# Patient Record
Sex: Male | Born: 1975
Health system: Southern US, Community
[De-identification: ages and names within clinical notes are randomized; demographics above are authoritative.]

## PROBLEM LIST (undated history)

## (undated) DIAGNOSIS — Z87442 Personal history of urinary calculi: Secondary | ICD-10-CM

## (undated) DIAGNOSIS — K219 Gastro-esophageal reflux disease without esophagitis: Secondary | ICD-10-CM

## (undated) DIAGNOSIS — E119 Type 2 diabetes mellitus without complications: Secondary | ICD-10-CM

## (undated) DIAGNOSIS — I10 Essential (primary) hypertension: Secondary | ICD-10-CM

## (undated) DIAGNOSIS — I471 Supraventricular tachycardia: Secondary | ICD-10-CM

## (undated) DIAGNOSIS — I2510 Atherosclerotic heart disease of native coronary artery without angina pectoris: Secondary | ICD-10-CM

## (undated) DIAGNOSIS — E785 Hyperlipidemia, unspecified: Secondary | ICD-10-CM

## (undated) HISTORY — PX: CARDIAC SURGERY: SHX584

## (undated) HISTORY — PX: LAPAROSCOPIC CHOLECYSTECTOMY: SUR755

---

## 2001-10-26 ENCOUNTER — Emergency Department (HOSPITAL_COMMUNITY): Admission: EM | Admit: 2001-10-26 | Discharge: 2001-10-26 | Payer: Self-pay | Admitting: Emergency Medicine

## 2005-07-03 ENCOUNTER — Emergency Department (HOSPITAL_COMMUNITY): Admission: EM | Admit: 2005-07-03 | Discharge: 2005-07-03 | Payer: Self-pay | Admitting: Emergency Medicine

## 2009-10-04 ENCOUNTER — Emergency Department (HOSPITAL_COMMUNITY): Admission: EM | Admit: 2009-10-04 | Discharge: 2009-10-04 | Payer: Self-pay | Admitting: Emergency Medicine

## 2010-10-08 ENCOUNTER — Emergency Department (HOSPITAL_COMMUNITY)
Admission: EM | Admit: 2010-10-08 | Discharge: 2010-10-08 | Payer: Self-pay | Source: Home / Self Care | Admitting: Emergency Medicine

## 2010-12-16 LAB — URINALYSIS, ROUTINE W REFLEX MICROSCOPIC
Nitrite: NEGATIVE
Specific Gravity, Urine: >1.03 — ABNORMAL HIGH (ref 1.005–1.030)
Urobilinogen, UA: 0.2 mg/dL (ref 0.0–1.0)

## 2010-12-16 LAB — URINE MICROSCOPIC-ADD ON

## 2011-11-30 ENCOUNTER — Encounter (HOSPITAL_COMMUNITY): Payer: Self-pay

## 2011-11-30 ENCOUNTER — Other Ambulatory Visit: Payer: Self-pay

## 2011-11-30 ENCOUNTER — Emergency Department (HOSPITAL_COMMUNITY)
Admission: EM | Admit: 2011-11-30 | Discharge: 2011-11-30 | Disposition: A | Discharge status: Home / Self Care | Payer: Self-pay | Attending: Emergency Medicine | Admitting: Emergency Medicine

## 2011-11-30 DIAGNOSIS — K0889 Other specified disorders of teeth and supporting structures: Secondary | ICD-10-CM

## 2011-11-30 DIAGNOSIS — I10 Essential (primary) hypertension: Secondary | ICD-10-CM | POA: Insufficient documentation

## 2011-11-30 DIAGNOSIS — R079 Chest pain, unspecified: Secondary | ICD-10-CM | POA: Insufficient documentation

## 2011-11-30 DIAGNOSIS — I498 Other specified cardiac arrhythmias: Secondary | ICD-10-CM | POA: Insufficient documentation

## 2011-11-30 DIAGNOSIS — I471 Supraventricular tachycardia: Secondary | ICD-10-CM

## 2011-11-30 DIAGNOSIS — R002 Palpitations: Secondary | ICD-10-CM | POA: Insufficient documentation

## 2011-11-30 DIAGNOSIS — K089 Disorder of teeth and supporting structures, unspecified: Secondary | ICD-10-CM | POA: Insufficient documentation

## 2011-11-30 DIAGNOSIS — F17200 Nicotine dependence, unspecified, uncomplicated: Secondary | ICD-10-CM | POA: Insufficient documentation

## 2011-11-30 LAB — DIFFERENTIAL
Basophils Absolute: 0 10*3/uL (ref 0.0–0.1)
Basophils Relative: 0 % (ref 0–1)
Monocytes Absolute: 1.2 10*3/uL — ABNORMAL HIGH (ref 0.1–1.0)

## 2011-11-30 LAB — CBC
MCH: 30.6 pg (ref 26.0–34.0)
RBC: 5.55 MIL/uL (ref 4.22–5.81)
RDW: 13.1 % (ref 11.5–15.5)

## 2011-11-30 LAB — BASIC METABOLIC PANEL
BUN: 14 mg/dL (ref 6–23)
CO2: 27 mEq/L (ref 19–32)
Calcium: 10 mg/dL (ref 8.4–10.5)
Chloride: 101 mEq/L (ref 96–112)
Creatinine, Ser: 1.12 mg/dL (ref 0.50–1.35)
Glucose, Bld: 136 mg/dL — ABNORMAL HIGH (ref 70–99)
Potassium: 4 mEq/L (ref 3.5–5.1)

## 2011-11-30 LAB — TSH: TSH: 2.23 u[IU]/mL (ref 0.350–4.500)

## 2011-11-30 LAB — POCT I-STAT TROPONIN I: Troponin i, poc: 0.01 ng/mL (ref 0.00–0.08)

## 2011-11-30 MED ORDER — ADENOSINE 6 MG/2ML IV SOLN
6.0000 mg | Freq: Once | INTRAVENOUS | Status: AC
  Administered 2011-11-30: 6 mg via INTRAVENOUS

## 2011-11-30 MED ORDER — HYDROCODONE-ACETAMINOPHEN 5-500 MG PO TABS: 1.0000 | ORAL_TABLET | Freq: Four times a day (QID) | ORAL | Status: AC | PRN

## 2011-11-30 MED ORDER — ERYTHROMYCIN BASE 250 MG PO TABS: 500.0000 mg | ORAL_TABLET | Freq: Two times a day (BID) | ORAL | Status: AC

## 2011-11-30 MED ORDER — ADENOSINE 6 MG/2ML IV SOLN
INTRAVENOUS | Status: AC
  Filled 2011-11-30: qty 4

## 2011-11-30 MED ORDER — ADENOSINE 6 MG/2ML IV SOLN
INTRAVENOUS | Status: AC
  Filled 2011-11-30: qty 2

## 2011-11-30 MED ORDER — ADENOSINE 6 MG/2ML IV SOLN
12.0000 mg | Freq: Once | INTRAVENOUS | Status: AC
  Administered 2011-11-30: 12 mg via INTRAVENOUS

## 2011-11-30 NOTE — ED Provider Notes (Signed)
History   This chart was scribed for Geoffery Lyons, MD by Clarita Crane. The patient was seen in room APA04/APA04. Patient's care was started at [phone].    CSN: [id number]  Arrival date & time 11/30/11  1191   First MD Initiated Contact with Patient 11/30/11 [phone]      Chief Complaint  Patient presents with  . Chest Pain    (Consider location/radiation/quality/duration/timing/severity/associated sxs/prior treatment) HPI Lance [name] is a 36 y.o. male who presents to the Emergency Department complaining of constant moderate to severe palpitations onset this morning upon awaking and persistent since with no associated symptoms. Denies previous history of similar symptoms. Denies chest pain, SOB, diaphoresis, nausea, vomiting, fever. Denies history of arrythmia and previous cardiac problems.  Past Medical History  Diagnosis Date  . Hypertension     History reviewed. No pertinent past surgical history.  No family history on file.  History  Substance Use Topics  . Smoking status: Current Everyday Smoker    Types: Cigarettes  . Smokeless tobacco: Not on file  . Alcohol Use: No      Review of Systems 10 Systems reviewed and are negative for acute change except as noted in the HPI.  Allergies  Penicillins  Home Medications  No current outpatient prescriptions on file.  BP 204/114  Pulse 161  Temp(Src) 98.4 F (36.9 C) (Oral)  Resp 22  Ht 5\' 11"  (1.803 m)  Wt 265 lb (120.203 kg)  BMI 36.96 kg/m2  SpO2 99%  Physical Exam  Nursing note and vitals reviewed. Constitutional: He is oriented to person, place, and time. He appears well-developed and well-nourished. No distress.  HENT:  Head: Normocephalic and atraumatic.  Eyes: EOM are normal. Pupils are equal, round, and reactive to light.  Neck: Neck supple. No tracheal deviation present.  Cardiovascular: Regular rhythm.  Tachycardia present.  Exam reveals no gallop and no friction rub.   No murmur  heard. Pulmonary/Chest: Effort normal. No respiratory distress. He has no wheezes. He has no rales.  Abdominal: Soft. He exhibits no distension.  Musculoskeletal: Normal range of motion. He exhibits no edema.  Neurological: He is alert and oriented to person, place, and time. No sensory deficit.  Skin: Skin is warm and dry.  Psychiatric: He has a normal mood and affect. His behavior is normal.    ED Course  Procedures (including critical care time)  DIAGNOSTIC STUDIES: Oxygen Saturation is 98% on room air, normal by my interpretation.    COORDINATION OF CARE: 7:02AM- 1st dose off adenosine-6mg  administered. Patient still presents in SVT following administration.  7:06AM- Second dose of Adenosine (12mg ) administered. Patient's heart rate decreased following administration.   Results for orders placed during the hospital encounter of 11/30/11  CBC      Component Value Range   WBC 14.4 (*) 4.0 - 10.5 (K/uL)   RBC 5.55  4.22 - 5.81 (MIL/uL)   Hemoglobin 17.0  13.0 - 17.0 (g/dL)   HCT 95.6  21.3 - 08.6 (%)   MCV 88.6  78.0 - 100.0 (fL)   MCH 30.6  26.0 - 34.0 (pg)   MCHC 34.6  30.0 - 36.0 (g/dL)   RDW 57.8  46.9 - 62.9 (%)   Platelets 263  150 - 400 (K/uL)  DIFFERENTIAL      Component Value Range   Neutrophils Relative 54  43 - 77 (%)   Lymphocytes Relative 34  12 - 46 (%)   Monocytes Relative 8  3 - 12 (%)  Eosinophils Relative 4  0 - 5 (%)   Basophils Relative 0  0 - 1 (%)   Neutro Abs 7.7  1.7 - 7.7 (K/uL)   Lymphs Abs 4.9 (*) 0.7 - 4.0 (K/uL)   Monocytes Absolute 1.2 (*) 0.1 - 1.0 (K/uL)   Eosinophils Absolute 0.6  0.0 - 0.7 (K/uL)   Basophils Absolute 0.0  0.0 - 0.1 (K/uL)   WBC Morphology WHITE COUNT CONFIRMED ON SMEAR    BASIC METABOLIC PANEL      Component Value Range   Sodium 139  135 - 145 (mEq/L)   Potassium 4.0  3.5 - 5.1 (mEq/L)   Chloride 101  96 - 112 (mEq/L)   CO2 27  19 - 32 (mEq/L)   Glucose, Bld 136 (*) 70 - 99 (mg/dL)   BUN 14  6 - 23 (mg/dL)    Creatinine, Ser 1.47  0.50 - 1.35 (mg/dL)   Calcium 82.9  8.4 - 10.5 (mg/dL)   GFR calc non Af Amer 84 (*) >90 (mL/min)   GFR calc Af Amer >90  >90 (mL/min)  POCT I-STAT TROPONIN I      Component Value Range   Troponin i, poc 0.01  0.00 - 0.08 (ng/mL)   Comment 3             No results found.   1. Supraventricular tachycardia       MDM  The patient presents to the ED with palpitations and was found to be in SVT.  He converted to nsr with a second dose of adenosine.  His labs returned unremarkable and was feeling better.  He remained in a sinus rhythm and will be discharged to home with cardiology follow up.  He reports to me that he drinks sweet tea and soda all day long and has been eating multiple chocolate bars which he tells me is a remedy for a toothache he has been having.  I have advised him to watch his caffeine intake.  CRITICAL CARE Performed by: Geoffery Lyons   Total critical care time: 30 minutes  Critical care time was exclusive of separately billable procedures and treating other patients.  Critical care was necessary to treat or prevent imminent or life-threatening deterioration.  Critical care was time spent personally by me on the following activities: development of treatment plan with patient and/or surrogate as well as nursing, discussions with consultants, evaluation of patient's response to treatment, examination of patient, obtaining history from patient or surrogate, ordering and performing treatments and interventions, ordering and review of laboratory studies, ordering and review of radiographic studies, pulse oximetry and re-evaluation of patient's condition.   I personally performed the services described in this documentation, which was scribed in my presence. The recorded information has been reviewed and considered.     Geoffery Lyons, MD 12/01/11 [phone]

## 2011-11-30 NOTE — ED Notes (Signed)
Pt stated he has been eating a lot of candy lately for multiple toothaches, this am around 6 he woke and felt heart racing, pt placed on monitor and hr 160's noted.  md to bedside for eval.

## 2011-11-30 NOTE — ED Notes (Signed)
Pt now resting in bed, voices no complaints, hr 99 after meds

## 2011-11-30 NOTE — ED Notes (Signed)
Pt reports heart racing/?cp that started this am,  Denies any n/v, describes as "heart racing"

## 2011-11-30 NOTE — Discharge Instructions (Signed)
Supraventricular Tachycardia    Supraventricular tachycardia (SVT) is an abnormal heart rhythm (arrhythmia) that causes the heart to beat very fast (tachycardia). This kind of fast heart beat originates in the upper chambers of the heart (atria). SVT can cause the heart to beat greater than 100 beats per minute. SVT can have a rapid burst of heartbeats. This can start and stop suddenly without warning and is called non-sustained. SVT can also be sustained, in which the heart beats at a continuous fast rate.   CAUSES   There can be different causes of SVT. Some of these include:  · Heart valve problems such as mitral valve prolapse.   · An enlarged heart (hypertrophic cardiomyopathy).   · Congenital heart problems.   · Heart inflammation (pericarditis).   · Hyperthyroidism.   · Low potassium or magnesium levels.   · Caffeine.   · Drug use such as cocaine, methamphetamines, or stimulants.   · Some over-the-counter medications such as:   · Decongestants.   · Diet medications.   · Herbal medications.   SYMPTOMS   Symptoms of SVT can vary. Symptoms depend on if the SVT is sustained or non-sustained. These can include:  · No symptoms (asymptomatic).   · An awareness of your heart beating rapidly (palpitations).   · Shortness of breath.   · Chest pain or pressure.   · If your blood pressure drops because of the SVT, you may experience:   · Fainting or near fainting.   · Weakness.   · Dizziness.   DIAGNOSIS   Different tests can be performed to diagnose SVT, such as:  · An electrocardiogram (EKG) is a painless test that records the electrical activity of your heart.   · Holter monitor. This is a 24 hour recording of your heart rhythm. You will be given a diary. Write down all symptoms that you have and what you were doing at the time you experienced symptoms.   · Arrhythmia monitor. This is a small device that your wear for several weeks. It records the heart rhythm when you have symptoms.   · Echocardiogram. This is an  imaging test to help detect abnormal heart structure such as congenital abnormalities, heart valve problems, or if your heart is enlarged.   · Stress Test. This test can help determine if the SVT is related to exercise.   · Electrophysiology Study (EPS). This is a procedure that evaluates your heart's electrical system and can help your caregiver find the cause of SVT.   TREATMENT   Treatment of SVT depends on the symptoms, how often it recurs and whether there are any underlying heart problems.   · If symptoms are rare and no other cardiac disease is present, no treatment may be needed.   · Blood work may be done to check potassium, magnesium and thyroid hormone levels to see if they are abnormal. If these levels are abnormal, treatment to correct the problems will occur.   Medications:  Your caregiver may use oral medications to treat SVT. These medications are given for long-term control of SVT. Medications may be used alone or in combination with other treatments. These medications work to slow nerve impulses in the heart muscle. These medications can also be used to treat high blood pressure. Some of these medications may include:  · Calcium channel blockers.   · Beta blockers.   · Lanoxin.   Nonsurgical procedures:  Nonsurgical techniques may be used if oral medications do not work. Some   examples include:  · Cardioversion. This technique uses either drugs or an electrical shock to restore a normal heart rhythm.   · Cardioversion drugs may be given through an intravenous (IV) line to help "reset" the heart rhythm.   · In electrical cardio version, the doctor shocks your heart to stop its beat for a split second. This helps to reset the heart to a normal rhythm.   · Ablation. This procedure is done under mild sedation. High frequency radio-wave energy is used to destroy the area of heart tissue responsible for the SVT.   HOME CARE INSTRUCTIONS   · Do not smoke.   · Only take medications prescribed by your  caregiver. Check with your caregiver before using over-the-counter medications.   · Check with your caregiver as to how much alcohol and caffeine (coffee, tea, colas, or chocolate) you may have.   · It is very important to keep all follow-up referrals and appointments in order to properly manage this problem.   SEEK IMMEDIATE MEDICAL CARE IF:  · You have dizziness.   · You faint or nearly faint.   · You have shortness of breath.   · You have chest pain or pressure.   · You have sudden nausea or vomiting.   · You have profuse sweating.   · You are concerned about how long your symptoms last.   · You are concerned about the frequency of your SVT episodes.   If you have the above symptoms, call your local emergency service immediately! Do not drive yourself to the hospital.  MAKE SURE YOU:   · Understand these instructions.   · Will watch your condition.   · Will get help right away if you are not doing well or get worse.   Document Released: 09/22/2005 Document Revised: 04/07/2011 Document Reviewed: 01/04/2009  [employer]® Patient Information ©2012 [employer].

## 2012-01-26 ENCOUNTER — Encounter (HOSPITAL_COMMUNITY): Payer: Self-pay | Admitting: *Deleted

## 2012-01-26 ENCOUNTER — Emergency Department (HOSPITAL_COMMUNITY): Payer: Self-pay

## 2012-01-26 ENCOUNTER — Emergency Department (HOSPITAL_COMMUNITY)
Admission: EM | Admit: 2012-01-26 | Discharge: 2012-01-26 | Disposition: A | Discharge status: Home / Self Care | Payer: Self-pay | Attending: Emergency Medicine | Admitting: Emergency Medicine

## 2012-01-26 DIAGNOSIS — H53149 Visual discomfort, unspecified: Secondary | ICD-10-CM | POA: Insufficient documentation

## 2012-01-26 DIAGNOSIS — R51 Headache: Secondary | ICD-10-CM | POA: Insufficient documentation

## 2012-01-26 DIAGNOSIS — J329 Chronic sinusitis, unspecified: Secondary | ICD-10-CM | POA: Insufficient documentation

## 2012-01-26 DIAGNOSIS — I10 Essential (primary) hypertension: Secondary | ICD-10-CM | POA: Insufficient documentation

## 2012-01-26 DIAGNOSIS — R0602 Shortness of breath: Secondary | ICD-10-CM | POA: Insufficient documentation

## 2012-01-26 DIAGNOSIS — R112 Nausea with vomiting, unspecified: Secondary | ICD-10-CM | POA: Insufficient documentation

## 2012-01-26 DIAGNOSIS — H538 Other visual disturbances: Secondary | ICD-10-CM | POA: Insufficient documentation

## 2012-01-26 LAB — CBC
Hemoglobin: 15.7 g/dL (ref 13.0–17.0)
MCH: 30.3 pg (ref 26.0–34.0)
MCV: 89.2 fL (ref 78.0–100.0)
RBC: 5.18 MIL/uL (ref 4.22–5.81)
RDW: 13 % (ref 11.5–15.5)
WBC: 14.1 10*3/uL — ABNORMAL HIGH (ref 4.0–10.5)

## 2012-01-26 LAB — BASIC METABOLIC PANEL
BUN: 13 mg/dL (ref 6–23)
CO2: 31 mEq/L (ref 19–32)
GFR calc Af Amer: >90 mL/min (ref >90)
GFR calc non Af Amer: 88 mL/min — ABNORMAL LOW (ref >90)
Potassium: 4.3 mEq/L (ref 3.5–5.1)
Sodium: 137 mEq/L (ref 135–145)

## 2012-01-26 MED ORDER — SODIUM CHLORIDE 0.9 % IV BOLUS (SEPSIS)
250.0000 mL | Freq: Once | INTRAVENOUS | Status: AC
  Administered 2012-01-26: 250 mL via INTRAVENOUS

## 2012-01-26 MED ORDER — DIPHENHYDRAMINE HCL 50 MG/ML IJ SOLN
25.0000 mg | Freq: Once | INTRAMUSCULAR | Status: AC
  Administered 2012-01-26: 25 mg via INTRAVENOUS
  Filled 2012-01-26: qty 1

## 2012-01-26 MED ORDER — DEXAMETHASONE SODIUM PHOSPHATE 4 MG/ML IJ SOLN
10.0000 mg | Freq: Once | INTRAMUSCULAR | Status: AC
  Administered 2012-01-26: 10 mg via INTRAVENOUS
  Filled 2012-01-26: qty 3

## 2012-01-26 MED ORDER — SODIUM CHLORIDE 0.9 % IV SOLN: INTRAVENOUS | Status: DC

## 2012-01-26 MED ORDER — PROMETHAZINE HCL 25 MG/ML IJ SOLN
12.5000 mg | Freq: Once | INTRAMUSCULAR | Status: AC
  Administered 2012-01-26: 12.5 mg via INTRAVENOUS
  Filled 2012-01-26: qty 1

## 2012-01-26 NOTE — ED Notes (Signed)
Headache, no injury,  Had blurred vision from 830 am to app 12 noon,

## 2012-01-26 NOTE — Discharge Instructions (Signed)
Resource guide provided to help you find a primary care Dr. Here your blood pressure was slightly elevated when you're feeling better it important to have it rechecked. Particularly since she had a past history of hypertension and was supposed to be on medication many years ago. If headache recurs or gets worse or new symptoms develop they will be important for you to have an MRI we are able to do that here Monday through Friday during normal working hours if necessary.  Return for any new or worse symptoms.   RESOURCE GUIDE  Dental Problems  Patients with Medicaid: [facility] [phone] [address]                                           [phone] [address] Phone:  [phone]                                                  Phone:  [phone]  If unable to pay or uninsured, contact:  [facility] or [facility]. to become qualified for the adult dental clinic.  Chronic Pain Problems Contact [facility]  [phone] Patients need to be referred by their primary care doctor.  Insufficient Money for Medicine Contact [employer]:  call "211" or [facility] 3235507018.  No Primary Care Doctor Call [facility]  [phone] Other agencies that provide inexpensive medical care    [facility]  [phone]    [facility]  [phone]    [facility]  [phone]    [facility]  [phone]    [facility]  [phone]    [facility]  [phone]  Psychological Services [facility]  [phone] [facility]  [phone] [facility]   6064533769 (emergency services [phone])  Substance Abuse Resources [facility]  [phone] [facility] [phone] [facility] [place] [phone] Floydene Flock [phone] Residential & Outpatient Substance Abuse Program  [phone]  Abuse/Neglect [facility] Child Abuse Hotline 629-240-7466 [facility] Child Abuse Hotline 340-763-3775 (After Hours)  Emergency Shelter [facility] ([phone]  [facility] [place] [facility] [place] 5042654406 [facility] 8202120867  MRSA Hotline #:   612-137-8522    [facility] Resources  [facility] [place]     [employer]                          [facility] Dept. [address] [place]                       [address]      [address]  Patrecia Pace  [facility] Phone:  [phone]                                   Phone:  [phone]                 Phone:  [phone]  [facility] Phone:  [phone]  [facility] Child Abuse Hotline 425-184-4771 [phone] (After Hours)

## 2012-01-26 NOTE — ED Provider Notes (Signed)
History   This chart was scribed for Lance Jakes, MD by Clarita Crane. The patient was seen in room APFT20/APFT20. Patient's care was started at 1346.    CSN: [id number]  Arrival date & time 01/26/12  1346   First MD Initiated Contact with Patient 01/26/12 1507      Chief Complaint  Patient presents with  . Headache    (Consider location/radiation/quality/duration/timing/severity/associated sxs/prior treatment) HPI Lance [name] is a 36 y.o. male who presents to the Emergency Department complaining of constant moderate to severe throbbing HA localized to region behind right eye onset 5 hours ago and persistent since with associated blurred vision from left eye, photophobia, a brief episode of SOB, nausea and vomiting x1. Patient rates HA an 8 out of 10 currently. Reports blurred vision started approximately 7 hours ago and lasted 2 hours before resolving with onset of HA. Denies vomiting, fever, sore throat, chest pain, abdominal pain, back pain, cough, dysuria, swelling of lower extremities, diarrhea and head injury. Patient seasonal allergies, HTN and irregular heart beat and denies previous history of HAs and family h/o HAs.  No PCP  Past Medical History  Diagnosis Date  . Hypertension   . Irregular heart beat     History reviewed. No pertinent past surgical history.  History reviewed. No pertinent family history.  History  Substance Use Topics  . Smoking status: Current Everyday Smoker    Types: Cigarettes  . Smokeless tobacco: Not on file  . Alcohol Use: No      Review of Systems  Constitutional: Negative for fever and chills.  HENT: Negative for rhinorrhea and neck pain.   Eyes: Positive for photophobia and visual disturbance. Negative for pain.  Respiratory: Positive for shortness of breath. Negative for cough.   Cardiovascular: Negative for chest pain.  Gastrointestinal: Positive for nausea and vomiting. Negative for abdominal pain and diarrhea.    Genitourinary: Negative for dysuria.  Musculoskeletal: Negative for back pain.  Skin: Negative for rash.  Neurological: Positive for headaches. Negative for dizziness and weakness.    Allergies  Bee venom and Penicillins  Home Medications  No current outpatient prescriptions on file.  BP 159/92  Pulse 77  Temp(Src) 97.9 F (36.6 C) (Oral)  Resp 18  Ht 5\' 11"  (1.803 m)  Wt 240 lb (108.863 kg)  BMI 33.47 kg/m2  SpO2 100%  Physical Exam  Nursing note and vitals reviewed. Constitutional: He is oriented to person, place, and time. He appears well-developed and well-nourished. No distress.  HENT:  Head: Normocephalic and atraumatic.  Eyes: EOM are normal. Pupils are equal, round, and reactive to light.  Neck: Neck supple. No tracheal deviation present.  Cardiovascular: Normal rate and regular rhythm.  Exam reveals no gallop and no friction rub.   No murmur heard. Pulmonary/Chest: Effort normal. No respiratory distress. He has no wheezes. He has no rales.  Abdominal: Soft. Bowel sounds are normal. He exhibits no distension. There is no tenderness.  Musculoskeletal: Normal range of motion. He exhibits no edema.  Neurological: He is alert and oriented to person, place, and time. No cranial nerve deficit or sensory deficit.       No pronator drift. Upper extremity strength normal.  Skin: Skin is warm and dry.  Psychiatric: He has a normal mood and affect. His behavior is normal.    ED Course  Procedures (including critical care time)  DIAGNOSTIC STUDIES: Oxygen Saturation is 100% on room air, normal by my interpretation.    COORDINATION OF  CARE: 3:20PM- Patient informed of current plan for treatment and evaluation and agrees with plan at this time.  5:47PM- Patient reports he is feeling better at this time although HA still persists.      Labs Reviewed  CBC - Abnormal; Notable for the following:    WBC 14.1 (*)    All other components within normal limits  BASIC  METABOLIC PANEL - Abnormal; Notable for the following:    Glucose, Bld 115 (*)    GFR calc non Af Amer 88 (*)    All other components within normal limits   Ct Head Wo Contrast  01/26/2012  *RADIOLOGY REPORT*  Clinical Data: Headache.  Blurred vision.  CT HEAD WITHOUT CONTRAST  Technique:  Contiguous axial images were obtained from the base of the skull through the vertex without contrast.  Comparison: 07/03/2005  Findings: The brain has a normal appearance without evidence of malformation, atrophy, old or acute infarction, mass lesion, hemorrhage, hydrocephalus or extra-axial collection.  The calvarium is unremarkable.  There is chronic opacification of the left maxillary sinus.  There are inflammatory changes of the ethmoid and right maxillary sinus.  IMPRESSION: Normal appearance of the brain.  Inflammatory sinus disease.  Original Report Authenticated By: Thomasenia Sales, M.D.   Results for orders placed during the hospital encounter of 01/26/12  CBC      Component Value Range   WBC 14.1 (*) 4.0 - 10.5 (K/uL)   RBC 5.18  4.22 - 5.81 (MIL/uL)   Hemoglobin 15.7  13.0 - 17.0 (g/dL)   HCT 16.1  09.6 - 04.5 (%)   MCV 89.2  78.0 - 100.0 (fL)   MCH 30.3  26.0 - 34.0 (pg)   MCHC 34.0  30.0 - 36.0 (g/dL)   RDW 40.9  81.1 - 91.4 (%)   Platelets 185  150 - 400 (K/uL)  BASIC METABOLIC PANEL      Component Value Range   Sodium 137  135 - 145 (mEq/L)   Potassium 4.3  3.5 - 5.1 (mEq/L)   Chloride 99  96 - 112 (mEq/L)   CO2 31  19 - 32 (mEq/L)   Glucose, Bld 115 (*) 70 - 99 (mg/dL)   BUN 13  6 - 23 (mg/dL)   Creatinine, Ser 7.82  0.50 - 1.35 (mg/dL)   Calcium 9.9  8.4 - 95.6 (mg/dL)   GFR calc non Af Amer 88 (*) >90 (mL/min)   GFR calc Af Amer >90  >90 (mL/min)     1. Headache   2. Chronic sinusitis       MDM  Headaches responding to migraine cocktail however patient also does have 3 significant chronic sinusitis. Symptoms not consistent with acute subarachnoid hemorrhage. Will discharge  home with rest but the rest of migraine cocktail continue to work. Patient will need MRI if symptoms recur or persist. Also blood pressure here is mildly elevated at 159/92. Not significant at this point in time however patient states that he was told in the past he has hypertension need to be on medication this was about 6 years ago. Will have outpatient followup resource guide provided for him to find a primary care Dr.  At discharge patient snig. improved tolerating light fine moving around fine no acute distress nontoxic.      I personally performed the services described in this documentation, which was scribed in my presence. The recorded information has been reviewed and considered.     Lance Jakes, MD 01/26/12 [phone]

## 2012-01-26 NOTE — ED Notes (Signed)
Patient transported to CT. NAD noted. Meds given. IV fluids running.

## 2012-11-12 ENCOUNTER — Other Ambulatory Visit: Payer: Self-pay

## 2012-11-12 ENCOUNTER — Emergency Department (HOSPITAL_COMMUNITY)
Admission: EM | Admit: 2012-11-12 | Discharge: 2012-11-12 | Disposition: A | Discharge status: Home / Self Care | Payer: Self-pay | Attending: Emergency Medicine | Admitting: Emergency Medicine

## 2012-11-12 ENCOUNTER — Encounter (HOSPITAL_COMMUNITY): Payer: Self-pay | Admitting: *Deleted

## 2012-11-12 ENCOUNTER — Emergency Department (HOSPITAL_COMMUNITY): Payer: Self-pay

## 2012-11-12 DIAGNOSIS — F17200 Nicotine dependence, unspecified, uncomplicated: Secondary | ICD-10-CM | POA: Insufficient documentation

## 2012-11-12 DIAGNOSIS — R0789 Other chest pain: Secondary | ICD-10-CM | POA: Insufficient documentation

## 2012-11-12 DIAGNOSIS — H538 Other visual disturbances: Secondary | ICD-10-CM | POA: Insufficient documentation

## 2012-11-12 DIAGNOSIS — I10 Essential (primary) hypertension: Secondary | ICD-10-CM | POA: Insufficient documentation

## 2012-11-12 DIAGNOSIS — R0602 Shortness of breath: Secondary | ICD-10-CM | POA: Insufficient documentation

## 2012-11-12 DIAGNOSIS — Z8679 Personal history of other diseases of the circulatory system: Secondary | ICD-10-CM | POA: Insufficient documentation

## 2012-11-12 DIAGNOSIS — R079 Chest pain, unspecified: Secondary | ICD-10-CM

## 2012-11-12 LAB — CBC WITH DIFFERENTIAL/PLATELET
Eosinophils Absolute: 0.4 10*3/uL (ref 0.0–0.7)
Hemoglobin: 16.6 g/dL (ref 13.0–17.0)
Lymphocytes Relative: 37 % (ref 12–46)
Lymphs Abs: 4.8 10*3/uL — ABNORMAL HIGH (ref 0.7–4.0)
MCH: 31 pg (ref 26.0–34.0)
MCV: 88.2 fL (ref 78.0–100.0)
Monocytes Relative: 8 % (ref 3–12)
Neutrophils Relative %: 51 % (ref 43–77)
RBC: 5.36 MIL/uL (ref 4.22–5.81)
WBC: 12.8 10*3/uL — ABNORMAL HIGH (ref 4.0–10.5)

## 2012-11-12 LAB — COMPREHENSIVE METABOLIC PANEL
ALT: 35 U/L (ref 0–53)
Alkaline Phosphatase: 96 U/L (ref 39–117)
BUN: 14 mg/dL (ref 6–23)
CO2: 27 mEq/L (ref 19–32)
Chloride: 96 mEq/L (ref 96–112)
GFR calc Af Amer: >90 mL/min (ref >90)
GFR calc non Af Amer: 82 mL/min — ABNORMAL LOW (ref >90)
Glucose, Bld: 160 mg/dL — ABNORMAL HIGH (ref 70–99)
Potassium: 3.3 mEq/L — ABNORMAL LOW (ref 3.5–5.1)
Total Bilirubin: 0.3 mg/dL (ref 0.3–1.2)
Total Protein: 7.2 g/dL (ref 6.0–8.3)

## 2012-11-12 LAB — TROPONIN I: Troponin I: <0.3 ng/mL (ref <0.30)

## 2012-11-12 MED ORDER — METOPROLOL TARTRATE 50 MG PO TABS: 50.0000 mg | ORAL_TABLET | Freq: Every day | ORAL | Status: DC

## 2012-11-12 MED ORDER — ASPIRIN 325 MG PO TABS
325.0000 mg | ORAL_TABLET | Freq: Once | ORAL | Status: AC
  Administered 2012-11-12: 325 mg via ORAL
  Filled 2012-11-12: qty 1

## 2012-11-12 NOTE — ED Notes (Signed)
Chest "tightness" since 430.p.  Has not taken bp meds for 2 years.  Seen here recently for rapid heart rate.

## 2012-11-12 NOTE — ED Provider Notes (Signed)
History     CSN: [id number]  Arrival date & time 11/12/12  1918   First MD Initiated Contact with Patient 11/12/12 1943      Chief Complaint  Patient presents with  . Chest Pain    Patient is a 37 y.o. male presenting with chest pain. The history is provided by the patient.  Chest Pain The chest pain began 3 - 5 hours ago. Chest pain occurs constantly. The chest pain is resolved. Associated with: nothing. The severity of the pain is moderate. The quality of the pain is described as pressure-like. The pain does not radiate. Primary symptoms include shortness of breath. Pertinent negatives for primary symptoms include no fever, no syncope, no cough, no abdominal pain and no vomiting.  Pertinent negatives for associated symptoms include no diaphoresis and no weakness. He tried nothing for the symptoms.   pt reports that after work he started to have blurred vision (he reports this will occur with his HTN) but he denies visual loss or floaters.  He reports soon after he developed chest tightness and SOB.  No diaphoresis/nausea.  He is now feeling improved. No back pain.  No abd pain.  No focal weakness.  He has no h/o CAD, but does have h/o HTN.  He also had episode of SVT previously but none recently.  No syncope is reported.  Family history - + for CAD Social history -smoker  Past Medical History  Diagnosis Date  . Hypertension   . Irregular heart beat     History reviewed. No pertinent past surgical history.  History reviewed. No pertinent family history.  History  Substance Use Topics  . Smoking status: Current Every Day Smoker    Types: Cigarettes  . Smokeless tobacco: Not on file  . Alcohol Use: No      Review of Systems  Constitutional: Negative for fever and diaphoresis.  Respiratory: Positive for shortness of breath. Negative for cough.   Cardiovascular: Positive for chest pain. Negative for syncope.  Gastrointestinal: Negative for vomiting and abdominal pain.   Musculoskeletal: Negative for back pain.  Neurological: Negative for syncope, facial asymmetry, weakness and headaches.  Psychiatric/Behavioral: Negative for agitation.  All other systems reviewed and are negative.    Allergies  Bee venom and Penicillins  Home Medications  No current outpatient prescriptions on file.  BP 192/99  Pulse 95  Temp 97.9 F (36.6 C) (Oral)  Resp 18  Ht 5\' 11"  (1.803 m)  Wt 245 lb (111.131 kg)  BMI 34.17 kg/m2  SpO2 98%  Physical Exam CONSTITUTIONAL: Well developed/well nourished, comfortable appearing, watching TV HEAD AND FACE: Normocephalic/atraumatic EYES: EOMI/PERRL ENMT: Mucous membranes moist NECK: supple no meningeal signs SPINE:entire spine nontender CV: S1/S2 noted, no murmurs/rubs/gallops noted LUNGS: Lungs are clear to auscultation bilaterally, no apparent distress ABDOMEN: soft, nontender, no rebound or guarding GU:no cva tenderness NEURO: Pt is awake/alert, moves all extremitiesx4 EXTREMITIES: pulses normal, full ROM, no calf tenderness SKIN: warm, color normal PSYCH: no abnormalities of mood noted  ED Course  Procedures   10:12 PM Pt with one episode of CP earlier tonight that is now resolved.  He has not had this recently.  He did have blurred vision earlier but reports that will occur with HTN which is now improved.  Repeat EKG is not completely normal, however no significant ischemic changes.  Doubt PE given his history.  I doubt aortic dissection  At signout to dr Effie Shy, plan is to repeat troponin at 2300, if negative will  be safe for d/c Pt reports he used to take lopressor, will restart this med  MDM  Nursing notes including past medical history and social history reviewed and considered in documentation xrays reviewed and considered Labs/vital reviewed and considered        Date: 11/12/2012  Rate: 76  Rhythm: normal sinus rhythm  QRS Axis: normal  Intervals: normal  ST/T Wave abnormalities: nonspecific  ST changes  Conduction Disutrbances:nonspecific intraventricular conduction delay  Narrative Interpretation:   Old EKG Reviewed: unchanged     Date: 11/12/2012 2148  Rate: 60  Rhythm: normal sinus rhythm  QRS Axis: normal  Intervals: normal  ST/T Wave abnormalities: nonspecific ST changes  Conduction Disutrbances:none  Narrative Interpretation:   Old EKG Reviewed: no significant change from earlier tonight    Joya Gaskins, MD 11/12/12 2214

## 2013-04-20 ENCOUNTER — Emergency Department (HOSPITAL_COMMUNITY)
Admission: EM | Admit: 2013-04-20 | Discharge: 2013-04-20 | Disposition: A | Discharge status: Home / Self Care | Payer: Self-pay | Attending: Emergency Medicine | Admitting: Emergency Medicine

## 2013-04-20 ENCOUNTER — Encounter (HOSPITAL_COMMUNITY): Payer: Self-pay | Admitting: *Deleted

## 2013-04-20 ENCOUNTER — Emergency Department (HOSPITAL_COMMUNITY): Payer: Self-pay

## 2013-04-20 DIAGNOSIS — Z8679 Personal history of other diseases of the circulatory system: Secondary | ICD-10-CM | POA: Insufficient documentation

## 2013-04-20 DIAGNOSIS — I10 Essential (primary) hypertension: Secondary | ICD-10-CM | POA: Insufficient documentation

## 2013-04-20 DIAGNOSIS — R072 Precordial pain: Secondary | ICD-10-CM | POA: Insufficient documentation

## 2013-04-20 DIAGNOSIS — Z880 Allergy status to penicillin: Secondary | ICD-10-CM | POA: Insufficient documentation

## 2013-04-20 DIAGNOSIS — R1013 Epigastric pain: Secondary | ICD-10-CM | POA: Insufficient documentation

## 2013-04-20 DIAGNOSIS — R110 Nausea: Secondary | ICD-10-CM | POA: Insufficient documentation

## 2013-04-20 DIAGNOSIS — F17200 Nicotine dependence, unspecified, uncomplicated: Secondary | ICD-10-CM | POA: Insufficient documentation

## 2013-04-20 LAB — COMPREHENSIVE METABOLIC PANEL
ALT: 36 U/L (ref 0–53)
Albumin: 3.8 g/dL (ref 3.5–5.2)
Alkaline Phosphatase: 88 U/L (ref 39–117)
Calcium: 9.4 mg/dL (ref 8.4–10.5)
GFR calc Af Amer: >90 mL/min (ref >90)
Potassium: 3.8 mEq/L (ref 3.5–5.1)
Sodium: 138 mEq/L (ref 135–145)
Total Protein: 6.9 g/dL (ref 6.0–8.3)

## 2013-04-20 LAB — CBC
MCH: 31.5 pg (ref 26.0–34.0)
MCHC: 35.4 g/dL (ref 30.0–36.0)
Platelets: 191 10*3/uL (ref 150–400)
RDW: 13 % (ref 11.5–15.5)

## 2013-04-20 MED ORDER — ONDANSETRON HCL 4 MG PO TABS: 4.0000 mg | ORAL_TABLET | Freq: Four times a day (QID) | ORAL | Status: DC

## 2013-04-20 MED ORDER — MORPHINE SULFATE 4 MG/ML IJ SOLN: 4.0000 mg | Freq: Once | INTRAMUSCULAR | Status: DC

## 2013-04-20 MED ORDER — FAMOTIDINE 20 MG PO TABS
20.0000 mg | ORAL_TABLET | Freq: Once | ORAL | Status: AC
  Administered 2013-04-20: 20 mg via ORAL
  Filled 2013-04-20: qty 1

## 2013-04-20 MED ORDER — OMEPRAZOLE 20 MG PO CPDR: DELAYED_RELEASE_CAPSULE | ORAL | Status: DC

## 2013-04-20 MED ORDER — ONDANSETRON HCL 4 MG/2ML IJ SOLN: 4.0000 mg | Freq: Once | INTRAMUSCULAR | Status: DC

## 2013-04-20 MED ORDER — GI COCKTAIL ~~LOC~~
30.0000 mL | Freq: Once | ORAL | Status: AC
  Administered 2013-04-20: 30 mL via ORAL
  Filled 2013-04-20: qty 30

## 2013-04-20 NOTE — ED Notes (Signed)
Pt states he noticed he was a little short of breath earlier at work. No distress at this time. Pt states it has since resolved.

## 2013-04-20 NOTE — ED Provider Notes (Signed)
History  This chart was scribed for Ward Givens, MD by Bennett Scrape, ED Scribe. This patient was seen in room APA01/[facility]01 and the patient's care was started at 2:09 PM.  CSN: [id number] Arrival date & time 04/20/13  1252  First MD Initiated Contact with Patient 04/20/13 1331     Chief Complaint  Patient presents with  . Abdominal Pain  . chest discomfort     Patient is a 37 y.o. male presenting with abdominal pain. The history is provided by the patient. No language interpreter was used.  Abdominal Pain This is a recurrent problem. The current episode started more than 1 week ago. The problem occurs constantly. Progression since onset: waxing and waning. Associated symptoms include abdominal pain. Pertinent negatives include no shortness of breath. Nothing aggravates the symptoms. Nothing relieves the symptoms. He has tried nothing for the symptoms.    HPI Comments: Lance [name] is a 37 y.o. male who presents to the Emergency Department complaining of 1.5 - 2 weeks of waxing and waning epigastric abdominal pain described as pressure that radiates into the RUQ and substernal chest. He rates his pain a 5 out of 10 currently and an 8 or 9 out of 10 at its worst. Last episode of severe pain was this morning. He reports one episode of nausea while eating chicken and dumplings today but otherwise denies nausea. He denies any modifying factors. He reports having recurrent episodes of pain in his RUQ for the past 6 months with episodes occuring weeks in between. He has a h/o kidney stones and denies similarities. He reports a h/o cholecystectomies with his mother, brother and sister. He denies emesis, fever, chills as an associated symptom.   He has a h/o HTN and admits that he has been non-complaint with his HTN medication over the past few months. Pt is a current 0.5 ppd everyday smoker but denies alcohol use.  PCP none  Past Medical History  Diagnosis Date  . Hypertension   . Irregular  heart beat    History reviewed. No pertinent past surgical history. No family history on file. History  Substance Use Topics  . Smoking status: Current Every Day Smoker    Types: Cigarettes  . Smokeless tobacco: Not on file  . 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  Respiratory: Negative for shortness of breath.   Gastrointestinal: Positive for nausea and abdominal pain. Negative for vomiting.  All other systems reviewed and are negative.    Allergies  Bee venom and Penicillins  Home Medications   Current Outpatient Rx  Name  Route  Sig  Dispense  Refill  . ibuprofen (ADVIL,MOTRIN) 200 MG tablet   Oral   Take 400 mg by mouth every 6 (six) hours as needed for pain.          Triage Vitals: BP 180/81  Pulse 79  Temp(Src) 98.1 F (36.7 C) (Oral)  Resp 20  Ht 5\' 11"  (1.803 m)  Wt 245 lb (111.131 kg)  BMI 34.19 kg/m2  SpO2 99%  Vital signs normal except hypertension   Physical Exam  Nursing note and vitals reviewed. Constitutional: He is oriented to person, place, and time. He appears well-developed and well-nourished.  Non-toxic appearance. He does not appear ill. No distress.  HENT:  Head: Normocephalic and atraumatic.  Right Ear: External ear normal.  Left Ear: External ear normal.  Nose: Nose normal. No mucosal edema or rhinorrhea.  Mouth/Throat: Oropharynx is  clear and moist and mucous membranes are normal. No dental abscesses or edematous.  Eyes: Conjunctivae and EOM are normal. Pupils are equal, round, and reactive to light.  Neck: Normal range of motion and full passive range of motion without pain. Neck supple.  Cardiovascular: Normal rate, regular rhythm and normal heart sounds.  Exam reveals no gallop and no friction rub.   No murmur heard. Pulmonary/Chest: Effort normal and breath sounds normal. No respiratory distress. He has no wheezes. He has no rhonchi. He has no rales. He exhibits no tenderness and no  crepitus.  Abdominal: Soft. Normal appearance. He exhibits no distension. There is tenderness. There is no rebound and no guarding.    Diminished bowel sounds  Musculoskeletal: Normal range of motion. He exhibits no edema and no tenderness.  Moves all extremities well.   Neurological: He is alert and oriented to person, place, and time. He has normal strength. No cranial nerve deficit.  Skin: Skin is warm, dry and intact. No rash noted. No erythema. No pallor.  Psychiatric: He has a normal mood and affect. His speech is normal and behavior is normal. His mood appears not anxious.    ED Course  Procedures (including critical care time)  Medications  morphine 4 MG/ML injection 4 mg (4 mg Intravenous Not Given 04/20/13 1500)  ondansetron (ZOFRAN) injection 4 mg (4 mg Intravenous Not Given 04/20/13 1501)  gi cocktail (Maalox,Lidocaine,Donnatal) (30 mLs Oral Given 04/20/13 1540)  famotidine (PEPCID) tablet 20 mg (20 mg Oral Given 04/20/13 1540)     DIAGNOSTIC STUDIES: Oxygen Saturation is 99% on room air, normal by my interpretation.    COORDINATION OF CARE: 2:16 PM-Discussed treatment plan which includes labs and [place] with pt at bedside and pt agreed to plan.   15:00 nurse states patient refusing pain medications.  3:59 AM-Pt rechecked and still complains of mild pain after medications given above. Informed pt of normal labs and negative radiology results. Discussed minor changes on EKG that are unchanged from prior one. He denies having any symptoms similar to GERD such as esophogeal burning.Pt offered CT scan of his abdomen, but he would prefer to wait.  Discussed discharge plan of PPI medications. Advised pt to avoid spicy, fried foods. Pt is agreeable to this plan.   Results for orders placed during the hospital encounter of 04/20/13  CBC      Result Value Range   WBC 11.5 (*) 4.0 - 10.5 K/uL   RBC 4.95  4.22 - 5.81 MIL/uL   Hemoglobin 15.6  13.0 - 17.0 g/dL   HCT 16.1  09.6 - 04.5 %    MCV 89.1  78.0 - 100.0 fL   MCH 31.5  26.0 - 34.0 pg   MCHC 35.4  30.0 - 36.0 g/dL   RDW 40.9  81.1 - 91.4 %   Platelets 191  150 - 400 K/uL  COMPREHENSIVE METABOLIC PANEL      Result Value Range   Sodium 138  135 - 145 mEq/L   Potassium 3.8  3.5 - 5.1 mEq/L   Chloride 102  96 - 112 mEq/L   CO2 27  19 - 32 mEq/L   Glucose, Bld 120 (*) 70 - 99 mg/dL   BUN 14  6 - 23 mg/dL   Creatinine, Ser 7.82  0.50 - 1.35 mg/dL   Calcium 9.4  8.4 - 95.6 mg/dL   Total Protein 6.9  6.0 - 8.3 g/dL   Albumin 3.8  3.5 - 5.2 g/dL  AST 26  0 - 37 U/L   ALT 36  0 - 53 U/L   Alkaline Phosphatase 88  39 - 117 U/L   Total Bilirubin 0.4  0.3 - 1.2 mg/dL   GFR calc non Af Amer 85 (*) >90 mL/min   GFR calc Af Amer >90  >90 mL/min  TROPONIN I      Result Value Range   Troponin I <0.30  <0.30 ng/mL  LIPASE, BLOOD      Result Value Range   Lipase 23  11 - 59 U/L   Laboratory interpretation all normal except leukocytosis   Dg Chest 2 View  04/20/2013   *RADIOLOGY REPORT*  Clinical Data:  Chest pain and shortness of breath.  CHEST - 2 VIEW  Comparison: 11/12/2012  Findings: The heart size and mediastinal contours are within normal limits.  Both lungs are clear.  The visualized skeletal structures are unremarkable.  IMPRESSION: No active disease.   Original Report Authenticated By: Irish Lack, M.D.   US Abdomen Limited Ruq  04/20/2013   *RADIOLOGY REPORT*  Clinical Data: Right upper quadrant pain.  LIMITED ABDOMINAL ULTRASOUND  Comparison:  10/08/2010 CT.  Findings:  Gallbladder: No gallstones, gallbladder wall thickening or pericholecystic fluid.  Common bile duct:  3.6 mm.  Liver:  Top normal size.  Increased echogenicity suggestive of fatty infiltration.  No focal mass identified.  IMPRESSION: Fatty liver.  No gallstones detected.   Original Report Authenticated By: Lacy Duverney, M.D.     Date: 04/20/2013  Rate: 81  Rhythm: normal sinus rhythm  QRS Axis: normal  Intervals: QT prolonged  ST/T Wave  abnormalities: nonspecific T wave changes  Conduction Disutrbances:none  Narrative Interpretation:   Old EKG Reviewed: unchanged from 11/12/2012     1. Epigastric abdominal pain     New Prescriptions   OMEPRAZOLE (PRILOSEC) 20 MG CAPSULE    Take 1 or 2 po Q 6hrs for pain   ONDANSETRON (ZOFRAN) 4 MG TABLET    Take 1 tablet (4 mg total) by mouth every 6 (six) hours.    Plan discharge   Devoria Albe, MD, FACEP   MDM    I personally performed the services described in this documentation, which was scribed in my presence. The recorded information has been reviewed and considered.  Devoria Albe, MD, Armando Gang    Ward Givens, MD 04/20/13 [phone]

## 2013-04-20 NOTE — ED Notes (Signed)
Pt says he doesn't have a way home other than to drive himself.  Says he doesn't need any thing for pain or nausea at this time and doesn't want an IV unless absolutely necessary.  Notified Dr. Lynelle Doctor.

## 2013-04-20 NOTE — ED Notes (Signed)
Pt denies any n/v/d.

## 2013-04-20 NOTE — ED Notes (Signed)
Pt states pressure to epigastric are and right upper abdomen x 1 1/2 weeks. States pressure is now going into chest. States that when pressure to right abdomen gets better, so does the discomfort to epigastric and chest.

## 2013-04-21 ENCOUNTER — Encounter (INDEPENDENT_AMBULATORY_CARE_PROVIDER_SITE_OTHER): Payer: Self-pay | Admitting: Internal Medicine

## 2013-04-21 ENCOUNTER — Ambulatory Visit (INDEPENDENT_AMBULATORY_CARE_PROVIDER_SITE_OTHER): Payer: Self-pay | Admitting: Internal Medicine

## 2013-04-21 VITALS — BP 160/70 | HR 84 | Temp 97.0°F | Ht 71.0 in | Wt 264.9 lb

## 2013-04-21 DIAGNOSIS — R1013 Epigastric pain: Secondary | ICD-10-CM

## 2013-04-21 DIAGNOSIS — I10 Essential (primary) hypertension: Secondary | ICD-10-CM | POA: Insufficient documentation

## 2013-04-21 NOTE — Progress Notes (Addendum)
Subjective:     Patient ID: Lance [name], male   DOB: 05-25-1976, 37 y.o.   MRN: [id number]  HPI Presents today with c/o pressure from his epigastric region to his rt upper quadrant. When he lies down the pain will ease off. The pain is sporadic. One day he will have the pain and the next it will be gone. Pain/pressure started about 2 weeks. There was no injury associated with this.  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 He tells me he has had indigestion but this is not the same pain. He says this is not a kidney stone. He has had a kidney stone before. Pain does not occur after eating.  He has tenderness to his epigastric region. Appetite is good. No weight loss. He has a BM about every day. No melena or bright red rectal bleeding.  Current Outpatient Prescriptions  Medication Sig Dispense Refill  . ibuprofen (ADVIL,MOTRIN) 200 MG tablet Take 400 mg by mouth every 6 (six) hours as needed for pain.       No current facility-administered medications for this visit.   Past Medical History  Diagnosis Date  . Hypertension   . Irregular heart beat   CBC    Component Value Date/Time   WBC 11.5* 04/20/2013 1341   RBC 4.95 04/20/2013 1341   HGB 15.6 04/20/2013 1341   HCT 44.1 04/20/2013 1341   PLT 191 04/20/2013 1341   MCV 89.1 04/20/2013 1341   MCH 31.5 04/20/2013 1341   MCHC 35.4 04/20/2013 1341   RDW 13.0 04/20/2013 1341   LYMPHSABS 4.8* 11/12/2012 1955   MONOABS 1.0 11/12/2012 1955   EOSABS 0.4 11/12/2012 1955   BASOSABS 0.0 11/12/2012 1955   CMP     Component Value Date/Time   NA 138 04/20/2013 1341   K 3.8 04/20/2013 1341   CL 102 04/20/2013 1341   CO2 27 04/20/2013 1341   GLUCOSE 120* 04/20/2013 1341   BUN 14 04/20/2013 1341   CREATININE 1.09 04/20/2013 1341   CALCIUM 9.4 04/20/2013 1341   PROT 6.9 04/20/2013 1341   ALBUMIN 3.8 04/20/2013 1341   AST 26 04/20/2013 1341   ALT 36 04/20/2013 1341   ALKPHOS 88 04/20/2013 1341   BILITOT 0.4 04/20/2013 1341   GFRNONAA 85* 04/20/2013 1341   GFRAA >90 04/20/2013 1341  04/20/2013 Lip[place] 21.  04/20/2013 US abdomen:  Findings:  Gallbladder: No gallstones, gallbladder wall thickening or  pericholecystic fluid.  Common bile duct: 3.6 mm.  Liver: Top normal size. Increased echogenicity suggestive of  fatty infiltration. No focal mass identified.  IMPRESSION:  Fatty liver.  No gallstones detected.      Review of Systems see hpi      Objective:   Physical Exam  Filed Vitals:   04/21/13 1519  BP: 160/70  Pulse: 84  Temp: 97 F (36.1 C)  Height: 5\' 11"  (1.803 m)  Weight: 264 lb 14.4 oz (120.158 kg)  Alert and oriented. Skin warm and dry. Oral mucosa is moist.   . Sclera anicteric, conjunctivae is pink. Thyroid not enlarged. No cervical lymphadenopathy. Lungs clear. Heart regular rate and rhythm.  Abdomen is soft. Bowel sounds are positive. No hepatomegaly. No abdominal masses felt. Point tenderness to epigastric region and some tenderness rt upper abdomen lateral. .  No edema to lower extremities.        Assessment:    Epigastric pain ? Etiology. He occasionally has indigestion . Does not sound like PUD  or GB disease but probably needs to be ruledout.  Discussed with Dr. Karilyn Cota.     Plan:     H. Pylori. Double dose PPI. Come back in 2 weeks. HIDA scan.   Dexilant 60mg  30 minutes before breakfast and take the Omeprazole before going to bed.  If HIDA scan normal and H. Pylori normal: may need an EGD.

## 2013-04-21 NOTE — Patient Instructions (Signed)
H. Pylori  And HIDA Scan. OV in 2 weeks.

## 2013-04-22 LAB — H. PYLORI ANTIBODY, IGG: H Pylori IgG: <0.4 {ISR}

## 2013-04-26 ENCOUNTER — Encounter (HOSPITAL_COMMUNITY): Payer: Self-pay

## 2013-04-26 ENCOUNTER — Encounter (HOSPITAL_COMMUNITY)
Admission: RE | Admit: 2013-04-26 | Discharge: 2013-04-26 | Disposition: A | Discharge status: Home / Self Care | Payer: Self-pay | Source: Ambulatory Visit | Attending: Internal Medicine | Admitting: Internal Medicine

## 2013-04-26 DIAGNOSIS — R1013 Epigastric pain: Secondary | ICD-10-CM | POA: Insufficient documentation

## 2013-04-26 MED ORDER — TECHNETIUM TC 99M MEBROFENIN IV KIT
5.0000 | PACK | Freq: Once | INTRAVENOUS | Status: AC | PRN
  Administered 2013-04-26: 5 via INTRAVENOUS

## 2013-04-27 ENCOUNTER — Telehealth (INDEPENDENT_AMBULATORY_CARE_PROVIDER_SITE_OTHER): Payer: Self-pay | Admitting: Internal Medicine

## 2013-04-27 DIAGNOSIS — R948 Abnormal results of function studies of other organs and systems: Secondary | ICD-10-CM

## 2013-04-27 NOTE — Telephone Encounter (Signed)
[name], I did a referral to Dr. Malvin Johns. I'm not sure if u need this.

## 2013-04-27 NOTE — Telephone Encounter (Signed)
xzx

## 2013-04-28 NOTE — Telephone Encounter (Signed)
appt w/ Dr Malvin Johns 05/09/13 @ 300, patient aware

## 2013-05-05 ENCOUNTER — Ambulatory Visit (INDEPENDENT_AMBULATORY_CARE_PROVIDER_SITE_OTHER): Payer: Self-pay | Admitting: Internal Medicine

## 2013-10-06 DIAGNOSIS — I471 Supraventricular tachycardia, unspecified: Secondary | ICD-10-CM

## 2013-10-06 HISTORY — DX: Supraventricular tachycardia, unspecified: I47.10

## 2013-10-06 HISTORY — DX: Supraventricular tachycardia: I47.1

## 2013-10-12 ENCOUNTER — Encounter (HOSPITAL_COMMUNITY): Payer: Self-pay | Admitting: Emergency Medicine

## 2013-10-12 ENCOUNTER — Emergency Department (HOSPITAL_COMMUNITY)
Admission: EM | Admit: 2013-10-12 | Discharge: 2013-10-12 | Disposition: A | Discharge status: Home / Self Care | Payer: Self-pay | Attending: Emergency Medicine | Admitting: Emergency Medicine

## 2013-10-12 DIAGNOSIS — F17200 Nicotine dependence, unspecified, uncomplicated: Secondary | ICD-10-CM | POA: Insufficient documentation

## 2013-10-12 DIAGNOSIS — I471 Supraventricular tachycardia, unspecified: Secondary | ICD-10-CM

## 2013-10-12 DIAGNOSIS — I10 Essential (primary) hypertension: Secondary | ICD-10-CM | POA: Insufficient documentation

## 2013-10-12 DIAGNOSIS — I498 Other specified cardiac arrhythmias: Secondary | ICD-10-CM | POA: Insufficient documentation

## 2013-10-12 DIAGNOSIS — Z880 Allergy status to penicillin: Secondary | ICD-10-CM | POA: Insufficient documentation

## 2013-10-12 LAB — CBC
HCT: 49.4 % (ref 39.0–52.0)
HEMOGLOBIN: 16.9 g/dL (ref 13.0–17.0)
MCH: 30.7 pg (ref 26.0–34.0)
MCHC: 34.2 g/dL (ref 30.0–36.0)
MCV: 89.7 fL (ref 78.0–100.0)
PLATELETS: 191 10*3/uL (ref 150–400)
RBC: 5.51 MIL/uL (ref 4.22–5.81)
RDW: 13 % (ref 11.5–15.5)
WBC: 10.7 10*3/uL — AB (ref 4.0–10.5)

## 2013-10-12 LAB — COMPREHENSIVE METABOLIC PANEL
ALBUMIN: 4.1 g/dL (ref 3.5–5.2)
ALT: 37 U/L (ref 0–53)
AST: 26 U/L (ref 0–37)
Alkaline Phosphatase: 91 U/L (ref 39–117)
BILIRUBIN TOTAL: 0.8 mg/dL (ref 0.3–1.2)
BUN: 16 mg/dL (ref 6–23)
CHLORIDE: 102 meq/L (ref 96–112)
CO2: 26 mEq/L (ref 19–32)
CREATININE: 1.16 mg/dL (ref 0.50–1.35)
Calcium: 10 mg/dL (ref 8.4–10.5)
GFR calc Af Amer: >90 mL/min (ref >90)
GFR calc non Af Amer: 79 mL/min — ABNORMAL LOW (ref >90)
Glucose, Bld: 168 mg/dL — ABNORMAL HIGH (ref 70–99)
Potassium: 4.3 mEq/L (ref 3.7–5.3)
Sodium: 140 mEq/L (ref 137–147)
Total Protein: 7.5 g/dL (ref 6.0–8.3)

## 2013-10-12 LAB — TROPONIN I

## 2013-10-12 MED ORDER — ADENOSINE 6 MG/2ML IV SOLN
12.0000 mg | Freq: Once | INTRAVENOUS | Status: AC
  Administered 2013-10-12: 12 mg via INTRAVENOUS

## 2013-10-12 MED ORDER — ADENOSINE 6 MG/2ML IV SOLN
INTRAVENOUS | Status: AC
Start: 2013-10-12 — End: 2013-10-12
  Administered 2013-10-12: 12 mg via INTRAVENOUS
  Filled 2013-10-12: qty 6

## 2013-10-12 NOTE — Discharge Instructions (Signed)
Avoid caffeine and other stimulants. Followup with cardiology. Return immediately for palpitations, shortness of breath, chest pain or for any concerns  Supraventricular Tachycardia Supraventricular tachycardia (SVT) is an abnormal heart rhythm (arrhythmia) that causes the heart to beat very fast (tachycardia). This kind of fast heartbeat originates in the upper chambers of the heart (atria). SVT can cause the heart to beat greater than 100 beats per minute. SVT can have a rapid burst of heartbeats. This can start and stop suddenly without warning and is called nonsustained. SVT can also be sustained, in which the heart beats at a continuous fast rate.  CAUSES  There can be different causes of SVT. Some of these include:  Heart valve problems such as mitral valve prolapse.  An enlarged heart (hypertrophic cardiomyopathy).  Congenital heart problems.  Heart inflammation (pericarditis).  Hyperthyroidism.  Low potassium or magnesium levels.  Caffeine.  Drug use such as cocaine, methamphetamines, or stimulants.  Some over-the-counter medicines such as:  Decongestants.  Diet medicines.  Herbal medicines. SYMPTOMS  Symptoms of SVT can vary. Symptoms depend on whether the SVT is sustained or nonsustained. You may experience:  No symptoms (asymptomatic).  An awareness of your heart beating rapidly (palpitations).  Shortness of breath.  Chest pain or pressure. If your blood pressure drops because of the SVT, you may experience:  Fainting or near fainting.  Weakness.  Dizziness. DIAGNOSIS  Different tests can be performed to diagnose SVT, such as:  An electrocardiogram (EKG). This is a painless test that records the electrical activity of your heart.  Holter monitor. This is a 24 hour recording of your heart rhythm. You will be given a diary. Write down all symptoms that you have and what you were doing at the time you experienced symptoms.  Arrhythmia monitor. This is a  small device that your wear for several weeks. It records the heart rhythm when you have symptoms.  Echocardiogram. This is an imaging test to help detect abnormal heart structure such as congenital abnormalities, heart valve problems, or heart enlargement.  Stress test. This test can help determine if the SVT is related to exercise.  Electrophysiology study (EPS). This is a procedure that evaluates your heart's electrical system and can help your caregiver find the cause of your SVT. TREATMENT  Treatment of SVT depends on the symptoms, how often it recurs, and whether there are any underlying heart problems.   If symptoms are rare and no other cardiac disease is present, no treatment may be needed.  Blood work may be done to check potassium, magnesium, and thyroid hormone levels to see if they are abnormal. If these levels are abnormal, treatment to correct the problems will occur. Medicines Your caregiver may use oral medicines to treat SVT. These medicines are given for long-term control of SVT. Medicines may be used alone or in combination with other treatments. These medicines work to slow nerve impulses in the heart muscle. These medicines can also be used to treat high blood pressure. Some of these medicines may include:  Calcium channel blockers.  Beta blockers.  Digoxin. Nonsurgical procedures Nonsurgical techniques may be used if oral medicines do not work. Some examples include:  Cardioversion. This technique uses either drugs or an electrical shock to restore a normal heart rhythm.  Cardioversion drugs may be given through an intravenous (IV) line to help "reset" the heart rhythm.  In electrical cardioversion, the caregiver shocks your heart to stop its beat for a split second. This helps to reset the  heart to a normal rhythm.  Ablation. This procedure is done under mild sedation. High frequency radio wave energy is used to destroy the area of heart tissue responsible for  the SVT. HOME CARE INSTRUCTIONS   Do not smoke.  Only take medicines prescribed by your caregiver. Check with your caregiver before using over-the-counter medicines.  Check with your caregiver about how much alcohol and caffeine (coffee, tea, colas, or chocolate) you may have.  It is very important to keep all follow-up referrals and appointments in order to properly manage this problem. SEEK IMMEDIATE MEDICAL CARE IF:  You have dizziness.  You faint or nearly faint.  You have shortness of breath.  You have chest pain or pressure.  You have sudden nausea or vomiting.  You have profuse sweating.  You are concerned about how long your symptoms last.  You are concerned about the frequency of your SVT episodes. If you have the above symptoms, call your local emergency services (911 in [place] immediately. Do not drive yourself to the hospital. MAKE SURE YOU:   Understand these instructions.  Will watch your condition.  Will get help right away if you are not doing well or get worse. Document Released: 09/22/2005 Document Revised: 12/15/2011 Document Reviewed: 01/04/2009 [facility] Patient Information 2014 [place], [place].

## 2013-10-12 NOTE — ED Notes (Signed)
Patient with no complaints at this time. Respirations even and unlabored. Skin warm/dry. Discharge instructions reviewed with patient at this time. Patient given opportunity to voice concerns/ask questions. IV removed per policy and band-aid applied to site. Patient discharged at this time and left Emergency Department with steady gait.  

## 2013-10-12 NOTE — ED Notes (Signed)
Patient converted to NSR after adenosine IV. Denies any complaints. VSS stable after conversion.

## 2013-10-12 NOTE — ED Provider Notes (Signed)
CSN: [id number]     Arrival date & time 10/12/13  4098 History   First MD Initiated Contact with Patient 10/12/13 [phone]     Chief Complaint  Patient presents with  . Tachycardia   (Consider location/radiation/quality/duration/timing/severity/associated sxs/prior Treatment) HPI Patient presents with acute onset palpitations starting at 7 AM this morning after drinking coffee. States he has a history of SVT and this is similar to his past episodes. Patient denies any shortness of breath or chest pain. He has no lightheadedness. Patient denies any other stimulant intake. Was seen in the emergency department last year for similar episode and converted with adenosine. Past Medical History  Diagnosis Date  . Hypertension   . Irregular heart beat    Past Surgical History  Procedure Laterality Date  . None     No family history on file. History  Substance Use Topics  . Smoking status: Current Every Day Smoker    Types: Cigarettes  . Smokeless tobacco: Not on file     Comment: 1 - 1 1/2 pack a day.  . Alcohol Use: No    Review of Systems  Constitutional: Negative for fever and chills.  Respiratory: Negative for shortness of breath.   Cardiovascular: Negative for chest pain, palpitations and leg swelling.  Gastrointestinal: Negative for nausea, vomiting and abdominal pain.  Neurological: Negative for dizziness, syncope, weakness, light-headedness and numbness.  All other systems reviewed and are negative.    Allergies  Bee venom and Penicillins  Home Medications   Current Outpatient Rx  Name  Route  Sig  Dispense  Refill  . ibuprofen (ADVIL,MOTRIN) 200 MG tablet   Oral   Take 400 mg by mouth every 6 (six) hours as needed for pain.          BP 156/94  Pulse 92  Temp(Src) 98.7 F (37.1 C) (Oral)  Resp 14  Ht 5\' 11"  (1.803 m)  Wt 240 lb (108.863 kg)  BMI 33.49 kg/m2  SpO2 91% Physical Exam  Nursing note and vitals reviewed. Constitutional: He is oriented to person,  place, and time. He appears well-developed and well-nourished. No distress.  HENT:  Head: Normocephalic and atraumatic.  Mouth/Throat: Oropharynx is clear and moist.  Eyes: EOM are normal. Pupils are equal, round, and reactive to light.  Neck: Normal range of motion. Neck supple.  Cardiovascular: Regular rhythm.   Tachycardia  Pulmonary/Chest: Effort normal and breath sounds normal. No respiratory distress. He has no wheezes. He has no rales. He exhibits no tenderness.  Abdominal: Soft. Bowel sounds are normal.  Musculoskeletal: Normal range of motion. He exhibits no edema and no tenderness.  No calf swelling or tenderness.  Neurological: He is alert and oriented to person, place, and time.  Patient is alert and oriented x3 with clear, goal oriented speech. Patient has 5/5 motor in all extremities. Sensation is intact to light touch. Patient has a normal gait and walks without assistance.   Skin: Skin is warm and dry. No rash noted. No erythema.  Psychiatric: He has a normal mood and affect. His behavior is normal.    ED Course  CARDIOVERSION Date/Time: 10/12/2013 8:06 AM Performed by: Loren Racer Authorized by: Loren Racer Consent: Verbal consent obtained. The procedure was performed in an emergent situation. Patient sedated: no Cardioversion basis: emergent Pre-procedure rhythm: supraventricular tachycardia Patient position: patient was placed in a supine position Chest area: chest area exposed Number of attempts: 1 Post-procedure rhythm: normal sinus rhythm Complications: no complications Patient tolerance: Patient tolerated the  procedure well with no immediate complications. Comments: Patient given 12 mg of adenosine with conversion to a normal sinus rhythm. No immediate complications.   (including critical care time) Labs Review Labs Reviewed  COMPREHENSIVE METABOLIC PANEL  CBC  TROPONIN I   Imaging Review No results found.  EKG Interpretation   None        MDM   12 mg of adenosine was used as required for his last chemical cardioversion. Patient is a normal sinus rhythm. Signed out to oncoming emergency physician pending labs. Anticipate discharge. Advised to avoid all caffeine and followup with cardiology as outpatient.   Loren Raceravid Qianna Clagett, MD 10/12/13 [phone]

## 2013-10-12 NOTE — ED Notes (Addendum)
Pt states epigastric tenderness only with palpation, noticed when putting lead on pt. Denies pain except with palpation to epigastric area. Pt states his gallbladder is only functioning at 20% but Dr. Malvin JohnsBradford does not want to remove it. Pt also states he has not had his blood pressure medication in past 3 mo.

## 2013-10-12 NOTE — ED Notes (Signed)
Pt states he noticed his heart racing after getting out of the shower this morning ~ 1hour PTA.

## 2014-04-20 ENCOUNTER — Encounter (HOSPITAL_COMMUNITY): Payer: Self-pay | Admitting: Emergency Medicine

## 2014-04-20 ENCOUNTER — Emergency Department (HOSPITAL_COMMUNITY): Payer: Self-pay

## 2014-04-20 ENCOUNTER — Emergency Department (HOSPITAL_COMMUNITY)
Admission: EM | Admit: 2014-04-20 | Discharge: 2014-04-20 | Disposition: A | Discharge status: Home / Self Care | Payer: Self-pay | Attending: Emergency Medicine | Admitting: Emergency Medicine

## 2014-04-20 DIAGNOSIS — Y9289 Other specified places as the place of occurrence of the external cause: Secondary | ICD-10-CM | POA: Insufficient documentation

## 2014-04-20 DIAGNOSIS — Y9389 Activity, other specified: Secondary | ICD-10-CM | POA: Insufficient documentation

## 2014-04-20 DIAGNOSIS — I10 Essential (primary) hypertension: Secondary | ICD-10-CM | POA: Insufficient documentation

## 2014-04-20 DIAGNOSIS — IMO0002 Reserved for concepts with insufficient information to code with codable children: Secondary | ICD-10-CM | POA: Insufficient documentation

## 2014-04-20 DIAGNOSIS — F17200 Nicotine dependence, unspecified, uncomplicated: Secondary | ICD-10-CM | POA: Insufficient documentation

## 2014-04-20 DIAGNOSIS — S8392XA Sprain of unspecified site of left knee, initial encounter: Secondary | ICD-10-CM

## 2014-04-20 DIAGNOSIS — X500XXA Overexertion from strenuous movement or load, initial encounter: Secondary | ICD-10-CM | POA: Insufficient documentation

## 2014-04-20 DIAGNOSIS — Z8679 Personal history of other diseases of the circulatory system: Secondary | ICD-10-CM | POA: Insufficient documentation

## 2014-04-20 DIAGNOSIS — Z880 Allergy status to penicillin: Secondary | ICD-10-CM | POA: Insufficient documentation

## 2014-04-20 MED ORDER — IBUPROFEN 800 MG PO TABS
800.0000 mg | ORAL_TABLET | Freq: Once | ORAL | Status: AC
  Administered 2014-04-20: 800 mg via ORAL
  Filled 2014-04-20: qty 1

## 2014-04-20 MED ORDER — OXYCODONE-ACETAMINOPHEN 5-325 MG PO TABS: 1.0000 | ORAL_TABLET | Freq: Four times a day (QID) | ORAL | Status: DC | PRN

## 2014-04-20 MED ORDER — IBUPROFEN 800 MG PO TABS: 800.0000 mg | ORAL_TABLET | Freq: Three times a day (TID) | ORAL | Status: DC

## 2014-04-20 MED ORDER — OXYCODONE-ACETAMINOPHEN 5-325 MG PO TABS
1.0000 | ORAL_TABLET | Freq: Once | ORAL | Status: DC
  Filled 2014-04-20: qty 1

## 2014-04-20 NOTE — ED Provider Notes (Signed)
CSN: [id number]     Arrival date & time 04/20/14  1023 History  This chart was scribed for Pauline Ausammy Marvelous Woolford PA-C working with Hurman HornJohn M Bednar, MD by Ashley JacobsBrittany Andrews, ED scribe. This patient was seen in room APFT24/APFT24 and the patient's care was started at 10:35 AM.  First MD Initiated Contact with Patient 04/20/14 1034     Chief Complaint  Patient presents with  . Knee Pain     (Consider location/radiation/quality/duration/timing/severity/associated sxs/prior Treatment) Patient is a 38 y.o. male presenting with knee pain. The history is provided by the patient and medical records. No language interpreter was used.  Knee Pain Associated symptoms: no fever    HPI Comments: Lance [name] is a 38 y.o. male who presents to the Emergency Department complaining of medial left knee pain after walking down the side of a hill of his home yesterday. He explains when he squatted and stood up he heard a "pop". Pt mentioned that the pain intensified after lying down  last night. This morning he was unable to walk due to pain. The pain is worse when he bends his knee. Denies ankle pain. Denies pain with flexion or extension of his foot. Denies radiating leg pain. He tried ice application and ibuprofen but that does not seem to help. Denies any prior knee conditions or injury.  Past Medical History  Diagnosis Date  . Hypertension   . Irregular heart beat    Past Surgical History  Procedure Laterality Date  . None     No family history on file. History  Substance Use Topics  . Smoking status: Current Every Day Smoker    Types: Cigarettes  . Smokeless tobacco: Not on file     Comment: 1 - 1 1/2 pack a day.  . Alcohol Use: No    Review of Systems  Constitutional: Negative for fever and chills.  Genitourinary: Negative for dysuria and difficulty urinating.  Musculoskeletal: Positive for arthralgias, gait problem, joint swelling and myalgias.  Skin: Negative for color change and wound.  All other  systems reviewed and are negative.     Allergies  Bee venom and Penicillins  Home Medications   Prior to Admission medications   Medication Sig Start Date End Date Taking? Authorizing Provider  ibuprofen (ADVIL,MOTRIN) 200 MG tablet Take 400 mg by mouth every 6 (six) hours as needed for pain.    Historical Provider, MD   BP 171/89  Pulse 80  Temp(Src) 98 F (36.7 C) (Oral)  Resp 18  Ht 5\' 11"  (1.803 m)  Wt 240 lb (108.863 kg)  BMI 33.49 kg/m2  SpO2 99% Physical Exam  Nursing note and vitals reviewed. Constitutional: He is oriented to person, place, and time. He appears well-developed and well-nourished. No distress.  HENT:  Head: Normocephalic.  Eyes: Pupils are equal, round, and reactive to light.  Neck: Normal range of motion.  Cardiovascular: Normal rate, regular rhythm, normal heart sounds and intact distal pulses.   No murmur heard. Pulmonary/Chest: Effort normal and breath sounds normal. No respiratory distress.  Musculoskeletal: He exhibits tenderness. He exhibits no edema.  Localized tenderness to the medial aspect of the left knee Mild patellar crepitus No obvious diffusion,effusion, or bony deformity. No proximal tenderness.  DP pulse +, distal sensation intact  Neurological: He is alert and oriented to person, place, and time. He exhibits normal muscle tone. Coordination normal.  Skin: Skin is warm and dry. He is not diaphoretic. No erythema.    ED Course  Procedures (  including critical care time) DIAGNOSTIC STUDIES: Oxygen Saturation is 99% on RA, normal by my interpretation.    COORDINATION OF CARE:  10:38 AM Discussed course of care with pt which includes left knee x-ray. Pt understands and agrees.   Labs Review Labs Reviewed - No data to display  Imaging Review Dg Knee Complete 4 Views Left  04/20/2014   CLINICAL DATA:  Traumatic injury and pain  EXAM: LEFT KNEE - COMPLETE 4+ VIEW  COMPARISON:  None.  FINDINGS: There is no evidence of fracture,  dislocation, or joint effusion. There is no evidence of arthropathy or other focal bone abnormality. Soft tissues are unremarkable.  IMPRESSION: No acute abnormality noted.   Electronically Signed   By: Alcide Clever M.D.   On: 04/20/2014 10:58     EKG Interpretation None      MDM   Final diagnoses:  Knee sprain, left, initial encounter    Pt given a knee immob for comfort, he agrees to RICE therapy rx for ibuprofen and percocet.  He agrees to orthopedic f/u in one week if not improving.  He appears stable for d/c.  No concerning sx's for septic joint   I personally performed the services described in this documentation, which was scribed in my presence. The recorded information has been reviewed and is accurate.    Alayza Pieper L. Trisha Mangle, PA-C 04/21/14 1629

## 2014-04-20 NOTE — Discharge Instructions (Signed)
Knee Sprain A knee sprain is a tear in the strong bands of tissue that connect the bones (ligaments) of your knee. HOME CARE  Raise (elevate) your injured knee to lessen puffiness (swelling).  To ease pain and puffiness, put ice on the injured area.  Put ice in a plastic bag.  Place a towel between your skin and the bag.  Leave the ice on for 20 minutes, 2-3 times a day.  Only take medicine as told by your doctor.  Do not leave your knee unprotected until pain and stiffness go away (usually 4-6 weeks).  If you have a cast or splint, do not get it wet. If your doctor told you to not take it off, cover it with a plastic bag when you shower or bathe. Do not swim.  Your doctor may have you do exercises to prevent or limit permanent weakness and stiffness. GET HELP RIGHT AWAY IF:   Your cast or splint becomes damaged.  Your pain gets worse.  You have a lot of pain, puffiness, or numbness below the cast or splint. MAKE SURE YOU:   Understand these instructions.  Will watch your condition.  Will get help right away if you are not doing well or get worse. Document Released: 09/10/2009 Document Revised: 09/27/2013 Document Reviewed: 05/31/2013 [employer] Patient Information 2015 [employer]. This information is not intended to replace advice given to you by your health care provider. Make sure you discuss any questions you have with your health care provider.   

## 2014-04-20 NOTE — ED Notes (Signed)
Pt reports was walking on the side of a hill yesterday and felt left knee pop.  C/O pain since then.

## 2014-04-21 NOTE — ED Provider Notes (Signed)
Medical screening examination/treatment/procedure(s) were performed by non-physician practitioner and as supervising physician I was immediately available for consultation/collaboration.   EKG Interpretation None       Allyse Fregeau M Benzion Mesta, MD 04/21/14 1658 

## 2014-05-25 ENCOUNTER — Encounter (HOSPITAL_COMMUNITY): Payer: Self-pay | Admitting: Emergency Medicine

## 2014-05-25 DIAGNOSIS — Z791 Long term (current) use of non-steroidal anti-inflammatories (NSAID): Secondary | ICD-10-CM | POA: Insufficient documentation

## 2014-05-25 DIAGNOSIS — R109 Unspecified abdominal pain: Secondary | ICD-10-CM | POA: Insufficient documentation

## 2014-05-25 DIAGNOSIS — Z880 Allergy status to penicillin: Secondary | ICD-10-CM | POA: Insufficient documentation

## 2014-05-25 DIAGNOSIS — N1330 Unspecified hydronephrosis: Secondary | ICD-10-CM | POA: Insufficient documentation

## 2014-05-25 DIAGNOSIS — I10 Essential (primary) hypertension: Secondary | ICD-10-CM | POA: Insufficient documentation

## 2014-05-25 DIAGNOSIS — N201 Calculus of ureter: Secondary | ICD-10-CM | POA: Insufficient documentation

## 2014-05-25 DIAGNOSIS — F17200 Nicotine dependence, unspecified, uncomplicated: Secondary | ICD-10-CM | POA: Insufficient documentation

## 2014-05-25 NOTE — ED Notes (Signed)
Pt c/o left flank pain and dysuria.

## 2014-05-26 ENCOUNTER — Emergency Department (HOSPITAL_COMMUNITY)
Admission: EM | Admit: 2014-05-26 | Discharge: 2014-05-26 | Disposition: A | Discharge status: Home / Self Care | Payer: Self-pay | Attending: Emergency Medicine | Admitting: Emergency Medicine

## 2014-05-26 ENCOUNTER — Emergency Department (HOSPITAL_COMMUNITY): Payer: Self-pay

## 2014-05-26 DIAGNOSIS — N1330 Unspecified hydronephrosis: Secondary | ICD-10-CM

## 2014-05-26 DIAGNOSIS — N201 Calculus of ureter: Secondary | ICD-10-CM

## 2014-05-26 DIAGNOSIS — I10 Essential (primary) hypertension: Secondary | ICD-10-CM

## 2014-05-26 LAB — CBC WITH DIFFERENTIAL/PLATELET
Basophils Absolute: 0 10*3/uL (ref 0.0–0.1)
Basophils Relative: 0 % (ref 0–1)
EOS ABS: 0.1 10*3/uL (ref 0.0–0.7)
Eosinophils Relative: 1 % (ref 0–5)
HCT: 45.7 % (ref 39.0–52.0)
HEMOGLOBIN: 15.8 g/dL (ref 13.0–17.0)
LYMPHS ABS: 2.1 10*3/uL (ref 0.7–4.0)
Lymphocytes Relative: 14 % (ref 12–46)
MCH: 30.3 pg (ref 26.0–34.0)
MCHC: 34.6 g/dL (ref 30.0–36.0)
MCV: 87.7 fL (ref 78.0–100.0)
MONOS PCT: 8 % (ref 3–12)
Monocytes Absolute: 1.2 10*3/uL — ABNORMAL HIGH (ref 0.1–1.0)
Neutro Abs: 11 10*3/uL — ABNORMAL HIGH (ref 1.7–7.7)
Neutrophils Relative %: 77 % (ref 43–77)
PLATELETS: 169 10*3/uL (ref 150–400)
RBC: 5.21 MIL/uL (ref 4.22–5.81)
RDW: 12.9 % (ref 11.5–15.5)
WBC: 14.3 10*3/uL — ABNORMAL HIGH (ref 4.0–10.5)

## 2014-05-26 LAB — BASIC METABOLIC PANEL
Anion gap: 13 (ref 5–15)
BUN: 16 mg/dL (ref 6–23)
CO2: 25 mEq/L (ref 19–32)
CREATININE: 1.42 mg/dL — AB (ref 0.50–1.35)
Calcium: 9.2 mg/dL (ref 8.4–10.5)
Chloride: 100 mEq/L (ref 96–112)
GFR, EST AFRICAN AMERICAN: 71 mL/min — AB (ref >90)
GFR, EST NON AFRICAN AMERICAN: 61 mL/min — AB (ref >90)
Glucose, Bld: 236 mg/dL — ABNORMAL HIGH (ref 70–99)
POTASSIUM: 4.3 meq/L (ref 3.7–5.3)
Sodium: 138 mEq/L (ref 137–147)

## 2014-05-26 LAB — URINALYSIS, ROUTINE W REFLEX MICROSCOPIC
Bilirubin Urine: NEGATIVE
Glucose, UA: 250 mg/dL — AB
Ketones, ur: NEGATIVE mg/dL
LEUKOCYTES UA: NEGATIVE
Nitrite: NEGATIVE
PH: 5 (ref 5.0–8.0)
Protein, ur: 100 mg/dL — AB
Urobilinogen, UA: 0.2 mg/dL (ref 0.0–1.0)

## 2014-05-26 LAB — URINE MICROSCOPIC-ADD ON

## 2014-05-26 MED ORDER — HYDROMORPHONE HCL PF 1 MG/ML IJ SOLN
1.0000 mg | Freq: Once | INTRAMUSCULAR | Status: AC
  Administered 2014-05-26: 1 mg via INTRAVENOUS
  Filled 2014-05-26: qty 1

## 2014-05-26 MED ORDER — KETOROLAC TROMETHAMINE 30 MG/ML IJ SOLN
30.0000 mg | Freq: Once | INTRAMUSCULAR | Status: AC
  Administered 2014-05-26: 30 mg via INTRAVENOUS
  Filled 2014-05-26: qty 1

## 2014-05-26 MED ORDER — ONDANSETRON HCL 4 MG/2ML IJ SOLN
4.0000 mg | Freq: Once | INTRAMUSCULAR | Status: AC
  Administered 2014-05-26: 4 mg via INTRAVENOUS
  Filled 2014-05-26: qty 2

## 2014-05-26 MED ORDER — OXYCODONE-ACETAMINOPHEN 5-325 MG PO TABS: 1.0000 | ORAL_TABLET | ORAL | Status: DC | PRN

## 2014-05-26 MED ORDER — CIPROFLOXACIN IN D5W 400 MG/200ML IV SOLN
400.0000 mg | Freq: Once | INTRAVENOUS | Status: AC
  Administered 2014-05-26: 400 mg via INTRAVENOUS
  Filled 2014-05-26: qty 200

## 2014-05-26 MED ORDER — CIPROFLOXACIN HCL 500 MG PO TABS: 500.0000 mg | ORAL_TABLET | Freq: Two times a day (BID) | ORAL | Status: DC

## 2014-05-26 NOTE — ED Provider Notes (Signed)
CSN: [id number]     Arrival date & time 05/25/14  2306 History   First MD Initiated Contact with Patient 05/26/14 0002    This chart was scribed for Lance Gaskinsonald W Jemya Depierro, MD by Marica OtterNusrat Rahman, ED Scribe. This patient was seen in room APA06/APA06 and the patient's care was started at 12:15 AM.  Chief Complaint  Patient presents with  . Flank Pain  PCP: No PCP Per Patient  The history is provided by the patient. No language interpreter was used.   HPI Comments: Lance [name] is a 38 y.o. male, with medical Hx noted below, who presents to the Emergency Department complaining of left sided flank pain with sudden onset at 10PM tonight. Pt reports a Hx of kidney stones and states his current pain is similar to that experienced with kidney stones. Pt describes his current pain as a burning pain and rates it a 10 out of 10. Pt also complains of associated dysuria, abd pain and 2 episodes of emesis.  No cp/sob is reported His flank pain is worsening.  Nothing improves his pain at this time  Past Medical History  Diagnosis Date  . Hypertension   . Irregular heart beat    Past Surgical History  Procedure Laterality Date  . None     No family history on file. History  Substance Use Topics  . Smoking status: Current Every Day Smoker    Types: Cigarettes  . Smokeless tobacco: Not on file     Comment: 1 - 1 1/2 pack a day.  . Alcohol Use: No    Review of Systems  Constitutional: Negative for fever and chills.  Gastrointestinal: Positive for nausea, vomiting and abdominal pain.  Genitourinary: Positive for flank pain (left sided).  All other systems reviewed and are negative.     Allergies  Bee venom and Penicillins  Home Medications   Prior to Admission medications   Medication Sig Start Date End Date Taking? Authorizing Provider  ibuprofen (ADVIL,MOTRIN) 800 MG tablet Take 1 tablet (800 mg total) by mouth 3 (three) times daily. 04/20/14   Tammy L. Triplett, PA-C   oxyCODONE-acetaminophen (PERCOCET/ROXICET) 5-325 MG per tablet Take 1 tablet by mouth every 6 (six) hours as needed. 04/20/14   Tammy L. Triplett, PA-C   Triage Vitals: BP 230/109  Pulse 85  Temp(Src) 97.7 F (36.5 C)  Resp 20  Ht 5\' 11"  (1.803 m)  Wt 250 lb (113.399 kg)  BMI 34.88 kg/m2  SpO2 99% Physical Exam CONSTITUTIONAL: Well developed/well nourished, uncomfortable appearing HEAD: Normocephalic/atraumatic EYES: EOMI/PERRL ENMT: Mucous membranes moist NECK: supple no meningeal signs SPINE:entire spine nontender CV: S1/S2 noted, no murmurs/rubs/gallops noted LUNGS: Lungs are clear to auscultation bilaterally, no apparent distress ABDOMEN: soft, nontender, no rebound or guarding WU:[id number]:Left cva tenderness, no scrotal tenderness, no hernia, chaperone present  NEURO: Pt is awake/alert, moves all extremitiesx4 EXTREMITIES: pulses normal, full ROM SKIN: warm, color normal PSYCH: anxious  ED Course  Procedures  DIAGNOSTIC STUDIES: Oxygen Saturation is 99% on RA, nl by my interpretation.    COORDINATION OF CARE: 12:18 AM-Discussed treatment plan which includes meds, UA with pt at bedside and pt agreed to plan.  2:36 AM Pt with continued pain and noted to have 2mm stone on CT imaging He continues to be hypertensive, though he has h/o HTN And is in acute pain Will continue to treat his pain 3:45 AM D/w dr Isabel Capricegrapey with urology We discussed CT finding and labs findings Will give dose of cipro He  recommends toradol IV If pain uncontrolled, he can be transferred to [facility] or wait to be seen in the clinic in [place] later this morning 5:33 AM Pt reports his pain is completely resolved He is well appearing, no distress Will d/c home with percocet and cipro I spoke at length about need for f/u for his HTN and likely need medication treatment  Labs Review Labs Reviewed  URINALYSIS, ROUTINE W REFLEX MICROSCOPIC - Abnormal; Notable for the following:    Specific Gravity,  Urine >1.030 (*)    Glucose, UA 250 (*)    Hgb urine dipstick SMALL (*)    Protein, ur 100 (*)    All other components within normal limits  URINE MICROSCOPIC-ADD ON - Abnormal; Notable for the following:    Squamous Epithelial / LPF FEW (*)    Bacteria, UA MANY (*)    Crystals CA OXALATE CRYSTALS (*)    All other components within normal limits  CBC WITH DIFFERENTIAL - Abnormal; Notable for the following:    WBC 14.3 (*)    Neutro Abs 11.0 (*)    Monocytes Absolute 1.2 (*)    All other components within normal limits  BASIC METABOLIC PANEL    Imaging Review Ct Abdomen Pelvis Wo Contrast  05/26/2014   CLINICAL DATA:  Left flank pain.  EXAM: CT ABDOMEN AND PELVIS WITHOUT CONTRAST  TECHNIQUE: Multidetector CT imaging of the abdomen and pelvis was performed following the standard protocol without IV contrast.  COMPARISON:  10/08/2010  FINDINGS: The lungs are grossly clear. The heart is normal in size. No pericardial effusion.  The unenhanced appearance of the liver and spleen are unremarkable. The pancreas is normal. The gallbladder is normal. No common bile duct dilatation. The adrenal glands and right kidney are normal. The left kidney demonstrates hydronephrosis and perinephric interstitial changes. There is left hydroureter down to an obstructing 2 mm calculus in the distal ureter. No bladder calculi. No renal or right-sided ureteral calculi.  The stomach, duodenum, small bowel and colon are grossly normal without oral contrast. The appendix is normal. No mesenteric or retroperitoneal mass or adenopathy. The aorta is normal in caliber. Small scattered retroperitoneal nodes.  The bladder, prostate gland and seminal vesicles are unremarkable. No pelvic mass, adenopathy or free pelvic fluid collections no inguinal mass or adenopathy.  The bony structures are intact.  IMPRESSION: 1. 2 mm distal left ureteral calculus causing moderate grade obstruction with hydroureteronephrosis. 2. No definite renal  calculi. 3. No other significant abdominal/pelvic findings.   Electronically Signed   By: Loralie Champagne M.D.   On: 05/26/2014 01:44      MDM   Final diagnoses:  Left ureteral stone  Essential hypertension  Hydroureteronephrosis    Nursing notes including past medical history and social history reviewed and considered in documentation Labs/vital reviewed and considered   I personally performed the services described in this documentation, which was scribed in my presence. The recorded information has been reviewed and is accurate.      Lance Gaskins, MD 05/26/14 [phone]

## 2014-05-29 LAB — URINE CULTURE: Colony Count: 40000

## 2014-05-30 ENCOUNTER — Telehealth (HOSPITAL_BASED_OUTPATIENT_CLINIC_OR_DEPARTMENT_OTHER): Payer: Self-pay | Admitting: Emergency Medicine

## 2014-05-30 NOTE — Telephone Encounter (Signed)
Post ED Visit - Positive Culture Follow-up  Culture report reviewed by antimicrobial stewardship pharmacist:  Wes Dulaney, Pharm.D., BCPS  Celedonio Miyamoto, Pharm.D., BCPS  Georgina Pillion, Pharm.D., BCPS  [place], [address].D., BCPS, AAHIVP  Estella Husk, Pharm.D., BCPS, AAHIVP  Red Christians, Pharm.D.  Tennis Must, Pharm.D.  Positive urine culture 40,000 colonies/ml enterococcus species Treated with Ciprofloxacin hcl po tabs one tablet by mouth bid x 7 days, organism sensitive to the same and no further patient follow-up is required at this time.  Berle Mull 05/30/2014, 11:14 AM

## 2015-02-24 ENCOUNTER — Encounter (HOSPITAL_COMMUNITY): Payer: Self-pay | Admitting: *Deleted

## 2015-02-24 ENCOUNTER — Emergency Department (HOSPITAL_COMMUNITY)
Admission: EM | Admit: 2015-02-24 | Discharge: 2015-02-24 | Disposition: A | Discharge status: Home / Self Care | Payer: Self-pay | Attending: Emergency Medicine | Admitting: Emergency Medicine

## 2015-02-24 DIAGNOSIS — I10 Essential (primary) hypertension: Secondary | ICD-10-CM | POA: Insufficient documentation

## 2015-02-24 DIAGNOSIS — R011 Cardiac murmur, unspecified: Secondary | ICD-10-CM | POA: Insufficient documentation

## 2015-02-24 DIAGNOSIS — Z720 Tobacco use: Secondary | ICD-10-CM | POA: Insufficient documentation

## 2015-02-24 DIAGNOSIS — Z792 Long term (current) use of antibiotics: Secondary | ICD-10-CM | POA: Insufficient documentation

## 2015-02-24 DIAGNOSIS — K047 Periapical abscess without sinus: Secondary | ICD-10-CM | POA: Insufficient documentation

## 2015-02-24 DIAGNOSIS — Z880 Allergy status to penicillin: Secondary | ICD-10-CM | POA: Insufficient documentation

## 2015-02-24 DIAGNOSIS — K029 Dental caries, unspecified: Secondary | ICD-10-CM | POA: Insufficient documentation

## 2015-02-24 MED ORDER — CLINDAMYCIN HCL 150 MG PO CAPS: 300.0000 mg | ORAL_CAPSULE | Freq: Three times a day (TID) | ORAL | Status: DC

## 2015-02-24 NOTE — Discharge Instructions (Signed)
Continue to take the Advil and use the topical gel for pain. Follow up with a dentist as soon as possible.

## 2015-02-24 NOTE — ED Notes (Addendum)
Pt states dental pain x 3 days. Pain to upper and right left molars. States he has a couple of inflamed pockets to the gums. Pt states the pressure is going up his face and will sometimes cause blurriness to right eye, denies at this time. States nausea at times, denies nausea at this time. States he last vomited yesterday morning.

## 2015-02-24 NOTE — ED Provider Notes (Signed)
CSN: [id number]     Arrival date & time 02/24/15  1016 History   First MD Initiated Contact with Patient 02/24/15 1028     Chief Complaint  Patient presents with  . Dental Pain     (Consider location/radiation/quality/duration/timing/severity/associated sxs/prior Treatment) Patient is a 39 y.o. male presenting with tooth pain. The history is provided by the patient.  Dental Pain Location:  Upper and lower Upper teeth location:  2/RU 2nd molar and 3/RU 1st molar Lower teeth location:  32/RL 3rd molar and 31/RL 2nd molar Quality:  Throbbing Severity:  Severe Onset quality:  Gradual Duration:  3 days Timing:  Intermittent Progression:  Worsening Chronicity:  Recurrent Context: abscess and dental caries   Relieved by:  Topical anesthetic gel and NSAIDs Worsened by:  Cold food/drink Associated symptoms: facial pain and gum swelling   Risk factors: smoking    Lance [name] is a 39 y.o. male who presents to the ED with dental pain. The pain started about 3 days ago and is getting worse. Patient thinks he has infection that is draining because there is a bad taste. He has had dental problems in the past.   Past Medical History  Diagnosis Date  . Hypertension   . Irregular heart beat    Past Surgical History  Procedure Laterality Date  . None     No family history on file. History  Substance Use Topics  . Smoking status: Current Every Day Smoker    Types: Cigarettes  . Smokeless tobacco: Not on file     Comment: 1 - 1 1/2 pack a day.  . Alcohol Use: No    Review of Systems Negative except as stated in HPI   Allergies  Bee venom and Penicillins  Home Medications   Prior to Admission medications   Medication Sig Start Date End Date Taking? Authorizing Provider  ciprofloxacin (CIPRO) 500 MG tablet Take 1 tablet (500 mg total) by mouth 2 (two) times daily. One po bid x 7 days Patient not taking: Reported on 02/24/2015 05/26/14   Lance Rhineonald Wickline, MD  clindamycin  (CLEOCIN) 150 MG capsule Take 2 capsules (300 mg total) by mouth 3 (three) times daily. 02/24/15   Lance Charter Orlene OchM Marise Knapper, NP  oxyCODONE-acetaminophen (PERCOCET/ROXICET) 5-325 MG per tablet Take 1 tablet by mouth every 4 (four) hours as needed for severe pain. Patient not taking: Reported on 02/24/2015 05/26/14   Lance Rhineonald Wickline, MD   BP 135/65 mmHg  Pulse 95  Temp(Src) 97.9 F (36.6 C) (Oral)  Resp 16  Ht 5\' 11"  (1.803 m)  Wt 260 lb (117.935 kg)  BMI 36.28 kg/m2  SpO2 98% Physical Exam  Constitutional: He is oriented to person, place, and time. He appears well-developed and well-nourished. No distress.  HENT:  Head: Normocephalic.  Mouth/Throat: Uvula is midline, oropharynx is clear and moist and mucous membranes are normal.    Decay that extends into the gum line, swelling and erythema to the gum surrounding the teeth. Tender on exam.   Eyes: Conjunctivae and EOM are normal.  Neck: Normal range of motion. Neck supple.  Cardiovascular: Normal rate and regular rhythm.   Murmur heard. Pulmonary/Chest: Effort normal and breath sounds normal.  Abdominal: Soft. There is no tenderness.  Musculoskeletal: Normal range of motion.  Lymphadenopathy:    He has no cervical adenopathy.  Neurological: He is alert and oriented to person, place, and time. No cranial nerve deficit.  Skin: Skin is warm and dry.  Psychiatric: He has a normal  mood and affect. His behavior is normal.  Nursing note and vitals reviewed.   ED Course  Procedures (including critical care time) Labs Review Labs Reviewed - No data to display  Imaging Review  MDM  39 y.o. male with dental pain and infection. Stable for d/c without fever or difficulty swallowing. Discussed with the patient clinical findings and plan of care and all questioned fully answered. He will follow up with a dentist as soon as possible. He will return here if any problems arise. Will start antibiotics and he will continue Advil and topical pain management.     Final diagnoses:  Dental abscess  Dental caries      [facility], NP 02/24/15 1103  Bethann Berkshire, MD 02/24/15 [phone]

## 2015-02-27 ENCOUNTER — Encounter (HOSPITAL_COMMUNITY): Payer: Self-pay | Admitting: Cardiology

## 2015-02-27 ENCOUNTER — Emergency Department (HOSPITAL_COMMUNITY)
Admission: EM | Admit: 2015-02-27 | Discharge: 2015-02-27 | Disposition: A | Discharge status: Home / Self Care | Payer: Self-pay | Attending: Emergency Medicine | Admitting: Emergency Medicine

## 2015-02-27 DIAGNOSIS — Z880 Allergy status to penicillin: Secondary | ICD-10-CM | POA: Insufficient documentation

## 2015-02-27 DIAGNOSIS — E860 Dehydration: Secondary | ICD-10-CM | POA: Insufficient documentation

## 2015-02-27 DIAGNOSIS — E1165 Type 2 diabetes mellitus with hyperglycemia: Secondary | ICD-10-CM | POA: Insufficient documentation

## 2015-02-27 DIAGNOSIS — Z720 Tobacco use: Secondary | ICD-10-CM | POA: Insufficient documentation

## 2015-02-27 DIAGNOSIS — I10 Essential (primary) hypertension: Secondary | ICD-10-CM | POA: Insufficient documentation

## 2015-02-27 DIAGNOSIS — R739 Hyperglycemia, unspecified: Secondary | ICD-10-CM

## 2015-02-27 LAB — CBG MONITORING, ED
Glucose-Capillary: >600 mg/dL (ref 65–99)
Glucose-Capillary: 421 mg/dL — ABNORMAL HIGH (ref 65–99)
Glucose-Capillary: 353 mg/dL — ABNORMAL HIGH (ref 65–99)

## 2015-02-27 LAB — CBC WITH DIFFERENTIAL/PLATELET
BASOS PCT: 0 % (ref 0–1)
Basophils Absolute: 0 10*3/uL (ref 0.0–0.1)
Eosinophils Absolute: 0.2 10*3/uL (ref 0.0–0.7)
Eosinophils Relative: 2 % (ref 0–5)
HEMATOCRIT: 41.9 % (ref 39.0–52.0)
Hemoglobin: 14.6 g/dL (ref 13.0–17.0)
Lymphocytes Relative: 25 % (ref 12–46)
Lymphs Abs: 2.7 10*3/uL (ref 0.7–4.0)
MCH: 30.2 pg (ref 26.0–34.0)
MCHC: 34.8 g/dL (ref 30.0–36.0)
MCV: 86.7 fL (ref 78.0–100.0)
Monocytes Absolute: 1.8 10*3/uL — ABNORMAL HIGH (ref 0.1–1.0)
Monocytes Relative: 17 % — ABNORMAL HIGH (ref 3–12)
NEUTROS PCT: 56 % (ref 43–77)
Neutro Abs: 5.9 10*3/uL (ref 1.7–7.7)
Platelets: 240 10*3/uL (ref 150–400)
RBC: 4.83 MIL/uL (ref 4.22–5.81)
RDW: 13.4 % (ref 11.5–15.5)
WBC: 10.6 10*3/uL — ABNORMAL HIGH (ref 4.0–10.5)

## 2015-02-27 LAB — BASIC METABOLIC PANEL
Anion gap: 12 (ref 5–15)
BUN: 22 mg/dL — AB (ref 6–20)
CHLORIDE: 97 mmol/L — AB (ref 101–111)
CO2: 21 mmol/L — ABNORMAL LOW (ref 22–32)
Calcium: 8.9 mg/dL (ref 8.9–10.3)
Creatinine, Ser: 1.43 mg/dL — ABNORMAL HIGH (ref 0.61–1.24)
GFR calc Af Amer: >60 mL/min (ref >60)
GFR calc non Af Amer: >60 mL/min (ref >60)
Glucose, Bld: 521 mg/dL — ABNORMAL HIGH (ref 65–99)
POTASSIUM: 5 mmol/L (ref 3.5–5.1)
Sodium: 130 mmol/L — ABNORMAL LOW (ref 135–145)

## 2015-02-27 MED ORDER — SODIUM CHLORIDE 0.9 % IV BOLUS (SEPSIS)
1000.0000 mL | Freq: Once | INTRAVENOUS | Status: AC
Start: 2015-02-27 — End: 2015-02-27
  Administered 2015-02-27: 1000 mL via INTRAVENOUS

## 2015-02-27 MED ORDER — INSULIN ASPART 100 UNIT/ML ~~LOC~~ SOLN
10.0000 [IU] | Freq: Once | SUBCUTANEOUS | Status: AC
  Administered 2015-02-27: 10 [IU] via SUBCUTANEOUS
  Filled 2015-02-27: qty 1

## 2015-02-27 MED ORDER — SODIUM CHLORIDE 0.9 % IV BOLUS (SEPSIS)
1000.0000 mL | Freq: Once | INTRAVENOUS | Status: AC
  Administered 2015-02-27: 1000 mL via INTRAVENOUS

## 2015-02-27 NOTE — ED Provider Notes (Signed)
CSN: [id number]     Arrival date & time 02/27/15  1731 History   First MD Initiated Contact with Patient 02/27/15 1741     Chief Complaint  Patient presents with  . Hyperglycemia   Patient is a 39 y.o. male presenting with hyperglycemia. The history is provided by the patient.  Hyperglycemia Blood sugar level PTA:  890 Severity:  Moderate Onset quality:  Gradual Timing:  Constant Progression:  Worsening Chronicity:  Recurrent Diabetes status:  Controlled with oral medications Relieved by:  Nothing Ineffective treatments:  None tried Associated symptoms: dizziness, increased thirst, malaise and polyuria   Associated symptoms: no abdominal pain, no chest pain, no fever and no vomiting   pt reports he had labs done at PCP yesterday and was told today his glucose was 890 No cp/sob No Ha He reports fatigue and mild dizziness   Past Medical History  Diagnosis Date  . Hypertension   . Irregular heart beat   . Diabetes mellitus without complication    Past Surgical History  Procedure Laterality Date  . None     History reviewed. No pertinent family history. History  Substance Use Topics  . Smoking status: Current Every Day Smoker    Types: Cigarettes  . Smokeless tobacco: Not on file     Comment: 1 - 1 1/2 pack a day.  . Alcohol Use: No    Review of Systems  Constitutional: Negative for fever.  Cardiovascular: Negative for chest pain.  Gastrointestinal: Negative for vomiting and abdominal pain.  Endocrine: Positive for polydipsia and polyuria.  Neurological: Positive for dizziness. Negative for headaches.  All other systems reviewed and are negative.     Allergies  Bee venom and Penicillins  Home Medications   Prior to Admission medications   Not on File   BP 138/64 mmHg  Pulse 96  Temp(Src) 98.3 F (36.8 C) (Oral)  Resp 18  Ht 5\' 11"  (1.803 m)  Wt 249 lb (112.946 kg)  BMI 34.74 kg/m2  SpO2 97% Physical Exam CONSTITUTIONAL: Well developed/well  nourished, pt does not smell ketotic HEAD: Normocephalic/atraumatic EYES: EOMI ENMT: Mucous membranes moist NECK: supple no meningeal signs SPINE/BACK:entire spine nontender CV: S1/S2 noted, no murmurs/rubs/gallops noted LUNGS: Lungs are clear to auscultation bilaterally, no apparent distress ABDOMEN: soft, nontender, no rebound or guarding, bowel sounds noted throughout abdomen NEURO: Pt is awake/alert/appropriate, moves all extremitiesx4.  No facial droop.   EXTREMITIES: pulses normal/equal, full ROM SKIN: warm, color normal PSYCH: no abnormalities of mood noted, alert and oriented to situation  ED Course  Procedures   Medications  sodium chloride 0.9 % bolus 1,000 mL (0 mLs Intravenous Stopped 02/27/15 2002)  sodium chloride 0.9 % bolus 1,000 mL (0 mLs Intravenous Stopped 02/27/15 2127)  insulin aspart (novoLOG) injection 10 Units (10 Units Subcutaneous Given 02/27/15 2035)    Labs Review Labs Reviewed  CBC WITH DIFFERENTIAL/PLATELET - Abnormal; Notable for the following:    WBC 10.6 (*)    Monocytes Relative 17 (*)    Monocytes Absolute 1.8 (*)    All other components within normal limits  BASIC METABOLIC PANEL - Abnormal; Notable for the following:    Sodium 130 (*)    Chloride 97 (*)    CO2 21 (*)    Glucose, Bld 521 (*)    BUN 22 (*)    Creatinine, Ser 1.43 (*)    All other components within normal limits  CBG MONITORING, ED - Abnormal; Notable for the following:    Glucose-Capillary  421 (*)    All other components within normal limits  CBG MONITORING, ED - Abnormal; Notable for the following:    Glucose-Capillary 353 (*)    All other components within normal limits    No anion gap Glucose improved Pt is well appearing He was just given glipizide by PCP and is already on metformin He has glucometer at home I feel he is safe for d/c home BP 129/75 mmHg  Pulse 78  Temp(Src) 98.3 F (36.8 C) (Oral)  Resp 17  Ht  (1.803 m)  Wt 249 lb (112.946 kg)  BMI  34.74 kg/m2  SpO2 99%   MDM   Final diagnoses:  Hyperglycemia  Dehydration    Nursing notes including past medical history and social history reviewed and considered in documentation Labs/vital reviewed myself and considered during evaluation     Zadie Rhine, MD 02/27/15 2146

## 2015-02-27 NOTE — ED Notes (Addendum)
Had labwork done yesterday and was called to come to er for glucose of 890.  CBG registered high  in triage.

## 2015-02-27 NOTE — Discharge Instructions (Signed)

## 2015-02-27 NOTE — ED Notes (Signed)
Pt alert & oriented x4, stable gait. Patient given discharge instructions, paperwork & prescription(s). Patient  instructed to stop at the registration desk to finish any additional paperwork. Patient verbalized understanding. Pt left department w/ no further questions. 

## 2015-06-24 ENCOUNTER — Emergency Department (HOSPITAL_COMMUNITY): Payer: Self-pay

## 2015-06-24 ENCOUNTER — Emergency Department (HOSPITAL_COMMUNITY)
Admission: EM | Admit: 2015-06-24 | Discharge: 2015-06-24 | Disposition: A | Discharge status: Home / Self Care | Payer: Self-pay | Attending: Physician Assistant | Admitting: Physician Assistant

## 2015-06-24 ENCOUNTER — Encounter (HOSPITAL_COMMUNITY): Payer: Self-pay | Admitting: Emergency Medicine

## 2015-06-24 DIAGNOSIS — Z79899 Other long term (current) drug therapy: Secondary | ICD-10-CM | POA: Insufficient documentation

## 2015-06-24 DIAGNOSIS — M79674 Pain in right toe(s): Secondary | ICD-10-CM | POA: Insufficient documentation

## 2015-06-24 DIAGNOSIS — Z880 Allergy status to penicillin: Secondary | ICD-10-CM | POA: Insufficient documentation

## 2015-06-24 DIAGNOSIS — E119 Type 2 diabetes mellitus without complications: Secondary | ICD-10-CM | POA: Insufficient documentation

## 2015-06-24 DIAGNOSIS — Z720 Tobacco use: Secondary | ICD-10-CM | POA: Insufficient documentation

## 2015-06-24 DIAGNOSIS — I10 Essential (primary) hypertension: Secondary | ICD-10-CM | POA: Insufficient documentation

## 2015-06-24 MED ORDER — INDOMETHACIN 25 MG PO CAPS
25.0000 mg | ORAL_CAPSULE | Freq: Once | ORAL | Status: AC
  Administered 2015-06-24: 25 mg via ORAL
  Filled 2015-06-24: qty 1

## 2015-06-24 MED ORDER — INDOMETHACIN 25 MG PO CAPS: 25.0000 mg | ORAL_CAPSULE | Freq: Three times a day (TID) | ORAL | Status: DC | PRN

## 2015-06-24 MED ORDER — HYDROCODONE-ACETAMINOPHEN 5-325 MG PO TABS: 2.0000 | ORAL_TABLET | ORAL | Status: DC | PRN

## 2015-06-24 NOTE — Discharge Instructions (Signed)
Your exam shows that you have inflammation of the big toe. We are treating you with medication for the inflammation. This could be gout. We are giving you information on Gout. Follow up with your doctor for further testing. Return here as needed.

## 2015-06-24 NOTE — ED Notes (Signed)
Patient complaining of "sharp pain in my right big toe [place] Patient denies injury.

## 2015-06-24 NOTE — ED Provider Notes (Signed)
CSN: [id number]     Arrival date & time 06/24/15  2009 History   First MD Initiated Contact with Patient 06/24/15 2050     Chief Complaint  Patient presents with  . Toe Pain     (Consider location/radiation/quality/duration/timing/severity/associated sxs/prior Treatment) Patient is a 38 y.o. male presenting with toe pain. The history is provided by the patient.  Toe Pain This is a new problem. The current episode started today. The problem occurs constantly. The problem has been gradually worsening.   Lance [name] is a 39 y.o. male who presents to the ED with pain of the right great toe. [place] He was wearing his work boots. He pain has gotten gradually worse. He denies any other problems.  No known injury. No hx of gout.   Past Medical History  Diagnosis Date  . Hypertension   . Irregular heart beat   . Diabetes mellitus without complication    Past Surgical History  Procedure Laterality Date  . None     History reviewed. No pertinent family history. Social History  Substance Use Topics  . Smoking status: Current Every Day Smoker    Types: Cigarettes  . Smokeless tobacco: None     Comment: 1 - 1 1/2 pack a day.  . Alcohol Use: No    Review of Systems Negative except as stated in HPI   Allergies  Bee venom and Penicillins  Home Medications   Prior to Admission medications   Medication Sig Start Date End Date Taking? Authorizing Provider  ALPRAZolam Prudy Feeler) 1 MG tablet Take 1 mg by mouth daily as needed for anxiety.    Historical Provider, MD  cephALEXin (KEFLEX) 500 MG capsule Take 500 mg by mouth 3 (three) times daily. 7 day course filled on 5/213/206    Historical Provider, MD  cetirizine (ZYRTEC) 10 MG tablet Take 10 mg by mouth daily.    Historical Provider, MD  glipiZIDE (GLUCOTROL) 5 MG tablet Take 5 mg by mouth daily.    Historical Provider, MD  HYDROcodone-acetaminophen (NORCO/VICODIN) 5-325 MG per tablet Take 2  tablets by mouth every 4 (four) hours as needed. 06/24/15   Hope Orlene Och, NP  indomethacin (INDOCIN) 25 MG capsule Take 1 capsule (25 mg total) by mouth 3 (three) times daily as needed. 06/24/15   Hope Orlene Och, NP  lisinopril (PRINIVIL,ZESTRIL) 40 MG tablet Take 40 mg by mouth daily.    Historical Provider, MD  metFORMIN (GLUCOPHAGE-XR) 500 MG 24 hr tablet Take 1,000 mg by mouth 2 (two) times daily.    Historical Provider, MD  nystatin (MYCOSTATIN) 100000 UNIT/ML suspension Take 5 mLs by mouth 4 (four) times daily. *Swish and Spit*    Historical Provider, MD  omeprazole (PRILOSEC) 40 MG capsule Take 40 mg by mouth daily.    Historical Provider, MD   BP 138/69 mmHg  Pulse 84  Temp(Src) 98.2 F (36.8 C) (Oral)  Resp 20  Ht  (1.803 m)  Wt 260 lb (117.935 kg)  BMI 36.28 kg/m2  SpO2 100% Physical Exam  Constitutional: He is oriented to person, place, and time. He appears well-developed and well-nourished. No distress.  Eyes: EOM are normal.  Neck: Neck supple.  Cardiovascular: Normal rate.   Pulmonary/Chest: Effort normal.  Musculoskeletal:       Right foot: There is tenderness and swelling. There is normal capillary refill, no deformity and no laceration. Decreased range of motion: due to pain.  Feet:  Slightly increased warmth to the base of the right great toe. No red streaking or signs of infection. Pedal pulse 2+, adequate circulation. Pain with palpation of the base of the right great toe.   Neurological: He is alert and oriented to person, place, and time. No cranial nerve deficit.  Skin: Skin is warm and dry.  Psychiatric: He has a normal mood and affect. His behavior is normal.  Nursing note and vitals reviewed.   ED Course  Procedures (including critical care time) Labs Review Labs Reviewed - No data to display  Imaging Review Dg Toe Great Right  06/24/2015   CLINICAL DATA:  Sharp pain in RIGHT big toe [place] today, denies specific injury;  hypertension, diabetes mellitus  EXAM: RIGHT GREAT TOE  COMPARISON:  None.  FINDINGS: Osseous mineralization normal.  Minimal narrowing of first MTP joint.  Remaining visualized joint spaces preserved.  No acute fracture, dislocation or bone destruction.  IMPRESSION: No acute osseous abnormalities.  Minimal degenerative changes first MTP joint.   Electronically Signed   By: Ulyses Southward M.D.   On: 06/24/2015 21:06    MDM  39 y.o. male with pain to the right great toe that started earlier today. Stable for d/c without focal neuro deficits. Discussed with the patient inflammation and possible gout. He will follow up with his PCP. Will treat for pain and inflammation.   Final diagnoses:  Pain of right great toe       Janne Napoleon, NP 06/24/15 2138  Courteney Randall An, MD 06/24/15 2334

## 2015-07-03 MED FILL — Hydrocodone-Acetaminophen Tab 5-325 MG: ORAL | Qty: 6 | Status: AC

## 2016-08-31 ENCOUNTER — Observation Stay (HOSPITAL_COMMUNITY)
Admission: EM | Admit: 2016-08-31 | Discharge: 2016-09-01 | Disposition: A | Discharge status: Home / Self Care | Payer: 59 | Attending: Family Medicine | Admitting: Family Medicine

## 2016-08-31 ENCOUNTER — Emergency Department (HOSPITAL_COMMUNITY): Payer: 59

## 2016-08-31 ENCOUNTER — Encounter (HOSPITAL_COMMUNITY): Payer: Self-pay | Admitting: Emergency Medicine

## 2016-08-31 DIAGNOSIS — R0789 Other chest pain: Secondary | ICD-10-CM | POA: Diagnosis present

## 2016-08-31 DIAGNOSIS — F17200 Nicotine dependence, unspecified, uncomplicated: Secondary | ICD-10-CM | POA: Diagnosis not present

## 2016-08-31 DIAGNOSIS — E1165 Type 2 diabetes mellitus with hyperglycemia: Secondary | ICD-10-CM | POA: Insufficient documentation

## 2016-08-31 DIAGNOSIS — E782 Mixed hyperlipidemia: Secondary | ICD-10-CM

## 2016-08-31 DIAGNOSIS — I10 Essential (primary) hypertension: Secondary | ICD-10-CM | POA: Diagnosis not present

## 2016-08-31 DIAGNOSIS — Z79899 Other long term (current) drug therapy: Secondary | ICD-10-CM | POA: Insufficient documentation

## 2016-08-31 DIAGNOSIS — IMO0002 Reserved for concepts with insufficient information to code with codable children: Secondary | ICD-10-CM

## 2016-08-31 DIAGNOSIS — Z7984 Long term (current) use of oral hypoglycemic drugs: Secondary | ICD-10-CM | POA: Insufficient documentation

## 2016-08-31 DIAGNOSIS — E119 Type 2 diabetes mellitus without complications: Secondary | ICD-10-CM | POA: Diagnosis not present

## 2016-08-31 DIAGNOSIS — F17210 Nicotine dependence, cigarettes, uncomplicated: Secondary | ICD-10-CM | POA: Diagnosis not present

## 2016-08-31 DIAGNOSIS — Z7982 Long term (current) use of aspirin: Secondary | ICD-10-CM | POA: Diagnosis not present

## 2016-08-31 DIAGNOSIS — E785 Hyperlipidemia, unspecified: Secondary | ICD-10-CM

## 2016-08-31 DIAGNOSIS — R079 Chest pain, unspecified: Secondary | ICD-10-CM | POA: Diagnosis not present

## 2016-08-31 DIAGNOSIS — R0602 Shortness of breath: Secondary | ICD-10-CM | POA: Diagnosis not present

## 2016-08-31 DIAGNOSIS — Z720 Tobacco use: Secondary | ICD-10-CM

## 2016-08-31 HISTORY — DX: Type 2 diabetes mellitus without complications: E11.9

## 2016-08-31 HISTORY — DX: Hyperlipidemia, unspecified: E78.5

## 2016-08-31 HISTORY — DX: Essential (primary) hypertension: I10

## 2016-08-31 LAB — BASIC METABOLIC PANEL
ANION GAP: 13 (ref 5–15)
BUN: 30 mg/dL — AB (ref 6–20)
CO2: 23 mmol/L (ref 22–32)
Calcium: 10.8 mg/dL — ABNORMAL HIGH (ref 8.9–10.3)
Chloride: 94 mmol/L — ABNORMAL LOW (ref 101–111)
Creatinine, Ser: 1.36 mg/dL — ABNORMAL HIGH (ref 0.61–1.24)
GFR calc Af Amer: >60 mL/min (ref >60)
GLUCOSE: 293 mg/dL — AB (ref 65–99)
Potassium: 4.5 mmol/L (ref 3.5–5.1)
SODIUM: 130 mmol/L — AB (ref 135–145)

## 2016-08-31 LAB — CBC
HCT: 46.6 % (ref 39.0–52.0)
Hemoglobin: 16.5 g/dL (ref 13.0–17.0)
MCH: 31.6 pg (ref 26.0–34.0)
MCHC: 35.4 g/dL (ref 30.0–36.0)
MCV: 89.3 fL (ref 78.0–100.0)
Platelets: 277 10*3/uL (ref 150–400)
RBC: 5.22 MIL/uL (ref 4.22–5.81)
RDW: 12.7 % (ref 11.5–15.5)
WBC: 13.1 10*3/uL — ABNORMAL HIGH (ref 4.0–10.5)

## 2016-08-31 LAB — GLUCOSE, CAPILLARY
GLUCOSE-CAPILLARY: 270 mg/dL — AB (ref 65–99)
GLUCOSE-CAPILLARY: 247 mg/dL — AB (ref 65–99)

## 2016-08-31 LAB — TROPONIN I

## 2016-08-31 MED ORDER — ASPIRIN EC 81 MG PO TBEC
81.0000 mg | DELAYED_RELEASE_TABLET | Freq: Every day | ORAL | Status: DC
  Administered 2016-09-01: 81 mg via ORAL
  Filled 2016-08-31: qty 1

## 2016-08-31 MED ORDER — INSULIN ASPART 100 UNIT/ML ~~LOC~~ SOLN
0.0000 [IU] | Freq: Three times a day (TID) | SUBCUTANEOUS | Status: DC
  Administered 2016-08-31: 5 [IU] via SUBCUTANEOUS

## 2016-08-31 MED ORDER — ASPIRIN 81 MG PO CHEW
162.0000 mg | CHEWABLE_TABLET | Freq: Once | ORAL | Status: AC
Start: 2016-08-31 — End: 2016-08-31
  Administered 2016-08-31: 162 mg via ORAL
  Filled 2016-08-31: qty 2

## 2016-08-31 MED ORDER — ALPRAZOLAM 1 MG PO TABS: 1.0000 mg | ORAL_TABLET | Freq: Every day | ORAL | Status: DC | PRN

## 2016-08-31 MED ORDER — GI COCKTAIL ~~LOC~~: 30.0000 mL | Freq: Four times a day (QID) | ORAL | Status: DC | PRN

## 2016-08-31 MED ORDER — ACETAMINOPHEN 325 MG PO TABS: 650.0000 mg | ORAL_TABLET | ORAL | Status: DC | PRN

## 2016-08-31 MED ORDER — NITROGLYCERIN 2 % TD OINT
1.0000 [in_us] | TOPICAL_OINTMENT | Freq: Once | TRANSDERMAL | Status: AC
  Administered 2016-08-31: 1 [in_us] via TOPICAL
  Filled 2016-08-31: qty 1

## 2016-08-31 MED ORDER — LISINOPRIL 10 MG PO TABS
40.0000 mg | ORAL_TABLET | Freq: Every day | ORAL | Status: DC
  Administered 2016-09-01: 40 mg via ORAL
  Filled 2016-08-31: qty 4

## 2016-08-31 MED ORDER — ONDANSETRON HCL 4 MG/2ML IJ SOLN: 4.0000 mg | Freq: Four times a day (QID) | INTRAMUSCULAR | Status: DC | PRN

## 2016-08-31 MED ORDER — SODIUM CHLORIDE 0.9 % IV BOLUS (SEPSIS)
1000.0000 mL | Freq: Once | INTRAVENOUS | Status: AC
  Administered 2016-08-31: 1000 mL via INTRAVENOUS

## 2016-08-31 NOTE — ED Triage Notes (Signed)
PT c/o central chest pressure with some SOB on exertion starting yesterday evening.

## 2016-08-31 NOTE — ED Notes (Signed)
Pt stable and ready for transport to [facility].  Report given to Ledon SnareLeslie Davis, RN.

## 2016-08-31 NOTE — H&P (Signed)
History and Physical  Lance [name] OZH:[id number] DOB: 11/23/1975 DOA: 08/31/2016  [facility]  Patient coming from: home  Chief Complaint: chest pressure, short of breath  HPI:  40yom PMH DM, HTN, hyperlipidemia presented to ED with chest pressure and shortness of breath with exertion, relieved with rest. Initial evaluation in ED reassuring, pain free, plan obs with cardiology consult in AM.  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 11/25 was building a dog house when developed chest pressure and shortness of breath which was relieved with rest. Today was making breakfast and again developed SOB and chest pressure, again improved with rest. No previous history of heart disease, but both father and mother have heart disease. No diaphoresis, radiation to left arm, neck or jaw, nausea or vomiting.  ED Course: afebrile, VSS, no hypoxia. Treated with ASA, nitroglycerin, NS Pertinent labs: sodium 130, chloride 94, BUN 30, creatinine 1.35, calcium 10.8, glucose 293, AG 13. Troponin negative EKG: Independently reviewed. SR, no acute changes. Imaging: CXR independently reviewed. No acute disease. I concur with radiologist's interpretation.  Review of Systems:  Negative for fever, sore throat, rash, new muscle aches, dysuria, bleeding, n/v/abdominal pain.  Possible for dizziness and blurred vision.  Past Medical History:  Diagnosis Date  . Diabetes mellitus without complication (HCC)   . Hyperlipemia   . Hypertension   . Irregular heart beat     Past Surgical History:  Procedure Laterality Date  . CHOLECYSTECTOMY       reports that he has been smoking Cigarettes.  He has never used smokeless tobacco. He reports that he does not drink alcohol or use drugs. Ambulatory    Allergies  Allergen Reactions  . Bee Venom Swelling  . Penicillins Itching and Rash    Family History  Problem Relation Age of Onset  . Heart  disease Mother [id number]    CABG  . Heart attack Father [phone]     Prior to Admission medications   Medication Sig Start Date End Date Taking? Authorizing Provider  ALPRAZolam Prudy Feeler(XANAX) 1 MG tablet Take 1 mg by mouth daily as needed for anxiety.   Yes Historical Provider, MD  aspirin EC 81 MG tablet Take 81 mg by mouth daily.   Yes Historical Provider, MD  canagliflozin (INVOKANA) 100 MG TABS tablet Take 100 mg by mouth at bedtime.   Yes Historical Provider, MD  lisinopril (PRINIVIL,ZESTRIL) 40 MG tablet Take 40 mg by mouth daily.   Yes Historical Provider, MD  metFORMIN (GLUCOPHAGE-XR) 500 MG 24 hr tablet Take 1,000 mg by mouth 2 (two) times daily.   Yes Historical Provider, MD    Physical Exam: Vitals:   08/31/16 1215 08/31/16 1230 08/31/16 1245 08/31/16 1300  BP:  (!) 74/62  (!) 116/48  Pulse: 79 72 80 82  Resp: 14 15 21 16   Temp:      TempSrc:      SpO2: 96% 97% 95% 97%  Weight:      Height:        Constitutional:  . Appears calm and comfortable Eyes:  . PERRL and irises appear normal . Normal conjunctivae and lids ENMT:  . nose appears normal . grossly normal hearing Neck:  . neck appears normal, no masses . no thyromegaly Respiratory:  . CTA bilaterally, no w/r/r.  . Respiratory effort normal. Speaks in full sentences Cardiovascular:  . RRR, no m/r/g . No LE extremity edema    Abdomen:  . Abdomen  appears normal; no tenderness or masses Musculoskeletal:  . RUE, LUE, RLE, LLE   o strength and tone normal o No tenderness, masses Skin:  . No rashes, lesions, ulcers . palpation of skin: no induration or nodules Neurologic:  . CN 2-12 intact . Sensation all 4 extremities intact Psychiatric:  . judgement and insight appear normal . Mental status o Mood, affect appropriate  Wt Readings from Last 3 Encounters:  08/31/16 116.6 kg (257 lb)  06/24/15 117.9 kg (260 lb)  02/27/15 112.9 kg (249 lb)    I have personally reviewed following labs and imaging studies  Labs  on Admission:  CBC:  Recent Labs Lab 08/31/16 1204  WBC 13.1*  HGB 16.5  HCT 46.6  MCV 89.3  PLT 277   Basic Metabolic Panel:  Recent Labs Lab 08/31/16 1204  NA 130*  K 4.5  CL 94*  CO2 23  GLUCOSE 293*  BUN 30*  CREATININE 1.36*  CALCIUM 10.8*   Cardiac Enzymes:  Recent Labs Lab 08/31/16 1204  TROPONINI <0.03     Radiological Exams on Admission: Dg Chest 2 View  Result Date: 08/31/2016 CLINICAL DATA:  Chest pain EXAM: CHEST  2 VIEW COMPARISON:  04/20/2013 FINDINGS: The heart size and mediastinal contours are within normal limits. Both lungs are clear. The visualized skeletal structures are unremarkable. IMPRESSION: No active cardiopulmonary disease. Electronically Signed   By: Alcide CleverMark  Lukens M.D.   On: 08/31/2016 12:25    EKG: Independently reviewed. As above in HPI  Principal Problem:   Chest pain Active Problems:   Essential hypertension, benign   DM type 2 (diabetes mellitus, type 2) (HCC)   Hyperlipidemia   Tobacco use disorder   Assessment/Plan 1. Chest pain. Currently pain free. Suspicious for true angina. Initial troponin negative, EKG non-acute. RF as below, also positive family history. 2. DM type 2 on metformin, Invokana. 3. HTN, stable 4. Hyperlipidemia  5. Tobacco use disorder, cigarettes. Recommended cessation.   Obs to telemetry  Serial troponin  Echo  Consult cardiology in AM for further recommendations.  DVT prophylaxis:SCDs Code Status: full code Family Communication: none    It is my clinical opinion that referral for OBSERVATION is reasonable and necessary in this patient  . presenting with symptoms of chest pressure, shortness of breath, concerning for angina . in the context of PMH including DM type 2, HTN, hyperlipidemia, smoker.  The aforementioned taken together are felt to place the patient at high risk for further clinical deterioration. However it is anticipated that the patient may be medically stable for  discharge from the hospital within 24 to 48 hours.   Time spent: 45 minutes  Lance Sacksaniel Annalie Wenner, MD  [facility] Direct contact: [phone] --Via amion app OR  --[email or web address]; password [email or web address]  7PM-7AM contact night coverage as above  08/31/2016, 2:16 PM

## 2016-08-31 NOTE — ED Provider Notes (Signed)
[facility] Provider Note   CSN: [id number] Arrival date & time: 08/31/16  1136     History   Chief Complaint Chief Complaint  Patient presents with  . Chest Pain    HPI Lance [name] is a 40 y.o. male.  Pt presents to the ED today with chest pressure and sob.  It started last night.  He has never had anything like it in the past.  The pt said it worsens with exertion.  He has never had a cardiac eval.      Past Medical History:  Diagnosis Date  . Diabetes mellitus without complication (HCC)   . Hyperlipemia   . Hypertension   . Irregular heart beat     Patient Active Problem List   Diagnosis Date Noted  . DM type 2 (diabetes mellitus, type 2) (HCC) 08/31/2016  . Abdominal pain, epigastric 04/21/2013  . Essential hypertension, benign 04/21/2013    Past Surgical History:  Procedure Laterality Date  . CHOLECYSTECTOMY    . none         Home Medications    Prior to Admission medications   Medication Sig Start Date End Date Taking? Authorizing Provider  ALPRAZolam Prudy Feeler(XANAX) 1 MG tablet Take 1 mg by mouth daily as needed for anxiety.   Yes Historical Provider, MD  aspirin EC 81 MG tablet Take 81 mg by mouth daily.   Yes Historical Provider, MD  canagliflozin (INVOKANA) 100 MG TABS tablet Take 100 mg by mouth at bedtime.   Yes Historical Provider, MD  lisinopril (PRINIVIL,ZESTRIL) 40 MG tablet Take 40 mg by mouth daily.   Yes Historical Provider, MD  metFORMIN (GLUCOPHAGE-XR) 500 MG 24 hr tablet Take 1,000 mg by mouth 2 (two) times daily.   Yes Historical Provider, MD    Family History History reviewed. No pertinent family history.  Social History Social History  Substance Use Topics  . Smoking status: Current Every Day Smoker    Types: Cigarettes  . Smokeless tobacco: Never Used     Comment: 1 - 1 1/2 pack a day.  . Alcohol use No     Allergies   Bee venom and Penicillins   Review of Systems Review of Systems  Respiratory: Positive for  shortness of breath.   Cardiovascular: Positive for chest pain.     Physical Exam Updated Vital Signs BP (!) 116/48   Pulse 82   Temp 97.7 F (36.5 C) (Oral)   Resp 16   Ht 5\' 11"  (1.803 m)   Wt 257 lb (116.6 kg)   SpO2 97%   BMI 35.84 kg/m   Physical Exam  Constitutional: He is oriented to person, place, and time. He appears well-developed and well-nourished.  HENT:  Head: Normocephalic and atraumatic.  Right Ear: External ear normal.  Left Ear: External ear normal.  Nose: Nose normal.  Mouth/Throat: Oropharynx is clear and moist.  Eyes: Conjunctivae and EOM are normal. Pupils are equal, round, and reactive to light.  Neck: Normal range of motion. Neck supple.  Cardiovascular: Normal rate, regular rhythm, normal heart sounds and intact distal pulses.   Pulmonary/Chest: Effort normal and breath sounds normal.  Abdominal: Soft. Bowel sounds are normal.  Musculoskeletal: Normal range of motion.  Neurological: He is alert and oriented to person, place, and time.  Skin: Skin is warm.  Psychiatric: He has a normal mood and affect. His behavior is normal. Judgment and thought content normal.  Nursing note and vitals reviewed.    ED Treatments /  Results  Labs (all labs ordered are listed, but only abnormal results are displayed) Labs Reviewed  BASIC METABOLIC PANEL - Abnormal; Notable for the following:       Result Value   Sodium 130 (*)    Chloride 94 (*)    Glucose, Bld 293 (*)    BUN 30 (*)    Creatinine, Ser 1.36 (*)    Calcium 10.8 (*)    All other components within normal limits  CBC - Abnormal; Notable for the following:    WBC 13.1 (*)    All other components within normal limits  TROPONIN I    EKG  EKG Interpretation  Date/Time:  Sunday August 31 2016 11:44:19 EST Ventricular Rate:  82 PR Interval:    QRS Duration: 90 QT Interval:  363 QTC Calculation: 424 R Axis:   70 Text Interpretation:  Sinus rhythm Confirmed by Particia NearingHAVILAND MD, Gianlucas Evenson ([id number])  on 08/31/2016 12:10:03 PM       Radiology Dg Chest 2 View  Result Date: 08/31/2016 CLINICAL DATA:  Chest pain EXAM: CHEST  2 VIEW COMPARISON:  04/20/2013 FINDINGS: The heart size and mediastinal contours are within normal limits. Both lungs are clear. The visualized skeletal structures are unremarkable. IMPRESSION: No active cardiopulmonary disease. Electronically Signed   By: Alcide CleverMark  Lukens M.D.   On: 08/31/2016 12:25    Procedures Procedures (including critical care time)  Medications Ordered in ED Medications  aspirin chewable tablet 162 mg (162 mg Oral Given 08/31/16 1224)  nitroGLYCERIN (NITROGLYN) 2 % ointment 1 inch (1 inch Topical Given 08/31/16 1224)  sodium chloride 0.9 % bolus 1,000 mL (1,000 mLs Intravenous New Bag/Given 08/31/16 1303)     Initial Impression / Assessment and Plan / ED Course  I have reviewed the triage vital signs and the nursing notes.  Pertinent labs & imaging results that were available during my care of the patient were reviewed by me and considered in my medical decision making (see chart for details).  Clinical Course    Pt with a heart score of 4.  Story is good.  He has HTN, high cholesterol, diabetes, he smokes, his mom just had a CABG.   Pt said that his doctor has been working on getting his medication right for his diabetes.  I also spoke with the pt about stopping smoking.  He agrees to try.  Pt d/w Dr. Irene LimboGoodrich ([facility]) who will admit pt for obs.  Final Clinical Impressions(s) / ED Diagnoses   Final diagnoses:  Chest pain, unspecified type  Poorly controlled type 2 diabetes mellitus (HCC)  Tobacco abuse    New Prescriptions New Prescriptions   No medications on file     Jacalyn LefevreJulie Marny Smethers, MD 08/31/16 1314

## 2016-09-01 ENCOUNTER — Observation Stay (HOSPITAL_BASED_OUTPATIENT_CLINIC_OR_DEPARTMENT_OTHER): Payer: 59

## 2016-09-01 ENCOUNTER — Encounter (HOSPITAL_COMMUNITY): Payer: Self-pay | Admitting: Cardiology

## 2016-09-01 DIAGNOSIS — E782 Mixed hyperlipidemia: Secondary | ICD-10-CM

## 2016-09-01 DIAGNOSIS — E119 Type 2 diabetes mellitus without complications: Secondary | ICD-10-CM | POA: Diagnosis not present

## 2016-09-01 DIAGNOSIS — E781 Pure hyperglyceridemia: Secondary | ICD-10-CM

## 2016-09-01 DIAGNOSIS — R079 Chest pain, unspecified: Secondary | ICD-10-CM

## 2016-09-01 DIAGNOSIS — I208 Other forms of angina pectoris: Secondary | ICD-10-CM | POA: Diagnosis not present

## 2016-09-01 DIAGNOSIS — E1165 Type 2 diabetes mellitus with hyperglycemia: Secondary | ICD-10-CM

## 2016-09-01 DIAGNOSIS — E118 Type 2 diabetes mellitus with unspecified complications: Secondary | ICD-10-CM | POA: Diagnosis not present

## 2016-09-01 DIAGNOSIS — F17200 Nicotine dependence, unspecified, uncomplicated: Secondary | ICD-10-CM | POA: Diagnosis not present

## 2016-09-01 DIAGNOSIS — E785 Hyperlipidemia, unspecified: Secondary | ICD-10-CM | POA: Diagnosis not present

## 2016-09-01 LAB — ECHOCARDIOGRAM COMPLETE
Height: 71 in
Weight: 4032 oz

## 2016-09-01 LAB — COMPREHENSIVE METABOLIC PANEL
ALBUMIN: 4 g/dL (ref 3.5–5.0)
ALK PHOS: 85 U/L (ref 38–126)
ALT: 30 U/L (ref 17–63)
AST: 20 U/L (ref 15–41)
Anion gap: 13 (ref 5–15)
BILIRUBIN TOTAL: 0.6 mg/dL (ref 0.3–1.2)
BUN: 29 mg/dL — AB (ref 6–20)
CALCIUM: 9.1 mg/dL (ref 8.9–10.3)
CO2: 19 mmol/L — ABNORMAL LOW (ref 22–32)
Chloride: 97 mmol/L — ABNORMAL LOW (ref 101–111)
Creatinine, Ser: 1.14 mg/dL (ref 0.61–1.24)
GFR calc Af Amer: >60 mL/min (ref >60)
GLUCOSE: 289 mg/dL — AB (ref 65–99)
Potassium: 4.2 mmol/L (ref 3.5–5.1)
Sodium: 129 mmol/L — ABNORMAL LOW (ref 135–145)
TOTAL PROTEIN: 6.7 g/dL (ref 6.5–8.1)

## 2016-09-01 LAB — NM MYOCAR MULTI W/SPECT W/WALL MOTION / EF
CHL CUP NUCLEAR SSS: 1
CSEPED: 5 min
CSEPEW: 7 METS
Exercise duration (sec): 25 s
LHR: 0.3
LV dias vol: 64 mL (ref 62–150)
LVSYSVOL: 21 mL
MPHR: 180 {beats}/min
NUC STRESS TID: 1.09
Peak HR: 150 {beats}/min
Percent HR: 83 %
RPE: 15
Rest HR: 81 {beats}/min
SDS: 1
SRS: 0

## 2016-09-01 LAB — TROPONIN I: Troponin I: <0.03 ng/mL (ref <0.03)

## 2016-09-01 LAB — LIPID PANEL
CHOL/HDL RATIO: 11.1 ratio
CHOLESTEROL: 267 mg/dL — AB (ref 0–200)
HDL: 24 mg/dL — ABNORMAL LOW (ref >40)
LDL Cholesterol: UNDETERMINED mg/dL (ref 0–99)
TRIGLYCERIDES: 1142 mg/dL — AB (ref <150)
VLDL: UNDETERMINED mg/dL (ref 0–40)

## 2016-09-01 LAB — GLUCOSE, CAPILLARY
Glucose-Capillary: 220 mg/dL — ABNORMAL HIGH (ref 65–99)
Glucose-Capillary: 243 mg/dL — ABNORMAL HIGH (ref 65–99)

## 2016-09-01 MED ORDER — SODIUM CHLORIDE 0.9% FLUSH
INTRAVENOUS | Status: AC
  Administered 2016-09-01: 10 mL via INTRAVENOUS
  Filled 2016-09-01: qty 10

## 2016-09-01 MED ORDER — ATORVASTATIN CALCIUM 80 MG PO TABS: 80.0000 mg | ORAL_TABLET | Freq: Every day | ORAL | 0 refills | Status: DC

## 2016-09-01 MED ORDER — TECHNETIUM TC 99M TETROFOSMIN IV KIT
30.0000 | PACK | Freq: Once | INTRAVENOUS | Status: AC | PRN
  Administered 2016-09-01: 31.5 via INTRAVENOUS

## 2016-09-01 MED ORDER — REGADENOSON 0.4 MG/5ML IV SOLN
INTRAVENOUS | Status: AC
Start: 2016-09-01 — End: 2016-09-01
  Administered 2016-09-01: 0.4 mg via INTRAVENOUS
  Filled 2016-09-01: qty 5

## 2016-09-01 MED ORDER — INSULIN ASPART 100 UNIT/ML ~~LOC~~ SOLN: 0.0000 [IU] | Freq: Every day | SUBCUTANEOUS | Status: DC

## 2016-09-01 MED ORDER — TECHNETIUM TC 99M TETROFOSMIN IV KIT
10.0000 | PACK | Freq: Once | INTRAVENOUS | Status: AC | PRN
  Administered 2016-09-01: 10.5 via INTRAVENOUS

## 2016-09-01 MED ORDER — INSULIN ASPART 100 UNIT/ML ~~LOC~~ SOLN: 0.0000 [IU] | Freq: Three times a day (TID) | SUBCUTANEOUS | Status: DC

## 2016-09-01 MED ORDER — ATORVASTATIN CALCIUM 20 MG PO TABS: 20.0000 mg | ORAL_TABLET | Freq: Every day | ORAL | Status: DC

## 2016-09-01 NOTE — Progress Notes (Signed)
*  PRELIMINARY RESULTS* Echocardiogram 2D Echocardiogram has been performed.  Jeryl Columbialliott, Dan Scearce 09/01/2016, 2:54 PM

## 2016-09-01 NOTE — Progress Notes (Signed)
Inpatient Diabetes Program Recommendations  AACE/ADA: New Consensus Statement on Inpatient Glycemic Control (2015)  Target Ranges:  Prepandial:   less than 140 mg/dL      Peak postprandial:   less than 180 mg/dL (1-2 hours)      Critically ill patients:  140 - 180 mg/dL   Results for Lance [name], [name] (MRN [id number]) as of 09/01/2016 08:39  Ref. Range 08/31/2016 16:41 08/31/2016 21:10  Glucose-Capillary Latest Ref Range: 65 - 99 mg/dL 696270 (H) 295247 (H)   Review of Glycemic Control  Diabetes history: DM2 Outpatient Diabetes medications: Invokana 100 mg QHS, Metformin XR 1000 BID Current orders for Inpatient glycemic control: Novolog 0-9 units TID with meals   Inpatient Diabetes Program Recommendations: Insulin - Basal: Please consider ordering Lantus 10 units daily starting now. Correction (SSI): Please consider increasing Novolog correction to moderate scale and adding bedtime correction. HgbA1C: Please add an A1C to blood in lab to evaluate glycemic control over the past 2-3 months.  Thanks, Lance PennerMarie Erik Nessel, RN, MSN, CDE Diabetes Coordinator Inpatient Diabetes Program [phone] (Team Pager from 8am to 5pm)

## 2016-09-01 NOTE — Consult Note (Signed)
CARDIOLOGY CONSULT NOTE   Patient ID: Lance [name] MRN: [id number] DOB/AGE: 40/06/1976 40 y.o.  Admit Date: 08/31/2016 Referring Physician: TR[facility]-Goodrich Primary Physician: [facility] Consulting Cardiologist: Jonelle SidleSamuel G. Rhys Lichty, MD Reason for Consultation: Chest Pain  Clinical Summary Lance [name] is a 40 y.o.male with history significant for hypertension, diabetes, hyperlipidemia, ongoing tobacco abuse, obesity; and family history of coronary artery disease in both parents, (father with CAD and heart attack at age 40, mother with heart disease in CABG at age 40). He presented to the emergency room with chest discomfort, poorly controlled diabetes, and dyspnea.  He was in his usual state of health when he was trying to build a dog house. He began to have some worsening shortness of breath and chest pressure. This occurred with exertion relieved with rest. This continued on and off for 2 days with associated fatigue. By Sunday afternoon he became concerned that the symptoms were not subsiding and presented to the emergency room.  On arrival to the emergency room the patient\'s blood pressure was 114/60 heart rate 79 O2 sat 96% he was afebrile. Sodium 1:30, potassium 4.5, chloride 94, BUN 30, creatinine 1.36, glucose 293. EKG revealed normal sinus rhythm with a heart rate of 82 bpm with no acute ST-T wave abnormalities. Troponin was negative 3. Chest x-ray was negative for acute cardiopulmonary disease. He was treated with aspirin, nitroglycerin topically, and IV fluids. He was admitted for observation and cardiac evaluation.  The patient states she has been working with his primary care provider for medication adjustments to control his diabetes, and had his medications recently changed, and began taking new doses over the weekend.   Allergies  Allergen Reactions  . Bee Venom Swelling  . Penicillins Itching and Rash    Medications Scheduled Medications: . aspirin EC  81 mg  Oral Daily  . atorvastatin  20 mg Oral q1800  . insulin aspart  0-15 Units Subcutaneous TID WC  . insulin aspart  0-5 Units Subcutaneous QHS  . lisinopril  40 mg Oral Daily     PRN Medications: acetaminophen, ALPRAZolam, gi cocktail, ondansetron (ZOFRAN) IV   Past Medical History:  Diagnosis Date  . Essential hypertension   . Hyperlipemia   . Irregular heart beat   . Type 2 diabetes mellitus (HCC)     Past Surgical History:  Procedure Laterality Date  . CHOLECYSTECTOMY      Family History  Problem Relation Age of Onset  . Heart disease Mother 62    CABG  . Heart attack Father 57    Social History Lance [name] reports that he has been smoking Cigarettes.  He has never used smokeless tobacco. Lance [name] reports that he does not drink alcohol.  Review of Systems Complete review of systems are found to be negative unless outlined in H&P above.  Physical Examination Blood pressure 108/63, pulse 77, temperature 97.9 F (36.6 C), temperature source Oral, resp. rate 18, height 5\' 11" (1.803 m), weight 252 lb (114.3 kg), SpO2 97 %.  Intake/Output Summary (Last 24 hours) at 09/01/16 1335 Last data filed at 08/31/16 1723  Gross per 24 hour  Intake             1240 ml  Output                0 ml  Net             12 40 ml    Telemetry: Normal sinus rhythm.  GEN: Obese male,  no acute distress HEENT: Conjunctiva and lids normal, oropharynx clear. Neck: Supple, no elevated JVP or carotid bruits, no thyromegaly. Lungs: Clear to auscultation, nonlabored breathing at rest. Cardiac: Regular rate and rhythm, no S3 or significant systolic murmur, no pericardial rub. Abdomen: Soft, nontender, bowel sounds present, no guarding or rebound. Extremities: No pitting edema, distal pulses 2+. Skin: Warm and dry. Musculoskeletal: No kyphosis. Neuropsychiatric: Alert and oriented x3, affect grossly appropriate.  Lab Results  Basic Metabolic Panel:  Recent Labs Lab 08/31/16 1204  09/01/16 0633  NA 130* 129*  K 4.5 4.2  CL 94* 97*  CO2 23 19*  GLUCOSE 293* 289*  BUN 30* 29*  CREATININE 1.36* 1.14  CALCIUM 10.8* 9.1    CBC:  Recent Labs Lab 08/31/16 1204  WBC 13.1*  HGB 16.5  HCT 46.6  MCV 89.3  PLT 277    Cardiac Enzymes:  Recent Labs Lab 08/31/16 1204 08/31/16 1745 09/01/16 0007 09/01/16 0608  TROPONINI <0.03 <0.03 <0.03 <0.03   Radiology: Dg Chest 2 View  Result Date: 08/31/2016 CLINICAL DATA:  Chest pain EXAM: CHEST  2 VIEW COMPARISON:  04/20/2013 FINDINGS: The heart size and mediastinal contours are within normal limits. Both lungs are clear. The visualized skeletal structures are unremarkable. IMPRESSION: No active cardiopulmonary disease. Electronically Signed   By: Alcide Clever M.D.   On: 08/31/2016 12:25   Nm Myocar Multi W/spect W/wall Motion / Ef  Result Date: 09/01/2016  No diagnostic ST segment changes to indicate ischemia. No significant arrhythmias.  Blood pressure demonstrated a hypertensive response to exercise. Lexiscan also utilized given poor exercise tolerance.  No significant myocardial perfusion defect extending indicate scar or ischemia.  This is a low risk study.  Nuclear stress EF: 67%.     ECG: Tracing from 11/20 03/25/2016 showed normal sinus rhythm.   Impression and Recommendations  1. Chest pressure: Typical features for angina, associated with exertion, relieved with rest. Multiple cardiovascular risk factors to include hypertension, hyperlipidemia, ongoing tobacco abuse, obesity, and strong family history. EKG and cardiac troponin negative 3 arguing against ACS. Will discontinue nitroglycerin.   Plan nuclear medicine stress test for diagnostic prognostic purposes. He has been nothing by mouth. Echocardiogram is pending.   2. Hypertension: On lisinopril 40 mg daily. Blood pressure soft on nitroglycerin patch. Will discontinue.  3. Hyperlipidemia: Check fasting lipids and LFTs as he is NPO. Not on  statin therapy at home.   4. Uncontrolled Diabetes: PCP adjust the medications. He is currently on subcutaneous insulin sliding scale.  5. Ongoing tobacco abuse: He has been smoking a pack a day for greater than 25 years. Smoking cessation has been discussed with patient. He verbalizes understanding.  6. Obesity: Weight loss and increase exercise is recommended.   Signed: Bettey Mare. Lawrence NP AACC  09/01/2016, 1:35 PM Co-Sign MD  Attending note:  Patient seen and examined. Reviewed records and updated her chart. Case discussed with Ms. Lawrence NP. Mr. Villamor presents with exertional chest discomfort typical for angina as well as dyspnea on exertion. He states that he has had similar symptoms before when doing heavy work such as digging, experienced this by building doghouse over the weekend. Symptoms were more prolonged this time causing him to seek medical attention. Under observation he has had no further chest pain. Troponin I levels have been normal and his ECG is normal. Chest x-ray shows no acute findings.  On examination he appears comfortable. Systolic blood pressure 100-110 range, heart rate in the 70s in sinus  rhythm. He is obese, lungs are clear, cardiac exam with RRR no gallop or rub. No significant cardiac murmur. Lab work shows potassium 4.2, creatinine 1.1, normal LFTs, normal troponin I levels, total cholesterol 267 with triglycerides 1142, HDL 24, hemoglobin 16.5, glucose 289.  He underwent exercise/Lexiscan Myoview today which was overall low risk without obvious ischemic defects and normal LVEF. I discussed the results with the patient and his wife today.  Patient reports typical angina symptoms although with low risk Myoview results. He could have microvascular angina rather than obstructive CAD. No evidence of ACS by enzymes. At this point would recommend aggressive risk factor modification and medical therapy, we will arrange a follow-up visit in the office to reevaluate.  If he has progressive symptoms a cardiac catheterization would still be considered. He has significantly abnormal lipids, should be on statin therapy and also fibrate/omega-3 supplements. Continue to work with PCP on better glucose control and weight loss.  Jonelle SidleSamuel G. Deadrick Stidd, M.D., F.A.C.C.

## 2016-09-01 NOTE — Progress Notes (Signed)
Discharge instructions given on medications and follow up visits,patient and family verbalize understanding.Prescriptions sent to Pharmacy of choice documented on AVS. Accompanied by staff to an awaiting vehicle.

## 2016-09-01 NOTE — Progress Notes (Signed)
PROGRESS NOTE  Lance HaffJustin C [name] RUE:[id number] DOB: 02/22/1976 DOA: 08/31/2016 [facility]  Brief Narrative: 40yom PMH DM, HTN, hyperlipidemia presented to ED with chest pressure and shortness of breath with exertion, relieved with rest.   Assessment/Plan: 1. Chest pain, suspicious for angina. Remains pain free. Troponins negative. Echo and cardiology consultation pending. Risk factors as below, also positive family history. 2. DM type 2 on metformin, Invokana as outpatient. Stable.  3. HTN, remains stable. 4. Hyperlipidemia, not on treatment 5. Tobacco use disorder, cigarettes. Recommended cessation.    Asymptomatic, ACS ruled out.   Await cardiology input and echocardiogram.  Continue present management.    Add statin.   DVT prophylaxis: SCDs Code Status: FULL  Family Communication: Family bedside Disposition Plan: Discharge home once improved.   Brendia Sacksaniel Bevelyn Arriola, MD  [facility] Direct contact: [phone] --Via amion app OR  --[email or web address]; password [email or web address]  7PM-7AM contact night coverage as above 09/01/2016, 7:06 AM  LOS: 0 days   Consultants:  Cardiology   Procedures:  Echocardiogram pending   Antimicrobials:  None   CC:  Follow-up chest pain  Interval history/Subjective: No overnight problems. Denies any chest pain/pressure, neck/jaw, or arm pain. No shortness of breath.  ROS: No nausea or vomiting.   Objective: Vitals:   08/31/16 1501 08/31/16 1531 08/31/16 2059 09/01/16 0500  BP: 120/68 (!) 109/53 119/64 108/63  Pulse: 71 95 85 77  Resp: 17 18 18 18   Temp:  97.5 F (36.4 C) 97.7 F (36.5 C) 97.9 F (36.6 C)  TempSrc:  Oral Oral Oral  SpO2: 97% 97% 98% 97%  Weight:  114.3 kg (252 lb)    Height:  5\' 11"  (1.803 m)      Intake/Output Summary (Last 24 hours) at 09/01/16 0706 Last data filed at 08/31/16 1723  Gross per 24 hour  Intake             1240 ml  Output                0 ml  Net             1240 ml      Filed Weights   08/31/16 1147 08/31/16 1531  Weight: 116.6 kg (257 lb) 114.3 kg (252 lb)    Exam:    Constitutional:  . Appears calm and comfortable Respiratory:  . CTA bilaterally, no w/r/r.  . Respiratory effort normal.   Cardiovascular:  . RRR, no m/r/g . No LE extremity edema    I have personally reviewed following labs and imaging studies:  Glucose 220   troponins negative  Scheduled Meds: . aspirin EC  81 mg Oral Daily  . insulin aspart  0-9 Units Subcutaneous TID WC  . lisinopril  40 mg Oral Daily   Continuous Infusions:  Principal Problem:   Chest pain Active Problems:   Essential hypertension, benign   DM type 2 (diabetes mellitus, type 2) (HCC)   Hyperlipidemia   Tobacco use disorder   LOS: 0 days   Time spent 25 minutes       By signing my name below, I, Cynda AcresHailei Fulton, attest that this documentation has been prepared under the direction and in the presence of Brendia Sacksaniel Sylvestre Rathgeber, MD. Electronically signed: Cynda AcresHailei Fulton, Scribe. 09/01/16 9:10 AM   I personally performed the services described in this documentation. All medical record entries made by the scribe were at my direction. I have reviewed the chart and agree that the record reflects my personal performance and  is accurate and complete. Aarib Pulido, MD    

## 2016-09-01 NOTE — Discharge Summary (Signed)
Physician Discharge Summary  Joellyn HaffJustin C Goh JXB:[id number] DOB: 02/02/1976 DOA: 08/31/2016  [facility]  Admit date: 08/31/2016 Discharge date: 09/01/2016  Recommendations for Outpatient Follow-up:  1. Follow-up chest pain, see below. Patient counseled to return for chest pain or SOB. 2. Encourage smoking cessation 3. Aggressive risk factor reduction, started on statin, consider fish oil. Continue gemfibrozil.   Follow-up Information    Joni ReiningKathryn Lawrence, NP Follow up on 09/16/2016.   Specialties:  Nurse Practitioner, Radiology, Cardiology Why:  2:30 pm Contact information: [address] [place] [address] [phone]           Discharge Diagnoses:  1. Chest pain 2. DM type 2 3. HTN 4. Hyperlipidemia 5. Hypertriglyceridemia 6. Tobacco use disorder, cigarettes  Discharge Condition: improved Disposition: home  Diet recommendation: heart healthy, diabetic diet  Filed Weights   08/31/16 1147 08/31/16 1531  Weight: 116.6 kg (257 lb) 114.3 kg (252 lb)    History of present illness:  40yom PMH DM, HTN, hyperlipidemia presented to ED with chest pressure and shortness of breath with exertion, relieved with rest.   Hospital Course:  Observed overnight, no recurrent chest pain or shortness of breath. Seen by cardiology, had low risk nuclear study, cleared for discharge home with followup. I strongly recommended stopping smoking, daily exercise when cleared by cardiology and continued weight loss and treatment of medical conditions. Hospitalization uncomplicated.  1. Chest pain, suspicious for angina but nuclear study low risk. Remains pain free. Troponins negative. Echo pending. Risk factors as below, also positive family history. Discussed with Dr. Diona BrownerMcDowell, no further testing for now. Plan home, outpatient f/u, add statin. 2. DM type 2 on metformin, Invokana as outpatient. Stable.  3. HTN, remains stable. 4. Hyperlipidemia, not on treatment 5. Tobacco  use disorder, cigarettes. Recommended cessation. 6. Obesity. Actively working on losing weight, has already lost 30 pounds   Discharge Instructions  Discharge Instructions    Diet - low sodium heart healthy    Complete by:  As directed    Diet Carb Modified    Complete by:  As directed    Discharge instructions    Complete by:  As directed    Call your physician or seek immediate medical attention for chest pain, trouble breathing, vomiting, sweating, chest pressure, jaw or neck or left arm pain.       Medication List    TAKE these medications   ALPRAZolam 1 MG tablet Commonly known as:  XANAX Take 1 mg by mouth daily as needed for anxiety.   aspirin EC 81 MG tablet Take 81 mg by mouth daily.   atorvastatin 80 MG tablet Commonly known as:  LIPITOR Take 1 tablet (80 mg total) by mouth daily.   INVOKANA 100 MG Tabs tablet Generic drug:  canagliflozin Take 100 mg by mouth at bedtime.   lisinopril 40 MG tablet Commonly known as:  PRINIVIL,ZESTRIL Take 40 mg by mouth daily.   metFORMIN 500 MG 24 hr tablet Commonly known as:  GLUCOPHAGE-XR Take 1,000 mg by mouth 2 (two) times daily.      Allergies  Allergen Reactions  . Bee Venom Swelling  . Penicillins Itching and Rash    The results of significant diagnostics from this hospitalization (including imaging, microbiology, ancillary and laboratory) are listed below for reference.    Significant Diagnostic Studies: Dg Chest 2 View  Result Date: 08/31/2016 CLINICAL DATA:  Chest pain EXAM: CHEST  2 VIEW COMPARISON:  04/20/2013 FINDINGS: The heart size and mediastinal  contours are within normal limits. Both lungs are clear. The visualized skeletal structures are unremarkable. IMPRESSION: No active cardiopulmonary disease. Electronically Signed   By: Alcide CleverMark  Lukens M.D.   On: 08/31/2016 12:25   Nm Myocar Multi W/spect W/wall Motion / Ef  Result Date: 09/01/2016  No diagnostic ST segment changes to indicate ischemia. No  significant arrhythmias.  Blood pressure demonstrated a hypertensive response to exercise. Lexiscan also utilized given poor exercise tolerance.  No significant myocardial perfusion defect extending indicate scar or ischemia.  This is a low risk study.  Nuclear stress EF: 67%.      Labs: Basic Metabolic Panel:  Recent Labs Lab 08/31/16 1204 09/01/16 0633  NA 130* 129*  K 4.5 4.2  CL 94* 97*  CO2 23 19*  GLUCOSE 293* 289*  BUN 30* 29*  CREATININE 1.36* 1.14  CALCIUM 10.8* 9.1   Liver Function Tests:  Recent Labs Lab 09/01/16 0633  AST 20  ALT 30  ALKPHOS 85  BILITOT 0.6  PROT 6.7  ALBUMIN 4.0   CBC:  Recent Labs Lab 08/31/16 1204  WBC 13.1*  HGB 16.5  HCT 46.6  MCV 89.3  PLT 277   Cardiac Enzymes:  Recent Labs Lab 08/31/16 1204 08/31/16 1745 09/01/16 0007 09/01/16 0608  TROPONINI <0.03 <0.03 <0.03 <0.03   CBG:  Recent Labs Lab 08/31/16 1641 08/31/16 2110 09/01/16 0844 09/01/16 1247  GLUCAP 270* 247* 220* 243*    Principal Problem:   Chest pain Active Problems:   Essential hypertension, benign   DM type 2 (diabetes mellitus, type 2) (HCC)   Hyperlipidemia   Tobacco use disorder   Time coordinating discharge: 35 minutes  Signed:  Brendia Sacksaniel Goodrich, MD [facility] 09/01/2016, 3:36 PM

## 2016-09-02 LAB — HEMOGLOBIN A1C
Hgb A1c MFr Bld: 11.1 % — ABNORMAL HIGH (ref 4.8–5.6)
MEAN PLASMA GLUCOSE: 272 mg/dL

## 2016-09-16 ENCOUNTER — Ambulatory Visit (INDEPENDENT_AMBULATORY_CARE_PROVIDER_SITE_OTHER): Payer: 59 | Admitting: Adult Health

## 2016-09-16 ENCOUNTER — Encounter: Payer: Self-pay | Admitting: Adult Health

## 2016-09-16 VITALS — BP 150/86 | HR 95 | Ht 71.0 in | Wt 252.0 lb

## 2016-09-16 DIAGNOSIS — I10 Essential (primary) hypertension: Secondary | ICD-10-CM

## 2016-09-16 DIAGNOSIS — Z720 Tobacco use: Secondary | ICD-10-CM | POA: Diagnosis not present

## 2016-09-16 DIAGNOSIS — R0789 Other chest pain: Secondary | ICD-10-CM | POA: Diagnosis not present

## 2016-09-16 NOTE — Patient Instructions (Signed)

## 2016-09-16 NOTE — Progress Notes (Signed)
Name: Lance [name]    DOB: 07/31/1976  Age: 40 y.o.  MR#: [id number]       [facility]:  [facility]      Insurance: Payor: [employer] / Plan: [employer] OTHER / Product Type: *No Product type* /   CC:   No chief complaint on file.   VS There were no vitals filed for this visit.  Weights Current Weight  08/31/16 252 lb (114.3 kg)  06/24/15 260 lb (117.9 kg)  02/27/15 249 lb (112.9 kg)    Blood Pressure  BP Readings from Last 3 Encounters:  09/01/16 108/63  06/24/15 138/69  02/27/15 129/75     Admit date:  (Not on file) Last encounter with RMR:  Visit date not found   Allergy Bee venom and Penicillins  Current Outpatient Prescriptions  Medication Sig Dispense Refill  . ALPRAZolam (XANAX) 1 MG tablet Take 1 mg by mouth daily as needed for anxiety.    Marland Kitchen. aspirin EC 81 MG tablet Take 81 mg by mouth daily.    Marland Kitchen. atorvastatin (LIPITOR) 80 MG tablet Take 1 tablet (80 mg total) by mouth daily. 30 tablet 0  . canagliflozin (INVOKANA) 300 MG TABS tablet Take 300 mg by mouth daily before breakfast.    . lisinopril (PRINIVIL,ZESTRIL) 40 MG tablet Take 40 mg by mouth daily.    . metFORMIN (GLUCOPHAGE-XR) 500 MG 24 hr tablet Take 1,000 mg by mouth 2 (two) times daily.     No current facility-administered medications for this visit.     Discontinued Meds:    Medications Discontinued During This Encounter  Medication Reason  . canagliflozin (INVOKANA) 100 MG TABS tablet Error    Patient Active Problem List   Diagnosis Date Noted  . DM type 2 (diabetes mellitus, type 2) (HCC) 08/31/2016  . Hyperlipidemia 08/31/2016  . Tobacco use disorder 08/31/2016  . Chest pain 08/31/2016  . Abdominal pain, epigastric 04/21/2013  . Essential hypertension, benign 04/21/2013    LABS    Component Value Date/Time   NA 129 (L) 09/01/2016 0633   NA 130 (L) 08/31/2016 1204   NA 130 (L) 02/27/2015 1930   K 4.2 09/01/2016 0633   K 4.5 08/31/2016 1204   K 5.0 02/27/2015 1930   CL 97 (L) 09/01/2016 0633   CL 94 (L) 08/31/2016 1204   CL 97 (L) 02/27/2015 1930   CO2 19 (L) 09/01/2016 0633   CO2 23 08/31/2016 1204   CO2 21 (L) 02/27/2015 1930   GLUCOSE 289 (H) 09/01/2016 0633   GLUCOSE 293 (H) 08/31/2016 1204   GLUCOSE 521 (H) 02/27/2015 1930   BUN 29 (H) 09/01/2016 0633   BUN 30 (H) 08/31/2016 1204   BUN 22 (H) 02/27/2015 1930   CREATININE 1.14 09/01/2016 0633   CREATININE 1.36 (H) 08/31/2016 1204   CREATININE 1.43 (H) 02/27/2015 1930   CALCIUM 9.1 09/01/2016 0633   CALCIUM 10.8 (H) 08/31/2016 1204   CALCIUM 8.9 02/27/2015 1930   GFRNONAA >60 09/01/2016 0633   GFRNONAA >60 08/31/2016 1204   GFRNONAA >60 02/27/2015 1930   GFRAA >60 09/01/2016 0633   GFRAA >60 08/31/2016 1204   GFRAA >60 02/27/2015 1930   CMP     Component Value Date/Time   NA 129 (L) 09/01/2016 0633   K 4.2 09/01/2016 0633   CL 97 (L) 09/01/2016 0633   CO2 19 (L) 09/01/2016 0633   GLUCOSE 289 (H) 09/01/2016 0633   BUN 29 (H) 09/01/2016 0633   CREATININE 1.14  09/01/2016 0633   CALCIUM 9.1 09/01/2016 0633   PROT 6.7 09/01/2016 0633   ALBUMIN 4.0 09/01/2016 0633   AST 20 09/01/2016 0633   ALT 30 09/01/2016 0633   ALKPHOS 85 09/01/2016 0633   BILITOT 0.6 09/01/2016 0633   GFRNONAA >60 09/01/2016 0633   GFRAA >60 09/01/2016 0633       Component Value Date/Time   WBC 13.1 (H) 08/31/2016 1204   WBC 10.6 (H) 02/27/2015 1844   WBC 14.3 (H) 05/26/2014 0213   HGB 16.5 08/31/2016 1204   HGB 14.6 02/27/2015 1844   HGB 15.8 05/26/2014 0213   HCT 46.6 08/31/2016 1204   HCT 41.9 02/27/2015 1844   HCT 45.7 05/26/2014 0213   MCV 89.3 08/31/2016 1204   MCV 86.7 02/27/2015 1844   MCV 87.7 05/26/2014 0213    Lipid Panel     Component Value Date/Time   CHOL 267 (H) 09/01/2016 0608   TRIG 1,142 (H) 09/01/2016 0608   HDL 24 (L) 09/01/2016 0608   CHOLHDL 11.1 09/01/2016 0608   VLDL UNABLE TO CALCULATE IF TRIGLYCERIDE OVER 400 mg/dL 86/57/846911/27/2017 62950608   LDLCALC UNABLE TO CALCULATE IF  TRIGLYCERIDE OVER 400 mg/dL 28/41/324411/27/2017 [id number]    ABG No results found for: PHART, PCO2ART, PO2ART, HCO3, TCO2, ACIDBASEDEF, O2SAT   Lab Results  Component Value Date   TSH 2.230 11/30/2011   BNP (last 3 results) No results for input(s): BNP in the last 8760 hours.  ProBNP (last 3 results) No results for input(s): PROBNP in the last 8760 hours.  Cardiac Panel (last 3 results) No results for input(s): CKTOTAL, CKMB, TROPONINI, RELINDX in the last 72 hours.  Iron/TIBC/Ferritin/ %Sat No results found for: IRON, TIBC, FERRITIN, IRONPCTSAT   EKG Orders placed or performed during the hospital encounter of 08/31/16  . EKG 12-Lead  . EKG 12-Lead  . EKG     Prior Assessment and Plan Problem List as of 09/16/2016 Reviewed: 04/21/2013  3:40 PM by Llana AlimentSETZER,TERRI W, NP     Cardiovascular and Mediastinum   Essential hypertension, benign     Endocrine   DM type 2 (diabetes mellitus, type 2) (HCC)     Other   Abdominal pain, epigastric   Hyperlipidemia   Tobacco use disorder   Chest pain       Imaging: Dg Chest 2 View  Result Date: 08/31/2016 CLINICAL DATA:  Chest pain EXAM: CHEST  2 VIEW COMPARISON:  04/20/2013 FINDINGS: The heart size and mediastinal contours are within normal limits. Both lungs are clear. The visualized skeletal structures are unremarkable. IMPRESSION: No active cardiopulmonary disease. Electronically Signed   By: Alcide CleverMark  Lukens M.D.   On: 08/31/2016 12:25   Nm Myocar Multi W/spect W/wall Motion / Ef  Result Date: 09/01/2016  No diagnostic ST segment changes to indicate ischemia. No significant arrhythmias.  Blood pressure demonstrated a hypertensive response to exercise. Lexiscan also utilized given poor exercise tolerance.  No significant myocardial perfusion defect extending indicate scar or ischemia.  This is a low risk study.  Nuclear stress EF: 67%.

## 2016-09-16 NOTE — Progress Notes (Signed)
Cardiology Office Note   Date:  09/16/2016   ID:  Lance HaffJustin C Zweig, DOB 01/13/1976, MRN [id number]  PCP:  [facility]  Cardiologist: [place]/  Joni ReiningKathryn Brittne Kawasaki, NP   No chief complaint on file.     History of Present Illness: Lance [name] is a 40 y.o. male who presents for ongoing assessment and management of hypertension, diabetes, hyperlipidemia, with history of ongoing tobacco abuse, obesity, family history of coronary artery disease. We saw this patient on consultation on 09/01/2016 in the setting of chest pain. She is found to be hyperglycemic with hyponatremia. He was ruled out for ACS.  The patient had a stress Myoview, which was found to be negative for ischemia. He was advised to follow with primary care physician for ongoing management of diabetes as this was not well controlled, he was started on anti-cholesterol medications and recommended strongly to lose weight and quit smoking.  09/01/2016  No diagnostic ST segment changes to indicate ischemia. No significant arrhythmias.  Blood pressure demonstrated a hypertensive response to exercise. Lexiscan also utilized given poor exercise tolerance.  No significant myocardial perfusion defect extending indicate scar or ischemia.  This is a low risk study.  Nuclear stress EF: 67%.   He is here today without any complaints. He is cut back on his smoking from 2-1/2 packs a day to half a pack a day. He is lost 8 pounds. He is following his primary care physician regularly for diabetes control. He is tolerating his medications. He did forget to take his antihypertensive today. Otherwise he is doing well and is asymptomatic.  Past Medical History:  Diagnosis Date  . Essential hypertension   . Hyperlipemia   . Irregular heart beat   . Type 2 diabetes mellitus (HCC)     Past Surgical History:  Procedure Laterality Date  . CHOLECYSTECTOMY       Current Outpatient Prescriptions  Medication Sig Dispense Refill  .  ALPRAZolam (XANAX) 1 MG tablet Take 1 mg by mouth daily as needed for anxiety.    Marland Kitchen. aspirin EC 81 MG tablet Take 81 mg by mouth daily.    Marland Kitchen. atorvastatin (LIPITOR) 80 MG tablet Take 1 tablet (80 mg total) by mouth daily. 30 tablet 0  . canagliflozin (INVOKANA) 300 MG TABS tablet Take 300 mg by mouth daily before breakfast.    . lisinopril (PRINIVIL,ZESTRIL) 40 MG tablet Take 40 mg by mouth daily.    . metFORMIN (GLUCOPHAGE-XR) 500 MG 24 hr tablet Take 1,000 mg by mouth 2 (two) times daily.     No current facility-administered medications for this visit.     Allergies:   Bee venom and Penicillins    Social History:  The patient  reports that he has been smoking Cigarettes.  He has never used smokeless tobacco. He reports that he does not drink alcohol or use drugs.   Family History:  The patient's family history includes Heart attack (age of onset: 6957) in his father; Heart disease (age of onset: 8362) in his mother.    ROS: All other systems are reviewed and negative. Unless otherwise mentioned in H&P    PHYSICAL EXAM: VS:  BP (!) 150/86   Pulse 95   Ht 5\' 11"  (1.803 m)   Wt 252 lb (114.3 kg)   SpO2 96%   BMI 35.15 kg/m  , BMI Body mass index is 35.15 kg/m. GEN: Well nourished, well developed, in no acute distress  HEENT: normal  Neck: no JVD, carotid bruits,  or masses Cardiac: RRR; no murmurs, rubs, or gallops,no edema  Respiratory:  clear to auscultation bilaterally, normal work of breathing GI: soft, nontender, nondistended, + BS MS: no deformity or atrophy  Skin: warm and dry, no rash Neuro:  Strength and sensation are intact Psych: euthymic mood, full affect   Recent Labs: 08/31/2016: Hemoglobin 16.5; Platelets 277 09/01/2016: ALT 30; BUN 29; Creatinine, Ser 1.14; Potassium 4.2; Sodium 129    Lipid Panel    Component Value Date/Time   CHOL 267 (H) 09/01/2016 0608   TRIG 1,142 (H) 09/01/2016 0608   HDL 24 (L) 09/01/2016 0608   CHOLHDL 11.1 09/01/2016 0608   VLDL  UNABLE TO CALCULATE IF TRIGLYCERIDE OVER 400 mg/dL 16/10/960411/27/2017 [id number]   LDLCALC UNABLE TO CALCULATE IF TRIGLYCERIDE OVER 400 mg/dL 81/19/147811/27/2017 [id number]      Wt Readings from Last 3 Encounters:  09/16/16 252 lb (114.3 kg)  08/31/16 252 lb (114.3 kg)  06/24/15 260 lb (117.9 kg)      Other studies Reviewed: Additional studies/ records that were reviewed today include:  Echocardiogram 09/01/2016 Left ventricle: The cavity size was normal. Wall thickness was   increased in a pattern of mild LVH. Systolic function was normal.   The estimated ejection fraction was in the range of 60% to 65%.   Wall motion was normal; there were no regional wall motion   abnormalities. Doppler parameters are consistent with abnormal   left ventricular relaxation (grade 1 diastolic dysfunction). - Aortic valve: Trileaflet; mildly calcified leaflets. There was no   significant regurgitation. - Mitral valve: There was trivial regurgitation. - Right atrium: Central venous pressure (est): 3 mm Hg. - Tricuspid valve: There was physiologic regurgitation. - Pericardium, extracardiac: A prominent pericardial fat pad was   present.   ASSESSMENT AND PLAN:  1.  Chest pain: Multiple cardiovascular risk factors. Normal stress test during hospitalization. He has been asymptomatic since discharge. I explained to him if he has recurrence of chest discomfort or worsening symptoms of fatigue or dyspnea, he is to call us. I have explained that the next step would be a cardiac catheterization for definitive evaluation of coronary anatomy. He verbalizes understanding.  2. Hypertension: Today is not well-controlled. He has forgotten to take his lisinopril today. He was afraid to take it later as if you are ready taken that he would've overdosed. I've advised him to continue to take medications daily as directed to begin a routine of a daily regimen. He verbalizes understanding. I advised him that because of diabetes his blood pressure  should be 120/60.  3. Ongoing tobacco abuse: He has cut down from 2 and half packs a day to half a pack a day. I congratulated him on this endeavor. He is doing his best to quit. Plans on having completely quitting within the next month or 2. I have encouraged him.  4. Diabetes: Following closely with primary care physician for medication adjustments. I've advised him with weight loss and increased exercise we will have to adjust his medications for blood pressure control. He is to continue to follow-up primary care for ongoing diabetes control increase his exercise and manage his diet.   Current medicines are reviewed at length with the patient today.    Labs/ tests ordered today include:  No orders of the defined types were placed in this encounter.    Disposition:   FU with 6 months  Signed, Joni ReiningKathryn Jamerson Vonbargen, NP  09/16/2016 2:39 PM    [facility] [address], [place], [place] [address] Phone: [phone]; Fax: [phone]

## 2016-10-08 DIAGNOSIS — B370 Candidal stomatitis: Secondary | ICD-10-CM | POA: Diagnosis not present

## 2016-10-08 DIAGNOSIS — E119 Type 2 diabetes mellitus without complications: Secondary | ICD-10-CM | POA: Diagnosis not present

## 2016-11-07 LAB — HEMOGLOBIN A1C: Hemoglobin A1C: 11.8

## 2016-11-27 DIAGNOSIS — E782 Mixed hyperlipidemia: Secondary | ICD-10-CM | POA: Diagnosis not present

## 2016-11-27 DIAGNOSIS — E0865 Diabetes mellitus due to underlying condition with hyperglycemia: Secondary | ICD-10-CM | POA: Diagnosis not present

## 2016-11-27 DIAGNOSIS — I10 Essential (primary) hypertension: Secondary | ICD-10-CM | POA: Diagnosis not present

## 2016-11-27 LAB — LIPID PANEL
Cholesterol: 119 (ref 0–200)
HDL: 22 — AB (ref 35–70)
LDL Cholesterol: 21
Triglycerides: 380 — AB (ref 40–160)

## 2016-12-24 ENCOUNTER — Emergency Department (HOSPITAL_COMMUNITY): Payer: 59

## 2016-12-24 ENCOUNTER — Encounter (HOSPITAL_COMMUNITY): Payer: Self-pay | Admitting: Emergency Medicine

## 2016-12-24 ENCOUNTER — Emergency Department (HOSPITAL_COMMUNITY)
Admission: EM | Admit: 2016-12-24 | Discharge: 2016-12-24 | Disposition: A | Discharge status: Home / Self Care | Payer: 59 | Attending: Emergency Medicine | Admitting: Emergency Medicine

## 2016-12-24 DIAGNOSIS — F17210 Nicotine dependence, cigarettes, uncomplicated: Secondary | ICD-10-CM | POA: Diagnosis not present

## 2016-12-24 DIAGNOSIS — I10 Essential (primary) hypertension: Secondary | ICD-10-CM | POA: Diagnosis not present

## 2016-12-24 DIAGNOSIS — R072 Precordial pain: Secondary | ICD-10-CM | POA: Insufficient documentation

## 2016-12-24 DIAGNOSIS — E119 Type 2 diabetes mellitus without complications: Secondary | ICD-10-CM | POA: Insufficient documentation

## 2016-12-24 DIAGNOSIS — Z79899 Other long term (current) drug therapy: Secondary | ICD-10-CM | POA: Insufficient documentation

## 2016-12-24 DIAGNOSIS — Z7982 Long term (current) use of aspirin: Secondary | ICD-10-CM | POA: Diagnosis not present

## 2016-12-24 DIAGNOSIS — R0602 Shortness of breath: Secondary | ICD-10-CM | POA: Insufficient documentation

## 2016-12-24 DIAGNOSIS — Z794 Long term (current) use of insulin: Secondary | ICD-10-CM | POA: Diagnosis not present

## 2016-12-24 DIAGNOSIS — R079 Chest pain, unspecified: Secondary | ICD-10-CM | POA: Diagnosis not present

## 2016-12-24 LAB — CBC
HCT: 48 % (ref 39.0–52.0)
HEMOGLOBIN: 16.3 g/dL (ref 13.0–17.0)
MCH: 30.1 pg (ref 26.0–34.0)
MCHC: 34 g/dL (ref 30.0–36.0)
MCV: 88.7 fL (ref 78.0–100.0)
PLATELETS: 245 10*3/uL (ref 150–400)
RBC: 5.41 MIL/uL (ref 4.22–5.81)
RDW: 12.8 % (ref 11.5–15.5)
WBC: 15 10*3/uL — AB (ref 4.0–10.5)

## 2016-12-24 LAB — COMPREHENSIVE METABOLIC PANEL
ALT: 37 U/L (ref 17–63)
ANION GAP: 10 (ref 5–15)
AST: 29 U/L (ref 15–41)
Albumin: 4.1 g/dL (ref 3.5–5.0)
Alkaline Phosphatase: 114 U/L (ref 38–126)
BUN: 15 mg/dL (ref 6–20)
CHLORIDE: 97 mmol/L — AB (ref 101–111)
CO2: 26 mmol/L (ref 22–32)
Calcium: 9.6 mg/dL (ref 8.9–10.3)
Creatinine, Ser: 0.87 mg/dL (ref 0.61–1.24)
GFR calc non Af Amer: >60 mL/min (ref >60)
Glucose, Bld: 313 mg/dL — ABNORMAL HIGH (ref 65–99)
Potassium: 4 mmol/L (ref 3.5–5.1)
SODIUM: 133 mmol/L — AB (ref 135–145)
Total Bilirubin: 0.7 mg/dL (ref 0.3–1.2)
Total Protein: 7 g/dL (ref 6.5–8.1)

## 2016-12-24 LAB — D-DIMER, QUANTITATIVE: D-Dimer, Quant: 0.39 ug/mL-FEU (ref 0.00–0.50)

## 2016-12-24 LAB — TROPONIN I: Troponin I: <0.03 ng/mL (ref <0.03)

## 2016-12-24 LAB — CBG MONITORING, ED: GLUCOSE-CAPILLARY: 276 mg/dL — AB (ref 65–99)

## 2016-12-24 MED ORDER — GI COCKTAIL ~~LOC~~
30.0000 mL | Freq: Once | ORAL | Status: AC
  Administered 2016-12-24: 30 mL via ORAL
  Filled 2016-12-24: qty 30

## 2016-12-24 MED ORDER — NITROGLYCERIN 0.4 MG SL SUBL: 0.4000 mg | SUBLINGUAL_TABLET | SUBLINGUAL | Status: DC | PRN

## 2016-12-24 MED ORDER — SODIUM CHLORIDE 0.9 % IV BOLUS (SEPSIS)
1000.0000 mL | Freq: Once | INTRAVENOUS | Status: AC
  Administered 2016-12-24: 1000 mL via INTRAVENOUS

## 2016-12-24 MED ORDER — ASPIRIN 81 MG PO CHEW
324.0000 mg | CHEWABLE_TABLET | Freq: Once | ORAL | Status: AC
  Administered 2016-12-24: 324 mg via ORAL
  Filled 2016-12-24: qty 4

## 2016-12-24 NOTE — ED Provider Notes (Signed)
[facility]-EMERGENCY DEPT Provider Note   CSN: [id number] Arrival date & time: 12/24/16  1157     History   Chief Complaint Chief Complaint  Patient presents with  . Chest Pain    HPI Lance [name] is a 41 y.o. male.  HPI Pt was seen at 1225. Per pt, c/o graduau onset and persistence of waxing and waning mid-sternal chest "pain" since waking up this morning at 0700. Pt describes the CP as "pressure," located in his mid-sternal area, and associated with SOB. Pt states the pain will increase for approximately 15 minutes, especially when he sits up, and will decrease when he walks. Pt has not gone away completely. Endorses hx of similar symptoms 4 months ago, with low risk stress test. Pt has not taken any medication to treat his symptoms. Denies palpitations, no cough, no abd pain, no N/V/D, no fevers, no rash.    Past Medical History:  Diagnosis Date  . Essential hypertension   . History of nuclear stress test 08/2016   low risk study  . Hyperlipemia   . Irregular heart beat   . Type 2 diabetes mellitus [facility])     Patient Active Problem List   Diagnosis Date Noted  . DM type 2 (diabetes mellitus, type 2) (HCC) 08/31/2016  . Hyperlipidemia 08/31/2016  . Tobacco use disorder 08/31/2016  . Chest pain 08/31/2016  . Abdominal pain, epigastric 04/21/2013  . Essential hypertension, benign 04/21/2013    Past Surgical History:  Procedure Laterality Date  . CHOLECYSTECTOMY         Home Medications    Prior to Admission medications   Medication Sig Start Date End Date Taking? Authorizing Provider  ALPRAZolam Prudy Feeler) 1 MG tablet Take 1 mg by mouth daily as needed for anxiety.   Yes Historical Provider, MD  aspirin EC 81 MG tablet Take 81 mg by mouth daily.   Yes Historical Provider, MD  atorvastatin (LIPITOR) 80 MG tablet Take 1 tablet (80 mg total) by mouth daily. 09/01/16  Yes Standley Brooking, MD  insulin glargine (LANTUS) 100 UNIT/ML injection Inject 35 Units into the skin 2  (two) times daily.   Yes Historical Provider, MD  lisinopril (PRINIVIL,ZESTRIL) 40 MG tablet Take 40 mg by mouth daily.   Yes Historical Provider, MD  metFORMIN (GLUCOPHAGE-XR) 500 MG 24 hr tablet Take 1,000 mg by mouth 2 (two) times daily.   Yes Historical Provider, MD    Family History Family History  Problem Relation Age of Onset  . Heart disease Mother 89    CABG  . Heart attack Father 34    Social History Social History  Substance Use Topics  . Smoking status: Current Every Day Smoker    Types: Cigarettes  . Smokeless tobacco: Never Used     Comment: 1 - 1 1/2 pack a day.  . Alcohol use No     Allergies   Bee venom and Penicillins   Review of Systems Review of Systems ROS: Statement: All systems negative except as marked or noted in the HPI; Constitutional: Negative for fever and chills. ; ; Eyes: Negative for eye pain, redness and discharge. ; ; ENMT: Negative for ear pain, hoarseness, nasal congestion, sinus pressure and sore throat. ; ; Cardiovascular: +CP, SOB. Negative for palpitations, diaphoresis, and peripheral edema. ; ; Respiratory: Negative for cough, wheezing and stridor. ; ; Gastrointestinal: Negative for nausea, vomiting, diarrhea, abdominal pain, blood in stool, hematemesis, jaundice and rectal bleeding. . ; ; Genitourinary: Negative for dysuria,  flank pain and hematuria. ; ; Musculoskeletal: Negative for back pain and neck pain. Negative for swelling and trauma.; ; Skin: Negative for pruritus, rash, abrasions, blisters, bruising and skin lesion.; ; Neuro: Negative for headache, lightheadedness and neck stiffness. Negative for weakness, altered level of consciousness, altered mental status, extremity weakness, paresthesias, involuntary movement, seizure and syncope.       Physical Exam Updated Vital Signs BP (!) 116/52   Pulse 74   Resp 18   SpO2 96%   Physical Exam 1230: Physical examination:  Nursing notes reviewed; Vital signs and O2 SAT reviewed;   Constitutional: Well developed, Well nourished, Well hydrated, In no acute distress; Head:  Normocephalic, atraumatic; Eyes: EOMI, PERRL, No scleral icterus; ENMT: Mouth and pharynx normal, Mucous membranes moist; Neck: Supple, Full range of motion, No lymphadenopathy; Cardiovascular: Regular rate and rhythm, No gallop; Respiratory: Breath sounds clear & equal bilaterally, No wheezes.  Speaking full sentences with ease, Normal respiratory effort/excursion; Chest: Nontender, Movement normal; Abdomen: Soft, Nontender, Nondistended, Normal bowel sounds; Genitourinary: No CVA tenderness; Extremities: Pulses normal, No tenderness, No edema, No calf edema or asymmetry.; Neuro: AA&Ox3, Major CN grossly intact.  Speech clear. No gross focal motor or sensory deficits in extremities.; Skin: Color normal, Warm, Dry.   ED Treatments / Results  Labs (all labs ordered are listed, but only abnormal results are displayed)   EKG  EKG Interpretation  Date/Time:  Wednesday December 24 2016 12:03:03 EDT Ventricular Rate:  87 PR Interval:    QRS Duration: 94 QT Interval:  362 QTC Calculation: 436 R Axis:   77 Text Interpretation:  Sinus rhythm When compared with ECG of 08/31/2016 No significant change was found Confirmed by [facility]  MD, Nicholos Johns [id number]) on 12/24/2016 1:12:12 PM       Radiology   Procedures Procedures (including critical care time)  Medications Ordered in ED Medications  nitroGLYCERIN (NITROSTAT) SL tablet 0.4 mg (not administered)  sodium chloride 0.9 % bolus 1,000 mL (not administered)  aspirin chewable tablet 324 mg (324 mg Oral Given 12/24/16 1325)  gi cocktail (Maalox,Lidocaine,Donnatal) (30 mLs Oral Given 12/24/16 1325)     Initial Impression / Assessment and Plan / ED Course  I have reviewed the triage vital signs and the nursing notes.  Pertinent labs & imaging results that were available during my care of the patient were reviewed by me and considered in my medical decision  making (see chart for details).  MDM Reviewed: previous chart, nursing note and vitals Reviewed previous: labs and ECG Interpretation: labs, ECG and x-ray   Results for orders placed or performed during the hospital encounter of 12/24/16  CBC  Result Value Ref Range   WBC 15.0 (H) 4.0 - 10.5 K/uL   RBC 5.41 4.22 - 5.81 MIL/uL   Hemoglobin 16.3 13.0 - 17.0 g/dL   HCT 60.4 54.0 - 98.1 %   MCV 88.7 78.0 - 100.0 fL   MCH 30.1 26.0 - 34.0 pg   MCHC 34.0 30.0 - 36.0 g/dL   RDW 19.1 47.8 - 29.5 %   Platelets 245 150 - 400 K/uL  Comprehensive metabolic panel  Result Value Ref Range   Sodium 133 (L) 135 - 145 mmol/L   Potassium 4.0 3.5 - 5.1 mmol/L   Chloride 97 (L) 101 - 111 mmol/L   CO2 26 22 - 32 mmol/L   Glucose, Bld 313 (H) 65 - 99 mg/dL   BUN 15 6 - 20 mg/dL   Creatinine, Ser 6.21 0.61 - 1.24  mg/dL   Calcium 9.6 8.9 - 16.110.3 mg/dL   Total Protein 7.0 6.5 - 8.1 g/dL   Albumin 4.1 3.5 - 5.0 g/dL   AST 29 15 - 41 U/L   ALT 37 17 - 63 U/L   Alkaline Phosphatase 114 38 - 126 U/L   Total Bilirubin 0.7 0.3 - 1.2 mg/dL   GFR calc non Af Amer >60 >60 mL/min   GFR calc Af Amer >60 >60 mL/min   Anion gap 10 5 - 15  Troponin I  Result Value Ref Range   Troponin I <0.03 <0.03 ng/mL  Troponin I  Result Value Ref Range   Troponin I <0.03 <0.03 ng/mL  D-dimer, quantitative  Result Value Ref Range   D-Dimer, Quant 0.39 0.00 - 0.50 ug/mL-FEU  CBG monitoring, ED  Result Value Ref Range   Glucose-Capillary 276 (H) 65 - 99 mg/dL   Dg Chest 2 View Result Date: 12/24/2016 CLINICAL DATA:  Shortness of breath. EXAM: CHEST  2 VIEW COMPARISON:  Radiographs of August 31, 2016. FINDINGS: The heart size and mediastinal contours are within normal limits. Both lungs are clear. No pneumothorax or pleural effusion is noted. The visualized skeletal structures are unremarkable. IMPRESSION: No active cardiopulmonary disease. Electronically Signed   By: Lupita RaiderJames  Green Jr, M.D.   On: 12/24/2016 12:43      1330:  Pt with hx recent low risk myoview stress test. Today's troponin normal and EKG unchanged from previous in setting of constant CP for the past 5+ hours. Will obtain 2nd troponin.  T/C to Cards Dr. Diona BrownerMcDowell, case discussed, including:  HPI, pertinent PM/SHx, VS/PE, dx testing, ED course and treatment:  Agrees with ED treatment, with with atypical CP but with risk factors, if 2nd troponin is negative, pt can be d/c with outpatient Cards f/u.   1545:  CBG trending downward with IVF; AG normal. Pt's CP improved after ASA and GI cocktail. 2nd troponin negative, d-dimer negative. Doubt PE as cause for symptoms with normal d-dimer and low risk Wells.  Doubt ACS as cause for symptoms with normal troponin x2 and unchanged EKG from previous after 8+ hours of constant symptoms. Pt does have several risk factors for ACS and will need close Cards office f/u (already d/w Dr. Diona BrownerMcDowell). Dx and testing, as well as d/w Cards MD, d/w pt.  Questions answered.  Verb understanding, agreeable to d/c home with outpt f/u.     Final Clinical Impressions(s) / ED Diagnoses   Final diagnoses:  None    New Prescriptions New Prescriptions   No medications on file     Samuel JesterKathleen Breton Berns, DO 12/28/16 0050

## 2016-12-24 NOTE — Discharge Instructions (Signed)
Take your usual prescriptions as previously directed.  Call your regular Cardiologist tomorrow morning to schedule a follow up appointment this week.  Return to the Emergency Department immediately if worsening.

## 2016-12-24 NOTE — ED Triage Notes (Signed)
PT c/o central and left sided chest pain that started today with episodes of lightheadedness.

## 2016-12-29 ENCOUNTER — Encounter: Payer: Self-pay | Admitting: Physician Assistant

## 2016-12-29 ENCOUNTER — Ambulatory Visit (INDEPENDENT_AMBULATORY_CARE_PROVIDER_SITE_OTHER): Payer: 59 | Admitting: Physician Assistant

## 2016-12-29 VITALS — BP 128/80 | HR 79 | Ht 71.0 in | Wt 254.0 lb

## 2016-12-29 DIAGNOSIS — Z8249 Family history of ischemic heart disease and other diseases of the circulatory system: Secondary | ICD-10-CM

## 2016-12-29 DIAGNOSIS — F17200 Nicotine dependence, unspecified, uncomplicated: Secondary | ICD-10-CM

## 2016-12-29 DIAGNOSIS — Z794 Long term (current) use of insulin: Secondary | ICD-10-CM

## 2016-12-29 DIAGNOSIS — E784 Other hyperlipidemia: Secondary | ICD-10-CM | POA: Diagnosis not present

## 2016-12-29 DIAGNOSIS — I10 Essential (primary) hypertension: Secondary | ICD-10-CM

## 2016-12-29 DIAGNOSIS — E1122 Type 2 diabetes mellitus with diabetic chronic kidney disease: Secondary | ICD-10-CM

## 2016-12-29 DIAGNOSIS — R079 Chest pain, unspecified: Secondary | ICD-10-CM

## 2016-12-29 DIAGNOSIS — E7849 Other hyperlipidemia: Secondary | ICD-10-CM

## 2016-12-29 NOTE — Progress Notes (Signed)
Cardiology Office Note    Date:  12/29/2016   ID:  Lance [name], DOB 1976-09-25, MRN [id number]  PCP:  [facility]  Cardiologist: Dr. Diona Browner  Chief Complaint  Patient presents with  . Follow-up    History of Present Illness:  Lance [name] is a 41 y.o. male with history of hypertension, diabetes, HLD, tobacco abuse, obesity, family history of CAD. She had an admission in 08/2016 with chest pain and ruled out for an MI. Stress Myoview was negative for ischemia. Prior history of SVT converted with adenosine in the ER 2015.  Patient is here today for post emergency room follow-up. 12/24/16. He went in with chest pain associated with shortness of breath worse when he sits up but decreases when he walks. Troponins were negative and EKG was normal.Patient says it was just a chest tightness that was moving all around but by the time he got to the emergency room it had eased. He was placed on a GI medicine by his family doctor. Doesn't remember what it is. He also thinks it may be stress related. He is trying to quit smoking and 1 from 2 packs a day to 15 cigarettes daily.  Past Medical History:  Diagnosis Date  . Essential hypertension   . History of nuclear stress test 08/2016   low risk study  . Hyperlipemia   . Irregular heart beat   . Type 2 diabetes mellitus (HCC)     Past Surgical History:  Procedure Laterality Date  . CHOLECYSTECTOMY      Current Medications: Outpatient Medications Prior to Visit  Medication Sig Dispense Refill  . ALPRAZolam (XANAX) 1 MG tablet Take 1 mg by mouth daily as needed for anxiety.    Marland Kitchen aspirin EC 81 MG tablet Take 81 mg by mouth daily.    Marland Kitchen atorvastatin (LIPITOR) 80 MG tablet Take 1 tablet (80 mg total) by mouth daily. 30 tablet 0  . insulin glargine (LANTUS) 100 UNIT/ML injection Inject 35 Units into the skin 2 (two) times daily.    Marland Kitchen lisinopril (PRINIVIL,ZESTRIL) 40 MG tablet Take 40 mg by mouth daily.    . metFORMIN  (GLUCOPHAGE-XR) 500 MG 24 hr tablet Take 1,000 mg by mouth 2 (two) times daily.     No facility-administered medications prior to visit.      Allergies:   Bee venom and Penicillins   Social History   Social History  . Marital status: Single    Spouse name: N/A  . Number of children: N/A  . Years of education: N/A   Social History Main Topics  . Smoking status: Current Every Day Smoker    Packs/day: 0.75    Types: Cigarettes  . Smokeless tobacco: Never Used     Comment: 1 - 1 1/2 pack a day.  . Alcohol use No  . Drug use: No  . Sexual activity: Yes    Birth control/ protection: None   Other Topics Concern  . None   Social History Narrative  . None     Family History:  The patient's   family history includes Heart attack (age of onset: 32) in his father; Heart disease (age of onset: 82) in his mother.   ROS:   Please see the history of present illness.    Review of Systems  Constitution: Negative.  HENT: Negative.   Cardiovascular: Negative.   Respiratory: Negative.   Endocrine: Negative.   Hematologic/Lymphatic: Negative.   Musculoskeletal: Negative.   Gastrointestinal: Negative.  Genitourinary: Negative.   Neurological: Negative.    All other systems reviewed and are negative.   PHYSICAL EXAM:   VS:  BP 128/80   Pulse 79   Ht 5\' 11"  (1.803 m)   Wt 254 lb (115.2 kg)   SpO2 97%   BMI 35.43 kg/m   Physical Exam  GEN: Obese, in no acute distress  Neck: no JVD, carotid bruits, or masses Cardiac:RRR; no murmurs, rubs, or gallops  Respiratory:  clear to auscultation bilaterally, normal work of breathing GI: soft, nontender, nondistended, + BS Ext: without cyanosis, clubbing, or edema, Good distal pulses bilaterally Psych: euthymic mood, full affect  Wt Readings from Last 3 Encounters:  12/29/16 254 lb (115.2 kg)  09/16/16 252 lb (114.3 kg)  08/31/16 252 lb (114.3 kg)      Studies/Labs Reviewed:   EKG:  EKG is not ordered today.  EKG reviewed from  12/25/16 and was completely normal  Recent Labs: 12/24/2016: ALT 37; BUN 15; Creatinine, Ser 0.87; Hemoglobin 16.3; Platelets 245; Potassium 4.0; Sodium 133   Lipid Panel    Component Value Date/Time   CHOL 267 (H) 09/01/2016 0608   TRIG 1,142 (H) 09/01/2016 0608   HDL 24 (L) 09/01/2016 0608   CHOLHDL 11.1 09/01/2016 0608   VLDL UNABLE TO CALCULATE IF TRIGLYCERIDE OVER 400 mg/dL 16/10/960411/27/2017 [id number]   LDLCALC UNABLE TO CALCULATE IF TRIGLYCERIDE OVER 400 mg/dL 81/19/147811/27/2017 [id number]    Additional studies/ records that were reviewed today include:    Echocardiogram 09/01/2016 Left ventricle: The cavity size was normal. Wall thickness was   increased in a pattern of mild LVH. Systolic function was normal.   The estimated ejection fraction was in the range of 60% to 65%.   Wall motion was normal; there were no regional wall motion   abnormalities. Doppler parameters are consistent with abnormal   left ventricular relaxation (grade 1 diastolic dysfunction). - Aortic valve: Trileaflet; mildly calcified leaflets. There was no   significant regurgitation. - Mitral valve: There was trivial regurgitation. - Right atrium: Central venous pressure (est): 3 mm Hg. - Tricuspid valve: There was physiologic regurgitation. - Pericardium, extracardiac: A prominent pericardial fat pad was   present.     09/01/2016  No diagnostic ST segment changes to indicate ischemia. No significant arrhythmias.  Blood pressure demonstrated a hypertensive response to exercise. Lexiscan also utilized given poor exercise tolerance.  No significant myocardial perfusion defect extending indicate scar or ischemia.  This is a low risk study.  Nuclear stress EF: 67%.   ASSESSMENT:    1. Chest pain, unspecified type   2. Essential hypertension, benign   3. Tobacco use disorder   4. Other hyperlipidemia   5. Type 2 diabetes mellitus with chronic kidney disease, with long-term current use of insulin, unspecified CKD stage  (HCC)   6. Family history of early CAD      PLAN:  In order of problems listed above:  Chest pain with negative Lexi scan Myoview 08/2016 and recent ER visit with normal EKG and negative troponins. Patient has had no pain since then. He does have multiple cardiac risk factors for CAD including a strong family history. Was recently started on GI medicines. Recommend he continue this and if he has any further chest pain to call us. Follow-up with Dr. Diona BrownerMcDowell in 4 months.  Essential hypertension controlled on lisinopril  Tobacco abuse smoking cessation is essential  Hyperlipidemia on Lipitor 80 mg daily  Diabetes mellitus on insulin and metformin  Strong  family history of early CAD: Risk factor modification essential    Medication Adjustments/Labs and Tests Ordered: Current medicines are reviewed at length with the patient today.  Concerns regarding medicines are outlined above.  Medication changes, Labs and Tests ordered today are listed in the Patient Instructions below. Patient Instructions  Your physician recommends that you schedule a follow-up appointment in: 3-4 months with Dr. Diona Browner   Please try to stop smoking.  Your physician recommends that you continue on your current medications as directed. Please refer to the Current Medication list given to you today.    If you need a refill on your cardiac medications before your next appointment, please call your pharmacy.     Thank you for choosing [facility] !            Elson Clan, PA-C  12/29/2016 12:37 PM    [facility] [address] [place], [place], [place]  [address] Phone: [phone]; Fax: [phone]

## 2016-12-29 NOTE — Patient Instructions (Signed)
Your physician recommends that you schedule a follow-up appointment in: 3-4 months with Dr. Diona BrownerMcDowell   Please try to stop smoking.  Your physician recommends that you continue on your current medications as directed. Please refer to the Current Medication list given to you today.    If you need a refill on your cardiac medications before your next appointment, please call your pharmacy.     Thank you for choosing [facility] !

## 2017-02-26 DIAGNOSIS — E782 Mixed hyperlipidemia: Secondary | ICD-10-CM | POA: Diagnosis not present

## 2017-02-26 DIAGNOSIS — E0865 Diabetes mellitus due to underlying condition with hyperglycemia: Secondary | ICD-10-CM | POA: Diagnosis not present

## 2017-05-10 ENCOUNTER — Emergency Department (HOSPITAL_COMMUNITY): Payer: 59

## 2017-05-10 ENCOUNTER — Encounter (HOSPITAL_COMMUNITY): Payer: Self-pay

## 2017-05-10 ENCOUNTER — Observation Stay (HOSPITAL_COMMUNITY)
Admission: EM | Admit: 2017-05-10 | Discharge: 2017-05-12 | Disposition: A | Discharge status: Home / Self Care | Payer: 59 | Attending: Internal Medicine | Admitting: Internal Medicine

## 2017-05-10 DIAGNOSIS — E119 Type 2 diabetes mellitus without complications: Secondary | ICD-10-CM

## 2017-05-10 DIAGNOSIS — IMO0002 Reserved for concepts with insufficient information to code with codable children: Secondary | ICD-10-CM

## 2017-05-10 DIAGNOSIS — Z794 Long term (current) use of insulin: Secondary | ICD-10-CM

## 2017-05-10 DIAGNOSIS — E785 Hyperlipidemia, unspecified: Secondary | ICD-10-CM | POA: Diagnosis not present

## 2017-05-10 DIAGNOSIS — D72829 Elevated white blood cell count, unspecified: Secondary | ICD-10-CM | POA: Diagnosis present

## 2017-05-10 DIAGNOSIS — Z720 Tobacco use: Secondary | ICD-10-CM | POA: Diagnosis present

## 2017-05-10 DIAGNOSIS — D72828 Other elevated white blood cell count: Secondary | ICD-10-CM | POA: Diagnosis not present

## 2017-05-10 DIAGNOSIS — Z7982 Long term (current) use of aspirin: Secondary | ICD-10-CM | POA: Diagnosis not present

## 2017-05-10 DIAGNOSIS — R079 Chest pain, unspecified: Principal | ICD-10-CM | POA: Diagnosis present

## 2017-05-10 DIAGNOSIS — Z7984 Long term (current) use of oral hypoglycemic drugs: Secondary | ICD-10-CM | POA: Diagnosis not present

## 2017-05-10 DIAGNOSIS — F17210 Nicotine dependence, cigarettes, uncomplicated: Secondary | ICD-10-CM | POA: Insufficient documentation

## 2017-05-10 DIAGNOSIS — Z79899 Other long term (current) drug therapy: Secondary | ICD-10-CM | POA: Insufficient documentation

## 2017-05-10 DIAGNOSIS — R0789 Other chest pain: Secondary | ICD-10-CM | POA: Diagnosis not present

## 2017-05-10 DIAGNOSIS — F17200 Nicotine dependence, unspecified, uncomplicated: Secondary | ICD-10-CM | POA: Diagnosis not present

## 2017-05-10 DIAGNOSIS — E1165 Type 2 diabetes mellitus with hyperglycemia: Secondary | ICD-10-CM

## 2017-05-10 DIAGNOSIS — E1122 Type 2 diabetes mellitus with diabetic chronic kidney disease: Secondary | ICD-10-CM

## 2017-05-10 DIAGNOSIS — I10 Essential (primary) hypertension: Secondary | ICD-10-CM | POA: Diagnosis present

## 2017-05-10 DIAGNOSIS — R0602 Shortness of breath: Secondary | ICD-10-CM | POA: Diagnosis not present

## 2017-05-10 DIAGNOSIS — E782 Mixed hyperlipidemia: Secondary | ICD-10-CM | POA: Diagnosis present

## 2017-05-10 LAB — URINALYSIS, ROUTINE W REFLEX MICROSCOPIC
BACTERIA UA: NONE SEEN
BILIRUBIN URINE: NEGATIVE
Glucose, UA: >=500 mg/dL — AB
HGB URINE DIPSTICK: NEGATIVE
KETONES UR: NEGATIVE mg/dL
LEUKOCYTES UA: NEGATIVE
NITRITE: NEGATIVE
Protein, ur: NEGATIVE mg/dL
Specific Gravity, Urine: 1.029 (ref 1.005–1.030)
Squamous Epithelial / LPF: NONE SEEN
WBC UA: NONE SEEN WBC/hpf (ref 0–5)
pH: 6 (ref 5.0–8.0)

## 2017-05-10 LAB — BASIC METABOLIC PANEL
Anion gap: 10 (ref 5–15)
BUN: 17 mg/dL (ref 6–20)
CHLORIDE: 100 mmol/L — AB (ref 101–111)
CO2: 24 mmol/L (ref 22–32)
Calcium: 9.8 mg/dL (ref 8.9–10.3)
Creatinine, Ser: 0.88 mg/dL (ref 0.61–1.24)
GFR calc Af Amer: >60 mL/min (ref >60)
GFR calc non Af Amer: >60 mL/min (ref >60)
GLUCOSE: 307 mg/dL — AB (ref 65–99)
POTASSIUM: 4.2 mmol/L (ref 3.5–5.1)
Sodium: 134 mmol/L — ABNORMAL LOW (ref 135–145)

## 2017-05-10 LAB — RAPID URINE DRUG SCREEN, HOSP PERFORMED
Amphetamines: NOT DETECTED
Barbiturates: NOT DETECTED
Benzodiazepines: NOT DETECTED
COCAINE: NOT DETECTED
OPIATES: NOT DETECTED
Tetrahydrocannabinol: NOT DETECTED

## 2017-05-10 LAB — D-DIMER, QUANTITATIVE (NOT AT ARMC)

## 2017-05-10 LAB — CBC WITH DIFFERENTIAL/PLATELET
Basophils Absolute: 0 10*3/uL (ref 0.0–0.1)
Basophils Relative: 0 %
EOS PCT: 4 %
Eosinophils Absolute: 0.6 10*3/uL (ref 0.0–0.7)
HCT: 47 % (ref 39.0–52.0)
HEMOGLOBIN: 16.4 g/dL (ref 13.0–17.0)
LYMPHS ABS: 4.3 10*3/uL — AB (ref 0.7–4.0)
Lymphocytes Relative: 30 %
MCH: 30.3 pg (ref 26.0–34.0)
MCHC: 34.9 g/dL (ref 30.0–36.0)
MCV: 86.7 fL (ref 78.0–100.0)
MONOS PCT: 5 %
Monocytes Absolute: 0.8 10*3/uL (ref 0.1–1.0)
NEUTROS ABS: 9 10*3/uL — AB (ref 1.7–7.7)
NEUTROS PCT: 61 %
Platelets: 213 10*3/uL (ref 150–400)
RBC: 5.42 MIL/uL (ref 4.22–5.81)
RDW: 12.5 % (ref 11.5–15.5)
WBC: 14.7 10*3/uL — AB (ref 4.0–10.5)

## 2017-05-10 LAB — GLUCOSE, CAPILLARY
Glucose-Capillary: 230 mg/dL — ABNORMAL HIGH (ref 65–99)
Glucose-Capillary: 394 mg/dL — ABNORMAL HIGH (ref 65–99)

## 2017-05-10 LAB — TROPONIN I
Troponin I: <0.03 ng/mL (ref <0.03)
Troponin I: <0.03 ng/mL (ref <0.03)

## 2017-05-10 LAB — LIPASE, BLOOD: Lipase: 27 U/L (ref 11–51)

## 2017-05-10 LAB — TSH: TSH: 1.318 u[IU]/mL (ref 0.350–4.500)

## 2017-05-10 LAB — MAGNESIUM: Magnesium: 1.9 mg/dL (ref 1.7–2.4)

## 2017-05-10 MED ORDER — INSULIN DETEMIR 100 UNIT/ML ~~LOC~~ SOLN
55.0000 [IU] | Freq: Two times a day (BID) | SUBCUTANEOUS | Status: DC
  Administered 2017-05-10: 55 [IU] via SUBCUTANEOUS
  Filled 2017-05-10 (×2): qty 0.55

## 2017-05-10 MED ORDER — ASPIRIN EC 81 MG PO TBEC: 81.0000 mg | DELAYED_RELEASE_TABLET | Freq: Every day | ORAL | Status: DC

## 2017-05-10 MED ORDER — ASPIRIN 81 MG PO CHEW
324.0000 mg | CHEWABLE_TABLET | Freq: Once | ORAL | Status: AC
  Administered 2017-05-10: 324 mg via ORAL
  Filled 2017-05-10: qty 4

## 2017-05-10 MED ORDER — INSULIN ASPART 100 UNIT/ML ~~LOC~~ SOLN
0.0000 [IU] | Freq: Three times a day (TID) | SUBCUTANEOUS | Status: DC
  Administered 2017-05-10: 7 [IU] via SUBCUTANEOUS
  Administered 2017-05-11: 15 [IU] via SUBCUTANEOUS
  Administered 2017-05-11: 11 [IU] via SUBCUTANEOUS
  Administered 2017-05-11: 15 [IU] via SUBCUTANEOUS
  Administered 2017-05-12 (×2): 7 [IU] via SUBCUTANEOUS

## 2017-05-10 MED ORDER — CARVEDILOL 3.125 MG PO TABS
3.1250 mg | ORAL_TABLET | Freq: Two times a day (BID) | ORAL | Status: DC
  Administered 2017-05-10 – 2017-05-12 (×4): 3.125 mg via ORAL
  Filled 2017-05-10 (×4): qty 1

## 2017-05-10 MED ORDER — ASPIRIN EC 81 MG PO TBEC
81.0000 mg | DELAYED_RELEASE_TABLET | Freq: Every day | ORAL | Status: DC
  Administered 2017-05-11 – 2017-05-12 (×2): 81 mg via ORAL
  Filled 2017-05-10 (×2): qty 1

## 2017-05-10 MED ORDER — ENOXAPARIN SODIUM 40 MG/0.4ML ~~LOC~~ SOLN
40.0000 mg | SUBCUTANEOUS | Status: DC
  Administered 2017-05-10: 40 mg via SUBCUTANEOUS
  Filled 2017-05-10 (×2): qty 0.4

## 2017-05-10 MED ORDER — SODIUM CHLORIDE 0.9 % IV BOLUS (SEPSIS)
500.0000 mL | Freq: Once | INTRAVENOUS | Status: AC
  Administered 2017-05-10: 500 mL via INTRAVENOUS

## 2017-05-10 MED ORDER — NITROGLYCERIN 0.4 MG SL SUBL: 0.4000 mg | SUBLINGUAL_TABLET | SUBLINGUAL | Status: DC | PRN

## 2017-05-10 MED ORDER — ASPIRIN 300 MG RE SUPP: 300.0000 mg | RECTAL | Status: AC

## 2017-05-10 MED ORDER — ATORVASTATIN CALCIUM 40 MG PO TABS
80.0000 mg | ORAL_TABLET | Freq: Every day | ORAL | Status: DC
  Administered 2017-05-10 – 2017-05-11 (×2): 80 mg via ORAL
  Filled 2017-05-10 (×2): qty 2

## 2017-05-10 MED ORDER — EZETIMIBE 10 MG PO TABS
10.0000 mg | ORAL_TABLET | Freq: Every day | ORAL | Status: DC
  Administered 2017-05-11 – 2017-05-12 (×2): 10 mg via ORAL
  Filled 2017-05-10 (×2): qty 1

## 2017-05-10 MED ORDER — LISINOPRIL 10 MG PO TABS
40.0000 mg | ORAL_TABLET | Freq: Every day | ORAL | Status: DC
  Administered 2017-05-11 – 2017-05-12 (×2): 40 mg via ORAL
  Filled 2017-05-10 (×2): qty 4

## 2017-05-10 MED ORDER — ONDANSETRON HCL 4 MG/2ML IJ SOLN: 4.0000 mg | Freq: Four times a day (QID) | INTRAMUSCULAR | Status: DC | PRN

## 2017-05-10 MED ORDER — ACETAMINOPHEN 325 MG PO TABS: 650.0000 mg | ORAL_TABLET | ORAL | Status: DC | PRN

## 2017-05-10 MED ORDER — ASPIRIN 81 MG PO CHEW: 324.0000 mg | CHEWABLE_TABLET | ORAL | Status: AC

## 2017-05-10 MED ORDER — INSULIN DETEMIR 100 UNIT/ML ~~LOC~~ SOLN
5.0000 [IU] | Freq: Once | SUBCUTANEOUS | Status: AC
  Administered 2017-05-10: 5 [IU] via SUBCUTANEOUS
  Filled 2017-05-10: qty 0.05

## 2017-05-10 MED ORDER — SODIUM CHLORIDE 0.9 % IV SOLN
INTRAVENOUS | Status: AC
  Administered 2017-05-10 – 2017-05-11 (×2): via INTRAVENOUS

## 2017-05-10 MED ORDER — SODIUM CHLORIDE 0.9 % IV BOLUS (SEPSIS)
1000.0000 mL | Freq: Once | INTRAVENOUS | Status: AC
  Administered 2017-05-10: 1000 mL via INTRAVENOUS

## 2017-05-10 MED ORDER — ALPRAZOLAM 1 MG PO TABS: 1.0000 mg | ORAL_TABLET | Freq: Every day | ORAL | Status: DC | PRN

## 2017-05-10 NOTE — ED Provider Notes (Signed)
[facility]-EMERGENCY DEPT Provider Note   CSN: [id number] Arrival date & time: 05/10/17  1218     History   Chief Complaint Chief Complaint  Patient presents with  . Chest Pain    HPI Lance [name] is a 41 y.o. male.  HPI    This is a 41 year old man history of type 2 diabetes, A. fib, hypertension, hyperlipidemia, smoker with family history of cardiac disease who presents today with intermittent chest pain that began on Friday. He states it is exacerbated by exertion and relieved with rest. He gets associated dyspnea. He denies any lightheaded, weakness, fever, or chills. He states he has had a cough that is productive of of clear sputum. He is currently pain-free and took his 81 mg dose of aspirin this morning Past Medical History:  Diagnosis Date  . Essential hypertension   . History of nuclear stress test 08/2016   low risk study  . Hyperlipemia   . Irregular heart beat   . Type 2 diabetes mellitus [facility](HCC)     Patient Active Problem List   Diagnosis Date Noted  . Family history of early CAD 12/29/2016  . DM type 2 (diabetes mellitus, type 2) (HCC) 08/31/2016  . Hyperlipidemia 08/31/2016  . Tobacco use disorder 08/31/2016  . Chest pain 08/31/2016  . Abdominal pain, epigastric 04/21/2013  . Essential hypertension, benign 04/21/2013    Past Surgical History:  Procedure Laterality Date  . CHOLECYSTECTOMY         Home Medications    Prior to Admission medications   Medication Sig Start Date End Date Taking? Authorizing Provider  ALPRAZolam Prudy Feeler(XANAX) 1 MG tablet Take 1 mg by mouth daily as needed for anxiety.   Yes [provider]  aspirin EC 81 MG tablet Take 81 mg by mouth daily.   Yes [provider]  atorvastatin (LIPITOR) 80 MG tablet Take 1 tablet (80 mg total) by mouth daily. 09/01/16  Yes Standley BrookingGoodrich, Daniel P, MD  Insulin Detemir (LEVEMIR FLEXPEN [place]) Inject 50 Units into the skin 2 (two) times daily.   Yes [provider]  lisinopril  (PRINIVIL,ZESTRIL) 40 MG tablet Take 40 mg by mouth daily.   Yes [provider]  metFORMIN (GLUCOPHAGE-XR) 500 MG 24 hr tablet Take 1,000 mg by mouth 2 (two) times daily.   Yes [provider]    Family History Family History  Problem Relation Age of Onset  . Heart disease Mother 8562       CABG  . Heart attack Father 7157    Social History Social History  Substance Use Topics  . Smoking status: Current Some Day Smoker    Packs/day: 0.75    Types: Cigarettes  . Smokeless tobacco: Never Used     Comment: 1 - 1 1/2 pack a day.  . Alcohol use No     Allergies   Bee venom and Penicillins   Review of Systems Review of Systems  All other systems reviewed and are negative.    Physical Exam Updated Vital Signs BP 116/71   Pulse 82   Temp 98.5 F (36.9 C) (Oral)   Resp 15   Ht 1.803 m (5\' 11" )   Wt 108.9 kg (240 lb)   SpO2 97%   BMI 33.47 kg/m   Physical Exam  Constitutional: He is oriented to person, place, and time. He appears well-developed and well-nourished.  HENT:  Head: Normocephalic and atraumatic.  Right Ear: External ear normal.  Left Ear: External ear normal.  Nose: Nose normal.  Eyes: EOM are normal.  Neck: Normal range of motion. Neck supple. No tracheal deviation present.  Cardiovascular: Normal rate, regular rhythm, normal heart sounds and intact distal pulses.   Pulmonary/Chest: Effort normal and breath sounds normal.  Abdominal: Soft. Bowel sounds are normal.  Musculoskeletal: Normal range of motion. He exhibits edema.  Neurological: He is alert and oriented to person, place, and time.  Skin: Skin is warm and dry. Capillary refill takes less than 2 seconds.  Psychiatric: He has a normal mood and affect. His behavior is normal.  Nursing note and vitals reviewed.    ED Treatments / Results  Labs (all labs ordered are listed, but only abnormal results are displayed) Labs Reviewed  CBC WITH DIFFERENTIAL/PLATELET - Abnormal;  Notable for the following:       Result Value   WBC 14.7 (*)    Neutro Abs 9.0 (*)    Lymphs Abs 4.3 (*)    All other components within normal limits  BASIC METABOLIC PANEL - Abnormal; Notable for the following:    Sodium 134 (*)    Chloride 100 (*)    Glucose, Bld 307 (*)    All other components within normal limits  TROPONIN I    EKG  EKG Interpretation  Date/Time:  Sunday May 10 2017 12:28:12 EDT Ventricular Rate:  73 PR Interval:    QRS Duration: 95 QT Interval:  382 QTC Calculation: 421 R Axis:   78 Text Interpretation:  Normal sinus rhythm No significant change since last tracing Confirmed by Margarita Grizzleay, Riad Wagley [id number]) on 05/10/2017 12:44:17 PM       Radiology Dg Chest 2 View  Result Date: 05/10/2017 CLINICAL DATA:  Chest pain and shortness of breath for 1 week. EXAM: CHEST  2 VIEW COMPARISON:  12/24/2016 FINDINGS: The heart size and mediastinal contours are within normal limits. Both lungs are clear. The visualized skeletal structures are unremarkable. IMPRESSION: No active cardiopulmonary disease. Electronically Signed   By: Myles RosenthalJohn  Stahl M.D.   On: 05/10/2017 13:08    Procedures Procedures (including critical care time)  Medications Ordered in ED Medications  aspirin chewable tablet 324 mg (not administered)     Initial Impression / Assessment and Plan / ED Course  I have reviewed the triage vital signs and the nursing notes.  Pertinent labs & imaging results that were available during my care of the patient were reviewed by me and considered in my medical decision making (see chart for details).    41 year old man with multiple risk factors for coronary artery disease. He is having exertional chest pain. He has had an evaluation in November 17 with no wall motion abnormalities and normal myocardial perfusion study. However, given patient's multiple risk factors and suspicious type pain, I feel that overnight CYCLING of enzymes would be appropriate.  Discussed  with Dr. Janee Mornhompson, plan obs. D-dimer and lipase added per hs reccomendation.   Final Clinical Impressions(s) / ED Diagnoses   Final diagnoses:  Chest pain, unspecified type    New Prescriptions New Prescriptions   No medications on file     Margarita Grizzleay, Taleisha Kaczynski, MD 05/10/17 1404

## 2017-05-10 NOTE — ED Triage Notes (Signed)
Pt c/o chest pain and pressure since Friday.  Reports SOB.  Denies any n/v.  History of afib.

## 2017-05-10 NOTE — H&P (Signed)
History and Physical    Lance HaffJustin C Murton RUE:[id number] DOB: 12/18/1975 DOA: 05/10/2017  PCP: [facility]  Patient coming from: Home   Chief Complaint: Chest pressure  HPI: Lance [name] is a 41 y.o. male with medical history significant of poorly controlled type 2 diabetes, hypertension, hyperlipidemia, negative Myoview stress test November 2017, family history of coronary artery disease, history of atrial fibrillation, ongoing tobacco abuse presented to the ED with a 2-3 day history of substernal intermittent pressure on exertion with no radiation, no diaphoresis, no fever, no chills. She states pain improves after approximately 10 minutes of rest. Patient does endorse some shortness of breath as well. Patient denies any hematuria, no syncope, no dizziness, no weakness, no recent surgeries, no recent trips. Patient states chest pain usually lacks approximately 10 minutes and is improved with rest. Patient denies chest pain being reproducible. Patient denies any ongoing daily out, all use.  ED Course: Patient seen in the ED. Basic metabolic profile obtained had a sodium of 134 chloride of 100 glucose of 307 otherwise was within normal limits. EKG with normal sinus rhythm. CBC had a white count of 14.7 otherwise was within normal limits. Patient was given a full dose aspirin on presentation to the ED denies any further chest pain.  Review of Systems: As per HPI otherwise 10 point review of systems negative.  Past Medical History:  Diagnosis Date  . Essential hypertension   . History of nuclear stress test 08/2016   low risk study  . Hyperlipemia   . Irregular heart beat   . Type 2 diabetes mellitus (HCC)     Past Surgical History:  Procedure Laterality Date  . CHOLECYSTECTOMY       reports that he has been smoking Cigarettes.  He has been smoking about 0.75 packs per day. He has never used smokeless tobacco. He reports that he does not drink alcohol or use drugs.  Allergies   Allergen Reactions  . Bee Venom Swelling  . Penicillins Itching and Rash    Has patient had a PCN reaction causing immediate rash, facial/tongue/throat swelling, SOB or lightheadedness with hypotension: Yes Has patient had a PCN reaction causing severe rash involving mucus membranes or skin necrosis: No Has patient had a PCN reaction that required hospitalization: No Has patient had a PCN reaction occurring within the last 10 years: No If all of the above answers are "NO", then may proceed with Cephalosporin use.     Family History  Problem Relation Age of Onset  . Heart disease Mother 3062       CABG  . Diabetes Mother   . Gout Mother   . Heart attack Father 1657   Mother alive age 41 history of coronary artery disease status post CABG, gout, diabetes. Patient states father has been deceased from an acute MI, age unknown per patient.  Prior to Admission medications   Medication Sig Start Date End Date Taking? Authorizing Provider  ALPRAZolam Prudy Feeler(XANAX) 1 MG tablet Take 1 mg by mouth daily as needed for anxiety.   Yes [provider]  aspirin EC 81 MG tablet Take 81 mg by mouth daily.   Yes [provider]  atorvastatin (LIPITOR) 80 MG tablet Take 1 tablet (80 mg total) by mouth daily. 09/01/16  Yes Standley BrookingGoodrich, Damyon Mullane P, MD  Insulin Detemir (LEVEMIR FLEXPEN [place]) Inject 50 Units into the skin 2 (two) times daily.   Yes [provider]  lisinopril (PRINIVIL,ZESTRIL) 40 MG tablet Take 40 mg  by mouth daily.   Yes [provider]  metFORMIN (GLUCOPHAGE-XR) 500 MG 24 hr tablet Take 1,000 mg by mouth 2 (two) times daily.   Yes [provider]    Physical Exam: Vitals:   05/10/17 1300 05/10/17 1330 05/10/17 1400 05/10/17 1430  BP: 114/75 116/71 (!) 111/55 (!) 123/57  Pulse: 75 82 70 66  Resp: 18 15 17 16   Temp:      TempSrc:      SpO2: 97% 97% 97% 96%  Weight:      Height:        Constitutional: NAD, calm, comfortable Vitals:   05/10/17 1300  05/10/17 1330 05/10/17 1400 05/10/17 1430  BP: 114/75 116/71 (!) 111/55 (!) 123/57  Pulse: 75 82 70 66  Resp: 18 15 17 16   Temp:      TempSrc:      SpO2: 97% 97% 97% 96%  Weight:      Height:       Eyes: PERRLA, lids and conjunctivae normal ENMT: Mucous membranes are dry. Posterior pharynx clear of any exudate or lesions.Normal dentition.  Neck: normal, supple, no masses, no thyromegaly Respiratory: clear to auscultation bilaterally, no wheezing, no crackles. Normal respiratory effort. No accessory muscle use.  Cardiovascular: Regular rate and rhythm, no murmurs / rubs / gallops. No extremity edema. 2+ pedal pulses. No carotid bruits.  Abdomen: no tenderness, no masses palpated. No hepatosplenomegaly. Bowel sounds positive.  Musculoskeletal: no clubbing / cyanosis. No joint deformity upper and lower extremities. Good ROM, no contractures. Normal muscle tone.  Skin: no rashes, lesions, ulcers. No induration Neurologic: CN 2-12 grossly intact. Sensation intact, DTR normal. Strength 5/5 in all 4.  Psychiatric: Normal judgment and insight. Alert and oriented x 3. Normal mood.    Labs on Admission: I have personally reviewed following labs and imaging studies  CBC:  Recent Labs Lab 05/10/17 1236  WBC 14.7*  NEUTROABS 9.0*  HGB 16.4  HCT 47.0  MCV 86.7  PLT 213   Basic Metabolic Panel:  Recent Labs Lab 05/10/17 1236  NA 134*  K 4.2  CL 100*  CO2 24  GLUCOSE 307*  BUN 17  CREATININE 0.88  CALCIUM 9.8   GFR: Estimated Creatinine Clearance: 138.6 mL/min (by C-G formula based on SCr of 0.88 mg/dL). Liver Function Tests: No results for input(s): AST, ALT, ALKPHOS, BILITOT, PROT, ALBUMIN in the last 168 hours. No results for input(s): LIPASE, AMYLASE in the last 168 hours. No results for input(s): AMMONIA in the last 168 hours. Coagulation Profile: No results for input(s): INR, PROTIME in the last 168 hours. Cardiac Enzymes:  Recent Labs Lab 05/10/17 1236  TROPONINI  <0.03   BNP (last 3 results) No results for input(s): PROBNP in the last 8760 hours. HbA1C: No results for input(s): HGBA1C in the last 72 hours. CBG: No results for input(s): GLUCAP in the last 168 hours. Lipid Profile: No results for input(s): CHOL, HDL, LDLCALC, TRIG, CHOLHDL, LDLDIRECT in the last 72 hours. Thyroid Function Tests: No results for input(s): TSH, T4TOTAL, FREET4, T3FREE, THYROIDAB in the last 72 hours. Anemia Panel: No results for input(s): VITAMINB12, FOLATE, FERRITIN, TIBC, IRON, RETICCTPCT in the last 72 hours. Urine analysis:    Component Value Date/Time   COLORURINE YELLOW 05/26/2014 0005   APPEARANCEUR CLEAR 05/26/2014 0005   LABSPEC >1.030 (H) 05/26/2014 0005   PHURINE 5.0 05/26/2014 0005   GLUCOSEU 250 (A) 05/26/2014 0005   HGBUR SMALL (A) 05/26/2014 0005   BILIRUBINUR NEGATIVE 05/26/2014 0005  KETONESUR NEGATIVE 05/26/2014 0005   PROTEINUR 100 (A) 05/26/2014 0005   UROBILINOGEN 0.2 05/26/2014 0005   NITRITE NEGATIVE 05/26/2014 0005   LEUKOCYTESUR NEGATIVE 05/26/2014 0005    Radiological Exams on Admission: Dg Chest 2 View  Result Date: 05/10/2017 CLINICAL DATA:  Chest pain and shortness of breath for 1 week. EXAM: CHEST  2 VIEW COMPARISON:  12/24/2016 FINDINGS: The heart size and mediastinal contours are within normal limits. Both lungs are clear. The visualized skeletal structures are unremarkable. IMPRESSION: No active cardiopulmonary disease. Electronically Signed   By: Myles Rosenthal M.D.   On: 05/10/2017 13:08    EKG: Independently reviewed. NSR  Assessment/Plan Principal Problem:   Chest pain Active Problems:   Essential hypertension, benign   DM type 2 (diabetes mellitus, type 2) (HCC)   Hyperlipidemia   Tobacco use disorder   Leukocytosis   #1 chest pain Patient presented with chest pain described as a pressure substernally with no radiation with associated shortness of breath on going for the past 2-3 days. Chest pain occurs on  exertion and improves with rest. Patient with multiple risk factors of hypertension, poorly controlled type 2 diabetes, hyperlipidemia, tobacco use disorder, family history of coronary artery disease. Patient given an aspirin. Patient currently chest pain-free. Will admit to telemetry under observation. Cycle cardiac enzymes every 6 hours 3. Check a 2-D echo. Check a fasting lipid panel. Check a hemoglobin A1c. Check a TSH. Continue aspirin, Lipitor. Patient had a Myoview stress test done November 2017 that was low risk. Patient also had a 2-D echo done at that time EF was 60-65%, no wall motion abnormalities, grade 1 diastolic dysfunction. Due to patient's multiple risk factors and recent negative Myoview stress may need a cardiac catheterization for further evaluation and management however will defer to cardiology. Consult with cardiology.  #2 poorly controlled type 2 diabetes mellitus Hemoglobin A1c was 11.1 on 08/31/2016. Patient states ever since he was changed from Lantus to Levemir his blood sugars have and difficult to control. Patient's CBGs in the 300s. Increase home dose Levemir to 55 units twice daily. Discontinue metformin. Sliding scale insulin.  #3 hyperlipidemia Check a fasting lipid panel. Continue current statin dose. May need to add another medication Zetia.  #4 leukocytosis Seems chronic in nature. Patient with no respiratory symptoms. Chest x-ray is negative for any acute infiltrate. Check a UA with cultures and sensitivities. Monitor.  #5 tobacco use disorder Tobacco cessation.   DVT prophylaxis:Lovenox Code Status: Full Family Communication: No family at bedside. Updated patient. Disposition Plan: Home once workup is complete and okay with cardiology. Consults called: Cardiology Admission status: Placed in observation.   [facility] MD [facility] Pager [phone]  If 7PM-7AM, please contact night-coverage [email or web address] Password [email or web address]  05/10/2017,  2:50 PM

## 2017-05-11 ENCOUNTER — Observation Stay (HOSPITAL_BASED_OUTPATIENT_CLINIC_OR_DEPARTMENT_OTHER): Payer: 59

## 2017-05-11 DIAGNOSIS — I10 Essential (primary) hypertension: Secondary | ICD-10-CM | POA: Diagnosis not present

## 2017-05-11 DIAGNOSIS — R079 Chest pain, unspecified: Secondary | ICD-10-CM | POA: Diagnosis not present

## 2017-05-11 DIAGNOSIS — I361 Nonrheumatic tricuspid (valve) insufficiency: Secondary | ICD-10-CM

## 2017-05-11 DIAGNOSIS — D72829 Elevated white blood cell count, unspecified: Secondary | ICD-10-CM

## 2017-05-11 DIAGNOSIS — E1122 Type 2 diabetes mellitus with diabetic chronic kidney disease: Secondary | ICD-10-CM | POA: Diagnosis not present

## 2017-05-11 LAB — ECHOCARDIOGRAM COMPLETE
AOVTI: 41.1 cm
AV Peak grad: 14 mmHg
AV VEL mean LVOT/AV: 0.76
AV area mean vel ind: 1 cm2/m2
AV vel: 2.28
AVAREAMEANV: 2.37 cm2
AVAREAVTI: 2.26 cm2
AVAREAVTIIND: 0.96 cm2/m2
AVG: 7 mmHg
AVLVOTPG: 8 mmHg
AVPKVEL: 190 cm/s
Ao pk vel: 0.72 m/s
CHL CUP AV PEAK INDEX: 0.95
CHL CUP AV VALUE AREA INDEX: 0.96
CHL CUP MV DEC (S): 356
DOP CAL AO MEAN VELOCITY: 116 cm/s
EERAT: 18.38
EWDT: 356 ms
FS: 31 % (ref 28–44)
HEIGHTINCHES: 71 in
IV/PV OW: 0.98
LA diam end sys: 45 mm
LA diam index: 1.9 cm/m2
LA vol index: 21.8 mL/m2
LASIZE: 45 mm
LAVOL: 51.6 mL
LAVOLA4C: 52.6 mL
LV E/e' medial: 18.38
LV E/e'average: 18.38
LV PW d: 11 mm — AB (ref 0.6–1.1)
LV sys vol index: 14 mL/m2
LV sys vol: 33 mL (ref 21–61)
LVDIAVOL: 82 mL (ref 62–150)
LVDIAVOLIN: 35 mL/m2
LVELAT: 7.18 cm/s
LVOT VTI: 29.9 cm
LVOT area: 3.14 cm2
LVOT peak VTI: 0.73 cm
LVOTD: 20 mm
LVOTPV: 137 cm/s
LVOTSV: 94 mL
MV pk A vel: 114 m/s
MV pk E vel: 132 m/s
MVPG: 7 mmHg
RV LATERAL S' VELOCITY: 12.4 cm/s
RV TAPSE: 25.3 mm
Simpson's disk: 60
Stroke v: 49 ml
TDI e' lateral: 7.18
TDI e' medial: 7.29
Valve area: 2.28 cm2
WEIGHTICAEL: 3846.4 [oz_av]

## 2017-05-11 LAB — CBC WITH DIFFERENTIAL/PLATELET
BASOS PCT: 0 %
Basophils Absolute: 0 10*3/uL (ref 0.0–0.1)
EOS PCT: 4 %
Eosinophils Absolute: 0.5 10*3/uL (ref 0.0–0.7)
HCT: 46.2 % (ref 39.0–52.0)
Hemoglobin: 15.7 g/dL (ref 13.0–17.0)
LYMPHS ABS: 4.4 10*3/uL — AB (ref 0.7–4.0)
Lymphocytes Relative: 38 %
MCH: 30.1 pg (ref 26.0–34.0)
MCHC: 34 g/dL (ref 30.0–36.0)
MCV: 88.7 fL (ref 78.0–100.0)
MONO ABS: 0.7 10*3/uL (ref 0.1–1.0)
Monocytes Relative: 6 %
NEUTROS ABS: 5.9 10*3/uL (ref 1.7–7.7)
Neutrophils Relative %: 52 %
PLATELETS: 201 10*3/uL (ref 150–400)
RBC: 5.21 MIL/uL (ref 4.22–5.81)
RDW: 12.6 % (ref 11.5–15.5)
WBC: 11.5 10*3/uL — ABNORMAL HIGH (ref 4.0–10.5)

## 2017-05-11 LAB — LIPID PANEL
CHOL/HDL RATIO: 8.4 ratio
CHOLESTEROL: 160 mg/dL (ref 0–200)
HDL: 19 mg/dL — AB (ref >40)
LDL Cholesterol: UNDETERMINED mg/dL (ref 0–99)
TRIGLYCERIDES: 1145 mg/dL — AB (ref <150)
VLDL: UNDETERMINED mg/dL (ref 0–40)

## 2017-05-11 LAB — GLUCOSE, CAPILLARY
GLUCOSE-CAPILLARY: 288 mg/dL — AB (ref 65–99)
GLUCOSE-CAPILLARY: 350 mg/dL — AB (ref 65–99)
GLUCOSE-CAPILLARY: 337 mg/dL — AB (ref 65–99)
Glucose-Capillary: 327 mg/dL — ABNORMAL HIGH (ref 65–99)

## 2017-05-11 LAB — BASIC METABOLIC PANEL
ANION GAP: 8 (ref 5–15)
BUN: 18 mg/dL (ref 6–20)
CHLORIDE: 99 mmol/L — AB (ref 101–111)
CO2: 26 mmol/L (ref 22–32)
Calcium: 8.5 mg/dL — ABNORMAL LOW (ref 8.9–10.3)
Creatinine, Ser: 0.85 mg/dL (ref 0.61–1.24)
GFR calc Af Amer: >60 mL/min (ref >60)
Glucose, Bld: 343 mg/dL — ABNORMAL HIGH (ref 65–99)
Potassium: 4.2 mmol/L (ref 3.5–5.1)
SODIUM: 133 mmol/L — AB (ref 135–145)

## 2017-05-11 LAB — PROTIME-INR
INR: 0.92
Prothrombin Time: 12.3 seconds (ref 11.4–15.2)

## 2017-05-11 LAB — TROPONIN I: Troponin I: <0.03 ng/mL (ref <0.03)

## 2017-05-11 LAB — HIV ANTIBODY (ROUTINE TESTING W REFLEX): HIV SCREEN 4TH GENERATION: NONREACTIVE

## 2017-05-11 MED ORDER — INSULIN DETEMIR 100 UNIT/ML ~~LOC~~ SOLN
60.0000 [IU] | Freq: Two times a day (BID) | SUBCUTANEOUS | Status: DC
  Administered 2017-05-11: 60 [IU] via SUBCUTANEOUS
  Filled 2017-05-11 (×3): qty 0.6

## 2017-05-11 MED ORDER — INSULIN DETEMIR 100 UNIT/ML ~~LOC~~ SOLN
64.0000 [IU] | Freq: Two times a day (BID) | SUBCUTANEOUS | Status: DC
  Administered 2017-05-11: 64 [IU] via SUBCUTANEOUS
  Filled 2017-05-11 (×2): qty 0.64

## 2017-05-11 MED ORDER — INSULIN ASPART 100 UNIT/ML ~~LOC~~ SOLN
4.0000 [IU] | Freq: Three times a day (TID) | SUBCUTANEOUS | Status: DC
  Administered 2017-05-11 (×3): 4 [IU] via SUBCUTANEOUS

## 2017-05-11 MED ORDER — ISOSORBIDE MONONITRATE ER 30 MG PO TB24
15.0000 mg | ORAL_TABLET | Freq: Every day | ORAL | Status: DC
  Administered 2017-05-11 – 2017-05-12 (×2): 15 mg via ORAL
  Filled 2017-05-11 (×2): qty 1

## 2017-05-11 MED ORDER — INSULIN ASPART 100 UNIT/ML ~~LOC~~ SOLN
8.0000 [IU] | Freq: Three times a day (TID) | SUBCUTANEOUS | Status: DC
  Administered 2017-05-12: 8 [IU] via SUBCUTANEOUS

## 2017-05-11 MED ORDER — FENOFIBRATE 160 MG PO TABS: 160.0000 mg | ORAL_TABLET | Freq: Every day | ORAL | Status: DC

## 2017-05-11 MED ORDER — INSULIN ASPART 100 UNIT/ML ~~LOC~~ SOLN: 10.0000 [IU] | Freq: Three times a day (TID) | SUBCUTANEOUS | Status: DC

## 2017-05-11 NOTE — Progress Notes (Addendum)
Inpatient Diabetes Program Recommendations  AACE/ADA: New Consensus Statement on Inpatient Glycemic Control (2015)  Target Ranges:  Prepandial:   less than 140 mg/dL      Peak postprandial:   less than 180 mg/dL (1-2 hours)      Critically ill patients:  140 - 180 mg/dL   Results for Lance [name], Lance [name] (MRN [id number]) as of 05/11/2017 10:21  Ref. Range 05/10/2017 16:53 05/10/2017 21:26 05/11/2017 08:01  Glucose-Capillary Latest Ref Range: 65 - 99 mg/dL 409230 (H) 811394 (H) 914288 (H)    Admit with: CP  History: DM  Home DM Meds: Levemir 50 units BID       Metformin 1000 mg BID  Current Insulin Orders: Levemir 60 units BID      Novolog Resistant Correction Scale/ SSI (0-20 units) TID AC      Novolog 4 units TID with meals    MD- Please consider the following in-hospital insulin adjustments:  Increase Novolog Meal Coverage to: Novolog 6 units TID with meals    MD- Spoke with pt by phone this AM (DM Coordinator not present on campus today).  Patient told me he was switched to Levemir insulin from Lantus insulin by his PCP b/[name] would no longer cover Lantus (about 4 months ago).  Has had poor glucose control since the switch.  Patient states he does not check his CBGs regularly at home.  Patient told me he has an appt with Dr. [name]) on 05/20/17.  Encouraged pt to check his CBGs at least TID before meals (and occasionally 2-hours after meals) over the next several days prior to going to see Dr. Fransico HimNida.  I asked pt if he had any questions about his DM care at home, nutrition at home, etc.  Patient stated he did not have any questions at this time.    Reviewed Last A1c on file of 11.1% back from November 2017.  Patient told me he knows that his A1c should be around 7%.     --Will follow patient during hospitalization--  Ambrose FinlandJeannine Johnston Rasheen Schewe RN, MSN, CDE Diabetes Coordinator Inpatient Glycemic Control Team Team Pager: [phone] (8a-5p)

## 2017-05-11 NOTE — Progress Notes (Signed)
*  PRELIMINARY RESULTS* Echocardiogram 2D Echocardiogram has been performed.  Stacey DrainWhite, Tvisha Schwoerer J 05/11/2017, 3:01 PM

## 2017-05-11 NOTE — Progress Notes (Addendum)
**Note De-identified Nannette Zill Obfuscation** EKG complete and placed in patient chart.  RN notified 

## 2017-05-11 NOTE — Progress Notes (Signed)
PROGRESS NOTE    Lance [name]  ZOX:[id number] DOB: December 02, 1975 DOA: 05/10/2017 PCP: [facility]   Brief Narrative:  Patient is a 41 year old gentleman history of poorly controlled type 2 diabetes, hypertension, hyperlipidemia, recent negative Myoview stress test November 2017, family history of coronary artery disease resented to the ED with a 2-3 day history of substernal intermittent chest. Patient admitted cardiac enzymes cycled which were negative 3. Cardiology consulted were recommended Myoview stress test done in the outpatient setting.   Assessment & Plan:   Principal Problem:   Chest pain Active Problems:   Essential hypertension, benign   DM type 2 (diabetes mellitus, type 2) (HCC)   Hyperlipidemia   Tobacco use disorder   Leukocytosis  #1 chest pain Questionable etiology. Patient with multiple cardiac risk factors. Patient would recent Myoview which was normal in December 2017. Patient currently chest pain-free. Cardiac enzymes which was cycled were negative 3. Fasting lipid panel with a triglyceride level of 1145 HDL of 19 LDL unable to be calculated. Patient has been seen in consultation by cardiology who feel patient needs a study of his anatomy due to multiple risk factors. Cardiology recommended outpatient cardiac CT over in the next 2 weeks. Patient has been started on low-dose Imdur and statin. Due to patient's bradycardia/low heart rate beta blocker is not recommended at this time. Cardiology following.  #2 poorly controlled diabetes mellitus II Hemoglobin A1c was 11.1 in November 2017. Patient states ever since he was changed from Lantus to Levemir his blood sugars have been difficult to control. CBGs have been ranging in the 300s. Will increase Levemir to 64 units twice daily. Patient was started on 4 units meal coverage NovoLog however will increase to 8 units meal coverage NovoLog.  #3 leukocytosis Chronic nature. Patient with no signs or symptoms of  infection. Outpatient follow-up.  #4 tobacco use disorder Tobacco cessation.  #5 hyperlipidemia Fasting lipid panel with HDL of 19, unable to calculate LDL, triglycerides of 1145. Continue statin and Zetia. Outpatient follow-up with PCP.   DVT prophylaxis: Lovenox Code Status: Full Family Communication: Updated patient. No family at bedside. Disposition Plan: Home once blood sugars are better controlled hopefully in the next 1-2 days.   Consultants:    Cardiology: Dr. Eden Emms 05/11/2017 Procedures:   2-D echo 05/11/2017  Chest x-ray 05/10/2017  Antimicrobials:   None    Subjective: Patient denies any chest pain. No shortness of breath. Patient feeling better.  Objective: Vitals:   05/10/17 1448 05/10/17 2128 05/11/17 0637 05/11/17 1725  BP: 123/69 114/64 125/76 (!) 112/54  Pulse: 70 (!) 57 (!) 59 60  Resp: 18 20 16 16   Temp: 98.4 F (36.9 C) 98.3 F (36.8 C) 97.8 F (36.6 C) 97.8 F (36.6 C)  TempSrc: Oral Oral Oral   SpO2: 100% 98% 99% 98%  Weight: 109 kg (240 lb 6.4 oz)     Height: 5\' 11"  (1.803 m)       Intake/Output Summary (Last 24 hours) at 05/11/17 1747 Last data filed at 05/11/17 0300  Gross per 24 hour  Intake          1151.67 ml  Output                0 ml  Net          1151.67 ml   Filed Weights   05/10/17 1233 05/10/17 1448  Weight: 108.9 kg (240 lb) 109 kg (240 lb 6.4 oz)    Examination:  General exam:  Appears calm and comfortable  Respiratory system: Clear to auscultation. Respiratory effort normal. Cardiovascular system: S1 & S2 heard, RRR. No JVD, murmurs, rubs, gallops or clicks. No pedal edema. Gastrointestinal system: Abdomen is nondistended, soft and nontender. No organomegaly or masses felt. Normal bowel sounds heard. Central nervous system: Alert and oriented. No focal neurological deficits. Extremities: Symmetric 5 x 5 power. Skin: No rashes, lesions or ulcers Psychiatry: Judgement and insight appear normal. Mood & affect  appropriate.     Data Reviewed: I have personally reviewed following labs and imaging studies  CBC:  Recent Labs Lab 05/10/17 1236 05/11/17 0415  WBC 14.7* 11.5*  NEUTROABS 9.0* 5.9  HGB 16.4 15.7  HCT 47.0 46.2  MCV 86.7 88.7  PLT 213 201   Basic Metabolic Panel:  Recent Labs Lab 05/10/17 1236 05/10/17 1516 05/11/17 0415  NA 134*  --  133*  K 4.2  --  4.2  CL 100*  --  99*  CO2 24  --  26  GLUCOSE 307*  --  343*  BUN 17  --  18  CREATININE 0.88  --  0.85  CALCIUM 9.8  --  8.5*  MG  --  1.9  --    GFR: Estimated Creatinine Clearance: 143.6 mL/min (by C-G formula based on SCr of 0.85 mg/dL). Liver Function Tests: No results for input(s): AST, ALT, ALKPHOS, BILITOT, PROT, ALBUMIN in the last 168 hours.  Recent Labs Lab 05/10/17 1402  LIPASE 27   No results for input(s): AMMONIA in the last 168 hours. Coagulation Profile:  Recent Labs Lab 05/11/17 0415  INR 0.92   Cardiac Enzymes:  Recent Labs Lab 05/10/17 1236 05/10/17 2018 05/11/17 0414 05/11/17 0954  TROPONINI <0.03 <0.03 <0.03 <0.03   BNP (last 3 results) No results for input(s): PROBNP in the last 8760 hours. HbA1C: No results for input(s): HGBA1C in the last 72 hours. CBG:  Recent Labs Lab 05/10/17 1653 05/10/17 2126 05/11/17 0801 05/11/17 1118 05/11/17 1630  GLUCAP 230* 394* 288* 350* 327*   Lipid Profile:  Recent Labs  05/11/17 0415  CHOL 160  HDL 19*  LDLCALC UNABLE TO CALCULATE IF TRIGLYCERIDE OVER 400 mg/dL  TRIG 1,610*  CHOLHDL 8.4   Thyroid Function Tests:  Recent Labs  05/10/17 1516  TSH 1.318   Anemia Panel: No results for input(s): VITAMINB12, FOLATE, FERRITIN, TIBC, IRON, RETICCTPCT in the last 72 hours. Sepsis Labs: No results for input(s): PROCALCITON, LATICACIDVEN in the last 168 hours.  No results found for this or any previous visit (from the past 240 hour(s)).       Radiology Studies: Dg Chest 2 View  Result Date: 05/10/2017 CLINICAL  DATA:  Chest pain and shortness of breath for 1 week. EXAM: CHEST  2 VIEW COMPARISON:  12/24/2016 FINDINGS: The heart size and mediastinal contours are within normal limits. Both lungs are clear. The visualized skeletal structures are unremarkable. IMPRESSION: No active cardiopulmonary disease. Electronically Signed   By: Myles Rosenthal M.D.   On: 05/10/2017 13:08        Scheduled Meds: . aspirin EC  81 mg Oral Daily  . atorvastatin  80 mg Oral q1800  . carvedilol  3.125 mg Oral BID WC  . enoxaparin (LOVENOX) injection  40 mg Subcutaneous Q24H  . ezetimibe  10 mg Oral Daily  . insulin aspart  0-20 Units Subcutaneous TID WC  . [START ON 05/12/2017] insulin aspart  10 Units Subcutaneous TID WC  . insulin detemir  64 Units Subcutaneous  BID  . isosorbide mononitrate  15 mg Oral Daily  . lisinopril  40 mg Oral Daily   Continuous Infusions:   LOS: 0 days    Time spent: 35 minutes    Janace Decker, MD [facility] Pager [phone]  If 7PM-7AM, please contact night-coverage [email or web address] Password [facility] [email or web address] 05/11/2017, 5:47 PM

## 2017-05-11 NOTE — Consult Note (Signed)
Cardiology Consultation:   Patient ID: Lance [name]; [id number]; Aug 15, 1976   Admit date: 05/10/2017 Date of Consult: 05/11/2017  Primary Care Provider: [facility] Primary Cardiologist: [place] Primary Electrophysiologist:  None   Patient Profile:   Lance [name] is a 41 y.o. male with a hx of DM who is being seen today for the evaluation of chest pain at the request of Lance [name].  History of Present Illness:   Lance [name] 41 y.o. poorly controlled DM. Indicates insurance company won't pay for medicine that works for him. Had a normal myovue 08/2016. Admitted with 2-3 day history of ches tpain. Pain can be exertional lasts 10 minutes or so Some associated dyspnea. On admission WBC elevated and BS over 300/ Since admission no pain r/o CXR NAD. History of PAF and ongoing tobacco abuse. No complaints this am wants to eat Says he's compliant with meds. Frustrated by lack of [employer] company  To pay for his DM med. Family history premature CAD father MI age 61 and mom CABG age 74.   Past Medical History:  Diagnosis Date  . Essential hypertension   . History of nuclear stress test 08/2016   low risk study  . Hyperlipemia   . Irregular heart beat   . Type 2 diabetes mellitus (HCC)     Past Surgical History:  Procedure Laterality Date  . CHOLECYSTECTOMY       Inpatient Medications: Scheduled Meds: . aspirin  324 mg Oral NOW   Or  . aspirin  300 mg Rectal NOW  . aspirin EC  81 mg Oral Daily  . atorvastatin  80 mg Oral q1800  . carvedilol  3.125 mg Oral BID WC  . enoxaparin (LOVENOX) injection  40 mg Subcutaneous Q24H  . ezetimibe  10 mg Oral Daily  . insulin aspart  0-20 Units Subcutaneous TID WC  . insulin aspart  4 Units Subcutaneous TID WC  . insulin detemir  60 Units Subcutaneous BID  . lisinopril  40 mg Oral Daily   Continuous Infusions: . sodium chloride 100 mL/hr at 05/11/17 0115   PRN Meds: acetaminophen, acetaminophen, ALPRAZolam,  nitroGLYCERIN, ondansetron (ZOFRAN) IV  Allergies:    Allergies  Allergen Reactions  . Bee Venom Swelling  . Penicillins Itching and Rash    Has patient had a PCN reaction causing immediate rash, facial/tongue/throat swelling, SOB or lightheadedness with hypotension: Yes Has patient had a PCN reaction causing severe rash involving mucus membranes or skin necrosis: No Has patient had a PCN reaction that required hospitalization: No Has patient had a PCN reaction occurring within the last 10 years: No If all of the above answers are "NO", then may proceed with Cephalosporin use.     Social History:   Social History   Social History  . Marital status: Single    Spouse name: Lance [name]  . Number of children: Lance [name]  . Years of education: Lance [name]   Occupational History  . Not on file.   Social History Main Topics  . Smoking status: Current Some Day Smoker    Packs/day: 0.75    Types: Cigarettes  . Smokeless tobacco: Never Used     Comment: 1 - 1 1/2 pack a day.  . Alcohol use No  . Drug use: No  . Sexual activity: Yes    Birth control/ protection: None   Other Topics Concern  . Not on file   Social History Narrative  . No narrative on file    Family History:  Family History  Problem Relation Age of Onset  . Heart disease Mother [id number]       CABG  . Diabetes Mother   . Gout Mother   . Heart attack Father 257     ROS:  Please see the history of present illness.  ROS  All other ROS reviewed and negative.     Physical Exam/Data:   Vitals:   05/10/17 1430 05/10/17 1448 05/10/17 2128 05/11/17 0637  BP: (!) 123/57 123/69 114/64 125/76  Pulse: 66 70 (!) 57 (!) 59  Resp: 16 18 20 16   Temp:  98.4 F (36.9 C) 98.3 F (36.8 C) 97.8 F (36.6 C)  TempSrc:  Oral Oral Oral  SpO2: 96% 100% 98% 99%  Weight:  240 lb 6.4 oz (109 kg)    Height:  5\' 11"  (1.803 m)      Intake/Output Summary (Last 24 hours) at 05/11/17 0803 Last data filed at 05/11/17 0300  Gross per 24 hour  Intake           1391.67 ml  Output                0 ml  Net          1391.67 ml   Filed Weights   05/10/17 1233 05/10/17 1448  Weight: 240 lb (108.9 kg) 240 lb 6.4 oz (109 kg)   Body mass index is 33.53 kg/m.  General:  Well nourished, well developed, in no acute distress  HEENT: normal Lymph: no adenopathy Neck: no JVD Endocrine:  No thryomegaly Vascular: No carotid bruits; FA pulses 2+ bilaterally without bruits  Cardiac:  normal S1, S2; RRR; no murmur   Lungs:  clear to auscultation bilaterally, no wheezing, rhonchi or rales  Abd: soft, nontender, no hepatomegaly  Ext: no edema Musculoskeletal:  No deformities, BUE and BLE strength normal and equal Skin: warm and dry  Neuro:  CNs 2-12 intact, no focal abnormalities noted Psych:  Normal affect   EKG:  The EKG was personally reviewed and demonstrates:  NSR normal no acute ST chagnes  Telemetry:  Telemetry was personally reviewed and demonstrates:   NSR no arrhythmia  Relevant CV Studies: 08/2016 normal myovue  Echo 09/01/17 EF 60-65% mild LVH  Laboratory Data:  Chemistry Recent Labs Lab 05/10/17 1236 05/11/17 0415  NA 134* 133*  K 4.2 4.2  CL 100* 99*  CO2 24 26  GLUCOSE 307* 343*  BUN 17 18  CREATININE 0.88 0.85  CALCIUM 9.8 8.5*  GFRNONAA >60 >60  GFRAA >60 >60  ANIONGAP 10 8    No results for input(s): PROT, ALBUMIN, AST, ALT, ALKPHOS, BILITOT in the last 168 hours. Hematology Recent Labs Lab 05/10/17 1236 05/11/17 0415  WBC 14.7* 11.5*  RBC 5.42 5.21  HGB 16.4 15.7  HCT 47.0 46.2  MCV 86.7 88.7  MCH 30.3 30.1  MCHC 34.9 34.0  RDW 12.5 12.6  PLT 213 201   Cardiac Enzymes Recent Labs Lab 05/10/17 1236 05/10/17 2018 05/11/17 0414  TROPONINI <0.03 <0.03 <0.03   No results for input(s): TROPIPOC in the last 168 hours.  BNPNo results for input(s): BNP, PROBNP in the last 168 hours.  DDimer  Recent Labs Lab 05/10/17 1402  DDIMER <0.27    Radiology/Studies:  Dg Chest 2 View  Result Date:  05/10/2017 CLINICAL DATA:  Chest pain and shortness of breath for 1 week. EXAM: CHEST  2 VIEW COMPARISON:  12/24/2016 FINDINGS: The heart size and mediastinal contours are within normal  limits. Both lungs are clear. The visualized skeletal structures are unremarkable. IMPRESSION: No active cardiopulmonary disease. Electronically Signed   By: Myles Rosenthal M.D.   On: 05/10/2017 13:08    Assessment and Plan:   1. Chest Pain: r/o fairly recent normal myovue 08/2016  Multiple CRF;s. Favor d/c after sugar beter controlled and try to arrange outpatient cardiac CT and calcium score Has had normal perfusion study but needs anatomic study given multiple risk factors. Ok to d/c home will send message to office and see if can get cardiac [facility] approved sometime in next 2 weeks D/c home with imdur 30 mg ASA and statin HR low no beta blocker 2. DM:  Discussed low carb diet.  Target hemoglobin A1c is 6.5 or less.  Continue current medications. Seems to be major issue A1c still pending 3. Smoking counseled on cessation and relationship to vascular disease and cancer CXR on admission ok 4. Cholesterol :  Continue statin    Signed, Charlton Haws, MD  05/11/2017 8:03 AM

## 2017-05-12 DIAGNOSIS — I10 Essential (primary) hypertension: Secondary | ICD-10-CM | POA: Diagnosis not present

## 2017-05-12 DIAGNOSIS — R079 Chest pain, unspecified: Secondary | ICD-10-CM | POA: Diagnosis not present

## 2017-05-12 DIAGNOSIS — E119 Type 2 diabetes mellitus without complications: Secondary | ICD-10-CM

## 2017-05-12 DIAGNOSIS — E1122 Type 2 diabetes mellitus with diabetic chronic kidney disease: Secondary | ICD-10-CM | POA: Diagnosis not present

## 2017-05-12 LAB — BASIC METABOLIC PANEL
Anion gap: 8 (ref 5–15)
BUN: 17 mg/dL (ref 6–20)
CALCIUM: 9.1 mg/dL (ref 8.9–10.3)
CO2: 27 mmol/L (ref 22–32)
CREATININE: 0.8 mg/dL (ref 0.61–1.24)
Chloride: 102 mmol/L (ref 101–111)
GFR calc non Af Amer: >60 mL/min (ref >60)
Glucose, Bld: 230 mg/dL — ABNORMAL HIGH (ref 65–99)
Potassium: 4 mmol/L (ref 3.5–5.1)
SODIUM: 137 mmol/L (ref 135–145)

## 2017-05-12 LAB — HEMOGLOBIN A1C
Hgb A1c MFr Bld: 14.7 % — ABNORMAL HIGH (ref 4.8–5.6)
Mean Plasma Glucose: 375 mg/dL

## 2017-05-12 LAB — URINE CULTURE: Culture: NO GROWTH

## 2017-05-12 LAB — GLUCOSE, CAPILLARY
GLUCOSE-CAPILLARY: 234 mg/dL — AB (ref 65–99)
Glucose-Capillary: 212 mg/dL — ABNORMAL HIGH (ref 65–99)

## 2017-05-12 MED ORDER — INSULIN PEN NEEDLE 31G X 5 MM MISC: 10.0000 [IU] | Freq: Three times a day (TID) | 0 refills | Status: DC

## 2017-05-12 MED ORDER — EZETIMIBE 10 MG PO TABS: 10.0000 mg | ORAL_TABLET | Freq: Every day | ORAL | 0 refills | Status: DC

## 2017-05-12 MED ORDER — ISOSORBIDE MONONITRATE ER 30 MG PO TB24: 15.0000 mg | ORAL_TABLET | Freq: Every day | ORAL | 0 refills | Status: DC

## 2017-05-12 MED ORDER — INSULIN ASPART 100 UNIT/ML ~~LOC~~ SOLN
10.0000 [IU] | Freq: Three times a day (TID) | SUBCUTANEOUS | Status: DC
  Administered 2017-05-12: 10 [IU] via SUBCUTANEOUS

## 2017-05-12 MED ORDER — NITROGLYCERIN 0.4 MG SL SUBL: 0.4000 mg | SUBLINGUAL_TABLET | SUBLINGUAL | 0 refills | Status: DC | PRN

## 2017-05-12 MED ORDER — INSULIN DETEMIR 100 UNIT/ML ~~LOC~~ SOLN
68.0000 [IU] | Freq: Two times a day (BID) | SUBCUTANEOUS | Status: DC
  Administered 2017-05-12: 68 [IU] via SUBCUTANEOUS
  Filled 2017-05-12 (×3): qty 0.68

## 2017-05-12 MED ORDER — INSULIN ASPART 100 UNIT/ML FLEXPEN: 10.0000 [IU] | PEN_INJECTOR | Freq: Three times a day (TID) | SUBCUTANEOUS | 0 refills | Status: DC

## 2017-05-12 MED ORDER — BLOOD GLUCOSE METER KIT: PACK | 0 refills | Status: DC

## 2017-05-12 MED ORDER — CARVEDILOL 3.125 MG PO TABS: 3.1250 mg | ORAL_TABLET | Freq: Two times a day (BID) | ORAL | 0 refills | Status: DC

## 2017-05-12 MED ORDER — INSULIN DETEMIR 100 UNIT/ML FLEXPEN: 68.0000 [IU] | PEN_INJECTOR | Freq: Two times a day (BID) | SUBCUTANEOUS | 0 refills | Status: DC

## 2017-05-12 NOTE — Progress Notes (Signed)
Progress Note  Patient Name: Lance [name] Date of Encounter: 05/12/2017  Primary Cardiologist: Diona BrownerMcDowell  Subjective   No chest pain or dyspnea Pizza box on his food tray  Inpatient Medications    Scheduled Meds: . aspirin EC  81 mg Oral Daily  . atorvastatin  80 mg Oral q1800  . carvedilol  3.125 mg Oral BID WC  . enoxaparin (LOVENOX) injection  40 mg Subcutaneous Q24H  . ezetimibe  10 mg Oral Daily  . insulin aspart  0-20 Units Subcutaneous TID WC  . insulin aspart  8 Units Subcutaneous TID WC  . insulin detemir  64 Units Subcutaneous BID  . isosorbide mononitrate  15 mg Oral Daily  . lisinopril  40 mg Oral Daily   Continuous Infusions:  PRN Meds: acetaminophen, ALPRAZolam, nitroGLYCERIN, ondansetron (ZOFRAN) IV   Vital Signs    Vitals:   05/11/17 0637 05/11/17 1725 05/11/17 1850 05/11/17 2036  BP: 125/76 (!) 112/54  (!) 108/43  Pulse: (!) 59 60  67  Resp: 16 16  16   Temp: 97.8 F (36.6 C) 97.8 F (36.6 C)    TempSrc: Oral     SpO2: 99% 98% 95% 100%  Weight:      Height:        Intake/Output Summary (Last 24 hours) at 05/12/17 0804 Last data filed at 05/11/17 1900  Gross per 24 hour  Intake              240 ml  Output                0 ml  Net              240 ml   Filed Weights   05/10/17 1233 05/10/17 1448  Weight: 240 lb (108.9 kg) 240 lb 6.4 oz (109 kg)    Telemetry    NSR  - Personally Reviewed  ECG    NSR no acute ST changes  - Personally Reviewed  Physical Exam  Overweight white male  GEN: No acute distress.   Neck: No JVD Cardiac: RRR, no murmurs, rubs, or gallops.  Respiratory: Clear to auscultation bilaterally. GI: Soft, nontender, non-distended  MS: No edema; No deformity. Neuro:  Nonfocal  Psych: Normal affect   Labs    Chemistry Recent Labs Lab 05/10/17 1236 05/11/17 0415  NA 134* 133*  K 4.2 4.2  CL 100* 99*  CO2 24 26  GLUCOSE 307* 343*  BUN 17 18  CREATININE 0.88 0.85  CALCIUM 9.8 8.5*  GFRNONAA >60 >60    GFRAA >60 >60  ANIONGAP 10 8     Hematology Recent Labs Lab 05/10/17 1236 05/11/17 0415  WBC 14.7* 11.5*  RBC 5.42 5.21  HGB 16.4 15.7  HCT 47.0 46.2  MCV 86.7 88.7  MCH 30.3 30.1  MCHC 34.9 34.0  RDW 12.5 12.6  PLT 213 201    Cardiac Enzymes Recent Labs Lab 05/10/17 1236 05/10/17 2018 05/11/17 0414 05/11/17 0954  TROPONINI <0.03 <0.03 <0.03 <0.03   No results for input(s): TROPIPOC in the last 168 hours.   BNPNo results for input(s): BNP, PROBNP in the last 168 hours.   DDimer  Recent Labs Lab 05/10/17 1402  DDIMER <0.27     Radiology    Dg Chest 2 View  Result Date: 05/10/2017 CLINICAL DATA:  Chest pain and shortness of breath for 1 week. EXAM: CHEST  2 VIEW COMPARISON:  12/24/2016 FINDINGS: The heart size and mediastinal contours are within normal  limits. Both lungs are clear. The visualized skeletal structures are unremarkable. IMPRESSION: No active cardiopulmonary disease. Electronically Signed   By: Myles Rosenthal M.D.   On: 05/10/2017 13:08    Cardiac Studies   Echo EF 55060% AV sclerosis   Patient Profile     41 y.o. male poorly controlled diabetic atypical chest pain normal myovue 08/2016  Assessment & Plan    1) Chest pain resolved r/o no ECG changes normal myovue 08/2016 will try to arrange outpatient F/u with SM and discussed f/u cardiac CT with patient OK to d/c home when DM sorted  Signed, Charlton Haws, MD  05/12/2017, 8:04 AM

## 2017-05-12 NOTE — Progress Notes (Signed)
**Note De-identified Lance [name] Obfuscation** EKG complete and placed in patient chart 

## 2017-05-12 NOTE — Progress Notes (Addendum)
Inpatient Diabetes Program Recommendations  AACE/ADA: New Consensus Statement on Inpatient Glycemic Control (2015)  Target Ranges:  Prepandial:   less than 140 mg/dL      Peak postprandial:   less than 180 mg/dL (1-2 hours)      Critically ill patients:  140 - 180 mg/dL    Review of Glycemic Control  Diabetes history: DM2 Outpatient Diabetes medications: Levemir 50 units BID, Metformin 1000 mg BID Current orders for Inpatient glycemic control: Levemir 68 units BID, Novolog 8 units TID with meals, Novolog 0-20 units TID with meals  Inpatient Diabetes Program Recommendations: Insulin - Basal: Noted Levemir increased to 68 units BID today. Correction (SSI): Glucose 337 mg/dl at bedtime on 1/6/108/6/18 but no correction given since none ordered. Please consider ordering Novolog correction for bedtime.  A1C: A1C 14.7% on 05/10/17 indicating an average glucose of 375 mg/dl over the past 2-3 months.   Addendum 05/12/17@15 :30-Informed patient of current A1C results and encouraged him to be sure to take insulin as prescribed at time of discharge and to keep appointment with Dr. Fransico HimNida. Asked that he take log of glucose readings with him to initial appointment with Dr. Fransico HimNida this month. Patient verbalized understanding of information discussed and states that he has no further questions at this time.  Thanks, Orlando PennerMarie Karl Knarr, RN, MSN, CDE Diabetes Coordinator Inpatient Diabetes Program [phone] (Team Pager from 8am to 5pm)

## 2017-05-12 NOTE — Progress Notes (Signed)
Discharge instructions and prescriptions given, verbalized understanding, out in stable condition ambulatory. 

## 2017-05-12 NOTE — Care Management Note (Signed)
Case Management Note  Patient Details  Name: Lance [name] MRN: [id number] Date of Birth: 07/15/1976  Subjective/Objective:                   Pt admitted for r/o CP but also with uncontrolled DM. Pt from home, emoployed, has PCP, transportation and insurance with drug coverage. Pt reports his DM was well controlled on Lantus and was then changed to levimir by PCP, he has since them been uncontrolled. He wishes to transition back to Lantus. Discharging MD has continued levimir and added Novolog TID. Pt plans to discuss transition to lantus with endocrinologist next week. Benefits check completed and pt will be responsible for $85 at retail and $212 mail order.   Action/Plan: Pt given results of benefits check. CM assissted pt with completing enrollment in savings program for novolog that will give him $25 co-pay for 2 years. Pt has no other CM needs prior to DC.   Expected Discharge Date:  05/12/17               Expected Discharge Plan:  Home/Self Care  [facility]  [facility]charge planning Services  CM Consult  Post Acute Care Choice:  NA Choice offered to:  NA  Status of Service:  Completed, signed off  Malcolm MetroChildress, Criselda Starke Demske, RN 05/12/2017, 3:06 PM

## 2017-05-12 NOTE — Discharge Summary (Signed)
Physician Discharge Summary  Lance [name] BHA:[id number] DOB: [birth year] DOA: 05/10/2017  PCP: [facility]  Admit date: 05/10/2017 Discharge date: 05/12/2017  Time spent: 65 minutes  Recommendations for Outpatient Follow-up:  1. Follow-up with [facility] in 2 weeks. On follow-up patient will need a basic metabolic profile done to follow-up on electrolytes and renal function. Patient's hyperlipidemia will need to be addressed on follow-up by patient's family and PCP. 2. Follow-up with Dr. Dorris Fetch, endocrinology as scheduled 05/20/2017 for better evaluation and management of diabetes. 3. Follow-up with Dr. Domenic Polite, cardiology 2 weeks. Patient is to be set up for a cardiac CT to be done over 40 tomorrow.   Discharge Diagnoses:  Principal Problem:   Chest pain Active Problems:   Essential hypertension, benign   DM type 2 (diabetes mellitus, type 2) ([place])   Hyperlipidemia   Tobacco use disorder   Leukocytosis   Discharge Condition: Stable and improved.  Diet recommendation: Carb modified diet  Filed Weights   05/10/17 1233 05/10/17 1448  Weight: 108.9 kg (240 lb) 109 kg (240 lb 6.4 oz)    History of present illness:  Lance [name] is a 41 y.o. male with medical history significant of poorly controlled type 2 diabetes, hypertension, hyperlipidemia, negative Myoview stress test November 2017, family history of coronary artery disease, history of atrial fibrillation, ongoing tobacco abuse presented to the ED with a 2-3 day history of substernal intermittent pressure on exertion with no radiation, no diaphoresis, no fever, no chills. He stated pain improved after approximately 10 minutes of rest. Patient did endorse some shortness of breath as well. Patient denied any hematuria, no syncope, no dizziness, no weakness, no recent surgeries, no recent trips. Patient stated chest pain usually lasts approximately 10 minutes and was improved with rest. Patient denied chest  pain being reproducible.  ED Course: Patient seen in the ED. Basic metabolic profile obtained had a sodium of 134 chloride of 100 glucose of 307 otherwise was within normal limits. EKG with normal sinus rhythm. CBC had a white count of 14.7 otherwise was within normal limits. Patient was given a full dose aspirin on presentation to the ED denies any further chest pain.  Hospital Course:  #1 chest pain Questionable etiology. Patient with multiple cardiac risk factors. Patient with recent Myoview which was normal in December 2017. Patient remained chest pain-free throughout the hospitalization.. Cardiac enzymes which was cycled were negative 3. Fasting lipid panel with a triglyceride level of 1145 HDL of 19 LDL unable to be calculated. Patient was maintained on a statin and Zetia added to his regimen.  Patient has been seen in consultation by cardiology who feel patient needs a study of his anatomy due to multiple risk factors. Cardiology recommended outpatient cardiac CT over in the next 2 weeks. Patient has been started on low-dose Imdur and statin. Due to patient's bradycardia/low heart rate beta blocker was initially held. 2-D echo done had a EF of 55-60%, no wall motion abnormalities, moderately dilated left atrium. Patient was subsequently started on Coreg and Imdur. Patient's lisinopril was continued. Patient is to follow-up with cardiology and outpatient setting. Patient will be set up per cardiology for cardiac CT. Patient will be discharged in stable and improved condition.   #2 poorly controlled diabetes mellitus II Hemoglobin A1c was 11.1 in November 2017. Repeat hemoglobin A1c done during this hospitalization was 14.7. Patient stated ever since he was changed from Lantus to Levemir his blood sugars have been difficult to control. CBGs  have been ranging in the 300s. Patient on home dose Levemir was adjusted and increased to 68 units twice daily. Patient was also placed on meal coverage insulin  of NovoLog which was uptitrated to 10 units with meals. Patient was maintained on a sliding scale insulin. Patient's metformin was discontinued during the hospitalization. Patient was seen by dietitian for diabetic education. Patient will follow-up with PCP. Patient states has an appointment with endocrinologist on 05/20/2017 which patient was encouraged to attend. Patient be discharged home on Levemir 68 units twice daily as well as NovoLog 10 units with meals 3 times daily.  #3 leukocytosis Chronic nature. Patient with no signs or symptoms of infection. Outpatient follow-up.  #4 tobacco use disorder Tobacco cessation.  #5 hyperlipidemia Fasting lipid panel with HDL of 19, unable to calculate LDL, triglycerides of 1145. Patient was maintained on home regimen statin as was added to his regimen. Outpatient follow-up with PCP.    Procedures:  2-D echo 05/11/2017  Chest x-ray 05/10/2017  Consultations: Cardiology: Dr. Johnsie Cancel 05/11/2017 Dietitian  Discharge Exam: Vitals:   05/11/17 1725 05/11/17 2036  BP: (!) 112/54 (!) 108/43  Pulse: 60 67  Resp: 16 16  Temp: 97.8 F (36.6 C)     General: NAD Cardiovascular: RRR Respiratory: CTAB  Discharge Instructions   Discharge Instructions    Diet Carb Modified    Complete by:  As directed    Increase activity slowly    Complete by:  As directed      Current Discharge Medication List    START taking these medications   Details  blood glucose meter kit and supplies Dispense based on patient and insurance preference. Use up to four times daily as directed. (FOR ICD-9 250.00, 250.01). Qty: 1 each, Refills: 0    carvedilol (COREG) 3.125 MG tablet Take 1 tablet (3.125 mg total) by mouth 2 (two) times daily with a meal. Qty: 60 tablet, Refills: 0    ezetimibe (ZETIA) 10 MG tablet Take 1 tablet (10 mg total) by mouth daily. Qty: 30 tablet, Refills: 0    insulin aspart (NOVOLOG FLEXPEN) 100 UNIT/ML FlexPen Inject 10 Units into  the skin 3 (three) times daily with meals. Qty: 15 mL, Refills: 0    Insulin Pen Needle 31G X 5 MM MISC 10 Units by Does not apply route 3 (three) times daily. Qty: 100 each, Refills: 0    isosorbide mononitrate (IMDUR) 30 MG 24 hr tablet Take 0.5 tablets (15 mg total) by mouth daily. Qty: 30 tablet, Refills: 0    nitroGLYCERIN (NITROSTAT) 0.4 MG SL tablet Place 1 tablet (0.4 mg total) under the tongue every 5 (five) minutes x 3 doses as needed for chest pain. Qty: 15 tablet, Refills: 0      CONTINUE these medications which have CHANGED   Details  Insulin Detemir (LEVEMIR FLEXPEN) 100 UNIT/ML Pen Inject 68 Units into the skin 2 (two) times daily. Qty: 15 mL, Refills: 0      CONTINUE these medications which have NOT CHANGED   Details  ALPRAZolam (XANAX) 1 MG tablet Take 1 mg by mouth daily as needed for anxiety.    aspirin EC 81 MG tablet Take 81 mg by mouth daily.    atorvastatin (LIPITOR) 80 MG tablet Take 1 tablet (80 mg total) by mouth daily. Qty: 30 tablet, Refills: 0    lisinopril (PRINIVIL,ZESTRIL) 40 MG tablet Take 40 mg by mouth daily.      STOP taking these medications     metFORMIN (  GLUCOPHAGE-XR) 500 MG 24 hr tablet        Allergies  Allergen Reactions  . Bee Venom Swelling  . Penicillins Itching and Rash    Has patient had a PCN reaction causing immediate rash, facial/tongue/throat swelling, SOB or lightheadedness with hypotension: Yes Has patient had a PCN reaction causing severe rash involving mucus membranes or skin necrosis: No Has patient had a PCN reaction that required hospitalization: No Has patient had a PCN reaction occurring within the last 10 years: No If all of the above answers are "NO", then may proceed with Cephalosporin use.    Follow-up Information    [facility]. Schedule an appointment as soon as possible for a visit in 2 week(s).   Contact information: [place] [address] [phone]        Satira Sark, MD. Schedule an appointment as soon as possible for a visit in 2 week(s).   Specialty:  Cardiology Contact information: [place] [address] [phone]        Cassandria Anger, MD Follow up on 05/20/2017.   Specialty:  Endocrinology Why:  f/u as scheduled. Contact information: [place] [address] [phone]            The results of significant diagnostics from this hospitalization (including imaging, microbiology, ancillary and laboratory) are listed below for reference.    Significant Diagnostic Studies: Dg Chest 2 View  Result Date: 05/10/2017 CLINICAL DATA:  Chest pain and shortness of breath for 1 week. EXAM: CHEST  2 VIEW COMPARISON:  12/24/2016 FINDINGS: The heart size and mediastinal contours are within normal limits. Both lungs are clear. The visualized skeletal structures are unremarkable. IMPRESSION: No active cardiopulmonary disease. Electronically Signed   By: Earle [name] M.D.   On: 05/10/2017 13:08    Microbiology: Recent Results (from the past 240 hour(s))  Culture, Urine     Status: None   Collection Time: 05/10/17  8:35 PM  Result Value Ref Range Status   Specimen Description URINE, CLEAN CATCH  Final   Special Requests NONE  Final   Culture   Final    NO GROWTH Performed at [facility], [address], [place], [place] [address]    Report Status 05/12/2017 FINAL  Final     Labs: Basic Metabolic Panel:  Recent Labs Lab 05/10/17 1236 05/10/17 1516 05/11/17 0415 05/12/17 0809  NA 134*  --  133* 137  K 4.2  --  4.2 4.0  CL 100*  --  99* 102  CO2 24  --  26 27  GLUCOSE 307*  --  343* 230*  BUN 17  --  18 17  CREATININE 0.88  --  0.85 0.80  CALCIUM 9.8  --  8.5* 9.1  MG  --  1.9  --   --    Liver Function Tests: No results for input(s): AST, ALT, ALKPHOS, BILITOT, PROT, ALBUMIN in the last 168 hours.  Recent Labs Lab 05/10/17 1402  LIPASE 27   No results for input(s):  AMMONIA in the last 168 hours. CBC:  Recent Labs Lab 05/10/17 1236 05/11/17 0415  WBC 14.7* 11.5*  NEUTROABS 9.0* 5.9  HGB 16.4 15.7  HCT 47.0 46.2  MCV 86.7 88.7  PLT 213 201   Cardiac Enzymes:  Recent Labs Lab 05/10/17 1236 05/10/17 2018 05/11/17 0414 05/11/17 0954  TROPONINI <0.03 <0.03 <0.03 <0.03   BNP: BNP (last 3 results) No  results for input(s): BNP in the last 8760 hours.  ProBNP (last 3 results) No results for input(s): PROBNP in the last 8760 hours.  CBG:  Recent Labs Lab 05/11/17 1118 05/11/17 1630 05/11/17 2034 05/12/17 0816 05/12/17 1128  GLUCAP 350* 327* 337* 212* 234*       Signed:  Alessia Gonsalez MD.  [facility] 05/12/2017, 1:37 PM

## 2017-05-12 NOTE — Plan of Care (Signed)
Problem: Food- and Nutrition-Related Knowledge Deficit (NB-1.1) Goal: Nutrition education Formal process to instruct or train a patient/client in a skill or to impart knowledge to help patients/clients voluntarily manage or modify food choices and eating behavior to maintain or improve health. Outcome: Progressing  RD consulted for nutrition education regarding diabetes/high TG.   Lab Results  Component Value Date   HGBA1C 11.1 (H) 08/31/2016  **New a1c pending  Lipid Panel     Component Value Date/Time   CHOL 160 05/11/2017 0415   TRIG 1,145 (H) 05/11/2017 0415   HDL 19 (L) 05/11/2017 0415   CHOLHDL 8.4 05/11/2017 0415   VLDL UNABLE TO CALCULATE IF TRIGLYCERIDE OVER 400 mg/dL 21/30/8657 8469   LDLCALC UNABLE TO CALCULATE IF TRIGLYCERIDE OVER 400 mg/dL 62/95/2841 3244    Went through dietary recall. 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   Breakfast: Fast food-biscuits typically. No juice. Today his family brought him an omelet Lunch: Typically will go to a "sit down restaurant" and try to have a "home style meal". He says he tries to order a vegetable, but this is "not always possible".  Dinner: Typically will eat at home. Grills a lot. Eats a lot of chicken. Typically will eat "something green". Mentions corn and potatoes.  Beverages: 50/50 sweet/unsweet tea, water, diet sodas, low sugar sports drinks (G2) Other: States he reads labels and looks for sugar and total carbs.   It is very difficult to get an accurate assessment of his diet due to the variability of his diet and the inability to see his food group portion sizes. However, it does sound like it could use at least some improvement. RD stated he was concerned about his biscuits. These are extremely carb dense items and exactly the type of item that can lead to uncontrolled BG and high TG alike. Recommended choosing eggs, like his wife brought him  this morning. Though,  Eating breakfast at home would be the most ideal, where he can better monitor carb intake and portion size.   RD also expressed concern over the potential of over consuming corn/potatoes, as these both sound to be a frequent item at his meals. He is given a list of starchy vegetables. His beverage choices also could be improved to consist of only kcal free beverages.  Though he reads labels, he does not count carbs. He doesn't aim for any specific number of carbs. RD gave him a goal of 80 carbs/meal.   RD recommended more label reading. He should choose items that are high in protein/fiber.  His wife mentions "whole grains". RD agrees that this is a very good option. He will have a very limited ability to label read with frequenting fast food restaurants.   RD provided "Carbohydrate Counting for People with Diabetes"  And "Diabetes Label reading tips" handouts from the Academy of Nutrition and Dietetics. . Provided list of carbohydrates and recommended serving sizes of common foods.  Discussed importance of controlled and consistent carbohydrate intake throughout the day. He says he does not skip meals.   RD explained that TG levels are highly influenced by simple sugar/carb intake and if he improves his DM control, he also should improve his TG.   Expect Fair compliance. Unfortunately, education sessions cut short when his two young children ran into the room with his breakfast. Also, his wife did not echo much support for diet changes and  instead jeered at him for talking to a dietitian; pt answered with scowl. Lack of support from spouse is a likely barrier to improved diet.   Body mass index is 33.53 kg/m. Pt meets criteria for obese based on current BMI.  Labs and medications reviewed. Patient would benefit from further education or even outpatient referral if he is open to it, though, did not discuss this with him. If additional nutrition issues arise, please re-consult  RD.  Christophe LouisNathan Franks RD, LDN, CNSC Clinical Nutrition Pager: [phone] 05/12/2017 10:59 AM

## 2017-05-15 ENCOUNTER — Other Ambulatory Visit: Payer: Self-pay

## 2017-05-20 ENCOUNTER — Ambulatory Visit (INDEPENDENT_AMBULATORY_CARE_PROVIDER_SITE_OTHER): Payer: 59 | Admitting: "Endocrinology

## 2017-05-20 ENCOUNTER — Encounter: Payer: Self-pay | Admitting: "Endocrinology

## 2017-05-20 VITALS — BP 123/78 | HR 66 | Ht 71.0 in | Wt 247.0 lb

## 2017-05-20 DIAGNOSIS — E782 Mixed hyperlipidemia: Secondary | ICD-10-CM

## 2017-05-20 DIAGNOSIS — E1122 Type 2 diabetes mellitus with diabetic chronic kidney disease: Secondary | ICD-10-CM

## 2017-05-20 DIAGNOSIS — Z6834 Body mass index (BMI) 34.0-34.9, adult: Secondary | ICD-10-CM

## 2017-05-20 DIAGNOSIS — Z794 Long term (current) use of insulin: Secondary | ICD-10-CM | POA: Diagnosis not present

## 2017-05-20 DIAGNOSIS — F17200 Nicotine dependence, unspecified, uncomplicated: Secondary | ICD-10-CM

## 2017-05-20 DIAGNOSIS — E6609 Other obesity due to excess calories: Secondary | ICD-10-CM

## 2017-05-20 DIAGNOSIS — I10 Essential (primary) hypertension: Secondary | ICD-10-CM | POA: Diagnosis not present

## 2017-05-20 DIAGNOSIS — E1165 Type 2 diabetes mellitus with hyperglycemia: Secondary | ICD-10-CM

## 2017-05-20 MED ORDER — INSULIN DETEMIR 100 UNIT/ML FLEXPEN: 60.0000 [IU] | PEN_INJECTOR | Freq: Every day | SUBCUTANEOUS | 2 refills | Status: DC

## 2017-05-20 MED ORDER — EMPAGLIFLOZIN 10 MG PO TABS: 10.0000 mg | ORAL_TABLET | Freq: Every day | ORAL | 2 refills | Status: DC

## 2017-05-20 MED ORDER — INSULIN ASPART 100 UNIT/ML FLEXPEN: 10.0000 [IU] | PEN_INJECTOR | Freq: Three times a day (TID) | SUBCUTANEOUS | 2 refills | Status: DC

## 2017-05-20 NOTE — Progress Notes (Signed)
Subjective:    Patient ID: Lance [name], male    DOB: Jul 15, 1976. Patient is being seen in consultation for management of diabetes requested by  [facility]  Past Medical History:  Diagnosis Date  . Essential hypertension   . History of nuclear stress test 08/2016   low risk study  . Hyperlipemia   . Irregular heart beat   . Type 2 diabetes mellitus ([place])    Past Surgical History:  Procedure Laterality Date  . CHOLECYSTECTOMY     Social History   Social History  . Marital status: Single    Spouse name: N/A  . Number of children: N/A  . Years of education: N/A   Social History Main Topics  . Smoking status: Current Some Day Smoker    Packs/day: 0.75    Types: Cigarettes  . Smokeless tobacco: Never Used     Comment: 1 - 1 1/2 pack a day.  . Alcohol use No  . Drug use: No  . Sexual activity: Yes    Birth control/ protection: None   Other Topics Concern  . None   Social History Narrative  . None   Outpatient Encounter Prescriptions as of 05/20/2017  Medication Sig  . atorvastatin (LIPITOR) 80 MG tablet Take 1 tablet (80 mg total) by mouth daily.  . carvedilol (COREG) 3.125 MG tablet Take 1 tablet (3.125 mg total) by mouth 2 (two) times daily with a meal.  . ezetimibe (ZETIA) 10 MG tablet Take 1 tablet (10 mg total) by mouth daily.  . Insulin Detemir (LEVEMIR FLEXPEN) 100 UNIT/ML Pen Inject 60 Units into the skin daily at 10 pm.  . insulin lispro (HUMALOG KWIKPEN) 100 UNIT/ML KiwkPen Inject 10-16 Units into the skin 3 (three) times daily before meals.  . isosorbide mononitrate (IMDUR) 30 MG 24 hr tablet Take 0.5 tablets (15 mg total) by mouth daily.  Marland Kitchen lisinopril (PRINIVIL,ZESTRIL) 40 MG tablet Take 40 mg by mouth daily.  . metFORMIN (GLUCOPHAGE) 500 MG tablet Take 500 mg by mouth 2 (two) times daily with a meal.  . nitroGLYCERIN (NITROSTAT) 0.4 MG SL tablet Place 1 tablet (0.4 mg total) under the tongue every 5 (five) minutes x 3 doses as needed for  chest pain.  . potassium chloride (K-DUR) 10 MEQ tablet Take 20 mEq by mouth daily.  . [DISCONTINUED] Insulin Detemir (LEVEMIR FLEXPEN) 100 UNIT/ML Pen Inject 68 Units into the skin 2 (two) times daily. (Patient taking differently: Inject 65 Units into the skin 2 (two) times daily. )  . ALPRAZolam (XANAX) 1 MG tablet Take 1 mg by mouth daily as needed for anxiety.  Marland Kitchen aspirin EC 81 MG tablet Take 81 mg by mouth daily.  . blood glucose meter kit and supplies Dispense based on patient and insurance preference. Use up to four times daily as directed. (FOR ICD-9 250.00, 250.01).  Marland Kitchen empagliflozin (JARDIANCE) 10 MG TABS tablet Take 10 mg by mouth daily.  . [DISCONTINUED] insulin aspart (NOVOLOG FLEXPEN) 100 UNIT/ML FlexPen Inject 10 Units into the skin 3 (three) times daily with meals.  . [DISCONTINUED] insulin aspart (NOVOLOG FLEXPEN) 100 UNIT/ML FlexPen Inject 10-16 Units into the skin 3 (three) times daily with meals.  . [DISCONTINUED] Insulin Pen Needle 31G X 5 MM MISC 10 Units by Does not apply route 3 (three) times daily.   No facility-administered encounter medications on file as of 05/20/2017.    ALLERGIES: Allergies  Allergen Reactions  . Bee Venom Swelling  . Penicillins Itching  and Rash    Has patient had a PCN reaction causing immediate rash, facial/tongue/throat swelling, SOB or lightheadedness with hypotension: Yes Has patient had a PCN reaction causing severe rash involving mucus membranes or skin necrosis: No Has patient had a PCN reaction that required hospitalization: No Has patient had a PCN reaction occurring within the last 10 years: No If all of the above answers are "NO", then may proceed with Cephalosporin use.    VACCINATION STATUS:  There is no immunization history on file for this patient.  Diabetes  He presents for his initial diabetic visit. He has type 2 diabetes mellitus. Onset time: He was diagnosed at approximate age of 73 years. His disease course has been  worsening (Was recently hospitalized due to hyperglycemia.). There are no hypoglycemic associated symptoms. Pertinent negatives for hypoglycemia include no confusion, headaches, pallor or seizures. Associated symptoms include blurred vision, polydipsia and polyuria. Pertinent negatives for diabetes include no chest pain, no fatigue, no polyphagia and no weakness. There are no hypoglycemic complications. Symptoms are worsening. There are no diabetic complications. Risk factors for coronary artery disease include diabetes mellitus, dyslipidemia, family history, hypertension, male sex, obesity, tobacco exposure and sedentary lifestyle. Current diabetic treatment includes intensive insulin program (He is on Levemir 68 units BID and Humalog 10 units TIDAC.). His weight is stable. He is following a generally unhealthy diet. When asked about meal planning, he reported none. He has not had a previous visit with a dietitian. He rarely participates in exercise. His home blood glucose trend is decreasing steadily. His overall blood glucose range is >200 mg/dl. An ACE inhibitor/angiotensin II receptor blocker is being taken. He does not see a podiatrist.Eye exam is not current.  Hyperlipidemia  This is a chronic problem. The current episode started more than 1 year ago. The problem is controlled. Exacerbating diseases include diabetes and obesity. Pertinent negatives include no chest pain, myalgias or shortness of breath. Current antihyperlipidemic treatment includes statins. Risk factors for coronary artery disease include diabetes mellitus, dyslipidemia, hypertension, male sex, obesity and a sedentary lifestyle.  Hypertension  This is a chronic problem. The current episode started more than 1 year ago. Associated symptoms include blurred vision. Pertinent negatives include no chest pain, headaches, neck pain, palpitations or shortness of breath. Risk factors for coronary artery disease include dyslipidemia, diabetes  mellitus, male gender and smoking/tobacco exposure. Past treatments include ACE inhibitors.       Review of Systems  Constitutional: Negative for chills, fatigue, fever and unexpected weight change.  HENT: Negative for dental problem, mouth sores and trouble swallowing.   Eyes: Positive for blurred vision. Negative for visual disturbance.  Respiratory: Negative for cough, choking, chest tightness, shortness of breath and wheezing.   Cardiovascular: Negative for chest pain, palpitations and leg swelling.  Gastrointestinal: Negative for abdominal distention, abdominal pain, constipation, diarrhea, nausea and vomiting.  Endocrine: Positive for polydipsia and polyuria. Negative for polyphagia.  Genitourinary: Negative for dysuria, flank pain, hematuria and urgency.  Musculoskeletal: Negative for back pain, gait problem, myalgias and neck pain.  Skin: Negative for pallor, rash and wound.  Neurological: Negative for seizures, syncope, weakness, numbness and headaches.  Psychiatric/Behavioral: Negative.  Negative for confusion and dysphoric mood.    Objective:    BP 123/78   Pulse 66   Ht 5' 11"  (1.803 m)   Wt 247 lb (112 kg)   BMI 34.45 kg/m   Wt Readings from Last 3 Encounters:  05/20/17 247 lb (112 kg)  05/10/17 240 lb  6.4 oz (109 kg)  12/29/16 254 lb (115.2 kg)    Physical Exam  Constitutional: He is oriented to person, place, and time. He appears well-developed and well-nourished. He is cooperative. No distress.  HENT:  Head: Normocephalic and atraumatic.  Eyes: EOM are normal.  Neck: Normal range of motion. Neck supple. No tracheal deviation present. No thyromegaly present.  Cardiovascular: Normal rate, S1 normal, S2 normal and normal heart sounds.  Exam reveals no gallop.   No murmur heard. Pulses:      Dorsalis pedis pulses are 1+ on the right side, and 1+ on the left side.       Posterior tibial pulses are 1+ on the right side, and 1+ on the left side.  Pulmonary/Chest:  Breath sounds normal. No respiratory distress. He has no wheezes.  Abdominal: Soft. Bowel sounds are normal. He exhibits no distension. There is no tenderness. There is no guarding and no CVA tenderness.  Musculoskeletal: He exhibits no edema.       Right shoulder: He exhibits no swelling and no deformity.  Neurological: He is alert and oriented to person, place, and time. He has normal strength and normal reflexes. No cranial nerve deficit or sensory deficit. Gait normal.  Skin: Skin is warm and dry. No rash noted. No cyanosis. Nails show no clubbing.  Psychiatric: He has a normal mood and affect. His speech is normal and behavior is normal. Judgment and thought content normal. Cognition and memory are normal.   CMP ( most recent) CMP     Component Value Date/Time   NA 137 05/12/2017 0809   K 4.0 05/12/2017 0809   CL 102 05/12/2017 0809   CO2 27 05/12/2017 0809   GLUCOSE 230 (H) 05/12/2017 0809   BUN 17 05/12/2017 0809   CREATININE 0.80 05/12/2017 0809   CALCIUM 9.1 05/12/2017 0809   PROT 7.0 12/24/2016 1210   ALBUMIN 4.1 12/24/2016 1210   AST 29 12/24/2016 1210   ALT 37 12/24/2016 1210   ALKPHOS 114 12/24/2016 1210   BILITOT 0.7 12/24/2016 1210   GFRNONAA >60 05/12/2017 0809   GFRAA >60 05/12/2017 0809     Diabetic Labs (most recent): Lab Results  Component Value Date   HGBA1C 14.7 (H) 05/10/2017   HGBA1C 11.8 11/07/2016   HGBA1C 11.1 (H) 08/31/2016     Lipid Panel ( most recent) Lipid Panel     Component Value Date/Time   CHOL 160 05/11/2017 0415   TRIG 1,145 (H) 05/11/2017 0415   HDL 19 (L) 05/11/2017 0415   CHOLHDL 8.4 05/11/2017 0415   VLDL UNABLE TO CALCULATE IF TRIGLYCERIDE OVER 400 mg/dL 05/11/2017 0415   LDLCALC UNABLE TO CALCULATE IF TRIGLYCERIDE OVER 400 mg/dL 05/11/2017 0415     Assessment & Plan:   1. Uncontrolled type 2 diabetes mellitus with chronic kidney disease, with long-term current use of insulin, unspecified CKD stage ([place])  - Patient has  currently uncontrolled symptomatic type 2 DM since  41 years of age,  with most recent A1c of 14.7 %. Recent labs reviewed.  -his diabetes is complicated by obesity/sedentary life, chronic heavy smoking and AKIL HOOS remains at a high risk for more acute and chronic complications which include CAD, CVA, CKD, retinopathy, and neuropathy. These are all discussed in detail with the patient.  - I have counseled him on diet management and weight loss, by adopting a carbohydrate restricted/protein rich diet.  - Suggestion is made for him to avoid simple carbohydrates  from his diet  including Cakes, Sweet Desserts, Ice Cream, Soda (diet and regular), Sweet Tea, Candies, Chips, Cookies, Store Bought Juices, Alcohol in Excess of  1-2 drinks a day, Artificial Sweeteners, and "Sugar-free" Products. This will help patient to have stable blood glucose profile and potentially avoid unintended weight gain.  - I encouraged him to switch to  unprocessed or minimally processed complex starch and increased protein intake (animal or plant source), fruits, and vegetables.  - he is advised to stick to a routine mealtimes to eat 3 meals  a day and avoid unnecessary snacks ( to snack only to correct hypoglycemia).   - he will be scheduled with Jearld Fenton, RDN, CDE for individualized DM education.  - I have approached him with the following individualized plan to manage diabetes and patient agrees:   - I  will proceed to readjust his therapy as follows: Lower his basal insulin Levemir to 60 units daily at bedtime , and continue prandial insulin  [place] log 10 units 3 times a day with meals  for pre-meal BG readings of 90-122m/dl, plus patient specific correction dose for unexpected hyperglycemia above 1550mdl, associated with strict monitoring of glucose 4 times a day-before meals and at bedtime. - Patient is warned not to take insulin without proper monitoring per orders. -Adjustment parameters are given for hypo  and hyperglycemia in writing. -Patient is encouraged to call clinic for blood glucose levels less than 70 or above 300 mg /dl. - he has a chance to introduce oral diabetes medications, he has used metformin in the past with no side effects. - I discussed and reinitiated metformin 500 mg by mouth twice a day. - I have also discussed and initiated low dose Jardiance 10 mg by mouth every morning. Side effects and precautions discussed with him. These oral medications would give him a chance to come off of the prandial insulin.  - he will be considered for incretin therapy as appropriate next visit. - Patient specific target  A1c;  LDL, HDL, Triglycerides, and  Waist Circumference were discussed in detail.  2) BP/HTN:controlled. I advised him to continue  current medications including ACEI/ARB. 3) Lipidsuncontrolled with hypertriglyceridemia of 380.   Patient is advised to continue statins. 4)  Weight/Diet: CDE Consult will be initiated , exercise, and detailed carbohydrates information provided.  5) Chronic Care/Health Maintenance:  -he  is on ACEI/ARB and Statin medications and  is encouraged to continue to follow up with Ophthalmology, Dentist,  Podiatrist at least yearly or according to recommendations, and advised to  quit smoking. I have recommended yearly flu vaccine and pneumonia vaccination at least every 5 years; moderate intensity exercise for up to 150 minutes weekly; and  sleep for at least 7 hours a day.  - Time spent with the patient: 1 hour, of which >50% was spent in obtaining information about his symptoms, reviewing his previous labs, evaluations, and treatments, counseling him about his  diabetes, hypertension, hyperlipidemia, smoking  (please see the discussed topics above), and developing a plan for long term treatment; he had a number of questions which I addressed.  - Patient to bring meter and  blood glucose logs during his next visit.  - I advised patient to maintain close  follow up with [facility] for primary care needs.  Follow up plan: - Return in about 3 months (around 08/20/2017) for follow up with pre-visit labs, meter, and logs.  GeGlade LloydMD Phone: [phone]: [phone] 05/20/2017, 9:31 AM

## 2017-05-20 NOTE — Patient Instructions (Signed)

## 2017-05-27 ENCOUNTER — Encounter: Payer: Self-pay | Admitting: Physician Assistant

## 2017-05-27 ENCOUNTER — Ambulatory Visit (INDEPENDENT_AMBULATORY_CARE_PROVIDER_SITE_OTHER): Payer: 59 | Admitting: Physician Assistant

## 2017-05-27 VITALS — BP 136/82 | HR 74 | Ht 71.0 in | Wt 247.0 lb

## 2017-05-27 DIAGNOSIS — R079 Chest pain, unspecified: Secondary | ICD-10-CM

## 2017-05-27 DIAGNOSIS — E1122 Type 2 diabetes mellitus with diabetic chronic kidney disease: Secondary | ICD-10-CM | POA: Diagnosis not present

## 2017-05-27 DIAGNOSIS — E1165 Type 2 diabetes mellitus with hyperglycemia: Secondary | ICD-10-CM | POA: Diagnosis not present

## 2017-05-27 DIAGNOSIS — I10 Essential (primary) hypertension: Secondary | ICD-10-CM

## 2017-05-27 DIAGNOSIS — E782 Mixed hyperlipidemia: Secondary | ICD-10-CM | POA: Diagnosis not present

## 2017-05-27 DIAGNOSIS — Z794 Long term (current) use of insulin: Secondary | ICD-10-CM

## 2017-05-27 NOTE — Patient Instructions (Addendum)
Medication Instructions:  Your physician recommends that you continue on your current medications as directed. Please refer to the Current Medication list given to you today.    Labwork: none  Testing/Procedures: Your physician has requested that you have cardiac CT. Cardiac computed tomography (CT) is a painless test that uses an x-ray machine to take clear, detailed pictures of your heart. For further information please visit [email or web address] Please follow instruction sheet as given.     Follow-Up: Your physician recommends that you schedule a follow-up appointment in:  To be determined based on test results    Any Other Special Instructions Will Be Listed Below (If Applicable).     If you need a refill on your cardiac medications before your next appointment, please call your pharmacy.

## 2017-05-27 NOTE — Progress Notes (Signed)
Cardiology Office Note    Date:  05/27/2017   ID:  Lance [name], DOB 1976/06/02, MRN [id number]  PCP:  [facility]  Cardiologist: Dr. Domenic Polite  No chief complaint on file.   History of Present Illness:  Lance [name] is a 41 y.o. male with history of hypertension, diabetes, HLD, tobacco abuse, obesity, family history of CAD. He had an admission in 08/2016 with chest pain and ruled out for an MI. Stress Myoview was negative for ischemia. Prior history of SVT converted with adenosine in the ER 2015.  I saw the patient 12/2016 after emergency room visit for chest pain. Had normal EKG and negative troponins didn't have chest pain.  Patient was hospitalized chest pain 05/12/17 had no EKG hospital by Dr. Johnsie Cancel who recommended follow-up cardiac CT in the future. His diabetes was poorly controlled. Triglyceride levels were 1145 HDL 19 and LDL unable to calculate. Zetia was added to his statin. Started on low-dose Imdur.  Comes in today for f/u. 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 He thinks most his trouble was because of his diabetes being out of control. He totally changed his diet and now his blood pressure has been low yesterday at the doctor's office. BP was 90/60 .He didn't take his lisinopril today because he ran out.   Past Medical History:  Diagnosis Date  . Essential hypertension   . History of nuclear stress test 08/2016   low risk study  . Hyperlipemia   . Irregular heart beat   . Type 2 diabetes mellitus (Lance [name])     Past Surgical History:  Procedure Laterality Date  . CHOLECYSTECTOMY      Current Medications: Current Meds  Medication Sig  . ALPRAZolam (XANAX) 1 MG tablet Take 1 mg by mouth daily as needed for anxiety.  Marland Kitchen aspirin EC 81 MG tablet Take 81 mg by mouth daily.  Marland Kitchen atorvastatin (LIPITOR) 80 MG tablet Take 1 tablet (80 mg total) by mouth daily.  . blood glucose meter kit and supplies Dispense based on  patient and insurance preference. Use up to four times daily as directed. (FOR ICD-9 250.00, 250.01).  . carvedilol (COREG) 3.125 MG tablet Take 1 tablet (3.125 mg total) by mouth 2 (two) times daily with a meal.  . empagliflozin (JARDIANCE) 10 MG TABS tablet Take 10 mg by mouth daily.  Marland Kitchen ezetimibe (ZETIA) 10 MG tablet Take 1 tablet (10 mg total) by mouth daily.  . Insulin Detemir (LEVEMIR FLEXPEN) 100 UNIT/ML Pen Inject 60 Units into the skin daily at 10 pm.  . insulin lispro (HUMALOG KWIKPEN) 100 UNIT/ML KiwkPen Inject 10-16 Units into the skin 3 (three) times daily before meals.  . isosorbide mononitrate (IMDUR) 30 MG 24 hr tablet Take 0.5 tablets (15 mg total) by mouth daily.  Marland Kitchen lisinopril (PRINIVIL,ZESTRIL) 40 MG tablet Take 40 mg by mouth daily.  . metFORMIN (GLUCOPHAGE) 500 MG tablet Take 500 mg by mouth 2 (two) times daily with a meal.  . nitroGLYCERIN (NITROSTAT) 0.4 MG SL tablet Place 1 tablet (0.4 mg total) under the tongue every 5 (five) minutes x 3 doses as needed for chest pain.  . potassium chloride (K-DUR) 10 MEQ tablet Take 20 mEq by mouth daily.     Allergies:   Bee venom and Penicillins   Social History   Social History  . Marital status: Single    Spouse name: N/A  . Number of children: N/A  .  Years of education: N/A   Social History Main Topics  . Smoking status: Current Some Day Smoker    Packs/day: 0.75    Types: Cigarettes  . Smokeless tobacco: Never Used     Comment: 1 - 1 1/2 pack a day.  . Alcohol use No  . Drug use: No  . Sexual activity: Yes    Birth control/ protection: None   Other Topics Concern  . None   Social History Narrative  . None     Family History:  The patient's family history includes Diabetes in his mother; Gout in his mother; Heart attack (age of onset: 65) in his father; Heart disease (age of onset: 55) in his mother.   ROS:   Please see the history of present illness.    Review of Systems  Constitution: Negative.  HENT:  Negative.   Cardiovascular: Negative.   Respiratory: Negative.   Endocrine: Negative.   Hematologic/Lymphatic: Negative.   Musculoskeletal: Negative.   Gastrointestinal: Negative.   Genitourinary: Negative.   Neurological: Negative.    All other systems reviewed and are negative.   PHYSICAL EXAM:   VS:  BP 136/82   Pulse 74   Ht _0  (1.803 m)   Wt 247 lb (112 kg)   SpO2 96%   BMI 34.45 kg/m   Physical Exam  GEN: Obese, in no acute distress  Neck: no JVD, carotid bruits, or masses Cardiac:RRR; no murmurs, rubs, or gallops  Respiratory:  clear to auscultation bilaterally, normal work of breathing GI: soft, nontender, nondistended, + BS Ext: without cyanosis, clubbing, or edema, Good distal pulses bilaterally Neuro:  Alert and Oriented x 3 Psych: euthymic mood, full affect  Wt Readings from Last 3 Encounters:  05/27/17 247 lb (112 kg)  05/20/17 247 lb (112 kg)  05/10/17 240 lb 6.4 oz (109 kg)      Studies/Labs Reviewed:   EKG:  EKG is not ordered today.    Recent Labs: 12/24/2016: ALT 37 05/10/2017: Magnesium 1.9; TSH 1.318 05/11/2017: Hemoglobin 15.7; Platelets 201 05/12/2017: BUN 17; Creatinine, Ser 0.80; Potassium 4.0; Sodium 137   Lipid Panel    Component Value Date/Time   CHOL 160 05/11/2017 0415   TRIG 1,145 (H) 05/11/2017 0415   HDL 19 (L) 05/11/2017 0415   CHOLHDL 8.4 05/11/2017 0415   VLDL UNABLE TO CALCULATE IF TRIGLYCERIDE OVER 400 mg/dL 05/11/2017 0415   LDLCALC UNABLE TO CALCULATE IF TRIGLYCERIDE OVER 400 mg/dL 05/11/2017 0415    Additional studies/ records that were reviewed today include:    Echocardiogram 09/01/2016 Left ventricle: The cavity size was normal. Wall thickness was   increased in a pattern of mild LVH. Systolic function was normal.   The estimated ejection fraction was in the range of 60% to 65%.   Wall motion was normal; there were no regional wall motion   abnormalities. Doppler parameters are consistent with abnormal   left  ventricular relaxation (grade 1 diastolic dysfunction). - Aortic valve: Trileaflet; mildly calcified leaflets. There was no   significant regurgitation. - Mitral valve: There was trivial regurgitation. - Right atrium: Central venous pressure (est): 3 mm Hg. - Tricuspid valve: There was physiologic regurgitation. - Pericardium, extracardiac: A prominent pericardial fat pad was   present.     09/01/2016  No diagnostic ST segment changes to indicate ischemia. No significant arrhythmias.  Blood pressure demonstrated a hypertensive response to exercise. Lexiscan also utilized given poor exercise tolerance.  No significant myocardial perfusion defect extending indicate scar or  ischemia.  This is a low risk study.  Nuclear stress EF: 67%.     ASSESSMENT:    No diagnosis found.   PLAN:  In order of problems listed above:  Chest pain resolved. Low risk Myoview 08/2016. Per Dr. Johnsie Cancel will order coronary CT with calcium score. Follow-up pending results coronary CT.  Hypertension better controlled and actually on the low side. Patient was told to decrease his lisinopril to 20 mg daily if his blood pressure is low. Suspect it's low because of the addition of isosorbide. Hopefully can stop this education if he has a normal coronary CT.  Diabetes mellitus type 2 better controlled with change in his diet managed by primary care  Mixed hyperlipidemia with triglycerides greater than 1100. Zetia added to his statin    Medication Adjustments/Labs and Tests Ordered: Current medicines are reviewed at length with the patient today.  Concerns regarding medicines are outlined above.  Medication changes, Labs and Tests ordered today are listed in the Patient Instructions below. Patient Instructions  Medication Instructions:  Your physician recommends that you continue on your current medications as directed. Please refer to the Current Medication list given to you  today.    Labwork: none  Testing/Procedures: Your physician has requested that you have cardiac CT. Cardiac computed tomography (CT) is a painless test that uses an x-ray machine to take clear, detailed pictures of your heart. For further information please visit [email or web address]. Please follow instruction sheet as given.     Follow-Up: Your physician recommends that you schedule a follow-up appointment in:  To be determined based on test    Any Other Special Instructions Will Be Listed Below (If Applicable).     If you need a refill on your cardiac medications before your next appointment, please call your pharmacy.      Signed, Ermalinda Barrios, PA-C  05/27/2017 2:25 PM    [facility] [address], [place], [place]  [address] Phone: [phone]; Fax: [phone]

## 2017-06-09 DIAGNOSIS — S63501A Unspecified sprain of right wrist, initial encounter: Secondary | ICD-10-CM | POA: Diagnosis not present

## 2017-06-09 DIAGNOSIS — W19XXXA Unspecified fall, initial encounter: Secondary | ICD-10-CM | POA: Diagnosis not present

## 2017-06-09 DIAGNOSIS — M25531 Pain in right wrist: Secondary | ICD-10-CM | POA: Diagnosis not present

## 2017-06-09 DIAGNOSIS — S53401A Unspecified sprain of right elbow, initial encounter: Secondary | ICD-10-CM | POA: Diagnosis not present

## 2017-06-09 DIAGNOSIS — M25521 Pain in right elbow: Secondary | ICD-10-CM | POA: Diagnosis not present

## 2017-06-09 DIAGNOSIS — S6991XA Unspecified injury of right wrist, hand and finger(s), initial encounter: Secondary | ICD-10-CM | POA: Diagnosis not present

## 2017-06-11 DIAGNOSIS — E782 Mixed hyperlipidemia: Secondary | ICD-10-CM | POA: Diagnosis not present

## 2017-06-11 DIAGNOSIS — E0865 Diabetes mellitus due to underlying condition with hyperglycemia: Secondary | ICD-10-CM | POA: Diagnosis not present

## 2017-06-11 DIAGNOSIS — I10 Essential (primary) hypertension: Secondary | ICD-10-CM | POA: Diagnosis not present

## 2017-07-01 ENCOUNTER — Encounter: Payer: 59 | Attending: "Endocrinology | Admitting: Nutrition

## 2017-07-01 VITALS — Ht 71.0 in | Wt 253.0 lb

## 2017-07-01 DIAGNOSIS — E1122 Type 2 diabetes mellitus with diabetic chronic kidney disease: Secondary | ICD-10-CM | POA: Diagnosis present

## 2017-07-01 DIAGNOSIS — Z794 Long term (current) use of insulin: Secondary | ICD-10-CM | POA: Diagnosis not present

## 2017-07-01 DIAGNOSIS — E1165 Type 2 diabetes mellitus with hyperglycemia: Secondary | ICD-10-CM | POA: Insufficient documentation

## 2017-07-01 DIAGNOSIS — E669 Obesity, unspecified: Secondary | ICD-10-CM

## 2017-07-01 DIAGNOSIS — N189 Chronic kidney disease, unspecified: Secondary | ICD-10-CM | POA: Insufficient documentation

## 2017-07-01 DIAGNOSIS — Z713 Dietary counseling and surveillance: Secondary | ICD-10-CM | POA: Insufficient documentation

## 2017-07-01 DIAGNOSIS — E118 Type 2 diabetes mellitus with unspecified complications: Secondary | ICD-10-CM

## 2017-07-01 NOTE — Patient Instructions (Signed)
Goals 1. Follow My Plate 2. Cut out diet soda and drink water 3. Make better choices when eating out 4. Take  Insulin as prescribed 5 Get A1C down to 7%

## 2017-07-01 NOTE — Progress Notes (Signed)
Diabetes Self-Management Education  Visit Type: First/Initial  Appt. Start Time: 0800 Appt. End Time: 930  07/01/2017  [name], identified by name and date of birth, is a 41 y.o. male with a diagnosis of Diabetes: Type 2.  He has had DM for 3 years. A1C 14.5%. He changed from Lantus to Levemir and BS got worse he notes. He lives with his girlfriend, soon to be married. He eats three meals per day. Eats a lot of fast food due to his job-works in [employer]. Smokes and drinks alcohol socially. Drinks a lot of Diet sodas but cutting back. FBS now are 80-120's.  Levemir 60 units a day and Humalog 10 units TID plus sliding scale.Metformin 500 mg BID, Jardiance daily. He is motivated to make changes with his diet and food  ASSESSMENT  Height  (1.803 m), weight 253 lb (114.8 kg). Body mass index is 35.29 kg/m.      Diabetes Self-Management Education - 07/01/17 0809      Visit Information   Visit Type First/Initial     Initial Visit   Diabetes Type Type 2   Are you currently following a meal plan? No   Are you taking your medications as prescribed? Yes   Date Diagnosed 2015     Health Coping   How would you rate your overall health? Good     Psychosocial Assessment   Patient Belief/Attitude about Diabetes Motivated to manage diabetes   Self-management support Family   Other persons present Patient;Spouse/SO   Patient Concerns Nutrition/Meal planning;Medication;Monitoring;Healthy Lifestyle;Weight Control   Special Needs None   Preferred Learning Style No preference indicated   Learning Readiness Change in progress   How often do you need to have someone help you when you read instructions, pamphlets, or other written materials from your doctor or pharmacy? 1 - Never   What is the last grade level you completed in school? 10     Pre-Education Assessment   Patient understands the diabetes disease and treatment process. Needs Review   Patient understands  incorporating nutritional management into lifestyle. Needs Review   Patient undertands incorporating physical activity into lifestyle. Needs Review   Patient understands using medications safely. Needs Review   Patient understands monitoring blood glucose, interpreting and using results Needs Review   Patient understands prevention, detection, and treatment of acute complications. Needs Review   Patient understands prevention, detection, and treatment of chronic complications. Needs Review   Patient understands how to develop strategies to address psychosocial issues. Needs Review   Patient understands how to develop strategies to promote health/change behavior. Needs Review     Complications   Last HgB A1C per patient/outside source 14.5 %   How often do you check your blood sugar? 3-4 times/day   Fasting Blood glucose range (mg/dL) 09-811   Postprandial Blood glucose range (mg/dL) 914-782   Number of hypoglycemic episodes per month 0   Number of hyperglycemic episodes per week 0   Have you had a dilated eye exam in the past 12 months? No   Have you had a dental exam in the past 12 months? No   Are you checking your feet? No     Dietary Intake   Breakfast 2 eggs, steak, coffee   Lunch [employer]- 2 tacos, Diet soda   Snack (afternoon) 1 slice chees   [employer]- 2 Dt Soda   Beverage(s) Dt Sodas, water-     Exercise   Exercise Type ADL's   How  many days per week to you exercise? --  He works in [employer] and is physically active all day.     Patient Education   Previous Diabetes Education No   Disease state  Definition of diabetes, type 1 and 2, and the diagnosis of diabetes;Explored patient's options for treatment of their diabetes   Nutrition management  Role of diet in the treatment of diabetes and the relationship between the three main macronutrients and blood glucose level;Food label reading, portion sizes and measuring food.;Carbohydrate counting;Meal timing in  regards to the patients' current diabetes medication.;Meal options for control of blood glucose level and chronic complications.;Information on hints to eating out and maintain blood glucose control.   Physical activity and exercise  Role of exercise on diabetes management, blood pressure control and cardiac health.   Medications Taught/reviewed insulin injection, site rotation, insulin storage and needle disposal.   Monitoring Purpose and frequency of SMBG.;Taught/discussed recording of test results and interpretation of SMBG.;Interpreting lab values - A1C, lipid, urine microalbumina.;Identified appropriate SMBG and/or A1C goals.   Acute complications Taught treatment of hypoglycemia - the 15 rule.;Discussed and identified patients' treatment of hyperglycemia.   Chronic complications Relationship between chronic complications and blood glucose control;Assessed and discussed foot care and prevention of foot problems;Lipid levels, blood glucose control and heart disease;Identified and discussed with patient  current chronic complications;Reviewed with patient heart disease, higher risk of, and prevention;Nephropathy, what it is, prevention of, the use of ACE, ARB's and early detection of through urine microalbumia.   Psychosocial adjustment Worked with patient to identify barriers to care and solutions;Role of stress on diabetes;Helped patient identify a support system for diabetes management;Brainstormed with patient on coping mechanisms for social situations, getting support from significant others, dealing with feelings about diabetes   Personal strategies to promote health Lifestyle issues that need to be addressed for better diabetes care;Review risk of smoking and offered smoking cessation     Individualized Goals (developed by patient)   Nutrition Follow meal plan discussed;General guidelines for healthy choices and portions discussed   Physical Activity Exercise 3-5 times per week;30 minutes per  day   Medications take my medication as prescribed   Monitoring  test my blood glucose as discussed   Reducing Risk stop smoking;examine blood glucose patterns;do foot checks daily     Post-Education Assessment   Patient understands the diabetes disease and treatment process. Needs Review   Patient understands incorporating nutritional management into lifestyle. Needs Review   Patient undertands incorporating physical activity into lifestyle. Needs Review   Patient understands using medications safely. Needs Review   Patient understands monitoring blood glucose, interpreting and using results Needs Review   Patient understands prevention, detection, and treatment of acute complications. Needs Review   Patient understands prevention, detection, and treatment of chronic complications. Needs Review   Patient understands how to develop strategies to address psychosocial issues. Needs Review   Patient understands how to develop strategies to promote health/change behavior. Needs Review     Outcomes   Expected Outcomes Demonstrated interest in learning. Expect positive outcomes   Future DMSE 2 months   Program Status Completed      Individualized Plan for Diabetes Self-Management Training:   Learning Objective:  Patient will have a greater understanding of diabetes self-management. Patient education plan is to attend individual and/or group sessions per assessed needs and concerns.   Plan:   Patient Instructions  Goals 1. Follow My Plate 2. Cut out diet soda and drink water 3. Make better  choices when eating out 4. Take  Insulin as prescribed 5 Get A1C down to 7%    Expected Outcomes:  Demonstrated interest in learning. Expect positive outcomes  Education material provided: Living Well with Diabetes, Food label handouts, A1C conversion sheet, Meal plan card, My Plate and Carbohydrate counting sheet  If problems or questions, patient to contact team via:  Phone and  Email  Future DSME appointment: 2 months

## 2017-07-03 ENCOUNTER — Encounter: Payer: Self-pay | Admitting: Cardiovascular Disease

## 2017-07-03 ENCOUNTER — Telehealth: Payer: Self-pay

## 2017-07-03 DIAGNOSIS — E782 Mixed hyperlipidemia: Secondary | ICD-10-CM

## 2017-07-03 DIAGNOSIS — I10 Essential (primary) hypertension: Secondary | ICD-10-CM

## 2017-07-03 DIAGNOSIS — Z01812 Encounter for preprocedural laboratory examination: Secondary | ICD-10-CM

## 2017-07-03 NOTE — Telephone Encounter (Signed)
Called patient to order BMET and have patient come in on Tuesday to have done. Instructed patient to hold his metformin 24 hour before his CT and 48 hours after his CT. Patient will stop taking Metformin on Thursday and restart on Monday. Patient verbalized understanding.

## 2017-07-07 ENCOUNTER — Other Ambulatory Visit: Payer: 59

## 2017-07-07 DIAGNOSIS — E782 Mixed hyperlipidemia: Secondary | ICD-10-CM | POA: Diagnosis not present

## 2017-07-07 DIAGNOSIS — Z01812 Encounter for preprocedural laboratory examination: Secondary | ICD-10-CM | POA: Diagnosis not present

## 2017-07-07 DIAGNOSIS — I10 Essential (primary) hypertension: Secondary | ICD-10-CM

## 2017-07-07 LAB — BASIC METABOLIC PANEL
BUN / CREAT RATIO: 15 (ref 9–20)
BUN: 15 mg/dL (ref 6–24)
CHLORIDE: 102 mmol/L (ref 96–106)
CO2: 24 mmol/L (ref 20–29)
Calcium: 9.7 mg/dL (ref 8.7–10.2)
Creatinine, Ser: 1.02 mg/dL (ref 0.76–1.27)
GFR calc non Af Amer: 91 mL/min/{1.73_m2} (ref >59)
GFR, EST AFRICAN AMERICAN: 105 mL/min/{1.73_m2} (ref >59)
Glucose: 110 mg/dL — ABNORMAL HIGH (ref 65–99)
POTASSIUM: 5.5 mmol/L — AB (ref 3.5–5.2)
SODIUM: 141 mmol/L (ref 134–144)

## 2017-07-08 ENCOUNTER — Telehealth: Payer: Self-pay

## 2017-07-08 DIAGNOSIS — E875 Hyperkalemia: Secondary | ICD-10-CM

## 2017-07-08 NOTE — Telephone Encounter (Signed)
Patient called back. Informed patient of lab results. Patient will stop potassium and have BMET check on 07/14/17. Patient verbalized understanding.

## 2017-07-08 NOTE — Telephone Encounter (Signed)
-----   Message from Wendall Stade, MD sent at 07/07/2017  6:32 PM EDT ----- Potassium is high not sure why he is on potassium have him stop it and we will check BMET after his CTA

## 2017-07-08 NOTE — Telephone Encounter (Signed)
Left message for patient to call back  

## 2017-07-10 ENCOUNTER — Telehealth: Payer: Self-pay | Admitting: *Deleted

## 2017-07-10 ENCOUNTER — Ambulatory Visit (HOSPITAL_COMMUNITY)
Admission: RE | Admit: 2017-07-10 | Discharge: 2017-07-10 | Disposition: A | Discharge status: Home / Self Care | Payer: 59 | Source: Ambulatory Visit | Attending: Cardiovascular Disease | Admitting: Cardiovascular Disease

## 2017-07-10 ENCOUNTER — Encounter: Payer: Self-pay | Admitting: *Deleted

## 2017-07-10 ENCOUNTER — Ambulatory Visit (HOSPITAL_COMMUNITY): Admission: RE | Admit: 2017-07-10 | Payer: 59 | Source: Ambulatory Visit

## 2017-07-10 DIAGNOSIS — I2510 Atherosclerotic heart disease of native coronary artery without angina pectoris: Secondary | ICD-10-CM | POA: Insufficient documentation

## 2017-07-10 DIAGNOSIS — I10 Essential (primary) hypertension: Secondary | ICD-10-CM

## 2017-07-10 DIAGNOSIS — Z8249 Family history of ischemic heart disease and other diseases of the circulatory system: Secondary | ICD-10-CM | POA: Insufficient documentation

## 2017-07-10 DIAGNOSIS — E119 Type 2 diabetes mellitus without complications: Secondary | ICD-10-CM | POA: Diagnosis not present

## 2017-07-10 DIAGNOSIS — R079 Chest pain, unspecified: Secondary | ICD-10-CM

## 2017-07-10 DIAGNOSIS — F17200 Nicotine dependence, unspecified, uncomplicated: Secondary | ICD-10-CM | POA: Insufficient documentation

## 2017-07-10 DIAGNOSIS — D508 Other iron deficiency anemias: Secondary | ICD-10-CM

## 2017-07-10 MED ORDER — NITROGLYCERIN 0.4 MG SL SUBL
SUBLINGUAL_TABLET | SUBLINGUAL | Status: AC
  Filled 2017-07-10: qty 2

## 2017-07-10 MED ORDER — METOPROLOL TARTRATE 5 MG/5ML IV SOLN
5.0000 mg | Freq: Once | INTRAVENOUS | Status: AC
  Administered 2017-07-10: 5 mg via INTRAVENOUS

## 2017-07-10 MED ORDER — METOPROLOL TARTRATE 5 MG/5ML IV SOLN
INTRAVENOUS | Status: AC
  Filled 2017-07-10: qty 5

## 2017-07-10 MED ORDER — NITROGLYCERIN 0.4 MG SL SUBL
0.8000 mg | SUBLINGUAL_TABLET | Freq: Once | SUBLINGUAL | Status: AC
  Administered 2017-07-10: 0.8 mg via SUBLINGUAL

## 2017-07-10 MED ORDER — IOPAMIDOL (ISOVUE-370) INJECTION 76%
INTRAVENOUS | Status: AC
  Administered 2017-07-10: 80 mL via INTRAVENOUS
  Filled 2017-07-10: qty 100

## 2017-07-10 NOTE — Telephone Encounter (Signed)
CT suggests high grade stenosis in RCA has not been seen since August Add on to my schedule in [place] Monday to arrange cath on Tuesday or [facility] Can contact patient and set cath up will need pre cath labs done Monday at [facility] patient with test results. No answer. Left message to call back.

## 2017-07-10 NOTE — Progress Notes (Signed)
Cardiology Office Note    Date:  07/14/2017   ID:  Lance [name], DOB 15-Dec-1975, MRN [id number]  PCP:  [facility]  Cardiologist: Dr. Domenic Polite  No chief complaint on file.   History of Present Illness:  Lance [name] is a 41 y.o. male with history of hypertension, diabetes, HLD, tobacco abuse, obesity, family history of CAD. He had an admission in 08/2016 with chest pain and ruled out for an MI. Stress Myoview was negative for ischemia. Prior history of SVT converted with adenosine in the ER 2015.  Patient was hospitalized chest pain 05/12/17  His diabetes was poorly controlled. Triglyceride levels were 1145 HDL 19 and LDL unable to calculate. Zetia was added to his statin. Started on low-dose Imdur.  F/U cardiac CT finally done 07/10/17 Poor quality study but calcium score 207 98 th percentile and suggested hemodynamically significant lesion In mid RCA  Disucssed cath procedure with him in am with Dr Burt Knack. He will take half his long acting insulin and no glucophage Or oral hypoglycemics Risks including stoke need for emergency surgery and bleeding discussed willing to proceed   Past Medical History:  Diagnosis Date  . Essential hypertension   . History of nuclear stress test 08/2016   low risk study  . Hyperlipemia   . Irregular heart beat   . Type 2 diabetes mellitus ([place])     Past Surgical History:  Procedure Laterality Date  . CHOLECYSTECTOMY      Current Medications: Current Meds  Medication Sig  . ALPRAZolam (XANAX) 1 MG tablet Take 1 mg by mouth daily as needed for anxiety.  Marland Kitchen aspirin EC 81 MG tablet Take 81 mg by mouth daily.  Marland Kitchen atorvastatin (LIPITOR) 80 MG tablet Take 1 tablet (80 mg total) by mouth daily.  . blood glucose meter kit and supplies Dispense based on patient and insurance preference. Use up to four times daily as directed. (FOR ICD-9 250.00, 250.01).  . carvedilol (COREG) 3.125 MG tablet Take 1 tablet (3.125 mg total) by mouth 2  (two) times daily with a meal.  . empagliflozin (JARDIANCE) 10 MG TABS tablet Take 10 mg by mouth daily.  Marland Kitchen ezetimibe (ZETIA) 10 MG tablet Take 1 tablet (10 mg total) by mouth daily.  . Insulin Detemir (LEVEMIR FLEXPEN) 100 UNIT/ML Pen Inject 60 Units into the skin daily at 10 pm.  . insulin lispro (HUMALOG KWIKPEN) 100 UNIT/ML KiwkPen Inject 10-16 Units into the skin 3 (three) times daily before meals.  . isosorbide mononitrate (IMDUR) 30 MG 24 hr tablet Take 0.5 tablets (15 mg total) by mouth daily.  Marland Kitchen lisinopril (PRINIVIL,ZESTRIL) 40 MG tablet Take 40 mg by mouth daily.  . metFORMIN (GLUCOPHAGE) 500 MG tablet Take 500 mg by mouth 2 (two) times daily with a meal.  . nitroGLYCERIN (NITROSTAT) 0.4 MG SL tablet Place 1 tablet (0.4 mg total) under the tongue every 5 (five) minutes x 3 doses as needed for chest pain.     Allergies:   Bee venom and Penicillins   Social History   Social History  . Marital status: Single    Spouse name: N/A  . Number of children: N/A  . Years of education: N/A   Social History Main Topics  . Smoking status: Current Some Day Smoker    Packs/day: 0.75    Types: Cigarettes  . Smokeless tobacco: Never Used     Comment: 1 - 1 1/2 pack a day.  . Alcohol use No  .  Drug use: No  . Sexual activity: Yes    Birth control/ protection: None   Other Topics Concern  . None   Social History Narrative  . None     Family History:  The patient's family history includes Diabetes in his mother; Gout in his mother; Heart attack (age of onset: 41) in his father; Heart disease (age of onset: 45) in his mother.   ROS:   Please see the history of present illness.    Review of Systems  Constitution: Negative.  HENT: Negative.   Cardiovascular: Negative.   Respiratory: Negative.   Endocrine: Negative.   Hematologic/Lymphatic: Negative.   Musculoskeletal: Negative.   Gastrointestinal: Negative.   Genitourinary: Negative.   Neurological: Negative.    All other  systems reviewed and are negative.   PHYSICAL EXAM:   VS:  BP 110/64 (BP Location: Right Arm)   Pulse 84   Ht _0  (1.803 m)   Wt 254 lb (115.2 kg)   SpO2 97%   BMI 35.43 kg/m   Physical Exam   Affect appropriate Healthy:  appears stated age HEENT: normal Neck supple with no adenopathy JVP normal no bruits no thyromegaly Lungs clear with no wheezing and good diaphragmatic motion Heart:  S1/S2 no murmur, no rub, gallop or click PMI normal Abdomen: benighn, BS positve, no tenderness, no AAA no bruit.  No HSM or HJR Distal pulses intact with no bruits No edema Neuro non-focal Skin warm and dry No muscular weakness   Wt Readings from Last 3 Encounters:  07/14/17 254 lb (115.2 kg)  07/01/17 253 lb (114.8 kg)  05/27/17 247 lb (112 kg)      Studies/Labs Reviewed:   EKG:  05/12/17 SR normal ECG   Recent Labs: 12/24/2016: ALT 37 05/10/2017: Magnesium 1.9; TSH 1.318 07/11/2017: BUN 15; Creat 0.97; Hemoglobin 15.7; Platelets 215; Potassium 4.6; Sodium 138   Lipid Panel    Component Value Date/Time   CHOL 160 05/11/2017 0415   TRIG 1,145 (H) 05/11/2017 0415   HDL 19 (L) 05/11/2017 0415   CHOLHDL 8.4 05/11/2017 0415   VLDL UNABLE TO CALCULATE IF TRIGLYCERIDE OVER 400 mg/dL 05/11/2017 0415   LDLCALC UNABLE TO CALCULATE IF TRIGLYCERIDE OVER 400 mg/dL 05/11/2017 0415    Additional studies/ records that were reviewed today include:    Echocardiogram 09/01/2016 Left ventricle: The cavity size was normal. Wall thickness was   increased in a pattern of mild LVH. Systolic function was normal.   The estimated ejection fraction was in the range of 60% to 65%.   Wall motion was normal; there were no regional wall motion   abnormalities. Doppler parameters are consistent with abnormal   left ventricular relaxation (grade 1 diastolic dysfunction). - Aortic valve: Trileaflet; mildly calcified leaflets. There was no   significant regurgitation. - Mitral valve: There was trivial  regurgitation. - Right atrium: Central venous pressure (est): 3 mm Hg. - Tricuspid valve: There was physiologic regurgitation. - Pericardium, extracardiac: A prominent pericardial fat pad was   present.     09/01/2016  No diagnostic ST segment changes to indicate ischemia. No significant arrhythmias.  Blood pressure demonstrated a hypertensive response to exercise. Lexiscan also utilized given poor exercise tolerance.  No significant myocardial perfusion defect extending indicate scar or ischemia.  This is a low risk study.  Nuclear stress EF: 67%.     ASSESSMENT:    Chest pain CAD   PLAN:   Chest Pain: multiple risk factors including poorly controlled DM positive  family history HTN HLD. High calcium Score for age and cardiac CT although poor quality suggests hemodynamically significant mid RCA stenosis Discussed options and plan for diagnostic heart cath this week. Lab called orders written. Risks including stroke MI, need for emergency surgery discussed willing to proceed. No contrast issues with CTA  DM:  Discussed low carb diet.  Target hemoglobin A1c is 6.5 or less.  Continue current medications.1/2 Dose LA insulin tonight and no oral hypoglycemics  HTN:  Well controlled.  Continue current medications and low sodium Dash type diet.    Cholesterol:  Continue statin and zetia.  Lab Results  Component Value Date   LDLCALC UNABLE TO CALCULATE IF TRIGLYCERIDE OVER 400 mg/dL 05/11/2017

## 2017-07-10 NOTE — Telephone Encounter (Signed)
-----   Message from Wendall Stade, MD sent at 07/10/2017  9:27 AM EDT ----- CT suggests high grade stenosis in RCA has not been seen since August Add on to my schedule in [place] Monday to arrange cath on Tuesday or [facility] Can contact patient and set cath up will need pre cath labs done Monday at [facility]

## 2017-07-12 LAB — BASIC METABOLIC PANEL
BUN: 15 mg/dL (ref 7–25)
CALCIUM: 9.3 mg/dL (ref 8.6–10.3)
CO2: 30 mmol/L (ref 20–32)
Chloride: 104 mmol/L (ref 98–110)
Creat: 0.97 mg/dL (ref 0.60–1.35)
Glucose, Bld: 115 mg/dL — ABNORMAL HIGH (ref 65–99)
POTASSIUM: 4.6 mmol/L (ref 3.5–5.3)
SODIUM: 138 mmol/L (ref 135–146)

## 2017-07-12 LAB — CBC WITH DIFFERENTIAL/PLATELET
BASOS PCT: 0.7 %
Basophils Absolute: 83 cells/uL (ref 0–200)
Eosinophils Absolute: 590 cells/uL — ABNORMAL HIGH (ref 15–500)
Eosinophils Relative: 5 %
HEMATOCRIT: 46.8 % (ref 38.5–50.0)
HEMOGLOBIN: 15.7 g/dL (ref 13.2–17.1)
LYMPHS ABS: 4165 {cells}/uL — AB (ref 850–3900)
MCH: 29.4 pg (ref 27.0–33.0)
MCHC: 33.5 g/dL (ref 32.0–36.0)
MCV: 87.6 fL (ref 80.0–100.0)
MPV: 11.8 fL (ref 7.5–12.5)
Monocytes Relative: 7.6 %
NEUTROS ABS: 6065 {cells}/uL (ref 1500–7800)
Neutrophils Relative %: 51.4 %
Platelets: 215 10*3/uL (ref 140–400)
RBC: 5.34 10*6/uL (ref 4.20–5.80)
RDW: 12.4 % (ref 11.0–15.0)
Total Lymphocyte: 35.3 %
WBC: 11.8 10*3/uL — AB (ref 3.8–10.8)
WBCMIX: 897 {cells}/uL (ref 200–950)

## 2017-07-12 LAB — PROTIME-INR
INR: 1
PROTHROMBIN TIME: 10.3 s (ref 9.0–11.5)

## 2017-07-14 ENCOUNTER — Encounter: Payer: Self-pay | Admitting: Cardiovascular Disease

## 2017-07-14 ENCOUNTER — Telehealth: Payer: Self-pay

## 2017-07-14 ENCOUNTER — Ambulatory Visit (INDEPENDENT_AMBULATORY_CARE_PROVIDER_SITE_OTHER): Payer: 59 | Admitting: Cardiovascular Disease

## 2017-07-14 ENCOUNTER — Other Ambulatory Visit: Payer: 59

## 2017-07-14 VITALS — BP 110/64 | HR 84 | Ht 71.0 in | Wt 254.0 lb

## 2017-07-14 DIAGNOSIS — I2510 Atherosclerotic heart disease of native coronary artery without angina pectoris: Secondary | ICD-10-CM | POA: Diagnosis not present

## 2017-07-14 NOTE — Patient Instructions (Signed)
Medication Instructions:  Your physician recommends that you continue on your current medications as directed. Please refer to the Current Medication list given to you today.   Labwork: NONE  Testing/Procedures: NONE  Follow-Up: Your physician recommends that you schedule a follow-up appointment in: 2-3 WEEKS POST CATH   Any Other Special Instructions Will Be Listed Below (If Applicable).     If you need a refill on your cardiac medications before your next appointment, please call your pharmacy.

## 2017-07-14 NOTE — Telephone Encounter (Signed)
Patient contacted pre-catheterization at [facility] scheduled for:  07/15/2017 @ 1300 Verified arrival time and place:  NT @ 1100 Confirmed AM meds to be taken pre-cath with sip of water: Take ASA Hold metformin starting 07/14/2017 Night before half dose levemir-30 units Hold Jardiance am of Confirmed patient has responsible person to drive home post procedure and observe patient for 24 hours:  yes Addl concerns:  none

## 2017-07-15 ENCOUNTER — Ambulatory Visit (HOSPITAL_COMMUNITY)
Admission: RE | Disposition: A | Discharge status: Home / Self Care | Payer: Self-pay | Source: Ambulatory Visit | Attending: Cardiovascular Disease

## 2017-07-15 ENCOUNTER — Encounter (HOSPITAL_COMMUNITY): Payer: Self-pay | Admitting: General Practice

## 2017-07-15 ENCOUNTER — Ambulatory Visit (HOSPITAL_COMMUNITY)
Admission: RE | Admit: 2017-07-15 | Discharge: 2017-07-16 | Disposition: A | Discharge status: Home / Self Care | Payer: 59 | Source: Ambulatory Visit | Attending: Cardiovascular Disease | Admitting: Cardiovascular Disease

## 2017-07-15 ENCOUNTER — Other Ambulatory Visit: Payer: Self-pay

## 2017-07-15 DIAGNOSIS — E782 Mixed hyperlipidemia: Secondary | ICD-10-CM | POA: Diagnosis not present

## 2017-07-15 DIAGNOSIS — I25118 Atherosclerotic heart disease of native coronary artery with other forms of angina pectoris: Secondary | ICD-10-CM | POA: Diagnosis not present

## 2017-07-15 DIAGNOSIS — E1165 Type 2 diabetes mellitus with hyperglycemia: Secondary | ICD-10-CM | POA: Diagnosis not present

## 2017-07-15 DIAGNOSIS — F17210 Nicotine dependence, cigarettes, uncomplicated: Secondary | ICD-10-CM | POA: Insufficient documentation

## 2017-07-15 DIAGNOSIS — I25119 Atherosclerotic heart disease of native coronary artery with unspecified angina pectoris: Secondary | ICD-10-CM | POA: Insufficient documentation

## 2017-07-15 DIAGNOSIS — Z794 Long term (current) use of insulin: Secondary | ICD-10-CM | POA: Insufficient documentation

## 2017-07-15 DIAGNOSIS — Z8249 Family history of ischemic heart disease and other diseases of the circulatory system: Secondary | ICD-10-CM | POA: Diagnosis not present

## 2017-07-15 DIAGNOSIS — Z880 Allergy status to penicillin: Secondary | ICD-10-CM | POA: Diagnosis not present

## 2017-07-15 DIAGNOSIS — I2510 Atherosclerotic heart disease of native coronary artery without angina pectoris: Secondary | ICD-10-CM | POA: Diagnosis present

## 2017-07-15 DIAGNOSIS — Z7902 Long term (current) use of antithrombotics/antiplatelets: Secondary | ICD-10-CM | POA: Insufficient documentation

## 2017-07-15 DIAGNOSIS — Z6835 Body mass index (BMI) 35.0-35.9, adult: Secondary | ICD-10-CM | POA: Diagnosis not present

## 2017-07-15 DIAGNOSIS — I10 Essential (primary) hypertension: Secondary | ICD-10-CM | POA: Diagnosis not present

## 2017-07-15 DIAGNOSIS — Z955 Presence of coronary angioplasty implant and graft: Secondary | ICD-10-CM

## 2017-07-15 DIAGNOSIS — IMO0002 Reserved for concepts with insufficient information to code with codable children: Secondary | ICD-10-CM | POA: Diagnosis present

## 2017-07-15 DIAGNOSIS — E669 Obesity, unspecified: Secondary | ICD-10-CM | POA: Diagnosis not present

## 2017-07-15 DIAGNOSIS — Z79899 Other long term (current) drug therapy: Secondary | ICD-10-CM | POA: Diagnosis not present

## 2017-07-15 DIAGNOSIS — Z7982 Long term (current) use of aspirin: Secondary | ICD-10-CM | POA: Insufficient documentation

## 2017-07-15 HISTORY — DX: Supraventricular tachycardia: I47.1

## 2017-07-15 HISTORY — DX: Atherosclerotic heart disease of native coronary artery without angina pectoris: I25.10

## 2017-07-15 HISTORY — DX: Personal history of urinary calculi: Z87.442

## 2017-07-15 HISTORY — DX: Gastro-esophageal reflux disease without esophagitis: K21.9

## 2017-07-15 HISTORY — PX: CORONARY ANGIOPLASTY WITH STENT PLACEMENT: SHX49

## 2017-07-15 HISTORY — PX: LEFT HEART CATH AND CORONARY ANGIOGRAPHY: CATH118249

## 2017-07-15 LAB — GLUCOSE, CAPILLARY
GLUCOSE-CAPILLARY: 173 mg/dL — AB (ref 65–99)
Glucose-Capillary: 98 mg/dL (ref 65–99)
Glucose-Capillary: 167 mg/dL — ABNORMAL HIGH (ref 65–99)

## 2017-07-15 LAB — POCT ACTIVATED CLOTTING TIME: ACTIVATED CLOTTING TIME: 588 s

## 2017-07-15 LAB — TROPONIN I: Troponin I: <0.03 ng/mL (ref <0.03)

## 2017-07-15 SURGERY — LEFT HEART CATH AND CORONARY ANGIOGRAPHY
Anesthesia: LOCAL

## 2017-07-15 MED ORDER — FENTANYL CITRATE (PF) 100 MCG/2ML IJ SOLN
INTRAMUSCULAR | Status: AC
  Filled 2017-07-15: qty 2

## 2017-07-15 MED ORDER — LABETALOL HCL 5 MG/ML IV SOLN: 10.0000 mg | INTRAVENOUS | Status: AC | PRN

## 2017-07-15 MED ORDER — VERAPAMIL HCL 2.5 MG/ML IV SOLN
INTRAVENOUS | Status: DC | PRN
  Administered 2017-07-15: 13:00:00 via INTRA_ARTERIAL

## 2017-07-15 MED ORDER — SODIUM CHLORIDE 0.9% FLUSH: 3.0000 mL | Freq: Two times a day (BID) | INTRAVENOUS | Status: DC

## 2017-07-15 MED ORDER — HEPARIN (PORCINE) IN NACL 2-0.9 UNIT/ML-% IJ SOLN
INTRAMUSCULAR | Status: AC
  Filled 2017-07-15: qty 1000

## 2017-07-15 MED ORDER — CLOPIDOGREL BISULFATE 300 MG PO TABS
ORAL_TABLET | ORAL | Status: AC
  Filled 2017-07-15: qty 1

## 2017-07-15 MED ORDER — LIDOCAINE HCL (PF) 1 % IJ SOLN
INTRAMUSCULAR | Status: DC | PRN
  Administered 2017-07-15: 2 mL via INTRADERMAL

## 2017-07-15 MED ORDER — SODIUM CHLORIDE 0.9 % IV SOLN: 250.0000 mL | INTRAVENOUS | Status: DC | PRN

## 2017-07-15 MED ORDER — INSULIN DETEMIR 100 UNIT/ML ~~LOC~~ SOLN
60.0000 [IU] | Freq: Every day | SUBCUTANEOUS | Status: DC
  Administered 2017-07-15: 60 [IU] via SUBCUTANEOUS
  Filled 2017-07-15 (×2): qty 0.6

## 2017-07-15 MED ORDER — MIDAZOLAM HCL 2 MG/2ML IJ SOLN
INTRAMUSCULAR | Status: AC
  Filled 2017-07-15: qty 2

## 2017-07-15 MED ORDER — SODIUM CHLORIDE 0.9% FLUSH: 3.0000 mL | INTRAVENOUS | Status: DC | PRN

## 2017-07-15 MED ORDER — NITROGLYCERIN 0.4 MG SL SUBL: 0.4000 mg | SUBLINGUAL_TABLET | SUBLINGUAL | Status: DC | PRN

## 2017-07-15 MED ORDER — SODIUM CHLORIDE 0.9 % IV SOLN: INTRAVENOUS | Status: AC

## 2017-07-15 MED ORDER — HEPARIN (PORCINE) IN NACL 2-0.9 UNIT/ML-% IJ SOLN
INTRAMUSCULAR | Status: AC | PRN
  Administered 2017-07-15: 1000 mL via INTRA_ARTERIAL

## 2017-07-15 MED ORDER — BIVALIRUDIN TRIFLUOROACETATE 250 MG IV SOLR
INTRAVENOUS | Status: AC
  Filled 2017-07-15: qty 250

## 2017-07-15 MED ORDER — IOPAMIDOL (ISOVUE-370) INJECTION 76%
INTRAVENOUS | Status: AC
  Filled 2017-07-15: qty 100

## 2017-07-15 MED ORDER — CLOPIDOGREL BISULFATE 75 MG PO TABS
75.0000 mg | ORAL_TABLET | Freq: Every day | ORAL | Status: DC
  Administered 2017-07-16: 75 mg via ORAL
  Filled 2017-07-15: qty 1

## 2017-07-15 MED ORDER — ADENOSINE (DIAGNOSTIC) 140MCG/KG/MIN
INTRAVENOUS | Status: DC | PRN
  Administered 2017-07-15: 140 ug/kg/min via INTRAVENOUS

## 2017-07-15 MED ORDER — HEPARIN SODIUM (PORCINE) 1000 UNIT/ML IJ SOLN
INTRAMUSCULAR | Status: DC | PRN
  Administered 2017-07-15: 6000 [IU] via INTRAVENOUS

## 2017-07-15 MED ORDER — MIDAZOLAM HCL 2 MG/2ML IJ SOLN
INTRAMUSCULAR | Status: DC | PRN
  Administered 2017-07-15 (×3): 2 mg via INTRAVENOUS

## 2017-07-15 MED ORDER — BIVALIRUDIN BOLUS VIA INFUSION - CUPID
INTRAVENOUS | Status: DC | PRN
  Administered 2017-07-15: 86.4 mg via INTRAVENOUS

## 2017-07-15 MED ORDER — CANAGLIFLOZIN 100 MG PO TABS
100.0000 mg | ORAL_TABLET | Freq: Every day | ORAL | Status: DC
  Filled 2017-07-15: qty 1

## 2017-07-15 MED ORDER — ASPIRIN 81 MG PO CHEW: 81.0000 mg | CHEWABLE_TABLET | ORAL | Status: DC

## 2017-07-15 MED ORDER — ALPRAZOLAM 0.5 MG PO TABS: 1.0000 mg | ORAL_TABLET | Freq: Every day | ORAL | Status: DC | PRN

## 2017-07-15 MED ORDER — FENTANYL CITRATE (PF) 100 MCG/2ML IJ SOLN
INTRAMUSCULAR | Status: DC | PRN
  Administered 2017-07-15 (×2): 25 ug via INTRAVENOUS

## 2017-07-15 MED ORDER — SODIUM CHLORIDE 0.9 % IV SOLN
INTRAVENOUS | Status: DC | PRN
  Administered 2017-07-15 (×2): 1.75 mg/kg/h via INTRAVENOUS

## 2017-07-15 MED ORDER — IOPAMIDOL (ISOVUE-370) INJECTION 76%
INTRAVENOUS | Status: DC | PRN
  Administered 2017-07-15: 180 mL via INTRA_ARTERIAL

## 2017-07-15 MED ORDER — VERAPAMIL HCL 2.5 MG/ML IV SOLN
INTRAVENOUS | Status: AC
  Filled 2017-07-15: qty 2

## 2017-07-15 MED ORDER — LIDOCAINE HCL 2 % IJ SOLN
INTRAMUSCULAR | Status: AC
  Filled 2017-07-15: qty 10

## 2017-07-15 MED ORDER — NITROGLYCERIN 1 MG/10 ML FOR IR/CATH LAB
INTRA_ARTERIAL | Status: DC | PRN
  Administered 2017-07-15 (×3): 150 ug via INTRACORONARY

## 2017-07-15 MED ORDER — ADENOSINE 12 MG/4ML IV SOLN
INTRAVENOUS | Status: AC
  Filled 2017-07-15: qty 16

## 2017-07-15 MED ORDER — ONDANSETRON HCL 4 MG/2ML IJ SOLN: 4.0000 mg | Freq: Four times a day (QID) | INTRAMUSCULAR | Status: DC | PRN

## 2017-07-15 MED ORDER — HEPARIN SODIUM (PORCINE) 1000 UNIT/ML IJ SOLN
INTRAMUSCULAR | Status: AC
  Filled 2017-07-15: qty 1

## 2017-07-15 MED ORDER — ISOSORBIDE MONONITRATE ER 30 MG PO TB24
15.0000 mg | ORAL_TABLET | Freq: Every day | ORAL | Status: DC
  Administered 2017-07-15 – 2017-07-16 (×2): 15 mg via ORAL
  Filled 2017-07-15 (×2): qty 1

## 2017-07-15 MED ORDER — HYDRALAZINE HCL 20 MG/ML IJ SOLN: 5.0000 mg | INTRAMUSCULAR | Status: AC | PRN

## 2017-07-15 MED ORDER — SODIUM CHLORIDE 0.9 % WEIGHT BASED INFUSION: 1.0000 mL/kg/h | INTRAVENOUS | Status: DC

## 2017-07-15 MED ORDER — NITROGLYCERIN 1 MG/10 ML FOR IR/CATH LAB
INTRA_ARTERIAL | Status: AC
  Filled 2017-07-15: qty 10

## 2017-07-15 MED ORDER — LISINOPRIL 20 MG PO TABS
40.0000 mg | ORAL_TABLET | Freq: Every day | ORAL | Status: DC
  Administered 2017-07-15 – 2017-07-16 (×2): 40 mg via ORAL
  Filled 2017-07-15 (×2): qty 2

## 2017-07-15 MED ORDER — INSULIN ASPART 100 UNIT/ML ~~LOC~~ SOLN
0.0000 [IU] | Freq: Three times a day (TID) | SUBCUTANEOUS | Status: DC
  Administered 2017-07-15: 3 [IU] via SUBCUTANEOUS
  Administered 2017-07-16: 2 [IU] via SUBCUTANEOUS
  Filled 2017-07-15: qty 0.15

## 2017-07-15 MED ORDER — EZETIMIBE 10 MG PO TABS
10.0000 mg | ORAL_TABLET | Freq: Every day | ORAL | Status: DC
  Administered 2017-07-15 – 2017-07-16 (×2): 10 mg via ORAL
  Filled 2017-07-15 (×2): qty 1

## 2017-07-15 MED ORDER — ACETAMINOPHEN 325 MG PO TABS: 650.0000 mg | ORAL_TABLET | ORAL | Status: DC | PRN

## 2017-07-15 MED ORDER — CLOPIDOGREL BISULFATE 300 MG PO TABS
ORAL_TABLET | ORAL | Status: DC | PRN
  Administered 2017-07-15: 600 mg via ORAL

## 2017-07-15 MED ORDER — CARVEDILOL 3.125 MG PO TABS
3.1250 mg | ORAL_TABLET | Freq: Two times a day (BID) | ORAL | Status: DC
  Administered 2017-07-15 – 2017-07-16 (×2): 3.125 mg via ORAL
  Filled 2017-07-15 (×2): qty 1

## 2017-07-15 MED ORDER — ASPIRIN EC 81 MG PO TBEC
81.0000 mg | DELAYED_RELEASE_TABLET | Freq: Every day | ORAL | Status: DC
  Administered 2017-07-16: 81 mg via ORAL
  Filled 2017-07-15: qty 1

## 2017-07-15 MED ORDER — ATORVASTATIN CALCIUM 80 MG PO TABS
80.0000 mg | ORAL_TABLET | Freq: Every day | ORAL | Status: DC
  Administered 2017-07-15: 80 mg via ORAL
  Filled 2017-07-15 (×3): qty 1

## 2017-07-15 MED ORDER — SODIUM CHLORIDE 0.9 % WEIGHT BASED INFUSION
3.0000 mL/kg/h | INTRAVENOUS | Status: DC
  Administered 2017-07-15: 3 mL/kg/h via INTRAVENOUS

## 2017-07-15 SURGICAL SUPPLY — 28 items
BALLN EMERGE MR 2.0X15 (BALLOONS) ×2
BALLN EMERGE MR 2.5X15 (BALLOONS) ×2
BALLN ~~LOC~~ EMERGE MR 2.75X20 (BALLOONS) ×2
BALLN ~~LOC~~ EMERGE MR 3.5X20 (BALLOONS) ×2
BALLOON EMERGE MR 2.0X15 (BALLOONS) IMPLANT
BALLOON EMERGE MR 2.5X15 (BALLOONS) IMPLANT
BALLOON ~~LOC~~ EMERGE MR 2.75X20 (BALLOONS) IMPLANT
BALLOON ~~LOC~~ EMERGE MR 3.5X20 (BALLOONS) IMPLANT
CATH INFINITI 5 FR JL3.5 (CATHETERS) ×1 IMPLANT
CATH INFINITI 5FR ANG PIGTAIL (CATHETERS) ×1 IMPLANT
CATH INFINITI JR4 5F (CATHETERS) ×1 IMPLANT
CATH LAUNCHER 6FR EBU3.5 (CATHETERS) ×1 IMPLANT
CATH LAUNCHER 6FR JR4 (CATHETERS) ×1 IMPLANT
CATH MICROCATH NAVVUS (MICROCATHETER) IMPLANT
DEVICE RAD COMP TR BAND LRG (VASCULAR PRODUCTS) ×1 IMPLANT
GLIDESHEATH SLEND SS 6F .021 (SHEATH) ×1 IMPLANT
GUIDEWIRE INQWIRE 1.5J.035X260 (WIRE) IMPLANT
INQWIRE 1.5J .035X260CM (WIRE) ×2
KIT ENCORE 26 ADVANTAGE (KITS) ×1 IMPLANT
KIT HEART LEFT (KITS) ×2 IMPLANT
MICROCATHETER NAVVUS (MICROCATHETER) ×2
PACK CARDIAC CATHETERIZATION (CUSTOM PROCEDURE TRAY) ×2 IMPLANT
STENT SVELTE  RX 2.50 X 28MM (Permanent Stent) ×1 IMPLANT
STENT SVELTE  RX 3.00 X 28MM (Permanent Stent) ×1 IMPLANT
SYR MEDRAD MARK V 150ML (SYRINGE) ×2 IMPLANT
TRANSDUCER W/STOPCOCK (MISCELLANEOUS) ×2 IMPLANT
TUBING CIL FLEX 10 FLL-RA (TUBING) ×2 IMPLANT
WIRE COUGAR XT STRL 190CM (WIRE) ×1 IMPLANT

## 2017-07-15 NOTE — Interval H&P Note (Signed)
Cath Lab Visit (complete for each Cath Lab visit)  Clinical Evaluation Leading to the Procedure:   ACS: No.  Non-ACS:    Anginal Classification: CCS II  Anti-ischemic medical therapy: Maximal Therapy (2 or more classes of medications)  Non-Invasive Test Results: Intermediate-risk stress test findings: cardiac mortality 1-3%/year  Prior CABG: No previous CABG  History and Physical Interval Note:  07/15/2017 12:55 PM  Lance [name]  has presented today for surgery, with the diagnosis of Chest Pain, Abnormal Stress Test  The various methods of treatment have been discussed with the patient and family. After consideration of risks, benefits and other options for treatment, the patient has consented to  Procedure(s): LEFT HEART CATH AND CORONARY ANGIOGRAPHY (N/A) as a surgical intervention .  The patient's history has been reviewed, patient examined, no change in status, stable for surgery.  I have reviewed the patient's chart and labs.  Questions were answered to the patient's satisfaction.     Tonny Bollman

## 2017-07-15 NOTE — Discharge Summary (Signed)
Discharge Summary    Patient ID: Lance [name],  MRN: [id number], DOB/AGE: 1975-10-19 41 y.o.  Admit date: 07/15/2017 Discharge date: 07/16/2017  Primary Care Provider: [facility] Primary Cardiologist: Domenic Polite   Discharge Diagnoses    Active Problems:   Coronary artery disease with exertional angina [facility])   Essential hypertension, benign   Type 2 diabetes mellitus, uncontrolled ([place])   Mixed hyperlipidemia   Allergies Allergies  Allergen Reactions  . Bee Venom Swelling  . Penicillins Itching and Rash    Has patient had a PCN reaction causing immediate rash, facial/tongue/throat swelling, SOB or lightheadedness with hypotension: Yes Has patient had a PCN reaction causing severe rash involving mucus membranes or skin necrosis: No Has patient had a PCN reaction that required hospitalization: No Has patient had a PCN reaction occurring within the last 10 years: No If all of the above answers are "NO", then may proceed with Cephalosporin use.     Diagnostic Studies/Procedures    Cath: 07/15/17  Conclusion   1. Three vessel CAD  Moderate eccentric proximal LAD stenosis with normal FFR analysis  Severe LCx/OM stenosis treated successfully with a Svelte DES through the Optimize clinical trial  Severe RCA stenosis (hemodynamically significant by FFR) treated successfully with a Svelte DES through the Optimize clinical trial 2. Normal/vigorous LV function with LVEF >65%  DAPT with ASA and plavix x 12 months minimum.   **Pt blinded to stent type**  _____________   History of Present Illness      41 y.o. male with history of hypertension, diabetes, HLD, tobacco abuse, obesity, family history of CAD. He had an admission in 08/2016 with chest pain and ruled out for an MI. Stress Myoview was negative for ischemia. Prior history of SVT converted with adenosine in the ER 2015.  Patient was hospitalized chest pain 05/12/17.  His diabetes was poorly  controlled. Triglyceride levels were 1145 HDL 19 and LDL unable to calculate. Zetia was added to his statin. Started on low-dose Imdur.  F/U cardiac CT finally done 07/10/17. Poor quality study but calcium score 207 98 th percentile and suggested hemodynamically significant lesion in mid RCA.  At his follow up appt with Dr. Johnsie Cancel on 07/14/17 the option of cath was discussed and patient was agreeable. He was set up for outpatient cath the following day.  Hospital Course     Underwent outpatient cath with Dr. Burt Knack noted above with 3 vessel disease noted. Moderate pLAD stenosis with normal FFR analysis, but severe Lcx/OM stenosis that was successfully treated with PCI/DESx1. Also severe RCA stenosis that was hemodynamically significant via FFR, that was treated with PC/DESx1 through the Optimize trial. Normal EF on LV gram. Plan for DAPT with ASA/plavix for at least one year. Post cath labs were stable with Cr 0.91 and Hgb 15.1. No complications noted post cath. Worked well with cardiac rehab without any recurrent chest pain.   Lance [name] was seen by Dr. Gwenlyn Found and determined stable for discharge home. Follow up in the office has been arranged. Medications are listed below.   _____________  Discharge Vitals Blood pressure 116/69, pulse 61, temperature 97.9 F (36.6 C), temperature source Oral, resp. rate 17, height 5' 11"  (1.803 m), weight 251 lb 12.3 oz (114.2 kg), SpO2 99 %.  Filed Weights   07/15/17 1113 07/16/17 0305  Weight: 254 lb (115.2 kg) 251 lb 12.3 oz (114.2 kg)    Labs & Radiologic Studies    CBC  Recent Labs  07/16/17 0340  WBC 11.8*  HGB 15.1  HCT 46.6  MCV 90.8  PLT 322   Basic Metabolic Panel  Recent Labs  07/16/17 0340  NA 137  K 4.0  CL 107  CO2 24  GLUCOSE 157*  BUN 14  CREATININE 0.91  CALCIUM 8.5*   Liver Function Tests No results for input(s): AST, ALT, ALKPHOS, BILITOT, PROT, ALBUMIN in the last 72 hours. No results for input(s): LIPASE,  AMYLASE in the last 72 hours. Cardiac Enzymes  Recent Labs  07/15/17 1352 07/15/17 2129 07/16/17 0340  TROPONINI <0.03 <0.03 0.04*   BNP Invalid input(s): POCBNP D-Dimer No results for input(s): DDIMER in the last 72 hours. Hemoglobin A1C No results for input(s): HGBA1C in the last 72 hours. Fasting Lipid Panel No results for input(s): CHOL, HDL, LDLCALC, TRIG, CHOLHDL, LDLDIRECT in the last 72 hours. Thyroid Function Tests No results for input(s): TSH, T4TOTAL, T3FREE, THYROIDAB in the last 72 hours.  Invalid input(s): FREET3 _____________  Ct Coronary Morph W/cta Cor W/score W/ca W/cm &/or Wo/cm  Addendum Date: 07/10/2017   ADDENDUM REPORT: 07/10/2017 10:19 EXAM: OVER-READ INTERPRETATION  CT CHEST The following report is an over-read performed by radiologist Dr. Norlene Duel [facility] on 07/10/2017. This over-read does not include interpretation of cardiac or coronary anatomy or pathology. The coronary CTA interpretation by the cardiologist is attached. COMPARISON:  None. FINDINGS: The visualized portions of the airway appear patent. Unremarkable appearance of the esophagus. No mediastinal adenopathy identified. No pleural effusion. Small nodule in the left lower lobe measures 4 mm, image 34 of series 12. No atelectasis or airspace consolidation. No acute abnormality noted within the upper abdomen. There is degenerative disc disease identified within the thoracic spine. IMPRESSION: 1. 4 mm subpleural nodule in the left lower lobe is identified. No follow-up needed if patient is low-risk. Non-contrast chest CT can be considered in 12 months if patient is high-risk. This recommendation follows the consensus statement: Guidelines for Management of Incidental Pulmonary Nodules Detected on CT Images: From the Fleischner Society 2017; Radiology 2017; 284:228-243. Electronically Signed   By: Kerby Moors M.D.   On: 07/10/2017 10:19   Result Date: 07/10/2017 CLINICAL DATA:   Chest pain EXAM: Cardiac CTA MEDICATIONS: Sub lingual nitro. 20m and lopressor 536mTECHNIQUE: The patient was scanned on a [id number] scanner. Gantry rotation speed was 250 msecs. Collimation was .6 mm. A 100 kV prospective scan was triggered in the ascending thoracic aorta at 140 HU's Full mA was used between 35% and 75% of the R-R interval. Average HR during the scan was 62 bpm. The 3D data set was interpreted on a dedicated work station using MPR, MIP and VRT modes. A total of 80cc of contrast was used. FINDINGS: Non-cardiac: [facility] No significant findings on limited lung and soft tissue windows. Calcium Score:  207 with calcium noted in LAD and RCA Coronary Arteries: Right dominant with no anomalies LM:  Less than 30% calcified stenosis LAD: Poorly visualized 50% mixed plaque in proximal vessel mid/distal vessel diagonals not well visualized Circumflex:  Poorly visualized RCA: Dominant with less than 50% mixed plaque proximally Mid vessel heavily calcified with >75% calcified stenosis Motion artifact and mis registration in distal vessel IMPRESSION: 1) Poor quality study with poor visualization of LAD/Circumflex However appears to be a hemodynamically significant >75% calcified stenosis in mid RCA Study too poor to send for FFR CT. Given admission for SSCP, DM, Smoking and family history  of premature CAD he will be referred for diagnostic heart catheterization 2) Calcium score 207 98 th percentile for age and sex 3) Aortic root normal 3.1 cm Jenkins Rouge Electronically Signed: By: Jenkins Rouge M.D. On: 07/10/2017 09:25   Disposition   Pt is being discharged home today in good condition.  Follow-up Plans & Appointments    Follow-up Information    Charlie Pitter, PA-C Follow up on 07/27/2017.   Specialties:  Cardiology, Radiology Why:  at 1:30pm for your follow up appt.  Contact information: [place] [address] [phone]           Discharge Instructions    Amb Referral to Cardiac Rehabilitation    Complete by:  As directed    Works in [place]   Diagnosis:  Coronary Stents   Call MD for:  redness, tenderness, or signs of infection (pain, swelling, redness, odor or green/yellow discharge around incision site)    Complete by:  As directed    Diet - low sodium heart healthy    Complete by:  As directed    Discharge instructions    Complete by:  As directed    Radial Site Care Refer to this sheet in the next few weeks. These instructions provide you with information on caring for yourself after your procedure. Your caregiver may also give you more specific instructions. Your treatment has been planned according to current medical practices, but problems sometimes occur. Call your caregiver if you have any problems or questions after your procedure. HOME CARE INSTRUCTIONS You may shower the day after the procedure.Remove the bandage (dressing) and gently wash the site with plain soap and water.Gently pat the site dry.  Do not apply powder or lotion to the site.  Do not submerge the affected site in water for 3 to 5 days.  Inspect the site at least twice daily.  Do not flex or bend the affected arm for 24 hours.  No lifting over 5 pounds (2.3 kg) for 5 days after your procedure.  Do not drive home if you are discharged the same day of the procedure. Have someone else drive you.  You may drive 24 hours after the procedure unless otherwise instructed by your caregiver.  What to expect: Any bruising will usually fade within 1 to 2 weeks.  Blood that collects in the tissue (hematoma) may be painful to the touch. It should usually decrease in size and tenderness within 1 to 2 weeks.  SEEK IMMEDIATE MEDICAL CARE IF: You have unusual pain at the radial site.  You have redness, warmth, swelling, or pain at the radial site.  You have drainage (other than a small amount of blood on the dressing).  You have chills.  You have  a fever or persistent symptoms for more than 72 hours.  You have a fever and your symptoms suddenly get worse.  Your arm becomes pale, cool, tingly, or numb.  You have heavy bleeding from the site. Hold pressure on the site.   PLEASE DO NOT MISS ANY DOSES OF YOUR PLAVIX!!!!! Also keep a log of you blood pressures and bring back to your follow up appt. Please call the office with any questions.   Patients taking blood thinners should generally stay away from medicines like ibuprofen, Advil, Motrin, naproxen, and Aleve due to risk of stomach bleeding. You may take Tylenol as directed or talk to your primary doctor about alternatives.   Increase activity slowly    Complete  by:  As directed       Discharge Medications     Medication List    TAKE these medications   ALPRAZolam 1 MG tablet Commonly known as:  XANAX Take 1 mg by mouth daily as needed for anxiety.   aspirin EC 81 MG tablet Take 81 mg by mouth daily.   atorvastatin 80 MG tablet Commonly known as:  LIPITOR Take 1 tablet (80 mg total) by mouth daily.   blood glucose meter kit and supplies Dispense based on patient and insurance preference. Use up to four times daily as directed. (FOR ICD-9 250.00, 250.01).   carvedilol 3.125 MG tablet Commonly known as:  COREG Take 1 tablet (3.125 mg total) by mouth 2 (two) times daily with a meal.   clopidogrel 75 MG tablet Commonly known as:  PLAVIX Take 1 tablet (75 mg total) by mouth daily with breakfast.   empagliflozin 10 MG Tabs tablet Commonly known as:  JARDIANCE Take 10 mg by mouth daily.   ezetimibe 10 MG tablet Commonly known as:  ZETIA Take 1 tablet (10 mg total) by mouth daily.   HUMALOG KWIKPEN 100 UNIT/ML KiwkPen Generic drug:  insulin lispro Inject 10-16 Units into the skin 3 (three) times daily before meals.   Insulin Detemir 100 UNIT/ML Pen Commonly known as:  LEVEMIR FLEXPEN Inject 60 Units into the skin daily at 10 pm.   isosorbide mononitrate 30 MG  24 hr tablet Commonly known as:  IMDUR Take 0.5 tablets (15 mg total) by mouth daily.   lisinopril 40 MG tablet Commonly known as:  PRINIVIL,ZESTRIL Take 40 mg by mouth daily.   metFORMIN 500 MG tablet Commonly known as:  GLUCOPHAGE Take 500 mg by mouth 2 (two) times daily with a meal.   nitroGLYCERIN 0.4 MG SL tablet Commonly known as:  NITROSTAT Place 1 tablet (0.4 mg total) under the tongue every 5 (five) minutes x 3 doses as needed for chest pain.        Aspirin prescribed at discharge?  Yes High Intensity Statin Prescribed? (Lipitor 40-86m or Crestor 20-478m: Yes Beta Blocker Prescribed? Yes For EF <40%, was ACEI/ARB Prescribed? Yes ADP Receptor Inhibitor Prescribed? (i.e. Plavix etc.-Includes Medically Managed Patients): Yes For EF <40%, Aldosterone Inhibitor Prescribed? No: EF ok Was EF assessed during THIS hospitalization? Yes Was Cardiac Rehab II ordered? (Included Medically managed Patients): Yes   Outstanding Labs/Studies   N/a  Duration of Discharge Encounter   Greater than 30 minutes including physician time.  Signed, LiReino BellisP-C 07/16/2017, 9:28 AM  Agree with note by LiReino BellisP-C  Mr. EaLafons status post PCI and stenting of his mid RCA and circumflex obtuse marginal branch yesterday by Dr. CoBurt Knackith a negative FFR of his proximal LAD. He had normal LV function. He is stable for discharge home this morning on dual antiplatelet therapy. His exam is benign. He will follow-up with Dr. NiJohnsie Cancel   JoLorretta HarpM.D., FAPhilipsburgFATrinity Hospital Of AugustaFALaverta BaltimoreSOradell[address] Country Club CourtSuNipomoNC  [phone]/08/2017 9:58 AM

## 2017-07-15 NOTE — Research (Signed)
[place] Study Informed Consent   Subject Name: Lance [name]  Subject met inclusion and exclusion criteria.  The informed consent form, study requirements and expectations were reviewed with the subject and questions and concerns were addressed prior to the signing of the consent form.  The subject verbalized understanding of the trial requirements.  The subject agreed to participate in the OPTIMIZE trial and signed the informed consent at 1240 on 07/15/2017.  The informed consent was obtained prior to performance of any protocol-specific procedures for the subject.  A copy of the signed informed consent was given to the subject and a copy was placed in the subject's medical record. The subject will only be enrolled if the angiographic criteria is met.  [name] 07/15/2017, 12:46 PM

## 2017-07-15 NOTE — H&P (View-Only) (Signed)
Cardiology Office Note    Date:  07/14/2017   ID:  DRAEDEN KELLMAN, DOB 15-Dec-1975, MRN [id number]  PCP:  [facility]  Cardiologist: Dr. Domenic Polite  No chief complaint on file.   History of Present Illness:  Lance [name] is a 41 y.o. male with history of hypertension, diabetes, HLD, tobacco abuse, obesity, family history of CAD. He had an admission in 08/2016 with chest pain and ruled out for an MI. Stress Myoview was negative for ischemia. Prior history of SVT converted with adenosine in the ER 2015.  Patient was hospitalized chest pain 05/12/17  His diabetes was poorly controlled. Triglyceride levels were 1145 HDL 19 and LDL unable to calculate. Zetia was added to his statin. Started on low-dose Imdur.  F/U cardiac CT finally done 07/10/17 Poor quality study but calcium score 207 98 th percentile and suggested hemodynamically significant lesion In mid RCA  Disucssed cath procedure with him in am with Dr Burt Knack. He will take half his long acting insulin and no glucophage Or oral hypoglycemics Risks including stoke need for emergency surgery and bleeding discussed willing to proceed   Past Medical History:  Diagnosis Date  . Essential hypertension   . History of nuclear stress test 08/2016   low risk study  . Hyperlipemia   . Irregular heart beat   . Type 2 diabetes mellitus ([place])     Past Surgical History:  Procedure Laterality Date  . CHOLECYSTECTOMY      Current Medications: Current Meds  Medication Sig  . ALPRAZolam (XANAX) 1 MG tablet Take 1 mg by mouth daily as needed for anxiety.  Marland Kitchen aspirin EC 81 MG tablet Take 81 mg by mouth daily.  Marland Kitchen atorvastatin (LIPITOR) 80 MG tablet Take 1 tablet (80 mg total) by mouth daily.  . blood glucose meter kit and supplies Dispense based on patient and insurance preference. Use up to four times daily as directed. (FOR ICD-9 250.00, 250.01).  . carvedilol (COREG) 3.125 MG tablet Take 1 tablet (3.125 mg total) by mouth 2  (two) times daily with a meal.  . empagliflozin (JARDIANCE) 10 MG TABS tablet Take 10 mg by mouth daily.  Marland Kitchen ezetimibe (ZETIA) 10 MG tablet Take 1 tablet (10 mg total) by mouth daily.  . Insulin Detemir (LEVEMIR FLEXPEN) 100 UNIT/ML Pen Inject 60 Units into the skin daily at 10 pm.  . insulin lispro (HUMALOG KWIKPEN) 100 UNIT/ML KiwkPen Inject 10-16 Units into the skin 3 (three) times daily before meals.  . isosorbide mononitrate (IMDUR) 30 MG 24 hr tablet Take 0.5 tablets (15 mg total) by mouth daily.  Marland Kitchen lisinopril (PRINIVIL,ZESTRIL) 40 MG tablet Take 40 mg by mouth daily.  . metFORMIN (GLUCOPHAGE) 500 MG tablet Take 500 mg by mouth 2 (two) times daily with a meal.  . nitroGLYCERIN (NITROSTAT) 0.4 MG SL tablet Place 1 tablet (0.4 mg total) under the tongue every 5 (five) minutes x 3 doses as needed for chest pain.     Allergies:   Bee venom and Penicillins   Social History   Social History  . Marital status: Single    Spouse name: N/A  . Number of children: N/A  . Years of education: N/A   Social History Main Topics  . Smoking status: Current Some Day Smoker    Packs/day: 0.75    Types: Cigarettes  . Smokeless tobacco: Never Used     Comment: 1 - 1 1/2 pack a day.  . Alcohol use No  .  Drug use: No  . Sexual activity: Yes    Birth control/ protection: None   Other Topics Concern  . None   Social History Narrative  . None     Family History:  The patient's family history includes Diabetes in his mother; Gout in his mother; Heart attack (age of onset: 57) in his father; Heart disease (age of onset: 62) in his mother.   ROS:   Please see the history of present illness.    Review of Systems  Constitution: Negative.  HENT: Negative.   Cardiovascular: Negative.   Respiratory: Negative.   Endocrine: Negative.   Hematologic/Lymphatic: Negative.   Musculoskeletal: Negative.   Gastrointestinal: Negative.   Genitourinary: Negative.   Neurological: Negative.    All other  systems reviewed and are negative.   PHYSICAL EXAM:   VS:  BP 110/64 (BP Location: Right Arm)   Pulse 84   Ht 5' 11" (1.803 m)   Wt 254 lb (115.2 kg)   SpO2 97%   BMI 35.43 kg/m   Physical Exam   Affect appropriate Healthy:  appears stated age HEENT: normal Neck supple with no adenopathy JVP normal no bruits no thyromegaly Lungs clear with no wheezing and good diaphragmatic motion Heart:  S1/S2 no murmur, no rub, gallop or click PMI normal Abdomen: benighn, BS positve, no tenderness, no AAA no bruit.  No HSM or HJR Distal pulses intact with no bruits No edema Neuro non-focal Skin warm and dry No muscular weakness   Wt Readings from Last 3 Encounters:  07/14/17 254 lb (115.2 kg)  07/01/17 253 lb (114.8 kg)  05/27/17 247 lb (112 kg)      Studies/Labs Reviewed:   EKG:  05/12/17 SR normal ECG   Recent Labs: 12/24/2016: ALT 37 05/10/2017: Magnesium 1.9; TSH 1.318 07/11/2017: BUN 15; Creat 0.97; Hemoglobin 15.7; Platelets 215; Potassium 4.6; Sodium 138   Lipid Panel    Component Value Date/Time   CHOL 160 05/11/2017 0415   TRIG 1,145 (H) 05/11/2017 0415   HDL 19 (L) 05/11/2017 0415   CHOLHDL 8.4 05/11/2017 0415   VLDL UNABLE TO CALCULATE IF TRIGLYCERIDE OVER 400 mg/dL 05/11/2017 0415   LDLCALC UNABLE TO CALCULATE IF TRIGLYCERIDE OVER 400 mg/dL 05/11/2017 0415    Additional studies/ records that were reviewed today include:    Echocardiogram 09/01/2016 Left ventricle: The cavity size was normal. Wall thickness was   increased in a pattern of mild LVH. Systolic function was normal.   The estimated ejection fraction was in the range of 60% to 65%.   Wall motion was normal; there were no regional wall motion   abnormalities. Doppler parameters are consistent with abnormal   left ventricular relaxation (grade 1 diastolic dysfunction). - Aortic valve: Trileaflet; mildly calcified leaflets. There was no   significant regurgitation. - Mitral valve: There was trivial  regurgitation. - Right atrium: Central venous pressure (est): 3 mm Hg. - Tricuspid valve: There was physiologic regurgitation. - Pericardium, extracardiac: A prominent pericardial fat pad was   present.     09/01/2016  No diagnostic ST segment changes to indicate ischemia. No significant arrhythmias.  Blood pressure demonstrated a hypertensive response to exercise. Lexiscan also utilized given poor exercise tolerance.  No significant myocardial perfusion defect extending indicate scar or ischemia.  This is a low risk study.  Nuclear stress EF: 67%.     ASSESSMENT:    Chest pain CAD   PLAN:   Chest Pain: multiple risk factors including poorly controlled DM positive   family history HTN HLD. High calcium Score for age and cardiac CT although poor quality suggests hemodynamically significant mid RCA stenosis Discussed options and plan for diagnostic heart cath this week. Lab called orders written. Risks including stroke MI, need for emergency surgery discussed willing to proceed. No contrast issues with CTA  DM:  Discussed low carb diet.  Target hemoglobin A1c is 6.5 or less.  Continue current medications.1/2 Dose LA insulin tonight and no oral hypoglycemics  HTN:  Well controlled.  Continue current medications and low sodium Dash type diet.    Cholesterol:  Continue statin and zetia.  Lab Results  Component Value Date   LDLCALC UNABLE TO CALCULATE IF TRIGLYCERIDE OVER 400 mg/dL 05/11/2017

## 2017-07-16 ENCOUNTER — Encounter (HOSPITAL_COMMUNITY): Payer: Self-pay | Admitting: Cardiovascular Disease

## 2017-07-16 DIAGNOSIS — I25118 Atherosclerotic heart disease of native coronary artery with other forms of angina pectoris: Secondary | ICD-10-CM | POA: Diagnosis not present

## 2017-07-16 DIAGNOSIS — I10 Essential (primary) hypertension: Secondary | ICD-10-CM | POA: Diagnosis not present

## 2017-07-16 DIAGNOSIS — Z8249 Family history of ischemic heart disease and other diseases of the circulatory system: Secondary | ICD-10-CM | POA: Diagnosis not present

## 2017-07-16 DIAGNOSIS — E669 Obesity, unspecified: Secondary | ICD-10-CM | POA: Diagnosis not present

## 2017-07-16 DIAGNOSIS — I25119 Atherosclerotic heart disease of native coronary artery with unspecified angina pectoris: Secondary | ICD-10-CM | POA: Diagnosis not present

## 2017-07-16 DIAGNOSIS — E782 Mixed hyperlipidemia: Secondary | ICD-10-CM | POA: Diagnosis not present

## 2017-07-16 LAB — BASIC METABOLIC PANEL
ANION GAP: 6 (ref 5–15)
BUN: 14 mg/dL (ref 6–20)
CALCIUM: 8.5 mg/dL — AB (ref 8.9–10.3)
CO2: 24 mmol/L (ref 22–32)
Chloride: 107 mmol/L (ref 101–111)
Creatinine, Ser: 0.91 mg/dL (ref 0.61–1.24)
GLUCOSE: 157 mg/dL — AB (ref 65–99)
Potassium: 4 mmol/L (ref 3.5–5.1)
SODIUM: 137 mmol/L (ref 135–145)

## 2017-07-16 LAB — CBC
HCT: 46.6 % (ref 39.0–52.0)
HEMOGLOBIN: 15.1 g/dL (ref 13.0–17.0)
MCH: 29.4 pg (ref 26.0–34.0)
MCHC: 32.4 g/dL (ref 30.0–36.0)
MCV: 90.8 fL (ref 78.0–100.0)
Platelets: 187 10*3/uL (ref 150–400)
RBC: 5.13 MIL/uL (ref 4.22–5.81)
RDW: 13.5 % (ref 11.5–15.5)
WBC: 11.8 10*3/uL — ABNORMAL HIGH (ref 4.0–10.5)

## 2017-07-16 LAB — GLUCOSE, CAPILLARY
GLUCOSE-CAPILLARY: 124 mg/dL — AB (ref 65–99)
GLUCOSE-CAPILLARY: 114 mg/dL — AB (ref 65–99)

## 2017-07-16 LAB — TROPONIN I: TROPONIN I: 0.04 ng/mL — AB (ref <0.03)

## 2017-07-16 MED ORDER — ANGIOPLASTY BOOK
Freq: Once | Status: DC
  Filled 2017-07-16: qty 1

## 2017-07-16 MED ORDER — CLOPIDOGREL BISULFATE 75 MG PO TABS: 75.0000 mg | ORAL_TABLET | Freq: Every day | ORAL | 2 refills | Status: DC

## 2017-07-16 MED FILL — Nitroglycerin IV Soln 100 MCG/ML in D5W: INTRA_ARTERIAL | Qty: 10 | Status: AC

## 2017-07-16 MED FILL — Lidocaine HCl Local Inj 2%: INTRAMUSCULAR | Qty: 10 | Status: AC

## 2017-07-16 NOTE — Care Management Note (Signed)
Case Management Note  Patient Details  Name: Lance [name] MRN: [id number] Date of Birth: 05/28/1976  Subjective/Objective:   From home, s/p left heart cath and coronary angiography successful DES, will be on plavix.                 Action/Plan: NCM will follow for dc needs.  Expected Discharge Date:                  Expected Discharge Plan:  Home/Self Care  In-House Referral:     Discharge planning Services  CM Consult  Post Acute Care Choice:    Choice offered to:     DME Arranged:    DME Agency:     HH Arranged:    HH Agency:     Status of Service:  Completed, signed off  If discussed at [employer] of Stay Meetings, dates discussed:    Additional Comments:  Leone Haven, RN 07/16/2017, 9:13 AM

## 2017-07-16 NOTE — Progress Notes (Signed)
CARDIAC REHAB PHASE I   PRE:  Rate/Rhythm: 75 SR  BP:  Supine: 117/65  Sitting:   Standing:    SaO2: 96%RA  MODE:  Ambulation: 700 ft   POST:  Rate/Rhythm: 78 SR  BP:  Supine:   Sitting: 131/60  Standing:    SaO2: 98%RA 0830-0920 Pt walked 700 ft with steady gait. Tolerated well. No CP. Discussed the importance of plavix with stent. Reviewed NTG use, ex ed, carb counting and heart healthy food choices. Gave smoking cessation handout and fake cigarette. Encouraged to call [phone] as needed. Discussed CRP 2 and will refer to [place] since he works in [employer]. Pt getting married next Saturday.   Luetta Nutting, RN BSN  07/16/2017 9:18 AM

## 2017-07-16 NOTE — Progress Notes (Signed)
TR BAND REMOVAL  LOCATION:    R radial  DEFLATED PER PROTOCOL:    Yes  TIME BAND OFF / DRESSING APPLIED:    19:55  SITE UPON ARRIVAL:    Level 0  SITE AFTER BAND REMOVAL:    Level 0  CIRCULATION SENSATION AND MOVEMENT:    Within Normal Limits   Yes  COMMENTS: Pt tolerated procedure well. VSS. Post removal instructions given.

## 2017-07-17 ENCOUNTER — Telehealth (HOSPITAL_COMMUNITY): Payer: Self-pay

## 2017-07-17 NOTE — Telephone Encounter (Signed)
Verified insurance. $35 co-payment, no deductible, out of pocket amount is $6,000/$850.83 has been met, no co-insurance, and pre-authorization is required. Passport/reference # [id number]

## 2017-07-20 NOTE — Telephone Encounter (Signed)
Patient is not interested in program. Will close referral.

## 2017-07-22 ENCOUNTER — Encounter: Payer: Self-pay | Admitting: Physician Assistant

## 2017-07-22 ENCOUNTER — Ambulatory Visit (INDEPENDENT_AMBULATORY_CARE_PROVIDER_SITE_OTHER): Payer: 59 | Admitting: Physician Assistant

## 2017-07-22 VITALS — BP 118/58 | HR 73 | Ht 71.0 in | Wt 256.0 lb

## 2017-07-22 DIAGNOSIS — E6609 Other obesity due to excess calories: Secondary | ICD-10-CM | POA: Diagnosis not present

## 2017-07-22 DIAGNOSIS — I10 Essential (primary) hypertension: Secondary | ICD-10-CM

## 2017-07-22 DIAGNOSIS — E782 Mixed hyperlipidemia: Secondary | ICD-10-CM | POA: Diagnosis not present

## 2017-07-22 DIAGNOSIS — I2510 Atherosclerotic heart disease of native coronary artery without angina pectoris: Secondary | ICD-10-CM

## 2017-07-22 DIAGNOSIS — Z6834 Body mass index (BMI) 34.0-34.9, adult: Secondary | ICD-10-CM | POA: Diagnosis not present

## 2017-07-22 DIAGNOSIS — F17200 Nicotine dependence, unspecified, uncomplicated: Secondary | ICD-10-CM | POA: Diagnosis not present

## 2017-07-22 MED ORDER — CARVEDILOL 6.25 MG PO TABS: 6.2500 mg | ORAL_TABLET | Freq: Two times a day (BID) | ORAL | 3 refills | Status: DC

## 2017-07-22 NOTE — Patient Instructions (Signed)
Medication Instructions:  Your physician has recommended you make the following change in your medication:  Stop taking Imdur  Increase Coreg to 6.25 mg Two Times Daily    Labwork: NONE   Testing/Procedures: NONE   Follow-Up: Your physician recommends that you schedule a follow-up appointment in: 2 Months with Dr. Diona BrownerMcDowell   Any Other Special Instructions Will Be Listed Below (If Applicable).     If you need a refill on your cardiac medications before your next appointment, please call your pharmacy. Thank you for choosing [facility]!

## 2017-07-22 NOTE — Progress Notes (Signed)
Cardiology Office Note    Date:  07/22/2017   ID:  Lance [name], DOB Mar 05, 1976, MRN [id number]  PCP:  [facility]  Cardiologist: Dr. Domenic Polite  Chief Complaint  Patient presents with  . Follow-up    History of Present Illness:  Lance [name] is a 41 y.o. male with history of chest pain 08/2016 ruled out for an MI and stress Myoview was negative. Also prior SVT converted with adenosine 2015, hypertension, DM, HLD, tobacco abuse, obesity and family history of CAD. CT scan 07/10/17 poor quality study but calcium score 207 in the 98th percentile suggested hemodynamically significant lesion in the mid RCA. He underwent DES to the circumflex/OM and RCA through the optimize trial. He had a moderate LAD stenosis with normal FFR analysis. Normal LVEF.  Patient comes in today feeling much better. No further chest pain. Breathing better.    Past Medical History:  Diagnosis Date  . CAD (coronary artery disease), native coronary artery    10/18 PCI/DES to mRCA, and OM, with total occlusion of dLCx with collaterals.   . Essential hypertension   . GERD (gastroesophageal reflux disease)   . Heart murmur   . History of kidney stones   . History of nuclear stress test 08/2016   low risk study  . Hyperlipemia   . Irregular heart beat   . SVT (supraventricular tachycardia) ([place]) 2015   hx converted w/adenosine in the ER /notes 07/14/2017  . Type 2 diabetes mellitus ([place])     Past Surgical History:  Procedure Laterality Date  . CORONARY ANGIOPLASTY WITH STENT PLACEMENT  07/15/2017   "2 stents"  . LAPAROSCOPIC CHOLECYSTECTOMY    . LEFT HEART CATH AND CORONARY ANGIOGRAPHY N/A 07/15/2017   Procedure: LEFT HEART CATH AND CORONARY ANGIOGRAPHY;  Surgeon: Sherren Mocha, MD;  Location: [facility];  Service: Cardiovascular;  Laterality: N/A;    Current Medications: Current Meds  Medication Sig  . ALPRAZolam (XANAX) 1 MG tablet Take 1 mg by mouth daily as needed for  anxiety.  Marland Kitchen aspirin EC 81 MG tablet Take 81 mg by mouth daily.  Marland Kitchen atorvastatin (LIPITOR) 80 MG tablet Take 1 tablet (80 mg total) by mouth daily.  . blood glucose meter kit and supplies Dispense based on patient and insurance preference. Use up to four times daily as directed. (FOR ICD-9 250.00, 250.01).  . carvedilol (COREG) 3.125 MG tablet Take 1 tablet (3.125 mg total) by mouth 2 (two) times daily with a meal.  . clopidogrel (PLAVIX) 75 MG tablet Take 1 tablet (75 mg total) by mouth daily with breakfast.  . empagliflozin (JARDIANCE) 10 MG TABS tablet Take 10 mg by mouth daily.  Marland Kitchen ezetimibe (ZETIA) 10 MG tablet Take 1 tablet (10 mg total) by mouth daily.  . Insulin Detemir (LEVEMIR FLEXPEN) 100 UNIT/ML Pen Inject 60 Units into the skin daily at 10 pm.  . insulin lispro (HUMALOG KWIKPEN) 100 UNIT/ML KiwkPen Inject 10-16 Units into the skin 3 (three) times daily before meals.  . isosorbide mononitrate (IMDUR) 30 MG 24 hr tablet Take 0.5 tablets (15 mg total) by mouth daily.  Marland Kitchen lisinopril (PRINIVIL,ZESTRIL) 40 MG tablet Take 40 mg by mouth daily.  . metFORMIN (GLUCOPHAGE) 500 MG tablet Take 500 mg by mouth 2 (two) times daily with a meal.  . nitroGLYCERIN (NITROSTAT) 0.4 MG SL tablet Place 1 tablet (0.4 mg total) under the tongue every 5 (five) minutes x 3 doses as needed for chest pain.  Allergies:   Bee venom and Penicillins   Social History   Social History  . Marital status: Single    Spouse name: N/A  . Number of children: N/A  . Years of education: N/A   Social History Main Topics  . Smoking status: Current Some Day Smoker    Packs/day: 0.25    Years: 26.00    Types: Cigarettes  . Smokeless tobacco: Never Used  . Alcohol use No  . Drug use: No  . Sexual activity: Yes    Birth control/ protection: None   Other Topics Concern  . None   Social History Narrative  . None     Family History:  The patient's family history includes Diabetes in his mother; Gout in his  mother; Heart attack (age of onset: 37) in his father; Heart disease (age of onset: 97) in his mother.   ROS:   Please see the history of present illness.    Review of Systems  Constitution: Negative.  HENT: Negative.   Cardiovascular: Negative.   Respiratory: Negative.   Endocrine: Negative.   Hematologic/Lymphatic: Negative.   Musculoskeletal: Negative.   Gastrointestinal: Negative.   Genitourinary: Negative.   Neurological: Negative.    All other systems reviewed and are negative.   PHYSICAL EXAM:   VS:  BP (!) 118/58   Pulse 73   Ht 5' 11"  (1.803 m)   Wt 256 lb (116.1 kg)   SpO2 96%   BMI 35.70 kg/m   Physical Exam  GEN: Well nourished, well developed, in no acute distress  Neck: no JVD, carotid bruits, or masses Cardiac:RRR; no murmurs, rubs, or gallops  Respiratory:  clear to auscultation bilaterally, normal work of breathing GI: soft, nontender, nondistended, + BS Ext: without cyanosis, clubbing, or edema, Good distal pulses bilaterally Neuro:  Alert and Oriented x 3 Psych: euthymic mood, full affect  Wt Readings from Last 3 Encounters:  07/22/17 256 lb (116.1 kg)  07/16/17 251 lb 12.3 oz (114.2 kg)  07/14/17 254 lb (115.2 kg)      Studies/Labs Reviewed:   EKG:  EKG is not ordered today.    Recent Labs: 12/24/2016: ALT 37 05/10/2017: Magnesium 1.9; TSH 1.318 07/16/2017: BUN 14; Creatinine, Ser 0.91; Hemoglobin 15.1; Platelets 187; Potassium 4.0; Sodium 137   Lipid Panel    Component Value Date/Time   CHOL 160 05/11/2017 0415   TRIG 1,145 (H) 05/11/2017 0415   HDL 19 (L) 05/11/2017 0415   CHOLHDL 8.4 05/11/2017 0415   VLDL UNABLE TO CALCULATE IF TRIGLYCERIDE OVER 400 mg/dL 05/11/2017 0415   LDLCALC UNABLE TO CALCULATE IF TRIGLYCERIDE OVER 400 mg/dL 05/11/2017 0415    Additional studies/ records that were reviewed today include:   Cath: 07/15/17   Conclusion    1. Three vessel CAD  Moderate eccentric proximal LAD stenosis with normal FFR  analysis  Severe LCx/OM stenosis treated successfully with a Svelte DES through the Optimize clinical trial  Severe RCA stenosis (hemodynamically significant by FFR) treated successfully with a Svelte DES through the Optimize clinical trial 2. Normal/vigorous LV function with LVEF >65%   DAPT with ASA and plavix x 12 months minimum.    **Pt blinded to stent type**      ASSESSMENT:    1. Coronary artery disease involving native coronary artery of native heart without angina pectoris   2. Essential hypertension, benign   3. Mixed hyperlipidemia   4. Current smoker   5. Class 1 obesity due to excess calories with serious  comorbidity and body mass index (BMI) of 34.0 to 34.9 in adult      PLAN:  In order of problems listed above:  CAD status post stenting of the circumflex/OM and RCA with normal LVEF 65%. On aspirin and Plavix for 12 months minimum. Has residual moderate eccentric proximal LAD with normal FFR analysis. Patient was blinded to stent type. Patient feels much better without angina. Follow-up with Dr. Domenic Polite in 2 months.  Essential hypertension blood pressure stable. We'll stop Imdur and increase Coreg to 6.25 mg twice a day  Mixed hyperlipidemia on Lipitor  Current smoker down to 5 cigarettes a day. Trying to quit. Counseling given  Obesity weight loss recommended    Medication Adjustments/Labs and Tests Ordered: Current medicines are reviewed at length with the patient today.  Concerns regarding medicines are outlined above.  Medication changes, Labs and Tests ordered today are listed in the Patient Instructions below. There are no Patient Instructions on file for this visit.   Signed, Ermalinda Barrios, PA-C  07/22/2017 2:06 PM    [facility] [address], [place], [place]  [address] Phone: [phone]; Fax: [phone]

## 2017-07-23 ENCOUNTER — Encounter (HOSPITAL_COMMUNITY): Payer: Self-pay | Admitting: Cardiovascular Disease

## 2017-07-27 ENCOUNTER — Ambulatory Visit: Payer: 59 | Admitting: Physician Assistant

## 2017-08-04 ENCOUNTER — Ambulatory Visit: Payer: 59 | Admitting: Adult Health

## 2017-08-11 ENCOUNTER — Encounter: Payer: Self-pay | Admitting: *Deleted

## 2017-08-11 DIAGNOSIS — Z006 Encounter for examination for normal comparison and control in clinical research program: Secondary | ICD-10-CM

## 2017-08-11 NOTE — Progress Notes (Signed)
OPTIMIZE Research 30 day follow up completed. Patient denies any chest pain or any changes in his health. Next study required visit is due no later than 08/may/19.

## 2017-08-16 ENCOUNTER — Other Ambulatory Visit: Payer: Self-pay | Admitting: "Endocrinology

## 2017-08-25 ENCOUNTER — Ambulatory Visit: Payer: Self-pay | Admitting: Nutrition

## 2017-08-25 ENCOUNTER — Encounter: Payer: Self-pay | Admitting: "Endocrinology

## 2017-08-25 ENCOUNTER — Ambulatory Visit (INDEPENDENT_AMBULATORY_CARE_PROVIDER_SITE_OTHER): Payer: 59 | Admitting: "Endocrinology

## 2017-08-25 VITALS — BP 136/82 | HR 70 | Ht 71.0 in | Wt 262.0 lb

## 2017-08-25 DIAGNOSIS — I10 Essential (primary) hypertension: Secondary | ICD-10-CM

## 2017-08-25 DIAGNOSIS — E782 Mixed hyperlipidemia: Secondary | ICD-10-CM

## 2017-08-25 DIAGNOSIS — E1165 Type 2 diabetes mellitus with hyperglycemia: Secondary | ICD-10-CM | POA: Insufficient documentation

## 2017-08-25 NOTE — Patient Instructions (Signed)

## 2017-08-25 NOTE — Progress Notes (Signed)
Subjective:    Patient ID: Lance [name], male    DOB: [birth year]. Patient is being seen in consultation for management of diabetes requested by  [facility]  Past Medical History:  Diagnosis Date  . CAD (coronary artery disease), native coronary artery    10/18 PCI/DES to mRCA, and OM, with total occlusion of dLCx with collaterals.   . Essential hypertension   . GERD (gastroesophageal reflux disease)   . Heart murmur   . History of kidney stones   . History of nuclear stress test 08/2016   low risk study  . Hyperlipemia   . Irregular heart beat   . SVT (supraventricular tachycardia) ([place]) 2015   hx converted w/adenosine in the ER /notes 07/14/2017  . Type 2 diabetes mellitus ([place])    Past Surgical History:  Procedure Laterality Date  . CORONARY ANGIOPLASTY WITH STENT PLACEMENT  07/15/2017   "2 stents"  . LAPAROSCOPIC CHOLECYSTECTOMY    . LEFT HEART CATH AND CORONARY ANGIOGRAPHY N/A 07/15/2017   Procedure: LEFT HEART CATH AND CORONARY ANGIOGRAPHY;  Surgeon: Sherren Mocha, MD;  Location: [facility];  Service: Cardiovascular;  Laterality: N/A;   Social History   Socioeconomic History  . Marital status: Single    Spouse name: None  . Number of children: None  . Years of education: None  . Highest education level: None  Social Needs  . Financial resource strain: None  . Food insecurity - worry: None  . Food insecurity - inability: None  . Transportation needs - medical: None  . Transportation needs - non-medical: None  Occupational History  . None  Tobacco Use  . Smoking status: Current Some Day Smoker    Packs/day: 0.25    Years: 26.00    Pack years: 6.50    Types: Cigarettes  . Smokeless tobacco: Never Used  Substance and Sexual Activity  . Alcohol use: No  . Drug use: No  . Sexual activity: Yes    Birth control/protection: None  Other Topics Concern  . None  Social History Narrative  . None   Outpatient Encounter Medications as  of 08/25/2017  Medication Sig  . ALPRAZolam (XANAX) 1 MG tablet Take 1 mg by mouth daily as needed for anxiety.  Marland Kitchen aspirin EC 81 MG tablet Take 81 mg by mouth daily.  Marland Kitchen atorvastatin (LIPITOR) 80 MG tablet Take 1 tablet (80 mg total) by mouth daily.  . blood glucose meter kit and supplies Dispense based on patient and insurance preference. Use up to four times daily as directed. (FOR ICD-9 250.00, 250.01).  . carvedilol (COREG) 6.25 MG tablet Take 1 tablet (6.25 mg total) by mouth 2 (two) times daily.  . clopidogrel (PLAVIX) 75 MG tablet Take 1 tablet (75 mg total) by mouth daily with breakfast.  . ezetimibe (ZETIA) 10 MG tablet Take 1 tablet (10 mg total) by mouth daily.  . Insulin Detemir (LEVEMIR FLEXPEN) 100 UNIT/ML Pen Inject 60 Units into the skin daily at 10 pm.  . insulin lispro (HUMALOG KWIKPEN) 100 UNIT/ML KiwkPen Inject 10-16 Units into the skin 3 (three) times daily before meals.  Marland Kitchen lisinopril (PRINIVIL,ZESTRIL) 40 MG tablet Take 40 mg by mouth daily.  . metFORMIN (GLUCOPHAGE) 500 MG tablet Take 500 mg by mouth 2 (two) times daily with a meal.  . nitroGLYCERIN (NITROSTAT) 0.4 MG SL tablet Place 1 tablet (0.4 mg total) under the tongue every 5 (five) minutes x 3 doses as needed for chest pain.  . [  DISCONTINUED] JARDIANCE 10 MG TABS tablet TAKE 1 TABLET BY MOUTH ONCE DAILY   No facility-administered encounter medications on file as of 08/25/2017.    ALLERGIES: Allergies  Allergen Reactions  . Bee Venom Swelling  . Penicillins Itching and Rash    Has patient had a PCN reaction causing immediate rash, facial/tongue/throat swelling, SOB or lightheadedness with hypotension: Yes Has patient had a PCN reaction causing severe rash involving mucus membranes or skin necrosis: No Has patient had a PCN reaction that required hospitalization: No Has patient had a PCN reaction occurring within the last 10 years: No If all of the above answers are "NO", then may proceed with Cephalosporin  use.    VACCINATION STATUS:  There is no immunization history on file for this patient.  Diabetes  He presents for his follow-up diabetic visit. He has type 2 diabetes mellitus. Onset time: He was diagnosed at approximate age of 27 years. His disease course has been improving (Was recently hospitalized due to hyperglycemia.). There are no hypoglycemic associated symptoms. Pertinent negatives for hypoglycemia include no confusion, headaches, pallor or seizures. Associated symptoms include blurred vision, polydipsia and polyuria. Pertinent negatives for diabetes include no chest pain, no fatigue, no polyphagia and no weakness. There are no hypoglycemic complications. Symptoms are improving. There are no diabetic complications. Risk factors for coronary artery disease include diabetes mellitus, dyslipidemia, family history, hypertension, male sex, obesity, tobacco exposure and sedentary lifestyle. Current diabetic treatment includes intensive insulin program (He is on Levemir 68 units BID and Humalog 10 units TIDAC.). His weight is increasing steadily. He is following a generally unhealthy diet. When asked about meal planning, he reported none. He has not had a previous visit with a dietitian. He rarely participates in exercise. His home blood glucose trend is decreasing steadily. His breakfast blood glucose range is generally 130-140 mg/dl. His lunch blood glucose range is generally 130-140 mg/dl. His dinner blood glucose range is generally 130-140 mg/dl. His bedtime blood glucose range is generally 130-140 mg/dl. His overall blood glucose range is 130-140 mg/dl. An ACE inhibitor/angiotensin II receptor blocker is being taken. He does not see a podiatrist.Eye exam is not current.  Hyperlipidemia  This is a chronic problem. The current episode started more than 1 year ago. The problem is controlled. Exacerbating diseases include diabetes and obesity. Pertinent negatives include no chest pain, myalgias or  shortness of breath. Current antihyperlipidemic treatment includes statins. Risk factors for coronary artery disease include diabetes mellitus, dyslipidemia, hypertension, male sex, obesity and a sedentary lifestyle.  Hypertension  This is a chronic problem. The current episode started more than 1 year ago. Associated symptoms include blurred vision. Pertinent negatives include no chest pain, headaches, neck pain, palpitations or shortness of breath. Risk factors for coronary artery disease include dyslipidemia, diabetes mellitus, male gender and smoking/tobacco exposure. Past treatments include ACE inhibitors.       Review of Systems  Constitutional: Negative for chills, fatigue, fever and unexpected weight change.  HENT: Negative for dental problem, mouth sores and trouble swallowing.   Eyes: Positive for blurred vision. Negative for visual disturbance.  Respiratory: Negative for cough, choking, chest tightness, shortness of breath and wheezing.   Cardiovascular: Negative for chest pain, palpitations and leg swelling.  Gastrointestinal: Negative for abdominal distention, abdominal pain, constipation, diarrhea, nausea and vomiting.  Endocrine: Positive for polydipsia and polyuria. Negative for polyphagia.  Genitourinary: Negative for dysuria, flank pain, hematuria and urgency.  Musculoskeletal: Negative for back pain, gait problem, myalgias and neck pain.  Skin: Negative for pallor, rash and wound.  Neurological: Negative for seizures, syncope, weakness, numbness and headaches.  Psychiatric/Behavioral: Negative.  Negative for confusion and dysphoric mood.    Objective:    BP 136/82   Pulse 70   Ht 5' 11"  (1.803 m)   Wt 262 lb (118.8 kg)   BMI 36.54 kg/m   Wt Readings from Last 3 Encounters:  08/25/17 262 lb (118.8 kg)  07/22/17 256 lb (116.1 kg)  07/16/17 251 lb 12.3 oz (114.2 kg)    Physical Exam  Constitutional: He is oriented to person, place, and time. He appears  well-developed and well-nourished. He is cooperative. No distress.  HENT:  Head: Normocephalic and atraumatic.  Eyes: EOM are normal.  Neck: Normal range of motion. Neck supple. No tracheal deviation present. No thyromegaly present.  Cardiovascular: Normal rate, S1 normal, S2 normal and normal heart sounds. Exam reveals no gallop.  No murmur heard. Pulses:      Dorsalis pedis pulses are 1+ on the right side, and 1+ on the left side.       Posterior tibial pulses are 1+ on the right side, and 1+ on the left side.  Pulmonary/Chest: Breath sounds normal. No respiratory distress. He has no wheezes.  Abdominal: Soft. Bowel sounds are normal. He exhibits no distension. There is no tenderness. There is no guarding and no CVA tenderness.  Musculoskeletal: He exhibits no edema.       Right shoulder: He exhibits no swelling and no deformity.  Neurological: He is alert and oriented to person, place, and time. He has normal strength and normal reflexes. No cranial nerve deficit or sensory deficit. Gait normal.  Skin: Skin is warm and dry. No rash noted. No cyanosis. Nails show no clubbing.  Psychiatric: He has a normal mood and affect. His speech is normal and behavior is normal. Judgment and thought content normal. Cognition and memory are normal.   CMP ( most recent) CMP     Component Value Date/Time   NA 137 07/16/2017 0340   NA 141 07/07/2017 0837   K 4.0 07/16/2017 0340   CL 107 07/16/2017 0340   CO2 24 07/16/2017 0340   GLUCOSE 157 (H) 07/16/2017 0340   BUN 14 07/16/2017 0340   BUN 15 07/07/2017 0837   CREATININE 0.91 07/16/2017 0340   CREATININE 0.97 07/11/2017 0836   CALCIUM 8.5 (L) 07/16/2017 0340   PROT 7.0 12/24/2016 1210   ALBUMIN 4.1 12/24/2016 1210   AST 29 12/24/2016 1210   ALT 37 12/24/2016 1210   ALKPHOS 114 12/24/2016 1210   BILITOT 0.7 12/24/2016 1210   GFRNONAA >60 07/16/2017 0340   GFRAA >60 07/16/2017 0340     Diabetic Labs (most recent): Lab Results  Component  Value Date   HGBA1C 14.7 (H) 05/10/2017   HGBA1C 11.8 11/07/2016   HGBA1C 11.1 (H) 08/31/2016     Lipid Panel ( most recent) Lipid Panel     Component Value Date/Time   CHOL 160 05/11/2017 0415   TRIG 1,145 (H) 05/11/2017 0415   HDL 19 (L) 05/11/2017 0415   CHOLHDL 8.4 05/11/2017 0415   VLDL UNABLE TO CALCULATE IF TRIGLYCERIDE OVER 400 mg/dL 05/11/2017 0415   LDLCALC UNABLE TO CALCULATE IF TRIGLYCERIDE OVER 400 mg/dL 05/11/2017 0415     Assessment & Plan:   1. Uncontrolled type 2 diabetes mellitus with chronic kidney disease, with long-term current use of insulin, unspecified CKD stage ([place])  - Patient has currently uncontrolled symptomatic type 2 DM  since  41 years of age. - He came with improved blood glucose profile average 132 over the last 30 days. He did not go to lab for A1c before this visit. His initial A1c was extremely high at 40.7%.  Recent labs reviewed, showing normal renal function.  -his diabetes is complicated by obesity/sedentary life, chronic heavy smoking and MICAEL BARB remains at a high risk for more acute and chronic complications which include CAD, CVA, CKD, retinopathy, and neuropathy. These are all discussed in detail with the patient.  - I have counseled him on diet management and weight loss, by adopting a carbohydrate restricted/protein rich diet.  -  Suggestion is made for him to avoid simple carbohydrates  from his diet including Cakes, Sweet Desserts / Pastries, Ice Cream, Soda (diet and regular), Sweet Tea, Candies, Chips, Cookies, Store Bought Juices, Alcohol in Excess of  1-2 drinks a day, Artificial Sweeteners, and "Sugar-free" Products. This will help patient to have stable blood glucose profile and potentially avoid unintended weight gain.  - I encouraged him to switch to  unprocessed or minimally processed complex starch and increased protein intake (animal or plant source), fruits, and vegetables.  - he is advised to stick to a routine  mealtimes to eat 3 meals  a day and avoid unnecessary snacks ( to snack only to correct hypoglycemia).  - His consult with  Jearld Fenton, CDE is pending for individualized diabetes education.  - I have approached him with the following individualized plan to manage diabetes and patient agrees:   - I  will continue with his basal insulin Levemir 60 units daily at bedtime , and  decrease prandial insulin Humalog to 5 units 3 times a day with meals  for pre-meal blood glucose readings of 90-163m/dl, plus patient specific correction dose for unexpected hyperglycemia above 1588mdl, associated with strict monitoring of glucose 4 times a day-before meals and at bedtime. - Patient is warned not to take insulin without proper monitoring per orders. -Adjustment parameters are given for hypo and hyperglycemia in writing. -Patient is encouraged to call clinic for blood glucose levels less than 70 or above 200 mg /dl. - He has tolerated metformin, I advised him to increase to 1000 g by mouth twice a day.  -  I will proceed to discontinue Jardiance .  - he will be considered for incretin therapy if hypertriglyceridemia improves by  next visit, to give him a chance to come off of prandial insulin. - Patient specific target  A1c;  LDL, HDL, Triglycerides, and  Waist Circumference were discussed in detail.  2) BP/HTN: controlled. I advised him to continue  current medications including ACEI/ARB. 3) Lipidsuncontrolled with hypertriglyceridemia of 380.   Patient is advised to continue statins. 4)  Weight/Diet: CDE Consult has been initiated -  consult pending , exercise, and detailed carbohydrates information provided.  5) Chronic Care/Health Maintenance:  -he  is on ACEI/ARB and Statin medications and  is encouraged to continue to follow up with Ophthalmology, Dentist,  Podiatrist at least yearly or according to recommendations, and advised to  quit smoking. I have recommended yearly flu vaccine and pneumonia  vaccination at least every 5 years; moderate intensity exercise for up to 150 minutes weekly; and  sleep for at least 7 hours a day.  - I advised patient to maintain close follow up with [facility] for primary care needs. - Time spent with the patient: 25 min, of which >50% was spent in reviewing his sugar  logs , discussing his hypo- and hyper-glycemic episodes, reviewing his current and  previous labs and insulin doses and developing a plan to avoid hypo- and hyper-glycemia.   Follow up plan: - Return in about 8 weeks (around 10/20/2017) for follow up with pre-visit labs, meter, and logs.  Glade Lloyd, MD Phone: [phone]  Fax: [phone]   08/25/2017, 12:41 PM

## 2017-09-01 DIAGNOSIS — E0865 Diabetes mellitus due to underlying condition with hyperglycemia: Secondary | ICD-10-CM | POA: Diagnosis not present

## 2017-09-01 DIAGNOSIS — E782 Mixed hyperlipidemia: Secondary | ICD-10-CM | POA: Diagnosis not present

## 2017-09-01 DIAGNOSIS — E7800 Pure hypercholesterolemia, unspecified: Secondary | ICD-10-CM | POA: Diagnosis not present

## 2017-09-04 ENCOUNTER — Emergency Department (HOSPITAL_COMMUNITY): Payer: 59

## 2017-09-04 ENCOUNTER — Other Ambulatory Visit: Payer: Self-pay

## 2017-09-04 ENCOUNTER — Encounter (HOSPITAL_COMMUNITY): Payer: Self-pay | Admitting: *Deleted

## 2017-09-04 ENCOUNTER — Emergency Department (HOSPITAL_COMMUNITY)
Admission: EM | Admit: 2017-09-04 | Discharge: 2017-09-04 | Disposition: A | Discharge status: Home / Self Care | Payer: 59 | Attending: Emergency Medicine | Admitting: Emergency Medicine

## 2017-09-04 ENCOUNTER — Observation Stay (HOSPITAL_COMMUNITY)
Admission: EM | Admit: 2017-09-04 | Discharge: 2017-09-07 | DRG: 373 | Disposition: A | Discharge status: Home / Self Care | Payer: 59 | Attending: General Surgery | Admitting: General Surgery

## 2017-09-04 ENCOUNTER — Encounter (HOSPITAL_COMMUNITY): Payer: Self-pay

## 2017-09-04 DIAGNOSIS — Z5321 Procedure and treatment not carried out due to patient leaving prior to being seen by health care provider: Secondary | ICD-10-CM | POA: Insufficient documentation

## 2017-09-04 DIAGNOSIS — E119 Type 2 diabetes mellitus without complications: Secondary | ICD-10-CM | POA: Diagnosis not present

## 2017-09-04 DIAGNOSIS — I2510 Atherosclerotic heart disease of native coronary artery without angina pectoris: Secondary | ICD-10-CM | POA: Diagnosis present

## 2017-09-04 DIAGNOSIS — Z833 Family history of diabetes mellitus: Secondary | ICD-10-CM | POA: Diagnosis not present

## 2017-09-04 DIAGNOSIS — R1010 Upper abdominal pain, unspecified: Secondary | ICD-10-CM | POA: Insufficient documentation

## 2017-09-04 DIAGNOSIS — E669 Obesity, unspecified: Secondary | ICD-10-CM | POA: Diagnosis not present

## 2017-09-04 DIAGNOSIS — E785 Hyperlipidemia, unspecified: Secondary | ICD-10-CM | POA: Diagnosis present

## 2017-09-04 DIAGNOSIS — R011 Cardiac murmur, unspecified: Secondary | ICD-10-CM | POA: Diagnosis not present

## 2017-09-04 DIAGNOSIS — K219 Gastro-esophageal reflux disease without esophagitis: Secondary | ICD-10-CM | POA: Diagnosis not present

## 2017-09-04 DIAGNOSIS — Z955 Presence of coronary angioplasty implant and graft: Secondary | ICD-10-CM | POA: Diagnosis not present

## 2017-09-04 DIAGNOSIS — I519 Heart disease, unspecified: Secondary | ICD-10-CM

## 2017-09-04 DIAGNOSIS — Z87442 Personal history of urinary calculi: Secondary | ICD-10-CM | POA: Diagnosis not present

## 2017-09-04 DIAGNOSIS — Z8249 Family history of ischemic heart disease and other diseases of the circulatory system: Secondary | ICD-10-CM

## 2017-09-04 DIAGNOSIS — Z7902 Long term (current) use of antithrombotics/antiplatelets: Secondary | ICD-10-CM | POA: Diagnosis not present

## 2017-09-04 DIAGNOSIS — F17210 Nicotine dependence, cigarettes, uncomplicated: Secondary | ICD-10-CM | POA: Diagnosis present

## 2017-09-04 DIAGNOSIS — Z880 Allergy status to penicillin: Secondary | ICD-10-CM

## 2017-09-04 DIAGNOSIS — K3589 Other acute appendicitis without perforation or gangrene: Secondary | ICD-10-CM | POA: Diagnosis not present

## 2017-09-04 DIAGNOSIS — K36 Other appendicitis: Secondary | ICD-10-CM | POA: Diagnosis present

## 2017-09-04 DIAGNOSIS — Z7982 Long term (current) use of aspirin: Secondary | ICD-10-CM | POA: Diagnosis not present

## 2017-09-04 DIAGNOSIS — Z6836 Body mass index (BMI) 36.0-36.9, adult: Secondary | ICD-10-CM

## 2017-09-04 DIAGNOSIS — Z91030 Bee allergy status: Secondary | ICD-10-CM | POA: Diagnosis not present

## 2017-09-04 DIAGNOSIS — R109 Unspecified abdominal pain: Secondary | ICD-10-CM | POA: Diagnosis not present

## 2017-09-04 DIAGNOSIS — I10 Essential (primary) hypertension: Secondary | ICD-10-CM | POA: Diagnosis present

## 2017-09-04 DIAGNOSIS — Z79899 Other long term (current) drug therapy: Secondary | ICD-10-CM

## 2017-09-04 DIAGNOSIS — R079 Chest pain, unspecified: Secondary | ICD-10-CM | POA: Diagnosis not present

## 2017-09-04 DIAGNOSIS — Z794 Long term (current) use of insulin: Secondary | ICD-10-CM

## 2017-09-04 LAB — URINALYSIS, ROUTINE W REFLEX MICROSCOPIC
Bilirubin Urine: NEGATIVE
GLUCOSE, UA: NEGATIVE mg/dL
HGB URINE DIPSTICK: NEGATIVE
Ketones, ur: 5 mg/dL — AB
Leukocytes, UA: NEGATIVE
Nitrite: NEGATIVE
Protein, ur: NEGATIVE mg/dL
SPECIFIC GRAVITY, URINE: 1.021 (ref 1.005–1.030)
pH: 5 (ref 5.0–8.0)

## 2017-09-04 LAB — COMPREHENSIVE METABOLIC PANEL WITH GFR
ALT: 38 U/L (ref 17–63)
AST: 25 U/L (ref 15–41)
Albumin: 4.1 g/dL (ref 3.5–5.0)
Alkaline Phosphatase: 81 U/L (ref 38–126)
Anion gap: 7 (ref 5–15)
BUN: 14 mg/dL (ref 6–20)
CO2: 28 mmol/L (ref 22–32)
Calcium: 9.5 mg/dL (ref 8.9–10.3)
Chloride: 103 mmol/L (ref 101–111)
Creatinine, Ser: 0.99 mg/dL (ref 0.61–1.24)
GFR calc Af Amer: >60 mL/min
GFR calc non Af Amer: >60 mL/min
Glucose, Bld: 120 mg/dL — ABNORMAL HIGH (ref 65–99)
Potassium: 4.1 mmol/L (ref 3.5–5.1)
Sodium: 138 mmol/L (ref 135–145)
Total Bilirubin: 0.9 mg/dL (ref 0.3–1.2)
Total Protein: 7 g/dL (ref 6.5–8.1)

## 2017-09-04 LAB — CBC
HCT: 47.8 % (ref 39.0–52.0)
Hemoglobin: 16 g/dL (ref 13.0–17.0)
MCH: 30.3 pg (ref 26.0–34.0)
MCHC: 33.5 g/dL (ref 30.0–36.0)
MCV: 90.5 fL (ref 78.0–100.0)
Platelets: 217 K/uL (ref 150–400)
RBC: 5.28 MIL/uL (ref 4.22–5.81)
RDW: 13.9 % (ref 11.5–15.5)
WBC: 19.4 K/uL — ABNORMAL HIGH (ref 4.0–10.5)

## 2017-09-04 LAB — LIPASE, BLOOD: Lipase: 22 U/L (ref 11–51)

## 2017-09-04 LAB — I-STAT TROPONIN, ED: Troponin i, poc: 0 ng/mL (ref 0.00–0.08)

## 2017-09-04 MED ORDER — IOPAMIDOL (ISOVUE-300) INJECTION 61%
100.0000 mL | Freq: Once | INTRAVENOUS | Status: AC | PRN
  Administered 2017-09-04: 100 mL via INTRAVENOUS

## 2017-09-04 MED ORDER — MORPHINE SULFATE (PF) 4 MG/ML IV SOLN
4.0000 mg | Freq: Once | INTRAVENOUS | Status: AC
  Administered 2017-09-04: 4 mg via INTRAVENOUS
  Filled 2017-09-04: qty 1

## 2017-09-04 MED ORDER — ONDANSETRON HCL 4 MG/2ML IJ SOLN
4.0000 mg | Freq: Once | INTRAMUSCULAR | Status: AC
  Administered 2017-09-04: 4 mg via INTRAVENOUS
  Filled 2017-09-04: qty 2

## 2017-09-04 NOTE — ED Triage Notes (Signed)
Pt reports onset this am of mid upper abd pain and right side pain. Had nausea, no vomiting and no sob. ekg done and no acute distress is noted at triage.

## 2017-09-04 NOTE — ED Provider Notes (Signed)
[facility] EMERGENCY DEPARTMENT Provider Note   CSN: [id number] Arrival date & time: 09/04/17  2024     History   Chief Complaint Chief Complaint  Patient presents with  . Chest Pain    HPI Lance [name] is a 41 y.o. male with a history of CAD, hypertension, GERD, history of kidney stones, SVT and type 2 diabetes presenting with abdominal pain which started this morning while at work.  He describes right upper abdominal pain initially, which has migrated to his right lower quadrant during the course of today.  He describes nausea without emesis, denies fevers, chills or anorexia, reporting last meal at noon today.  He describes pain that radiates into his chest which is worsened with certain positions, movement and deep palpation.  He also describes 2 diarrheal stools earlier today which did not improve or worsen his symptoms.  He has had no shortness of breath or palpitations.  He has had no medications prior to arrival.  Patient went to [facility], but left after blood work was drawn as the wait time was extended.  His labs and review from: Were significant for a white blood cell count of 19,000.   The history is provided by the patient and the spouse.    Past Medical History:  Diagnosis Date  . CAD (coronary artery disease), native coronary artery    10/18 PCI/DES to mRCA, and OM, with total occlusion of dLCx with collaterals.   . Essential hypertension   . GERD (gastroesophageal reflux disease)   . Heart murmur   . History of kidney stones   . History of nuclear stress test 08/2016   low risk study  . Hyperlipemia   . Irregular heart beat   . SVT (supraventricular tachycardia) (HCC) 2015   hx converted w/adenosine in the ER /notes 07/14/2017  . Type 2 diabetes mellitus [facility])     Patient Active Problem List   Diagnosis Date Noted  . Uncontrolled type 2 diabetes mellitus with hyperglycemia (HCC) 08/25/2017  . CAD (coronary artery disease) 07/15/2017  . Class 1  obesity due to excess calories with serious comorbidity and body mass index (BMI) of 34.0 to 34.9 in adult 05/20/2017  . Leukocytosis 05/10/2017  . Family history of early CAD 12/29/2016  . Type 2 diabetes mellitus, uncontrolled (HCC) 08/31/2016  . Mixed hyperlipidemia 08/31/2016  . Current smoker 08/31/2016  . Chest pain 08/31/2016  . Abdominal pain, epigastric 04/21/2013  . Essential hypertension, benign 04/21/2013    Past Surgical History:  Procedure Laterality Date  . CORONARY ANGIOPLASTY WITH STENT PLACEMENT  07/15/2017   "2 stents"  . LAPAROSCOPIC CHOLECYSTECTOMY    . LEFT HEART CATH AND CORONARY ANGIOGRAPHY N/A 07/15/2017   Procedure: LEFT HEART CATH AND CORONARY ANGIOGRAPHY;  Surgeon: Tonny Bollman, MD;  Location: [facility] INVASIVE CV LAB;  Service: Cardiovascular;  Laterality: N/A;       Home Medications    Prior to Admission medications   Medication Sig Start Date End Date Taking? Authorizing Provider  ALPRAZolam Prudy Feeler) 1 MG tablet Take 1 mg by mouth daily as needed for anxiety.   Yes [provider]  aspirin EC 81 MG tablet Take 81 mg by mouth daily.   Yes [provider]  atorvastatin (LIPITOR) 80 MG tablet Take 1 tablet (80 mg total) by mouth daily. 09/01/16  Yes Standley Brooking, MD  carvedilol (COREG) 6.25 MG tablet Take 1 tablet (6.25 mg total) by mouth 2 (two) times daily. Patient taking differently: Take  3.125 mg by mouth 2 (two) times daily.  07/22/17  Yes Dyann Kief, PA-C  clopidogrel (PLAVIX) 75 MG tablet Take 1 tablet (75 mg total) by mouth daily with breakfast. 07/16/17  Yes Laverda Page B, NP  ezetimibe (ZETIA) 10 MG tablet Take 1 tablet (10 mg total) by mouth daily. 05/13/17  Yes Rodolph Bong, MD  Insulin Detemir (LEVEMIR FLEXPEN) 100 UNIT/ML Pen Inject 60 Units into the skin daily at 10 pm. 05/20/17  Yes Nida, Denman George, MD  insulin lispro (HUMALOG KWIKPEN) 100 UNIT/ML KiwkPen Inject 10-16 Units into the skin 3 (three)  times daily before meals.   Yes [provider]  lisinopril (PRINIVIL,ZESTRIL) 40 MG tablet Take 40 mg by mouth daily.   Yes [provider]  metFORMIN (GLUCOPHAGE) 500 MG tablet Take 1,000 mg by mouth 2 (two) times daily with a meal.    Yes [provider]  nitroGLYCERIN (NITROSTAT) 0.4 MG SL tablet Place 1 tablet (0.4 mg total) under the tongue every 5 (five) minutes x 3 doses as needed for chest pain. 05/12/17  Yes Rodolph Bong, MD    Family History Family History  Problem Relation Age of Onset  . Heart disease Mother 85       CABG  . Diabetes Mother   . Gout Mother   . Heart attack Father 92    Social History Social History   Tobacco Use  . Smoking status: Current Some Day Smoker    Packs/day: 0.25    Years: 26.00    Pack years: 6.50    Types: Cigarettes  . Smokeless tobacco: Never Used  Substance Use Topics  . Alcohol use: No  . Drug use: No     Allergies   Bee venom and Penicillins   Review of Systems Review of Systems  Constitutional: Negative for fever.  HENT: Negative for congestion and sore throat.   Eyes: Negative.   Respiratory: Negative for chest tightness and shortness of breath.   Cardiovascular: Negative for chest pain.  Gastrointestinal: Positive for abdominal pain, diarrhea and nausea. Negative for vomiting.  Genitourinary: Negative.   Musculoskeletal: Negative for arthralgias, joint swelling and neck pain.  Skin: Negative.  Negative for rash and wound.  Neurological: Negative for dizziness, weakness, light-headedness, numbness and headaches.  Psychiatric/Behavioral: Negative.      Physical Exam Updated Vital Signs BP (!) 121/54   Pulse 78   Temp 98.9 F (37.2 C) (Oral)   Resp 17   Ht 5\' 11"  (1.803 m)   Wt 119.7 kg (264 lb)   SpO2 95%   BMI 36.82 kg/m   Physical Exam  Constitutional: He appears well-developed and well-nourished.  HENT:  Head: Normocephalic and atraumatic.  Eyes: Conjunctivae are  normal.  Neck: Normal range of motion.  Cardiovascular: Normal rate, regular rhythm, normal heart sounds and intact distal pulses.  Pulmonary/Chest: Effort normal and breath sounds normal. He has no wheezes.  Abdominal: Soft. Bowel sounds are normal. He exhibits no distension. There is tenderness in the right lower quadrant. There is guarding.  Musculoskeletal: Normal range of motion.  Neurological: He is alert.  Skin: Skin is warm and dry.  Psychiatric: He has a normal mood and affect.  Nursing note and vitals reviewed.    ED Treatments / Results  Labs (all labs ordered are listed, but only abnormal results are displayed) Results for orders placed or performed during the hospital encounter of 09/04/17  Urinalysis, Routine w reflex microscopic  Result Value Ref  Range   Color, Urine YELLOW YELLOW   APPearance CLEAR CLEAR   Specific Gravity, Urine 1.021 1.005 - 1.030   pH 5.0 5.0 - 8.0   Glucose, UA NEGATIVE NEGATIVE mg/dL   Hgb urine dipstick NEGATIVE NEGATIVE   Bilirubin Urine NEGATIVE NEGATIVE   Ketones, ur 5 (A) NEGATIVE mg/dL   Protein, ur NEGATIVE NEGATIVE mg/dL   Nitrite NEGATIVE NEGATIVE   Leukocytes, UA NEGATIVE NEGATIVE  Troponin I  Result Value Ref Range   Troponin I <0.03 <0.03 ng/mL   Dg Chest 2 View  Result Date: 09/04/2017 CLINICAL DATA:  Chest pain. EXAM: CHEST  2 VIEW COMPARISON:  None. FINDINGS: The heart size and mediastinal contours are within normal limits. Both lungs are clear. The visualized skeletal structures are unremarkable. IMPRESSION: No active cardiopulmonary disease. Electronically Signed   By: Gerome Samavid  Williams III M.D   On: 09/04/2017 20:56   Ct Abdomen Pelvis W Contrast  Result Date: 09/05/2017 CLINICAL DATA:  Right-sided abdominal pain. EXAM: CT ABDOMEN AND PELVIS WITH CONTRAST TECHNIQUE: Multidetector CT imaging of the abdomen and pelvis was performed using the standard protocol following bolus administration of intravenous contrast. CONTRAST:   100mL ISOVUE-300 IOPAMIDOL (ISOVUE-300) INJECTION 61% COMPARISON:  CT 05/26/2014 FINDINGS: Lower chest: Hypoventilatory atelectasis. 5 mm subpleural nodules in the left lower lobe are unchanged since 2015 and considered benign. Mild cardiomegaly. Coronary artery stents versus calcifications. Hepatobiliary: No focal hepatic lesion. Clips in the gallbladder fossa postcholecystectomy. No biliary dilatation. Pancreas: No ductal dilatation or inflammation. Spleen: Normal in size without focal abnormality. Adrenals/Urinary Tract: Normal adrenal glands. No hydronephrosis or perinephric edema. Homogeneous renal enhancement with symmetric excretion on delayed phase imaging. Urinary bladder is nondistended. Stomach/Bowel: The appendix is dilated measuring 12 mm with periappendiceal soft tissue stranding, details of below. Stomach is nondistended. No small or large bowel wall thickening, obstruction or inflammatory change. Appendix: Location: Retrocecal Diameter: 12 mm Appendicolith: No Mucosal hyper-enhancement: Faint Extraluminal gas: No Periappendiceal collection: No Vascular/Lymphatic: Prominent periportal nodes are likely reactive. Mild aorta bi-iliac atherosclerosis. Prominent bilateral external iliac node measuring 12 mm on the left and 10 mm on the right are unchanged and likely reactive. Reproductive: Prostate is unremarkable. Other: No free air, free fluid, or intra-abdominal fluid collection. Minimal fat in the left inguinal canal. Tiny fat containing umbilical hernia. Musculoskeletal: There are no acute or suspicious osseous abnormalities. IMPRESSION: 1. Uncomplicated acute appendicitis. 2. Mild aortic atherosclerosis (ICD10-I70.0). Electronically Signed   By: Rubye OaksMelanie  Ehinger M.D.   On: 09/05/2017 00:19     EKG  EKG Interpretation  Date/Time:  Friday September 04 2017 20:41:42 EST Ventricular Rate:  91 PR Interval:  158 QRS Duration: 94 QT Interval:  356 QTC Calculation: 437 R Axis:   47 Text  Interpretation:  Normal sinus rhythm Normal ECG No significant change was found Confirmed by Glynn Octaveancour, Stephen [phone]([id number]) on 09/04/2017 10:41:55 PM       Radiology Dg Chest 2 View  Result Date: 09/04/2017 CLINICAL DATA:  Chest pain. EXAM: CHEST  2 VIEW COMPARISON:  None. FINDINGS: The heart size and mediastinal contours are within normal limits. Both lungs are clear. The visualized skeletal structures are unremarkable. IMPRESSION: No active cardiopulmonary disease. Electronically Signed   By: Gerome Samavid  Williams III M.D   On: 09/04/2017 20:56   Ct Abdomen Pelvis W Contrast  Result Date: 09/05/2017 CLINICAL DATA:  Right-sided abdominal pain. EXAM: CT ABDOMEN AND PELVIS WITH CONTRAST TECHNIQUE: Multidetector CT imaging of the abdomen and pelvis was performed using the standard protocol  following bolus administration of intravenous contrast. CONTRAST:  100mL ISOVUE-300 IOPAMIDOL (ISOVUE-300) INJECTION 61% COMPARISON:  CT 05/26/2014 FINDINGS: Lower chest: Hypoventilatory atelectasis. 5 mm subpleural nodules in the left lower lobe are unchanged since 2015 and considered benign. Mild cardiomegaly. Coronary artery stents versus calcifications. Hepatobiliary: No focal hepatic lesion. Clips in the gallbladder fossa postcholecystectomy. No biliary dilatation. Pancreas: No ductal dilatation or inflammation. Spleen: Normal in size without focal abnormality. Adrenals/Urinary Tract: Normal adrenal glands. No hydronephrosis or perinephric edema. Homogeneous renal enhancement with symmetric excretion on delayed phase imaging. Urinary bladder is nondistended. Stomach/Bowel: The appendix is dilated measuring 12 mm with periappendiceal soft tissue stranding, details of below. Stomach is nondistended. No small or large bowel wall thickening, obstruction or inflammatory change. Appendix: Location: Retrocecal Diameter: 12 mm Appendicolith: No Mucosal hyper-enhancement: Faint Extraluminal gas: No Periappendiceal collection: No  Vascular/Lymphatic: Prominent periportal nodes are likely reactive. Mild aorta bi-iliac atherosclerosis. Prominent bilateral external iliac node measuring 12 mm on the left and 10 mm on the right are unchanged and likely reactive. Reproductive: Prostate is unremarkable. Other: No free air, free fluid, or intra-abdominal fluid collection. Minimal fat in the left inguinal canal. Tiny fat containing umbilical hernia. Musculoskeletal: There are no acute or suspicious osseous abnormalities. IMPRESSION: 1. Uncomplicated acute appendicitis. 2. Mild aortic atherosclerosis (ICD10-I70.0). Electronically Signed   By: Rubye OaksMelanie  Ehinger M.D.   On: 09/05/2017 00:19    Procedures Procedures (including critical care time)  Medications Ordered in ED Medications  ertapenem (INVANZ) 1 g in sodium chloride 0.9 % 50 mL IVPB (not administered)  morphine 4 MG/ML injection 4 mg (4 mg Intravenous Given 09/04/17 2304)  ondansetron (ZOFRAN) injection 4 mg (4 mg Intravenous Given 09/04/17 2304)  iopamidol (ISOVUE-300) 61 % injection 100 mL (100 mLs Intravenous Contrast Given 09/04/17 2331)     Initial Impression / Assessment and Plan / ED Course  I have reviewed the triage vital signs and the nursing notes.  Pertinent labs & imaging results that were available during my care of the patient were reviewed by me and considered in my medical decision making (see chart for details).     Patient with acute appendicitis per CT scan.  He is comfortable after receiving IV morphine.  Will discuss with general surgery for admission.  Discussed with Dr. Henreitta LeberBridges.  She has asked that we admit patient for overnight observation for IV antibiotics and to further assess risks/benefits of surgery versus waiting given current Plavix use and recently placed stents.  Ordered [facility] per Dr. Henreitta LeberBridges request.  She will plan to see patient first thing in the morning.  Final Clinical Impressions(s) / ED Diagnoses   Final diagnoses:  Other  acute appendicitis    ED Discharge Orders    None       Victoriano Laindol, Lemar Bakos, PA-C 09/05/17 0140    Samuel JesterMcManus, Kathleen, DO 09/07/17 [id number]

## 2017-09-04 NOTE — ED Notes (Signed)
Patient transported to CT 

## 2017-09-04 NOTE — ED Notes (Signed)
Pt informed EMT that [name]

## 2017-09-04 NOTE — ED Triage Notes (Signed)
Pt states he checked in to [facility] And had ekg and blood work done there. Pt states he left there to come here due to being told it would be 7 more hours wait.   Pt has been complaining of chest pain and right side pain that started approx 9am today.  Pt states pain is worse with movement.

## 2017-09-04 NOTE — ED Notes (Signed)
Pt states right sided flank pain, rated at a 7 (0-10 scale), radiates to cause his chest pain. Upon assessment, tender mass felt under skin on RLQ of right flank.

## 2017-09-05 ENCOUNTER — Encounter (HOSPITAL_COMMUNITY): Payer: Self-pay

## 2017-09-05 ENCOUNTER — Other Ambulatory Visit: Payer: Self-pay

## 2017-09-05 DIAGNOSIS — K3580 Unspecified acute appendicitis: Secondary | ICD-10-CM

## 2017-09-05 DIAGNOSIS — Z955 Presence of coronary angioplasty implant and graft: Secondary | ICD-10-CM | POA: Diagnosis not present

## 2017-09-05 DIAGNOSIS — I519 Heart disease, unspecified: Secondary | ICD-10-CM | POA: Diagnosis not present

## 2017-09-05 DIAGNOSIS — K36 Other appendicitis: Secondary | ICD-10-CM | POA: Diagnosis present

## 2017-09-05 LAB — CBC WITH DIFFERENTIAL/PLATELET
Basophils Absolute: 0 10*3/uL (ref 0.0–0.1)
Basophils Relative: 0 %
EOS PCT: 3 %
Eosinophils Absolute: 0.4 10*3/uL (ref 0.0–0.7)
HEMATOCRIT: 46.1 % (ref 39.0–52.0)
Hemoglobin: 14.9 g/dL (ref 13.0–17.0)
LYMPHS ABS: 3.1 10*3/uL (ref 0.7–4.0)
LYMPHS PCT: 21 %
MCH: 29.4 pg (ref 26.0–34.0)
MCHC: 32.3 g/dL (ref 30.0–36.0)
MCV: 91.1 fL (ref 78.0–100.0)
MONO ABS: 1.3 10*3/uL — AB (ref 0.1–1.0)
MONOS PCT: 9 %
NEUTROS ABS: 9.7 10*3/uL — AB (ref 1.7–7.7)
Neutrophils Relative %: 67 %
PLATELETS: 195 10*3/uL (ref 150–400)
RBC: 5.06 MIL/uL (ref 4.22–5.81)
RDW: 13.7 % (ref 11.5–15.5)
WBC: 14.5 10*3/uL — ABNORMAL HIGH (ref 4.0–10.5)

## 2017-09-05 LAB — BASIC METABOLIC PANEL
ANION GAP: 8 (ref 5–15)
BUN: 16 mg/dL (ref 6–20)
CO2: 28 mmol/L (ref 22–32)
Calcium: 8.9 mg/dL (ref 8.9–10.3)
Chloride: 100 mmol/L — ABNORMAL LOW (ref 101–111)
Creatinine, Ser: 0.93 mg/dL (ref 0.61–1.24)
GFR calc Af Amer: >60 mL/min (ref >60)
GFR calc non Af Amer: >60 mL/min (ref >60)
GLUCOSE: 136 mg/dL — AB (ref 65–99)
POTASSIUM: 3.7 mmol/L (ref 3.5–5.1)
Sodium: 136 mmol/L (ref 135–145)

## 2017-09-05 LAB — TROPONIN I

## 2017-09-05 LAB — GLUCOSE, CAPILLARY
GLUCOSE-CAPILLARY: 156 mg/dL — AB (ref 65–99)
GLUCOSE-CAPILLARY: 120 mg/dL — AB (ref 65–99)
Glucose-Capillary: 125 mg/dL — ABNORMAL HIGH (ref 65–99)
Glucose-Capillary: 131 mg/dL — ABNORMAL HIGH (ref 65–99)
Glucose-Capillary: 111 mg/dL — ABNORMAL HIGH (ref 65–99)

## 2017-09-05 MED ORDER — EZETIMIBE 10 MG PO TABS
10.0000 mg | ORAL_TABLET | Freq: Every day | ORAL | Status: DC
  Administered 2017-09-05 – 2017-09-06 (×2): 10 mg via ORAL
  Filled 2017-09-05 (×2): qty 1

## 2017-09-05 MED ORDER — DIPHENHYDRAMINE HCL 12.5 MG/5ML PO ELIX: 12.5000 mg | ORAL_SOLUTION | Freq: Four times a day (QID) | ORAL | Status: DC | PRN

## 2017-09-05 MED ORDER — CLOPIDOGREL BISULFATE 75 MG PO TABS
75.0000 mg | ORAL_TABLET | Freq: Every day | ORAL | Status: DC
  Administered 2017-09-05 – 2017-09-06 (×2): 75 mg via ORAL
  Filled 2017-09-05 (×2): qty 1

## 2017-09-05 MED ORDER — DIPHENHYDRAMINE HCL 50 MG/ML IJ SOLN: 12.5000 mg | Freq: Four times a day (QID) | INTRAMUSCULAR | Status: DC | PRN

## 2017-09-05 MED ORDER — ENOXAPARIN SODIUM 40 MG/0.4ML ~~LOC~~ SOLN
40.0000 mg | SUBCUTANEOUS | Status: DC
  Filled 2017-09-05 (×2): qty 0.4

## 2017-09-05 MED ORDER — OXYCODONE HCL 5 MG PO TABS: 5.0000 mg | ORAL_TABLET | ORAL | Status: DC | PRN

## 2017-09-05 MED ORDER — INSULIN ASPART 100 UNIT/ML ~~LOC~~ SOLN
0.0000 [IU] | SUBCUTANEOUS | Status: DC
  Administered 2017-09-05: 2 [IU] via SUBCUTANEOUS
  Administered 2017-09-05: 3 [IU] via SUBCUTANEOUS
  Administered 2017-09-05 – 2017-09-06 (×2): 2 [IU] via SUBCUTANEOUS

## 2017-09-05 MED ORDER — CARVEDILOL 3.125 MG PO TABS
3.1250 mg | ORAL_TABLET | Freq: Two times a day (BID) | ORAL | Status: DC
  Administered 2017-09-05 – 2017-09-06 (×4): 3.125 mg via ORAL
  Filled 2017-09-05 (×5): qty 1

## 2017-09-05 MED ORDER — ONDANSETRON HCL 4 MG/2ML IJ SOLN: 4.0000 mg | Freq: Four times a day (QID) | INTRAMUSCULAR | Status: DC | PRN

## 2017-09-05 MED ORDER — NITROGLYCERIN 0.4 MG SL SUBL: 0.4000 mg | SUBLINGUAL_TABLET | SUBLINGUAL | Status: DC | PRN

## 2017-09-05 MED ORDER — ONDANSETRON 4 MG PO TBDP: 4.0000 mg | ORAL_TABLET | Freq: Four times a day (QID) | ORAL | Status: DC | PRN

## 2017-09-05 MED ORDER — LACTATED RINGERS IV SOLN
INTRAVENOUS | Status: DC
  Administered 2017-09-05 – 2017-09-06 (×4): via INTRAVENOUS

## 2017-09-05 MED ORDER — MORPHINE SULFATE (PF) 2 MG/ML IV SOLN: 2.0000 mg | INTRAVENOUS | Status: DC | PRN

## 2017-09-05 MED ORDER — ERTAPENEM SODIUM 1 G IJ SOLR
1.0000 g | INTRAMUSCULAR | Status: DC
  Administered 2017-09-05 – 2017-09-06 (×2): 1 g via INTRAVENOUS
  Filled 2017-09-05 (×2): qty 1

## 2017-09-05 MED ORDER — ASPIRIN EC 81 MG PO TBEC
81.0000 mg | DELAYED_RELEASE_TABLET | Freq: Every day | ORAL | Status: DC
  Administered 2017-09-05 – 2017-09-06 (×2): 81 mg via ORAL
  Filled 2017-09-05 (×2): qty 1

## 2017-09-05 MED ORDER — ATORVASTATIN CALCIUM 40 MG PO TABS
80.0000 mg | ORAL_TABLET | Freq: Every day | ORAL | Status: DC
  Administered 2017-09-05 – 2017-09-06 (×2): 80 mg via ORAL
  Filled 2017-09-05: qty 2
  Filled 2017-09-05: qty 1
  Filled 2017-09-05: qty 2
  Filled 2017-09-05 (×2): qty 1

## 2017-09-05 MED ORDER — DOCUSATE SODIUM 100 MG PO CAPS
100.0000 mg | ORAL_CAPSULE | Freq: Two times a day (BID) | ORAL | Status: DC
  Administered 2017-09-05 – 2017-09-06 (×5): 100 mg via ORAL
  Filled 2017-09-05 (×5): qty 1

## 2017-09-05 MED ORDER — ERTAPENEM SODIUM 1 G IJ SOLR
1.0000 g | Freq: Once | INTRAMUSCULAR | Status: AC
  Administered 2017-09-05: 1 g via INTRAVENOUS
  Filled 2017-09-05: qty 1

## 2017-09-05 MED ORDER — SODIUM CHLORIDE 0.9 % IV SOLN: 1.0000 g | INTRAVENOUS | Status: DC

## 2017-09-05 NOTE — H&P (Addendum)
[facility] History and Physical  Reason for Referral: Acute Appendicitis  Referring Physician: Evalee Jefferson, PA (ED)  Chief Complaint: Right lower quadrant pain  Lance [name] is a 41 y.o. male.  HPI: Mr. Tanguma is a 41 yo male with acute appendicitis that underwent placement of two DES 07/15/17.  He presented to the ED with RLQ pain and nausea, and was found to have appendicitis . The pain started in the RLQ and radiated up into this chest.  He described it a worse with certain positions.  He had two episodes of diarrhea, and no vomiting.  He was brought into the hospital for IV antibiotics related to his appendicitis with plans for a discussion regarding operative management given the DES.  He is to remain on Plavix and aspirin uninterrupted per the cardiology documentation for 12 months.   He reported no fevers, chills or anorexia.  Since his stents were placed he denies any Chest pain or SOB but was having it regularly prior to the stents.   Past Medical History:  Diagnosis Date  . CAD (coronary artery disease), native coronary artery    10/18 PCI/DES to mRCA, and OM, with total occlusion of dLCx with collaterals.   . Essential hypertension   . GERD (gastroesophageal reflux disease)   . Heart murmur   . History of kidney stones   . History of nuclear stress test 08/2016   low risk study  . Hyperlipemia   . Irregular heart beat   . SVT (supraventricular tachycardia) ([place]) 2015   hx converted w/adenosine in the ER /notes 07/14/2017  . Type 2 diabetes mellitus ([place])     Past Surgical History:  Procedure Laterality Date  . CORONARY ANGIOPLASTY WITH STENT PLACEMENT  07/15/2017   "2 stents"  . LAPAROSCOPIC CHOLECYSTECTOMY    . LEFT HEART CATH AND CORONARY ANGIOGRAPHY N/A 07/15/2017   Procedure: LEFT HEART CATH AND CORONARY ANGIOGRAPHY;  Surgeon: Sherren Mocha, MD;  Location: [facility];  Service: Cardiovascular;  Laterality: N/A;    Family History   Problem Relation Age of Onset  . Heart disease Mother 73       CABG  . Diabetes Mother   . Gout Mother   . Heart attack Father 39    Social History   Tobacco Use  . Smoking status: Current Some Day Smoker    Packs/day: 0.25    Years: 26.00    Pack years: 6.50    Types: Cigarettes  . Smokeless tobacco: Never Used  Substance Use Topics  . Alcohol use: No  . Drug use: No    Medications:  I have reviewed the patient's current medications. Prior to Admission:  Medications Prior to Admission  Medication Sig Dispense Refill Last Dose  . ALPRAZolam (XANAX) 1 MG tablet Take 1 mg by mouth daily as needed for anxiety.   unknown  . aspirin EC 81 MG tablet Take 81 mg by mouth daily.   09/04/2017 at Unknown time  . atorvastatin (LIPITOR) 80 MG tablet Take 1 tablet (80 mg total) by mouth daily. 30 tablet 0 Past Week at Unknown time  . carvedilol (COREG) 6.25 MG tablet Take 1 tablet (6.25 mg total) by mouth 2 (two) times daily. (Patient taking differently: Take 3.125 mg by mouth 2 (two) times daily. ) 180 tablet 3 09/04/2017 at 0730  . clopidogrel (PLAVIX) 75 MG tablet Take 1 tablet (75 mg total) by mouth daily with breakfast. 90 tablet 2 09/04/2017 at 0730  .  ezetimibe (ZETIA) 10 MG tablet Take 1 tablet (10 mg total) by mouth daily. 30 tablet 0 09/04/2017 at Unknown time  . Insulin Detemir (LEVEMIR FLEXPEN) 100 UNIT/ML Pen Inject 60 Units into the skin daily at 10 pm. 5 pen 2 Past Week at Unknown time  . insulin lispro (HUMALOG KWIKPEN) 100 UNIT/ML KiwkPen Inject 10-16 Units into the skin 3 (three) times daily before meals.   09/04/2017 at Unknown time  . lisinopril (PRINIVIL,ZESTRIL) 40 MG tablet Take 40 mg by mouth daily.   09/04/2017 at Unknown time  . metFORMIN (GLUCOPHAGE) 500 MG tablet Take 1,000 mg by mouth 2 (two) times daily with a meal.    09/04/2017 at Unknown time  . nitroGLYCERIN (NITROSTAT) 0.4 MG SL tablet Place 1 tablet (0.4 mg total) under the tongue every 5 (five) minutes x  3 doses as needed for chest pain. 15 tablet 0 unknown   Scheduled: . aspirin EC  81 mg Oral Daily  . atorvastatin  80 mg Oral Daily  . carvedilol  3.125 mg Oral BID  . clopidogrel  75 mg Oral Q breakfast  . docusate sodium  100 mg Oral BID  . enoxaparin (LOVENOX) injection  40 mg Subcutaneous Q24H  . ezetimibe  10 mg Oral Daily  . insulin aspart  0-15 Units Subcutaneous Q4H   Continuous: . ertapenem    . lactated ringers 100 mL/hr at 09/05/17 0331   Allergies: Allergies  Allergen Reactions  . Bee Venom Swelling  . Penicillins Itching and Rash    Has patient had a PCN reaction causing immediate rash, facial/tongue/throat swelling, SOB or lightheadedness with hypotension: Yes Has patient had a PCN reaction causing severe rash involving mucus membranes or skin necrosis: No Has patient had a PCN reaction that required hospitalization: No Has patient had a PCN reaction occurring within the last 10 years: No If all of the above answers are "NO", then may proceed with Cephalosporin use.     ROS:  A comprehensive review of systems was negative except for: Gastrointestinal: positive for abdominal pain, diarrhea and nausea  Blood pressure 134/62, pulse 68, temperature 98.7 F (37.1 C), temperature source Oral, resp. rate 16, height _0  (1.803 m), weight 264 lb (119.7 kg), SpO2 96 %. Physical Exam  Constitutional: He is oriented to person, place, and time and well-developed, well-nourished, and in no distress.  HENT:  Head: Normocephalic.  Eyes: Pupils are equal, round, and reactive to light.  Neck: Normal range of motion.  Cardiovascular: Normal rate and regular rhythm.  Pulmonary/Chest: Effort normal and breath sounds normal.  Abdominal: Soft. He exhibits no distension. There is tenderness. There is no guarding.  Tender in the RLQ   Musculoskeletal: Normal range of motion.  Neurological: He is alert and oriented to person, place, and time.  Skin: Skin is warm and dry.   Psychiatric: Mood, memory, affect and judgment normal.  Vitals reviewed.   Results: Results for orders placed or performed during the hospital encounter of 09/04/17 (from the past 48 hour(s))  Urinalysis, Routine w reflex microscopic     Status: Abnormal   Collection Time: 09/04/17 11:10 PM  Result Value Ref Range   Color, Urine YELLOW YELLOW   APPearance CLEAR CLEAR   Specific Gravity, Urine 1.021 1.005 - 1.030   pH 5.0 5.0 - 8.0   Glucose, UA NEGATIVE NEGATIVE mg/dL   Hgb urine dipstick NEGATIVE NEGATIVE   Bilirubin Urine NEGATIVE NEGATIVE   Ketones, ur 5 (A) NEGATIVE mg/dL   Protein,  ur NEGATIVE NEGATIVE mg/dL   Nitrite NEGATIVE NEGATIVE   Leukocytes, UA NEGATIVE NEGATIVE  Troponin I     Status: None   Collection Time: 09/05/17 12:17 AM  Result Value Ref Range   Troponin I <0.03 <0.03 ng/mL  Glucose, capillary     Status: Abnormal   Collection Time: 09/05/17  2:57 AM  Result Value Ref Range   Glucose-Capillary 125 (H) 65 - 99 mg/dL   Comment 1 Notify RN    Comment 2 Document in Chart   Basic metabolic panel     Status: Abnormal   Collection Time: 09/05/17  6:07 AM  Result Value Ref Range   Sodium 136 135 - 145 mmol/L   Potassium 3.7 3.5 - 5.1 mmol/L   Chloride 100 (L) 101 - 111 mmol/L   CO2 28 22 - 32 mmol/L   Glucose, Bld 136 (H) 65 - 99 mg/dL   BUN 16 6 - 20 mg/dL   Creatinine, Ser 0.93 0.61 - 1.24 mg/dL   Calcium 8.9 8.9 - 10.3 mg/dL   GFR calc non Af Amer >60 >60 mL/min   GFR calc Af Amer >60 >60 mL/min    Comment: (NOTE) The eGFR has been calculated using the CKD EPI equation. This calculation has not been validated in all clinical situations. eGFR's persistently <60 mL/min signify possible Chronic Kidney Disease.    Anion gap 8 5 - 15  CBC WITH DIFFERENTIAL     Status: Abnormal   Collection Time: 09/05/17  6:07 AM  Result Value Ref Range   WBC 14.5 (H) 4.0 - 10.5 K/uL   RBC 5.06 4.22 - 5.81 MIL/uL   Hemoglobin 14.9 13.0 - 17.0 g/dL   HCT 46.1 39.0 -  52.0 %   MCV 91.1 78.0 - 100.0 fL   MCH 29.4 26.0 - 34.0 pg   MCHC 32.3 30.0 - 36.0 g/dL   RDW 13.7 11.5 - 15.5 %   Platelets 195 150 - 400 K/uL   Neutrophils Relative % 67 %   Neutro Abs 9.7 (H) 1.7 - 7.7 K/uL   Lymphocytes Relative 21 %   Lymphs Abs 3.1 0.7 - 4.0 K/uL   Monocytes Relative 9 %   Monocytes Absolute 1.3 (H) 0.1 - 1.0 K/uL   Eosinophils Relative 3 %   Eosinophils Absolute 0.4 0.0 - 0.7 K/uL   Basophils Relative 0 %   Basophils Absolute 0.0 0.0 - 0.1 K/uL  Glucose, capillary     Status: Abnormal   Collection Time: 09/05/17  7:40 AM  Result Value Ref Range   Glucose-Capillary 131 (H) 65 - 99 mg/dL   Comment 1 Notify RN    Comment 2 Document in Chart    Personally reviewed imaging- uncomplicated appendicitis with retrocecal appendix, no appendicolith appreciated  Dg Chest 2 View  Result Date: 09/04/2017 CLINICAL DATA:  Chest pain. EXAM: CHEST  2 VIEW COMPARISON:  None. FINDINGS: The heart size and mediastinal contours are within normal limits. Both lungs are clear. The visualized skeletal structures are unremarkable. IMPRESSION: No active cardiopulmonary disease. Electronically Signed   By: Dorise Bullion III M.D   On: 09/04/2017 20:56   Ct Abdomen Pelvis W Contrast  Result Date: 09/05/2017 CLINICAL DATA:  Right-sided abdominal pain. EXAM: CT ABDOMEN AND PELVIS WITH CONTRAST TECHNIQUE: Multidetector CT imaging of the abdomen and pelvis was performed using the standard protocol following bolus administration of intravenous contrast. CONTRAST:  128m ISOVUE-300 IOPAMIDOL (ISOVUE-300) INJECTION 61% COMPARISON:  CT 05/26/2014 FINDINGS: Lower chest: Hypoventilatory  atelectasis. 5 mm subpleural nodules in the left lower lobe are unchanged since 2015 and considered benign. Mild cardiomegaly. Coronary artery stents versus calcifications. Hepatobiliary: No focal hepatic lesion. Clips in the gallbladder fossa postcholecystectomy. No biliary dilatation. Pancreas: No ductal dilatation  or inflammation. Spleen: Normal in size without focal abnormality. Adrenals/Urinary Tract: Normal adrenal glands. No hydronephrosis or perinephric edema. Homogeneous renal enhancement with symmetric excretion on delayed phase imaging. Urinary bladder is nondistended. Stomach/Bowel: The appendix is dilated measuring 12 mm with periappendiceal soft tissue stranding, details of below. Stomach is nondistended. No small or large bowel wall thickening, obstruction or inflammatory change. Appendix: Location: Retrocecal Diameter: 12 mm Appendicolith: No Mucosal hyper-enhancement: Faint Extraluminal gas: No Periappendiceal collection: No Vascular/Lymphatic: Prominent periportal nodes are likely reactive. Mild aorta bi-iliac atherosclerosis. Prominent bilateral external iliac node measuring 12 mm on the left and 10 mm on the right are unchanged and likely reactive. Reproductive: Prostate is unremarkable. Other: No free air, free fluid, or intra-abdominal fluid collection. Minimal fat in the left inguinal canal. Tiny fat containing umbilical hernia. Musculoskeletal: There are no acute or suspicious osseous abnormalities. IMPRESSION: 1. Uncomplicated acute appendicitis. 2. Mild aortic atherosclerosis (ICD10-I70.0). Electronically Signed   By: Jeb Levering M.D.   On: 09/05/2017 00:19    Assessment & Plan:  CINCH ORMOND is a 41 y.o. male with uncomplicated appendicitis in the setting of recent DES placement X2 requiring uninterrupted aspirin and Plavix. Discussed with the patient and his wife the concern with an operation within 6 weeks of stent placement even if we operate on Plavix. Also discussed additional risk of bleeding and strain on heart that would occur if bleeding occurred. Discussed the nonoperative management is used more frequently in [place], and that the appendix is often never removed. Discussed that in the [place] the use of nonoperative management is typically as a bridge to removing the appendix in the  future given risk of recurrence and cancer.  He expressed understanding. At this time, I think that a trial of IV antibiotics to see if we can get him through this without surgery is the safest option.  I told him that he still may come to surgery if he does not improve with antibiotics.  -Clear diet -PRN For pain -Invanz ordered, plan for at least 3 days of IV and change to Po thereafter  -Home Plavix, aspirin ordered -Holding Insulin and giving SSI for now -Labs in AM, IVF, SCD, Lovenox   All questions were answered to the satisfaction of the patient and family.   Virl Cagey 09/05/2017, 11:25 AM

## 2017-09-05 NOTE — Progress Notes (Signed)
[facility]   In hospital and came to see patient but he is sleeping. Will wait until later in Am and complete H&P.   Algis GreenhouseLindsay Bridges, MD [facility] [address] [place], [place] [id number] [phone] (office)

## 2017-09-05 NOTE — Progress Notes (Signed)
[facility]  Patient with appendicitis and recent stent placement 10/10 on plavix.  Have admitted with antibiotics, and will see in the AM.  Will discuss the risk and benefits of operating so close to stent placement and plavix use.   Algis GreenhouseLindsay Beckam Abdulaziz, MD [facility] [address] [place], [place] [phone] (office)

## 2017-09-06 DIAGNOSIS — K3580 Unspecified acute appendicitis: Secondary | ICD-10-CM | POA: Diagnosis not present

## 2017-09-06 LAB — GLUCOSE, CAPILLARY
GLUCOSE-CAPILLARY: 107 mg/dL — AB (ref 65–99)
GLUCOSE-CAPILLARY: 139 mg/dL — AB (ref 65–99)
GLUCOSE-CAPILLARY: 100 mg/dL — AB (ref 65–99)
GLUCOSE-CAPILLARY: 139 mg/dL — AB (ref 65–99)
Glucose-Capillary: 107 mg/dL — ABNORMAL HIGH (ref 65–99)
Glucose-Capillary: 129 mg/dL — ABNORMAL HIGH (ref 65–99)

## 2017-09-06 LAB — CBC
HEMATOCRIT: 44 % (ref 39.0–52.0)
Hemoglobin: 14.4 g/dL (ref 13.0–17.0)
MCH: 29.1 pg (ref 26.0–34.0)
MCHC: 32.7 g/dL (ref 30.0–36.0)
MCV: 88.9 fL (ref 78.0–100.0)
PLATELETS: 175 10*3/uL (ref 150–400)
RBC: 4.95 MIL/uL (ref 4.22–5.81)
RDW: 13.4 % (ref 11.5–15.5)
WBC: 10 10*3/uL (ref 4.0–10.5)

## 2017-09-06 MED ORDER — INSULIN DETEMIR 100 UNIT/ML ~~LOC~~ SOLN
20.0000 [IU] | Freq: Every day | SUBCUTANEOUS | Status: DC
  Administered 2017-09-06: 20 [IU] via SUBCUTANEOUS
  Filled 2017-09-06: qty 0.2
  Filled 2017-09-06: qty 1

## 2017-09-06 MED ORDER — INSULIN ASPART 100 UNIT/ML ~~LOC~~ SOLN
0.0000 [IU] | Freq: Three times a day (TID) | SUBCUTANEOUS | Status: DC
  Administered 2017-09-06 (×2): 2 [IU] via SUBCUTANEOUS

## 2017-09-06 NOTE — Progress Notes (Addendum)
[facility] Progress Note     Subjective: No major issues. Having flatus and BM. Less pain. Tolerated full liquids. Wants to go outside to be in fresh air.   Objective: Vital signs in last 24 hours: Temp:  [97.7 F (36.5 C)-98.4 F (36.9 C)] 97.7 F (36.5 C) (12/02 0543) Pulse Rate:  [66-67] 66 (12/02 0543) Resp:  [16] 16 (12/02 0543) BP: (117-135)/(56-81) 135/70 (12/02 0543) SpO2:  [96 %-98 %] 96 % (12/02 0543) Last BM Date: 09/06/17  Intake/Output from previous day: 12/01 0701 - 12/02 0700 In: 2070 [I.V.:2020; IV Piggyback:50] Out: 500 [Urine:500] Intake/Output this shift: No intake/output data recorded.  General appearance: alert, cooperative and no distress Resp: normal work breathing GI: soft, nondistended, RLQ mildly tender, no rebound or guarding Extremities: extremities normal, atraumatic, no cyanosis or edema  Lab Results:  Recent Labs    09/05/17 0607 09/06/17 0606  WBC 14.5* 10.0  HGB 14.9 14.4  HCT 46.1 44.0  PLT 195 175   BMET Recent Labs    09/04/17 1641 09/05/17 0607  NA 138 136  K 4.1 3.7  CL 103 100*  CO2 28 28  GLUCOSE 120* 136*  BUN 14 16  CREATININE 0.99 0.93  CALCIUM 9.5 8.9    Anti-infectives: Anti-infectives (From admission, onward)   Start     Dose/Rate Route Frequency Ordered Stop   09/05/17 2200  ertapenem (INVANZ) 1 g in sodium chloride 0.9 % 50 mL IVPB     1 g 100 mL/hr over 30 Minutes Intravenous Every 24 hours 09/05/17 0813 09/08/17 2159   09/05/17 0815  ertapenem (INVANZ) 1 g in sodium chloride 0.9 % 50 mL IVPB  Status:  Discontinued     1 g 100 mL/hr over 30 Minutes Intravenous Every 24 hours 09/05/17 0813 09/05/17 0813   09/05/17 0100  ertapenem (INVANZ) 1 g in sodium chloride 0.9 % 50 mL IVPB     1 g 100 mL/hr over 30 Minutes Intravenous  Once 09/05/17 0053 09/05/17 [id number]     Assessment/Plan: [name] is a 41 yo with uncomplicated appendicitis in the setting of recent DES placement X2.  He is  improving on antibiotics. Doing well and pain improved. -Regular diet -Invanz again tonight -Plan for home tomorrow with oral augmentin -SSI and starting only 20U of his levemir and not the total 60  -SCDs, lovenox -Cardiac medications including plavix, aspirin  -Can ambulate outside per my opinion and got to courtyard, Lance MilletMegan RN reports that Lance [name] has rules against this so will have to defer to those rules    LOS: 1 day    Lance [name] 09/06/2017

## 2017-09-07 LAB — GLUCOSE, CAPILLARY: GLUCOSE-CAPILLARY: 121 mg/dL — AB (ref 65–99)

## 2017-09-07 MED ORDER — METRONIDAZOLE 250 MG PO TABS: 250.0000 mg | ORAL_TABLET | Freq: Three times a day (TID) | ORAL | 0 refills | Status: AC

## 2017-09-07 MED ORDER — CIPROFLOXACIN HCL 500 MG PO TABS: 500.0000 mg | ORAL_TABLET | Freq: Two times a day (BID) | ORAL | 0 refills | Status: AC

## 2017-09-07 MED ORDER — OXYCODONE HCL 5 MG PO TABS: 5.0000 mg | ORAL_TABLET | ORAL | 0 refills | Status: DC | PRN

## 2017-09-07 MED ORDER — DOCUSATE SODIUM 100 MG PO CAPS: 100.0000 mg | ORAL_CAPSULE | Freq: Two times a day (BID) | ORAL | 0 refills | Status: DC

## 2017-09-07 NOTE — Discharge Summary (Signed)
Physician Discharge Summary  Patient ID: Lance [name] MRN: [id number] DOB/AGE: 41/06/1976 41 y.o.  Admit date: 09/04/2017 Discharge date: 09/07/2017  Admission Diagnoses: Acute appendicitis   Discharge Diagnoses:  Principal Problem:   Appendicitis Active Problems:   Heart disease   S/P drug eluting coronary stent placement  Discharged Condition: good  Hospital Course: [name] is a [age] yo with recent DES placement about 6 weeks ago on Plavix and aspirin who came in to the hospital with acute appendicitis.  The patient was placed on IV antibiotics and had improvement in his pain and leukocytosis.  He was advanced to a regular diet which he tolerated and had BMs and flatus.   Consults: None  Significant Diagnostic Studies: Ct with acute appendicitis  Treatments: IV hydration and antibiotics: Invanz   Discharge Exam: Blood pressure 140/76, pulse 68, temperature (!) 97.5 F (36.4 C), temperature source Oral, resp. rate 18, height 5\' 11"  (1.803 m), weight 264 lb (119.7 kg), SpO2 97 %. General appearance: alert, cooperative and no distress Resp: normal work breathing GI: soft, non-tender; bowel sounds normal; no masses,  no organomegaly Extremities: extremities normal, atraumatic, no cyanosis or edema  Disposition: 07-Left Against Medical Advice/Left Without Being Seen/Elopement  Discharge Instructions    Call MD for:  difficulty breathing, headache or visual disturbances   Complete by:  As directed    Call MD for:  extreme fatigue   Complete by:  As directed    Call MD for:  persistant dizziness or light-headedness   Complete by:  As directed    Call MD for:  persistant nausea and vomiting   Complete by:  As directed    Call MD for:  severe uncontrolled pain   Complete by:  As directed    Call MD for:  temperature >100.4   Complete by:  As directed    Diet - low sodium heart healthy   Complete by:  As directed    Discharge instructions   Complete by:  As directed    You  are allergic to PCNs so Cipro/Flagyl was prescribed for your antibiotic.  Take tylenol or ibuprofen for pain control and Roxicodone as needed. Take Colace for constipation related to narcotics.  Continue your heart medications as prescribed.   Increase activity slowly   Complete by:  As directed      Allergies as of 09/07/2017      Reactions   Bee Venom Swelling   Penicillins Itching, Rash   Has patient had a PCN reaction causing immediate rash, facial/tongue/throat swelling, SOB or lightheadedness with hypotension: Yes Has patient had a PCN reaction causing severe rash involving mucus membranes or skin necrosis: No Has patient had a PCN reaction that required hospitalization: No Has patient had a PCN reaction occurring within the last 10 years: No If all of the above answers are "NO", then may proceed with Cephalosporin use.      Medication List    TAKE these medications   ALPRAZolam 1 MG tablet Commonly known as:  XANAX Take 1 mg by mouth daily as needed for anxiety.   aspirin EC 81 MG tablet Take 81 mg by mouth daily.   atorvastatin 80 MG tablet Commonly known as:  LIPITOR Take 1 tablet (80 mg total) by mouth daily.   carvedilol 6.25 MG tablet Commonly known as:  COREG Take 1 tablet (6.25 mg total) by mouth 2 (two) times daily. What changed:  how much to take   ciprofloxacin 500 MG tablet Commonly  known as:  CIPRO Take 1 tablet (500 mg total) by mouth 2 (two) times daily for 7 days.   clopidogrel 75 MG tablet Commonly known as:  PLAVIX Take 1 tablet (75 mg total) by mouth daily with breakfast.   docusate sodium 100 MG capsule Commonly known as:  COLACE Take 1 capsule (100 mg total) by mouth 2 (two) times daily.   ezetimibe 10 MG tablet Commonly known as:  ZETIA Take 1 tablet (10 mg total) by mouth daily.   HUMALOG KWIKPEN 100 UNIT/ML KiwkPen Generic drug:  insulin lispro Inject 10-16 Units into the skin 3 (three) times daily before meals.   Insulin Detemir  100 UNIT/ML Pen Commonly known as:  LEVEMIR FLEXPEN Inject 60 Units into the skin daily at 10 pm.   lisinopril 40 MG tablet Commonly known as:  PRINIVIL,ZESTRIL Take 40 mg by mouth daily.   metFORMIN 500 MG tablet Commonly known as:  GLUCOPHAGE Take 1,000 mg by mouth 2 (two) times daily with a meal.   metroNIDAZOLE 250 MG tablet Commonly known as:  FLAGYL Take 1 tablet (250 mg total) by mouth 3 (three) times daily for 7 days.   nitroGLYCERIN 0.4 MG SL tablet Commonly known as:  NITROSTAT Place 1 tablet (0.4 mg total) under the tongue every 5 (five) minutes x 3 doses as needed for chest pain.   oxyCODONE 5 MG immediate release tablet Commonly known as:  Oxy IR/ROXICODONE Take 1 tablet (5 mg total) by mouth every 4 (four) hours as needed for severe pain or breakthrough pain.      Follow-up Information    [name], [name], MD Follow up on 09/22/2017.   Specialty:  General Surgery Contact information: [address] [place] [address] [phone]           Signed: Lucretia RoersLindsay C Maribeth Jiles 09/07/2017, 7:46 AM

## 2017-09-07 NOTE — Progress Notes (Signed)
Pt's IV catheter removed and intact. Pt's IV site clean dry and intact. Discharge instructions including medications and follow up appointments were reviewed and discussed with patient. All questions were answered and no further questions at this time. Pt in stable condition and in no acute distress at time of discharge. Pt escorted by RN. 

## 2017-09-07 NOTE — Discharge Instructions (Signed)
Appendicitis °The appendix is a tube that is shaped like a finger. It is connected to the large intestine. Appendicitis means that this tube is swollen (inflamed). Without treatment, the tube can tear (rupture). This can lead to a life-threatening infection. It can also cause you to have sores (abscesses). These sores hurt. °What are the causes? °This condition may be caused by something that blocks the appendix, such as: °· A ball of poop (stool). °· Lymph glands that are bigger than normal. ° °Sometimes, the cause is not known. °What are the signs or symptoms? °Symptoms of this condition include: °· Pain around the belly button (navel). °? The pain moves toward the lower right belly (abdomen). °? The pain can get worse with time. °? The pain can get worse if you cough. °? The pain can get worse if you move suddenly. °· Tenderness in the lower right belly. °· Feeling sick to your stomach (nauseous). °· Throwing up (vomiting). °· Not feeling hungry (loss of appetite). °· A fever. °· Having a hard time pooping (constipation). °· Watery poop (diarrhea). °· Not feeling well. ° °How is this treated? °Usually, this condition is treated by taking out the appendix (appendectomy). There are two ways that the appendix can be taken out: °· Open surgery. In this surgery, the appendix is taken out through a large cut (incision). The cut is made in the lower right belly. This surgery may be picked if: °? You have scars from another surgery. °? You have a bleeding condition. °? You are pregnant and will be having your baby soon. °? You have a condition that does not allow the other type of surgery. °· Laparoscopic surgery. In this surgery, the appendix is taken out through small cuts. Often, this surgery: °? Causes less pain. °? Causes fewer problems. °? Is easier to heal from. ° °If your appendix tears and a sore forms: °· A drain may be put into the sore. The drain will be used to get rid of fluid. °· You may get an antibiotic  medicine through an IV tube. °· Your appendix may or may not need to be taken out. ° °This information is not intended to replace advice given to you by your health care provider. Make sure you discuss any questions you have with your health care provider. °Document Released: 12/15/2011 Document Revised: 02/28/2016 Document Reviewed: 02/07/2015 °Elsevier Interactive Patient Education © 2018 [employer] ° °

## 2017-09-21 ENCOUNTER — Encounter: Payer: Self-pay | Admitting: Cardiology

## 2017-09-21 NOTE — Progress Notes (Signed)
Cardiology Office Note  Date: 09/22/2017   ID: Lance [name], DOB 21-Jul-1976, MRN [id number]  PCP: [facility]  Evaluating Cardiologist: Nona Dell, MD   Chief Complaint  Patient presents with  . Coronary Artery Disease    History of Present Illness: Lance [name] is a 41 y.o. male seen most recently in the office by Ms. Lilla Shook in October. He saw Dr. Eden Emms prior to that in October. My last encounter was him was in November 2017 during an inpatient consultation. I reviewed interval records including recent cardiac catheterization and intervention. He is status post placement of DES to the RCA and OM with documented occlusion of the circumflex associated with collaterals. This intervention followed an abnormal cardiac CTA, was not done in the setting of ACS. He is currently enrolled in the OPTIMIZE research trial.  He presents today reporting no angina symptoms. He states that he has been compliant with his medications, was not able to increase Coreg higher than 3.125 mg twice daily, states it made him feel "sick."  He has not had follow-up lipids since August, had severe hypertriglyceridemia at that point. He states that he is due for follow-up lab work with endocrinology and we will add a fasting lipid panel. I also recommended that he start omega-3 supplements in addition to statin therapy.  In reviewing the chart I find that he was just recently admitted to the hospital and managed by Dr. Henreitta Leber on the surgical team with acute appendicitis. He was treated conservatively with antibiotics, did not undergo surgery due to recent DES intervention and need for ongoing dual antiplatelet therapy. Cardiology was not consulted during that hospital stay. It is not clear to me whether surgery is needed in the relatively near future, but the patient is under the impression that he still needs to have an operation.  Past Medical History:  Diagnosis Date  . CAD (coronary  artery disease), native coronary artery    10/18 PCI/DES to mRCA, and OM, with total occlusion of dLCx with collaterals.   . Essential hypertension   . GERD (gastroesophageal reflux disease)   . History of kidney stones   . Hyperlipemia   . SVT (supraventricular tachycardia) (HCC) 2015   Converted with adenosine  . Type 2 diabetes mellitus (HCC)     Past Surgical History:  Procedure Laterality Date  . CORONARY ANGIOPLASTY WITH STENT PLACEMENT  07/15/2017   "2 stents"  . LAPAROSCOPIC CHOLECYSTECTOMY    . LEFT HEART CATH AND CORONARY ANGIOGRAPHY N/A 07/15/2017   Procedure: LEFT HEART CATH AND CORONARY ANGIOGRAPHY;  Surgeon: Tonny Bollman, MD;  Location: [facility] INVASIVE CV LAB;  Service: Cardiovascular;  Laterality: N/A;    Current Outpatient Medications  Medication Sig Dispense Refill  . ALPRAZolam (XANAX) 1 MG tablet Take 1 mg by mouth daily as needed for anxiety.    Marland Kitchen aspirin EC 81 MG tablet Take 81 mg by mouth daily.    Marland Kitchen atorvastatin (LIPITOR) 80 MG tablet Take 1 tablet (80 mg total) by mouth daily. 30 tablet 0  . carvedilol (COREG) 3.125 MG tablet Take 1 tablet (3.125 mg total) by mouth 2 (two) times daily. 180 tablet 3  . clopidogrel (PLAVIX) 75 MG tablet Take 1 tablet (75 mg total) by mouth daily with breakfast. 90 tablet 2  . docusate sodium (COLACE) 100 MG capsule Take 1 capsule (100 mg total) by mouth 2 (two) times daily. 10 capsule 0  . ezetimibe (ZETIA) 10 MG tablet Take 1 tablet (  10 mg total) by mouth daily. 30 tablet 0  . Insulin Detemir (LEVEMIR FLEXPEN) 100 UNIT/ML Pen Inject 60 Units into the skin daily at 10 pm. 5 pen 2  . insulin lispro (HUMALOG KWIKPEN) 100 UNIT/ML KiwkPen Inject 10-16 Units into the skin 3 (three) times daily before meals.    Marland Kitchen. lisinopril (PRINIVIL,ZESTRIL) 40 MG tablet Take 40 mg by mouth daily.    . metFORMIN (GLUCOPHAGE) 500 MG tablet Take 1,000 mg by mouth 2 (two) times daily with a meal.     . nitroGLYCERIN (NITROSTAT) 0.4 MG SL tablet Place 1  tablet (0.4 mg total) under the tongue every 5 (five) minutes x 3 doses as needed for chest pain. 15 tablet 0  . Omega-3 1000 MG CAPS Take 1,000 mg by mouth 2 (two) times daily.    Marland Kitchen. oxyCODONE (OXY IR/ROXICODONE) 5 MG immediate release tablet Take 1 tablet (5 mg total) by mouth every 4 (four) hours as needed for severe pain or breakthrough pain. 15 tablet 0   No current facility-administered medications for this visit.    Allergies:  Bee venom and Penicillins   Social History: The patient  reports that he has been smoking cigarettes.  He has a 6.50 pack-year smoking history. he has never used smokeless tobacco. He reports that he does not drink alcohol or use drugs.   Family History: The patient's family history includes Diabetes in his mother; Gout in his mother; Heart attack (age of onset: 2757) in his father; Heart disease (age of onset: 1962) in his mother.   ROS:  Please see the history of present illness. Otherwise, complete review of systems is positive for occasional indigestion.  All other systems are reviewed and negative.   Physical Exam: VS:  BP 126/70   Pulse 82   Ht 5\' 11"  (1.803 m)   Wt 263 lb (119.3 kg)   SpO2 97%   BMI 36.68 kg/m , BMI Body mass index is 36.68 kg/m.  Wt Readings from Last 3 Encounters:  09/22/17 263 lb (119.3 kg)  09/04/17 264 lb (119.7 kg)  08/25/17 262 lb (118.8 kg)    General: Obese male, appears comfortable at rest. HEENT: Conjunctiva and lids normal, oropharynx clear. Neck: Supple, no elevated JVP or carotid bruits, no thyromegaly. Lungs: Clear to auscultation, nonlabored breathing at rest. Cardiac: Regular rate and rhythm, no S3, soft systolic murmur, no pericardial rub. Abdomen: Obese, nontender, bowel sounds present, no guarding or rebound. Extremities: No pitting edema, distal pulses 2+. Skin: Warm and dry. Musculoskeletal: No kyphosis. Neuropsychiatric: Alert and oriented x3, affect grossly appropriate.  ECG: I personally reviewed the  tracing from 09/04/2017 which showed normal sinus rhythm.  Recent Labwork: 05/10/2017: Magnesium 1.9; TSH 1.318 09/04/2017: ALT 38; AST 25 09/05/2017: BUN 16; Creatinine, Ser 0.93; Potassium 3.7; Sodium 136 09/06/2017: Hemoglobin 14.4; Platelets 175     Component Value Date/Time   CHOL 160 05/11/2017 0415   TRIG 1,145 (H) 05/11/2017 0415   HDL 19 (L) 05/11/2017 0415   CHOLHDL 8.4 05/11/2017 0415   VLDL UNABLE TO CALCULATE IF TRIGLYCERIDE OVER 400 mg/dL 95/28/413208/03/2017 44010415   LDLCALC UNABLE TO CALCULATE IF TRIGLYCERIDE OVER 400 mg/dL 02/72/536608/03/2017 44030415    Other Studies Reviewed Today:  Cardiac catheterization and PCI 07/15/2017: Conclusion   1. Three vessel CAD  Moderate eccentric proximal LAD stenosis with normal FFR analysis  Severe LCx/OM stenosis treated successfully with a Svelte DES through the Optimize clinical trial  Severe RCA stenosis (hemodynamically significant by FFR) treated successfully  with a Svelte DES through the Optimize clinical trial 2. Normal/vigorous LV function with LVEF >65%  DAPT with ASA and plavix x 12 months minimum.   **Pt blinded to stent type**   Echocardiogram 05/11/2017: Study Conclusions  - Left ventricle: The cavity size was normal. Wall thickness was   normal. Systolic function was normal. The estimated ejection   fraction was in the range of 55% to 60%. Left ventricular   diastolic function parameters were normal. - Aortic valve: Calcified non coronary cusp. Valve area (VTI): 2.28   cm^2. Valve area (Vmax): 2.26 cm^2. Valve area (Vmean): 2.37   cm^2. - Left atrium: The atrium was moderately dilated. - Atrial septum: No defect or patent foramen ovale was identified.  Assessment and Plan:  1. CAD documented by cardiac CT with cardiac catheterization demonstrating multivessel disease, moderate but nonobstructive in the LAD distribution, and status post DES intervention to the circumflex/OM as well as DES intervention to the RCA on October 10.  He is currently in the Optimize research trial. Aspirin and Plavix are anticipated for 12 months. Complicating matters however, patient was recently admitted to the hospital with acute appendicitis which was managed conservatively with IV antibiotics. I do not have complete information, but expect that he will need to have surgery, potentially in the short-term. He is only 2 months out from his intervention, I plan to communicate with the research team and also Dr. Excell Seltzerooper to get recommendations regarding perioperative management of antiplatelet regimen.  2. Mixed hyperlipidemia with severe hypertriglyceridemia documented by lab work in August. He has been on Lipitor and Zetia. Recommended starting omega-3 supplements. Obtain follow-up FLP when he gets lab work with endocrinology.  3. Type 2 diabetes mellitus, followed by Dr. Fransico HimNida. He is currently on insulin and Glucophage.  4. Essential hypertension, blood pressure is adequately controlled today.  Current medicines were reviewed with the patient today.   Orders Placed This Encounter  Procedures  . Lipid Profile    Disposition: Follow-up in the next 6 weeks.  Signed, Jonelle SidleSamuel G. Sandria Mcenroe, MD, [facility] 09/22/2017 1:45 PM    [facility] [address], [place], [place] [address] Phone: [phone]; Fax: [phone]

## 2017-09-22 ENCOUNTER — Ambulatory Visit (INDEPENDENT_AMBULATORY_CARE_PROVIDER_SITE_OTHER): Payer: 59 | Admitting: Cardiology

## 2017-09-22 ENCOUNTER — Encounter: Payer: Self-pay | Admitting: Cardiology

## 2017-09-22 VITALS — BP 126/70 | HR 82 | Ht 71.0 in | Wt 263.0 lb

## 2017-09-22 DIAGNOSIS — E1165 Type 2 diabetes mellitus with hyperglycemia: Secondary | ICD-10-CM | POA: Diagnosis not present

## 2017-09-22 DIAGNOSIS — E782 Mixed hyperlipidemia: Secondary | ICD-10-CM | POA: Diagnosis not present

## 2017-09-22 DIAGNOSIS — I10 Essential (primary) hypertension: Secondary | ICD-10-CM

## 2017-09-22 DIAGNOSIS — I25119 Atherosclerotic heart disease of native coronary artery with unspecified angina pectoris: Secondary | ICD-10-CM

## 2017-09-22 MED ORDER — CARVEDILOL 3.125 MG PO TABS: 3.1250 mg | ORAL_TABLET | Freq: Two times a day (BID) | ORAL | 3 refills | Status: DC

## 2017-09-22 NOTE — Patient Instructions (Addendum)
Your physician wants you to follow-up in:6 weeks With Dr.McDowell    Reduce your Coreg to 3.125 mg twice a day   START Omega 3 fish Oil, 1,000 mg twice a day    Please get FASTING Lipids drwan     Thank you for choosing [facility] !

## 2017-09-23 NOTE — Progress Notes (Signed)
Tough situation. He had 2 vessel PCI 10-10 electively (no ACS). If he can get to 3 months from PCI (10-15-17), it would be reasonable to hold clopidogrel for surgery and resume post-operatively as soon as it is safe to do so. He should continue on ASA 81 mg without interruption.

## 2017-09-24 ENCOUNTER — Telehealth: Payer: Self-pay

## 2017-09-24 NOTE — Telephone Encounter (Addendum)
-----   Message from Jonelle SidleSamuel G McDowell, MD sent at 09/23/2017  9:29 AM EST ----- Please see Dr. Earmon Phoenixooper's comments regarding my recent office visit with Lance [name]. His thought is that if he could get to 3 months out from DES intervention, which is not that much further out from now, he can likely come off Plavix temporarily for surgery if he needs to undergo appendectomy. Let patient know about this and I am also forwarding to his surgeon, Dr. Henreitta LeberBridges.  ----- Message ----- From: Tonny Bollmanooper, Michael, MD Sent: 09/23/2017   8:49 AM To: Jonelle SidleSamuel G McDowell, MD    Tough situation. He had 2 vessel PCI 10-10 electively (no ACS). If he can get to 3 months from PCI (10-15-17), it would be reasonable to hold clopidogrel for surgery and resume post-operatively as soon as it is safe to do so. He should continue on ASA 81 mg without interruption     I spoke with patient and relayed Dr McDowell's message to him

## 2017-10-05 ENCOUNTER — Other Ambulatory Visit: Payer: Self-pay

## 2017-10-05 ENCOUNTER — Emergency Department (HOSPITAL_COMMUNITY)
Admission: EM | Admit: 2017-10-05 | Discharge: 2017-10-05 | Disposition: A | Discharge status: Home / Self Care | Payer: 59 | Attending: Emergency Medicine | Admitting: Emergency Medicine

## 2017-10-05 ENCOUNTER — Encounter (HOSPITAL_COMMUNITY): Payer: Self-pay | Admitting: Emergency Medicine

## 2017-10-05 ENCOUNTER — Emergency Department (HOSPITAL_COMMUNITY): Payer: 59

## 2017-10-05 DIAGNOSIS — Z79899 Other long term (current) drug therapy: Secondary | ICD-10-CM | POA: Insufficient documentation

## 2017-10-05 DIAGNOSIS — R1031 Right lower quadrant pain: Secondary | ICD-10-CM

## 2017-10-05 DIAGNOSIS — Z7982 Long term (current) use of aspirin: Secondary | ICD-10-CM | POA: Insufficient documentation

## 2017-10-05 DIAGNOSIS — K4090 Unilateral inguinal hernia, without obstruction or gangrene, not specified as recurrent: Secondary | ICD-10-CM | POA: Diagnosis not present

## 2017-10-05 DIAGNOSIS — I2510 Atherosclerotic heart disease of native coronary artery without angina pectoris: Secondary | ICD-10-CM | POA: Insufficient documentation

## 2017-10-05 DIAGNOSIS — Z955 Presence of coronary angioplasty implant and graft: Secondary | ICD-10-CM | POA: Diagnosis not present

## 2017-10-05 DIAGNOSIS — Z9049 Acquired absence of other specified parts of digestive tract: Secondary | ICD-10-CM | POA: Insufficient documentation

## 2017-10-05 DIAGNOSIS — Z794 Long term (current) use of insulin: Secondary | ICD-10-CM | POA: Insufficient documentation

## 2017-10-05 DIAGNOSIS — Z7902 Long term (current) use of antithrombotics/antiplatelets: Secondary | ICD-10-CM | POA: Insufficient documentation

## 2017-10-05 DIAGNOSIS — I10 Essential (primary) hypertension: Secondary | ICD-10-CM | POA: Diagnosis not present

## 2017-10-05 DIAGNOSIS — E119 Type 2 diabetes mellitus without complications: Secondary | ICD-10-CM | POA: Insufficient documentation

## 2017-10-05 DIAGNOSIS — F17210 Nicotine dependence, cigarettes, uncomplicated: Secondary | ICD-10-CM | POA: Diagnosis not present

## 2017-10-05 LAB — COMPREHENSIVE METABOLIC PANEL
ALK PHOS: 66 U/L (ref 38–126)
ALT: 48 U/L (ref 17–63)
AST: 32 U/L (ref 15–41)
Albumin: 4 g/dL (ref 3.5–5.0)
Anion gap: 12 (ref 5–15)
BILIRUBIN TOTAL: 0.7 mg/dL (ref 0.3–1.2)
BUN: 17 mg/dL (ref 6–20)
CALCIUM: 9.2 mg/dL (ref 8.9–10.3)
CO2: 25 mmol/L (ref 22–32)
CREATININE: 0.98 mg/dL (ref 0.61–1.24)
Chloride: 103 mmol/L (ref 101–111)
GFR calc non Af Amer: >60 mL/min (ref >60)
GLUCOSE: 117 mg/dL — AB (ref 65–99)
Potassium: 3.8 mmol/L (ref 3.5–5.1)
SODIUM: 140 mmol/L (ref 135–145)
Total Protein: 6.9 g/dL (ref 6.5–8.1)

## 2017-10-05 LAB — CBC
HCT: 45.6 % (ref 39.0–52.0)
Hemoglobin: 14.7 g/dL (ref 13.0–17.0)
MCH: 28.9 pg (ref 26.0–34.0)
MCHC: 32.2 g/dL (ref 30.0–36.0)
MCV: 89.6 fL (ref 78.0–100.0)
PLATELETS: 193 10*3/uL (ref 150–400)
RBC: 5.09 MIL/uL (ref 4.22–5.81)
RDW: 13.4 % (ref 11.5–15.5)
WBC: 11.4 10*3/uL — ABNORMAL HIGH (ref 4.0–10.5)

## 2017-10-05 LAB — URINALYSIS, ROUTINE W REFLEX MICROSCOPIC
BILIRUBIN URINE: NEGATIVE
Glucose, UA: NEGATIVE mg/dL
HGB URINE DIPSTICK: NEGATIVE
Ketones, ur: NEGATIVE mg/dL
Leukocytes, UA: NEGATIVE
Nitrite: NEGATIVE
PROTEIN: NEGATIVE mg/dL
Specific Gravity, Urine: 1.024 (ref 1.005–1.030)
pH: 5 (ref 5.0–8.0)

## 2017-10-05 MED ORDER — METRONIDAZOLE 500 MG PO TABS: 500.0000 mg | ORAL_TABLET | Freq: Four times a day (QID) | ORAL | 0 refills | Status: DC

## 2017-10-05 MED ORDER — OXYCODONE-ACETAMINOPHEN 5-325 MG PO TABS: 1.0000 | ORAL_TABLET | ORAL | 0 refills | Status: DC | PRN

## 2017-10-05 MED ORDER — IOPAMIDOL (ISOVUE-300) INJECTION 61%
100.0000 mL | Freq: Once | INTRAVENOUS | Status: AC | PRN
  Administered 2017-10-05: 100 mL via INTRAVENOUS

## 2017-10-05 MED ORDER — METRONIDAZOLE IN NACL 5-0.79 MG/ML-% IV SOLN
500.0000 mg | Freq: Once | INTRAVENOUS | Status: AC
  Administered 2017-10-05: 500 mg via INTRAVENOUS
  Filled 2017-10-05: qty 100

## 2017-10-05 MED ORDER — CIPROFLOXACIN HCL 500 MG PO TABS: 500.0000 mg | ORAL_TABLET | Freq: Two times a day (BID) | ORAL | 0 refills | Status: DC

## 2017-10-05 MED ORDER — ONDANSETRON HCL 8 MG PO TABS: 8.0000 mg | ORAL_TABLET | ORAL | 0 refills | Status: DC | PRN

## 2017-10-05 MED ORDER — CIPROFLOXACIN IN D5W 400 MG/200ML IV SOLN
400.0000 mg | Freq: Once | INTRAVENOUS | Status: AC
  Administered 2017-10-05: 400 mg via INTRAVENOUS
  Filled 2017-10-05: qty 200

## 2017-10-05 NOTE — ED Notes (Signed)
Ct notified pt ready for CT.  Pt declined needing pain meds at this time, although offered if needed.

## 2017-10-05 NOTE — ED Notes (Signed)
Pt back from CT

## 2017-10-05 NOTE — ED Notes (Signed)
Pt to CT

## 2017-10-05 NOTE — ED Provider Notes (Signed)
[facility] Provider Note   CSN: [id number] Arrival date & time: 10/05/17  1441     History   Chief Complaint Chief Complaint  Patient presents with  . Abdominal Pain    HPI Joellyn HaffJustin C Fleener is a 41 y.o. male.  Intermittent right lateral abdominal pain radiating to the right lower quadrant for the past month.  Patient was admitted to the hospital in late November for appendicitis.  Surgery was not recommended at that time because he was on Plavix secondary to requiring 2 cardiac stents in October 2018.  He could not come off from the Plavix secondary to his CAD.  He was given intravenous antibiotics and discharged home with oral antibiotics.  No fever, sweats, chills, dysuria, nausea, vomiting, diarrhea.  Severity is mild to moderate.  Nothing makes symptoms better or worse.      Past Medical History:  Diagnosis Date  . CAD (coronary artery disease), native coronary artery    10/18 PCI/DES to mRCA, and OM, with total occlusion of dLCx with collaterals.   . Essential hypertension   . GERD (gastroesophageal reflux disease)   . History of kidney stones   . Hyperlipemia   . SVT (supraventricular tachycardia) (HCC) 2015   Converted with adenosine  . Type 2 diabetes mellitus [facility](HCC)     Patient Active Problem List   Diagnosis Date Noted  . Appendicitis 09/05/2017  . Heart disease   . S/P drug eluting coronary stent placement   . Uncontrolled type 2 diabetes mellitus with hyperglycemia (HCC) 08/25/2017  . CAD (coronary artery disease) 07/15/2017  . Class 1 obesity due to excess calories with serious comorbidity and body mass index (BMI) of 34.0 to 34.9 in adult 05/20/2017  . Leukocytosis 05/10/2017  . Family history of early CAD 12/29/2016  . Type 2 diabetes mellitus, uncontrolled (HCC) 08/31/2016  . Mixed hyperlipidemia 08/31/2016  . Current smoker 08/31/2016  . Abdominal pain, epigastric 04/21/2013  . Essential hypertension, benign 04/21/2013    Past  Surgical History:  Procedure Laterality Date  . CORONARY ANGIOPLASTY WITH STENT PLACEMENT  07/15/2017   "2 stents"  . LAPAROSCOPIC CHOLECYSTECTOMY    . LEFT HEART CATH AND CORONARY ANGIOGRAPHY N/A 07/15/2017   Procedure: LEFT HEART CATH AND CORONARY ANGIOGRAPHY;  Surgeon: Tonny Bollmanooper, Michael, MD;  [facility];  Service: Cardiovascular;  Laterality: N/A;       Home Medications    Prior to Admission medications   Medication Sig Start Date End Date Taking? Authorizing Provider  ALPRAZolam Prudy Feeler(XANAX) 1 MG tablet Take 1 mg by mouth daily as needed for anxiety.    [provider]  aspirin EC 81 MG tablet Take 81 mg by mouth daily.    [provider]  atorvastatin (LIPITOR) 80 MG tablet Take 1 tablet (80 mg total) by mouth daily. 09/01/16   Standley BrookingGoodrich, Daniel P, MD  carvedilol (COREG) 3.125 MG tablet Take 1 tablet (3.125 mg total) by mouth 2 (two) times daily. 09/22/17 12/21/17  Jonelle SidleMcDowell, Samuel G, MD  ciprofloxacin (CIPRO) 500 MG tablet Take 1 tablet (500 mg total) by mouth 2 (two) times daily. 10/05/17   Donnetta Hutchingook, Shevelle Smither, MD  clopidogrel (PLAVIX) 75 MG tablet Take 1 tablet (75 mg total) by mouth daily with breakfast. 07/16/17   Laverda Pageoberts, Lindsay B, NP  docusate sodium (COLACE) 100 MG capsule Take 1 capsule (100 mg total) by mouth 2 (two) times daily. 09/07/17   [name], Lindsay C, MD  ezetimibe (ZETIA) 10 MG tablet Take  1 tablet (10 mg total) by mouth daily. 05/13/17   Rodolph Bonghompson, Daniel V, MD  Insulin Detemir (LEVEMIR FLEXPEN) 100 UNIT/ML Pen Inject 60 Units into the skin daily at 10 pm. 05/20/17   Roma KayserNida, Gebreselassie W, MD  insulin lispro (HUMALOG KWIKPEN) 100 UNIT/ML KiwkPen Inject 10-16 Units into the skin 3 (three) times daily before meals.    [provider]  lisinopril (PRINIVIL,ZESTRIL) 40 MG tablet Take 40 mg by mouth daily.    [provider]  metFORMIN (GLUCOPHAGE) 500 MG tablet Take 1,000 mg by mouth 2 (two) times daily with a meal.     [provider]  metroNIDAZOLE (FLAGYL) 500 MG tablet Take 1 tablet (500 mg total) by mouth 4 (four) times daily. 10/05/17   Donnetta Hutchingook, Phelan Schadt, MD  nitroGLYCERIN (NITROSTAT) 0.4 MG SL tablet Place 1 tablet (0.4 mg total) under the tongue every 5 (five) minutes x 3 doses as needed for chest pain. 05/12/17   Rodolph Bonghompson, Daniel V, MD  Omega-3 1000 MG CAPS Take 1,000 mg by mouth 2 (two) times daily.    [provider]  ondansetron (ZOFRAN) 8 MG tablet Take 1 tablet (8 mg total) by mouth every 4 (four) hours as needed. 10/05/17   Donnetta Hutchingook, Benedetto Ryder, MD  oxyCODONE (OXY IR/ROXICODONE) 5 MG immediate release tablet Take 1 tablet (5 mg total) by mouth every 4 (four) hours as needed for severe pain or breakthrough pain. 09/07/17   [name], Lindsay C, MD  oxyCODONE-acetaminophen (PERCOCET) 5-325 MG tablet Take 1-2 tablets by mouth every 4 (four) hours as needed. 10/05/17   Donnetta Hutchingook, Minnie Shi, MD    Family History Family History  Problem Relation Age of Onset  . Heart disease Mother 6562       CABG  . Diabetes Mother   . Gout Mother   . Heart attack Father 6557    Social History Social History   Tobacco Use  . Smoking status: Current Some Day Smoker    Packs/day: 0.25    Years: 26.00    Pack years: 6.50    Types: Cigarettes  . Smokeless tobacco: Never Used  Substance Use Topics  . Alcohol use: No  . Drug use: No     Allergies   Bee venom and Penicillins   Review of Systems Review of Systems  All other systems reviewed and are negative.    Physical Exam Updated Vital Signs BP 127/70 (BP Location: Left Arm)   Pulse 71   Temp 98.5 F (36.9 C) (Oral)   Resp 16   Ht 5\' 11"  (1.803 m)   Wt 119.3 kg (263 lb)   SpO2 96%   BMI 36.68 kg/m   Physical Exam  Constitutional: He is oriented to person, place, and time.  Obese, no acute distress.  HENT:  Head: Normocephalic and atraumatic.  Eyes: Conjunctivae are normal.  Neck: Neck supple.  Cardiovascular: Normal rate and regular rhythm.    Pulmonary/Chest: Effort normal and breath sounds normal.  Abdominal: Soft. Bowel sounds are normal.  Patient is most tender in the right lateral abdomen  Musculoskeletal: Normal range of motion.  Neurological: He is alert and oriented to person, place, and time.  Skin: Skin is warm and dry.  Psychiatric: He has a normal mood and affect. His behavior is normal.  Nursing note and vitals reviewed.    ED Treatments / Results  Labs (all labs ordered are listed, but only abnormal results are displayed) Labs Reviewed  COMPREHENSIVE METABOLIC PANEL - Abnormal; Notable for the  following components:      Result Value   Glucose, Bld 117 (*)    All other components within normal limits  CBC - Abnormal; Notable for the following components:   WBC 11.4 (*)    All other components within normal limits  URINALYSIS, ROUTINE W REFLEX MICROSCOPIC    EKG  EKG Interpretation None       Radiology Ct Abdomen Pelvis W Contrast  Result Date: 10/05/2017 CLINICAL DATA:  Patient reports right sided abdominal pressure since last night. Patient reports was told in November needed to have appendix removed but reports unable to have surgery due to being placed on blood thinners from cardiac stent placement on October 10th EXAM: CT ABDOMEN AND PELVIS WITH CONTRAST TECHNIQUE: Multidetector CT imaging of the abdomen and pelvis was performed using the standard protocol following bolus administration of intravenous contrast. CONTRAST:  ISOVUE-300 IOPAMIDOL (ISOVUE-300) INJECTION 61% COMPARISON:  09/04/2017 FINDINGS: Lower chest: Mild dependent atelectasis in the lung bases. Hepatobiliary: No focal liver abnormality is seen. Status post cholecystectomy. No biliary dilatation. Pancreas: Unremarkable. No pancreatic ductal dilatation or surrounding inflammatory changes. Spleen: Normal in size without focal abnormality. Adrenals/Urinary Tract: Adrenal glands are unremarkable. Kidneys are normal, without renal  calculi, focal lesion, or hydronephrosis. Bladder is unremarkable. Stomach/Bowel: Stomach, small bowel, and colon are not abnormally distended. Stool fills the colon diffusely. No wall thickening or inflammatory changes are identified. The appendix is posterior to the cecum. Inflammatory changes have mostly resolved since the previous study. Appendiceal diameter now measures 8 mm and the periappendiceal stranding has almost completely resolved. No developing abscess or periappendiceal collection. Vascular/Lymphatic: Scattered calcification in the aorta. No aneurysm. No significant lymphadenopathy. Reproductive: Prostate is unremarkable. Other: Small left inguinal hernia containing fat. No free air. No abdominopelvic ascites. Musculoskeletal: No acute or significant osseous findings. IMPRESSION: 1. Near complete interval resolution of inflammatory changes involving the appendix. Appendiceal diameter now measures 8 mm and periappendiceal stranding has almost completely resolved. No developing abscess. 2. No evidence of bowel obstruction or inflammation. 3. Small left inguinal hernia containing fat. Electronically Signed   By: Burman Nieves M.D.   On: 10/05/2017 19:01    Procedures Procedures (including critical care time)  Medications Ordered in ED Medications  iopamidol (ISOVUE-300) 61 % injection 100 mL (100 mLs Intravenous Contrast Given 10/05/17 1838)  metroNIDAZOLE (FLAGYL) IVPB 500 mg (0 mg Intravenous Stopped 10/05/17 2107)  ciprofloxacin (CIPRO) IVPB 400 mg (0 mg Intravenous Stopped 10/05/17 2107)     Initial Impression / Assessment and Plan / ED Course  I have reviewed the triage vital signs and the nursing notes.  Pertinent labs & imaging results that were available during my care of the patient were reviewed by me and considered in my medical decision making (see chart for details).     Patient was diagnosed with appendicitis approximately 1 month ago and treated with intravenous and  oral antibiotics.  Surgery was not recommended secondary to his anticoagulation.  CT scan today revealed near complete resolution of the inflammatory changes involving the appendix.  I discussed his care with a general surgeon on call.  Patient was given IV Cipro and IV Flagyl in the ED.  Discharge medications oral Flagyl, oral Cipro, Percocet, Zofran 8 mg.  He will follow up with general surgery.  Final Clinical Impressions(s) / ED Diagnoses   Final diagnoses:  Right lower quadrant abdominal pain    ED Discharge Orders        Ordered  ciprofloxacin (CIPRO) 500 MG tablet  2 times daily     10/05/17 2118    metroNIDAZOLE (FLAGYL) 500 MG tablet  4 times daily     10/05/17 2118    oxyCODONE-acetaminophen (PERCOCET) 5-325 MG tablet  Every 4 hours PRN     10/05/17 2118    ondansetron (ZOFRAN) 8 MG tablet  Every 4 hours PRN     10/05/17 2118       Donnetta Hutching, MD 10/05/17 2135

## 2017-10-05 NOTE — Discharge Instructions (Signed)
CT scan revealed almost complete resolution of your appendix inflammation.  I discussed your case with the general surgeon on call.  He recommended continuation of the antibiotics.  You will be prescribed 2 antibiotics, pain and nausea medicine.  Follow-up with general surgery or return if worse.

## 2017-10-05 NOTE — ED Triage Notes (Signed)
Pt reports right sided abdominal pressure since last night. Pt reports was told in November needed to have appendix removed but reports unable to have surgery due to being placed on blood thinners from cardiac stent placement on October 10th. Pt denies currently being on antibiotics. Pt reports last BM this am.

## 2017-10-07 ENCOUNTER — Telehealth: Payer: Self-pay | Admitting: Cardiovascular Disease

## 2017-10-07 NOTE — Telephone Encounter (Signed)
New message   Pt verbalized that he needs a referral for a surgeon for his procedure to have his appendix removed.    He states that Dr.Cooper told him [place], pt just need a referral.

## 2017-10-07 NOTE — Telephone Encounter (Signed)
Patient reports Dr. Excell Seltzerooper [place] [facility] [place] [facility] in case there are cardiac complications so [facility]. Encouraged patient to get referral from PCP - insurance may require that anyway and they can get him the most appropriate surgeon. He also requests Billing information. He states his insurance is not wanting to pay for 2 days in the hospital because they were "unnecessary."  Informed him a message will be sent to the Billing department to call him to assist. He was grateful for call and agrees with treatment plan.

## 2017-10-08 ENCOUNTER — Encounter: Payer: Self-pay | Admitting: Cardiology

## 2017-10-10 DIAGNOSIS — E782 Mixed hyperlipidemia: Secondary | ICD-10-CM | POA: Diagnosis not present

## 2017-10-10 DIAGNOSIS — E1165 Type 2 diabetes mellitus with hyperglycemia: Secondary | ICD-10-CM | POA: Diagnosis not present

## 2017-10-12 LAB — HEMOGLOBIN A1C
HEMOGLOBIN A1C: 6.8 %{Hb} — AB (ref <5.7)
MEAN PLASMA GLUCOSE: 148 (calc)
eAG (mmol/L): 8.2 (calc)

## 2017-10-12 LAB — LIPID PANEL
CHOLESTEROL: 88 mg/dL (ref <200)
HDL: 23 mg/dL — AB (ref >40)
LDL CHOLESTEROL (CALC): 44 mg/dL
NON-HDL CHOLESTEROL (CALC): 65 mg/dL (ref <130)
Total CHOL/HDL Ratio: 3.8 (calc) (ref <5.0)
Triglycerides: 127 mg/dL (ref <150)

## 2017-10-12 LAB — VITAMIN D 25 HYDROXY (VIT D DEFICIENCY, FRACTURES): VIT D 25 HYDROXY: 33 ng/mL (ref 30–100)

## 2017-10-12 LAB — MICROALBUMIN / CREATININE URINE RATIO
CREATININE, URINE: 141 mg/dL (ref 20–320)
Microalb Creat Ratio: 6 mcg/mg creat (ref <30)
Microalb, Ur: 0.9 mg/dL

## 2017-10-12 LAB — T4, FREE: Free T4: 1.2 ng/dL (ref 0.8–1.8)

## 2017-10-12 LAB — TSH: TSH: 2.85 mIU/L (ref 0.40–4.50)

## 2017-10-20 ENCOUNTER — Encounter: Payer: Self-pay | Admitting: Nutrition

## 2017-10-20 ENCOUNTER — Encounter: Payer: Self-pay | Admitting: "Endocrinology

## 2017-10-20 ENCOUNTER — Encounter: Payer: 59 | Attending: "Endocrinology | Admitting: Nutrition

## 2017-10-20 ENCOUNTER — Ambulatory Visit: Payer: 59 | Admitting: General Surgery

## 2017-10-20 ENCOUNTER — Ambulatory Visit (INDEPENDENT_AMBULATORY_CARE_PROVIDER_SITE_OTHER): Payer: 59 | Admitting: "Endocrinology

## 2017-10-20 ENCOUNTER — Telehealth: Payer: Self-pay | Admitting: General Surgery

## 2017-10-20 VITALS — Ht 71.0 in | Wt 266.0 lb

## 2017-10-20 VITALS — BP 147/85 | HR 71 | Ht 71.0 in | Wt 266.0 lb

## 2017-10-20 DIAGNOSIS — E782 Mixed hyperlipidemia: Secondary | ICD-10-CM

## 2017-10-20 DIAGNOSIS — E669 Obesity, unspecified: Secondary | ICD-10-CM

## 2017-10-20 DIAGNOSIS — E1165 Type 2 diabetes mellitus with hyperglycemia: Secondary | ICD-10-CM | POA: Insufficient documentation

## 2017-10-20 DIAGNOSIS — Z713 Dietary counseling and surveillance: Secondary | ICD-10-CM | POA: Diagnosis not present

## 2017-10-20 DIAGNOSIS — E1122 Type 2 diabetes mellitus with diabetic chronic kidney disease: Secondary | ICD-10-CM | POA: Insufficient documentation

## 2017-10-20 DIAGNOSIS — I10 Essential (primary) hypertension: Secondary | ICD-10-CM

## 2017-10-20 DIAGNOSIS — Z794 Long term (current) use of insulin: Secondary | ICD-10-CM | POA: Diagnosis not present

## 2017-10-20 DIAGNOSIS — N189 Chronic kidney disease, unspecified: Secondary | ICD-10-CM | POA: Insufficient documentation

## 2017-10-20 DIAGNOSIS — IMO0002 Reserved for concepts with insufficient information to code with codable children: Secondary | ICD-10-CM

## 2017-10-20 DIAGNOSIS — E118 Type 2 diabetes mellitus with unspecified complications: Secondary | ICD-10-CM

## 2017-10-20 NOTE — Patient Instructions (Signed)
Goals 1. Lose 2-3 lbs per month 2. Increase more lower carb vegetables. 3. Increase Fiber in diet. Keep A1C  6.8% or less.

## 2017-10-20 NOTE — Progress Notes (Signed)
Diabetes Self-Management Education  Visit Type:    Appt. Start Time: 0800 Appt. End Time: 930  10/20/2017  [name], identified by name and date of birth, is a 42 y.o. male with a diagnosis of Diabetes:  . Has cut out sweets 99%. Cut out a lot of breads and potatoes. Increased more lower carb vegetables. Has cut down on eating fast foods. Feels better.  Has a lot more energy, no blurry vision and less urination and less hunger/thirst. Has been using air fryer now and doesn't use grease.Marland Kitchen.  B) Eggs and [place] bacon, Coffee, L) Grilled chicken or vegetables tray-green beans, corn, okra, water Dinner  NiSourcerilled meat and veggies, water Walking 30 minutes on lunch break. Using his fit bit-gets in 12000-15000 steps a day.     FBS now are 80-120's.  Levemir 60 units a day and Humalog 5 units TID plus sliding scale.  Metformin 500 mg BID, Jardiance has been stopped. He continues to be motivated to make changes with his diet and food. Wants to work on weight loss. Having his appendix taken out. Meets with surgeon 10-28-17.   Lipid Panel     Component Value Date/Time   CHOL 88 10/10/2017 0929   TRIG 127 10/10/2017 0929   HDL 23 (L) 10/10/2017 0929   CHOLHDL 3.8 10/10/2017 0929   VLDL UNABLE TO CALCULATE IF TRIGLYCERIDE OVER 400 mg/dL 14/78/295608/03/2017 21300415   LDLCALC UNABLE TO CALCULATE IF TRIGLYCERIDE OVER 400 mg/dL 86/57/846908/03/2017 [id number]   Lab Results  Component Value Date   HGBA1C 6.8 (H) 10/10/2017    ASSESSMENT  Height 5\' 11"  (1.803 m), weight 266 lb (120.7 kg). Body mass index is 37.1 kg/m.    Individualized Plan for Diabetes Self-Management Training:   Learning Objective:  Patient will have a greater understanding of diabetes self-management. Patient education plan is to attend individual and/or group sessions per assessed needs and concerns.   Plan:  Goals 1. Lose 2-3 lbs per month 2. Increase more lower carb vegetables. 3. Increase Fiber in diet. Keep A1C  6.8% or  less.  Expected Outcomes:    Improved self management and reduced complications.  Education material provided: Living Well with Diabetes, Food label handouts, A1C conversion sheet, Meal plan card, My Plate and Carbohydrate counting sheet  If problems or questions, patient to contact team via:  Phone and Email  Future DSME appointment:   3 months.

## 2017-10-20 NOTE — Patient Instructions (Signed)

## 2017-10-20 NOTE — Progress Notes (Signed)
Subjective:    Patient ID: Lance [name], male    DOB: 26-Feb-1976. Patient is being seen in consultation for management of diabetes requested by  [facility]  Past Medical History:  Diagnosis Date  . CAD (coronary artery disease), native coronary artery    10/18 PCI/DES to mRCA, and OM, with total occlusion of dLCx with collaterals.   . Essential hypertension   . GERD (gastroesophageal reflux disease)   . History of kidney stones   . Hyperlipemia   . SVT (supraventricular tachycardia) (HCC) 2015   Converted with adenosine  . Type 2 diabetes mellitus (HCC)    Past Surgical History:  Procedure Laterality Date  . CORONARY ANGIOPLASTY WITH STENT PLACEMENT  07/15/2017   "2 stents"  . LAPAROSCOPIC CHOLECYSTECTOMY    . LEFT HEART CATH AND CORONARY ANGIOGRAPHY N/A 07/15/2017   Procedure: LEFT HEART CATH AND CORONARY ANGIOGRAPHY;  Surgeon: Tonny Bollman, MD;  Location[facility]VASIVE CV LAB;  Service: Cardiovascular;  Laterality: N/A;   Social History   Socioeconomic History  . Marital status: Significant Other    Spouse name: None  . Number of children: None  . Years of education: None  . Highest education level: None  Social Needs  . Financial resource strain: None  . Food insecurity - worry: None  . Food insecurity - inability: None  . Transportation needs - medical: None  . Transportation needs - non-medical: None  Occupational History  . None  Tobacco Use  . Smoking status: Current Some Day Smoker    Packs/day: 0.25    Years: 26.00    Pack years: 6.50    Types: Cigarettes  . Smokeless tobacco: Never Used  Substance and Sexual Activity  . Alcohol use: No  . Drug use: No  . Sexual activity: Yes    Birth control/protection: None  Other Topics Concern  . None  Social History Narrative  . None   Outpatient Encounter Medications as of 10/20/2017  Medication Sig  . ALPRAZolam (XANAX) 1 MG tablet Take 1 mg by mouth daily as needed for anxiety.  Marland Kitchen  aspirin EC 81 MG tablet Take 81 mg by mouth daily.  Marland Kitchen atorvastatin (LIPITOR) 80 MG tablet Take 1 tablet (80 mg total) by mouth daily.  . carvedilol (COREG) 3.125 MG tablet Take 1 tablet (3.125 mg total) by mouth 2 (two) times daily.  . ciprofloxacin (CIPRO) 500 MG tablet Take 1 tablet (500 mg total) by mouth 2 (two) times daily.  . clopidogrel (PLAVIX) 75 MG tablet Take 1 tablet (75 mg total) by mouth daily with breakfast.  . docusate sodium (COLACE) 100 MG capsule Take 1 capsule (100 mg total) by mouth 2 (two) times daily.  Marland Kitchen ezetimibe (ZETIA) 10 MG tablet Take 1 tablet (10 mg total) by mouth daily.  . Insulin Detemir (LEVEMIR FLEXPEN) 100 UNIT/ML Pen Inject 60 Units into the skin daily at 10 pm.  . lisinopril (PRINIVIL,ZESTRIL) 40 MG tablet Take 40 mg by mouth daily.  . metFORMIN (GLUCOPHAGE) 500 MG tablet Take 1,000 mg by mouth 2 (two) times daily with a meal.   . metroNIDAZOLE (FLAGYL) 500 MG tablet Take 1 tablet (500 mg total) by mouth 4 (four) times daily.  . nitroGLYCERIN (NITROSTAT) 0.4 MG SL tablet Place 1 tablet (0.4 mg total) under the tongue every 5 (five) minutes x 3 doses as needed for chest pain.  . Omega-3 1000 MG CAPS Take 1,000 mg by mouth 2 (two) times daily.  . ondansetron (  ZOFRAN) 8 MG tablet Take 1 tablet (8 mg total) by mouth every 4 (four) hours as needed.  Marland Kitchen oxyCODONE (OXY IR/ROXICODONE) 5 MG immediate release tablet Take 1 tablet (5 mg total) by mouth every 4 (four) hours as needed for severe pain or breakthrough pain.  Marland Kitchen oxyCODONE-acetaminophen (PERCOCET) 5-325 MG tablet Take 1-2 tablets by mouth every 4 (four) hours as needed.  . [DISCONTINUED] insulin lispro (HUMALOG KWIKPEN) 100 UNIT/ML KiwkPen Inject 10-16 Units into the skin 3 (three) times daily before meals.   No facility-administered encounter medications on file as of 10/20/2017.    ALLERGIES: Allergies  Allergen Reactions  . Bee Venom Swelling  . Penicillins Itching and Rash    Has patient had a PCN  reaction causing immediate rash, facial/tongue/throat swelling, SOB or lightheadedness with hypotension: Yes Has patient had a PCN reaction causing severe rash involving mucus membranes or skin necrosis: No Has patient had a PCN reaction that required hospitalization: No Has patient had a PCN reaction occurring within the last 10 years: No If all of the above answers are "NO", then may proceed with Cephalosporin use.    VACCINATION STATUS:  There is no immunization history on file for this patient.  Diabetes  He presents for his follow-up diabetic visit. He has type 2 diabetes mellitus. Onset time: He was diagnosed at approximate age of 35 years. His disease course has been improving (Was recently hospitalized due to hyperglycemia.). There are no hypoglycemic associated symptoms. Pertinent negatives for hypoglycemia include no confusion, headaches, pallor or seizures. Associated symptoms include blurred vision. Pertinent negatives for diabetes include no chest pain, no fatigue, no polydipsia, no polyphagia, no polyuria and no weakness. There are no hypoglycemic complications. Symptoms are improving. There are no diabetic complications. Risk factors for coronary artery disease include diabetes mellitus, dyslipidemia, family history, hypertension, male sex, obesity, tobacco exposure and sedentary lifestyle. Current diabetic treatment includes intensive insulin program (He is on Levemir 68 units BID and Humalog 10 units TIDAC.). His weight is increasing steadily. He is following a generally unhealthy diet. When asked about meal planning, he reported none. He has not had a previous visit with a dietitian. He rarely participates in exercise. His home blood glucose trend is decreasing steadily. His overall blood glucose range is 130-140 mg/dl. (He did not bring his logs , his meter EAG 133) An ACE inhibitor/angiotensin II receptor blocker is being taken. He does not see a podiatrist.Eye exam is not current.   Hyperlipidemia  This is a chronic problem. The current episode started more than 1 year ago. The problem is controlled. Exacerbating diseases include diabetes and obesity. Pertinent negatives include no chest pain, myalgias or shortness of breath. Current antihyperlipidemic treatment includes statins and bile acid squestrants. The current treatment provides significant improvement of lipids. Risk factors for coronary artery disease include diabetes mellitus, dyslipidemia, hypertension, male sex, obesity and a sedentary lifestyle.  Hypertension  This is a chronic problem. The current episode started more than 1 year ago. Associated symptoms include blurred vision. Pertinent negatives include no chest pain, headaches, neck pain, palpitations or shortness of breath. Risk factors for coronary artery disease include dyslipidemia, diabetes mellitus, male gender and smoking/tobacco exposure. Past treatments include ACE inhibitors.     Review of Systems  Constitutional: Negative for chills, fatigue, fever and unexpected weight change.  HENT: Negative for dental problem, mouth sores and trouble swallowing.   Eyes: Positive for blurred vision. Negative for visual disturbance.  Respiratory: Negative for cough, choking, chest tightness,  shortness of breath and wheezing.   Cardiovascular: Negative for chest pain, palpitations and leg swelling.  Gastrointestinal: Negative for abdominal distention, abdominal pain, constipation, diarrhea, nausea and vomiting.  Endocrine: Negative for polydipsia, polyphagia and polyuria.  Genitourinary: Negative for dysuria, flank pain, hematuria and urgency.  Musculoskeletal: Negative for back pain, gait problem, myalgias and neck pain.  Skin: Negative for pallor, rash and wound.  Neurological: Negative for seizures, syncope, weakness, numbness and headaches.  Psychiatric/Behavioral: Negative.  Negative for confusion and dysphoric mood.    Objective:    BP (!) 147/85    Pulse 71   Ht 5\' 11"  (1.803 m)   Wt 266 lb (120.7 kg)   BMI 37.10 kg/m   Wt Readings from Last 3 Encounters:  10/20/17 266 lb (120.7 kg)  10/20/17 266 lb (120.7 kg)  10/05/17 263 lb (119.3 kg)    Physical Exam  Constitutional: He is oriented to person, place, and time. He appears well-developed and well-nourished. He is cooperative. No distress.  HENT:  Head: Normocephalic and atraumatic.  Eyes: EOM are normal.  Neck: Normal range of motion. Neck supple. No tracheal deviation present. No thyromegaly present.  Cardiovascular: Normal rate, S1 normal, S2 normal and normal heart sounds. Exam reveals no gallop.  No murmur heard. Pulses:      Dorsalis pedis pulses are 1+ on the right side, and 1+ on the left side.       Posterior tibial pulses are 1+ on the right side, and 1+ on the left side.  Pulmonary/Chest: Breath sounds normal. No respiratory distress. He has no wheezes.  Abdominal: Soft. Bowel sounds are normal. He exhibits no distension. There is no tenderness. There is no guarding and no CVA tenderness.  Musculoskeletal: He exhibits no edema.       Right shoulder: He exhibits no swelling and no deformity.  Neurological: He is alert and oriented to person, place, and time. He has normal strength and normal reflexes. No cranial nerve deficit or sensory deficit. Gait normal.  Skin: Skin is warm and dry. No rash noted. No cyanosis. Nails show no clubbing.  Psychiatric: He has a normal mood and affect. His speech is normal and behavior is normal. Judgment and thought content normal. Cognition and memory are normal.   CMP ( most recent) CMP     Component Value Date/Time   NA 140 10/05/2017 1515   NA 141 07/07/2017 0837   K 3.8 10/05/2017 1515   CL 103 10/05/2017 1515   CO2 25 10/05/2017 1515   GLUCOSE 117 (H) 10/05/2017 1515   BUN 17 10/05/2017 1515   BUN 15 07/07/2017 0837   CREATININE 0.98 10/05/2017 1515   CREATININE 0.97 07/11/2017 0836   CALCIUM 9.2 10/05/2017 1515   PROT  6.9 10/05/2017 1515   ALBUMIN 4.0 10/05/2017 1515   AST 32 10/05/2017 1515   ALT 48 10/05/2017 1515   ALKPHOS 66 10/05/2017 1515   BILITOT 0.7 10/05/2017 1515   GFRNONAA >60 10/05/2017 1515   GFRAA >60 10/05/2017 1515     Diabetic Labs (most recent): Lab Results  Component Value Date   HGBA1C 6.8 (H) 10/10/2017   HGBA1C 14.7 (H) 05/10/2017   HGBA1C 11.8 11/07/2016     Lipid Panel ( most recent) Lipid Panel     Component Value Date/Time   CHOL 88 10/10/2017 0929   TRIG 127 10/10/2017 0929   HDL 23 (L) 10/10/2017 0929   CHOLHDL 3.8 10/10/2017 0929   VLDL UNABLE TO CALCULATE IF TRIGLYCERIDE  OVER 400 mg/dL 16/07/9603 5409   LDLCALC UNABLE TO CALCULATE IF TRIGLYCERIDE OVER 400 mg/dL 81/19/1478 2956     Assessment & Plan:   1. Uncontrolled type 2 diabetes mellitus with chronic kidney disease, with long-term current use of insulin, unspecified CKD stage (HCC)  - Patient has currently uncontrolled symptomatic type 2 DM since  42 years of age. - He came with improved blood glucose profile average 132 over the last 30 days.  - He presents with significant improvement in his glycemic profile average 133, A1c improving to 6.8% from 14.7%.  Recent labs reviewed, showing normal renal function.  -his diabetes is complicated by obesity/sedentary life, chronic heavy smoking and Lance [name] remains at a high risk for more acute and chronic complications which include CAD, CVA, CKD, retinopathy, and neuropathy. These are all discussed in detail with the patient.  - I have counseled him on diet management and weight loss, by adopting a carbohydrate restricted/protein rich diet.  -  Suggestion is made for him to avoid simple carbohydrates  from his diet including Cakes, Sweet Desserts / Pastries, Ice Cream, Soda (diet and regular), Sweet Tea, Candies, Chips, Cookies, Store Bought Juices, Alcohol in Excess of  1-2 drinks a day, Artificial Sweeteners, and "Sugar-free" Products. This will help  patient to have stable blood glucose profile and potentially avoid unintended weight gain.  - I encouraged him to switch to  unprocessed or minimally processed complex starch and increased protein intake (animal or plant source), fruits, and vegetables.  - he is advised to stick to a routine mealtimes to eat 3 meals  a day and avoid unnecessary snacks ( to snack only to correct hypoglycemia).  - His consult with  Norm Salt, CDE is pending for individualized diabetes education.  - I have approached him with the following individualized plan to manage diabetes and patient agrees:   - Based on his significant improvement and target glycemic profile with minimal prandial insulin, I suggested the following adjustments. - I  will continue with his basal insulin Levemir 60 units daily at bedtime ,  hold  Humalog for now, continue  monitoring blood glucose 2 times daily-daily before breakfast and at bedtime.   - Patient is warned not to take insulin without proper monitoring per orders. -Adjustment parameters are given for hypo and hyperglycemia in writing. -Patient is encouraged to call clinic for blood glucose levels less than 70 or above 200 mg /dl. - He has tolerated metformin, I advised him to continue 1000 mg by mouth twice a day.   - he will be considered for incretin therapy if hypertriglyceridemia improves by  next visit, to give him a chance to come off of prandial insulin. - Patient specific target  A1c;  LDL, HDL, Triglycerides, and  Waist Circumference were discussed in detail.  2) BP/HTN: uncontrolled. I advised him to continue  current medications including ACEI/ARB. 3) Lipids: Showing significant improvement, triglycerides improving from 1145 to 127. Patient is advised to continue statins, Lovaza, and ezetimibe. 4)  Weight/Diet: CDE Consult has been initiated -  consult pending , exercise, and detailed carbohydrates information provided.  5) Chronic Care/Health  Maintenance:  -he  is on ACEI/ARB and Statin medications and  is encouraged to continue to follow up with Ophthalmology, Dentist,  Podiatrist at least yearly or according to recommendations, and advised to  quit smoking. I have recommended yearly flu vaccine and pneumonia vaccination at least every 5 years; moderate intensity exercise for up to 150 minutes  weekly; and  sleep for at least 7 hours a day.  - I advised patient to maintain close follow up with [facility] for primary care needs.  - Time spent with the patient: 25 min, of which >50% was spent in reviewing his blood glucose logs , discussing his hypo- and hyper-glycemic episodes, reviewing his current and  previous labs and insulin doses and developing a plan to avoid hypo- and hyper-glycemia. Please refer to Patient Instructions for Blood Glucose Monitoring and Insulin/Medications Dosing Guide"  in media tab for additional information.  Follow up plan: - Return in about 3 months (around 01/18/2018) for follow up with pre-visit labs, meter, and logs.  Marquis LunchGebre Dariyon Urquilla, MD Phone: [phone]  Fax: [phone]   10/20/2017, 9:22 AM

## 2017-10-20 NOTE — Telephone Encounter (Signed)
[facility]  Patient with acute appendicitis 09/04/17 in the setting of recent stent placement and plavix use. We managed him nonoperatively given the risk with his stents. He was seen by Cardiology that said a 3 month interval would be adequate for holding plavix, and the patient has actually spoke with the cardiologist who placed the stents and he wants him to be evaluated and [place]. He says he has been referred for this but I do not see an appt in the system to date.   I explained to him the rational behind doing an interval appendectomy when safe from a cardiac standpoint given the risk of recurrence, risk of appendix harboring a cancer, etc, and we discussed the non operative approach in [place] but that this is not the common practice in the [place] as of yet given the population differences.   He understands. He reports some intermittent RLQ pain.  He says he wants to get his appendix out. We discussed that [place] the plan for interval appendectomy, and if he has any issues he can contact us back.  Algis GreenhouseLindsay Bridges, MD [facility] [address] [place], [place] [id number] [phone] (office)

## 2017-10-28 ENCOUNTER — Ambulatory Visit: Payer: Self-pay | Admitting: Surgery

## 2017-10-28 DIAGNOSIS — K3580 Unspecified acute appendicitis: Secondary | ICD-10-CM | POA: Diagnosis not present

## 2017-10-28 DIAGNOSIS — Z01818 Encounter for other preprocedural examination: Secondary | ICD-10-CM | POA: Diagnosis not present

## 2017-10-28 DIAGNOSIS — Z720 Tobacco use: Secondary | ICD-10-CM | POA: Diagnosis not present

## 2017-10-28 NOTE — H&P (View-Only) (Signed)
Lance [name] Documented: 10/28/2017 3:39 PM Location: [facility] Patient #: [id number] DOB: December 22, 1975 Married / Language: English / Race: White Male  History of Present Illness [name] MD; 10/28/2017 6:13 PM) The patient is a 43 year old male who presents with appendicitis. Note for "Appendicitis": ` ` ` Patient sent for surgical consultation at the request of Dr. Tonny Bollman  Chief Complaint: Appendicitis. Need for interval appendectomy.  The patient is an obese smoking diabetic male. Had recurrent chest pain and found to have myocardial infarction. Underwent stenting in October 2018. Place on Plavix. The next month he developed acute appendicitis. Was treated with IV antibiotics nonoperatively at [facility]. He improved over a few days. Transitioned home. Concerned about need for interval appendectomy. He's been followed by Dr. Tonny Bollman. They were strongly hoping that surgery could be avoided for the first 3 months. His just past that point. Cardiology recommended surgical care [facility] with a could help manage him perioperatively.  Patient had laparoscopic cholecystectomy in [facility] in [place], [place] last year. No major issues with that that he can recall. Usually moves his bowels every day. He's cut back on his cigarettes from 2 packs a day to about 5 cigarettes a day but has not completely quit. He had similar pain and came to emergency room 12/31. Repeat CT scan in late December showed improvement and near resolution of his appendicitis. He denies much pain right now. He's never had a colonoscopy.  No personal nor family history of GI/colon cancer, inflammatory bowel disease, irritable bowel syndrome, allergy such as Celiac Sprue, dietary/dairy problems, colitis, ulcers nor gastritis. No recent sick contacts/gastroenteritis. No travel outside the country. No changes in diet. No dysphagia to solids or liquids. No  significant heartburn or reflux. No hematochezia, hematemesis, coffee ground emesis. No evidence of prior gastric/peptic ulceration.    (Review of systems as stated in this history (HPI) or in the review of systems. Otherwise all other 12 point ROS are negative)   Past Surgical History Ethlyn Gallery, CMA; 10/28/2017 3:39 PM) Gallbladder Surgery - Open  Diagnostic Studies History Elease Hashimoto Spillers, CMA; 10/28/2017 3:39 PM) Colonoscopy never  Allergies Elease Hashimoto Spillers, CMA; 10/28/2017 3:41 PM) Penicillins  Medication History (Alisha Spillers, CMA; 10/28/2017 3:43 PM) ALPRAZolam (1MG  Tablet, Oral) Active. Atorvastatin Calcium (80MG  Tablet, Oral) Active. Carvedilol (3.125MG  Tablet, Oral) Active. Clopidogrel Bisulfate (75MG  Tablet, Oral) Active. HumaLOG KwikPen (100UNIT/ML Soln Pen-inj, Subcutaneous) Active. Jardiance (10MG  Tablet, Oral) Active. Lisinopril (40MG  Tablet, Oral) Active. MetFORMIN HCl ER (500MG  Tablet ER 24HR, Oral) Active. Omeprazole (40MG  Capsule DR, Oral) Active. Fish Oil (600MG  Capsule, Oral) Active. Baby Aspirin (81MG  Tablet Chewable, Oral) Active. Ezetimibe (10MG  Tablet, Oral) Active. Medications Reconciled  Social History Ethlyn Gallery, CMA; 10/28/2017 3:39 PM) Caffeine use Carbonated beverages, Coffee. Illicit drug use Prefer to discuss with provider. No alcohol use Tobacco use Current every day smoker.  Family History Ethlyn Gallery, CMA; 10/28/2017 3:39 PM) Family history unknown First Degree Relatives  Other Problems Ethlyn Gallery, CMA; 10/28/2017 3:39 PM) Diabetes Mellitus Gastroesophageal Reflux Disease Heart murmur High blood pressure Hypercholesterolemia Kidney Stone     Review of Systems Elease Hashimoto Spillers CMA; 10/28/2017 3:39 PM) General Not Present- Appetite Loss, Chills, Fatigue, Fever, Night Sweats, Weight Gain and Weight Loss. Skin Not Present- Change in Wart/Mole, Dryness, Hives, Jaundice, New Lesions,  Non-Healing Wounds, Rash and Ulcer. HEENT Present- Seasonal Allergies. Not Present- Earache, Hearing Loss, Hoarseness, Nose Bleed, Oral Ulcers, Ringing in the Ears, Sinus Pain, Sore Throat, Visual  Disturbances, Wears glasses/contact lenses and Yellow Eyes. Respiratory Present- Snoring. Not Present- Bloody sputum, Chronic Cough, Difficulty Breathing and Wheezing. Breast Not Present- Breast Mass, Breast Pain, Nipple Discharge and Skin Changes. Cardiovascular Not Present- Chest Pain, Difficulty Breathing Lying Down, Leg Cramps, Palpitations, Rapid Heart Rate, Shortness of Breath and Swelling of Extremities. Gastrointestinal Not Present- Abdominal Pain, Bloating, Bloody Stool, Change in Bowel Habits, Chronic diarrhea, Constipation, Difficulty Swallowing, Excessive gas, Gets full quickly at meals, Hemorrhoids, Indigestion, Nausea, Rectal Pain and Vomiting. Male Genitourinary Not Present- Blood in Urine, Change in Urinary Stream, Frequency, Impotence, Nocturia, Painful Urination, Urgency and Urine Leakage.  Vitals (Alisha Spillers CMA; 10/28/2017 3:41 PM) 10/28/2017 3:40 PM Weight: 266 lb Height: 71in Body Surface Area: 2.38 m Body Mass Index: 37.1 kg/m  Pulse: 76 (Regular)  BP: 142/82 (Sitting, Left Arm, Standard)      Physical Exam [name] MD; 10/28/2017 4:32 PM)  General Mental Status-Alert. General Appearance-Not in acute distress, Not Sickly. Orientation-Oriented X3. Hydration-Well hydrated. Voice-Normal.  Integumentary Global Assessment Upon inspection and palpation of skin surfaces of the - Axillae: non-tender, no inflammation or ulceration, no drainage. and Distribution of scalp and body hair is normal. General Characteristics Temperature - normal warmth is noted.  Head and Neck Head-normocephalic, atraumatic with no lesions or palpable masses. Face Global Assessment - atraumatic, no absence of expression. Neck Global Assessment - no abnormal  movements, no bruit auscultated on the right, no bruit auscultated on the left, no decreased range of motion, non-tender. Trachea-midline. Thyroid Gland Characteristics - non-tender.  Eye Eyeball - Left-Extraocular movements intact, No Nystagmus. Eyeball - Right-Extraocular movements intact, No Nystagmus. Cornea - Left-No Hazy. Cornea - Right-No Hazy. Sclera/Conjunctiva - Left-No scleral icterus, No Discharge. Sclera/Conjunctiva - Right-No scleral icterus, No Discharge. Pupil - Left-Direct reaction to light normal. Pupil - Right-Direct reaction to light normal.  ENMT Ears Pinna - Left - no drainage observed, no generalized tenderness observed. Right - no drainage observed, no generalized tenderness observed. Nose and Sinuses External Inspection of the Nose - no destructive lesion observed. Inspection of the nares - Left - quiet respiration. Right - quiet respiration. Mouth and Throat Lips - Upper Lip - no fissures observed, no pallor noted. Lower Lip - no fissures observed, no pallor noted. Nasopharynx - no discharge present. Oral Cavity/Oropharynx - Tongue - no dryness observed. Oral Mucosa - no cyanosis observed. Hypopharynx - no evidence of airway distress observed.  Chest and Lung Exam Inspection Movements - Normal and Symmetrical. Accessory muscles - No use of accessory muscles in breathing. Palpation Palpation of the chest reveals - Non-tender. Auscultation Breath sounds - Normal and Clear.  Cardiovascular Auscultation Rhythm - Regular. Murmurs & Other Heart Sounds - Auscultation of the heart reveals - No Murmurs and No Systolic Clicks.  Abdomen Inspection Inspection of the abdomen reveals - No Visible peristalsis and No Abnormal pulsations. Umbilicus - No Bleeding, No Urine drainage. Palpation/Percussion Palpation and Percussion of the abdomen reveal - Soft, Non Tender, No Rebound tenderness, No Rigidity (guarding) and No Cutaneous  hyperesthesia. Note: Abdomen overweight but soft. Nontender. Not distended. No umbilical or incisional hernias. No guarding.  Male Genitourinary Sexual Maturity Tanner 5 - Adult hair pattern and Adult penile size and shape. Note: No inguinal hernias. Normal external genitalia. Epididymi, testes, and spermatic cords normal without any masses.  Peripheral Vascular Upper Extremity Inspection - Left - No Cyanotic nailbeds, Not Ischemic. Right - No Cyanotic nailbeds, Not Ischemic.  Neurologic Neurologic evaluation reveals -normal attention span and ability  to concentrate, able to name objects and repeat phrases. Appropriate fund of knowledge , normal sensation and normal coordination. Mental Status Affect - not angry, not paranoid. Cranial Nerves-Normal Bilaterally. Gait-Normal.  Neuropsychiatric Mental status exam performed with findings of-able to articulate well with normal speech/language, rate, volume and coherence, thought content normal with ability to perform basic computations and apply abstract reasoning and no evidence of hallucinations, delusions, obsessions or homicidal/suicidal ideation.  Musculoskeletal Global Assessment Spine, Ribs and Pelvis - no instability, subluxation or laxity. Right Upper Extremity - no instability, subluxation or laxity.  Lymphatic Head & Neck  General Head & Neck Lymphatics: Bilateral - Description - No Localized lymphadenopathy. Axillary  General Axillary Region: Bilateral - Description - No Localized lymphadenopathy. Femoral & Inguinal  Generalized Femoral & Inguinal Lymphatics: Left - Description - No Localized lymphadenopathy. Right - Description - No Localized lymphadenopathy.    Assessment & Plan [name] MD; 10/28/2017 4:34 PM)  ACUTE APPENDICITIS, UNCOMPLICATED (K35.80) Impression: Patient with obvious appendicitis treated nonoperatively with IV antibiotics given recent myocardial infarction and full  anticoagulation. So far resolve nonoperatively.  Given his young age and surgical risks in an emergent situation, I think it would be best to try and do an interval appendectomy to avoid future risk of recurrent appendicitis since this not insignificant. Nonoperative management seems to feel that our experience over the past few years. If we can do this electively off anticoagulation, his perioperative risk think will be less.  Would like clearance from his cardiologist, Dr. Excell Seltzer. I'm assuming it is ago since last note mentions okay to hold Plavix after 3 months. I'm fine with the patient continuing aspirin perioperatively. Even 325mg . Usually this is an outpatient surgery. Low threshold to have him be observed overnight if there are concerns by anesthesia and cardiology.  I strongly recommend he quit smoking at some point  Current Plans I recommended obtaining preoperative cardiac clearance for recommendations on management of anticoagualtion perioperatively:  1. Timing of holding anticoagulation 2. Need for any bridge therapy (SQ enoxaparin, IV heparin, IV Aggrastat, etc) preop/postop. 3. Desired timing of resumption of anticoagulation.  In general from Dr. Michaell Cowing' standpoint:  Aspirin is okay to continue perioperatively (81mg  or 325mg ) & does not need to be held  Hold warfarin 5 dayspreoperatively. Consider PT/INR level on arrival to short stay the day of surgery. No need to check PT/INR level on preop visit  Hold P2Y12 inibitors such as clopidrogel (Plavix) 4 dayspreoperatively  Hold direct thrombin / factor Xa inhibitors (Xerelto, Pradaxa, Eliquis, etc) 2 dayspreoperatively   Request clearance by cardiology to better assess operative risk & see if a reevaluation, further workup, etc is needed. Also recommendations on how medications such as for anticoagulation and blood pressure should be managed/held/restarted after surgery.  You are being scheduled for  surgery- Our schedulers will call you.  You should hear from our office's scheduling department within 5 working days about the location, date, and time of surgery. We try to make accommodations for patient's preferences in scheduling surgery, but sometimes the OR schedule or the surgeon's schedule prevents [place] from making those accommodations.  If you have not heard from our office [phone]) in 5 working days, call the office and ask for your surgeon's nurse.  If you have other questions about your diagnosis, plan, or surgery, call the office and ask for your surgeon's nurse.  The anatomy & physiology of the digestive tract was discussed. The pathophysiology of appendicitis and other appendiceal disorders were discussed.  Natural history risks without surgery was discussed. I feel the risks of no intervention will lead to serious problems that outweigh the operative risks; therefore, I recommended diagnostic laparoscopy with removal of appendix to remove the pathology. Laparoscopic & open techniques were discussed. I noted a good likelihood this will help address the problem. Risks such as bleeding, infection, abscess, leak, reoperation, possible ostomy, hernia, heart attack, death, and other risks were discussed. Goals of post-operative recovery were discussed as well. We will work to minimize complications. Questions were answered. The patient expresses understanding & wishes to proceed with surgery.  Pt Education - Appendicitis: discussed with patient and provided information. Pt Education - [facility] Laparosopic Post Op HCI (Batool Majid)  PREOP EXAMINATION (Z01.818)   TOBACCO ABUSE (Z72.0) Impression: STOP SMOKING!  We talked to the patient about the dangers of smoking. We stressed that tobacco use dramatically increases the risk of peri-operative complications such as infection, tissue necrosis leaving to problems with incision/wound and organ healing, hernia, chronic pain, heart  attack, stroke, DVT, pulmonary embolism, and death. We noted there are programs in our community to help stop smoking. Information was available.  Current Plans Pt Education - [facility] STOP SMOKING!  Ardeth SportsmanSteven C. Kaelan Emami, M.D., F.A.C.S. Gastrointestinal and Minimally Invasive Surgery [facility] [address] [place], [place] [id number] [phone] Main / Paging

## 2017-10-28 NOTE — H&P (Signed)
Lance [name] Documented: 10/28/2017 3:39 PM [facility] Patient #: [id number] DOB: December 22, 1975 Married / Language: English / Race: White Male  History of Present Illness [name] MD; 10/28/2017 6:13 PM) The patient is a 43 year old male who presents with appendicitis. Note for "Appendicitis": ` ` ` Patient sent for surgical consultation at the request of Dr. Tonny Bollman  Chief Complaint: Appendicitis. Need for interval appendectomy.  The patient is an obese smoking diabetic male. Had recurrent chest pain and found to have myocardial infarction. Underwent stenting in October 2018. Place on Plavix. The next month he developed acute appendicitis. Was treated with IV antibiotics nonoperatively at [facility]. He improved over a few days. Transitioned home. Concerned about need for interval appendectomy. He's been followed by Dr. Tonny Bollman. They were strongly hoping that surgery could be avoided for the first 3 months. His just past that point. Cardiology recommended surgical care [facility] with a could help manage him perioperatively.  Patient had laparoscopic cholecystectomy in [facility] in [place], [place] last year. No major issues with that that he can recall. Usually moves his bowels every day. He's cut back on his cigarettes from 2 packs a day to about 5 cigarettes a day but has not completely quit. He had similar pain and came to emergency room 12/31. Repeat CT scan in late December showed improvement and near resolution of his appendicitis. He denies much pain right now. He's never had a colonoscopy.  No personal nor family history of GI/colon cancer, inflammatory bowel disease, irritable bowel syndrome, allergy such as Celiac Sprue, dietary/dairy problems, colitis, ulcers nor gastritis. No recent sick contacts/gastroenteritis. No travel outside the country. No changes in diet. No dysphagia to solids or liquids. No  significant heartburn or reflux. No hematochezia, hematemesis, coffee ground emesis. No evidence of prior gastric/peptic ulceration.    (Review of systems as stated in this history (HPI) or in the review of systems. Otherwise all other 12 point ROS are negative)   Past Surgical History Ethlyn Gallery, CMA; 10/28/2017 3:39 PM) Gallbladder Surgery - Open  Diagnostic Studies History Elease Hashimoto Spillers, CMA; 10/28/2017 3:39 PM) Colonoscopy never  Allergies Elease Hashimoto Spillers, CMA; 10/28/2017 3:41 PM) Penicillins  Medication History (Alisha Spillers, CMA; 10/28/2017 3:43 PM) ALPRAZolam (1MG  Tablet, Oral) Active. Atorvastatin Calcium (80MG  Tablet, Oral) Active. Carvedilol (3.125MG  Tablet, Oral) Active. Clopidogrel Bisulfate (75MG  Tablet, Oral) Active. HumaLOG KwikPen (100UNIT/ML Soln Pen-inj, Subcutaneous) Active. Jardiance (10MG  Tablet, Oral) Active. Lisinopril (40MG  Tablet, Oral) Active. MetFORMIN HCl ER (500MG  Tablet ER 24HR, Oral) Active. Omeprazole (40MG  Capsule DR, Oral) Active. Fish Oil (600MG  Capsule, Oral) Active. Baby Aspirin (81MG  Tablet Chewable, Oral) Active. Ezetimibe (10MG  Tablet, Oral) Active. Medications Reconciled  Social History Ethlyn Gallery, CMA; 10/28/2017 3:39 PM) Caffeine use Carbonated beverages, Coffee. Illicit drug use Prefer to discuss with provider. No alcohol use Tobacco use Current every day smoker.  Family History Ethlyn Gallery, CMA; 10/28/2017 3:39 PM) Family history unknown First Degree Relatives  Other Problems Ethlyn Gallery, CMA; 10/28/2017 3:39 PM) Diabetes Mellitus Gastroesophageal Reflux Disease Heart murmur High blood pressure Hypercholesterolemia Kidney Stone     Review of Systems Elease Hashimoto Spillers CMA; 10/28/2017 3:39 PM) General Not Present- Appetite Loss, Chills, Fatigue, Fever, Night Sweats, Weight Gain and Weight Loss. Skin Not Present- Change in Wart/Mole, Dryness, Hives, Jaundice, New Lesions,  Non-Healing Wounds, Rash and Ulcer. HEENT Present- Seasonal Allergies. Not Present- Earache, Hearing Loss, Hoarseness, Nose Bleed, Oral Ulcers, Ringing in the Ears, Sinus Pain, Sore Throat, Visual  Disturbances, Wears glasses/contact lenses and Yellow Eyes. Respiratory Present- Snoring. Not Present- Bloody sputum, Chronic Cough, Difficulty Breathing and Wheezing. Breast Not Present- Breast Mass, Breast Pain, Nipple Discharge and Skin Changes. Cardiovascular Not Present- Chest Pain, Difficulty Breathing Lying Down, Leg Cramps, Palpitations, Rapid Heart Rate, Shortness of Breath and Swelling of Extremities. Gastrointestinal Not Present- Abdominal Pain, Bloating, Bloody Stool, Change in Bowel Habits, Chronic diarrhea, Constipation, Difficulty Swallowing, Excessive gas, Gets full quickly at meals, Hemorrhoids, Indigestion, Nausea, Rectal Pain and Vomiting. Male Genitourinary Not Present- Blood in Urine, Change in Urinary Stream, Frequency, Impotence, Nocturia, Painful Urination, Urgency and Urine Leakage.  Vitals (Alisha Spillers CMA; 10/28/2017 3:41 PM) 10/28/2017 3:40 PM Weight: 266 lb Height: 71in Body Surface Area: 2.38 m Body Mass Index: 37.1 kg/m  Pulse: 76 (Regular)  BP: 142/82 (Sitting, Left Arm, Standard)      Physical Exam [name] MD; 10/28/2017 4:32 PM)  General Mental Status-Alert. General Appearance-Not in acute distress, Not Sickly. Orientation-Oriented X3. Hydration-Well hydrated. Voice-Normal.  Integumentary Global Assessment Upon inspection and palpation of skin surfaces of the - Axillae: non-tender, no inflammation or ulceration, no drainage. and Distribution of scalp and body hair is normal. General Characteristics Temperature - normal warmth is noted.  Head and Neck Head-normocephalic, atraumatic with no lesions or palpable masses. Face Global Assessment - atraumatic, no absence of expression. Neck Global Assessment - no abnormal  movements, no bruit auscultated on the right, no bruit auscultated on the left, no decreased range of motion, non-tender. Trachea-midline. Thyroid Gland Characteristics - non-tender.  Eye Eyeball - Left-Extraocular movements intact, No Nystagmus. Eyeball - Right-Extraocular movements intact, No Nystagmus. Cornea - Left-No Hazy. Cornea - Right-No Hazy. Sclera/Conjunctiva - Left-No scleral icterus, No Discharge. Sclera/Conjunctiva - Right-No scleral icterus, No Discharge. Pupil - Left-Direct reaction to light normal. Pupil - Right-Direct reaction to light normal.  ENMT Ears Pinna - Left - no drainage observed, no generalized tenderness observed. Right - no drainage observed, no generalized tenderness observed. Nose and Sinuses External Inspection of the Nose - no destructive lesion observed. Inspection of the nares - Left - quiet respiration. Right - quiet respiration. Mouth and Throat Lips - Upper Lip - no fissures observed, no pallor noted. Lower Lip - no fissures observed, no pallor noted. Nasopharynx - no discharge present. Oral Cavity/Oropharynx - Tongue - no dryness observed. Oral Mucosa - no cyanosis observed. Hypopharynx - no evidence of airway distress observed.  Chest and Lung Exam Inspection Movements - Normal and Symmetrical. Accessory muscles - No use of accessory muscles in breathing. Palpation Palpation of the chest reveals - Non-tender. Auscultation Breath sounds - Normal and Clear.  Cardiovascular Auscultation Rhythm - Regular. Murmurs & Other Heart Sounds - Auscultation of the heart reveals - No Murmurs and No Systolic Clicks.  Abdomen Inspection Inspection of the abdomen reveals - No Visible peristalsis and No Abnormal pulsations. Umbilicus - No Bleeding, No Urine drainage. Palpation/Percussion Palpation and Percussion of the abdomen reveal - Soft, Non Tender, No Rebound tenderness, No Rigidity (guarding) and No Cutaneous  hyperesthesia. Note: Abdomen overweight but soft. Nontender. Not distended. No umbilical or incisional hernias. No guarding.  Male Genitourinary Sexual Maturity Tanner 5 - Adult hair pattern and Adult penile size and shape. Note: No inguinal hernias. Normal external genitalia. Epididymi, testes, and spermatic cords normal without any masses.  Peripheral Vascular Upper Extremity Inspection - Left - No Cyanotic nailbeds, Not Ischemic. Right - No Cyanotic nailbeds, Not Ischemic.  Neurologic Neurologic evaluation reveals -normal attention span and ability  to concentrate, able to name objects and repeat phrases. Appropriate fund of knowledge , normal sensation and normal coordination. Mental Status Affect - not angry, not paranoid. Cranial Nerves-Normal Bilaterally. Gait-Normal.  Neuropsychiatric Mental status exam performed with findings of-able to articulate well with normal speech/language, rate, volume and coherence, thought content normal with ability to perform basic computations and apply abstract reasoning and no evidence of hallucinations, delusions, obsessions or homicidal/suicidal ideation.  Musculoskeletal Global Assessment Spine, Ribs and Pelvis - no instability, subluxation or laxity. Right Upper Extremity - no instability, subluxation or laxity.  Lymphatic Head & Neck  General Head & Neck Lymphatics: Bilateral - Description - No Localized lymphadenopathy. Axillary  General Axillary Region: Bilateral - Description - No Localized lymphadenopathy. Femoral & Inguinal  Generalized Femoral & Inguinal Lymphatics: Left - Description - No Localized lymphadenopathy. Right - Description - No Localized lymphadenopathy.    Assessment & Plan [name] MD; 10/28/2017 4:34 PM)  ACUTE APPENDICITIS, UNCOMPLICATED (K35.80) Impression: Patient with obvious appendicitis treated nonoperatively with IV antibiotics given recent myocardial infarction and full  anticoagulation. So far resolve nonoperatively.  Given his young age and surgical risks in an emergent situation, I think it would be best to try and do an interval appendectomy to avoid future risk of recurrent appendicitis since this not insignificant. Nonoperative management seems to feel that our experience over the past few years. If we can do this electively off anticoagulation, his perioperative risk think will be less.  Would like clearance from his cardiologist, Dr. Excell Seltzer. I'm assuming it is ago since last note mentions okay to hold Plavix after 3 months. I'm fine with the patient continuing aspirin perioperatively. Even 325mg . Usually this is an outpatient surgery. Low threshold to have him be observed overnight if there are concerns by anesthesia and cardiology.  I strongly recommend he quit smoking at some point  Current Plans I recommended obtaining preoperative cardiac clearance for recommendations on management of anticoagualtion perioperatively:  1. Timing of holding anticoagulation 2. Need for any bridge therapy (SQ enoxaparin, IV heparin, IV Aggrastat, etc) preop/postop. 3. Desired timing of resumption of anticoagulation.  In general from Dr. Michaell Cowing' standpoint:  Aspirin is okay to continue perioperatively (81mg  or 325mg ) & does not need to be held  Hold warfarin 5 dayspreoperatively. Consider PT/INR level on arrival to short stay the day of surgery. No need to check PT/INR level on preop visit  Hold P2Y12 inibitors such as clopidrogel (Plavix) 4 dayspreoperatively  Hold direct thrombin / factor Xa inhibitors (Xerelto, Pradaxa, Eliquis, etc) 2 dayspreoperatively   Request clearance by cardiology to better assess operative risk & see if a reevaluation, further workup, etc is needed. Also recommendations on how medications such as for anticoagulation and blood pressure should be managed/held/restarted after surgery.  You are being scheduled for  surgery- Our schedulers will call you.  You should hear from our office's scheduling department within 5 working days about the location, date, and time of surgery. We try to make accommodations for patient's preferences in scheduling surgery, but sometimes the OR schedule or the surgeon's schedule prevents [place] from making those accommodations.  If you have not heard from our office [phone]) in 5 working days, call the office and ask for your surgeon's nurse.  If you have other questions about your diagnosis, plan, or surgery, call the office and ask for your surgeon's nurse.  The anatomy & physiology of the digestive tract was discussed. The pathophysiology of appendicitis and other appendiceal disorders were discussed.  Natural history risks without surgery was discussed. I feel the risks of no intervention will lead to serious problems that outweigh the operative risks; therefore, I recommended diagnostic laparoscopy with removal of appendix to remove the pathology. Laparoscopic & open techniques were discussed. I noted a good likelihood this will help address the problem. Risks such as bleeding, infection, abscess, leak, reoperation, possible ostomy, hernia, heart attack, death, and other risks were discussed. Goals of post-operative recovery were discussed as well. We will work to minimize complications. Questions were answered. The patient expresses understanding & wishes to proceed with surgery.  Pt Education - Appendicitis: discussed with patient and provided information. Pt Education - [facility] Laparosopic Post Op HCI (Melika Reder)  PREOP EXAMINATION (Z01.818)   TOBACCO ABUSE (Z72.0) Impression: STOP SMOKING!  We talked to the patient about the dangers of smoking. We stressed that tobacco use dramatically increases the risk of peri-operative complications such as infection, tissue necrosis leaving to problems with incision/wound and organ healing, hernia, chronic pain, heart  attack, stroke, DVT, pulmonary embolism, and death. We noted there are programs in our community to help stop smoking. Information was available.  Current Plans Pt Education - [facility] STOP SMOKING!  Ardeth SportsmanSteven C. Naoko Diperna, M.D., F.A.C.S. Gastrointestinal and Minimally Invasive Surgery [facility] [address] [place], [place] [id number] [phone] Main / Paging

## 2017-11-03 ENCOUNTER — Telehealth: Payer: Self-pay | Admitting: *Deleted

## 2017-11-03 NOTE — Telephone Encounter (Signed)
  I will route this recommendation to the requesting party via Epic fax function and remove from pre-op pool.  Please call with questions.  Fair LakesBhavinkumar Taige Housman, [place] 11/03/2017, 3:55 PM

## 2017-11-03 NOTE — Telephone Encounter (Signed)
    [facility] Pre-operative Risk Assessment    Request for surgical clearance:  1. What type of surgery is being performed? Lap appendectomy   2. When is this surgery scheduled? Not scheduled. Needs written clearance in order to get a surgery date. The patient is interested in having surgery ASAP.   3. What type of clearance is required (medical clearance vs. Pharmacy clearance to hold med vs. Both)? Medical  4. Are there any medications that need to be held prior to surgery and how long?Plavix--need instructions as to how the patient should hold medication preoperatively.   5. Practice name and name of physician performing surgery? Dr. Johney Maine. [facility]   6. What is your office phone and fax number? Phone-[phone]. Fax-[phone]--Attention: Illene Regulus, CMA   7. Anesthesia type (None, local, MAC, general) ? General     _________________________________________________________________   (provider comments below)  Pt is scheduled to see Dr. Domenic Polite on 11/09/17

## 2017-11-03 NOTE — Telephone Encounter (Signed)
Yes this is ok. Hold plavix x 5 days then resume post-operatively as soon as it is safe to do so. Continue ASA without interruption. thanks

## 2017-11-03 NOTE — Telephone Encounter (Signed)
   Primary Cardiologist: Nona DellSamuel McDowell, MD  Chart reviewed as part of pre-operative protocol coverage. Patient was contacted 11/03/2017 in reference to pre-operative risk assessment for pending surgery as outlined below.  Joellyn HaffJustin C Winner was last seen on 09/22/17  by Dr. Diona BrownerMcDowell.  Since that day, Joellyn HaffJustin C Goold has done well. DASI is 7.25 Mets.   Dr. Excell Seltzerooper previously has recommended to "He had 2 vessel PCI 10-10 electively (no ACS). If he can get to 3 months from PCI (10-15-17), it would be reasonable to hold clopidogrel for surgery and resume post-operatively as soon as it is safe to do so. He should continue on ASA 81 mg without interruption".  Therefore, based on ACC/AHA guidelines, the patient would be at acceptable risk for the planned procedure without further cardiovascular testing.   Dr. Excell Seltzerooper is it ok to hold Plavix for 5-7 days prior to surgery?   StorrsBhavinkumar Stephany Poorman, [place] 11/03/2017, 3:23 PM

## 2017-11-04 NOTE — Progress Notes (Signed)
Cardiology Office Note  Date: 11/05/2017   ID: Lance [name], DOB Jun 07, 1976, MRN [id number]  PCP: [facility]  Primary Cardiologist: Lance Lesches, MD   Chief Complaint  Patient presents with  . Cardiac follow-up    History of Present Illness: Lance [name] is a 42 y.o. male last seen in December 2018.  Please refer to previous note for discussion of history.  Since I last saw him he has been evaluated by Dr. Johney [name] with plan for a laparoscopic appendectomy.  Case was reviewed with Dr. Burt [name] regarding preoperative cardiac assessment and earliest reasonable time to stop Plavix.  It was recommended that he wait at least 3 months after DES intervention if possible, and he has now met this goal.  He presents today for a follow-up visit, denies any angina symptoms and states that he has been taking his medications regularly.  He does have recurring abdominal pain.  Past Medical History:  Diagnosis Date  . CAD (coronary artery disease), native coronary artery    10/18 PCI/DES to mRCA, and OM, with total occlusion of dLCx with collaterals.   . Essential hypertension   . GERD (gastroesophageal reflux disease)   . History of kidney stones   . Hyperlipemia   . SVT (supraventricular tachycardia) ([place]) 2015   Converted with adenosine  . Type 2 diabetes mellitus ([place])     Past Surgical History:  Procedure Laterality Date  . CORONARY ANGIOPLASTY WITH STENT PLACEMENT  07/15/2017   "2 stents"  . LAPAROSCOPIC CHOLECYSTECTOMY    . LEFT HEART CATH AND CORONARY ANGIOGRAPHY N/A 07/15/2017   Procedure: LEFT HEART CATH AND CORONARY ANGIOGRAPHY;  Surgeon: Sherren Mocha, MD;  Location: [facility];  Service: Cardiovascular;  Laterality: N/A;    Current Outpatient Medications  Medication Sig Dispense Refill  . ALPRAZolam (XANAX) 1 MG tablet Take 1 mg by mouth daily as needed for anxiety.    Marland Kitchen aspirin EC 81 MG tablet Take 81 mg by mouth daily.    Marland Kitchen atorvastatin  (LIPITOR) 80 MG tablet Take 1 tablet (80 mg total) by mouth daily. 30 tablet 0  . carvedilol (COREG) 3.125 MG tablet Take 1 tablet (3.125 mg total) by mouth 2 (two) times daily. 180 tablet 3  . clopidogrel (PLAVIX) 75 MG tablet Take 1 tablet (75 mg total) by mouth daily with breakfast. 90 tablet 2  . docusate sodium (COLACE) 100 MG capsule Take 1 capsule (100 mg total) by mouth 2 (two) times daily. 10 capsule 0  . ezetimibe (ZETIA) 10 MG tablet Take 1 tablet (10 mg total) by mouth daily. 30 tablet 0  . Insulin Detemir (LEVEMIR FLEXPEN) 100 UNIT/ML Pen Inject 60 Units into the skin daily at 10 pm. 5 pen 2  . lisinopril (PRINIVIL,ZESTRIL) 40 MG tablet Take 40 mg by mouth daily.    . metFORMIN (GLUCOPHAGE) 500 MG tablet Take 1,000 mg by mouth 2 (two) times daily with a meal.     . nitroGLYCERIN (NITROSTAT) 0.4 MG SL tablet Place 1 tablet (0.4 mg total) under the tongue every 5 (five) minutes x 3 doses as needed for chest pain. 15 tablet 0  . Omega-3 1000 MG CAPS Take 1,000 mg by mouth 2 (two) times daily.     No current facility-administered medications for this visit.    Allergies:  Bee venom and Penicillins   Social History: The patient  reports that he has been smoking cigarettes.  He has a 6.50 pack-year smoking history. he  has never used smokeless tobacco. He reports that he does not drink alcohol or use drugs.   ROS:  Please see the history of present illness. Otherwise, complete review of systems is positive for intermittent abdominal pain.  All other systems are reviewed and negative.   Physical Exam: VS:  BP 116/78   Pulse 86   Ht _0  (1.803 m)   Wt 268 lb (121.6 kg)   SpO2 95%   BMI 37.38 kg/m , BMI Body mass index is 37.38 kg/m.  Wt Readings from Last 3 Encounters:  11/05/17 268 lb (121.6 kg)  10/20/17 266 lb (120.7 kg)  10/20/17 266 lb (120.7 kg)    General: Obese male, appears comfortable at rest. HEENT: Conjunctiva and lids normal, oropharynx clear. Neck: Supple, no  elevated JVP or carotid bruits, no thyromegaly. Lungs: Clear to auscultation, nonlabored breathing at rest. Cardiac: Regular rate and rhythm, no S3, soft systolic murmur. Abdomen: Nontender, bowel sounds present, no guarding or rebound. Extremities: No pitting edema, distal pulses 2+. Skin: Warm and dry. Musculoskeletal: No kyphosis. Neuropsychiatric: Alert and oriented x3, affect grossly appropriate.  ECG: I personally reviewed the tracing from 09/04/2017 which showed normal sinus rhythm.  Recent Labwork: 05/10/2017: Magnesium 1.9 10/05/2017: ALT 48; AST 32; BUN 17; Creatinine, Ser 0.98; Hemoglobin 14.7; Platelets 193; Potassium 3.8; Sodium 140 10/10/2017: TSH 2.85     Component Value Date/Time   CHOL 88 10/10/2017 0929   TRIG 127 10/10/2017 0929   HDL 23 (L) 10/10/2017 0929   CHOLHDL 3.8 10/10/2017 0929   VLDL UNABLE TO CALCULATE IF TRIGLYCERIDE OVER 400 mg/dL 05/11/2017 0415   LDLCALC UNABLE TO CALCULATE IF TRIGLYCERIDE OVER 400 mg/dL 05/11/2017 0415    Other Studies Reviewed Today:  Cardiac catheterization and PCI 07/15/2017: Conclusion   1. Three vessel CAD  Moderate eccentric proximal LAD stenosis with normal FFR analysis  Severe LCx/OM stenosis treated successfully with a Svelte DES through the Optimize clinical trial  Severe RCA stenosis (hemodynamically significant by FFR) treated successfully with a Svelte DES through the Optimize clinical trial 2. Normal/vigorous LV function with LVEF >65%  DAPT with ASA and plavix x 12 months minimum.   **Pt blinded to stent type**   Echocardiogram 05/11/2017: Study Conclusions  - Left ventricle: The cavity size was normal. Wall thickness was normal. Systolic function was normal. The estimated ejection fraction was in the range of 55% to 60%. Left ventricular diastolic function parameters were normal. - Aortic valve: Calcified non coronary cusp. Valve area (VTI): 2.28 cm^2. Valve area (Vmax): 2.26 cm^2. Valve area  (Vmean): 2.37 cm^2. - Left atrium: The atrium was moderately dilated. - Atrial septum: No defect or patent foramen ovale was identified.  Assessment and Plan:  1.  Preoperative assessment in the setting of CAD status post DES to the circumflex/OM as well as RCA on October 10, not in the setting of ACS.  He is clinically stable without angina on medical therapy.  Case reviewed with Dr. Burt [name] and at this point patient is greater than 3 months out from intervention, he may hold Plavix 5 days prior to laparoscopic cholecystectomy and then resume postoperatively as soon as it is safe to do so.  He should continue aspirin throughout.  2.  Mixed hyperlipidemia with severe hypertriglyceridemia.  He is currently on Lipitor, Zetia, and omega-3 supplements.  3.  Type 2 diabetes mellitus, followed by Dr. Dorris Fetch.  4.  Essential hypertension, blood pressure control is good today.  Current medicines were reviewed with the  patient today.  Disposition: Follow-up in 3 months.  Signed, Satira Sark, MD, [facility] 11/05/2017 2:07 PM    [place] [facility] [address], [place],  [address] Phone: [phone]; Fax: [phone]

## 2017-11-05 ENCOUNTER — Ambulatory Visit (INDEPENDENT_AMBULATORY_CARE_PROVIDER_SITE_OTHER): Payer: 59 | Admitting: Cardiology

## 2017-11-05 ENCOUNTER — Encounter: Payer: Self-pay | Admitting: Cardiology

## 2017-11-05 VITALS — BP 116/78 | HR 86 | Ht 71.0 in | Wt 268.0 lb

## 2017-11-05 DIAGNOSIS — E1165 Type 2 diabetes mellitus with hyperglycemia: Secondary | ICD-10-CM

## 2017-11-05 DIAGNOSIS — Z01810 Encounter for preprocedural cardiovascular examination: Secondary | ICD-10-CM

## 2017-11-05 DIAGNOSIS — E782 Mixed hyperlipidemia: Secondary | ICD-10-CM

## 2017-11-05 DIAGNOSIS — I25119 Atherosclerotic heart disease of native coronary artery with unspecified angina pectoris: Secondary | ICD-10-CM | POA: Diagnosis not present

## 2017-11-05 DIAGNOSIS — I10 Essential (primary) hypertension: Secondary | ICD-10-CM | POA: Diagnosis not present

## 2017-11-05 NOTE — Patient Instructions (Signed)
Your physician recommends that you schedule a follow-up appointment in: 3 months with Dr.McDowell   Your physician recommends that you continue on your current medications as directed. Please refer to the Current Medication list given to you today.   If you need a refill on your cardiac medications before your next appointment, please call your pharmacy.   I will fax your clearance to your doctor     No tests or lab work ordered.      Thank you for choosing [facility] !

## 2017-11-09 ENCOUNTER — Ambulatory Visit: Payer: 59 | Admitting: Cardiology

## 2017-11-10 NOTE — Progress Notes (Signed)
Lov/cardiac clearance Dr Nona DellSamuel [name] 11-05-17 epic   hgba1c 10-10-17 epic   EKG 09-04-17 epic   CXR 09-04-17 epic  ECHO 05-11-17 epic   Stress Test 09-01-16 epic

## 2017-11-10 NOTE — Patient Instructions (Addendum)
Lance [name]  11/10/2017   Your procedure is scheduled on: 11-12-17   Report to [facility] Main  Entrance    Follow signs to Short Stay on first floor at 530 AM   Call this number if you have problems the morning of surgery [phone]     Remember: NO SOLID FOOD AFTER MIDNIGHT THE NIGHT PRIOR TO SURGERY. NOTHING BY MOUTH EXCEPT CLEAR LIQUIDS UNTIL 3 HOURS PRIOR TO SCHEDULED SURGERY. PLEASE FINISH ENSURE DRINK PER SURGEON ORDER 3 HOURS PRIOR TO SCHEDULED SURGERY TIME WHICH NEEDS TO BE COMPLETED AT ___4:30AM____.     Take these medicines the morning of surgery with A SIP OF WATER: Carvedilol, Ezetimibe(zetia), Xanax if needed, Aspirin (per MD's instructions)                                You may not have any metal on your body including hair pins and              piercings  Do not wear jewelry, make-up, lotions, powders or perfumes, deodorant                        Men may shave face and neck.   Do not bring valuables to the hospital. [facility] IS NOT             RESPONSIBLE   FOR VALUABLES.  Contacts, dentures or bridgework may not be worn into surgery.  Leave suitcase in the car. After surgery it may be brought to your room.                 Please read over the following fact sheets you were given: _____________________________________________________________________     CLEAR LIQUID DIET   Foods Allowed                                                                     Foods Excluded  Coffee and tea, regular and decaf                             liquids that you cannot  Plain Jell-O in any flavor                                             see through such as: Fruit ices (not with fruit pulp)                                     milk, soups, orange juice  Iced Popsicles                                    All solid food Carbonated beverages, regular and diet  Cranberry, grape and apple juices Sports drinks  like Gatorade Lightly seasoned clear broth or consume(fat free) Sugar, honey syrup  Sample Menu Breakfast                                Lunch                                     Supper Cranberry juice                    Beef broth                            Chicken broth Jell-O                                     Grape juice                           Apple juice Coffee or tea                        Jell-O                                      Popsicle                                                Coffee or tea                        Coffee or tea  _____________________________________________________________________   How to Manage Your Diabetes Before and After Surgery  Why is it important to control my blood sugar before and after surgery? . Improving blood sugar levels before and after surgery helps healing and can limit problems. . A way of improving blood sugar control is eating a healthy diet by: o  Eating less sugar and carbohydrates o  Increasing activity/exercise o  Talking with your doctor about reaching your blood sugar goals . High blood sugars (greater than 180 mg/dL) can raise your risk of infections and slow your recovery, so you will need to focus on controlling your diabetes during the weeks before surgery. . Make sure that the doctor who takes care of your diabetes knows about your planned surgery including the date and location.  How do I manage my blood sugar before surgery? . Check your blood sugar at least 4 times a day, starting 2 days before surgery, to make sure that the level is not too high or low. o Check your blood sugar the morning of your surgery when you wake up and every 2 hours until you get to the Short Stay unit. . If your blood sugar is less than 70 mg/dL, you will need to treat for low blood sugar: o Do not take insulin. o Treat a low blood sugar (less than 70 mg/dL) with  cup of clear juice (cranberry or apple), 4 glucose tablets, OR glucose  gel. o Recheck blood sugar in 15 minutes after treatment (to make sure it is greater than 70 mg/dL). If your blood sugar is not greater than 70 mg/dL on recheck, call [phone] for further instructions. . Report your blood sugar to the short stay nurse when you get to Short Stay.  . If you are admitted to the hospital after surgery: o Your blood sugar will be checked by the staff and you will probably be given insulin after surgery (instead of oral diabetes medicines) to make sure you have good blood sugar levels. o The goal for blood sugar control after surgery is 80-180 mg/dL.   WHAT DO I DO ABOUT MY DIABETES MEDICATION?   . THE DAY BEFORE SURGERY o Take Metformin as normal  o Only take 30 units of LEVEMIR insulin at bedtime       . THE MORNING OF SURGERY, DO NOT TAKE METFORMIN     Patient Signature:  Date:   Nurse Signature:  Date:   Reviewed and Endorsed by [facility] Patient Education Committee, August 2015   [facility] - Preparing for Surgery Before surgery, you can play an important role.  Because skin is not sterile, your skin needs to be as free of germs as possible.  You can reduce the number of germs on your skin by washing with CHG (chlorahexidine gluconate) soap before surgery.  CHG is an antiseptic cleaner which kills germs and bonds with the skin to continue killing germs even after washing. Please DO NOT use if you have an allergy to CHG or antibacterial soaps.  If your skin becomes reddened/irritated stop using the CHG and inform your nurse when you arrive at Short Stay. Do not shave (including legs and underarms) for at least 48 hours prior to the first CHG shower.  You may shave your face/neck. Please follow these instructions carefully:  1.  Shower with CHG Soap the night before surgery and the  morning of Surgery.  2.  If you choose to wash your hair, wash your hair first as usual with your  normal  shampoo.  3.  After you shampoo, rinse your hair and body  thoroughly to remove the  shampoo.                           4.  Use CHG as you would any other liquid soap.  You can apply chg directly  to the skin and wash                       Gently with a scrungie or clean washcloth.  5.  Apply the CHG Soap to your body ONLY FROM THE NECK DOWN.   Do not use on face/ open                           Wound or open sores. Avoid contact with eyes, ears mouth and genitals (private parts).                       Wash face,  Genitals (private parts) with your normal soap.             6.  Wash thoroughly, paying special attention to the area where your surgery  will be performed.  7.  Thoroughly rinse your body with warm water from the neck down.  8.  DO NOT shower/wash with  your normal soap after using and rinsing off  the CHG Soap.                9.  Pat yourself dry with a clean towel.            10.  Wear clean pajamas.            11.  Place clean sheets on your bed the night of your first shower and do not  sleep with pets. Day of Surgery : Do not apply any lotions/deodorants the morning of surgery.  Please wear clean clothes to the hospital/surgery center.  FAILURE TO FOLLOW THESE INSTRUCTIONS MAY RESULT IN THE CANCELLATION OF YOUR SURGERY PATIENT SIGNATURE_________________________________  NURSE SIGNATURE__________________________________  ________________________________________________________________________

## 2017-11-11 ENCOUNTER — Encounter (HOSPITAL_COMMUNITY): Payer: Self-pay

## 2017-11-11 ENCOUNTER — Other Ambulatory Visit: Payer: Self-pay

## 2017-11-11 ENCOUNTER — Encounter (HOSPITAL_COMMUNITY)
Admission: RE | Admit: 2017-11-11 | Discharge: 2017-11-11 | Disposition: A | Discharge status: Home / Self Care | Payer: 59 | Source: Ambulatory Visit | Attending: Surgery | Admitting: Surgery

## 2017-11-11 DIAGNOSIS — Z9049 Acquired absence of other specified parts of digestive tract: Secondary | ICD-10-CM | POA: Diagnosis not present

## 2017-11-11 DIAGNOSIS — Z955 Presence of coronary angioplasty implant and graft: Secondary | ICD-10-CM | POA: Diagnosis not present

## 2017-11-11 DIAGNOSIS — Z9889 Other specified postprocedural states: Secondary | ICD-10-CM | POA: Diagnosis not present

## 2017-11-11 DIAGNOSIS — F17210 Nicotine dependence, cigarettes, uncomplicated: Secondary | ICD-10-CM | POA: Diagnosis not present

## 2017-11-11 DIAGNOSIS — Z87442 Personal history of urinary calculi: Secondary | ICD-10-CM | POA: Diagnosis not present

## 2017-11-11 DIAGNOSIS — Z7982 Long term (current) use of aspirin: Secondary | ICD-10-CM | POA: Diagnosis not present

## 2017-11-11 DIAGNOSIS — E118 Type 2 diabetes mellitus with unspecified complications: Secondary | ICD-10-CM | POA: Diagnosis not present

## 2017-11-11 DIAGNOSIS — I2510 Atherosclerotic heart disease of native coronary artery without angina pectoris: Secondary | ICD-10-CM | POA: Diagnosis not present

## 2017-11-11 DIAGNOSIS — Z833 Family history of diabetes mellitus: Secondary | ICD-10-CM | POA: Diagnosis not present

## 2017-11-11 DIAGNOSIS — Z7902 Long term (current) use of antithrombotics/antiplatelets: Secondary | ICD-10-CM | POA: Diagnosis not present

## 2017-11-11 DIAGNOSIS — K219 Gastro-esophageal reflux disease without esophagitis: Secondary | ICD-10-CM | POA: Diagnosis not present

## 2017-11-11 DIAGNOSIS — Z8249 Family history of ischemic heart disease and other diseases of the circulatory system: Secondary | ICD-10-CM | POA: Diagnosis not present

## 2017-11-11 DIAGNOSIS — Z6835 Body mass index (BMI) 35.0-35.9, adult: Secondary | ICD-10-CM | POA: Diagnosis not present

## 2017-11-11 DIAGNOSIS — I471 Supraventricular tachycardia: Secondary | ICD-10-CM | POA: Diagnosis not present

## 2017-11-11 DIAGNOSIS — K36 Other appendicitis: Secondary | ICD-10-CM | POA: Diagnosis not present

## 2017-11-11 DIAGNOSIS — E782 Mixed hyperlipidemia: Secondary | ICD-10-CM | POA: Diagnosis not present

## 2017-11-11 DIAGNOSIS — I252 Old myocardial infarction: Secondary | ICD-10-CM | POA: Diagnosis not present

## 2017-11-11 DIAGNOSIS — Z79899 Other long term (current) drug therapy: Secondary | ICD-10-CM | POA: Diagnosis not present

## 2017-11-11 DIAGNOSIS — Z880 Allergy status to penicillin: Secondary | ICD-10-CM | POA: Diagnosis not present

## 2017-11-11 DIAGNOSIS — I10 Essential (primary) hypertension: Secondary | ICD-10-CM | POA: Diagnosis not present

## 2017-11-11 DIAGNOSIS — K430 Incisional hernia with obstruction, without gangrene: Secondary | ICD-10-CM | POA: Diagnosis not present

## 2017-11-11 DIAGNOSIS — Z8349 Family history of other endocrine, nutritional and metabolic diseases: Secondary | ICD-10-CM | POA: Diagnosis not present

## 2017-11-11 DIAGNOSIS — Z794 Long term (current) use of insulin: Secondary | ICD-10-CM | POA: Diagnosis not present

## 2017-11-11 LAB — BASIC METABOLIC PANEL
Anion gap: 7 (ref 5–15)
BUN: 15 mg/dL (ref 6–20)
CALCIUM: 9.4 mg/dL (ref 8.9–10.3)
CO2: 28 mmol/L (ref 22–32)
CREATININE: 0.94 mg/dL (ref 0.61–1.24)
Chloride: 105 mmol/L (ref 101–111)
GFR calc Af Amer: >60 mL/min (ref >60)
GFR calc non Af Amer: >60 mL/min (ref >60)
GLUCOSE: 125 mg/dL — AB (ref 65–99)
Potassium: 4.5 mmol/L (ref 3.5–5.1)
Sodium: 140 mmol/L (ref 135–145)

## 2017-11-11 LAB — CBC
HCT: 44.7 % (ref 39.0–52.0)
Hemoglobin: 15 g/dL (ref 13.0–17.0)
MCH: 29.7 pg (ref 26.0–34.0)
MCHC: 33.6 g/dL (ref 30.0–36.0)
MCV: 88.5 fL (ref 78.0–100.0)
PLATELETS: 223 10*3/uL (ref 150–400)
RBC: 5.05 MIL/uL (ref 4.22–5.81)
RDW: 13.7 % (ref 11.5–15.5)
WBC: 13.3 10*3/uL — ABNORMAL HIGH (ref 4.0–10.5)

## 2017-11-11 LAB — GLUCOSE, CAPILLARY: GLUCOSE-CAPILLARY: 120 mg/dL — AB (ref 65–99)

## 2017-11-11 MED ORDER — CLINDAMYCIN PHOSPHATE 900 MG/50ML IV SOLN
900.0000 mg | INTRAVENOUS | Status: AC
  Administered 2017-11-12: 900 mg via INTRAVENOUS
  Filled 2017-11-11: qty 50

## 2017-11-11 MED ORDER — GENTAMICIN SULFATE 40 MG/ML IJ SOLN
5.0000 mg/kg | INTRAVENOUS | Status: AC
  Administered 2017-11-12: 469.2 mg via INTRAVENOUS
  Filled 2017-11-11: qty 11.75

## 2017-11-11 NOTE — Anesthesia Preprocedure Evaluation (Addendum)
Anesthesia Evaluation  Patient identified by MRN, date of birth, ID band Patient awake    Reviewed: Allergy & Precautions, NPO status , Patient's Chart, lab work & pertinent test results, reviewed documented beta blocker date and time   Airway Mallampati: II  TM Distance: >3 FB Neck ROM: Limited    Dental  (+) Chipped,    Pulmonary neg pulmonary ROS, Current Smoker,    Pulmonary exam normal breath sounds clear to auscultation       Cardiovascular hypertension, Pt. on home beta blockers and Pt. on medications + CAD  Normal cardiovascular exam Rhythm:Regular Rate:Normal     Neuro/Psych negative neurological ROS  negative psych ROS   GI/Hepatic negative GI ROS, Neg liver ROS, GERD  ,  Endo/Other  negative endocrine ROSdiabetes  Renal/GU negative Renal ROS     Musculoskeletal negative musculoskeletal ROS (+)   Abdominal (+) + obese,   Peds  Hematology negative hematology ROS (+)   Anesthesia Other Findings   Reproductive/Obstetrics                            Anesthesia Physical Anesthesia Plan  ASA: III  Anesthesia Plan: General   Post-op Pain Management:    Induction: Intravenous  PONV Risk Score and Plan: 2 and Ondansetron and Dexamethasone  Airway Management Planned: Oral ETT  Additional Equipment:   Intra-op Plan:   Post-operative Plan: Extubation in OR  Informed Consent: I have reviewed the patients History and Physical, chart, labs and discussed the procedure including the risks, benefits and alternatives for the proposed anesthesia with the patient or authorized representative who has indicated his/her understanding and acceptance.   Dental advisory given  Plan Discussed with: CRNA  Anesthesia Plan Comments:         Anesthesia Quick Evaluation

## 2017-11-11 NOTE — Progress Notes (Signed)
   11/11/17 1631  OBSTRUCTIVE SLEEP APNEA  Score 5 or greater  Results sent to PCP

## 2017-11-12 ENCOUNTER — Ambulatory Visit (HOSPITAL_COMMUNITY): Payer: 59 | Admitting: Certified Registered Nurse Anesthetist

## 2017-11-12 ENCOUNTER — Encounter (HOSPITAL_COMMUNITY)
Admission: RE | Disposition: A | Discharge status: Home / Self Care | Payer: Self-pay | Source: Ambulatory Visit | Attending: Surgery

## 2017-11-12 ENCOUNTER — Encounter (HOSPITAL_COMMUNITY): Payer: Self-pay | Admitting: Emergency Medicine

## 2017-11-12 ENCOUNTER — Observation Stay (HOSPITAL_COMMUNITY)
Admission: RE | Admit: 2017-11-12 | Discharge: 2017-11-13 | Disposition: A | Discharge status: Home / Self Care | Payer: 59 | Source: Ambulatory Visit | Attending: Surgery | Admitting: Surgery

## 2017-11-12 DIAGNOSIS — Z7902 Long term (current) use of antithrombotics/antiplatelets: Secondary | ICD-10-CM | POA: Insufficient documentation

## 2017-11-12 DIAGNOSIS — Z8349 Family history of other endocrine, nutritional and metabolic diseases: Secondary | ICD-10-CM | POA: Insufficient documentation

## 2017-11-12 DIAGNOSIS — E782 Mixed hyperlipidemia: Secondary | ICD-10-CM | POA: Insufficient documentation

## 2017-11-12 DIAGNOSIS — K219 Gastro-esophageal reflux disease without esophagitis: Secondary | ICD-10-CM | POA: Insufficient documentation

## 2017-11-12 DIAGNOSIS — F17200 Nicotine dependence, unspecified, uncomplicated: Secondary | ICD-10-CM | POA: Diagnosis present

## 2017-11-12 DIAGNOSIS — Z9049 Acquired absence of other specified parts of digestive tract: Secondary | ICD-10-CM | POA: Insufficient documentation

## 2017-11-12 DIAGNOSIS — Z6835 Body mass index (BMI) 35.0-35.9, adult: Secondary | ICD-10-CM | POA: Insufficient documentation

## 2017-11-12 DIAGNOSIS — I2510 Atherosclerotic heart disease of native coronary artery without angina pectoris: Secondary | ICD-10-CM | POA: Diagnosis present

## 2017-11-12 DIAGNOSIS — I10 Essential (primary) hypertension: Secondary | ICD-10-CM | POA: Diagnosis present

## 2017-11-12 DIAGNOSIS — K3580 Unspecified acute appendicitis: Secondary | ICD-10-CM | POA: Diagnosis not present

## 2017-11-12 DIAGNOSIS — I471 Supraventricular tachycardia: Secondary | ICD-10-CM | POA: Insufficient documentation

## 2017-11-12 DIAGNOSIS — Z8249 Family history of ischemic heart disease and other diseases of the circulatory system: Secondary | ICD-10-CM | POA: Insufficient documentation

## 2017-11-12 DIAGNOSIS — K430 Incisional hernia with obstruction, without gangrene: Secondary | ICD-10-CM | POA: Insufficient documentation

## 2017-11-12 DIAGNOSIS — F17210 Nicotine dependence, cigarettes, uncomplicated: Secondary | ICD-10-CM | POA: Insufficient documentation

## 2017-11-12 DIAGNOSIS — IMO0002 Reserved for concepts with insufficient information to code with codable children: Secondary | ICD-10-CM | POA: Diagnosis present

## 2017-11-12 DIAGNOSIS — Z79899 Other long term (current) drug therapy: Secondary | ICD-10-CM | POA: Insufficient documentation

## 2017-11-12 DIAGNOSIS — Z794 Long term (current) use of insulin: Secondary | ICD-10-CM | POA: Insufficient documentation

## 2017-11-12 DIAGNOSIS — K36 Other appendicitis: Principal | ICD-10-CM | POA: Insufficient documentation

## 2017-11-12 DIAGNOSIS — Z955 Presence of coronary angioplasty implant and graft: Secondary | ICD-10-CM | POA: Insufficient documentation

## 2017-11-12 DIAGNOSIS — Z880 Allergy status to penicillin: Secondary | ICD-10-CM | POA: Insufficient documentation

## 2017-11-12 DIAGNOSIS — Z720 Tobacco use: Secondary | ICD-10-CM | POA: Diagnosis present

## 2017-11-12 DIAGNOSIS — Z833 Family history of diabetes mellitus: Secondary | ICD-10-CM | POA: Insufficient documentation

## 2017-11-12 DIAGNOSIS — E118 Type 2 diabetes mellitus with unspecified complications: Secondary | ICD-10-CM | POA: Insufficient documentation

## 2017-11-12 DIAGNOSIS — Z7901 Long term (current) use of anticoagulants: Secondary | ICD-10-CM

## 2017-11-12 DIAGNOSIS — I252 Old myocardial infarction: Secondary | ICD-10-CM | POA: Insufficient documentation

## 2017-11-12 DIAGNOSIS — E1165 Type 2 diabetes mellitus with hyperglycemia: Secondary | ICD-10-CM | POA: Diagnosis present

## 2017-11-12 DIAGNOSIS — Z7982 Long term (current) use of aspirin: Secondary | ICD-10-CM | POA: Insufficient documentation

## 2017-11-12 DIAGNOSIS — Z87442 Personal history of urinary calculi: Secondary | ICD-10-CM | POA: Insufficient documentation

## 2017-11-12 DIAGNOSIS — Z9889 Other specified postprocedural states: Secondary | ICD-10-CM | POA: Insufficient documentation

## 2017-11-12 HISTORY — PX: LAPAROSCOPIC APPENDECTOMY: SHX408

## 2017-11-12 LAB — GLUCOSE, CAPILLARY
GLUCOSE-CAPILLARY: 168 mg/dL — AB (ref 65–99)
GLUCOSE-CAPILLARY: 165 mg/dL — AB (ref 65–99)
GLUCOSE-CAPILLARY: 275 mg/dL — AB (ref 65–99)
Glucose-Capillary: 224 mg/dL — ABNORMAL HIGH (ref 65–99)
Glucose-Capillary: 233 mg/dL — ABNORMAL HIGH (ref 65–99)

## 2017-11-12 SURGERY — APPENDECTOMY, LAPAROSCOPIC
Anesthesia: General | Site: Abdomen

## 2017-11-12 MED ORDER — HYDROCORTISONE 1 % EX CREA
1.0000 "application " | TOPICAL_CREAM | Freq: Three times a day (TID) | CUTANEOUS | Status: DC | PRN
  Filled 2017-11-12: qty 28

## 2017-11-12 MED ORDER — DIPHENHYDRAMINE HCL 50 MG/ML IJ SOLN: 12.5000 mg | Freq: Four times a day (QID) | INTRAMUSCULAR | Status: DC | PRN

## 2017-11-12 MED ORDER — DEXAMETHASONE SODIUM PHOSPHATE 4 MG/ML IJ SOLN
INTRAMUSCULAR | Status: DC | PRN
  Administered 2017-11-12: 4 mg via INTRAVENOUS

## 2017-11-12 MED ORDER — BUPIVACAINE-EPINEPHRINE 0.25% -1:200000 IJ SOLN
INTRAMUSCULAR | Status: DC | PRN
  Administered 2017-11-12: 50 mL

## 2017-11-12 MED ORDER — ALUM & MAG HYDROXIDE-SIMETH 200-200-20 MG/5ML PO SUSP: 30.0000 mL | Freq: Four times a day (QID) | ORAL | Status: DC | PRN

## 2017-11-12 MED ORDER — ROCURONIUM BROMIDE 10 MG/ML (PF) SYRINGE
PREFILLED_SYRINGE | INTRAVENOUS | Status: AC
  Filled 2017-11-12: qty 5

## 2017-11-12 MED ORDER — GABAPENTIN 300 MG PO CAPS
300.0000 mg | ORAL_CAPSULE | ORAL | Status: AC
  Administered 2017-11-12: 300 mg via ORAL
  Filled 2017-11-12: qty 1

## 2017-11-12 MED ORDER — LIDOCAINE 2% (20 MG/ML) 5 ML SYRINGE
INTRAMUSCULAR | Status: DC | PRN
  Administered 2017-11-12: 1.5 mg/kg/h via INTRAVENOUS

## 2017-11-12 MED ORDER — MENTHOL 3 MG MT LOZG: 1.0000 | LOZENGE | OROMUCOSAL | Status: DC | PRN

## 2017-11-12 MED ORDER — LACTATED RINGERS IR SOLN
Status: DC | PRN
  Administered 2017-11-12: 1000 mL

## 2017-11-12 MED ORDER — ACETAMINOPHEN 500 MG PO TABS
1000.0000 mg | ORAL_TABLET | ORAL | Status: AC
  Administered 2017-11-12: 1000 mg via ORAL
  Filled 2017-11-12: qty 2

## 2017-11-12 MED ORDER — SUGAMMADEX SODIUM 500 MG/5ML IV SOLN
INTRAVENOUS | Status: AC
  Filled 2017-11-12: qty 5

## 2017-11-12 MED ORDER — OXYCODONE HCL 5 MG PO TABS: 5.0000 mg | ORAL_TABLET | Freq: Once | ORAL | Status: DC | PRN

## 2017-11-12 MED ORDER — SODIUM CHLORIDE 0.9% FLUSH: 3.0000 mL | INTRAVENOUS | Status: DC | PRN

## 2017-11-12 MED ORDER — ENOXAPARIN SODIUM 40 MG/0.4ML ~~LOC~~ SOLN
40.0000 mg | SUBCUTANEOUS | Status: DC
  Filled 2017-11-12: qty 0.4

## 2017-11-12 MED ORDER — CHLORHEXIDINE GLUCONATE CLOTH 2 % EX PADS: 6.0000 | MEDICATED_PAD | Freq: Once | CUTANEOUS | Status: DC

## 2017-11-12 MED ORDER — PHENYLEPHRINE 40 MCG/ML (10ML) SYRINGE FOR IV PUSH (FOR BLOOD PRESSURE SUPPORT)
PREFILLED_SYRINGE | INTRAVENOUS | Status: DC | PRN
  Administered 2017-11-12 (×4): 80 ug via INTRAVENOUS

## 2017-11-12 MED ORDER — PROCHLORPERAZINE MALEATE 10 MG PO TABS: 10.0000 mg | ORAL_TABLET | Freq: Four times a day (QID) | ORAL | Status: DC | PRN

## 2017-11-12 MED ORDER — TRAMADOL HCL 50 MG PO TABS
50.0000 mg | ORAL_TABLET | Freq: Four times a day (QID) | ORAL | Status: DC | PRN
  Administered 2017-11-12: 100 mg via ORAL
  Filled 2017-11-12: qty 2

## 2017-11-12 MED ORDER — ONDANSETRON HCL 4 MG/2ML IJ SOLN
INTRAMUSCULAR | Status: AC
  Filled 2017-11-12: qty 2

## 2017-11-12 MED ORDER — HYDROCORTISONE 2.5 % RE CREA
1.0000 "application " | TOPICAL_CREAM | Freq: Four times a day (QID) | RECTAL | Status: DC | PRN
  Filled 2017-11-12: qty 28.35

## 2017-11-12 MED ORDER — MAGIC MOUTHWASH
15.0000 mL | Freq: Four times a day (QID) | ORAL | Status: DC | PRN
  Filled 2017-11-12: qty 15

## 2017-11-12 MED ORDER — SIMETHICONE 80 MG PO CHEW: 40.0000 mg | CHEWABLE_TABLET | Freq: Four times a day (QID) | ORAL | Status: DC | PRN

## 2017-11-12 MED ORDER — PHENOL 1.4 % MT LIQD
1.0000 | OROMUCOSAL | Status: DC | PRN
  Filled 2017-11-12: qty 177

## 2017-11-12 MED ORDER — METFORMIN HCL ER 500 MG PO TB24
1000.0000 mg | ORAL_TABLET | Freq: Two times a day (BID) | ORAL | Status: DC
  Administered 2017-11-12 – 2017-11-13 (×2): 1000 mg via ORAL
  Filled 2017-11-12 (×2): qty 2

## 2017-11-12 MED ORDER — HYDRALAZINE HCL 20 MG/ML IJ SOLN: 5.0000 mg | INTRAMUSCULAR | Status: DC | PRN

## 2017-11-12 MED ORDER — POLYETHYLENE GLYCOL 3350 17 G PO PACK: 17.0000 g | PACK | Freq: Every day | ORAL | Status: DC | PRN

## 2017-11-12 MED ORDER — ONDANSETRON 4 MG PO TBDP: 4.0000 mg | ORAL_TABLET | Freq: Four times a day (QID) | ORAL | Status: DC | PRN

## 2017-11-12 MED ORDER — DEXAMETHASONE SODIUM PHOSPHATE 10 MG/ML IJ SOLN
INTRAMUSCULAR | Status: AC
  Filled 2017-11-12: qty 1

## 2017-11-12 MED ORDER — OMEGA-3 1000 MG PO CAPS: 1000.0000 mg | ORAL_CAPSULE | Freq: Two times a day (BID) | ORAL | Status: DC

## 2017-11-12 MED ORDER — OXYCODONE HCL 5 MG/5ML PO SOLN
5.0000 mg | Freq: Once | ORAL | Status: DC | PRN
  Filled 2017-11-12: qty 5

## 2017-11-12 MED ORDER — PROPOFOL 10 MG/ML IV BOLUS
INTRAVENOUS | Status: AC
  Filled 2017-11-12: qty 40

## 2017-11-12 MED ORDER — BUPIVACAINE-EPINEPHRINE 0.25% -1:200000 IJ SOLN
INTRAMUSCULAR | Status: AC
  Filled 2017-11-12: qty 1

## 2017-11-12 MED ORDER — INSULIN ASPART 100 UNIT/ML ~~LOC~~ SOLN
0.0000 [IU] | Freq: Every day | SUBCUTANEOUS | Status: DC
  Administered 2017-11-12: 3 [IU] via SUBCUTANEOUS

## 2017-11-12 MED ORDER — SUCCINYLCHOLINE CHLORIDE 200 MG/10ML IV SOSY
PREFILLED_SYRINGE | INTRAVENOUS | Status: AC
  Filled 2017-11-12: qty 10

## 2017-11-12 MED ORDER — TRAMADOL HCL 50 MG PO TABS: 50.0000 mg | ORAL_TABLET | Freq: Four times a day (QID) | ORAL | 0 refills | Status: DC | PRN

## 2017-11-12 MED ORDER — ACETAMINOPHEN 500 MG PO TABS
1000.0000 mg | ORAL_TABLET | Freq: Three times a day (TID) | ORAL | Status: DC
  Administered 2017-11-12 – 2017-11-13 (×3): 1000 mg via ORAL
  Filled 2017-11-12 (×3): qty 2

## 2017-11-12 MED ORDER — SUCCINYLCHOLINE CHLORIDE 200 MG/10ML IV SOSY
PREFILLED_SYRINGE | INTRAVENOUS | Status: DC | PRN
  Administered 2017-11-12: 160 mg via INTRAVENOUS

## 2017-11-12 MED ORDER — SODIUM CHLORIDE 0.9% FLUSH
3.0000 mL | Freq: Two times a day (BID) | INTRAVENOUS | Status: DC
  Administered 2017-11-12: 3 mL via INTRAVENOUS

## 2017-11-12 MED ORDER — FENTANYL CITRATE (PF) 100 MCG/2ML IJ SOLN
INTRAMUSCULAR | Status: AC
  Filled 2017-11-12: qty 2

## 2017-11-12 MED ORDER — METOPROLOL TARTRATE 5 MG/5ML IV SOLN
INTRAVENOUS | Status: DC | PRN
  Administered 2017-11-12: 2 mg via INTRAVENOUS
  Administered 2017-11-12 (×3): 1 mg via INTRAVENOUS

## 2017-11-12 MED ORDER — GUAIFENESIN-DM 100-10 MG/5ML PO SYRP
10.0000 mL | ORAL_SOLUTION | ORAL | Status: DC | PRN
Start: 2017-11-12 — End: 2017-11-13

## 2017-11-12 MED ORDER — ATORVASTATIN CALCIUM 40 MG PO TABS
80.0000 mg | ORAL_TABLET | Freq: Every day | ORAL | Status: DC
  Administered 2017-11-12: 80 mg via ORAL
  Filled 2017-11-12: qty 2

## 2017-11-12 MED ORDER — GLUCERNA SHAKE PO LIQD
237.0000 mL | Freq: Two times a day (BID) | ORAL | Status: DC
  Administered 2017-11-12: 237 mL via ORAL
  Filled 2017-11-12 (×2): qty 237

## 2017-11-12 MED ORDER — OMEGA-3-ACID ETHYL ESTERS 1 G PO CAPS
1.0000 g | ORAL_CAPSULE | Freq: Two times a day (BID) | ORAL | Status: DC
  Administered 2017-11-12: 1 g via ORAL
  Filled 2017-11-12: qty 1

## 2017-11-12 MED ORDER — ROCURONIUM BROMIDE 10 MG/ML (PF) SYRINGE
PREFILLED_SYRINGE | INTRAVENOUS | Status: DC | PRN
  Administered 2017-11-12: 50 mg via INTRAVENOUS

## 2017-11-12 MED ORDER — FENTANYL CITRATE (PF) 100 MCG/2ML IJ SOLN: 25.0000 ug | INTRAMUSCULAR | Status: DC | PRN

## 2017-11-12 MED ORDER — ALPRAZOLAM 1 MG PO TABS: 1.0000 mg | ORAL_TABLET | Freq: Every day | ORAL | Status: DC | PRN

## 2017-11-12 MED ORDER — METHOCARBAMOL 500 MG PO TABS: 750.0000 mg | ORAL_TABLET | Freq: Four times a day (QID) | ORAL | Status: DC | PRN

## 2017-11-12 MED ORDER — HYDROMORPHONE HCL 1 MG/ML IJ SOLN: 0.5000 mg | INTRAMUSCULAR | Status: DC | PRN

## 2017-11-12 MED ORDER — DIPHENHYDRAMINE HCL 12.5 MG/5ML PO ELIX: 12.5000 mg | ORAL_SOLUTION | Freq: Four times a day (QID) | ORAL | Status: DC | PRN

## 2017-11-12 MED ORDER — BISACODYL 10 MG RE SUPP: 10.0000 mg | Freq: Two times a day (BID) | RECTAL | Status: DC | PRN

## 2017-11-12 MED ORDER — MEPERIDINE HCL 50 MG/ML IJ SOLN: 6.2500 mg | INTRAMUSCULAR | Status: DC | PRN

## 2017-11-12 MED ORDER — ONDANSETRON HCL 4 MG/2ML IJ SOLN
INTRAMUSCULAR | Status: DC | PRN
  Administered 2017-11-12: 4 mg via INTRAVENOUS

## 2017-11-12 MED ORDER — METOCLOPRAMIDE HCL 5 MG/ML IJ SOLN: 10.0000 mg | Freq: Four times a day (QID) | INTRAMUSCULAR | Status: DC | PRN

## 2017-11-12 MED ORDER — PROMETHAZINE HCL 25 MG/ML IJ SOLN: 6.2500 mg | INTRAMUSCULAR | Status: DC | PRN

## 2017-11-12 MED ORDER — SUGAMMADEX SODIUM 200 MG/2ML IV SOLN
INTRAVENOUS | Status: DC | PRN
  Administered 2017-11-12: 300 mg via INTRAVENOUS

## 2017-11-12 MED ORDER — LISINOPRIL 20 MG PO TABS
40.0000 mg | ORAL_TABLET | Freq: Every day | ORAL | Status: DC
  Administered 2017-11-12: 40 mg via ORAL
  Filled 2017-11-12: qty 2

## 2017-11-12 MED ORDER — METHOCARBAMOL 1000 MG/10ML IJ SOLN
1000.0000 mg | Freq: Four times a day (QID) | INTRAMUSCULAR | Status: DC | PRN
  Filled 2017-11-12: qty 10

## 2017-11-12 MED ORDER — MIDAZOLAM HCL 2 MG/2ML IJ SOLN
INTRAMUSCULAR | Status: AC
  Filled 2017-11-12: qty 2

## 2017-11-12 MED ORDER — MIDAZOLAM HCL 5 MG/5ML IJ SOLN
INTRAMUSCULAR | Status: DC | PRN
  Administered 2017-11-12: 2 mg via INTRAVENOUS

## 2017-11-12 MED ORDER — ONDANSETRON HCL 4 MG/2ML IJ SOLN: 4.0000 mg | Freq: Four times a day (QID) | INTRAMUSCULAR | Status: DC | PRN

## 2017-11-12 MED ORDER — NITROGLYCERIN 0.4 MG SL SUBL: 0.4000 mg | SUBLINGUAL_TABLET | SUBLINGUAL | Status: DC | PRN

## 2017-11-12 MED ORDER — LIDOCAINE 2% (20 MG/ML) 5 ML SYRINGE
INTRAMUSCULAR | Status: AC
  Filled 2017-11-12: qty 5

## 2017-11-12 MED ORDER — SACCHAROMYCES BOULARDII 250 MG PO CAPS
250.0000 mg | ORAL_CAPSULE | Freq: Two times a day (BID) | ORAL | Status: DC
  Administered 2017-11-12: 250 mg via ORAL
  Filled 2017-11-12: qty 1

## 2017-11-12 MED ORDER — INSULIN ASPART 100 UNIT/ML ~~LOC~~ SOLN
0.0000 [IU] | Freq: Three times a day (TID) | SUBCUTANEOUS | Status: DC
  Administered 2017-11-12: 5 [IU] via SUBCUTANEOUS
  Administered 2017-11-13: 2 [IU] via SUBCUTANEOUS

## 2017-11-12 MED ORDER — 0.9 % SODIUM CHLORIDE (POUR BTL) OPTIME
TOPICAL | Status: DC | PRN
  Administered 2017-11-12: 1000 mL

## 2017-11-12 MED ORDER — LIP MEDEX EX OINT
1.0000 "application " | TOPICAL_OINTMENT | Freq: Two times a day (BID) | CUTANEOUS | Status: DC
  Administered 2017-11-12: 1 via TOPICAL
  Filled 2017-11-12: qty 7

## 2017-11-12 MED ORDER — LIDOCAINE 2% (20 MG/ML) 5 ML SYRINGE
INTRAMUSCULAR | Status: DC | PRN
  Administered 2017-11-12: 80 mg via INTRAVENOUS

## 2017-11-12 MED ORDER — PROPOFOL 10 MG/ML IV BOLUS
INTRAVENOUS | Status: DC | PRN
  Administered 2017-11-12: 150 mg via INTRAVENOUS
  Administered 2017-11-12: 50 mg via INTRAVENOUS

## 2017-11-12 MED ORDER — ASPIRIN EC 81 MG PO TBEC
81.0000 mg | DELAYED_RELEASE_TABLET | Freq: Every day | ORAL | Status: DC
  Administered 2017-11-12: 81 mg via ORAL
  Filled 2017-11-12: qty 1

## 2017-11-12 MED ORDER — INSULIN DETEMIR 100 UNIT/ML ~~LOC~~ SOLN
60.0000 [IU] | Freq: Every day | SUBCUTANEOUS | Status: DC
  Administered 2017-11-12: 60 [IU] via SUBCUTANEOUS
  Filled 2017-11-12: qty 0.6

## 2017-11-12 MED ORDER — SODIUM CHLORIDE 0.9 % IV SOLN
INTRAVENOUS | Status: DC
  Administered 2017-11-12: 12:00:00 via INTRAVENOUS
  Administered 2017-11-13: 1000 mL via INTRAVENOUS

## 2017-11-12 MED ORDER — PSYLLIUM 95 % PO PACK
1.0000 | PACK | Freq: Two times a day (BID) | ORAL | Status: DC
  Administered 2017-11-12: 1 via ORAL
  Filled 2017-11-12: qty 1

## 2017-11-12 MED ORDER — METHOCARBAMOL 750 MG PO TABS: 750.0000 mg | ORAL_TABLET | Freq: Four times a day (QID) | ORAL | 2 refills | Status: DC | PRN

## 2017-11-12 MED ORDER — LACTATED RINGERS IV SOLN: 1000.0000 mL | Freq: Three times a day (TID) | INTRAVENOUS | Status: DC | PRN

## 2017-11-12 MED ORDER — GABAPENTIN 300 MG PO CAPS
300.0000 mg | ORAL_CAPSULE | Freq: Every day | ORAL | Status: DC
  Administered 2017-11-12: 300 mg via ORAL
  Filled 2017-11-12: qty 1

## 2017-11-12 MED ORDER — PROCHLORPERAZINE EDISYLATE 5 MG/ML IJ SOLN: 5.0000 mg | Freq: Four times a day (QID) | INTRAMUSCULAR | Status: DC | PRN

## 2017-11-12 MED ORDER — METOPROLOL TARTRATE 5 MG/5ML IV SOLN: 5.0000 mg | Freq: Four times a day (QID) | INTRAVENOUS | Status: DC | PRN

## 2017-11-12 MED ORDER — LACTATED RINGERS IV SOLN
INTRAVENOUS | Status: DC | PRN
  Administered 2017-11-12 (×2): via INTRAVENOUS

## 2017-11-12 MED ORDER — FENTANYL CITRATE (PF) 100 MCG/2ML IJ SOLN
INTRAMUSCULAR | Status: DC | PRN
  Administered 2017-11-12 (×4): 50 ug via INTRAVENOUS

## 2017-11-12 MED ORDER — CIPROFLOXACIN IN D5W 400 MG/200ML IV SOLN
400.0000 mg | Freq: Two times a day (BID) | INTRAVENOUS | Status: AC
  Administered 2017-11-12: 400 mg via INTRAVENOUS
  Filled 2017-11-12: qty 200

## 2017-11-12 MED ORDER — CARVEDILOL 3.125 MG PO TABS
3.1250 mg | ORAL_TABLET | Freq: Two times a day (BID) | ORAL | Status: DC
  Administered 2017-11-12: 3.125 mg via ORAL
  Filled 2017-11-12: qty 1

## 2017-11-12 MED ORDER — SODIUM CHLORIDE 0.9 % IV SOLN: 250.0000 mL | INTRAVENOUS | Status: DC | PRN

## 2017-11-12 MED ORDER — EZETIMIBE 10 MG PO TABS
10.0000 mg | ORAL_TABLET | Freq: Every day | ORAL | Status: DC
  Administered 2017-11-12: 10 mg via ORAL
  Filled 2017-11-12: qty 1

## 2017-11-12 MED ORDER — LACTATED RINGERS IV BOLUS (SEPSIS): 1000.0000 mL | Freq: Three times a day (TID) | INTRAVENOUS | Status: DC | PRN

## 2017-11-12 SURGICAL SUPPLY — 47 items
APPLIER CLIP 5 13 M/L LIGAMAX5 (MISCELLANEOUS)
APPLIER CLIP ROT 10 11.4 M/L (STAPLE)
APR CLP MED LRG 11.4X10 (STAPLE)
APR CLP MED LRG 5 ANG JAW (MISCELLANEOUS)
BAG SPEC RTRVL LRG 6X4 10 (ENDOMECHANICALS) ×1
CABLE HIGH FREQUENCY MONO STRZ (ELECTRODE) ×2 IMPLANT
CHLORAPREP W/TINT 26ML (MISCELLANEOUS) ×2 IMPLANT
CLIP APPLIE 5 13 M/L LIGAMAX5 (MISCELLANEOUS) IMPLANT
CLIP APPLIE ROT 10 11.4 M/L (STAPLE) IMPLANT
COVER SURGICAL LIGHT HANDLE (MISCELLANEOUS) ×2 IMPLANT
CUTTER FLEX LINEAR 45M (STAPLE) ×1 IMPLANT
DECANTER SPIKE VIAL GLASS SM (MISCELLANEOUS) ×2 IMPLANT
DEVICE TROCAR PUNCTURE CLOSURE (ENDOMECHANICALS) ×1 IMPLANT
DRAPE LAPAROSCOPIC ABDOMINAL (DRAPES) ×2 IMPLANT
DRAPE WARM FLUID 44X44 (DRAPE) ×2 IMPLANT
DRSG TEGADERM 2-3/8X2-3/4 SM (GAUZE/BANDAGES/DRESSINGS) ×3 IMPLANT
DRSG TEGADERM 4X4.75 (GAUZE/BANDAGES/DRESSINGS) ×2 IMPLANT
ELECT REM PT RETURN 15FT ADLT (MISCELLANEOUS) ×2 IMPLANT
ENDOLOOP SUT PDS II  0 18 (SUTURE) ×1
ENDOLOOP SUT PDS II 0 18 (SUTURE) IMPLANT
GAUZE SPONGE 2X2 8PLY STRL LF (GAUZE/BANDAGES/DRESSINGS) ×1 IMPLANT
GLOVE ECLIPSE 8.0 STRL XLNG CF (GLOVE) ×2 IMPLANT
GLOVE INDICATOR 8.0 STRL GRN (GLOVE) ×2 IMPLANT
GOWN STRL REUS W/TWL XL LVL3 (GOWN DISPOSABLE) ×4 IMPLANT
IRRIG SUCT STRYKERFLOW 2 WTIP (MISCELLANEOUS) ×2
IRRIGATION SUCT STRKRFLW 2 WTP (MISCELLANEOUS) ×1 IMPLANT
KIT BASIN OR (CUSTOM PROCEDURE TRAY) ×2 IMPLANT
PAD POSITIONING PINK XL (MISCELLANEOUS) ×2 IMPLANT
POUCH SPECIMEN RETRIEVAL 10MM (ENDOMECHANICALS) ×2 IMPLANT
RELOAD 45 VASCULAR/THIN (ENDOMECHANICALS) IMPLANT
RELOAD STAPLE 45 2.5 WHT GRN (ENDOMECHANICALS) IMPLANT
RELOAD STAPLE 45 3.5 BLU ETS (ENDOMECHANICALS) IMPLANT
RELOAD STAPLE TA45 3.5 REG BLU (ENDOMECHANICALS) ×2 IMPLANT
SCISSORS METZENBAUM CVD 33 (INSTRUMENTS) ×2 IMPLANT
SHEARS HARMONIC ACE PLUS 36CM (ENDOMECHANICALS) IMPLANT
SLEEVE XCEL OPT CAN 5 100 (ENDOMECHANICALS) ×2 IMPLANT
SPONGE GAUZE 2X2 STER 10/PKG (GAUZE/BANDAGES/DRESSINGS) ×1
SUT MNCRL AB 4-0 PS2 18 (SUTURE) ×2 IMPLANT
SUT PDS AB 0 CT1 36 (SUTURE) IMPLANT
SUT PDS AB 1 CT1 27 (SUTURE) ×2 IMPLANT
SUT SILK 2 0 SH (SUTURE) IMPLANT
TOWEL OR 17X26 10 PK STRL BLUE (TOWEL DISPOSABLE) ×2 IMPLANT
TRAY FOLEY W/METER SILVER 16FR (SET/KITS/TRAYS/PACK) ×1 IMPLANT
TRAY LAPAROSCOPIC (CUSTOM PROCEDURE TRAY) ×2 IMPLANT
TROCAR BLADELESS OPT 5 100 (ENDOMECHANICALS) ×2 IMPLANT
TROCAR XCEL 12X100 BLDLESS (ENDOMECHANICALS) ×2 IMPLANT
TUBING INSUF HEATED (TUBING) ×2 IMPLANT

## 2017-11-12 NOTE — Transfer of Care (Signed)
Immediate Anesthesia Transfer of Care Note  Patient: Lance [name]  Procedure(s) Performed: APPENDECTOMY LAPAROSCOPIC, REPAIR OF INCARCERATED INCISIONAL HERNIA (N/A Abdomen)  Patient Location: PACU  Anesthesia Type:General  Level of Consciousness: drowsy and patient cooperative  Airway & Oxygen Therapy: Patient Spontanous Breathing and aerosol face mask  Post-op Assessment: Report given to RN and Post -op Vital signs reviewed and stable  Post vital signs: Reviewed and stable  Last Vitals:  Vitals:   11/12/17 0555  BP: 134/75  Pulse: 68  Resp: 18  Temp: 36.6 C  SpO2: 97%    Last Pain:  Vitals:   11/12/17 0555  TempSrc: Oral      Patients Stated Pain Goal: 4 (11/12/17 0610)  Complications: No apparent anesthesia complications

## 2017-11-12 NOTE — Progress Notes (Signed)
Pt did not take carvedilol this morning, Dr. Renold DonGermeroth updated, no need per Dr. Renold DonGermeroth to give to pt before surgery. Will treat with metoprolol during surgery if needed. Ron, CRNA updated.

## 2017-11-12 NOTE — Anesthesia Procedure Notes (Signed)
Procedure Name: Intubation Date/Time: 11/12/2017 7:49 AM Performed by: Vanessa Durhamochran, Arthuro Canelo Glenn, CRNA Pre-anesthesia Checklist: Patient identified, Emergency Drugs available, Suction available and Patient being monitored Patient Re-evaluated:Patient Re-evaluated prior to induction Oxygen Delivery Method: Circle system utilized Preoxygenation: Pre-oxygenation with 100% oxygen Induction Type: IV induction Ventilation: Two handed mask ventilation required and Oral airway inserted - appropriate to patient size Laryngoscope Size: 2 and Miller Grade View: Grade II Tube type: Oral Tube size: 8.0 mm Number of attempts: 1 Airway Equipment and Method: Stylet Placement Confirmation: ETT inserted through vocal cords under direct vision,  positive ETCO2 and breath sounds checked- equal and bilateral Secured at: 23 cm Tube secured with: Tape Dental Injury: Teeth and Oropharynx as per pre-operative assessment

## 2017-11-12 NOTE — Interval H&P Note (Signed)
History and Physical Interval Note:  11/12/2017 7:27 AM  Lance HaffJustin C Doris  has presented today for surgery, with the diagnosis of chronic appendicitis  The various methods of treatment have been discussed with the patient and family. After consideration of risks, benefits and other options for treatment, the patient has consented to  Procedure(s): APPENDECTOMY LAPAROSCOPIC (N/A) as a surgical intervention .  The patient's history has been reviewed, patient examined, no change in status, stable for surgery.  I have reviewed the patient's chart and labs.  Questions were answered to the patient's satisfaction.     Ardeth SportsmanSteven C Esthela Brandner

## 2017-11-12 NOTE — Op Note (Signed)
PATIENT:  Lance [name]  [age] y.o. male  Patient Care Team: Practice, [facility] as PCP - General Jonelle SidleMcDowell, Samuel G, MD as PCP - Cardiology (Cardiology) Karie SodaGross, Chiniqua Kilcrease, MD as Consulting Physician (General Surgery)  PRE-OPERATIVE DIAGNOSIS:  Chronic appendicitis  POST-OPERATIVE DIAGNOSIS:    Chronic appendicitis Iincarcerated incisional hernia  PROCEDURE:    LAPAROSCOPIC APPENDECTOMY REPAIR OF INCARCERATED INCISIONAL HERNIA  SURGEON:  Ardeth SportsmanSteven C. Rashaad Hallstrom, MD  ANESTHESIA:   local and general  EBL:  Total I/O In: 1000 [I.V.:1000] Out: 110 [Urine:100; Blood:10]  Delay start of Pharmacological VTE agent (>24hrs) due to surgical blood loss or risk of bleeding:  no  DRAINS: none   SPECIMEN:   APPENDIX  DISPOSITION OF SPECIMEN:  PATHOLOGY  COUNTS:  YES  PLAN OF CARE: Admit for overnight observation  PATIENT DISPOSITION:  PACU - hemodynamically stable.   INDICATIONS: Patient with concerning symptoms & work up suspicious for appendicitis.  Required admission and IV antibiotics.  Significant cardiac issues.  Eventually stabilized.  Discussion made for interval appendectomy.  Still with some right sided abdominal pain.  Given his relatively young age and significant comorbidities, I recommended surgery:  The anatomy & physiology of the digestive tract was discussed.  The pathophysiology of appendicitis was discussed.  Natural history risks without surgery was discussed.   I feel the risks of no intervention will lead to serious problems that outweigh the operative risks; therefore, I recommended diagnostic laparoscopy with removal of appendix to remove the pathology.  Laparoscopic & open techniques were discussed.   I noted a good likelihood this will help address the problem.    Risks such as bleeding, infection, abscess, leak, reoperation, possible ostomy, hernia, heart attack, death, and other risks were discussed.  Goals of post-operative recovery were discussed as well.  We  will work to minimize complications.  Questions were answered.  The patient expresses understanding & wishes to proceed with surgery.  OR FINDINGS: Chronic adhesions in the ileocecal region with retrocecal high riding appendix.  No gangrene or perforation.  Supraumbilical incision incarcerated with omentum.  Stapler and specimen extraction through this.  Primarily repaired.  4 x 2 cm region.  No Meckel's diverticulum.  No ileitis.  No other abnormalities obvious.  DESCRIPTION:   The patient was identified & brought into the operating room. The patient was positioned supine with arms tucked. SCDs were active during the entire case. The patient underwent general anesthesia without any difficulty.  The abdomen was prepped and draped in a sterile fashion. A Surgical Timeout confirmed our plan.  I placed a 5 mm port through the left upper quadrant using optical entry technique through a prior port site. We induced carbon dioxide insufflation.  Camera inspection revealed no injury.  Patient had a moderate wad of omentum in the periumbilical region.  I carefully freed this off for better visualization.  Reduced greater omentum out of a Swiss cheese type incisional hernia in the region.  I placed the 12 mm stapler port through this.I placed additional ports under direct laparoscopic visualization.  I mobilized the terminal ileum to proximal ascending colon in a lateral to medial fashion.  I took care to avoid injuring any retroperitoneal structures.  I could identify a retrocecal appendix.  Still thickened and hard but no definite perforation or gangrene.  I freed the appendix off its attachments to the ascending colon and cecal mesentery.  I elevated the appendix. I skeletonized the mesoappendix. I was able to free off the base of the  appendix which was still viable.  I stapled the appendix off the cecum using a laparoscopic stapler. I took a healthy cuff of viable cecum. I ligated the mesoappendix and  assured hemostasis in the mesentery using a 0 PDS Endoloop.  I placed the appendix inside an EndoCatch bag and removed out the 12 mm port.  I did copious irrigation. Hemostasis was good in the mesoappendix, colon mesentery, and retroperitoneum. Staple line was intact on the cecum with no bleeding. I washed out the pelvis, retrohepatic space and right paracolic gutter. I washed out the left side as well.  Hemostasis is good.  I ran the small intestine proximally to disprove any ileitis or Meckel's diverticulum.  No other abnormality.  Thickened fatty shortened mesentery but no other obvious abnormalities.  There was no perforation or injury.  Because the area cleaned up well after irrigation, I did not place a drain.  I closed the supraumbilical hernia with interrupted #1 PDS suture using laparoscopic suture passer vertically under direct laparoscopic visualization.   I aspirated the carbon dioxide. I removed the ports.  I closed skin using 4-0 monocryl stitch.  Sterile dressings applied.  Patient was extubated and sent to the recovery room.  I suspect the patient is going need to stay in the hospital at least overnight given his significant cardiac and diabetic issues.  We will give 1 dose overnight but I do not think he needs prolonged antibiotics.  I discussed operative findings, updated the patient's status, discussed probable steps to recovery, and gave postoperative recommendations to the patient's spouse.  Recommendations were made.  Questions were answered.  She expressed understanding & appreciation.   [name], M.D., F.A.C.S. Gastrointestinal and Minimally Invasive Surgery [facility] [address], [address] [place], [place] [address] [phone] Main / Paging  11/12/2017 9:08 AM

## 2017-11-12 NOTE — Anesthesia Postprocedure Evaluation (Signed)
Anesthesia Post Note  Patient: Lance [name]  Procedure(s) Performed: APPENDECTOMY LAPAROSCOPIC, REPAIR OF INCARCERATED INCISIONAL HERNIA (N/A Abdomen)     Patient location during evaluation: PACU Anesthesia Type: General Level of consciousness: sedated and patient cooperative Pain management: pain level controlled Vital Signs Assessment: post-procedure vital signs reviewed and stable Respiratory status: spontaneous breathing Cardiovascular status: stable Anesthetic complications: no    Last Vitals:  Vitals:   11/12/17 1030 11/12/17 1045  BP: (!) 142/85 130/78  Pulse: (!) 59 (!) 58  Resp: 17 12  Temp:    SpO2: 100% 99%    Last Pain:  Vitals:   11/12/17 1045  TempSrc:   PainSc: 0-No pain                 Lewie LoronJohn Ingra Rother

## 2017-11-13 DIAGNOSIS — K36 Other appendicitis: Secondary | ICD-10-CM | POA: Diagnosis not present

## 2017-11-13 LAB — GLUCOSE, CAPILLARY: GLUCOSE-CAPILLARY: 122 mg/dL — AB (ref 65–99)

## 2017-11-13 NOTE — Discharge Instructions (Signed)
STOP SMOKING!  We strongly recommend that you stop smoking.  Smoking increases the risk of surgery including infection in the form of an open wound, pus formation, abscess, hernia at an incision on the abdomen, etc.  You have an increased risk of other MAJOR complications such as stroke, heart attack, forming clots in the leg and/or lungs, and death.    Smoking Cessation Quitting smoking is important to your health and has many advantages. However, it is not always easy to quit since nicotine is a very addictive drug. Often times, people try 3 times or more before being able to quit. This document explains the best ways for you to prepare to quit smoking. Quitting takes hard work and a lot of effort, but you can do it. ADVANTAGES OF QUITTING SMOKING  You will live longer, feel better, and live better.  Your body will feel the impact of quitting smoking almost immediately.  Within 20 minutes, blood pressure decreases. Your pulse returns to its normal level.  After 8 hours, carbon monoxide levels in the blood return to normal. Your oxygen level increases.  After 24 hours, the chance of having a heart attack starts to decrease. Your breath, hair, and body stop smelling like smoke.  After 48 hours, damaged nerve endings begin to recover. Your sense of taste and smell improve.  After 72 hours, the body is virtually free of nicotine. Your bronchial tubes relax and breathing becomes easier.  After 2 to 12 weeks, lungs can hold more air. Exercise becomes easier and circulation improves.  The risk of having a heart attack, stroke, cancer, or lung disease is greatly reduced.  After 1 year, the risk of coronary heart disease is cut in half.  After 5 years, the risk of stroke falls to the same as a nonsmoker.  After 10 years, the risk of lung cancer is cut in half and the risk of other cancers decreases significantly.  After 15 years, the risk of coronary heart disease drops, usually to the level  of a nonsmoker.  If you are pregnant, quitting smoking will improve your chances of having a healthy baby.  The people you live with, especially any children, will be healthier.  You will have extra money to spend on things other than cigarettes. QUESTIONS TO THINK ABOUT BEFORE ATTEMPTING TO QUIT You may want to talk about your answers with your caregiver.  Why do you want to quit?  If you tried to quit in the past, what helped and what did not?  What will be the most difficult situations for you after you quit? How will you plan to handle them?  Who can help you through the tough times? Your family? Friends? A caregiver?  What pleasures do you get from smoking? What ways can you still get pleasure if you quit? Here are some questions to ask your caregiver:  How can you help me to be successful at quitting?  What medicine do you think would be best for me and how should I take it?  What should I do if I need more help?  What is smoking withdrawal like? How can I get information on withdrawal? GET READY  Set a quit date.  Change your environment by getting rid of all cigarettes, ashtrays, matches, and lighters in your home, car, or work. Do not let people smoke in your home.  Review your past attempts to quit. Think about what worked and what did not. GET SUPPORT AND ENCOURAGEMENT You have a  better chance of being successful if you have help. You can get support in many ways.  Tell your family, friends, and co-workers that you are going to quit and need their support. Ask them not to smoke around you.  Get individual, group, or telephone counseling and support. Programs are available at [employer] and health centers. Call your local health department for information about programs in your area.  Spiritual beliefs and practices may help some smokers quit.  Download a "quit meter" on your computer to keep track of quit statistics, such as how long you have gone without  smoking, cigarettes not smoked, and money saved.  Get a self-help book about quitting smoking and staying off of tobacco. [employer] yourself from urges to smoke. Talk to someone, go for a walk, or occupy your time with a task.  Change your normal routine. Take a different route to work. Drink tea instead of coffee. Eat breakfast in a different place.  Reduce your stress. Take a hot bath, exercise, or read a book.  Plan something enjoyable to do every day. Reward yourself for not smoking.  Explore interactive web-based programs that specialize in helping you quit. GET MEDICINE AND USE IT CORRECTLY Medicines can help you stop smoking and decrease the urge to smoke. Combining medicine with the above behavioral methods and support can greatly increase your chances of successfully quitting smoking.  Nicotine replacement therapy helps deliver nicotine to your body without the negative effects and risks of smoking. Nicotine replacement therapy includes nicotine gum, lozenges, inhalers, nasal sprays, and skin patches. Some may be available over-the-counter and others require a prescription.  Antidepressant medicine helps people abstain from smoking, but how this works is unknown. This medicine is available by prescription.  Nicotinic receptor partial agonist medicine simulates the effect of nicotine in your brain. This medicine is available by prescription. Ask your caregiver for advice about which medicines to use and how to use them based on your health history. Your caregiver will tell you what side effects to look out for if you choose to be on a medicine or therapy. Carefully read the information on the package. Do not use any other product containing nicotine while using a nicotine replacement product.  RELAPSE OR DIFFICULT SITUATIONS Most relapses occur within the first 3 months after quitting. Do not be discouraged if you start smoking again. Remember, most  people try several times before finally quitting. You may have symptoms of withdrawal because your body is used to nicotine. You may crave cigarettes, be irritable, feel very hungry, cough often, get headaches, or have difficulty concentrating. The withdrawal symptoms are only temporary. They are strongest when you first quit, but they will go away within 10 14 days. To reduce the chances of relapse, try to:  Avoid drinking alcohol. Drinking lowers your chances of successfully quitting.  Reduce the amount of caffeine you consume. Once you quit smoking, the amount of caffeine in your body increases and can give you symptoms, such as a rapid heartbeat, sweating, and anxiety.  Avoid smokers because they can make you want to smoke.  Do not let weight gain distract you. Many smokers will gain weight when they quit, usually less than 10 pounds. Eat a healthy diet and stay active. You can always lose the weight gained after you quit.  Find ways to improve your mood other than smoking. FOR MORE INFORMATION  www.smokefree.gov    While it can be one of the  most difficult things to do, the [place] community has programs to help you stop.  Consider talking with your primary care physician about options.  Also, Smoking Cessation classes are available through the [facility]:  The smoking cessation program is a proven-effective program from the American Lung Association. The program is available for anyone 67 and older who currently smokes. The program lasts for 7 weeks and is 8 sessions. Each class will be approximately 1 1/2 hours. The program is every Tuesday.  All classes are 12-1:30pm and same location.  Event Location Information:  Location: [facility] 2nd Floor Conference Room 2-037; located next to [facility] cross streets: [facility] Entrance into the [facility] is adjacent to the [facility] main entrance.  The conference room is located on the 2nd floor.  Parking Instructions: Visitor parking is adjacent to [employer] main entrance and the Cancer Center    A smoking cessation program is also offered through the [facility]. Register online at [email or web address] or call [phone] for more information.   Tobacco cessation counseling is available at [facility]. Call [phone] for a free appointment.   Tobacco cessation classes also are available through the [facility] in [place]. For information, call [phone].   The Patient Education Network features videos on tobacco cessation. Please consult your listings in the center of this book to find instructions on how to access this resource.   If you want more information, ask your nurse.        LAPAROSCOPIC SURGERY: POST OP INSTRUCTIONS  ######################################################################  EAT Gradually transition to a high fiber diet with a fiber supplement over the next few weeks after discharge.  Start with a pureed / full liquid diet (see below)  WALK Walk an hour a day.  Control your pain to do that.    CONTROL PAIN Control pain so that you can walk, sleep, tolerate sneezing/coughing, go up/down stairs.  HAVE A BOWEL MOVEMENT DAILY Keep your bowels regular to avoid problems.  OK to try a laxative to override constipation.  OK to use an antidairrheal to slow down diarrhea.  Call if not better after 2 tries  CALL IF YOU HAVE PROBLEMS/CONCERNS Call if you are still struggling despite following these instructions. Call if you have concerns not answered by these instructions  ######################################################################    1. DIET: Follow a light bland diet the first 24 hours after arrival home, such as soup, liquids, crackers, etc.  Be sure to include lots of fluids daily.  Avoid fast food or heavy meals as your are more  likely to get nauseated.  Eat a low fat the next few days after surgery.   2. Take your usually prescribed home medications unless otherwise directed. 3. PAIN CONTROL: a. Pain is best controlled by a usual combination of three different methods TOGETHER: i. Ice/Heat ii. Over the counter pain medication iii. Prescription pain medication b. Most patients will experience some swelling and bruising around the incisions.  Ice packs or heating pads (30-60 minutes up to 6 times a day) will help. Use ice for the first few days to help decrease swelling and bruising, then switch to heat to help relax tight/sore spots and speed recovery.  Some people prefer to use ice alone, heat alone, alternating between ice & heat.  Experiment to what works for you.  Swelling and bruising can take several weeks to  resolve.   c. It is helpful to take an over-the-counter pain medication regularly for the first few weeks.  Choose one of the following that works best for you: i. Naproxen (Aleve, etc)  Two 220mg  tabs twice a day ii. Ibuprofen (Advil, etc) Three 200mg  tabs four times a day (every meal & bedtime) iii. Acetaminophen (Tylenol, etc) 500-650mg  four times a day (every meal & bedtime) d. A  prescription for pain medication (such as oxycodone, hydrocodone, etc) should be given to you upon discharge.  Take your pain medication as prescribed.  i. If you are having problems/concerns with the prescription medicine (does not control pain, nausea, vomiting, rash, itching, etc), please call us [phone] to see if we need to switch you to a different pain medicine that will work better for you and/or control your side effect better. ii. If you need a refill on your pain medication, please contact your pharmacy.  They will contact our office to request authorization. Prescriptions will not be filled after 5 pm or on week-ends. 4. Avoid getting constipated.  Between the surgery and the pain medications, it is common to  experience some constipation.  Increasing fluid intake and taking a fiber supplement (such as Metamucil, Citrucel, FiberCon, MiraLax, etc) 1-2 times a day regularly will usually help prevent this problem from occurring.  A mild laxative (prune juice, Milk of Magnesia, MiraLax, etc) should be taken according to package directions if there are no bowel movements after 48 hours.   5. Watch out for diarrhea.  If you have many loose bowel movements, simplify your diet to bland foods & liquids for a few days.  Stop any stool softeners and decrease your fiber supplement.  Switching to mild anti-diarrheal medications (Kayopectate, Pepto Bismol) can help.  If this worsens or does not improve, please call us. 6. Wash / shower every day.  You may shower over the dressings as they are waterproof.  Continue to shower over incision(s) after the dressing is off. 7. Remove your waterproof bandages 5 days after surgery.  You may leave the incision open to air.  You may replace a dressing/Band-Aid to cover the incision for comfort if you wish.  8. ACTIVITIES as tolerated:   a. You may resume regular (light) daily activities beginning the next day--such as daily self-care, walking, climbing stairs--gradually increasing activities as tolerated.  If you can walk 30 minutes without difficulty, it is safe to try more intense activity such as jogging, treadmill, bicycling, low-impact aerobics, swimming, etc. b. Save the most intensive and strenuous activity for last such as sit-ups, heavy lifting, contact sports, etc  Refrain from any heavy lifting or straining until you are off narcotics for pain control.   c. DO NOT PUSH THROUGH PAIN.  Let pain be your guide: If it hurts to do something, don't do it.  Pain is your body warning you to avoid that activity for another week until the pain goes down. d. You may drive when you are no longer taking prescription pain medication, you can comfortably wear a seatbelt, and you can safely  maneuver your car and apply brakes. e. Bonita Quin may have sexual intercourse when it is comfortable.  9. FOLLOW UP in our office a. Please call [facility] at [phone] to set up an appointment to see your surgeon in the office for a follow-up appointment approximately 2-3 weeks after your surgery. b. Make sure that you call for this appointment the day you arrive home to insure a convenient  appointment time. 10. IF YOU HAVE DISABILITY OR FAMILY LEAVE FORMS, BRING THEM TO THE OFFICE FOR PROCESSING.  DO NOT GIVE THEM TO YOUR DOCTOR.   WHEN TO CALL US [phone]: 1. Poor pain control 2. Reactions / problems with new medications (rash/itching, nausea, etc)  3. Fever over 101.5 F (38.5 C) 4. Inability to urinate 5. Nausea and/or vomiting 6. Worsening swelling or bruising 7. Continued bleeding from incision. 8. Increased pain, redness, or drainage from the incision   The clinic staff is available to answer your questions during regular business hours (8:30am-5pm).  Please dont hesitate to call and ask to speak to one of our nurses for clinical concerns.   If you have a medical emergency, go to the nearest emergency room or call 911.  [facility] is always [facility] call at the [facility]   [facility] [place] [address], [address], [place], [place]  [id number] ? MAIN: [phone] ? TOLL FREE: [phone] ?  FAX [phone] [email or web address]   Cholecystitis Cholecystitis is inflammation of the gallbladder. It is often called a gallbladder attack. The gallbladder is a pear-shaped organ that lies beneath the liver on the right side of the body. The gallbladder stores bile, which is a fluid that helps the body to digest fats. If bile builds up in your gallbladder, your gallbladder becomes inflamed. This condition may occur suddenly (be acute). Repeat episodes of acute cholecystitis or prolonged episodes may lead to a long-term (chronic)  condition. Cholecystitis is serious and it requires treatment. What are the causes? The most common cause of this condition is gallstones. Gallstones can block the tube (duct) that carries bile out of your gallbladder. This causes bile to build up. Other causes of this condition include:  Damage to the gallbladder due to a decrease in blood flow.  Infections in the bile ducts.  Scars or kinks in the bile ducts.  Tumors in the liver, pancreas, or gallbladder.  What increases the risk? This condition is more likely to develop in:  People who have sickle cell disease.  People who take birth control pills or use estrogen.  People who have alcoholic liver disease.  People who have liver cirrhosis.  People who have their nutrition delivered through a vein (parenteral nutrition).  People who do not eat or drink (do fasting) for a long period of time.  People who are obese.  People who have rapid weight loss.  People who are pregnant.  People who have increased triglyceride levels.  People who have pancreatitis.  What are the signs or symptoms? Symptoms of this condition include:  Abdominal pain, especially in the upper right area of the abdomen.  Abdominal tenderness or bloating.  Nausea.  Vomiting.  Fever.  Chills.  Yellowing of the skin and the whites of the eyes (jaundice).  How is this diagnosed? This condition is diagnosed with a medical history and physical exam. You may also have other tests, including:  Imaging tests, such as: ? An ultrasound of the gallbladder. ? A CT scan of the abdomen. ? A gallbladder nuclear scan (HIDA scan). This scan allows your health care provider to see the bile moving from your liver to your gallbladder and to your small intestine. ? MRI.  Blood tests, such as: ? A complete blood count, because the white blood cell count may be higher than normal. ? Liver function tests, because some levels may be higher than normal with  certain types of  gallstones.  How is this treated? Treatment may include:  Fasting for a certain amount of time.  IV fluids.  Medicine to treat pain or vomiting.  Antibiotic medicine.  Surgery to remove your gallbladder (cholecystectomy). This may happen immediately or at a later time.  Follow these instructions at home: Home care will depend on your treatment. In general:  Take over-the-counter and prescription medicines only as told by your health care provider.  If you were prescribed an antibiotic medicine, take it as told by your health care provider. Do not stop taking the antibiotic even if you start to feel better.  Follow instructions from your health care provider about what to eat or drink. When you are allowed to eat, avoid eating or drinking anything that triggers your symptoms.  Keep all follow-up visits as told by your health care provider. This is important.  Contact a health care provider if:  Your pain is not controlled with medicine.  You have a fever. Get help right away if:  Your pain moves to another part of your abdomen or to your back.  You continue to have symptoms or you develop new symptoms even with treatment. This information is not intended to replace advice given to you by your health care provider. Make sure you discuss any questions you have with your health care provider. Document Released: 09/22/2005 Document Revised: 01/31/2016 Document Reviewed: 01/03/2015 Elsevier Interactive Patient Education  2018 [employer].

## 2017-11-13 NOTE — Discharge Summary (Signed)
Physician Discharge Summary  Patient ID: Lance [name] MRN: [id number] DOB/AGE: March 19, 1976  42 y.o.  Admit date: 11/12/2017 Discharge date: 11/13/2017   Patient Care Team: Practice, [facility] as PCP - General Jonelle Sidle, MD as PCP - Cardiology (Cardiology) Karie Soda, MD as Consulting Physician (General Surgery)  Discharge Diagnoses:  Principal Problem:   Chronic appendicitis s/p lap appendectomy 11/12/2017 Active Problems:   CAD (coronary artery disease)   Type 2 diabetes mellitus, uncontrolled (HCC)   Chronic anticoagulation (Plavix)   Essential hypertension, benign   Mixed hyperlipidemia   Current smoker   Severe obesity with body mass index (BMI) of 35.0 to 39.9 with comorbidity (HCC)   S/P drug eluting coronary stent placement   Incarcerated incisional hernia s/p redeuction/primary repair 11/12/2017   1 Day Post-Op  11/12/2017  POST-OPERATIVE DIAGNOSIS:   Chronic appendicitis, incarcerated incisional hernia  SURGERY:  11/12/2017  APPENDECTOMY LAPAROSCOPIC REPAIR OF INCARCERATED INCISIONAL HERNIA  SURGEON:    Surgeon(s): Karie Soda, MD  Consults: None  Hospital Course:   The patient underwent the surgery above.  Postoperatively, the patient gradually mobilized and advanced to a solid diet.  Pain and other symptoms were treated aggressively.    By the time of discharge, the patient was walking well the hallways, eating food, having flatus.  Pain was well-controlled on an oral medications.  Based on meeting discharge criteria and continuing to recover, I felt it was safe for the patient to be discharged from the hospital to further recover with close followup. Postoperative recommendations were discussed in detail.  They are written as well.  Discharged Condition: good  Disposition:  Follow-up Information    Karie Soda, MD. Schedule an appointment as soon as possible for a visit in 3 week(s).   Specialty:  General Surgery Why:  To follow up  after your operation, To follow up after your hospital stay Contact information: [address] [place] [address] [phone]           01-Home or Self Care  Discharge Instructions    Call MD for:   Complete by:  As directed    FEVER > 101.5 F  (temperatures < 101.5 F are not significant)   Call MD for:   Complete by:  As directed    FEVER > 101.5 F  (temperatures < 101.5 F are not significant)   Call MD for:  extreme fatigue   Complete by:  As directed    Call MD for:  extreme fatigue   Complete by:  As directed    Call MD for:  persistant dizziness or light-headedness   Complete by:  As directed    Call MD for:  persistant dizziness or light-headedness   Complete by:  As directed    Call MD for:  persistant nausea and vomiting   Complete by:  As directed    Call MD for:  persistant nausea and vomiting   Complete by:  As directed    Call MD for:  redness, tenderness, or signs of infection (pain, swelling, redness, odor or green/yellow discharge around incision site)   Complete by:  As directed    Call MD for:  redness, tenderness, or signs of infection (pain, swelling, redness, odor or green/yellow discharge around incision site)   Complete by:  As directed    Call MD for:  severe uncontrolled pain   Complete by:  As directed    Call MD for:  severe uncontrolled pain  Complete by:  As directed    Diet - low sodium heart healthy   Complete by:  As directed    Follow a light diet the first few days at home.   Start with a bland diet such as soups, liquids, starchy foods, low fat foods, etc.   If you feel full, bloated, or constipated, stay on a full liquid or pureed/blenderized diet for a few days until you feel better and no longer constipated. Be sure to drink plenty of fluids every day to avoid getting dehydrated (feeling dizzy, not urinating, etc.). Gradually add a fiber supplement to your diet   Diet - low sodium heart healthy   Complete by:  As  directed    Follow a light diet the first few days at home.   Start with a bland diet such as soups, liquids, starchy foods, low fat foods, etc.   If you feel full, bloated, or constipated, stay on a full liquid or pureed/blenderized diet for a few days until you feel better and no longer constipated. Be sure to drink plenty of fluids every day to avoid getting dehydrated (feeling dizzy, not urinating, etc.). Gradually add a fiber supplement to your diet   Discharge instructions   Complete by:  As directed    See Discharge Instructions If you are not getting better after two weeks or are noticing you are getting worse, contact our office [phone] for further advice.  We may need to adjust your medications, re-evaluate you in the office, send you to the emergency room, or see what other things we can do to help. The clinic staff is available to answer your questions during regular business hours (8:30am-5pm).  Please don't hesitate to call and ask to speak to one of our nurses for clinical concerns.    A surgeon from [facility] is always on call at the hospitals 24 hours/day If you have a medical emergency, go to the nearest emergency room or call 911.   Discharge instructions   Complete by:  As directed    See Discharge Instructions If you are not getting better after two weeks or are noticing you are getting worse, contact our office [phone] for further advice.  We may need to adjust your medications, re-evaluate you in the office, send you to the emergency room, or see what other things we can do to help. The clinic staff is available to answer your questions during regular business hours (8:30am-5pm).  Please don't hesitate to call and ask to speak to one of our nurses for clinical concerns.    A surgeon from [facility] is always on call at the hospitals 24 hours/day If you have a medical emergency, go to the nearest emergency room or call 911.    Driving Restrictions   Complete by:  As directed    You may drive when you are no longer taking narcotic prescription pain medication, you can comfortably wear a seatbelt, and you can safely make sudden turns/stops to protect yourself without hesitating due to pain.   Driving Restrictions   Complete by:  As directed    You may drive when you are no longer taking narcotic prescription pain medication, you can comfortably wear a seatbelt, and you can safely make sudden turns/stops to protect yourself without hesitating due to pain.   Increase activity slowly   Complete by:  As directed    Start light daily activities --- self-care, walking, climbing stairs- beginning the  day after surgery.  Gradually increase activities as tolerated.  Control your pain to be active.  Stop when you are tired.  Ideally, walk several times a day, eventually an hour a day.   Most people are back to most day-to-day activities in a few weeks.  It takes 4-8 weeks to get back to unrestricted, intense activity. If you can walk 30 minutes without difficulty, it is safe to try more intense activity such as jogging, treadmill, bicycling, low-impact aerobics, swimming, etc. Save the most intensive and strenuous activity for last (Usually 4-8 weeks after surgery) such as sit-ups, heavy lifting, contact sports, etc.  Refrain from any intense heavy lifting or straining until you are off narcotics for pain control.  You will have off days, but things should improve week-by-week. DO NOT PUSH THROUGH PAIN.  Let pain be your guide: If it hurts to do something, don't do it.  Pain is your body warning you to avoid that activity for another week until the pain goes down.   Increase activity slowly   Complete by:  As directed    Start light daily activities --- self-care, walking, climbing stairs- beginning the day after surgery.  Gradually increase activities as tolerated.  Control your pain to be active.  Stop when you are tired.  Ideally,  walk several times a day, eventually an hour a day.   Most people are back to most day-to-day activities in a few weeks.  It takes 4-8 weeks to get back to unrestricted, intense activity. If you can walk 30 minutes without difficulty, it is safe to try more intense activity such as jogging, treadmill, bicycling, low-impact aerobics, swimming, etc. Save the most intensive and strenuous activity for last (Usually 4-8 weeks after surgery) such as sit-ups, heavy lifting, contact sports, etc.  Refrain from any intense heavy lifting or straining until you are off narcotics for pain control.  You will have off days, but things should improve week-by-week. DO NOT PUSH THROUGH PAIN.  Let pain be your guide: If it hurts to do something, don't do it.  Pain is your body warning you to avoid that activity for another week until the pain goes down.   Lifting restrictions   Complete by:  As directed    If you can walk 30 minutes without difficulty, it is safe to try more intense activity such as jogging, treadmill, bicycling, low-impact aerobics, swimming, etc. Save the most intensive and strenuous activity for last (Usually 4-8 weeks after surgery) such as sit-ups, heavy lifting, contact sports, etc.  Refrain from any intense heavy lifting or straining until you are off narcotics for pain control.  You will have off days, but things should improve week-by-week. DO NOT PUSH THROUGH PAIN.  Let pain be your guide: If it hurts to do something, don't do it.  Pain is your body warning you to avoid that activity for another week until the pain goes down.   Lifting restrictions   Complete by:  As directed    If you can walk 30 minutes without difficulty, it is safe to try more intense activity such as jogging, treadmill, bicycling, low-impact aerobics, swimming, etc. Save the most intensive and strenuous activity for last (Usually 4-8 weeks after surgery) such as sit-ups, heavy lifting, contact sports, etc.  Refrain from any  intense heavy lifting or straining until you are off narcotics for pain control.  You will have off days, but things should improve week-by-week. DO NOT PUSH THROUGH PAIN.  Let  pain be your guide: If it hurts to do something, don't do it.  Pain is your body warning you to avoid that activity for another week until the pain goes down.   May walk up steps   Complete by:  As directed    May walk up steps   Complete by:  As directed    No wound care   Complete by:  As directed    It is good for closed incision and even open wounds to be washed every day.  Shower every day.  Short baths are fine.  Wash the incisions and wounds clean with soap & water.    If you have a closed incision(s), wash the incision with soap & water every day.  You may leave closed incisions open to air if it is dry.   You may cover the incision with clean gauze & replace it after your daily shower for comfort. If you have skin tapes (Steristrips) or skin glue (Dermabond) on your incision, leave them in place.  They will fall off on their own like a scab.  You may trim any edges that curl up with clean scissors.  If you have staples, set up an appointment for them to be removed in the office in 10 days after surgery.  If you have a drain, wash around the skin exit site with soap & water and place a new dressing of gauze or band aid around the skin every day.  Keep the drain site clean & dry.   No wound care   Complete by:  As directed    It is good for closed incision and even open wounds to be washed every day.  Shower every day.  Short baths are fine.  Wash the incisions and wounds clean with soap & water.    If you have a closed incision(s), wash the incision with soap & water every day.  You may leave closed incisions open to air if it is dry.   You may cover the incision with clean gauze & replace it after your daily shower for comfort. If you have skin tapes (Steristrips) or skin glue (Dermabond) on your incision, leave them  in place.  They will fall off on their own like a scab.  You may trim any edges that curl up with clean scissors.  If you have staples, set up an appointment for them to be removed in the office in 10 days after surgery.  If you have a drain, wash around the skin exit site with soap & water and place a new dressing of gauze or band aid around the skin every day.  Keep the drain site clean & dry.   Sexual Activity Restrictions   Complete by:  As directed    You may have sexual intercourse when it is comfortable. If it hurts to do something, stop.   Sexual Activity Restrictions   Complete by:  As directed    You may have sexual intercourse when it is comfortable. If it hurts to do something, stop.      Allergies as of 11/13/2017      Reactions   Bee Venom Anaphylaxis, Swelling   Penicillins Hives, Itching, Rash   Has patient had a PCN reaction causing immediate rash, facial/tongue/throat swelling, SOB or lightheadedness with hypotension: No Has patient had a PCN reaction causing severe rash involving mucus membranes or skin necrosis: No Has patient had a PCN reaction that required hospitalization: No Has patient had  a PCN reaction occurring within the last 10 years: No If all of the above answers are "NO", then may proceed with Cephalosporin use.      Medication List    TAKE these medications   ALPRAZolam 1 MG tablet Commonly known as:  XANAX Take 1 mg by mouth daily as needed for anxiety.   aspirin EC 81 MG tablet Take 81 mg by mouth daily.   atorvastatin 80 MG tablet Commonly known as:  LIPITOR Take 1 tablet (80 mg total) by mouth daily. What changed:  when to take this   carvedilol 3.125 MG tablet Commonly known as:  COREG Take 1 tablet (3.125 mg total) by mouth 2 (two) times daily.   clopidogrel 75 MG tablet Commonly known as:  PLAVIX Take 1 tablet (75 mg total) by mouth daily with breakfast.   ezetimibe 10 MG tablet Commonly known as:  ZETIA Take 1 tablet (10 mg total)  by mouth daily.   Insulin Detemir 100 UNIT/ML Pen Commonly known as:  LEVEMIR FLEXPEN Inject 60 Units into the skin daily at 10 pm.   lisinopril 40 MG tablet Commonly known as:  PRINIVIL,ZESTRIL Take 40 mg by mouth daily.   metFORMIN 500 MG 24 hr tablet Commonly known as:  GLUCOPHAGE-XR Take 1,000 mg by mouth 2 (two) times daily.   methocarbamol 750 MG tablet Commonly known as:  ROBAXIN Take 1 tablet (750 mg total) by mouth 4 (four) times daily as needed (use for muscle cramps/pain).   nitroGLYCERIN 0.4 MG SL tablet Commonly known as:  NITROSTAT Place 1 tablet (0.4 mg total) under the tongue every 5 (five) minutes x 3 doses as needed for chest pain.   Omega-3 1000 MG Caps Take 1,000 mg by mouth 2 (two) times daily.   traMADol 50 MG tablet Commonly known as:  ULTRAM Take 1-2 tablets (50-100 mg total) by mouth every 6 (six) hours as needed for moderate pain or severe pain.       Significant Diagnostic Studies:  Results for orders placed or performed during the hospital encounter of 11/12/17 (from the past 72 hour(s))  Glucose, capillary     Status: Abnormal   Collection Time: 11/12/17  6:02 AM  Result Value Ref Range   Glucose-Capillary 224 (H) 65 - 99 mg/dL  Glucose, capillary     Status: Abnormal   Collection Time: 11/12/17  9:23 AM  Result Value Ref Range   Glucose-Capillary 168 (H) 65 - 99 mg/dL  Glucose, capillary     Status: Abnormal   Collection Time: 11/12/17 12:03 PM  Result Value Ref Range   Glucose-Capillary 165 (H) 65 - 99 mg/dL  Glucose, capillary     Status: Abnormal   Collection Time: 11/12/17  3:43 PM  Result Value Ref Range   Glucose-Capillary 233 (H) 65 - 99 mg/dL  Glucose, capillary     Status: Abnormal   Collection Time: 11/12/17  9:18 PM  Result Value Ref Range   Glucose-Capillary 275 (H) 65 - 99 mg/dL  Glucose, capillary     Status: Abnormal   Collection Time: 11/13/17  7:16 AM  Result Value Ref Range   Glucose-Capillary 122 (H) 65 - 99  mg/dL    No results found.  Discharge Exam: Blood pressure 134/75, pulse 74, temperature 97.6 F (36.4 C), temperature source Oral, resp. rate 18, height 5\' 11"  (1.803 m), weight 118.8 kg (262 lb), SpO2 98 %.  General: Pt awake/alert/oriented x4 in No acute distress Eyes: PERRL, normal EOM.  Sclera  clear.  No icterus Neuro: CN II-XII intact w/o focal sensory/motor deficits. Lymph: No head/neck/groin lymphadenopathy Psych:  No delerium/psychosis/paranoia HENT: Normocephalic, Mucus membranes moist.  No thrush Neck: Supple, No tracheal deviation Chest: No chest wall pain w good excursion CV:  Pulses intact.  Regular rhythm MS: Normal AROM mjr joints.  No obvious deformity Abdomen: Soft.  Nondistended.  Mildly tender at incisions only.  No evidence of peritonitis.  No incarcerated hernias. Ext:  SCDs BLE.  No mjr edema.  No cyanosis Skin: No petechiae / purpura  Past Medical History:  Diagnosis Date  . CAD (coronary artery disease), native coronary artery    10/18 PCI/DES to mRCA, and OM, with total occlusion of dLCx with collaterals.   . Essential hypertension   . GERD (gastroesophageal reflux disease)   . History of kidney stones   . Hyperlipemia   . SVT (supraventricular tachycardia) (HCC) 2015   Converted with adenosine  . Type 2 diabetes mellitus (HCC)     Past Surgical History:  Procedure Laterality Date  . CORONARY ANGIOPLASTY WITH STENT PLACEMENT  07/15/2017   "2 stents"  . LAPAROSCOPIC APPENDECTOMY N/A 11/12/2017   Procedure: APPENDECTOMY LAPAROSCOPIC, REPAIR OF INCARCERATED INCISIONAL HERNIA;  Surgeon: Karie Soda, MD;  Location: [facility]S;  Service: General;  Laterality: N/A;  . LAPAROSCOPIC CHOLECYSTECTOMY    . LEFT HEART CATH AND CORONARY ANGIOGRAPHY N/A 07/15/2017   Procedure: LEFT HEART CATH AND CORONARY ANGIOGRAPHY;  Surgeon: Tonny Bollman, MD;  Location: [facility] INVASIVE CV LAB;  Service: Cardiovascular;  Laterality: N/A;    Social History   Socioeconomic  History  . Marital status: Married    Spouse name: Not on file  . Number of children: Not on file  . Years of education: Not on file  . Highest education level: Not on file  Social Needs  . Financial resource strain: Not on file  . Food insecurity - worry: Not on file  . Food insecurity - inability: Not on file  . Transportation needs - medical: Not on file  . Transportation needs - non-medical: Not on file  Occupational History  . Not on file  Tobacco Use  . Smoking status: Current Some Day Smoker    Packs/day: 0.25    Years: 26.00    Pack years: 6.50    Types: Cigarettes  . Smokeless tobacco: Never Used  Substance and Sexual Activity  . Alcohol use: No  . Drug use: No  . Sexual activity: Yes    Birth control/protection: None  Other Topics Concern  . Not on file  Social History Narrative  . Not on file    Family History  Problem Relation Age of Onset  . Heart disease Mother 31       CABG  . Diabetes Mother   . Gout Mother   . Heart attack Father 48    Current Facility-Administered Medications  Medication Dose Route Frequency Provider Last Rate Last Dose  . 0.9 %  sodium chloride infusion   Intravenous Continuous Karie Soda, MD 50 mL/hr at 11/13/17 0502 1,000 mL at 11/13/17 0502  . 0.9 %  sodium chloride infusion  250 mL Intravenous PRN Karie Soda, MD      . acetaminophen (TYLENOL) tablet 1,000 mg  1,000 mg Oral Trixie Deis, MD   1,000 mg at 11/13/17 0501  . ALPRAZolam Prudy Feeler) tablet 1 mg  1 mg Oral Daily PRN Karie Soda, MD      . alum & mag hydroxide-simeth (MAALOX/MYLANTA) 200-200-20 MG/5ML  suspension 30 mL  30 mL Oral Q6H PRN Karie Soda, MD      . aspirin EC tablet 81 mg  81 mg Oral Daily Karie Soda, MD   81 mg at 11/12/17 1412  . atorvastatin (LIPITOR) tablet 80 mg  80 mg Oral Daily Karie Soda, MD   80 mg at 11/12/17 1412  . bisacodyl (DULCOLAX) suppository 10 mg  10 mg Rectal Q12H PRN Karie Soda, MD      . carvedilol (COREG) tablet  3.125 mg  3.125 mg Oral BID Karie Soda, MD   3.125 mg at 11/12/17 2223  . diphenhydrAMINE (BENADRYL) 12.5 MG/5ML elixir 12.5 mg  12.5 mg Oral Q6H PRN Karie Soda, MD       Or  . diphenhydrAMINE (BENADRYL) injection 12.5 mg  12.5 mg Intravenous Q6H PRN Karie Soda, MD      . enoxaparin (LOVENOX) injection 40 mg  40 mg Subcutaneous Q24H Kunaal Walkins, Viviann Spare, MD      . ezetimibe (ZETIA) tablet 10 mg  10 mg Oral Daily Karie Soda, MD   10 mg at 11/12/17 1412  . feeding supplement (GLUCERNA SHAKE) (GLUCERNA SHAKE) liquid 237 mL  237 mL Oral BID BM Karie Soda, MD   237 mL at 11/12/17 1410  . gabapentin (NEURONTIN) capsule 300 mg  300 mg Oral Laurena Slimmer, MD   300 mg at 11/12/17 2223  . guaiFENesin-dextromethorphan (ROBITUSSIN DM) 100-10 MG/5ML syrup 10 mL  10 mL Oral Q4H PRN Karie Soda, MD      . hydrALAZINE (APRESOLINE) injection 5-20 mg  5-20 mg Intravenous Q4H PRN Karie Soda, MD      . hydrocortisone (ANUSOL-HC) 2.5 % rectal cream 1 application  1 application Topical QID PRN Karie Soda, MD      . hydrocortisone cream 1 % 1 application  1 application Topical TID PRN Karie Soda, MD      . HYDROmorphone (DILAUDID) injection 0.5-2 mg  0.5-2 mg Intravenous Q2H PRN Karie Soda, MD      . insulin aspart (novoLOG) injection 0-15 Units  0-15 Units Subcutaneous TID Derek Jack Karie Soda, MD   5 Units at 11/12/17 1718  . insulin aspart (novoLOG) injection 0-5 Units  0-5 Units Subcutaneous Laurena Slimmer, MD   3 Units at 11/12/17 2224  . insulin detemir (LEVEMIR) injection 60 Units  60 Units Subcutaneous Q2200 Karie Soda, MD   60 Units at 11/12/17 2224  . lactated ringers bolus 1,000 mL  1,000 mL Intravenous Q8H PRN Karie Soda, MD      . lactated ringers infusion 1,000 mL  1,000 mL Intravenous Q8H PRN Daichi Moris, Viviann Spare, MD      . lip balm (CARMEX) ointment 1 application  1 application Topical BID Karie Soda, MD   1 application at 11/12/17 2225  . lisinopril (PRINIVIL,ZESTRIL) tablet  40 mg  40 mg Oral Daily Karie Soda, MD   40 mg at 11/12/17 1410  . magic mouthwash  15 mL Oral QID PRN Karie Soda, MD      . menthol-cetylpyridinium (CEPACOL) lozenge 3 mg  1 lozenge Oral PRN Karie Soda, MD      . metFORMIN (GLUCOPHAGE-XR) 24 hr tablet 1,000 mg  1,000 mg Oral BID WC Karie Soda, MD   1,000 mg at 11/12/17 1718  . methocarbamol (ROBAXIN) 1,000 mg in dextrose 5 % 50 mL IVPB  1,000 mg Intravenous Q6H PRN Karie Soda, MD      . methocarbamol (ROBAXIN) tablet 750 mg  750 mg Oral Q6H  PRN Karie Soda, MD      . metoCLOPramide (REGLAN) injection 10 mg  10 mg Intravenous Q6H PRN Karie Soda, MD      . metoprolol tartrate (LOPRESSOR) injection 5 mg  5 mg Intravenous Q6H PRN Karie Soda, MD      . nitroGLYCERIN (NITROSTAT) SL tablet 0.4 mg  0.4 mg Sublingual Q5 Min x 3 PRN Karie Soda, MD      . omega-3 acid ethyl esters (LOVAZA) capsule 1 g  1 g Oral BID Karie Soda, MD   1 g at 11/12/17 2223  . ondansetron (ZOFRAN-ODT) disintegrating tablet 4 mg  4 mg Oral Q6H PRN Karie Soda, MD       Or  . ondansetron (ZOFRAN) injection 4 mg  4 mg Intravenous Q6H PRN Karie Soda, MD      . phenol (CHLORASEPTIC) mouth spray 1-2 spray  1-2 spray Mouth/Throat PRN Karie Soda, MD      . polyethylene glycol (MIRALAX / GLYCOLAX) packet 17 g  17 g Oral Daily PRN Karie Soda, MD      . prochlorperazine (COMPAZINE) tablet 10 mg  10 mg Oral Q6H PRN Karie Soda, MD       Or  . prochlorperazine (COMPAZINE) injection 5-10 mg  5-10 mg Intravenous Q6H PRN Karie Soda, MD      . psyllium (HYDROCIL/METAMUCIL) packet 1 packet  1 packet Oral BID Karie Soda, MD   1 packet at 11/12/17 2223  . saccharomyces boulardii (FLORASTOR) capsule 250 mg  250 mg Oral BID Karie Soda, MD   250 mg at 11/12/17 2223  . simethicone (MYLICON) chewable tablet 40 mg  40 mg Oral Q6H PRN Karie Soda, MD      . sodium chloride flush (NS) 0.9 % injection 3 mL  3 mL Intravenous Catha Gosselin, MD   3 mL  at 11/12/17 2225  . sodium chloride flush (NS) 0.9 % injection 3 mL  3 mL Intravenous PRN Karie Soda, MD      . traMADol Janean Sark) tablet 50-100 mg  50-100 mg Oral Q6H PRN Karie Soda, MD   100 mg at 11/12/17 2233     Allergies  Allergen Reactions  . Bee Venom Anaphylaxis and Swelling  . Penicillins Hives, Itching and Rash    Has patient had a PCN reaction causing immediate rash, facial/tongue/throat swelling, SOB or lightheadedness with hypotension: No Has patient had a PCN reaction causing severe rash involving mucus membranes or skin necrosis: No Has patient had a PCN reaction that required hospitalization: No Has patient had a PCN reaction occurring within the last 10 years: No If all of the above answers are "NO", then may proceed with Cephalosporin use.     Signed: Lorenso Courier, M.D., F.A.C.S. [facility] [address], [address] [place], [place] [address] [phone] Main / Paging   11/13/2017, 7:50 AM

## 2017-11-13 NOTE — Progress Notes (Signed)
Discharge and medication instructions reviewed with patient and spouse. Questions answered and both deny further questions. One prescription giben to patient. Spouse is driving patient home. Lina SarBeth Aissata Wilmore, RN

## 2017-11-26 ENCOUNTER — Telehealth: Payer: Self-pay | Admitting: "Endocrinology

## 2017-11-26 MED ORDER — INSULIN DETEMIR 100 UNIT/ML FLEXPEN: 70.0000 [IU] | PEN_INJECTOR | Freq: Every day | SUBCUTANEOUS | 0 refills | Status: DC

## 2017-11-26 NOTE — Telephone Encounter (Signed)
Rx sent 

## 2017-11-26 NOTE — Telephone Encounter (Signed)
Lance [name] is stating that he needs a 90 day supply of Insulin Detemir (LEVEMIR FLEXPEN) 100 UNIT/ML Pen [facility] [place] for [employer] purposes, please advise?

## 2017-12-16 NOTE — Research (Signed)
OPTIMIZE Protocol labs  Component     Latest Ref Rng & Units 07/15/2017 07/15/2017 07/16/2017         1:52 PM Pre PCI  9:29 PM Post PCI 4-12 03:40 Post PCI 12-20  Troponin I     <0.03 ng/mL <0.03 <0.03 0.04 (HH)

## 2017-12-30 ENCOUNTER — Telehealth: Payer: Self-pay | Admitting: *Deleted

## 2017-12-30 NOTE — Telephone Encounter (Signed)
OPTIMIZE  6 MONTH FOLLOW UP  PT NUMBER: [id number] PT INTIALS: JCE  DATE: 12/16/2017    Type of Visit: []  Office visit [x]  Telephone visit   Angina Status: [x]  asymptomatic / free of symptoms    []  silent ischemia    []  stable angina    []  unstable angina   If silent ischemia: []  EKG     []  Echo     []  ST-Holter     []  Treadmill stress test     []  Nuclear     []  Other  CCS classification: Choose an item.  [place] Classification: Choose an item.  UPDATE THE MEDICATION LOG WITH ANY CHANGES FROM THE NEXT 3 QUESITONS  Did the subject continue on the appropriate course of aspirin:  [x]  YES      []  NO  Did the subject continue on the appropriate course of antiplatelet therapy:  [x]  YES   [] NO  Did any of the subject's daily cardiovascular or antiplatelet medications change: [] YES    [x]  NO (If yes update the medication log)  Did any new adverse events occur since the last follow up:    []  YES   [x]  NO  (If yes complete an Adverse Event form)

## 2018-01-11 ENCOUNTER — Other Ambulatory Visit: Payer: Self-pay

## 2018-01-11 MED ORDER — INSULIN DETEMIR 100 UNIT/ML FLEXPEN: 80.0000 [IU] | PEN_INJECTOR | Freq: Every day | SUBCUTANEOUS | 0 refills | Status: DC

## 2018-01-22 DIAGNOSIS — E1165 Type 2 diabetes mellitus with hyperglycemia: Secondary | ICD-10-CM | POA: Diagnosis not present

## 2018-01-23 LAB — COMPLETE METABOLIC PANEL WITH GFR
AG Ratio: 2.2 (calc) (ref 1.0–2.5)
ALBUMIN MSPROF: 4.3 g/dL (ref 3.6–5.1)
ALT: 41 U/L (ref 9–46)
AST: 23 U/L (ref 10–40)
Alkaline phosphatase (APISO): 82 U/L (ref 40–115)
BILIRUBIN TOTAL: 0.6 mg/dL (ref 0.2–1.2)
BUN: 14 mg/dL (ref 7–25)
CHLORIDE: 101 mmol/L (ref 98–110)
CO2: 28 mmol/L (ref 20–32)
Calcium: 9.6 mg/dL (ref 8.6–10.3)
Creat: 0.94 mg/dL (ref 0.60–1.35)
GFR, EST AFRICAN AMERICAN: 116 mL/min/{1.73_m2} (ref >=60)
GFR, Est Non African American: 100 mL/min/{1.73_m2} (ref >=60)
GLUCOSE: 156 mg/dL — AB (ref 65–99)
Globulin: 2 g/dL (calc) (ref 1.9–3.7)
Potassium: 4.4 mmol/L (ref 3.5–5.3)
SODIUM: 137 mmol/L (ref 135–146)
TOTAL PROTEIN: 6.3 g/dL (ref 6.1–8.1)

## 2018-01-23 LAB — HEMOGLOBIN A1C
EAG (MMOL/L): 10.1 (calc)
Hgb A1c MFr Bld: 8 % of total Hgb — ABNORMAL HIGH (ref <5.7)
Mean Plasma Glucose: 183 (calc)

## 2018-01-25 ENCOUNTER — Ambulatory Visit (INDEPENDENT_AMBULATORY_CARE_PROVIDER_SITE_OTHER): Payer: 59 | Admitting: "Endocrinology

## 2018-01-25 ENCOUNTER — Encounter: Payer: Self-pay | Admitting: "Endocrinology

## 2018-01-25 VITALS — BP 137/80 | HR 67 | Ht 71.0 in | Wt 268.0 lb

## 2018-01-25 DIAGNOSIS — E782 Mixed hyperlipidemia: Secondary | ICD-10-CM | POA: Diagnosis not present

## 2018-01-25 DIAGNOSIS — E1165 Type 2 diabetes mellitus with hyperglycemia: Secondary | ICD-10-CM

## 2018-01-25 DIAGNOSIS — I10 Essential (primary) hypertension: Secondary | ICD-10-CM

## 2018-01-25 NOTE — Patient Instructions (Signed)

## 2018-01-25 NOTE — Progress Notes (Signed)
Subjective:    Patient ID: Lance [name], male    DOB: Oct 03, 1976. Patient is being seen in consultation for management of diabetes requested by  [facility]  Past Medical History:  Diagnosis Date  . CAD (coronary artery disease), native coronary artery    10/18 PCI/DES to mRCA, and OM, with total occlusion of dLCx with collaterals.   . Essential hypertension   . GERD (gastroesophageal reflux disease)   . History of kidney stones   . Hyperlipemia   . SVT (supraventricular tachycardia) (HCC) 2015   Converted with adenosine  . Type 2 diabetes mellitus (HCC)    Past Surgical History:  Procedure Laterality Date  . CORONARY ANGIOPLASTY WITH STENT PLACEMENT  07/15/2017   "2 stents"  . LAPAROSCOPIC APPENDECTOMY N/A 11/12/2017   Procedure: APPENDECTOMY LAPAROSCOPIC, REPAIR OF INCARCERATED INCISIONAL HERNIA;  Surgeon: Karie Soda, MD;  Location: [facility];  Service: General;  Laterality: N/A;  . LAPAROSCOPIC CHOLECYSTECTOMY    . LEFT HEART CATH AND CORONARY ANGIOGRAPHY N/A 07/15/2017   Procedure: LEFT HEART CATH AND CORONARY ANGIOGRAPHY;  Surgeon: Tonny Bollman, MD;  Location: [facility] INVASIVE CV LAB;  Service: Cardiovascular;  Laterality: N/A;   Social History   Socioeconomic History  . Marital status: Married    Spouse name: Not on file  . Number of children: Not on file  . Years of education: Not on file  . Highest education level: Not on file  Occupational History  . Not on file  Social Needs  . Financial resource strain: Not on file  . Food insecurity:    Worry: Not on file    Inability: Not on file  . Transportation needs:    Medical: Not on file    Non-medical: Not on file  Tobacco Use  . Smoking status: Current Some Day Smoker    Packs/day: 0.25    Years: 26.00    Pack years: 6.50    Types: Cigarettes  . Smokeless tobacco: Never Used  Substance and Sexual Activity  . Alcohol use: No  . Drug use: No  . Sexual activity: Yes    Birth control/protection:  None  Lifestyle  . Physical activity:    Days per week: Not on file    Minutes per session: Not on file  . Stress: Not on file  Relationships  . Social connections:    Talks on phone: Not on file    Gets together: Not on file    Attends religious service: Not on file    Active member of club or organization: Not on file    Attends meetings of clubs or organizations: Not on file    Relationship status: Not on file  Other Topics Concern  . Not on file  Social History Narrative  . Not on file   Outpatient Encounter Medications as of 01/25/2018  Medication Sig  . Insulin Lispro (HUMALOG KWIKPEN [place]) Inject 10-16 Units into the skin 3 (three) times daily before meals.  . ALPRAZolam (XANAX) 1 MG tablet Take 1 mg by mouth daily as needed for anxiety.  Marland Kitchen aspirin EC 81 MG tablet Take 81 mg by mouth daily.  Marland Kitchen atorvastatin (LIPITOR) 80 MG tablet Take 1 tablet (80 mg total) by mouth daily. (Patient taking differently: Take 80 mg by mouth at bedtime. )  . carvedilol (COREG) 3.125 MG tablet Take 1 tablet (3.125 mg total) by mouth 2 (two) times daily.  . clopidogrel (PLAVIX) 75 MG tablet Take 1 tablet (75 mg total)  by mouth daily with breakfast.  . ezetimibe (ZETIA) 10 MG tablet Take 1 tablet (10 mg total) by mouth daily.  . Insulin Detemir (LEVEMIR FLEXPEN) 100 UNIT/ML Pen Inject 80 Units into the skin daily at 10 pm.  . lisinopril (PRINIVIL,ZESTRIL) 40 MG tablet Take 40 mg by mouth daily.  . metFORMIN (GLUCOPHAGE-XR) 500 MG 24 hr tablet Take 500 mg by mouth 2 (two) times daily.  . methocarbamol (ROBAXIN) 750 MG tablet Take 1 tablet (750 mg total) by mouth 4 (four) times daily as needed (use for muscle cramps/pain).  . nitroGLYCERIN (NITROSTAT) 0.4 MG SL tablet Place 1 tablet (0.4 mg total) under the tongue every 5 (five) minutes x 3 doses as needed for chest pain. (Patient not taking: Reported on 12/30/2017)  . Omega-3 1000 MG CAPS Take 1,000 mg by mouth 2 (two) times daily.  . traMADol (ULTRAM) 50  MG tablet Take 1-2 tablets (50-100 mg total) by mouth every 6 (six) hours as needed for moderate pain or severe pain.   No facility-administered encounter medications on file as of 01/25/2018.    ALLERGIES: Allergies  Allergen Reactions  . Bee Venom Anaphylaxis and Swelling  . Penicillins Hives, Itching and Rash    Has patient had a PCN reaction causing immediate rash, facial/tongue/throat swelling, SOB or lightheadedness with hypotension: No Has patient had a PCN reaction causing severe rash involving mucus membranes or skin necrosis: No Has patient had a PCN reaction that required hospitalization: No Has patient had a PCN reaction occurring within the last 10 years: No If all of the above answers are "NO", then may proceed with Cephalosporin use.    VACCINATION STATUS:  There is no immunization history on file for this patient.  Diabetes  He presents for his follow-up diabetic visit. He has type 2 diabetes mellitus. Onset time: He was diagnosed at approximate age of 35 years. His disease course has been worsening (Was recently hospitalized due to appendicitis after 2 months of antibiotic treatment.  He is status post appendectomy.  ). There are no hypoglycemic associated symptoms. Pertinent negatives for hypoglycemia include no confusion, headaches, pallor or seizures. Associated symptoms include blurred vision, polydipsia and polyuria. Pertinent negatives for diabetes include no chest pain, no fatigue, no polyphagia and no weakness. There are no hypoglycemic complications. Symptoms are worsening. There are no diabetic complications. Risk factors for coronary artery disease include diabetes mellitus, dyslipidemia, family history, hypertension, male sex, obesity, tobacco exposure and sedentary lifestyle. Current diabetic treatment includes intensive insulin program (He is on Levemir 68 units BID and Humalog 10 units TIDAC.). His weight is increasing steadily. He is following a generally  unhealthy diet. When asked about meal planning, he reported none. He has not had a previous visit with a dietitian. He rarely participates in exercise. His home blood glucose trend is decreasing steadily. His breakfast blood glucose range is generally >200 mg/dl. His lunch blood glucose range is generally 180-200 mg/dl. His dinner blood glucose range is generally 180-200 mg/dl. His bedtime blood glucose range is generally 180-200 mg/dl. His overall blood glucose range is 180-200 mg/dl. An ACE inhibitor/angiotensin II receptor blocker is being taken. He does not see a podiatrist.Eye exam is not current.  Hyperlipidemia  This is a chronic problem. The current episode started more than 1 year ago. The problem is controlled. Exacerbating diseases include diabetes and obesity. Pertinent negatives include no chest pain, myalgias or shortness of breath. Current antihyperlipidemic treatment includes statins and bile acid squestrants. The current treatment provides  significant improvement of lipids. Risk factors for coronary artery disease include diabetes mellitus, dyslipidemia, hypertension, male sex, obesity and a sedentary lifestyle.  Hypertension  This is a chronic problem. The current episode started more than 1 year ago. Associated symptoms include blurred vision. Pertinent negatives include no chest pain, headaches, neck pain, palpitations or shortness of breath. Risk factors for coronary artery disease include dyslipidemia, diabetes mellitus, male gender, smoking/tobacco exposure and sedentary lifestyle. Past treatments include ACE inhibitors.     Review of Systems  Constitutional: Negative for chills, fatigue, fever and unexpected weight change.  HENT: Negative for dental problem, mouth sores and trouble swallowing.   Eyes: Positive for blurred vision. Negative for visual disturbance.  Respiratory: Negative for cough, choking, chest tightness, shortness of breath and wheezing.   Cardiovascular:  Negative for chest pain, palpitations and leg swelling.  Gastrointestinal: Negative for abdominal distention, abdominal pain, constipation, diarrhea, nausea and vomiting.  Endocrine: Positive for polydipsia and polyuria. Negative for polyphagia.  Genitourinary: Negative for dysuria, flank pain, hematuria and urgency.  Musculoskeletal: Negative for back pain, gait problem, myalgias and neck pain.  Skin: Negative for pallor, rash and wound.  Neurological: Negative for seizures, syncope, weakness, numbness and headaches.  Psychiatric/Behavioral: Negative.  Negative for confusion and dysphoric mood.    Objective:    BP 137/80   Pulse 67   Ht 5\' 11"  (1.803 m)   Wt 268 lb (121.6 kg)   BMI 37.38 kg/m   Wt Readings from Last 3 Encounters:  01/25/18 268 lb (121.6 kg)  11/12/17 262 lb (118.8 kg)  11/11/17 262 lb 6 oz (119 kg)    Physical Exam  Constitutional: He is oriented to person, place, and time. He appears well-developed and well-nourished. He is cooperative. No distress.  HENT:  Head: Normocephalic and atraumatic.  Eyes: EOM are normal.  Neck: Normal range of motion. Neck supple. No tracheal deviation present. No thyromegaly present.  Cardiovascular: Normal rate, S1 normal, S2 normal and normal heart sounds. Exam reveals no gallop.  No murmur heard. Pulses:      Dorsalis pedis pulses are 1+ on the right side, and 1+ on the left side.       Posterior tibial pulses are 1+ on the right side, and 1+ on the left side.  Pulmonary/Chest: Breath sounds normal. No respiratory distress. He has no wheezes.  Abdominal: Soft. Bowel sounds are normal. He exhibits no distension. There is no tenderness. There is no guarding and no CVA tenderness.  Musculoskeletal: He exhibits no edema.       Right shoulder: He exhibits no swelling and no deformity.  Neurological: He is alert and oriented to person, place, and time. He has normal strength and normal reflexes. No cranial nerve deficit or sensory  deficit. Gait normal.  Skin: Skin is warm and dry. No rash noted. No cyanosis. Nails show no clubbing.  Psychiatric: He has a normal mood and affect. His speech is normal and behavior is normal. Judgment and thought content normal. Cognition and memory are normal.   CMP ( most recent) CMP     Component Value Date/Time   NA 137 01/22/2018 0909   NA 141 07/07/2017 0837   K 4.4 01/22/2018 0909   CL 101 01/22/2018 0909   CO2 28 01/22/2018 0909   GLUCOSE 156 (H) 01/22/2018 0909   BUN 14 01/22/2018 0909   BUN 15 07/07/2017 0837   CREATININE 0.94 01/22/2018 0909   CALCIUM 9.6 01/22/2018 0909   PROT 6.3 01/22/2018  0909   ALBUMIN 4.0 10/05/2017 1515   AST 23 01/22/2018 0909   ALT 41 01/22/2018 0909   ALKPHOS 66 10/05/2017 1515   BILITOT 0.6 01/22/2018 0909   GFRNONAA 100 01/22/2018 0909   GFRAA 116 01/22/2018 0909     Diabetic Labs (most recent): Lab Results  Component Value Date   HGBA1C 8.0 (H) 01/22/2018   HGBA1C 6.8 (H) 10/10/2017   HGBA1C 14.7 (H) 05/10/2017     Lipid Panel ( most recent) Lipid Panel     Component Value Date/Time   CHOL 88 10/10/2017 0929   TRIG 127 10/10/2017 0929   HDL 23 (L) 10/10/2017 0929   CHOLHDL 3.8 10/10/2017 0929   VLDL UNABLE TO CALCULATE IF TRIGLYCERIDE OVER 400 mg/dL 16/10/960408/03/2017 [id number]   LDLCALC 44 10/10/2017 0929     Assessment & Plan:   1. Uncontrolled type 2 diabetes mellitus with chronic kidney disease, with long-term current use of insulin, unspecified CKD stage (HCC)  - Patient has currently uncontrolled symptomatic type 2 DM since  42 years of age. - He came with elevated A1c of 8% along with significantly above target blood glucose readings.  This may be due to his recent intra-abdominal infection with appendicitis which required antibiotic treatment for 7060-month prior to his appendectomy.  -He attempted to controlled by using Humalog on end of along with his Levemir.  Recent labs reviewed. -his diabetes is complicated by  obesity/sedentary life, chronic heavy smoking and Joellyn HaffJustin C [place] remains at a high risk for more acute and chronic complications which include CAD, CVA, CKD, retinopathy, and neuropathy. These are all discussed in detail with the patient.  - I have counseled him on diet management and weight loss, by adopting a carbohydrate restricted/protein rich diet.  -  Suggestion is made for him to avoid simple carbohydrates  from his diet including Cakes, Sweet Desserts / Pastries, Ice Cream, Soda (diet and regular), Sweet Tea, Candies, Chips, Cookies, Store Bought Juices, Alcohol in Excess of  1-2 drinks a day, Artificial Sweeteners, and "Sugar-free" Products. This will help patient to have stable blood glucose profile and potentially avoid unintended weight gain.   - I encouraged him to switch to  unprocessed or minimally processed complex starch and increased protein intake (animal or plant source), fruits, and vegetables.  - he is advised to stick to a routine mealtimes to eat 3 meals  a day and avoid unnecessary snacks ( to snack only to correct hypoglycemia).  - His consult with  Norm SaltPenny Crumpton, CDE is pending for individualized diabetes education.  - I have approached him with the following individualized plan to manage diabetes and patient agrees:   - Based on his significantly above target blood glucose profile and high A1c of 8%, he is approached for brief intensification of treatment with basal/bolus insulin.  He agrees with this plan.  -I advised him to continue Levemir 80 units nightly, resume Humalog 10-16 units 3 times daily before meals, associated with strict monitoring of blood glucose 4 times a day-before meals and at bedtime and return in 2 weeks for reevaluation.  -Patient is encouraged to call clinic for blood glucose levels less than 70 or above 200 mg /dl. - He has tolerated metformin ER, I advised him to lower Metformin ER to 500 mg p.o. twice daily.   - he will be considered for  incretin therapy now that his hypertriglyceridemia has resolved.  - Patient specific target  A1c;  LDL, HDL, Triglycerides, and  Waist Circumference  were discussed in detail.  2) BP/HTN: uncontrolled. I advised him to continue  current medications including ACEI/ARB. 3) Lipids: Showing significant improvement, triglycerides improving from 1145 to 127. Patient is advised to continue statins, Lovaza, and ezetimibe. 4)  Weight/Diet: CDE Consult has been initiated -  consult pending , exercise, and detailed carbohydrates information provided.  5) Chronic Care/Health Maintenance:  -he  is on ACEI/ARB and Statin medications and  is encouraged to continue to follow up with Ophthalmology, Dentist,  Podiatrist at least yearly or according to recommendations, and advised to  quit smoking. I have recommended yearly flu vaccine and pneumonia vaccination at least every 5 years; moderate intensity exercise for up to 150 minutes weekly; and  sleep for at least 7 hours a day.  - I advised patient to maintain close follow up with [facility] for primary care needs.  - Time spent with the patient: 25 min, of which >50% was spent in reviewing his blood glucose logs , discussing his hypo- and hyper-glycemic episodes, reviewing his current and  previous labs and insulin doses and developing a plan to avoid hypo- and hyper-glycemia. Please refer to Patient Instructions for Blood Glucose Monitoring and Insulin/Medications Dosing Guide"  in media tab for additional information. [name] participated in the discussions, expressed understanding, and voiced agreement with the above plans.  All questions were answered to his satisfaction. he is encouraged to contact clinic should he have any questions or concerns prior to his return visit.  Follow up plan: - Return in about 2 weeks (around 02/08/2018) for follow up with meter and logs- no labs.  Marquis Lunch, MD Phone: [phone]  Fax: [phone]    01/25/2018, 5:42 PM

## 2018-02-02 NOTE — Progress Notes (Deleted)
Cardiology Office Note  Date: 02/02/2018   ID: Lance [name], DOB December 01, 1975, MRN [id number]  PCP: [facility]  Primary Cardiologist: Nona Dell, MD   No chief complaint on file.   History of Present Illness: Lance [name] is a 42 y.o. male last seen in January.  He did undergo laparoscopic appendectomy in February without perioperative cardiac complication.  Past Medical History:  Diagnosis Date  . CAD (coronary artery disease), native coronary artery    10/18 PCI/DES to mRCA, and OM, with total occlusion of dLCx with collaterals.   . Essential hypertension   . GERD (gastroesophageal reflux disease)   . History of kidney stones   . Hyperlipemia   . SVT (supraventricular tachycardia) (HCC) 2015   Converted with adenosine  . Type 2 diabetes mellitus (HCC)     Past Surgical History:  Procedure Laterality Date  . CORONARY ANGIOPLASTY WITH STENT PLACEMENT  07/15/2017   "2 stents"  . LAPAROSCOPIC APPENDECTOMY N/A 11/12/2017   Procedure: APPENDECTOMY LAPAROSCOPIC, REPAIR OF INCARCERATED INCISIONAL HERNIA;  Surgeon: Karie Soda, MD;  Location: [facility];  Service: General;  Laterality: N/A;  . LAPAROSCOPIC CHOLECYSTECTOMY    . LEFT HEART CATH AND CORONARY ANGIOGRAPHY N/A 07/15/2017   Procedure: LEFT HEART CATH AND CORONARY ANGIOGRAPHY;  Surgeon: Tonny Bollman, MD;  Location: [facility] INVASIVE CV LAB;  Service: Cardiovascular;  Laterality: N/A;    Current Outpatient Medications  Medication Sig Dispense Refill  . ALPRAZolam (XANAX) 1 MG tablet Take 1 mg by mouth daily as needed for anxiety.    Marland Kitchen aspirin EC 81 MG tablet Take 81 mg by mouth daily.    Marland Kitchen atorvastatin (LIPITOR) 80 MG tablet Take 1 tablet (80 mg total) by mouth daily. (Patient taking differently: Take 80 mg by mouth at bedtime. ) 30 tablet 0  . carvedilol (COREG) 3.125 MG tablet Take 1 tablet (3.125 mg total) by mouth 2 (two) times daily. 180 tablet 3  . clopidogrel (PLAVIX) 75 MG tablet Take 1 tablet  (75 mg total) by mouth daily with breakfast. 90 tablet 2  . ezetimibe (ZETIA) 10 MG tablet Take 1 tablet (10 mg total) by mouth daily. 30 tablet 0  . Insulin Detemir (LEVEMIR FLEXPEN) 100 UNIT/ML Pen Inject 80 Units into the skin daily at 10 pm. 75 mL 0  . Insulin Lispro (HUMALOG KWIKPEN [place]) Inject 10-16 Units into the skin 3 (three) times daily before meals.    Marland Kitchen lisinopril (PRINIVIL,ZESTRIL) 40 MG tablet Take 40 mg by mouth daily.    . metFORMIN (GLUCOPHAGE-XR) 500 MG 24 hr tablet Take 500 mg by mouth 2 (two) times daily.    . methocarbamol (ROBAXIN) 750 MG tablet Take 1 tablet (750 mg total) by mouth 4 (four) times daily as needed (use for muscle cramps/pain). 10 tablet 2  . nitroGLYCERIN (NITROSTAT) 0.4 MG SL tablet Place 1 tablet (0.4 mg total) under the tongue every 5 (five) minutes x 3 doses as needed for chest pain. (Patient not taking: Reported on 12/30/2017) 15 tablet 0  . Omega-3 1000 MG CAPS Take 1,000 mg by mouth 2 (two) times daily.    . traMADol (ULTRAM) 50 MG tablet Take 1-2 tablets (50-100 mg total) by mouth every 6 (six) hours as needed for moderate pain or severe pain. 30 tablet 0   No current facility-administered medications for this visit.    Allergies:  Bee venom and Penicillins   Social History: The patient  reports that he has been smoking cigarettes.  He has a 6.50 pack-year smoking history. He has never used smokeless tobacco. He reports that he does not drink alcohol or use drugs.   Family History: The patient's family history includes Diabetes in his mother; Gout in his mother; Heart attack (age of onset: 26) in his father; Heart disease (age of onset: 76) in his mother.   ROS:  Please see the history of present illness. Otherwise, complete review of systems is positive for {NONE DEFAULTED:18576::"none"}.  All other systems are reviewed and negative.   Physical Exam: VS:  There were no vitals taken for this visit., BMI There is no height or weight on file to  calculate BMI.  Wt Readings from Last 3 Encounters:  01/25/18 268 lb (121.6 kg)  11/12/17 262 lb (118.8 kg)  11/11/17 262 lb 6 oz (119 kg)    General: Patient appears comfortable at rest. HEENT: Conjunctiva and lids normal, oropharynx clear with moist mucosa. Neck: Supple, no elevated JVP or carotid bruits, no thyromegaly. Lungs: Clear to auscultation, nonlabored breathing at rest. Cardiac: Regular rate and rhythm, no S3 or significant systolic murmur, no pericardial rub. Abdomen: Soft, nontender, no hepatomegaly, bowel sounds present, no guarding or rebound. Extremities: No pitting edema, distal pulses 2+. Skin: Warm and dry. Musculoskeletal: No kyphosis. Neuropsychiatric: Alert and oriented x3, affect grossly appropriate.  ECG: I personally reviewed the tracing from 09/04/2017 which showed normal sinus rhythm.  Recent Labwork: 05/10/2017: Magnesium 1.9 10/10/2017: TSH 2.85 11/11/2017: Hemoglobin 15.0; Platelets 223 01/22/2018: ALT 41; AST 23; BUN 14; Creat 0.94; Potassium 4.4; Sodium 137     Component Value Date/Time   CHOL 88 10/10/2017 0929   TRIG 127 10/10/2017 0929   HDL 23 (L) 10/10/2017 0929   CHOLHDL 3.8 10/10/2017 0929   VLDL UNABLE TO CALCULATE IF TRIGLYCERIDE OVER 400 mg/dL 13/05/6577 4696   LDLCALC 44 10/10/2017 0929    Other Studies Reviewed Today:  Cardiac catheterization and PCI 07/15/2017: Conclusion   1. Three vessel CAD  Moderate eccentric proximal LAD stenosis with normal FFR analysis  Severe LCx/OM stenosis treated successfully with a Svelte DES through the Optimize clinical trial  Severe RCA stenosis (hemodynamically significant by FFR) treated successfully with a Svelte DES through the Optimize clinical trial 2. Normal/vigorous LV function with LVEF >65%  DAPT with ASA and plavix x 12 months minimum.   **Pt blinded to stent type**   Echocardiogram 05/11/2017: Study Conclusions  - Left ventricle: The cavity size was normal. Wall thickness  was normal. Systolic function was normal. The estimated ejection fraction was in the range of 55% to 60%. Left ventricular diastolic function parameters were normal. - Aortic valve: Calcified non coronary cusp. Valve area (VTI): 2.28 cm^2. Valve area (Vmax): 2.26 cm^2. Valve area (Vmean): 2.37 cm^2. - Left atrium: The atrium was moderately dilated. - Atrial septum: No defect or patent foramen ovale was identified.  Assessment and Plan:   Current medicines were reviewed with the patient today.  No orders of the defined types were placed in this encounter.   Disposition:  Signed, Jonelle Sidle, MD, [facility] 02/02/2018 3:32 PM    [facility] at [facility] [address], [place], [place] [address] Phone: [phone]; Fax: [phone]

## 2018-02-04 ENCOUNTER — Ambulatory Visit: Payer: 59 | Admitting: Cardiology

## 2018-02-08 ENCOUNTER — Encounter: Payer: Self-pay | Admitting: "Endocrinology

## 2018-02-08 ENCOUNTER — Ambulatory Visit (INDEPENDENT_AMBULATORY_CARE_PROVIDER_SITE_OTHER): Payer: 59 | Admitting: "Endocrinology

## 2018-02-08 VITALS — BP 120/76 | HR 72 | Ht 71.0 in | Wt 269.0 lb

## 2018-02-08 DIAGNOSIS — I10 Essential (primary) hypertension: Secondary | ICD-10-CM

## 2018-02-08 DIAGNOSIS — E1165 Type 2 diabetes mellitus with hyperglycemia: Secondary | ICD-10-CM

## 2018-02-08 DIAGNOSIS — E782 Mixed hyperlipidemia: Secondary | ICD-10-CM

## 2018-02-08 MED ORDER — INSULIN LISPRO 100 UNIT/ML (KWIKPEN): 10.0000 [IU] | PEN_INJECTOR | Freq: Three times a day (TID) | SUBCUTANEOUS | 5 refills | Status: DC

## 2018-02-08 MED ORDER — FREESTYLE LIBRE 14 DAY SENSOR MISC: 1.0000 | 2 refills | Status: DC

## 2018-02-08 MED ORDER — FREESTYLE LIBRE 14 DAY READER DEVI: 1.0000 | Freq: Once | 0 refills | Status: AC

## 2018-02-08 NOTE — Progress Notes (Signed)
Subjective:    Patient ID: Lance [name], male    DOB: [birth year]. Patient is being seen in consultation for management of diabetes requested by  [facility]  Past Medical History:  Diagnosis Date  . CAD (coronary artery disease), native coronary artery    10/18 PCI/DES to mRCA, and OM, with total occlusion of dLCx with collaterals.   . Essential hypertension   . GERD (gastroesophageal reflux disease)   . History of kidney stones   . Hyperlipemia   . SVT (supraventricular tachycardia) (HCC) 2015   Converted with adenosine  . Type 2 diabetes mellitus (HCC)    Past Surgical History:  Procedure Laterality Date  . CORONARY ANGIOPLASTY WITH STENT PLACEMENT  07/15/2017   "2 stents"  . LAPAROSCOPIC APPENDECTOMY N/A 11/12/2017   Procedure: APPENDECTOMY LAPAROSCOPIC, REPAIR OF INCARCERATED INCISIONAL HERNIA;  Surgeon: Karie Soda, MD;  Location: [facility];  Service: General;  Laterality: N/A;  . LAPAROSCOPIC CHOLECYSTECTOMY    . LEFT HEART CATH AND CORONARY ANGIOGRAPHY N/A 07/15/2017   Procedure: LEFT HEART CATH AND CORONARY ANGIOGRAPHY;  Surgeon: Tonny Bollman, MD;  Location: [facility] INVASIVE CV LAB;  Service: Cardiovascular;  Laterality: N/A;   Social History   Socioeconomic History  . Marital status: Married    Spouse name: Not on file  . Number of children: Not on file  . Years of education: Not on file  . Highest education level: Not on file  Occupational History  . Not on file  Social Needs  . Financial resource strain: Not on file  . Food insecurity:    Worry: Not on file    Inability: Not on file  . Transportation needs:    Medical: Not on file    Non-medical: Not on file  Tobacco Use  . Smoking status: Current Some Day Smoker    Packs/day: 0.25    Years: 26.00    Pack years: 6.50    Types: Cigarettes  . Smokeless tobacco: Never Used  Substance and Sexual Activity  . Alcohol use: No  . Drug use: No  . Sexual activity: Yes    Birth control/protection:  None  Lifestyle  . Physical activity:    Days per week: Not on file    Minutes per session: Not on file  . Stress: Not on file  Relationships  . Social connections:    Talks on phone: Not on file    Gets together: Not on file    Attends religious service: Not on file    Active member of club or organization: Not on file    Attends meetings of clubs or organizations: Not on file    Relationship status: Not on file  Other Topics Concern  . Not on file  Social History Narrative  . Not on file   Outpatient Encounter Medications as of 02/08/2018  Medication Sig  . ALPRAZolam (XANAX) 1 MG tablet Take 1 mg by mouth daily as needed for anxiety.  Marland Kitchen aspirin EC 81 MG tablet Take 81 mg by mouth daily.  Marland Kitchen atorvastatin (LIPITOR) 80 MG tablet Take 1 tablet (80 mg total) by mouth daily. (Patient taking differently: Take 80 mg by mouth at bedtime. )  . carvedilol (COREG) 3.125 MG tablet Take 1 tablet (3.125 mg total) by mouth 2 (two) times daily.  . clopidogrel (PLAVIX) 75 MG tablet Take 1 tablet (75 mg total) by mouth daily with breakfast.  . Continuous Blood Gluc Receiver (FREESTYLE LIBRE 14 DAY READER) DEVI 1 each  by Does not apply route once for 1 dose.  . Continuous Blood Gluc Sensor (FREESTYLE LIBRE 14 DAY SENSOR) MISC Inject 1 each into the skin every 14 (fourteen) days. Use as directed.  . ezetimibe (ZETIA) 10 MG tablet Take 1 tablet (10 mg total) by mouth daily.  . Insulin Detemir (LEVEMIR FLEXPEN) 100 UNIT/ML Pen Inject 80 Units into the skin daily at 10 pm.  . insulin lispro (HUMALOG KWIKPEN) 100 UNIT/ML KiwkPen Inject 0.1-0.16 mLs (10-16 Units total) into the skin 3 (three) times daily before meals.  Marland Kitchen lisinopril (PRINIVIL,ZESTRIL) 40 MG tablet Take 40 mg by mouth daily.  . metFORMIN (GLUCOPHAGE-XR) 500 MG 24 hr tablet Take 500 mg by mouth 2 (two) times daily.  . methocarbamol (ROBAXIN) 750 MG tablet Take 1 tablet (750 mg total) by mouth 4 (four) times daily as needed (use for muscle  cramps/pain).  . nitroGLYCERIN (NITROSTAT) 0.4 MG SL tablet Place 1 tablet (0.4 mg total) under the tongue every 5 (five) minutes x 3 doses as needed for chest pain. (Patient not taking: Reported on 12/30/2017)  . Omega-3 1000 MG CAPS Take 1,000 mg by mouth 2 (two) times daily.  . traMADol (ULTRAM) 50 MG tablet Take 1-2 tablets (50-100 mg total) by mouth every 6 (six) hours as needed for moderate pain or severe pain.  . [DISCONTINUED] Insulin Lispro (HUMALOG KWIKPEN [place]) Inject 10-16 Units into the skin 3 (three) times daily before meals.   No facility-administered encounter medications on file as of 02/08/2018.    ALLERGIES: Allergies  Allergen Reactions  . Bee Venom Anaphylaxis and Swelling  . Penicillins Hives, Itching and Rash    Has patient had a PCN reaction causing immediate rash, facial/tongue/throat swelling, SOB or lightheadedness with hypotension: No Has patient had a PCN reaction causing severe rash involving mucus membranes or skin necrosis: No Has patient had a PCN reaction that required hospitalization: No Has patient had a PCN reaction occurring within the last 10 years: No If all of the above answers are "NO", then may proceed with Cephalosporin use.    VACCINATION STATUS:  There is no immunization history on file for this patient.  Diabetes  He presents for his follow-up diabetic visit. He has type 2 diabetes mellitus. Onset time: He was diagnosed at approximate age of 35 years. His disease course has been improving (Was recently hospitalized due to appendicitis after 2 months of antibiotic treatment.  He is status post appendectomy.  ). There are no hypoglycemic associated symptoms. Pertinent negatives for hypoglycemia include no confusion, headaches, pallor or seizures. Associated symptoms include blurred vision, polydipsia and polyuria. Pertinent negatives for diabetes include no chest pain, no fatigue, no polyphagia and no weakness. There are no hypoglycemic complications.  Symptoms are improving. There are no diabetic complications. Risk factors for coronary artery disease include diabetes mellitus, dyslipidemia, family history, hypertension, male sex, obesity, tobacco exposure and sedentary lifestyle. Current diabetic treatment includes intensive insulin program (He is on Levemir 68 units BID and Humalog 10 units TIDAC.). His weight is stable. He is following a generally unhealthy diet. When asked about meal planning, he reported none. He has not had a previous visit with a dietitian. He rarely participates in exercise. His home blood glucose trend is decreasing steadily. His breakfast blood glucose range is generally 140-180 mg/dl. His lunch blood glucose range is generally 140-180 mg/dl. His dinner blood glucose range is generally 140-180 mg/dl. His bedtime blood glucose range is generally 140-180 mg/dl. His overall blood glucose range is  140-180 mg/dl. (He came with significant improvement in his blood glucose profile, averaging 169 over the last 14 days.) An ACE inhibitor/angiotensin II receptor blocker is being taken. He does not see a podiatrist.Eye exam is not current.  Hyperlipidemia  This is a chronic problem. The current episode started more than 1 year ago. The problem is controlled. Exacerbating diseases include diabetes and obesity. Pertinent negatives include no chest pain, myalgias or shortness of breath. Current antihyperlipidemic treatment includes statins and bile acid squestrants. The current treatment provides significant improvement of lipids. Risk factors for coronary artery disease include diabetes mellitus, dyslipidemia, hypertension, male sex, obesity and a sedentary lifestyle.  Hypertension  This is a chronic problem. The current episode started more than 1 year ago. Associated symptoms include blurred vision. Pertinent negatives include no chest pain, headaches, neck pain, palpitations or shortness of breath. Risk factors for coronary artery disease  include dyslipidemia, diabetes mellitus, male gender, smoking/tobacco exposure and sedentary lifestyle. Past treatments include ACE inhibitors.     Review of Systems  Constitutional: Negative for chills, fatigue, fever and unexpected weight change.  HENT: Negative for dental problem, mouth sores and trouble swallowing.   Eyes: Positive for blurred vision. Negative for visual disturbance.  Respiratory: Negative for cough, choking, chest tightness, shortness of breath and wheezing.   Cardiovascular: Negative for chest pain, palpitations and leg swelling.  Gastrointestinal: Negative for abdominal distention, abdominal pain, constipation, diarrhea, nausea and vomiting.  Endocrine: Positive for polydipsia and polyuria. Negative for polyphagia.  Genitourinary: Negative for dysuria, flank pain, hematuria and urgency.  Musculoskeletal: Negative for back pain, gait problem, myalgias and neck pain.  Skin: Negative for pallor, rash and wound.  Neurological: Negative for seizures, syncope, weakness, numbness and headaches.  Psychiatric/Behavioral: Negative.  Negative for confusion and dysphoric mood.    Objective:    BP 120/76   Pulse 72   Ht  (1.803 m)   Wt 269 lb (122 kg)   BMI 37.52 kg/m   Wt Readings from Last 3 Encounters:  02/08/18 269 lb (122 kg)  01/25/18 268 lb (121.6 kg)  11/12/17 262 lb (118.8 kg)    Physical Exam  Constitutional: He is oriented to person, place, and time. He appears well-developed. He is cooperative. No distress.  HENT:  Head: Normocephalic and atraumatic.  Eyes: EOM are normal.  Neck: Normal range of motion. Neck supple. No tracheal deviation present. No thyromegaly present.  Cardiovascular: Normal rate, S1 normal and S2 normal. Exam reveals no gallop.  No murmur heard. Pulses:      Dorsalis pedis pulses are 1+ on the right side, and 1+ on the left side.       Posterior tibial pulses are 1+ on the right side, and 1+ on the left side.   Pulmonary/Chest: Effort normal. No respiratory distress. He has no wheezes.  Abdominal: He exhibits no distension. There is no tenderness. There is no guarding and no CVA tenderness.  Musculoskeletal: He exhibits no edema.       Right shoulder: He exhibits no swelling and no deformity.  Neurological: He is alert and oriented to person, place, and time. He has normal strength and normal reflexes. No cranial nerve deficit or sensory deficit. Gait normal.  Skin: Skin is warm and dry. No rash noted. No cyanosis. Nails show no clubbing.  Psychiatric: He has a normal mood and affect. His speech is normal. Judgment normal. Cognition and memory are normal.   CMP ( most recent) CMP  Component Value Date/Time   NA 137 01/22/2018 0909   NA 141 07/07/2017 0837   K 4.4 01/22/2018 0909   CL 101 01/22/2018 0909   CO2 28 01/22/2018 0909   GLUCOSE 156 (H) 01/22/2018 0909   BUN 14 01/22/2018 0909   BUN 15 07/07/2017 0837   CREATININE 0.94 01/22/2018 0909   CALCIUM 9.6 01/22/2018 0909   PROT 6.3 01/22/2018 0909   ALBUMIN 4.0 10/05/2017 1515   AST 23 01/22/2018 0909   ALT 41 01/22/2018 0909   ALKPHOS 66 10/05/2017 1515   BILITOT 0.6 01/22/2018 0909   GFRNONAA 100 01/22/2018 0909   GFRAA 116 01/22/2018 0909     Diabetic Labs (most recent): Lab Results  Component Value Date   HGBA1C 8.0 (H) 01/22/2018   HGBA1C 6.8 (H) 10/10/2017   HGBA1C 14.7 (H) 05/10/2017     Lipid Panel ( most recent) Lipid Panel     Component Value Date/Time   CHOL 88 10/10/2017 0929   TRIG 127 10/10/2017 0929   HDL 23 (L) 10/10/2017 0929   CHOLHDL 3.8 10/10/2017 0929   VLDL UNABLE TO CALCULATE IF TRIGLYCERIDE OVER 400 mg/dL 16/07/9603 5409   LDLCALC 44 10/10/2017 0929     Assessment & Plan:   1. Uncontrolled type 2 diabetes mellitus with chronic kidney disease, with long-term current use of insulin, unspecified CKD stage (HCC)  - Patient has currently uncontrolled symptomatic type 2 DM since  42 years of  age. - He recently came with elevated A1c of 8% which required reinitiation of insulin treatment to which he has responded with control of glycemia post fasting and postprandially.   Recent labs reviewed. -his diabetes is complicated by obesity/sedentary life, chronic heavy smoking and RUVIM RISKO remains at a high risk for more acute and chronic complications which include CAD, CVA, CKD, retinopathy, and neuropathy. These are all discussed in detail with the patient.  - I have counseled him on diet management and weight loss, by adopting a carbohydrate restricted/protein rich diet.  -  Suggestion is made for him to avoid simple carbohydrates  from his diet including Cakes, Sweet Desserts / Pastries, Ice Cream, Soda (diet and regular), Sweet Tea, Candies, Chips, Cookies, Store Bought Juices, Alcohol in Excess of  1-2 drinks a day, Artificial Sweeteners, and "Sugar-free" Products. This will help patient to have stable blood glucose profile and potentially avoid unintended weight gain.   - I encouraged him to switch to  unprocessed or minimally processed complex starch and increased protein intake (animal or plant source), fruits, and vegetables.  - he is advised to stick to a routine mealtimes to eat 3 meals  a day and avoid unnecessary snacks ( to snack only to correct hypoglycemia).  - His consult with  Norm Salt, CDE is pending for individualized diabetes education.  - I have approached him with the following individualized plan to manage diabetes and patient agrees:   -His presentation is such that he will continue to require intensive treatment with basal/bolus insulin in order for him to maintain control of diabetes.  -I advised him to continue Levemir 80 units nightly, continue Humalog  Humalog 10-16 units 3 times daily before meals, associated with strict monitoring of blood glucose 4 times a day-before meals and at bedtime and return in 2 weeks for reevaluation.  -Patient is  encouraged to call clinic for blood glucose levels less than 70 or above 200 mg /dl. -He will benefit from continuous glucose monitoring.  I discussed and  initiated a prescription for the [facility] device for him. - He has tolerated metformin ER, I advised him to lower Metformin ER to 500 mg p.o. twice daily.   - he will be considered for incretin therapy now that his hypertriglyceridemia has resolved.  - Patient specific target  A1c;  LDL, HDL, Triglycerides, and  Waist Circumference were discussed in detail.  2) BP/HTN: His blood pressure is controlled to target.   I advised him to continue  current medications including ACEI/ARB. 3) Lipids: Showing significant improvement, triglycerides improving from 1145 to 127. Patient is advised to continue statins, Lovaza, and ezetimibe. 4)  Weight/Diet: CDE Consult has been initiated -  consult pending , exercise, and detailed carbohydrates information provided.  5) Chronic Care/Health Maintenance:  -he  is on ACEI/ARB and Statin medications and  is encouraged to continue to follow up with Ophthalmology, Dentist,  Podiatrist at least yearly or according to recommendations, and advised to  quit smoking. I have recommended yearly flu vaccine and pneumonia vaccination at least every 5 years; moderate intensity exercise for up to 150 minutes weekly; and  sleep for at least 7 hours a day.  - I advised patient to maintain close follow up with [facility] for primary care needs. - Time spent with the patient: 25 min, of which >50% was spent in reviewing his blood glucose logs , discussing his hypo- and hyper-glycemic episodes, reviewing his current and  previous labs and insulin doses and developing a plan to avoid hypo- and hyper-glycemia. Please refer to Patient Instructions for Blood Glucose Monitoring and Insulin/Medications Dosing Guide"  in media tab for additional information. [name] participated in the discussions, expressed  understanding, and voiced agreement with the above plans.  All questions were answered to his satisfaction. he is encouraged to contact clinic should he have any questions or concerns prior to his return visit.   Follow up plan: - Return in about 2 months (around 04/19/2018) for follow up with pre-visit labs, meter, and logs.  Marquis Lunch, MD Phone: [phone]  Fax: [phone]  -  This note was partially dictated with voice recognition software. Similar sounding words can be transcribed inadequately or may not  be corrected upon review.  02/08/2018, 5:06 PM

## 2018-02-08 NOTE — Patient Instructions (Signed)

## 2018-02-11 ENCOUNTER — Telehealth: Payer: Self-pay | Admitting: "Endocrinology

## 2018-02-11 ENCOUNTER — Other Ambulatory Visit: Payer: Self-pay

## 2018-02-11 MED ORDER — PEN NEEDLES 31G X 8 MM MISC: 1.0000 | Freq: Four times a day (QID) | 5 refills | Status: DC

## 2018-02-11 MED ORDER — FREESTYLE LIBRE 14 DAY SENSOR MISC: 1.0000 | 5 refills | Status: DC

## 2018-02-11 MED ORDER — INSULIN LISPRO 100 UNIT/ML (KWIKPEN): 10.0000 [IU] | PEN_INJECTOR | Freq: Three times a day (TID) | SUBCUTANEOUS | 3 refills | Status: DC

## 2018-02-11 NOTE — Telephone Encounter (Signed)
rx sent

## 2018-02-11 NOTE — Telephone Encounter (Signed)
Lance [name] called requesting refill on Humalog Pen Needles to be called to [facility], please advise?

## 2018-02-19 ENCOUNTER — Other Ambulatory Visit: Payer: Self-pay | Admitting: "Endocrinology

## 2018-02-19 ENCOUNTER — Telehealth: Payer: Self-pay | Admitting: "Endocrinology

## 2018-02-19 MED ORDER — PEN NEEDLES 31G X 8 MM MISC: 1.0000 | Freq: Four times a day (QID) | 5 refills | Status: DC

## 2018-02-19 NOTE — Telephone Encounter (Signed)
Lance [name] is calling stating that the pharmacy never got his Rx for a refill on his Needles that goes on the end of the Humalog Pens, Please advise?

## 2018-02-19 NOTE — Telephone Encounter (Signed)
Sent to [facility]

## 2018-02-27 ENCOUNTER — Other Ambulatory Visit: Payer: Self-pay

## 2018-02-27 ENCOUNTER — Encounter (HOSPITAL_COMMUNITY): Payer: Self-pay | Admitting: Emergency Medicine

## 2018-02-27 ENCOUNTER — Emergency Department (HOSPITAL_COMMUNITY)
Admission: EM | Admit: 2018-02-27 | Discharge: 2018-02-27 | Disposition: A | Discharge status: Home / Self Care | Payer: 59 | Attending: Emergency Medicine | Admitting: Emergency Medicine

## 2018-02-27 ENCOUNTER — Emergency Department (HOSPITAL_COMMUNITY): Payer: 59

## 2018-02-27 DIAGNOSIS — R079 Chest pain, unspecified: Secondary | ICD-10-CM | POA: Diagnosis not present

## 2018-02-27 DIAGNOSIS — E119 Type 2 diabetes mellitus without complications: Secondary | ICD-10-CM | POA: Diagnosis not present

## 2018-02-27 DIAGNOSIS — I471 Supraventricular tachycardia: Secondary | ICD-10-CM | POA: Diagnosis not present

## 2018-02-27 DIAGNOSIS — Z79899 Other long term (current) drug therapy: Secondary | ICD-10-CM | POA: Diagnosis not present

## 2018-02-27 DIAGNOSIS — Z7902 Long term (current) use of antithrombotics/antiplatelets: Secondary | ICD-10-CM | POA: Diagnosis not present

## 2018-02-27 DIAGNOSIS — E785 Hyperlipidemia, unspecified: Secondary | ICD-10-CM | POA: Insufficient documentation

## 2018-02-27 DIAGNOSIS — I2510 Atherosclerotic heart disease of native coronary artery without angina pectoris: Secondary | ICD-10-CM | POA: Diagnosis not present

## 2018-02-27 DIAGNOSIS — Z7982 Long term (current) use of aspirin: Secondary | ICD-10-CM | POA: Diagnosis not present

## 2018-02-27 DIAGNOSIS — Z794 Long term (current) use of insulin: Secondary | ICD-10-CM | POA: Insufficient documentation

## 2018-02-27 DIAGNOSIS — F17210 Nicotine dependence, cigarettes, uncomplicated: Secondary | ICD-10-CM | POA: Diagnosis not present

## 2018-02-27 DIAGNOSIS — R0602 Shortness of breath: Secondary | ICD-10-CM | POA: Diagnosis not present

## 2018-02-27 DIAGNOSIS — R0789 Other chest pain: Secondary | ICD-10-CM

## 2018-02-27 LAB — BASIC METABOLIC PANEL
ANION GAP: 6 (ref 5–15)
BUN: 14 mg/dL (ref 6–20)
CALCIUM: 9.2 mg/dL (ref 8.9–10.3)
CO2: 28 mmol/L (ref 22–32)
Chloride: 103 mmol/L (ref 101–111)
Creatinine, Ser: 0.93 mg/dL (ref 0.61–1.24)
GFR calc Af Amer: >60 mL/min (ref >60)
GLUCOSE: 142 mg/dL — AB (ref 65–99)
Potassium: 3.8 mmol/L (ref 3.5–5.1)
SODIUM: 137 mmol/L (ref 135–145)

## 2018-02-27 LAB — CBC
HCT: 43.4 % (ref 39.0–52.0)
HEMOGLOBIN: 14.4 g/dL (ref 13.0–17.0)
MCH: 29.9 pg (ref 26.0–34.0)
MCHC: 33.2 g/dL (ref 30.0–36.0)
MCV: 90.2 fL (ref 78.0–100.0)
Platelets: 195 10*3/uL (ref 150–400)
RBC: 4.81 MIL/uL (ref 4.22–5.81)
RDW: 12.8 % (ref 11.5–15.5)
WBC: 10.1 10*3/uL (ref 4.0–10.5)

## 2018-02-27 LAB — TROPONIN I: Troponin I: <0.03 ng/mL (ref <0.03)

## 2018-02-27 LAB — PROTIME-INR
INR: 0.96
PROTHROMBIN TIME: 12.6 s (ref 11.4–15.2)

## 2018-02-27 MED ORDER — PANTOPRAZOLE SODIUM 20 MG PO TBEC: 20.0000 mg | DELAYED_RELEASE_TABLET | Freq: Every day | ORAL | 0 refills | Status: DC

## 2018-02-27 MED ORDER — RANITIDINE HCL 150 MG PO TABS: 150.0000 mg | ORAL_TABLET | Freq: Two times a day (BID) | ORAL | 0 refills | Status: DC

## 2018-02-27 NOTE — ED Triage Notes (Signed)
Pt c/o intermittent, central, non-radiating CP with SOB x 3 days. Pt with 2 cardiac stents in the past.

## 2018-02-27 NOTE — ED Notes (Signed)
Patient transported to X-ray 

## 2018-02-27 NOTE — ED Provider Notes (Signed)
[facility] EMERGENCY DEPARTMENT Provider Note   CSN: [id number] Arrival date & time: 02/27/18  1023     History   Chief Complaint Chief Complaint  Patient presents with  . Chest Pain    HPI Lance [name] is a 42 y.o. male.  HPI  42 year old male, known history of coronary disease status post stenting in October 2018, known history of high blood pressure, history of SVT and type 2 diabetes.  The patient still smoke cigarettes.  He reports that for the last 4 days he has had some intermittent chest pain which is described as a heaviness in the middle of the chest which is nonradiating, lasting 1 or 2 minutes and then going away, sometimes it comes on at rest and sometimes with exertion, he is currently chest pain-free.  He says he gets short of breath when it occurs.  He denies any nausea vomiting swelling of the legs or any other symptoms.  Past Medical History:  Diagnosis Date  . CAD (coronary artery disease), native coronary artery    10/18 PCI/DES to mRCA, and OM, with total occlusion of dLCx with collaterals.   . Essential hypertension   . GERD (gastroesophageal reflux disease)   . History of kidney stones   . Hyperlipemia   . SVT (supraventricular tachycardia) (HCC) 2015   Converted with adenosine  . Type 2 diabetes mellitus [facility])     Patient Active Problem List   Diagnosis Date Noted  . Incarcerated incisional hernia s/p redeuction/primary repair 11/12/2017 11/12/2017  . Chronic anticoagulation (Plavix) 11/12/2017  . Chronic appendicitis s/p lap appendectomy 11/12/2017 09/05/2017  . S/P drug eluting coronary stent placement   . CAD (coronary artery disease) 07/15/2017  . Severe obesity with body mass index (BMI) of 35.0 to 39.9 with comorbidity (HCC) 05/20/2017  . Family history of early CAD 12/29/2016  . Type 2 diabetes mellitus, uncontrolled (HCC) 08/31/2016  . Mixed hyperlipidemia 08/31/2016  . Current smoker 08/31/2016  . Essential hypertension, benign 04/21/2013     Past Surgical History:  Procedure Laterality Date  . CORONARY ANGIOPLASTY WITH STENT PLACEMENT  07/15/2017   "2 stents"  . LAPAROSCOPIC APPENDECTOMY N/A 11/12/2017   Procedure: APPENDECTOMY LAPAROSCOPIC, REPAIR OF INCARCERATED INCISIONAL HERNIA;  Surgeon: Karie Soda, MD;  Location: [facility] ORS;  Service: General;  Laterality: N/A;  . LAPAROSCOPIC CHOLECYSTECTOMY    . LEFT HEART CATH AND CORONARY ANGIOGRAPHY N/A 07/15/2017   Procedure: LEFT HEART CATH AND CORONARY ANGIOGRAPHY;  Surgeon: Tonny Bollman, MD;  Location: [facility] INVASIVE CV LAB;  Service: Cardiovascular;  Laterality: N/A;        Home Medications    Prior to Admission medications   Medication Sig Start Date End Date Taking? Authorizing Provider  ALPRAZolam Prudy Feeler) 1 MG tablet Take 1 mg by mouth daily as needed for anxiety.   Yes [provider]  aspirin EC 81 MG tablet Take 81 mg by mouth daily.   Yes [provider]  atorvastatin (LIPITOR) 80 MG tablet Take 1 tablet (80 mg total) by mouth daily. Patient taking differently: Take 80 mg by mouth at bedtime.  09/01/16  Yes Standley Brooking, MD  carvedilol (COREG) 3.125 MG tablet Take 1 tablet (3.125 mg total) by mouth 2 (two) times daily. 09/22/17 02/27/18 Yes Jonelle Sidle, MD  clopidogrel (PLAVIX) 75 MG tablet Take 1 tablet (75 mg total) by mouth daily with breakfast. 07/16/17  Yes Laverda Page B, NP  ezetimibe (ZETIA) 10 MG tablet Take 1 tablet (10 mg total)  by mouth daily. 05/13/17  Yes Rodolph Bong, MD  Insulin Detemir (LEVEMIR FLEXPEN) 100 UNIT/ML Pen Inject 80 Units into the skin daily at 10 pm. 01/11/18  Yes Nida, Denman George, MD  insulin lispro (HUMALOG KWIKPEN) 100 UNIT/ML KiwkPen Inject 0.1-0.16 mLs (10-16 Units total) into the skin 3 (three) times daily before meals. 02/11/18  Yes Nida, Denman George, MD  lisinopril (PRINIVIL,ZESTRIL) 40 MG tablet Take 40 mg by mouth daily.   Yes [provider]  metFORMIN (GLUCOPHAGE-XR) 500 MG  24 hr tablet Take 500 mg by mouth 2 (two) times daily. 10/01/17  Yes [provider]  nitroGLYCERIN (NITROSTAT) 0.4 MG SL tablet Place 1 tablet (0.4 mg total) under the tongue every 5 (five) minutes x 3 doses as needed for chest pain. 05/12/17  Yes Rodolph Bong, MD  Omega-3 1000 MG CAPS Take 1,000 mg by mouth 2 (two) times daily.   Yes [provider]  Continuous Blood Gluc Sensor (FREESTYLE LIBRE 14 DAY SENSOR) MISC Inject 1 each into the skin every 14 (fourteen) days. Use as directed. 02/11/18   Roma Kayser, MD  Insulin Pen Needle (PEN NEEDLES) 31G X 8 MM MISC 1 each by Does not apply route 4 (four) times daily. 02/19/18   Roma Kayser, MD  methocarbamol (ROBAXIN) 750 MG tablet Take 1 tablet (750 mg total) by mouth 4 (four) times daily as needed (use for muscle cramps/pain). Patient not taking: Reported on 02/27/2018 11/12/17   Karie Soda, MD  traMADol (ULTRAM) 50 MG tablet Take 1-2 tablets (50-100 mg total) by mouth every 6 (six) hours as needed for moderate pain or severe pain. Patient not taking: Reported on 02/27/2018 11/12/17   Karie Soda, MD    Family History Family History  Problem Relation Age of Onset  . Heart disease Mother 33       CABG  . Diabetes Mother   . Gout Mother   . Heart attack Father 5    Social History Social History   Tobacco Use  . Smoking status: Current Some Day Smoker    Packs/day: 0.50    Years: 26.00    Pack years: 13.00    Types: Cigarettes  . Smokeless tobacco: Never Used  Substance Use Topics  . Alcohol use: No  . Drug use: No     Allergies   Bee venom and Penicillins   Review of Systems Review of Systems  All other systems reviewed and are negative.    Physical Exam Updated Vital Signs BP (!) 119/57   Pulse 63   Temp (!) 97.5 F (36.4 C) (Oral)   Resp 17   Ht  (1.803 m)   Wt 120.2 kg (265 lb)   SpO2 97%   BMI 36.96 kg/m   Physical Exam  Constitutional: He appears  well-developed and well-nourished. No distress.  HENT:  Head: Normocephalic and atraumatic.  Mouth/Throat: Oropharynx is clear and moist. No oropharyngeal exudate.  Eyes: Pupils are equal, round, and reactive to light. Conjunctivae and EOM are normal. Right eye exhibits no discharge. Left eye exhibits no discharge. No scleral icterus.  Neck: Normal range of motion. Neck supple. No JVD present. No thyromegaly present.  Cardiovascular: Normal rate, regular rhythm, normal heart sounds and intact distal pulses. Exam reveals no gallop and no friction rub.  No murmur heard. Pulmonary/Chest: Effort normal and breath sounds normal. No respiratory distress. He has no wheezes. He has no rales.  Abdominal: Soft. Bowel sounds are normal. He  exhibits no distension and no mass. There is no tenderness.  Musculoskeletal: Normal range of motion. He exhibits no edema or tenderness.  Lymphadenopathy:    He has no cervical adenopathy.  Neurological: He is alert. Coordination normal.  Skin: Skin is warm and dry. No rash noted. No erythema.  Psychiatric: He has a normal mood and affect. His behavior is normal.  Nursing note and vitals reviewed.    ED Treatments / Results  Labs (all labs ordered are listed, but only abnormal results are displayed) Labs Reviewed  BASIC METABOLIC PANEL - Abnormal; Notable for the following components:      Result Value   Glucose, Bld 142 (*)    All other components within normal limits  CBC  TROPONIN I  PROTIME-INR  TROPONIN I    EKG EKG Interpretation  Date/Time:  Saturday Feb 27 2018 10:35:20 EDT Ventricular Rate:  70 PR Interval:    QRS Duration: 95 QT Interval:  392 QTC Calculation: 423 R Axis:   61 Text Interpretation:  Sinus rhythm Normal ECG since last tracing no significant change Confirmed by Eber Hong ([id number]) on 02/27/2018 11:13:42 AM   EKG Interpretation  Date/Time:  Saturday Feb 27 2018 13:03:16 EDT Ventricular Rate:  62 PR Interval:    QRS  Duration: 98 QT Interval:  406 QTC Calculation: 413 R Axis:   60 Text Interpretation:  Sinus rhythm Normal ECG since last tracing no significant change Confirmed by Eber Hong ([id number]) on 02/27/2018 1:14:41 PM        Radiology Dg Chest 2 View  Result Date: 02/27/2018 CLINICAL DATA:  Shortness of breath, chest pain. EXAM: CHEST - 2 VIEW COMPARISON:  Radiographs of September 04, 2017. FINDINGS: The heart size and mediastinal contours are within normal limits. Both lungs are clear. No pneumothorax or pleural effusion is noted. The visualized skeletal structures are unremarkable. IMPRESSION: No active cardiopulmonary disease. Electronically Signed   By: Lupita Raider, M.D.   On: 02/27/2018 11:33    Procedures Procedures (including critical care time)  Medications Ordered in ED Medications - No data to display   Initial Impression / Assessment and Plan / ED Course  I have reviewed the triage vital signs and the nursing notes.  Pertinent labs & imaging results that were available during my care of the patient were reviewed by me and considered in my medical decision making (see chart for details).  Clinical Course as of Feb 27 1446  Sat Feb 27, 2018  1445 Troponin I [BM]  1445 Troponin I: <0.03 [BM]    Clinical Course User Index [BM] Eber Hong, MD    The patient's exam is unremarkable, he is asymptomatic, his x-ray is pending, his EKG is unchanged and nonischemic, labs are pending.  Will discuss with cardiology but at this time the patient appears asymptomatic.  He is not having exertional symptoms and he states this is not what his symptoms felt like before but he is on anticoagulants and has stents and has increased risk for coronary disease.  Repeat EKG unremarkable CBC and BMP and trop neg  Cardiology paged to discuss  Dr. Rennis Golden has returned the page, he recommends that the patient should get a second troponin, the patient wants to go home and states that he agrees  with the plan if his second troponin is normal.  He has had no recurrent chest pain since arrival.  He has had 2 negative EKGs as well.  Final Clinical Impressions(s) / ED Diagnoses  Final diagnoses:  Chest heaviness    ED Discharge Orders    None       Eber Hong, MD 02/27/18 1447

## 2018-02-27 NOTE — Discharge Instructions (Signed)
Call your doctor for a recheck, you should be seen in the next several days, however if you are developing increasing chest pain difficulty breathing weakness dizziness or any other worsening symptoms, take aspirin nitroglycerin and return to the emergency department immediately. I have discussed your care with the on-call cardiologist and they agree with this plan as well.

## 2018-02-27 NOTE — ED Notes (Signed)
Pt pain free at this time.  Denies any SOB.Marland Kitchen Obtaining IV access.

## 2018-03-02 ENCOUNTER — Telehealth: Payer: Self-pay

## 2018-03-02 NOTE — Telephone Encounter (Signed)
Patient reports using power washer throughout the holiday weekend. C/o pain in right upper chest and bicep down to elbow. Uses repetitive sweeping motion using machine.Rates pain 3/10, denies N/V,SOB. Took 1 NTG with minimal relief.He does have tylenol he can take.He will call back if sx's worsen

## 2018-03-10 DIAGNOSIS — E782 Mixed hyperlipidemia: Secondary | ICD-10-CM | POA: Diagnosis not present

## 2018-03-10 DIAGNOSIS — I10 Essential (primary) hypertension: Secondary | ICD-10-CM | POA: Diagnosis not present

## 2018-04-03 ENCOUNTER — Other Ambulatory Visit: Payer: Self-pay | Admitting: "Endocrinology

## 2018-04-05 NOTE — Progress Notes (Signed)
Cardiology Office Note  Date: 04/06/2018   ID: Lance HaffJustin C Falotico, DOB 01/18/1976, MRN [id number]  PCP: [facility]  Primary Cardiologist: Nona DellSamuel Yukio Bisping, MD   Chief Complaint  Patient presents with  . Coronary Artery Disease    History of Present Illness: Lance [name] is a 42 y.o. male last seen in January.  He is here for a follow-up visit.  He does not report any recurring angina symptoms at this time or nitroglycerin use.  Has been working outdoors in the heat.  He did undergo a laparoscopic appendectomy in February without obvious perioperative cardiac event.  Records indicate ER visit in May due to chest discomfort.  Troponin I levels were negative for ACS at that time.  I reviewed his ECG.  He is following with endocrinology, due for lab work in a few weeks.  We will add a fasting lipid panel.  I am hopeful that we can simplify his lipid regimen somewhat.  Current cardiac regimen includes aspirin, Plavix, Lipitor, Coreg, Zetia, lisinopril, omega-3 supplements, and as needed nitroglycerin.  Past Medical History:  Diagnosis Date  . CAD (coronary artery disease), native coronary artery    10/18 PCI/DES to mRCA, and OM, with total occlusion of dLCx with collaterals.   . Essential hypertension   . GERD (gastroesophageal reflux disease)   . History of kidney stones   . Hyperlipemia   . SVT (supraventricular tachycardia) (HCC) 2015   Converted with adenosine  . Type 2 diabetes mellitus (HCC)     Past Surgical History:  Procedure Laterality Date  . CORONARY ANGIOPLASTY WITH STENT PLACEMENT  07/15/2017   "2 stents"  . LAPAROSCOPIC APPENDECTOMY N/A 11/12/2017   Procedure: APPENDECTOMY LAPAROSCOPIC, REPAIR OF INCARCERATED INCISIONAL HERNIA;  Surgeon: Karie SodaGross, Steven, MD;  Location: [facility] ORS;  Service: General;  Laterality: N/A;  . LAPAROSCOPIC CHOLECYSTECTOMY    . LEFT HEART CATH AND CORONARY ANGIOGRAPHY N/A 07/15/2017   Procedure: LEFT HEART CATH AND CORONARY  ANGIOGRAPHY;  Surgeon: Tonny Bollmanooper, Michael, MD;  [facility] LAB;  Service: Cardiovascular;  Laterality: N/A;    Current Outpatient Medications  Medication Sig Dispense Refill  . ALPRAZolam (XANAX) 1 MG tablet Take 1 mg by mouth daily as needed for anxiety.    Marland Kitchen. aspirin EC 81 MG tablet Take 81 mg by mouth daily.    Marland Kitchen. atorvastatin (LIPITOR) 80 MG tablet Take 1 tablet (80 mg total) by mouth daily. (Patient taking differently: Take 80 mg by mouth at bedtime. ) 30 tablet 0  . carvedilol (COREG) 3.125 MG tablet Take 1 tablet (3.125 mg total) by mouth 2 (two) times daily. 180 tablet 3  . clopidogrel (PLAVIX) 75 MG tablet Take 1 tablet (75 mg total) by mouth daily with breakfast. 90 tablet 2  . Continuous Blood Gluc Sensor (FREESTYLE LIBRE 14 DAY SENSOR) MISC Inject 1 each into the skin every 14 (fourteen) days. Use as directed. 2 each 5  . ezetimibe (ZETIA) 10 MG tablet Take 1 tablet (10 mg total) by mouth daily. 30 tablet 0  . insulin lispro (HUMALOG KWIKPEN) 100 UNIT/ML KiwkPen Inject 0.1-0.16 mLs (10-16 Units total) into the skin 3 (three) times daily before meals. 15 mL 3  . Insulin Pen Needle (PEN NEEDLES) 31G X 8 MM MISC 1 each by Does not apply route 4 (four) times daily. 150 each 5  . LEVEMIR FLEXTOUCH 100 UNIT/ML Pen INJECT 80 UNITS INTO THE SKIN DAILY AT 10 PM. 75 mL 0  . lisinopril (PRINIVIL,ZESTRIL) 40  MG tablet Take 40 mg by mouth daily.    . metFORMIN (GLUCOPHAGE-XR) 500 MG 24 hr tablet Take 500 mg by mouth 2 (two) times daily.    . methocarbamol (ROBAXIN) 750 MG tablet Take 1 tablet (750 mg total) by mouth 4 (four) times daily as needed (use for muscle cramps/pain). 10 tablet 2  . nitroGLYCERIN (NITROSTAT) 0.4 MG SL tablet Place 1 tablet (0.4 mg total) under the tongue every 5 (five) minutes x 3 doses as needed for chest pain. 15 tablet 0  . Omega-3 1000 MG CAPS Take 1,000 mg by mouth 2 (two) times daily.    . pantoprazole (PROTONIX) 20 MG tablet Take 1 tablet (20 mg total) by  mouth daily. 30 tablet 0  . ranitidine (ZANTAC) 150 MG tablet Take 1 tablet (150 mg total) by mouth 2 (two) times daily. 60 tablet 0  . traMADol (ULTRAM) 50 MG tablet Take 1-2 tablets (50-100 mg total) by mouth every 6 (six) hours as needed for moderate pain or severe pain. 30 tablet 0   No current facility-administered medications for this visit.    Allergies:  Bee venom and Penicillins   Social History: The patient  reports that he has been smoking cigarettes.  He has a 13.00 pack-year smoking history. He has never used smokeless tobacco. He reports that he does not drink alcohol or use drugs.   ROS:  Please see the history of present illness. Otherwise, complete review of systems is positive for none.  All other systems are reviewed and negative.   Physical Exam: VS:  BP 122/74   Pulse 84   Ht 5\' 11"  (1.803 m)   Wt 272 lb (123.4 kg)   SpO2 96%   BMI 37.94 kg/m , BMI Body mass index is 37.94 kg/m.  Wt Readings from Last 3 Encounters:  04/06/18 272 lb (123.4 kg)  02/27/18 265 lb (120.2 kg)  02/08/18 269 lb (122 kg)    General: Obese male, appears comfortable at rest. HEENT: Conjunctiva and lids normal, oropharynx clear. Neck: Supple, no elevated JVP or carotid bruits, no thyromegaly. Lungs: Clear to auscultation, nonlabored breathing at rest. Cardiac: Regular rate and rhythm, no S3, soft systolic murmur. Abdomen: Soft, nontender, bowel sounds present. Extremities: No pitting edema, distal pulses 2+. Skin: Warm and dry. Musculoskeletal: No kyphosis. Neuropsychiatric: Alert and oriented x3, affect grossly appropriate.  ECG: I personally reviewed the tracing from 02/27/2018 which showed normal sinus rhythm.  Recent Labwork: 05/10/2017: Magnesium 1.9 10/10/2017: TSH 2.85 01/22/2018: ALT 41; AST 23 02/27/2018: BUN 14; Creatinine, Ser 0.93; Hemoglobin 14.4; Platelets 195; Potassium 3.8; Sodium 137     Component Value Date/Time   CHOL 88 10/10/2017 0929   TRIG 127 10/10/2017 0929     HDL 23 (L) 10/10/2017 0929   CHOLHDL 3.8 10/10/2017 0929   VLDL UNABLE TO CALCULATE IF TRIGLYCERIDE OVER 400 mg/dL 69/62/9528 4132   LDLCALC 44 10/10/2017 0929    Other Studies Reviewed Today:  Cardiac catheterization and PCI 07/15/2017: Conclusion   1. Three vessel CAD  Moderate eccentric proximal LAD stenosis with normal FFR analysis  Severe LCx/OM stenosis treated successfully with a Svelte DES through the Optimize clinical trial  Severe RCA stenosis (hemodynamically significant by FFR) treated successfully with a Svelte DES through the Optimize clinical trial 2. Normal/vigorous LV function with LVEF >65%  DAPT with ASA and plavix x 12 months minimum.   **Pt blinded to stent type**   Echocardiogram 05/11/2017: Study Conclusions  - Left ventricle: The cavity  size was normal. Wall thickness was normal. Systolic function was normal. The estimated ejection fraction was in the range of 55% to 60%. Left ventricular diastolic function parameters were normal. - Aortic valve: Calcified non coronary cusp. Valve area (VTI): 2.28 cm^2. Valve area (Vmax): 2.26 cm^2. Valve area (Vmean): 2.37 cm^2. - Left atrium: The atrium was moderately dilated. - Atrial septum: No defect or patent foramen ovale was identified.  Assessment and Plan:  1.  CAD with history of DES to the circumflex/OM as well as RCA in October of last year.  He is clinically stable at this time.  I reviewed his recent ECG.  He will continue aspirin and Plavix and we will reevaluate this October regarding simplification to aspirin alone.  2.  Mixed hyperlipidemia and severe hypertriglyceridemia.  Follow-up fasting lipid panel.  We may be able to stop Zetia.  3.  Uncontrolled type 2 diabetes mellitus, continues to follow with endocrinology.  4.  Essential hypertension, blood pressure is adequately controlled today.  Current medicines were reviewed with the patient today.   Orders Placed This  Encounter  Procedures  . Lipid Profile    Disposition: Follow-up in October.  Signed, Jonelle Sidle, MD, [facility] 04/06/2018 4:19 PM    [facility] at [facility] [address], [place], [place] [address] Phone: [phone]; Fax: [phone]

## 2018-04-06 ENCOUNTER — Ambulatory Visit (INDEPENDENT_AMBULATORY_CARE_PROVIDER_SITE_OTHER): Payer: 59 | Admitting: Cardiology

## 2018-04-06 ENCOUNTER — Encounter: Payer: Self-pay | Admitting: Cardiology

## 2018-04-06 VITALS — BP 122/74 | HR 84 | Ht 71.0 in | Wt 272.0 lb

## 2018-04-06 DIAGNOSIS — E782 Mixed hyperlipidemia: Secondary | ICD-10-CM | POA: Diagnosis not present

## 2018-04-06 DIAGNOSIS — I25119 Atherosclerotic heart disease of native coronary artery with unspecified angina pectoris: Secondary | ICD-10-CM

## 2018-04-06 DIAGNOSIS — I10 Essential (primary) hypertension: Secondary | ICD-10-CM | POA: Diagnosis not present

## 2018-04-06 DIAGNOSIS — E1165 Type 2 diabetes mellitus with hyperglycemia: Secondary | ICD-10-CM

## 2018-04-06 NOTE — Patient Instructions (Addendum)
Your physician wants you to follow-up in: 3 months with Dr.McDowell You will receive a reminder letter in the mail two months in advance. If you don't receive a letter, please call our office to schedule the follow-up appointment.   Your physician recommends that you return for lab work in: FASTING Lipid     Your physician recommends that you continue on your current medications as directed. Please refer to the Current Medication list given to you today.    If you need a refill on your cardiac medications before your next appointment, please call your pharmacy.      No tests ordered today.      Thank you for choosing [facility] !

## 2018-04-19 DIAGNOSIS — E1165 Type 2 diabetes mellitus with hyperglycemia: Secondary | ICD-10-CM | POA: Diagnosis not present

## 2018-04-19 DIAGNOSIS — I25119 Atherosclerotic heart disease of native coronary artery with unspecified angina pectoris: Secondary | ICD-10-CM | POA: Diagnosis not present

## 2018-04-19 LAB — LIPID PANEL
Cholesterol: 94 mg/dL (ref <200)
HDL: 18 mg/dL — AB (ref >40)
LDL Cholesterol (Calc): 48 mg/dL (calc)
NON-HDL CHOLESTEROL (CALC): 76 mg/dL (ref <130)
TRIGLYCERIDES: 228 mg/dL — AB (ref <150)
Total CHOL/HDL Ratio: 5.2 (calc) — ABNORMAL HIGH (ref <5.0)

## 2018-04-20 ENCOUNTER — Ambulatory Visit (INDEPENDENT_AMBULATORY_CARE_PROVIDER_SITE_OTHER): Payer: 59 | Admitting: "Endocrinology

## 2018-04-20 ENCOUNTER — Encounter: Payer: Self-pay | Admitting: "Endocrinology

## 2018-04-20 VITALS — BP 127/80 | HR 72 | Ht 74.0 in | Wt 274.0 lb

## 2018-04-20 DIAGNOSIS — E782 Mixed hyperlipidemia: Secondary | ICD-10-CM | POA: Diagnosis not present

## 2018-04-20 DIAGNOSIS — I10 Essential (primary) hypertension: Secondary | ICD-10-CM | POA: Diagnosis not present

## 2018-04-20 DIAGNOSIS — E1165 Type 2 diabetes mellitus with hyperglycemia: Secondary | ICD-10-CM | POA: Diagnosis not present

## 2018-04-20 LAB — COMPLETE METABOLIC PANEL WITH GFR
AG Ratio: 2 (calc) (ref 1.0–2.5)
ALBUMIN MSPROF: 4.3 g/dL (ref 3.6–5.1)
ALT: 33 U/L (ref 9–46)
AST: 21 U/L (ref 10–40)
Alkaline phosphatase (APISO): 77 U/L (ref 40–115)
BUN: 16 mg/dL (ref 7–25)
CALCIUM: 9.5 mg/dL (ref 8.6–10.3)
CO2: 27 mmol/L (ref 20–32)
CREATININE: 1 mg/dL (ref 0.60–1.35)
Chloride: 103 mmol/L (ref 98–110)
GFR, EST AFRICAN AMERICAN: 107 mL/min/{1.73_m2} (ref >=60)
GFR, Est Non African American: 92 mL/min/{1.73_m2} (ref >=60)
GLUCOSE: 89 mg/dL (ref 65–99)
Globulin: 2.2 g/dL (calc) (ref 1.9–3.7)
Potassium: 4.2 mmol/L (ref 3.5–5.3)
Sodium: 139 mmol/L (ref 135–146)
TOTAL PROTEIN: 6.5 g/dL (ref 6.1–8.1)
Total Bilirubin: 0.6 mg/dL (ref 0.2–1.2)

## 2018-04-20 LAB — HEMOGLOBIN A1C
EAG (MMOL/L): 10.3 (calc)
HEMOGLOBIN A1C: 8.1 %{Hb} — AB (ref <5.7)
MEAN PLASMA GLUCOSE: 186 (calc)

## 2018-04-20 LAB — MICROALBUMIN / CREATININE URINE RATIO
Creatinine, Urine: 154 mg/dL (ref 20–320)
Microalb Creat Ratio: 5 mcg/mg creat (ref <30)
Microalb, Ur: 0.8 mg/dL

## 2018-04-20 MED ORDER — INSULIN LISPRO 100 UNIT/ML (KWIKPEN): 14.0000 [IU] | PEN_INJECTOR | Freq: Three times a day (TID) | SUBCUTANEOUS | 3 refills | Status: DC

## 2018-04-20 NOTE — Patient Instructions (Signed)

## 2018-04-20 NOTE — Progress Notes (Signed)
Subjective:    Patient ID: Lance HaffJustin C [name], male    DOB: 01/01/1976. Patient is being seen in consultation for management of diabetes requested by  [facility]  Past Medical History:  Diagnosis Date  . CAD (coronary artery disease), native coronary artery    10/18 PCI/DES to mRCA, and OM, with total occlusion of dLCx with collaterals.   . Essential hypertension   . GERD (gastroesophageal reflux disease)   . History of kidney stones   . Hyperlipemia   . SVT (supraventricular tachycardia) (HCC) 2015   Converted with adenosine  . Type 2 diabetes mellitus (HCC)    Past Surgical History:  Procedure Laterality Date  . CORONARY ANGIOPLASTY WITH STENT PLACEMENT  07/15/2017   "2 stents"  . LAPAROSCOPIC APPENDECTOMY N/A 11/12/2017   Procedure: APPENDECTOMY LAPAROSCOPIC, REPAIR OF INCARCERATED INCISIONAL HERNIA;  Surgeon: Karie SodaGross, Steven, MD;  Location: [facility];  Service: General;  Laterality: N/A;  . LAPAROSCOPIC CHOLECYSTECTOMY    . LEFT HEART CATH AND CORONARY ANGIOGRAPHY N/A 07/15/2017   Procedure: LEFT HEART CATH AND CORONARY ANGIOGRAPHY;  Surgeon: Tonny Bollmanooper, Michael, MD;  [facility];  Service: Cardiovascular;  Laterality: N/A;   Social History   Socioeconomic History  . Marital status: Married    Spouse name: Not on file  . Number of children: Not on file  . Years of education: Not on file  . Highest education level: Not on file  Occupational History  . Not on file  Social Needs  . Financial resource strain: Not on file  . Food insecurity:    Worry: Not on file    Inability: Not on file  . Transportation needs:    Medical: Not on file    Non-medical: Not on file  Tobacco Use  . Smoking status: Current Some Day Smoker    Packs/day: 0.50    Years: 26.00    Pack years: 13.00    Types: Cigarettes  . Smokeless tobacco: Never Used  Substance and Sexual Activity  . Alcohol use: No  . Drug use: No  . Sexual activity: Yes    Birth  control/protection: None  Lifestyle  . Physical activity:    Days per week: Not on file    Minutes per session: Not on file  . Stress: Not on file  Relationships  . Social connections:    Talks on phone: Not on file    Gets together: Not on file    Attends religious service: Not on file    Active member of club or organization: Not on file    Attends meetings of clubs or organizations: Not on file    Relationship status: Not on file  Other Topics Concern  . Not on file  Social History Narrative  . Not on file   Outpatient Encounter Medications as of 04/20/2018  Medication Sig  . ALPRAZolam (XANAX) 1 MG tablet Take 1 mg by mouth daily as needed for anxiety.  Marland Kitchen. aspirin EC 81 MG tablet Take 81 mg by mouth daily.  Marland Kitchen. atorvastatin (LIPITOR) 80 MG tablet Take 1 tablet (80 mg total) by mouth daily. (Patient taking differently: Take 80 mg by mouth at bedtime. )  . carvedilol (COREG) 3.125 MG tablet Take 1 tablet (3.125 mg total) by mouth 2 (two) times daily.  . clopidogrel (PLAVIX) 75 MG tablet Take 1 tablet (75 mg total) by mouth daily with breakfast.  . Continuous Blood Gluc Sensor (FREESTYLE LIBRE 14 DAY SENSOR) MISC Inject 1  each into the skin every 14 (fourteen) days. Use as directed.  . ezetimibe (ZETIA) 10 MG tablet Take 1 tablet (10 mg total) by mouth daily.  . insulin lispro (HUMALOG KWIKPEN) 100 UNIT/ML KiwkPen Inject 0.14-0.2 mLs (14-20 Units total) into the skin 3 (three) times daily before meals.  . Insulin Pen Needle (PEN NEEDLES) 31G X 8 MM MISC 1 each by Does not apply route 4 (four) times daily.  Marland Kitchen LEVEMIR FLEXTOUCH 100 UNIT/ML Pen INJECT 80 UNITS INTO THE SKIN DAILY AT 10 PM.  . lisinopril (PRINIVIL,ZESTRIL) 40 MG tablet Take 40 mg by mouth daily.  . metFORMIN (GLUCOPHAGE-XR) 500 MG 24 hr tablet Take 500 mg by mouth 2 (two) times daily.  . methocarbamol (ROBAXIN) 750 MG tablet Take 1 tablet (750 mg total) by mouth 4 (four) times daily as needed (use for muscle cramps/pain).   . nitroGLYCERIN (NITROSTAT) 0.4 MG SL tablet Place 1 tablet (0.4 mg total) under the tongue every 5 (five) minutes x 3 doses as needed for chest pain.  . Omega-3 1000 MG CAPS Take 1,000 mg by mouth 2 (two) times daily.  . pantoprazole (PROTONIX) 20 MG tablet Take 1 tablet (20 mg total) by mouth daily.  . ranitidine (ZANTAC) 150 MG tablet Take 1 tablet (150 mg total) by mouth 2 (two) times daily.  . traMADol (ULTRAM) 50 MG tablet Take 1-2 tablets (50-100 mg total) by mouth every 6 (six) hours as needed for moderate pain or severe pain.  . [DISCONTINUED] insulin lispro (HUMALOG KWIKPEN) 100 UNIT/ML KiwkPen Inject 0.1-0.16 mLs (10-16 Units total) into the skin 3 (three) times daily before meals.   No facility-administered encounter medications on file as of 04/20/2018.    ALLERGIES: Allergies  Allergen Reactions  . Bee Venom Anaphylaxis and Swelling  . Penicillins Hives, Itching and Rash    Has patient had a PCN reaction causing immediate rash, facial/tongue/throat swelling, SOB or lightheadedness with hypotension: No Has patient had a PCN reaction causing severe rash involving mucus membranes or skin necrosis: No Has patient had a PCN reaction that required hospitalization: No Has patient had a PCN reaction occurring within the last 10 years: No If all of the above answers are "NO", then may proceed with Cephalosporin use.    VACCINATION STATUS:  There is no immunization history on file for this patient.  Diabetes  He presents for his follow-up diabetic visit. He has type 2 diabetes mellitus. Onset time: He was diagnosed at approximate age of 42 years. His disease course has been worsening (Was recently hospitalized due to appendicitis after 2 months of antibiotic treatment.  He is status post appendectomy.  ). There are no hypoglycemic associated symptoms. Pertinent negatives for hypoglycemia include no confusion, headaches, pallor or seizures. Associated symptoms include blurred vision,  polydipsia and polyuria. Pertinent negatives for diabetes include no chest pain, no fatigue, no polyphagia and no weakness. There are no hypoglycemic complications. Symptoms are worsening. There are no diabetic complications. Risk factors for coronary artery disease include diabetes mellitus, dyslipidemia, family history, hypertension, male sex, obesity, tobacco exposure and sedentary lifestyle. Current diabetic treatment includes intensive insulin program (He is on Levemir 68 units BID and Humalog 10 units TIDAC.). His weight is increasing steadily. He is following a generally unhealthy diet. When asked about meal planning, he reported none. He has not had a previous visit with a dietitian. He rarely participates in exercise. His home blood glucose trend is decreasing steadily. His breakfast blood glucose range is generally 180-200 mg/dl.  His lunch blood glucose range is generally 140-180 mg/dl. His dinner blood glucose range is generally 140-180 mg/dl. His bedtime blood glucose range is generally 180-200 mg/dl. His overall blood glucose range is 180-200 mg/dl. An ACE inhibitor/angiotensin II receptor blocker is being taken. He does not see a podiatrist.Eye exam is not current.  Hyperlipidemia  This is a chronic problem. The current episode started more than 1 year ago. The problem is controlled. Exacerbating diseases include diabetes and obesity. Pertinent negatives include no chest pain, myalgias or shortness of breath. Current antihyperlipidemic treatment includes statins and bile acid squestrants. The current treatment provides significant improvement of lipids. Risk factors for coronary artery disease include diabetes mellitus, dyslipidemia, hypertension, male sex, obesity and a sedentary lifestyle.  Hypertension  This is a chronic problem. The current episode started more than 1 year ago. Associated symptoms include blurred vision. Pertinent negatives include no chest pain, headaches, neck pain,  palpitations or shortness of breath. Risk factors for coronary artery disease include dyslipidemia, diabetes mellitus, male gender, smoking/tobacco exposure and sedentary lifestyle. Past treatments include ACE inhibitors.     Review of Systems  Constitutional: Negative for chills, fatigue, fever and unexpected weight change.  HENT: Negative for dental problem, mouth sores and trouble swallowing.   Eyes: Positive for blurred vision. Negative for visual disturbance.  Respiratory: Negative for cough, choking, chest tightness, shortness of breath and wheezing.   Cardiovascular: Negative for chest pain, palpitations and leg swelling.  Gastrointestinal: Negative for abdominal distention, abdominal pain, constipation, diarrhea, nausea and vomiting.  Endocrine: Positive for polydipsia and polyuria. Negative for polyphagia.  Genitourinary: Negative for dysuria, flank pain, hematuria and urgency.  Musculoskeletal: Negative for back pain, gait problem, myalgias and neck pain.  Skin: Negative for pallor, rash and wound.  Neurological: Negative for seizures, syncope, weakness, numbness and headaches.  Psychiatric/Behavioral: Negative.  Negative for confusion and dysphoric mood.    Objective:    BP 127/80   Pulse 72   Ht 6\' 2"  (1.88 m)   Wt 274 lb (124.3 kg)   BMI 35.18 kg/m   Wt Readings from Last 3 Encounters:  04/20/18 274 lb (124.3 kg)  04/06/18 272 lb (123.4 kg)  02/27/18 265 lb (120.2 kg)    Physical Exam  Constitutional: He is oriented to person, place, and time. He appears well-developed. He is cooperative. No distress.  HENT:  Head: Normocephalic and atraumatic.  Eyes: EOM are normal.  Neck: Normal range of motion. Neck supple. No tracheal deviation present. No thyromegaly present.  Cardiovascular: Normal rate, S1 normal and S2 normal. Exam reveals no gallop.  No murmur heard. Pulses:      Dorsalis pedis pulses are 1+ on the right side, and 1+ on the left side.       Posterior  tibial pulses are 1+ on the right side, and 1+ on the left side.  Pulmonary/Chest: Effort normal. No respiratory distress. He has no wheezes.  Abdominal: He exhibits no distension. There is no tenderness. There is no guarding and no CVA tenderness.  Musculoskeletal: He exhibits no edema.       Right shoulder: He exhibits no swelling and no deformity.  Neurological: He is alert and oriented to person, place, and time. He has normal strength. No cranial nerve deficit or sensory deficit. Gait normal.  Skin: Skin is warm and dry. No rash noted. No cyanosis. Nails show no clubbing.  Psychiatric: He has a normal mood and affect. His speech is normal. Judgment normal. Cognition and memory  are normal.   CMP ( most recent) CMP     Component Value Date/Time   NA 139 04/19/2018 0804   NA 141 07/07/2017 0837   K 4.2 04/19/2018 0804   CL 103 04/19/2018 0804   CO2 27 04/19/2018 0804   GLUCOSE 89 04/19/2018 0804   BUN 16 04/19/2018 0804   BUN 15 07/07/2017 0837   CREATININE 1.00 04/19/2018 0804   CALCIUM 9.5 04/19/2018 0804   PROT 6.5 04/19/2018 0804   ALBUMIN 4.0 10/05/2017 1515   AST 21 04/19/2018 0804   ALT 33 04/19/2018 0804   ALKPHOS 66 10/05/2017 1515   BILITOT 0.6 04/19/2018 0804   GFRNONAA 92 04/19/2018 0804   GFRAA 107 04/19/2018 0804     Diabetic Labs (most recent): Lab Results  Component Value Date   HGBA1C 8.1 (H) 04/19/2018   HGBA1C 8.0 (H) 01/22/2018   HGBA1C 6.8 (H) 10/10/2017     Lipid Panel ( most recent) Lipid Panel     Component Value Date/Time   CHOL 94 04/19/2018 0808   TRIG 228 (H) 04/19/2018 0808   HDL 18 (L) 04/19/2018 0808   CHOLHDL 5.2 (H) 04/19/2018 0808   VLDL UNABLE TO CALCULATE IF TRIGLYCERIDE OVER 400 mg/dL 40/98/1191 4782   LDLCALC 48 04/19/2018 0808     Assessment & Plan:   1. Uncontrolled type 2 diabetes mellitus with chronic kidney disease, with long-term current use of insulin, unspecified CKD stage (HCC)  - Patient has currently  uncontrolled symptomatic type 2 DM since  42 years of age. - He came with unchanged A1c of 8.1%, average blood glucose of 183 over the last 90 days.    Recent labs reviewed. -his diabetes is complicated by obesity/sedentary life, chronic heavy smoking and JERYN CERNEY remains at a high risk for more acute and chronic complications which include CAD, CVA, CKD, retinopathy, and neuropathy. These are all discussed in detail with the patient.  - I have counseled him on diet management and weight loss, by adopting a carbohydrate restricted/protein rich diet.  -  Suggestion is made for him to avoid simple carbohydrates  from his diet including Cakes, Sweet Desserts / Pastries, Ice Cream, Soda (diet and regular), Sweet Tea, Candies, Chips, Cookies, Store Bought Juices, Alcohol in Excess of  1-2 drinks a day, Artificial Sweeteners, and "Sugar-free" Products. This will help patient to have stable blood glucose profile and potentially avoid unintended weight gain.  - I encouraged him to switch to  unprocessed or minimally processed complex starch and increased protein intake (animal or plant source), fruits, and vegetables.  - he is advised to stick to a routine mealtimes to eat 3 meals  a day and avoid unnecessary snacks ( to snack only to correct hypoglycemia).   - I have approached him with the following individualized plan to manage diabetes and patient agrees:   -His presentation is such that he will continue to require intensive treatment with basal/bolus insulin in order for him to achieve and maintain control of diabetes.  -I advised him to continue Levemir 80 units nightly, increase Humalog to 14-20  units 3 times daily before meals, associated with strict monitoring of blood glucose 4 times a day-before meals and at bedtime and return in 2 weeks for reevaluation.  -Patient is encouraged to call clinic for blood glucose levels less than 70 or above 200 mg /dl. -He did not afford the co-pays for   continuous glucose monitoring.  - He has tolerated metformin ER, I  advised him to lower Metformin ER to 500 mg p.o. twice daily.  -He declined offer for weekly incretin injection therapy.  - Patient specific target  A1c;  LDL, HDL, Triglycerides, and  Waist Circumference were discussed in detail.  2) BP/HTN: His blood pressure is controlled to target.  He will continue his current blood pressure medications including lisinopril 40 mg p.o. daily along with his carvedilol 3.125 mg p.o. twice daily.    3) Lipids: Continues to show significant dyslipidemia.  His triglycerides are increasing to 228 from 127.   Patient is advised to continue statins, Lovaza, and ezetimibe.  4)  Weight/Diet: CDE Consult has been initiated  , exercise, and detailed carbohydrates information provided.  He is an ideal candidate for bariatric surgery.  I briefly discussed this option with him, however he declined this treatment at this time.  5) Chronic Care/Health Maintenance:  -he  is on ACEI/ARB and Statin medications and  is encouraged to continue to follow up with Ophthalmology, Dentist,  Podiatrist at least yearly or according to recommendations, and advised to  quit smoking. I have recommended yearly flu vaccine and pneumonia vaccination at least every 5 years; moderate intensity exercise for up to 150 minutes weekly; and  sleep for at least 7 hours a day.  - I advised patient to maintain close follow up with [facility] for primary care needs.  - Time spent with the patient: 25 min, of which >50% was spent in reviewing his blood glucose logs , discussing his hypo- and hyper-glycemic episodes, reviewing his current and  previous labs and insulin doses and developing a plan to avoid hypo- and hyper-glycemia. Please refer to Patient Instructions for Blood Glucose Monitoring and Insulin/Medications Dosing Guide"  in media tab for additional information. Lance [name] participated in the discussions,  expressed understanding, and voiced agreement with the above plans.  All questions were answered to his satisfaction. he is encouraged to contact clinic should he have any questions or concerns prior to his return visit.  Follow up plan: - Return in about 3 months (around 07/21/2018) for meter, and logs.  Marquis Lunch, MD Phone: [phone]  Fax: [phone]  -  This note was partially dictated with voice recognition software. Similar sounding words can be transcribed inadequately or may not  be corrected upon review.  04/20/2018, 7:05 PM

## 2018-04-21 ENCOUNTER — Telehealth: Payer: Self-pay | Admitting: *Deleted

## 2018-04-21 NOTE — Telephone Encounter (Signed)
-----   Message from Jonelle SidleSamuel G McDowell, MD sent at 04/20/2018  8:46 AM EDT ----- Results reviewed.  Triglycerides 228 on omega-3 supplements, would continue current dose.  LDL 48.  He is on high-dose Lipitor, would go ahead and stop Zetia. A copy of this test should be forwarded to [facility].

## 2018-04-22 ENCOUNTER — Telehealth: Payer: Self-pay | Admitting: "Endocrinology

## 2018-04-22 MED ORDER — INSULIN DETEMIR 100 UNIT/ML FLEXPEN: PEN_INJECTOR | SUBCUTANEOUS | 0 refills | Status: DC

## 2018-04-22 MED ORDER — INSULIN LISPRO 100 UNIT/ML (KWIKPEN): 14.0000 [IU] | PEN_INJECTOR | Freq: Three times a day (TID) | SUBCUTANEOUS | 0 refills | Status: DC

## 2018-04-22 NOTE — Telephone Encounter (Signed)
Lance [name] is asking for refills of insulin lispro (HUMALOG KWIKPEN) 100 UNIT/ML KiwkPen and LEVEMIR FLEXTOUCH 100 UNIT/ML [place] [facility] [place]

## 2018-05-15 ENCOUNTER — Other Ambulatory Visit: Payer: Self-pay | Admitting: "Endocrinology

## 2018-05-20 ENCOUNTER — Ambulatory Visit (INDEPENDENT_AMBULATORY_CARE_PROVIDER_SITE_OTHER): Payer: 59 | Admitting: Endocrinology

## 2018-05-20 ENCOUNTER — Encounter: Payer: Self-pay | Admitting: Endocrinology

## 2018-05-20 VITALS — BP 148/68 | HR 84 | Ht 74.0 in | Wt 274.8 lb

## 2018-05-20 DIAGNOSIS — E1165 Type 2 diabetes mellitus with hyperglycemia: Secondary | ICD-10-CM

## 2018-05-20 MED ORDER — INSULIN DETEMIR 100 UNIT/ML FLEXPEN: 50.0000 [IU] | PEN_INJECTOR | Freq: Every day | SUBCUTANEOUS | 3 refills | Status: DC

## 2018-05-20 MED ORDER — INSULIN LISPRO 100 UNIT/ML (KWIKPEN): 10.0000 [IU] | PEN_INJECTOR | Freq: Three times a day (TID) | SUBCUTANEOUS | 3 refills | Status: DC

## 2018-05-20 NOTE — Progress Notes (Signed)
Subjective:    Patient ID: Lance [name], male    DOB: 1976-02-01, 42 y.o.   MRN: [id number]  HPI  pt is referred by [facility], for diabetes.  Pt states DM was dx'ed in 2014; he has mild if any neuropathy of the lower extremities; he has associated CAD; he has been on insulin since 2016; pt says his diet and exercise are good; he has never had pancreatitis, pancreatic surgery, severe hypoglycemia or DKA, but he had nonketotic hyperosmolar hyperglycemic state in 2015.  He takes metformin, levemir (80 units qhs), and humalog 14 units 3 times a day (just before each meal).  He says cbg's vary from 80-220.  There is no trend throughout the day.   Past Medical History:  Diagnosis Date  . CAD (coronary artery disease), native coronary artery    10/18 PCI/DES to mRCA, and OM, with total occlusion of dLCx with collaterals.   . Essential hypertension   . GERD (gastroesophageal reflux disease)   . History of kidney stones   . Hyperlipemia   . SVT (supraventricular tachycardia) (HCC) 2015   Converted with adenosine  . Type 2 diabetes mellitus (HCC)     Past Surgical History:  Procedure Laterality Date  . CORONARY ANGIOPLASTY WITH STENT PLACEMENT  07/15/2017   "2 stents"  . LAPAROSCOPIC APPENDECTOMY N/A 11/12/2017   Procedure: APPENDECTOMY LAPAROSCOPIC, REPAIR OF INCARCERATED INCISIONAL HERNIA;  Surgeon: Karie Soda, MD;  Location: [facility];  Service: General;  Laterality: N/A;  . LAPAROSCOPIC CHOLECYSTECTOMY    . LEFT HEART CATH AND CORONARY ANGIOGRAPHY N/A 07/15/2017   Procedure: LEFT HEART CATH AND CORONARY ANGIOGRAPHY;  Surgeon: Tonny Bollman, MD;  Location: [facility] INVASIVE CV LAB;  Service: Cardiovascular;  Laterality: N/A;    Social History   Socioeconomic History  . Marital status: Married    Spouse name: Not on file  . Number of children: Not on file  . Years of education: Not on file  . Highest education level: Not on file  Occupational History  . Not on file  Social Needs  .  Financial resource strain: Not on file  . Food insecurity:    Worry: Not on file    Inability: Not on file  . Transportation needs:    Medical: Not on file    Non-medical: Not on file  Tobacco Use  . Smoking status: Current Some Day Smoker    Packs/day: 0.50    Years: 26.00    Pack years: 13.00    Types: Cigarettes  . Smokeless tobacco: Never Used  Substance and Sexual Activity  . Alcohol use: No  . Drug use: No  . Sexual activity: Yes    Birth control/protection: None  Lifestyle  . Physical activity:    Days per week: Not on file    Minutes per session: Not on file  . Stress: Not on file  Relationships  . Social connections:    Talks on phone: Not on file    Gets together: Not on file    Attends religious service: Not on file    Active member of club or organization: Not on file    Attends meetings of clubs or organizations: Not on file    Relationship status: Not on file  . Intimate partner violence:    Fear of current or ex partner: Not on file    Emotionally abused: Not on file    Physically abused: Not on file    Forced sexual activity: Not on file  Other Topics Concern  . Not on file  Social History Narrative  . Not on file    Current Outpatient Medications on File Prior to Visit  Medication Sig Dispense Refill  . ALPRAZolam (XANAX) 1 MG tablet Take 1 mg by mouth daily as needed for anxiety.    Marland Kitchen. aspirin EC 81 MG tablet Take 81 mg by mouth daily.    Marland Kitchen. atorvastatin (LIPITOR) 80 MG tablet Take 1 tablet (80 mg total) by mouth daily. (Patient taking differently: Take 80 mg by mouth at bedtime. ) 30 tablet 0  . clopidogrel (PLAVIX) 75 MG tablet Take 1 tablet (75 mg total) by mouth daily with breakfast. 90 tablet 2  . Continuous Blood Gluc Sensor (FREESTYLE LIBRE 14 DAY SENSOR) MISC Inject 1 each into the skin every 14 (fourteen) days. Use as directed. 2 each 5  . Insulin Pen Needle (PEN NEEDLES) 31G X 8 MM MISC 1 each by Does not apply route 4 (four) times daily.  150 each 5  . lisinopril (PRINIVIL,ZESTRIL) 40 MG tablet Take 40 mg by mouth daily.    . metFORMIN (GLUCOPHAGE-XR) 500 MG 24 hr tablet Take 500 mg by mouth 2 (two) times daily.    . nitroGLYCERIN (NITROSTAT) 0.4 MG SL tablet Place 1 tablet (0.4 mg total) under the tongue every 5 (five) minutes x 3 doses as needed for chest pain. 15 tablet 0  . Omega-3 1000 MG CAPS Take 1,000 mg by mouth 2 (two) times daily.    . pantoprazole (PROTONIX) 20 MG tablet Take 1 tablet (20 mg total) by mouth daily. 30 tablet 0  . ranitidine (ZANTAC) 150 MG tablet Take 1 tablet (150 mg total) by mouth 2 (two) times daily. 60 tablet 0  . traMADol (ULTRAM) 50 MG tablet Take 1-2 tablets (50-100 mg total) by mouth every 6 (six) hours as needed for moderate pain or severe pain. 30 tablet 0  . carvedilol (COREG) 3.125 MG tablet Take 1 tablet (3.125 mg total) by mouth 2 (two) times daily. 180 tablet 3   No current facility-administered medications on file prior to visit.     Allergies  Allergen Reactions  . Bee Venom Anaphylaxis and Swelling  . Penicillins Hives, Itching and Rash    Has patient had a PCN reaction causing immediate rash, facial/tongue/throat swelling, SOB or lightheadedness with hypotension: No Has patient had a PCN reaction causing severe rash involving mucus membranes or skin necrosis: No Has patient had a PCN reaction that required hospitalization: No Has patient had a PCN reaction occurring within the last 10 years: No If all of the above answers are "NO", then may proceed with Cephalosporin use.     Family History  Problem Relation Age of Onset  . Heart disease Mother [id number]       CABG  . Diabetes Mother   . Gout Mother   . Heart attack Father 57    BP (!) 148/68 (BP Location: Left Arm, Patient Position: Sitting, Cuff Size: Normal)   Pulse 84   Ht 6\' 2"  (1.88 m)   Wt 274 lb 12.8 oz (124.6 kg)   SpO2 95%   BMI 35.28 kg/m    Review of Systems denies weight loss, blurry vision, headache,  chest pain, sob, n/v, urinary frequency, muscle cramps, excessive diaphoresis, memory loss, depression, cold intolerance, jypoglycemia, and rhinorrhea.  He has easy bruising.      Objective:   Physical Exam VS: see vs page GEN: no distress HEAD: head: no deformity eyes:  no periorbital swelling, no proptosis external nose and ears are normal mouth: no lesion seen NECK: supple, thyroid is not enlarged CHEST WALL: no deformity LUNGS: clear to auscultation CV: reg rate and rhythm, no murmur ABD: abdomen is soft, nontender.  no hepatosplenomegaly.  not distended.  no hernia MUSCULOSKELETAL: muscle bulk and strength are grossly normal.  no obvious joint swelling.  gait is normal and steady EXTEMITIES: no deformity.  no ulcer on the feet.  feet are of normal color and temp.  1+ bilat leg edema.  There is bilateral onychomycosis of the toenails.   PULSES: dorsalis pedis intact bilat.  no carotid bruit NEURO:  cn 2-12 grossly intact.   readily moves all 4's.  sensation is intact to touch on the feet SKIN:  Normal texture and temperature.  No rash or suspicious lesion is visible.   NODES:  None palpable at the neck PSYCH: alert, well-oriented.  Does not appear anxious nor depressed.    Lab Results  Component Value Date   HGBA1C 8.1 (H) 04/19/2018   Lab Results  Component Value Date   CREATININE 1.00 04/19/2018   BUN 16 04/19/2018   NA 139 04/19/2018   K 4.2 04/19/2018   CL 103 04/19/2018   CO2 27 04/19/2018   I have reviewed outside records, and summarized: Pt was noted to have elevated a1c, and referred here.  Other probs addressed were HTN, dyslipidemia, CAD, and anxiety.      Assessment & Plan:  Insulin-requiring type 2 DM, with CAD: he may be manageable off insulin, so that will be our goal.    Patient Instructions  good diet and exercise significantly improve the control of your diabetes.  please let me know if you wish to be referred to a dietician.  high blood sugar is very  risky to your health.  you should see an eye doctor and dentist every year.  It is very important to get all recommended vaccinations.  Controlling your blood pressure and cholesterol drastically reduces the damage diabetes does to your body.  Those who smoke should quit.  Please discuss these with your doctor.  check your blood sugar twice a day.  vary the time of day when you check, between before the 3 meals, and at bedtime.  also check if you have symptoms of your blood sugar being too high or too low.  please keep a record of the readings and bring it to your next appointment here (or you can bring the meter itself).  You can write it on any piece of paper.  please call us sooner if your blood sugar goes below 70, or if you have a lot of readings over 200.   We will need to take this complex situation in stages For now, please: Start "Ozempic," 0.25 mg weekly Decrease levemir to 50 untis at bedtime, and: Decrease humalog to 10 units 3 times a day (just before each meal) Please call or message us next week, to tell us how the blood sugar is doing.   Our plan will be to increase the Ozempic. Please come back for a follow-up appointment in 2 months.      Bariatric Surgery You have so much to gain by losing weight.  You may have already tried every diet and exercise plan imaginable.  And, you may have sought advice from your family physician, too.   Sometimes, in spite of such diligent efforts, you may not be able to achieve long-term results by yourself.  In cases of severe obesity, bariatric or weight loss surgery is a proven method of achieving long-term weight control.  Our Services Our bariatric surgery programs offer our patients new hope and long-term weight-loss solution.  Since introducing our services in 2003, we have conducted more than 2,400 successful procedures.  Our program is designated as a Investment banker, corporate by the Metabolic and Bariatric Surgery Accreditation and Quality  Improvement Program (MBSAQIP), a Child psychotherapist that sets rigorous patient safety and outcome standards.  Our program is also designated as a Engineer, manufacturing systems by [employer].   Our exceptional weight-loss surgery team specializes in diagnosis, treatment, follow-up care, and ongoing support for our patients with severe weight loss challenges.  We currently offer laparoscopic sleeve gastrectomy, gastric bypass, and adjustable gastric band (LAP-BAND).    Attend our Bariatrics Seminar Choosing to undergo a bariatric procedure is a big decision, and one that should not be taken lightly.  You now have two options in how you learn about weight-loss surgery - in person or online.  Our objective is to ensure you have all of the information that you need to evaluate the advantages and obligations of this life changing procedure.  Please note that you are not alone in this process, and our experienced team is ready to assist and answer all of your questions.  There are several ways to register for a seminar (either on-line or in person): 1)  Call [phone] 2) Go on-line to [facility] and register for either type of seminar.  [email or web address]

## 2018-05-20 NOTE — Patient Instructions (Addendum)
good diet and exercise significantly improve the control of your diabetes.  please let me know if you wish to be referred to a dietician.  high blood sugar is very risky to your health.  you should see an eye doctor and dentist every year.  It is very important to get all recommended vaccinations.  Controlling your blood pressure and cholesterol drastically reduces the damage diabetes does to your body.  Those who smoke should quit.  Please discuss these with your doctor.  check your blood sugar twice a day.  vary the time of day when you check, between before the 3 meals, and at bedtime.  also check if you have symptoms of your blood sugar being too high or too low.  please keep a record of the readings and bring it to your next appointment here (or you can bring the meter itself).  You can write it on any piece of paper.  please call us sooner if your blood sugar goes below 70, or if you have a lot of readings over 200.   We will need to take this complex situation in stages For now, please: Start "Ozempic," 0.25 mg weekly Decrease levemir to 50 untis at bedtime, and: Decrease humalog to 10 units 3 times a day (just before each meal) Please call or message us next week, to tell us how the blood sugar is doing.   Our plan will be to increase the Ozempic. Please come back for a follow-up appointment in 2 months.      Bariatric Surgery You have so much to gain by losing weight.  You may have already tried every diet and exercise plan imaginable.  And, you may have sought advice from your family physician, too.   Sometimes, in spite of such diligent efforts, you may not be able to achieve long-term results by yourself.  In cases of severe obesity, bariatric or weight loss surgery is a proven method of achieving long-term weight control.  Our Services Our bariatric surgery programs offer our patients new hope and long-term weight-loss solution.  Since introducing our services in 2003, we have  conducted more than 2,400 successful procedures.  Our program is designated as a Investment banker, corporateComprehensive Center by the Metabolic and Bariatric Surgery Accreditation and Quality Improvement Program (MBSAQIP), a Child psychotherapistnational accrediting body that sets rigorous patient safety and outcome standards.  Our program is also designated as a Engineer, manufacturing systemsCenter of Excellence by [employer] insurance companies.   Our exceptional weight-loss surgery team specializes in diagnosis, treatment, follow-up care, and ongoing support for our patients with severe weight loss challenges.  We currently offer laparoscopic sleeve gastrectomy, gastric bypass, and adjustable gastric band (LAP-BAND).    Attend our Bariatrics Seminar Choosing to undergo a bariatric procedure is a big decision, and one that should not be taken lightly.  You now have two options in how you learn about weight-loss surgery - in person or online.  Our objective is to ensure you have all of the information that you need to evaluate the advantages and obligations of this life changing procedure.  Please note that you are not alone in this process, and our experienced team is ready to assist and answer all of your questions.  There are several ways to register for a seminar (either on-line or in person): 1)  Call [phone] 2) Go on-line to [facility] and register for either type of seminar.  [email or web address]

## 2018-06-18 ENCOUNTER — Other Ambulatory Visit: Payer: Self-pay | Admitting: Cardiology

## 2018-06-30 ENCOUNTER — Encounter: Payer: Self-pay | Admitting: Cardiology

## 2018-07-06 ENCOUNTER — Telehealth: Payer: Self-pay | Admitting: *Deleted

## 2018-07-06 DIAGNOSIS — Z6839 Body mass index (BMI) 39.0-39.9, adult: Secondary | ICD-10-CM | POA: Diagnosis not present

## 2018-07-06 DIAGNOSIS — M722 Plantar fascial fibromatosis: Secondary | ICD-10-CM | POA: Diagnosis not present

## 2018-07-06 NOTE — Telephone Encounter (Signed)
Left message for patient to call research office to schedule 12 month OPTIMIZE Research study visit.Follow up visit (EKG and Dr. Riley Kill exam). The visit window is 09-sep-19----04-nov-19.

## 2018-07-13 ENCOUNTER — Telehealth: Payer: Self-pay | Admitting: *Deleted

## 2018-07-13 NOTE — Telephone Encounter (Addendum)
Left message on wife's voicemail to call research office to schedule 12 month appointment for the OPTIMIZE research study. Letter mailed today as well.

## 2018-07-20 ENCOUNTER — Encounter: Payer: 59 | Admitting: *Deleted

## 2018-07-20 ENCOUNTER — Ambulatory Visit: Payer: 59 | Admitting: Endocrinology

## 2018-07-20 VITALS — BP 140/80 | HR 62 | Wt 274.0 lb

## 2018-07-20 DIAGNOSIS — Z0289 Encounter for other administrative examinations: Secondary | ICD-10-CM

## 2018-07-20 DIAGNOSIS — Z006 Encounter for examination for normal comparison and control in clinical research program: Secondary | ICD-10-CM

## 2018-07-20 NOTE — Research (Signed)
OPTIMIZE Research study 12 month follow up obtained. Patient denies any changes in mediation. He denies chest pain. EKG obtained.

## 2018-07-22 DIAGNOSIS — E1165 Type 2 diabetes mellitus with hyperglycemia: Secondary | ICD-10-CM | POA: Diagnosis not present

## 2018-07-23 LAB — COMPLETE METABOLIC PANEL WITH GFR
AG Ratio: 2 (calc) (ref 1.0–2.5)
ALKALINE PHOSPHATASE (APISO): 82 U/L (ref 40–115)
ALT: 32 U/L (ref 9–46)
AST: 19 U/L (ref 10–40)
Albumin: 4.4 g/dL (ref 3.6–5.1)
BUN: 13 mg/dL (ref 7–25)
CO2: 29 mmol/L (ref 20–32)
Calcium: 9.6 mg/dL (ref 8.6–10.3)
Chloride: 102 mmol/L (ref 98–110)
Creat: 1 mg/dL (ref 0.60–1.35)
GFR, Est African American: 107 mL/min/{1.73_m2} (ref >=60)
GFR, Est Non African American: 92 mL/min/{1.73_m2} (ref >=60)
GLOBULIN: 2.2 g/dL (ref 1.9–3.7)
Glucose, Bld: 138 mg/dL — ABNORMAL HIGH (ref 65–99)
POTASSIUM: 4.8 mmol/L (ref 3.5–5.3)
SODIUM: 140 mmol/L (ref 135–146)
Total Bilirubin: 0.7 mg/dL (ref 0.2–1.2)
Total Protein: 6.6 g/dL (ref 6.1–8.1)

## 2018-07-23 LAB — HEMOGLOBIN A1C
Hgb A1c MFr Bld: 7.9 % of total Hgb — ABNORMAL HIGH (ref <5.7)
Mean Plasma Glucose: 180 (calc)
eAG (mmol/L): 10 (calc)

## 2018-07-27 ENCOUNTER — Encounter: Payer: Self-pay | Admitting: "Endocrinology

## 2018-07-27 ENCOUNTER — Other Ambulatory Visit: Payer: Self-pay

## 2018-07-27 ENCOUNTER — Ambulatory Visit (INDEPENDENT_AMBULATORY_CARE_PROVIDER_SITE_OTHER): Payer: 59 | Admitting: "Endocrinology

## 2018-07-27 VITALS — BP 137/83 | HR 87 | Ht 74.0 in | Wt 275.0 lb

## 2018-07-27 DIAGNOSIS — I10 Essential (primary) hypertension: Secondary | ICD-10-CM

## 2018-07-27 DIAGNOSIS — E1165 Type 2 diabetes mellitus with hyperglycemia: Secondary | ICD-10-CM | POA: Diagnosis not present

## 2018-07-27 DIAGNOSIS — E782 Mixed hyperlipidemia: Secondary | ICD-10-CM

## 2018-07-27 MED ORDER — INSULIN LISPRO 100 UNIT/ML (KWIKPEN): 14.0000 [IU] | PEN_INJECTOR | Freq: Three times a day (TID) | SUBCUTANEOUS | 3 refills | Status: DC

## 2018-07-27 MED ORDER — INSULIN DETEMIR 100 UNIT/ML FLEXPEN: 80.0000 [IU] | PEN_INJECTOR | Freq: Every day | SUBCUTANEOUS | 3 refills | Status: DC

## 2018-07-27 MED ORDER — PEN NEEDLES 31G X 8 MM MISC: 1.0000 | Freq: Four times a day (QID) | 5 refills | Status: DC

## 2018-07-27 NOTE — Progress Notes (Signed)
Endocrinology follow-up note   Subjective:    Patient ID: Lance [name], male    DOB: 1976-01-03. Patient is being seen in consultation for management of currently uncontrolled symptomatic type 2 diabetes, hyperlipidemia, hypertension.   Past Medical History:  Diagnosis Date  . CAD (coronary artery disease), native coronary artery    10/18 PCI/DES to mRCA, and OM, with total occlusion of dLCx with collaterals.   . Essential hypertension   . GERD (gastroesophageal reflux disease)   . History of kidney stones   . Hyperlipemia   . SVT (supraventricular tachycardia) (HCC) 2015   Converted with adenosine  . Type 2 diabetes mellitus (HCC)    Past Surgical History:  Procedure Laterality Date  . CORONARY ANGIOPLASTY WITH STENT PLACEMENT  07/15/2017   "2 stents"  . LAPAROSCOPIC APPENDECTOMY N/A 11/12/2017   Procedure: APPENDECTOMY LAPAROSCOPIC, REPAIR OF INCARCERATED INCISIONAL HERNIA;  Surgeon: Karie Soda, MD;  Location: [facility];  Service: General;  Laterality: N/A;  . LAPAROSCOPIC CHOLECYSTECTOMY    . LEFT HEART CATH AND CORONARY ANGIOGRAPHY N/A 07/15/2017   Procedure: LEFT HEART CATH AND CORONARY ANGIOGRAPHY;  Surgeon: Tonny Bollman, MD;  Location: [facility] INVASIVE CV LAB;  Service: Cardiovascular;  Laterality: N/A;   Social History   Socioeconomic History  . Marital status: Married    Spouse name: Not on file  . Number of children: Not on file  . Years of education: Not on file  . Highest education level: Not on file  Occupational History  . Not on file  Social Needs  . Financial resource strain: Not on file  . Food insecurity:    Worry: Not on file    Inability: Not on file  . Transportation needs:    Medical: Not on file    Non-medical: Not on file  Tobacco Use  . Smoking status: Current Some Day Smoker    Packs/day: 0.50    Years: 26.00    Pack years: 13.00    Types: Cigarettes  . Smokeless tobacco: Never Used  Substance and Sexual Activity  . Alcohol use: No  .  Drug use: No  . Sexual activity: Yes    Birth control/protection: None  Lifestyle  . Physical activity:    Days per week: Not on file    Minutes per session: Not on file  . Stress: Not on file  Relationships  . Social connections:    Talks on phone: Not on file    Gets together: Not on file    Attends religious service: Not on file    Active member of club or organization: Not on file    Attends meetings of clubs or organizations: Not on file    Relationship status: Not on file  Other Topics Concern  . Not on file  Social History Narrative  . Not on file   Outpatient Encounter Medications as of 07/27/2018  Medication Sig  . ALPRAZolam (XANAX) 1 MG tablet Take 1 mg by mouth daily as needed for anxiety.  Marland Kitchen aspirin EC 81 MG tablet Take 81 mg by mouth daily.  Marland Kitchen atorvastatin (LIPITOR) 80 MG tablet Take 1 tablet (80 mg total) by mouth daily. (Patient taking differently: Take 80 mg by mouth at bedtime. )  . carvedilol (COREG) 3.125 MG tablet Take 1 tablet (3.125 mg total) by mouth 2 (two) times daily.  . clopidogrel (PLAVIX) 75 MG tablet TAKE 1 TABLET BY MOUTH EVERY DAY FOR BREAKFAST  . Continuous Blood Gluc Sensor (FREESTYLE LIBRE 14 DAY  SENSOR) MISC Inject 1 each into the skin every 14 (fourteen) days. Use as directed.  . Insulin Detemir (LEVEMIR FLEXTOUCH) 100 UNIT/ML Pen Inject 80 Units into the skin at bedtime.  . insulin lispro (HUMALOG KWIKPEN) 100 UNIT/ML KiwkPen Inject 0.14-0.2 mLs (14-20 Units total) into the skin 3 (three) times daily with meals.  Marland Kitchen lisinopril (PRINIVIL,ZESTRIL) 40 MG tablet Take 40 mg by mouth daily.  . metFORMIN (GLUCOPHAGE-XR) 500 MG 24 hr tablet Take 500 mg by mouth 2 (two) times daily.  . nitroGLYCERIN (NITROSTAT) 0.4 MG SL tablet Place 1 tablet (0.4 mg total) under the tongue every 5 (five) minutes x 3 doses as needed for chest pain.  . Omega-3 1000 MG CAPS Take 1,000 mg by mouth 2 (two) times daily.  . pantoprazole (PROTONIX) 20 MG tablet Take 1 tablet  (20 mg total) by mouth daily.  . ranitidine (ZANTAC) 150 MG tablet Take 1 tablet (150 mg total) by mouth 2 (two) times daily.  . traMADol (ULTRAM) 50 MG tablet Take 1-2 tablets (50-100 mg total) by mouth every 6 (six) hours as needed for moderate pain or severe pain.  . [DISCONTINUED] Insulin Detemir (LEVEMIR FLEXTOUCH) 100 UNIT/ML Pen Inject 50 Units into the skin at bedtime.  . [DISCONTINUED] insulin lispro (HUMALOG KWIKPEN) 100 UNIT/ML KiwkPen Inject 0.1 mLs (10 Units total) into the skin 3 (three) times daily with meals.  . [DISCONTINUED] Insulin Pen Needle (PEN NEEDLES) 31G X 8 MM MISC 1 each by Does not apply route 4 (four) times daily.   No facility-administered encounter medications on file as of 07/27/2018.    ALLERGIES: Allergies  Allergen Reactions  . Bee Venom Anaphylaxis and Swelling  . Penicillins Hives, Itching and Rash    Has patient had a PCN reaction causing immediate rash, facial/tongue/throat swelling, SOB or lightheadedness with hypotension: No Has patient had a PCN reaction causing severe rash involving mucus membranes or skin necrosis: No Has patient had a PCN reaction that required hospitalization: No Has patient had a PCN reaction occurring within the last 10 years: No If all of the above answers are "NO", then may proceed with Cephalosporin use.    VACCINATION STATUS:  There is no immunization history on file for this patient.  Diabetes  He presents for his follow-up diabetic visit. He has type 2 diabetes mellitus. Onset time: He was diagnosed at approximate age of 42 years. His disease course has been improving (Was recently hospitalized due to appendicitis after 2 months of antibiotic treatment.  He is status post appendectomy.  ). There are no hypoglycemic associated symptoms. Pertinent negatives for hypoglycemia include no confusion, headaches, pallor or seizures. Associated symptoms include blurred vision, polydipsia and polyuria. Pertinent negatives for  diabetes include no chest pain, no fatigue, no polyphagia and no weakness. There are no hypoglycemic complications. Symptoms are improving. There are no diabetic complications. Risk factors for coronary artery disease include diabetes mellitus, dyslipidemia, family history, hypertension, male sex, obesity, tobacco exposure and sedentary lifestyle. Current diabetic treatment includes intensive insulin program (He is on Levemir 68 units BID and Humalog 10 units TIDAC.). His weight is fluctuating minimally. He is following a generally unhealthy diet. When asked about meal planning, he reported none. He has not had a previous visit with a dietitian. He rarely participates in exercise. His home blood glucose trend is decreasing steadily. His breakfast blood glucose range is generally 180-200 mg/dl. His lunch blood glucose range is generally 140-180 mg/dl. His dinner blood glucose range is generally 140-180 mg/dl.  His bedtime blood glucose range is generally 180-200 mg/dl. His overall blood glucose range is 140-180 mg/dl. An ACE inhibitor/angiotensin II receptor blocker is being taken. He does not see a podiatrist.Eye exam is not current.  Hyperlipidemia  This is a chronic problem. The current episode started more than 1 year ago. The problem is controlled. Exacerbating diseases include diabetes and obesity. Pertinent negatives include no chest pain, myalgias or shortness of breath. Current antihyperlipidemic treatment includes statins and bile acid squestrants. The current treatment provides significant improvement of lipids. Risk factors for coronary artery disease include diabetes mellitus, dyslipidemia, hypertension, male sex, obesity and a sedentary lifestyle.  Hypertension  This is a chronic problem. The current episode started more than 1 year ago. Associated symptoms include blurred vision. Pertinent negatives include no chest pain, headaches, neck pain, palpitations or shortness of breath. Risk factors for  coronary artery disease include dyslipidemia, diabetes mellitus, male gender, smoking/tobacco exposure and sedentary lifestyle. Past treatments include ACE inhibitors.     Review of Systems  Constitutional: Negative for chills, fatigue, fever and unexpected weight change.  HENT: Negative for dental problem, mouth sores and trouble swallowing.   Eyes: Positive for blurred vision. Negative for visual disturbance.  Respiratory: Negative for cough, choking, chest tightness, shortness of breath and wheezing.   Cardiovascular: Negative for chest pain, palpitations and leg swelling.  Gastrointestinal: Negative for abdominal distention, abdominal pain, constipation, diarrhea, nausea and vomiting.  Endocrine: Positive for polydipsia and polyuria. Negative for polyphagia.  Genitourinary: Negative for dysuria, flank pain, hematuria and urgency.  Musculoskeletal: Negative for back pain, gait problem, myalgias and neck pain.  Skin: Negative for pallor, rash and wound.  Neurological: Negative for seizures, syncope, weakness, numbness and headaches.  Psychiatric/Behavioral: Negative for confusion and dysphoric mood.    Objective:    BP 137/83   Pulse 87   Wt 275 lb (124.7 kg)   BMI 35.31 kg/m   Wt Readings from Last 3 Encounters:  07/27/18 275 lb (124.7 kg)  07/20/18 274 lb (124.3 kg)  05/20/18 274 lb 12.8 oz (124.6 kg)    Physical Exam  Constitutional: He is oriented to person, place, and time. He appears well-developed. He is cooperative. No distress.  HENT:  Head: Normocephalic and atraumatic.  Eyes: EOM are normal.  Neck: Normal range of motion. Neck supple. No tracheal deviation present. No thyromegaly present.  Cardiovascular: Normal rate, S1 normal and S2 normal. Exam reveals no gallop.  No murmur heard. Pulses:      Dorsalis pedis pulses are 1+ on the right side, and 1+ on the left side.       Posterior tibial pulses are 1+ on the right side, and 1+ on the left side.   Pulmonary/Chest: Effort normal. No respiratory distress. He has no wheezes.  Abdominal: He exhibits no distension. There is no tenderness. There is no guarding and no CVA tenderness.  Musculoskeletal: He exhibits no edema.       Right shoulder: He exhibits no swelling and no deformity.  Neurological: He is alert and oriented to person, place, and time. He has normal strength. No cranial nerve deficit or sensory deficit. Gait normal.  Skin: Skin is warm and dry. No rash noted. No cyanosis. Nails show no clubbing.  Psychiatric: He has a normal mood and affect. His speech is normal. Judgment normal. Cognition and memory are normal.   CMP ( most recent) CMP     Component Value Date/Time   NA 140 07/22/2018 0815   NA  141 07/07/2017 0837   K 4.8 07/22/2018 0815   CL 102 07/22/2018 0815   CO2 29 07/22/2018 0815   GLUCOSE 138 (H) 07/22/2018 0815   BUN 13 07/22/2018 0815   BUN 15 07/07/2017 0837   CREATININE 1.00 07/22/2018 0815   CALCIUM 9.6 07/22/2018 0815   PROT 6.6 07/22/2018 0815   ALBUMIN 4.0 10/05/2017 1515   AST 19 07/22/2018 0815   ALT 32 07/22/2018 0815   ALKPHOS 66 10/05/2017 1515   BILITOT 0.7 07/22/2018 0815   GFRNONAA 92 07/22/2018 0815   GFRAA 107 07/22/2018 0815     Diabetic Labs (most recent): Lab Results  Component Value Date   HGBA1C 7.9 (H) 07/22/2018   HGBA1C 8.1 (H) 04/19/2018   HGBA1C 8.0 (H) 01/22/2018     Lipid Panel ( most recent) Lipid Panel     Component Value Date/Time   CHOL 94 04/19/2018 0808   TRIG 228 (H) 04/19/2018 0808   HDL 18 (L) 04/19/2018 0808   CHOLHDL 5.2 (H) 04/19/2018 0808   VLDL UNABLE TO CALCULATE IF TRIGLYCERIDE OVER 400 mg/dL 40/98/1191 4782   LDLCALC 48 04/19/2018 0808     Assessment & Plan:   1. Uncontrolled type 2 diabetes mellitus with chronic kidney disease, with long-term current use of insulin, unspecified CKD stage (HCC)  - Patient has currently uncontrolled symptomatic type 2 DM since  42 years of  age. -Patient presents with slightly above target glycemic profile with A1c improving to 7.9% from 8.1%.     Recent labs reviewed. -his diabetes is complicated by obesity/sedentary life, chronic heavy smoking and [place]G [place]A remains at a high risk for more acute and chronic complications which include CAD, CVA, CKD, retinopathy, and neuropathy. These are all discussed in detail with the patient.  - I have counseled him on diet management and weight loss, by adopting a carbohydrate restricted/protein rich diet.  -She still admits to dietary indiscretions including consumption of sweets and sweetened beverages.  -  Suggestion is made for him to avoid simple carbohydrates  from his diet including Cakes, Sweet Desserts / Pastries, Ice Cream, Soda (diet and regular), Sweet Tea, Candies, Chips, Cookies, Store Bought Juices, Alcohol in Excess of  1-2 drinks a day, Artificial Sweeteners, and "Sugar-free" Products. This will help patient to have stable blood glucose profile and potentially avoid unintended weight gain.  - I encouraged him to switch to  unprocessed or minimally processed complex starch and increased protein intake (animal or plant source), fruits, and vegetables.  - he is advised to stick to a routine mealtimes to eat 3 meals  a day and avoid unnecessary snacks ( to snack only to correct hypoglycemia).   - I have approached him with the following individualized plan to manage diabetes and patient agrees:   -His presentation is such that he will continue to require intensive treatment with basal/bolus insulin in order for him to achieve and maintain control of diabetes.  -I he is advised to continue Levemir 80 units nightly, continue Humalog  14-20  units 3 times daily before meals, associated with strict monitoring of blood glucose 4 times a day-before meals and at bedtime .  -Patient is encouraged to call clinic for blood glucose levels less than 70 or above 200 mg /dl. -He did not  afford the co-pays for  continuous glucose monitoring.  -He is advised to continue metformin 500 mg ER twice a day after breakfast and supper.    -He declined offer for weekly  incretin injection therapy.  - Patient specific target  A1c;  LDL, HDL, Triglycerides, and  Waist Circumference were discussed in detail.  2) BP/HTN: His blood pressure is controlled to target.  He is advised to continue with his current blood pressure medications including lisinopril 40 mg p.o. daily along with his carvedilol 3.125 mg p.o. twice daily.    3) Lipids: Continues to show significant dyslipidemia.  His triglycerides are increasing to 228 from 127.   Patient is advised to continue statins, Lovaza, and ezetimibe.  4)  Weight/Diet: CDE Consult has been initiated  , exercise, and detailed carbohydrates information provided.  He is an ideal candidate for bariatric surgery.  I briefly discussed this option with him, however he declined this treatment at this time.  5) Chronic Care/Health Maintenance:  -he  is on ACEI/ARB and Statin medications and  is encouraged to continue to follow up with Ophthalmology, Dentist,  Podiatrist at least yearly or according to recommendations, and advised to  quit smoking. I have recommended yearly flu vaccine and pneumonia vaccination at least every 5 years; moderate intensity exercise for up to 150 minutes weekly; and  sleep for at least 7 hours a day.  - I advised patient to maintain close follow up with [facility] for primary care needs.  - Time spent with the patient: 25 min, of which >50% was spent in reviewing his blood glucose logs , discussing his hypo- and hyper-glycemic episodes, reviewing his current and  previous labs and insulin doses and developing a plan to avoid hypo- and hyper-glycemia. Please refer to Patient Instructions for Blood Glucose Monitoring and Insulin/Medications Dosing Guide"  in media tab for additional information. [name]  participated in the discussions, expressed understanding, and voiced agreement with the above plans.  All questions were answered to his satisfaction. he is encouraged to contact clinic should he have any questions or concerns prior to his return visit.   Follow up plan: - Return in about 4 months (around 11/27/2018) for Follow up with Pre-visit Labs, Meter, and Logs.  Marquis Lunch, MD Phone: [phone]  Fax: [phone]  -  This note was partially dictated with voice recognition software. Similar sounding words can be transcribed inadequately or may not  be corrected upon review.  07/27/2018, 4:38 PM

## 2018-07-27 NOTE — Patient Instructions (Signed)

## 2018-07-28 ENCOUNTER — Ambulatory Visit: Payer: 59 | Admitting: Cardiology

## 2018-07-28 ENCOUNTER — Other Ambulatory Visit: Payer: Self-pay

## 2018-07-28 MED ORDER — INSULIN ASPART 100 UNIT/ML FLEXPEN: 14.0000 [IU] | PEN_INJECTOR | Freq: Three times a day (TID) | SUBCUTANEOUS | 2 refills | Status: DC

## 2018-08-26 ENCOUNTER — Encounter: Payer: Self-pay | Admitting: Cardiology

## 2018-08-26 ENCOUNTER — Ambulatory Visit (INDEPENDENT_AMBULATORY_CARE_PROVIDER_SITE_OTHER): Payer: 59 | Admitting: Cardiology

## 2018-08-26 VITALS — BP 138/72 | HR 76 | Ht 71.0 in | Wt 279.0 lb

## 2018-08-26 DIAGNOSIS — E1165 Type 2 diabetes mellitus with hyperglycemia: Secondary | ICD-10-CM | POA: Diagnosis not present

## 2018-08-26 DIAGNOSIS — E782 Mixed hyperlipidemia: Secondary | ICD-10-CM | POA: Diagnosis not present

## 2018-08-26 DIAGNOSIS — I25119 Atherosclerotic heart disease of native coronary artery with unspecified angina pectoris: Secondary | ICD-10-CM

## 2018-08-26 DIAGNOSIS — I10 Essential (primary) hypertension: Secondary | ICD-10-CM

## 2018-08-26 NOTE — Progress Notes (Signed)
Cardiology Office Note  Date: 08/26/2018   ID: Lance HaffJustin C [name], DOB 07/16/1976, MRN [id number]  PCP: [facility]  Primary Cardiologist: Nona DellSamuel McDowell, MD   Chief Complaint  Patient presents with  . Coronary Artery Disease    History of Present Illness: Lance [name] is a 42 y.o. male last seen in July.  He presents for a routine follow-up visit.  Overall doing well, reports no exertional chest pain or unusual shortness of breath with typical activities.  We went over his medications and discussed stopping Plavix at this point.  He will otherwise continue on aspirin, Lipitor, Coreg, lisinopril, omega-3 supplements, and as needed nitroglycerin.  He is due for follow-up lipid panel which will be arranged.  Past Medical History:  Diagnosis Date  . CAD (coronary artery disease), native coronary artery    10/18 PCI/DES to mRCA, and OM, with total occlusion of dLCx with collaterals.   . Essential hypertension   . GERD (gastroesophageal reflux disease)   . History of kidney stones   . Hyperlipemia   . SVT (supraventricular tachycardia) (HCC) 2015   Converted with adenosine  . Type 2 diabetes mellitus (HCC)     Past Surgical History:  Procedure Laterality Date  . CORONARY ANGIOPLASTY WITH STENT PLACEMENT  07/15/2017   "2 stents"  . LAPAROSCOPIC APPENDECTOMY N/A 11/12/2017   Procedure: APPENDECTOMY LAPAROSCOPIC, REPAIR OF INCARCERATED INCISIONAL HERNIA;  Surgeon: Karie SodaGross, Steven, MD;  Location: [facility];  Service: General;  Laterality: N/A;  . LAPAROSCOPIC CHOLECYSTECTOMY    . LEFT HEART CATH AND CORONARY ANGIOGRAPHY N/A 07/15/2017   Procedure: LEFT HEART CATH AND CORONARY ANGIOGRAPHY;  Surgeon: Tonny Bollmanooper, Michael, MD;  [facility];  Service: Cardiovascular;  Laterality: N/A;    Current Outpatient Medications  Medication Sig Dispense Refill  . ALPRAZolam (XANAX) 1 MG tablet Take 1 mg by mouth daily as needed for anxiety.    Marland Kitchen. aspirin EC 81 MG tablet Take  81 mg by mouth daily.    Marland Kitchen. atorvastatin (LIPITOR) 80 MG tablet Take 1 tablet (80 mg total) by mouth daily. (Patient taking differently: Take 80 mg by mouth at bedtime. ) 30 tablet 0  . carvedilol (COREG) 3.125 MG tablet Take 1 tablet (3.125 mg total) by mouth 2 (two) times daily. 180 tablet 3  . Continuous Blood Gluc Sensor (FREESTYLE LIBRE 14 DAY SENSOR) MISC Inject 1 each into the skin every 14 (fourteen) days. Use as directed. 2 each 5  . Insulin Detemir (LEVEMIR FLEXTOUCH) 100 UNIT/ML Pen Inject 80 Units into the skin at bedtime. 30 mL 3  . insulin lispro (HUMALOG KWIKPEN) 100 UNIT/ML KiwkPen Inject 0.14-0.2 mLs (14-20 Units total) into the skin 3 (three) times daily with meals. 30 mL 3  . Insulin Pen Needle (PEN NEEDLES) 31G X 8 MM MISC 1 each by Does not apply route 4 (four) times daily. 150 each 5  . lisinopril (PRINIVIL,ZESTRIL) 40 MG tablet Take 40 mg by mouth daily.    . metFORMIN (GLUCOPHAGE-XR) 500 MG 24 hr tablet Take 500 mg by mouth 2 (two) times daily.    . nitroGLYCERIN (NITROSTAT) 0.4 MG SL tablet Place 1 tablet (0.4 mg total) under the tongue every 5 (five) minutes x 3 doses as needed for chest pain. 15 tablet 0  . Omega-3 1000 MG CAPS Take 1,000 mg by mouth 2 (two) times daily.    . pantoprazole (PROTONIX) 20 MG tablet Take 1 tablet (20 mg total) by mouth daily. 30 tablet  0  . ranitidine (ZANTAC) 150 MG tablet Take 1 tablet (150 mg total) by mouth 2 (two) times daily. 60 tablet 0   No current facility-administered medications for this visit.    Allergies:  Bee venom and Penicillins   Social History: The patient  reports that he has been smoking cigarettes. He has a 13.00 pack-year smoking history. He has never used smokeless tobacco. He reports that he does not drink alcohol or use drugs.   ROS:  Please see the history of present illness. Otherwise, complete review of systems is positive for none.  All other systems are reviewed and negative.   Physical Exam: VS:  BP 138/72    Pulse 76   Ht 5\' 11"  (1.803 m)   Wt 279 lb (126.6 kg)   SpO2 97%   BMI 38.91 kg/m , BMI Body mass index is 38.91 kg/m.  Wt Readings from Last 3 Encounters:  08/26/18 279 lb (126.6 kg)  07/27/18 275 lb (124.7 kg)  07/20/18 274 lb (124.3 kg)    General: Patient appears comfortable at rest. HEENT: Conjunctiva and lids normal, oropharynx clear. Neck: Supple, no elevated JVP or carotid bruits, no thyromegaly. Lungs: Clear to auscultation, nonlabored breathing at rest. Cardiac: Regular rate and rhythm, no S3, soft systolic murmur. Abdomen: Soft, nontender, bowel sounds present. Extremities: No pitting edema, distal pulses 2+. Skin: Warm and dry. Musculoskeletal: No kyphosis. Neuropsychiatric: Alert and oriented x3, affect grossly appropriate.  ECG: I personally reviewed the tracing from 02/27/2018 which showed normal sinus rhythm.  Recent Labwork: 10/10/2017: TSH 2.85 02/27/2018: Hemoglobin 14.4; Platelets 195 07/22/2018: ALT 32; AST 19; BUN 13; Creat 1.00; Potassium 4.8; Sodium 140     Component Value Date/Time   CHOL 94 04/19/2018 0808   TRIG 228 (H) 04/19/2018 0808   HDL 18 (L) 04/19/2018 0808   CHOLHDL 5.2 (H) 04/19/2018 0808   VLDL UNABLE TO CALCULATE IF TRIGLYCERIDE OVER 400 mg/dL 16/07/9603 5409   LDLCALC 48 04/19/2018 8119    Other Studies Reviewed Today:  Cardiac catheterization and PCI 07/15/2017: Conclusion   1. Three vessel CAD  Moderate eccentric proximal LAD stenosis with normal FFR analysis  Severe LCx/OM stenosis treated successfully with a Svelte DES through the Optimize clinical trial  Severe RCA stenosis (hemodynamically significant by FFR) treated successfully with a Svelte DES through the Optimize clinical trial 2. Normal/vigorous LV function with LVEF >65%  DAPT with ASA and plavix x 12 months minimum.   **Pt blinded to stent type**   Echocardiogram 05/11/2017: Study Conclusions  - Left ventricle: The cavity size was normal. Wall thickness  was normal. Systolic function was normal. The estimated ejection fraction was in the range of 55% to 60%. Left ventricular diastolic function parameters were normal. - Aortic valve: Calcified non coronary cusp. Valve area (VTI): 2.28 cm^2. Valve area (Vmax): 2.26 cm^2. Valve area (Vmean): 2.37 cm^2. - Left atrium: The atrium was moderately dilated. - Atrial septum: No defect or patent foramen ovale was identified.  Assessment and Plan:  1.  CAD status post DES to circumflex/OM as well as RCA in October 2018.  He is clinically stable.  At this time we will stop Plavix otherwise continue remaining cardiac regimen.  2.  Mixed hyperlipidemia and history of severe hypertriglyceridemia.  Continue Lipitor and omega-3 supplements, follow-up FLP and LFTs.  3.  Essential hypertension, blood pressure is adequately controlled today.  4.  Type 2 diabetes mellitus, following with [facility].  Current medicines were reviewed with the patient today.  Orders Placed This Encounter  Procedures  . Hepatic function panel  . Lipid panel    Disposition: Follow-up in 6 months.  Signed, Jonelle Sidle, MD, [facility] 08/26/2018 4:29 PM    [facility] [address] [place], [place] [address] Phone: [phone]; Fax: [phone]

## 2018-08-26 NOTE — Patient Instructions (Signed)
Your physician wants you to follow-up in: 6 MONTHS WITH DR MCDOWELL You will receive a reminder letter in the mail two months in advance. If you don't receive a letter, please call our office to schedule the follow-up appointment.  Your physician has recommended you make the following change in your medication:   STOP PLAVIX  Your physician recommends that you return for lab work - L/L'S - PLEASE FAST 6-8 HOURS PRIOR TO LAB WORK   Thank you for choosing [facility]!!

## 2018-09-26 ENCOUNTER — Other Ambulatory Visit: Payer: Self-pay | Admitting: Physician Assistant

## 2018-09-27 NOTE — Telephone Encounter (Signed)
This is a [place] pt.  °

## 2018-10-06 DIAGNOSIS — I219 Acute myocardial infarction, unspecified: Secondary | ICD-10-CM

## 2018-10-06 HISTORY — DX: Acute myocardial infarction, unspecified: I21.9

## 2018-11-03 DIAGNOSIS — Z6841 Body Mass Index (BMI) 40.0 and over, adult: Secondary | ICD-10-CM | POA: Diagnosis not present

## 2018-11-03 DIAGNOSIS — H9201 Otalgia, right ear: Secondary | ICD-10-CM | POA: Diagnosis not present

## 2018-11-03 DIAGNOSIS — M7711 Lateral epicondylitis, right elbow: Secondary | ICD-10-CM | POA: Diagnosis not present

## 2018-11-09 ENCOUNTER — Other Ambulatory Visit: Payer: Self-pay

## 2018-11-09 ENCOUNTER — Telehealth: Payer: Self-pay

## 2018-11-09 DIAGNOSIS — E1165 Type 2 diabetes mellitus with hyperglycemia: Secondary | ICD-10-CM

## 2018-11-09 MED ORDER — INSULIN DETEMIR 100 UNIT/ML FLEXPEN: 80.0000 [IU] | PEN_INJECTOR | Freq: Every day | SUBCUTANEOUS | 3 refills | Status: DC

## 2018-11-09 MED ORDER — INSULIN LISPRO (1 UNIT DIAL) 100 UNIT/ML (KWIKPEN): 14.0000 [IU] | PEN_INJECTOR | Freq: Three times a day (TID) | SUBCUTANEOUS | 3 refills | Status: DC

## 2018-11-09 NOTE — Telephone Encounter (Signed)
LeighAnn Kreg Earhart, CMA  

## 2018-11-10 ENCOUNTER — Other Ambulatory Visit: Payer: Self-pay | Admitting: "Endocrinology

## 2018-11-10 ENCOUNTER — Telehealth: Payer: Self-pay

## 2018-11-10 MED ORDER — INSULIN DEGLUDEC 100 UNIT/ML ~~LOC~~ SOPN
100.0000 [IU] | PEN_INJECTOR | Freq: Every day | SUBCUTANEOUS | 2 refills | Status: DC
Start: 2018-11-10 — End: 2018-11-30

## 2018-11-10 NOTE — Telephone Encounter (Signed)
Lance [name] is stating that his insurance no longer will pay for his [employer] is there an alternative please advise?

## 2018-11-10 NOTE — Telephone Encounter (Signed)
We will try tresiba.

## 2018-11-10 NOTE — Telephone Encounter (Signed)
Patient is aware of recommendation 

## 2018-11-19 DIAGNOSIS — I25119 Atherosclerotic heart disease of native coronary artery with unspecified angina pectoris: Secondary | ICD-10-CM | POA: Diagnosis not present

## 2018-11-19 DIAGNOSIS — E1165 Type 2 diabetes mellitus with hyperglycemia: Secondary | ICD-10-CM | POA: Diagnosis not present

## 2018-11-20 LAB — HEPATIC FUNCTION PANEL
AG RATIO: 1.8 (calc) (ref 1.0–2.5)
ALKALINE PHOSPHATASE (APISO): 82 U/L (ref 36–130)
ALT: 43 U/L (ref 9–46)
AST: 22 U/L (ref 10–40)
Albumin: 4.2 g/dL (ref 3.6–5.1)
BILIRUBIN INDIRECT: 0.6 mg/dL (ref 0.2–1.2)
Bilirubin, Direct: 0.1 mg/dL (ref 0.0–0.2)
Globulin: 2.4 g/dL (calc) (ref 1.9–3.7)
Total Bilirubin: 0.7 mg/dL (ref 0.2–1.2)
Total Protein: 6.6 g/dL (ref 6.1–8.1)

## 2018-11-20 LAB — LIPID PANEL
CHOL/HDL RATIO: 5 (calc) — AB (ref <5.0)
CHOLESTEROL: 114 mg/dL (ref <200)
HDL: 23 mg/dL — AB (ref >=40)
LDL Cholesterol (Calc): 63 mg/dL (calc)
NON-HDL CHOLESTEROL (CALC): 91 mg/dL (ref <130)
Triglycerides: 213 mg/dL — ABNORMAL HIGH (ref <150)

## 2018-11-20 LAB — COMPLETE METABOLIC PANEL WITH GFR
AG Ratio: 1.9 (calc) (ref 1.0–2.5)
ALT: 42 U/L (ref 9–46)
AST: 21 U/L (ref 10–40)
Albumin: 4.3 g/dL (ref 3.6–5.1)
Alkaline phosphatase (APISO): 84 U/L (ref 36–130)
BILIRUBIN TOTAL: 0.8 mg/dL (ref 0.2–1.2)
BUN: 15 mg/dL (ref 7–25)
CO2: 29 mmol/L (ref 20–32)
Calcium: 9.6 mg/dL (ref 8.6–10.3)
Chloride: 100 mmol/L (ref 98–110)
Creat: 1.03 mg/dL (ref 0.60–1.35)
GFR, EST AFRICAN AMERICAN: 103 mL/min/{1.73_m2} (ref >=60)
GFR, Est Non African American: 89 mL/min/{1.73_m2} (ref >=60)
GLUCOSE: 127 mg/dL — AB (ref 65–99)
Globulin: 2.3 g/dL (calc) (ref 1.9–3.7)
POTASSIUM: 5 mmol/L (ref 3.5–5.3)
Sodium: 138 mmol/L (ref 135–146)
TOTAL PROTEIN: 6.6 g/dL (ref 6.1–8.1)

## 2018-11-20 LAB — HEMOGLOBIN A1C
EAG (MMOL/L): 10.6 (calc)
Hgb A1c MFr Bld: 8.3 % of total Hgb — ABNORMAL HIGH (ref <5.7)
MEAN PLASMA GLUCOSE: 192 (calc)

## 2018-11-30 ENCOUNTER — Ambulatory Visit (INDEPENDENT_AMBULATORY_CARE_PROVIDER_SITE_OTHER): Payer: 59 | Admitting: "Endocrinology

## 2018-11-30 ENCOUNTER — Encounter: Payer: Self-pay | Admitting: "Endocrinology

## 2018-11-30 VITALS — BP 134/74 | HR 84 | Ht 71.0 in | Wt 281.0 lb

## 2018-11-30 DIAGNOSIS — I10 Essential (primary) hypertension: Secondary | ICD-10-CM

## 2018-11-30 DIAGNOSIS — E782 Mixed hyperlipidemia: Secondary | ICD-10-CM

## 2018-11-30 DIAGNOSIS — E1165 Type 2 diabetes mellitus with hyperglycemia: Secondary | ICD-10-CM | POA: Diagnosis not present

## 2018-11-30 MED ORDER — INSULIN DEGLUDEC 200 UNIT/ML ~~LOC~~ SOPN: 90.0000 [IU] | PEN_INJECTOR | Freq: Every day | SUBCUTANEOUS | 2 refills | Status: DC

## 2018-11-30 MED ORDER — INSULIN LISPRO 200 UNIT/ML ~~LOC~~ SOPN: 15.0000 [IU] | PEN_INJECTOR | Freq: Three times a day (TID) | SUBCUTANEOUS | 2 refills | Status: DC

## 2018-11-30 NOTE — Progress Notes (Signed)
Endocrinology follow-up note   Subjective:    Patient ID: Lance [name], male    DOB: 12/29/1975. Patient is being seen in consultation for management of currently uncontrolled symptomatic type 2 diabetes, hyperlipidemia, hypertension.   Past Medical History:  Diagnosis Date  . CAD (coronary artery disease), native coronary artery    10/18 PCI/DES to mRCA, and OM, with total occlusion of dLCx with collaterals.   . Essential hypertension   . GERD (gastroesophageal reflux disease)   . History of kidney stones   . Hyperlipemia   . SVT (supraventricular tachycardia) (HCC) 2015   Converted with adenosine  . Type 2 diabetes mellitus (HCC)    Past Surgical History:  Procedure Laterality Date  . CORONARY ANGIOPLASTY WITH STENT PLACEMENT  07/15/2017   "2 stents"  . LAPAROSCOPIC APPENDECTOMY N/A 11/12/2017   Procedure: APPENDECTOMY LAPAROSCOPIC, REPAIR OF INCARCERATED INCISIONAL HERNIA;  Surgeon: Karie Soda, MD;  Location: [facility];  Service: General;  Laterality: N/A;  . LAPAROSCOPIC CHOLECYSTECTOMY    . LEFT HEART CATH AND CORONARY ANGIOGRAPHY N/A 07/15/2017   Procedure: LEFT HEART CATH AND CORONARY ANGIOGRAPHY;  Surgeon: Tonny Bollman, MD;  Location: [facility] INVASIVE CV LAB;  Service: Cardiovascular;  Laterality: N/A;   Social History   Socioeconomic History  . Marital status: Married    Spouse name: Not on file  . Number of children: Not on file  . Years of education: Not on file  . Highest education level: Not on file  Occupational History  . Not on file  Social Needs  . Financial resource strain: Not on file  . Food insecurity:    Worry: Not on file    Inability: Not on file  . Transportation needs:    Medical: Not on file    Non-medical: Not on file  Tobacco Use  . Smoking status: Current Some Day Smoker    Packs/day: 0.50    Years: 26.00    Pack years: 13.00    Types: Cigarettes  . Smokeless tobacco: Never Used  Substance and Sexual Activity  . Alcohol use: No  .  Drug use: No  . Sexual activity: Yes    Birth control/protection: None  Lifestyle  . Physical activity:    Days per week: Not on file    Minutes per session: Not on file  . Stress: Not on file  Relationships  . Social connections:    Talks on phone: Not on file    Gets together: Not on file    Attends religious service: Not on file    Active member of club or organization: Not on file    Attends meetings of clubs or organizations: Not on file    Relationship status: Not on file  Other Topics Concern  . Not on file  Social History Narrative  . Not on file   Outpatient Encounter Medications as of 11/30/2018  Medication Sig  . ALPRAZolam (XANAX) 1 MG tablet Take 1 mg by mouth daily as needed for anxiety.  Marland Kitchen aspirin EC 81 MG tablet Take 81 mg by mouth daily.  Marland Kitchen atorvastatin (LIPITOR) 80 MG tablet Take 1 tablet (80 mg total) by mouth daily. (Patient taking differently: Take 80 mg by mouth at bedtime. )  . carvedilol (COREG) 3.125 MG tablet Take 1 tablet (3.125 mg total) by mouth 2 (two) times daily.  . Insulin Degludec (TRESIBA FLEXTOUCH) 200 UNIT/ML SOPN Inject 90 Units into the skin at bedtime.  . Insulin Lispro (HUMALOG KWIKPEN) 200 UNIT/ML SOPN  Inject 15-21 Units into the skin 3 (three) times daily before meals.  . Insulin Pen Needle (PEN NEEDLES) 31G X 8 MM MISC 1 each by Does not apply route 4 (four) times daily.  Marland Kitchen lisinopril (PRINIVIL,ZESTRIL) 40 MG tablet Take 40 mg by mouth daily.  . metFORMIN (GLUCOPHAGE-XR) 500 MG 24 hr tablet Take 500 mg by mouth 2 (two) times daily.  . nitroGLYCERIN (NITROSTAT) 0.4 MG SL tablet Place 1 tablet (0.4 mg total) under the tongue every 5 (five) minutes x 3 doses as needed for chest pain.  . Omega-3 1000 MG CAPS Take 1,000 mg by mouth 2 (two) times daily.  . [DISCONTINUED] Continuous Blood Gluc Sensor (FREESTYLE LIBRE 14 DAY SENSOR) MISC Inject 1 each into the skin every 14 (fourteen) days. Use as directed.  . [DISCONTINUED] insulin degludec  (TRESIBA FLEXTOUCH) 100 UNIT/ML SOPN FlexTouch Pen Inject 1 mL (100 Units total) into the skin daily.  . [DISCONTINUED] insulin lispro (HUMALOG KWIKPEN) 100 UNIT/ML KiwkPen Inject 0.14-0.2 mLs (14-20 Units total) into the skin 3 (three) times daily with meals.  . [DISCONTINUED] insulin lispro (HUMALOG) 100 UNIT/ML KwikPen Inject 0.14-0.2 mLs (14-20 Units total) into the skin 3 (three) times daily before meals.  . [DISCONTINUED] pantoprazole (PROTONIX) 20 MG tablet Take 1 tablet (20 mg total) by mouth daily.  . [DISCONTINUED] ranitidine (ZANTAC) 150 MG tablet Take 1 tablet (150 mg total) by mouth 2 (two) times daily.   No facility-administered encounter medications on file as of 11/30/2018.    ALLERGIES: Allergies  Allergen Reactions  . Bee Venom Anaphylaxis and Swelling  . Penicillins Hives, Itching and Rash    Has patient had a PCN reaction causing immediate rash, facial/tongue/throat swelling, SOB or lightheadedness with hypotension: No Has patient had a PCN reaction causing severe rash involving mucus membranes or skin necrosis: No Has patient had a PCN reaction that required hospitalization: No Has patient had a PCN reaction occurring within the last 10 years: No If all of the above answers are "NO", then may proceed with Cephalosporin use.    VACCINATION STATUS:  There is no immunization history on file for this patient.  Diabetes  He presents for his follow-up diabetic visit. He has type 2 diabetes mellitus. Onset time: He was diagnosed at approximate age of 35 years. His disease course has been worsening (Was recently hospitalized due to appendicitis after 2 months of antibiotic treatment.  He is status post appendectomy.  ). There are no hypoglycemic associated symptoms. Pertinent negatives for hypoglycemia include no confusion, headaches, pallor or seizures. Pertinent negatives for diabetes include no blurred vision, no chest pain, no fatigue, no polydipsia, no polyphagia, no  polyuria and no weakness. There are no hypoglycemic complications. Symptoms are worsening. There are no diabetic complications. Risk factors for coronary artery disease include diabetes mellitus, dyslipidemia, family history, hypertension, male sex, obesity, tobacco exposure and sedentary lifestyle. Current diabetic treatment includes intensive insulin program (He is on Levemir 68 units BID and Humalog 10 units TIDAC.). His weight is increasing steadily. He is following a generally unhealthy diet. When asked about meal planning, he reported none. He has not had a previous visit with a dietitian. He rarely participates in exercise. His home blood glucose trend is decreasing steadily. His breakfast blood glucose range is generally 180-200 mg/dl. His lunch blood glucose range is generally 180-200 mg/dl. His dinner blood glucose range is generally 180-200 mg/dl. His bedtime blood glucose range is generally 180-200 mg/dl. His overall blood glucose range is 180-200 mg/dl.  An ACE inhibitor/angiotensin II receptor blocker is being taken. He does not see a podiatrist.Eye exam is not current.  Hyperlipidemia  This is a chronic problem. The current episode started more than 1 year ago. The problem is controlled. Exacerbating diseases include diabetes and obesity. Pertinent negatives include no chest pain, myalgias or shortness of breath. Current antihyperlipidemic treatment includes statins and bile acid squestrants. The current treatment provides significant improvement of lipids. Risk factors for coronary artery disease include diabetes mellitus, dyslipidemia, hypertension, male sex, obesity and a sedentary lifestyle.  Hypertension  This is a chronic problem. The current episode started more than 1 year ago. Pertinent negatives include no blurred vision, chest pain, headaches, neck pain, palpitations or shortness of breath. Risk factors for coronary artery disease include dyslipidemia, diabetes mellitus, male gender,  smoking/tobacco exposure and sedentary lifestyle. Past treatments include ACE inhibitors.     Review of Systems  Constitutional: Negative for chills, fatigue, fever and unexpected weight change.  HENT: Negative for dental problem, mouth sores and trouble swallowing.   Eyes: Negative for blurred vision and visual disturbance.  Respiratory: Negative for cough, choking, chest tightness, shortness of breath and wheezing.   Cardiovascular: Negative for chest pain, palpitations and leg swelling.  Gastrointestinal: Negative for abdominal distention, abdominal pain, constipation, diarrhea, nausea and vomiting.  Endocrine: Negative for polydipsia, polyphagia and polyuria.  Genitourinary: Negative for dysuria, flank pain, hematuria and urgency.  Musculoskeletal: Negative for back pain, gait problem, myalgias and neck pain.  Skin: Negative for pallor, rash and wound.  Neurological: Negative for seizures, syncope, weakness, numbness and headaches.  Psychiatric/Behavioral: Negative for confusion and dysphoric mood.    Objective:    BP 134/74   Pulse 84   Ht  (1.803 m)   Wt 281 lb (127.5 kg)   BMI 39.19 kg/m   Wt Readings from Last 3 Encounters:  11/30/18 281 lb (127.5 kg)  08/26/18 279 lb (126.6 kg)  07/27/18 275 lb (124.7 kg)    Physical Exam  Constitutional: He is oriented to person, place, and time. He appears well-developed. He is cooperative. No distress.  HENT:  Head: Normocephalic and atraumatic.  Eyes: EOM are normal.  Neck: Normal range of motion. Neck supple. No tracheal deviation present. No thyromegaly present.  Cardiovascular: Normal rate, S1 normal and S2 normal. Exam reveals no gallop.  No murmur heard. Pulses:      Dorsalis pedis pulses are 1+ on the right side and 1+ on the left side.       Posterior tibial pulses are 1+ on the right side and 1+ on the left side.  Pulmonary/Chest: Effort normal. No respiratory distress. He has no wheezes.  Abdominal: He exhibits  no distension. There is no abdominal tenderness. There is no guarding and no CVA tenderness.  Musculoskeletal:        General: No edema.     Right shoulder: He exhibits no swelling and no deformity.  Neurological: He is alert and oriented to person, place, and time. He has normal strength. No cranial nerve deficit or sensory deficit. Gait normal.  Skin: Skin is warm and dry. No rash noted. No cyanosis. Nails show no clubbing.  Psychiatric: He has a normal mood and affect. His speech is normal. Judgment normal. Cognition and memory are normal.   CMP ( most recent) CMP     Component Value Date/Time   NA 138 11/19/2018 0827   NA 141 07/07/2017 0837   K 5.0 11/19/2018 0827   CL 100  11/19/2018 0827   CO2 29 11/19/2018 0827   GLUCOSE 127 (H) 11/19/2018 0827   BUN 15 11/19/2018 0827   BUN 15 07/07/2017 0837   CREATININE 1.03 11/19/2018 0827   CALCIUM 9.6 11/19/2018 0827   PROT 6.6 11/19/2018 0827   ALBUMIN 4.0 10/05/2017 1515   AST 21 11/19/2018 0827   ALT 42 11/19/2018 0827   ALKPHOS 66 10/05/2017 1515   BILITOT 0.8 11/19/2018 0827   GFRNONAA 89 11/19/2018 0827   GFRAA 103 11/19/2018 0827     Diabetic Labs (most recent): Lab Results  Component Value Date   HGBA1C 8.3 (H) 11/19/2018   HGBA1C 7.9 (H) 07/22/2018   HGBA1C 8.1 (H) 04/19/2018     Lipid Panel ( most recent) Lipid Panel     Component Value Date/Time   CHOL 114 11/19/2018 0822   TRIG 213 (H) 11/19/2018 0822   HDL 23 (L) 11/19/2018 0822   CHOLHDL 5.0 (H) 11/19/2018 0822   VLDL UNABLE TO CALCULATE IF TRIGLYCERIDE OVER 400 mg/dL 59/29/2446 2863   LDLCALC 63 11/19/2018 0822     Assessment & Plan:   1. Uncontrolled type 2 diabetes mellitus with chronic kidney disease, with long-term current use of insulin, unspecified CKD stage (HCC)  - Patient has currently uncontrolled symptomatic type 2 DM since  43 years of age. Gmerek comes with slight increase in his A1c to 8.3% from 7.9%.  His blood glucose readings are  above target both fasting and postprandial.     Recent labs reviewed. -his diabetes is complicated by obesity/sedentary life, chronic heavy smoking and Lance [name] remains at a high risk for more acute and chronic complications which include CAD, CVA, CKD, retinopathy, and neuropathy. These are all discussed in detail with the patient.  - I have counseled him on diet management and weight loss, by adopting a carbohydrate restricted/protein rich diet.  -She still admits to dietary indiscretions including consumption of sweets and sweetened beverages.  - Patient admits there is a room for improvement in his diet and drink choices. -  Suggestion is made for him to avoid simple carbohydrates  from his diet including Cakes, Sweet Desserts / Pastries, Ice Cream, Soda (diet and regular), Sweet Tea, Candies, Chips, Cookies, Store Bought Juices, Alcohol in Excess of  1-2 drinks a day, Artificial Sweeteners, and "Sugar-free" Products. This will help patient to have stable blood glucose profile and potentially avoid unintended weight gain.  - I encouraged him to switch to  unprocessed or minimally processed complex starch and increased protein intake (animal or plant source), fruits, and vegetables.  - he is advised to stick to a routine mealtimes to eat 3 meals  a day and avoid unnecessary snacks ( to snack only to correct hypoglycemia).   - I have approached him with the following individualized plan to manage diabetes and patient agrees:   -His presentation is such that he will continue to require intensive treatment with basal/bolus insulin in order for him to achieve and maintain control of diabetes.  -I discussed and increased his Tresiba to 90 units nightly, continue Humalog 15-21  units 3 times daily before meals, associated with strict monitoring of blood glucose 4 times a day-before meals and at bedtime .  -Patient is encouraged to call clinic for blood glucose levels less than 70 or above 200  mg /dl. -He did not afford the co-pays for  continuous glucose monitoring.  -He is advised to continue metformin 500 mg ER twice a day after breakfast  and supper.    -He declined offer for weekly incretin injection therapy.  - Patient specific target  A1c;  LDL, HDL, Triglycerides, and  Waist Circumference were discussed in detail.  2) BP/HTN: His blood pressure is controlled to target.  He is advised to continue  with his current blood pressure medications including lisinopril 40 mg p.o. daily along with his carvedilol 3.125 mg p.o. twice daily.    3) Lipids: His recent lipid panel showed controlled LDL of 63.  He has benefited from statin therapy.  He is advised to continue omega-3 fatty acids 1000 units p.o. twice daily, as well as Lipitor 80 mg p.o. nightly.     4)  Weight/Diet: CDE Consult has been initiated  , exercise, and detailed carbohydrates information provided.  He is an ideal candidate for bariatric surgery.  He declined bariatric surgery as an option for weight control.    5) Chronic Care/Health Maintenance:  -he  is on ACEI/ARB and Statin medications and  is encouraged to continue to follow up with Ophthalmology, Dentist,  Podiatrist at least yearly or according to recommendations, and advised to  quit smoking. I have recommended yearly flu vaccine and pneumonia vaccination at least every 5 years; moderate intensity exercise for up to 150 minutes weekly; and  sleep for at least 7 hours a day.  - I advised patient to maintain close follow up with Practice, [facility] for primary care needs.  - Time spent with the patient: 25 min, of which >50% was spent in reviewing his blood glucose logs , discussing his hypoglycemia and hyperglycemia episodes, reviewing his current and  previous labs / studies and medications  doses and developing a plan to avoid hypoglycemia and hyperglycemia. Please refer to Patient Instructions for Blood Glucose Monitoring and Insulin/Medications Dosing  Guide"  in media tab for additional information. [name] participated in the discussions, expressed understanding, and voiced agreement with the above plans.  All questions were answered to his satisfaction. he is encouraged to contact clinic should he have any questions or concerns prior to his return visit.  Follow up plan: - Return in about 4 months (around 03/31/2019) for Follow up with Pre-visit Labs, Meter, and Logs.  Marquis Lunch, MD Phone: [phone]  Fax: [phone]  -  This note was partially dictated with voice recognition software. Similar sounding words can be transcribed inadequately or may not  be corrected upon review.  11/30/2018, 5:13 PM

## 2018-11-30 NOTE — Patient Instructions (Signed)

## 2019-01-11 ENCOUNTER — Other Ambulatory Visit: Payer: Self-pay

## 2019-01-11 MED ORDER — PEN NEEDLES 31G X 8 MM MISC: 1.0000 | Freq: Four times a day (QID) | 1 refills | Status: DC

## 2019-01-11 MED ORDER — INSULIN DEGLUDEC 200 UNIT/ML ~~LOC~~ SOPN: 90.0000 [IU] | PEN_INJECTOR | Freq: Every day | SUBCUTANEOUS | 1 refills | Status: DC

## 2019-01-11 MED ORDER — INSULIN LISPRO 200 UNIT/ML ~~LOC~~ SOPN: 15.0000 [IU] | PEN_INJECTOR | Freq: Three times a day (TID) | SUBCUTANEOUS | 1 refills | Status: DC

## 2019-01-11 MED ORDER — METFORMIN HCL ER 500 MG PO TB24: 500.0000 mg | ORAL_TABLET | Freq: Two times a day (BID) | ORAL | 1 refills | Status: DC

## 2019-02-13 ENCOUNTER — Other Ambulatory Visit: Payer: Self-pay | Admitting: "Endocrinology

## 2019-02-14 ENCOUNTER — Other Ambulatory Visit: Payer: Self-pay

## 2019-02-14 MED ORDER — INSULIN LISPRO 200 UNIT/ML ~~LOC~~ SOPN: 15.0000 [IU] | PEN_INJECTOR | Freq: Three times a day (TID) | SUBCUTANEOUS | 2 refills | Status: DC

## 2019-02-23 ENCOUNTER — Ambulatory Visit: Payer: 59 | Admitting: Cardiology

## 2019-03-12 ENCOUNTER — Encounter (HOSPITAL_COMMUNITY): Payer: Self-pay | Admitting: Emergency Medicine

## 2019-03-12 ENCOUNTER — Encounter (HOSPITAL_COMMUNITY)
Admission: EM | Disposition: A | Discharge status: Home / Self Care | Payer: Self-pay | Source: Home / Self Care | Attending: Internal Medicine

## 2019-03-12 ENCOUNTER — Other Ambulatory Visit: Payer: Self-pay

## 2019-03-12 ENCOUNTER — Emergency Department (HOSPITAL_COMMUNITY): Payer: 59

## 2019-03-12 ENCOUNTER — Inpatient Hospital Stay (HOSPITAL_COMMUNITY)
Admission: EM | Admit: 2019-03-12 | Discharge: 2019-03-16 | DRG: 246 | Disposition: A | Discharge status: Home / Self Care | Payer: 59 | Attending: Internal Medicine | Admitting: Internal Medicine

## 2019-03-12 DIAGNOSIS — Z8249 Family history of ischemic heart disease and other diseases of the circulatory system: Secondary | ICD-10-CM | POA: Diagnosis not present

## 2019-03-12 DIAGNOSIS — E785 Hyperlipidemia, unspecified: Secondary | ICD-10-CM

## 2019-03-12 DIAGNOSIS — Z7982 Long term (current) use of aspirin: Secondary | ICD-10-CM

## 2019-03-12 DIAGNOSIS — E669 Obesity, unspecified: Secondary | ICD-10-CM | POA: Diagnosis present

## 2019-03-12 DIAGNOSIS — Z720 Tobacco use: Secondary | ICD-10-CM | POA: Diagnosis not present

## 2019-03-12 DIAGNOSIS — Z955 Presence of coronary angioplasty implant and graft: Secondary | ICD-10-CM

## 2019-03-12 DIAGNOSIS — F17210 Nicotine dependence, cigarettes, uncomplicated: Secondary | ICD-10-CM | POA: Diagnosis present

## 2019-03-12 DIAGNOSIS — I255 Ischemic cardiomyopathy: Secondary | ICD-10-CM | POA: Diagnosis present

## 2019-03-12 DIAGNOSIS — I10 Essential (primary) hypertension: Secondary | ICD-10-CM | POA: Diagnosis present

## 2019-03-12 DIAGNOSIS — I2101 ST elevation (STEMI) myocardial infarction involving left main coronary artery: Secondary | ICD-10-CM | POA: Diagnosis present

## 2019-03-12 DIAGNOSIS — I462 Cardiac arrest due to underlying cardiac condition: Secondary | ICD-10-CM | POA: Diagnosis present

## 2019-03-12 DIAGNOSIS — Z833 Family history of diabetes mellitus: Secondary | ICD-10-CM

## 2019-03-12 DIAGNOSIS — I472 Ventricular tachycardia: Secondary | ICD-10-CM | POA: Diagnosis present

## 2019-03-12 DIAGNOSIS — I2102 ST elevation (STEMI) myocardial infarction involving left anterior descending coronary artery: Secondary | ICD-10-CM | POA: Diagnosis present

## 2019-03-12 DIAGNOSIS — Z794 Long term (current) use of insulin: Secondary | ICD-10-CM | POA: Diagnosis not present

## 2019-03-12 DIAGNOSIS — I2510 Atherosclerotic heart disease of native coronary artery without angina pectoris: Secondary | ICD-10-CM | POA: Diagnosis present

## 2019-03-12 DIAGNOSIS — E118 Type 2 diabetes mellitus with unspecified complications: Secondary | ICD-10-CM

## 2019-03-12 DIAGNOSIS — F17200 Nicotine dependence, unspecified, uncomplicated: Secondary | ICD-10-CM | POA: Diagnosis present

## 2019-03-12 DIAGNOSIS — I252 Old myocardial infarction: Secondary | ICD-10-CM

## 2019-03-12 DIAGNOSIS — T82855A Stenosis of coronary artery stent, initial encounter: Secondary | ICD-10-CM | POA: Diagnosis present

## 2019-03-12 DIAGNOSIS — Z1159 Encounter for screening for other viral diseases: Secondary | ICD-10-CM

## 2019-03-12 DIAGNOSIS — R0683 Snoring: Secondary | ICD-10-CM

## 2019-03-12 DIAGNOSIS — E119 Type 2 diabetes mellitus without complications: Secondary | ICD-10-CM | POA: Diagnosis present

## 2019-03-12 DIAGNOSIS — I213 ST elevation (STEMI) myocardial infarction of unspecified site: Secondary | ICD-10-CM | POA: Diagnosis present

## 2019-03-12 DIAGNOSIS — E782 Mixed hyperlipidemia: Secondary | ICD-10-CM | POA: Diagnosis present

## 2019-03-12 DIAGNOSIS — Y831 Surgical operation with implant of artificial internal device as the cause of abnormal reaction of the patient, or of later complication, without mention of misadventure at the time of the procedure: Secondary | ICD-10-CM | POA: Diagnosis present

## 2019-03-12 DIAGNOSIS — I4901 Ventricular fibrillation: Secondary | ICD-10-CM

## 2019-03-12 DIAGNOSIS — E876 Hypokalemia: Secondary | ICD-10-CM | POA: Diagnosis present

## 2019-03-12 HISTORY — PX: LEFT HEART CATH AND CORONARY ANGIOGRAPHY: CATH118249

## 2019-03-12 HISTORY — PX: CORONARY/GRAFT ACUTE MI REVASCULARIZATION: CATH118305

## 2019-03-12 LAB — BASIC METABOLIC PANEL
Anion gap: 12 (ref 5–15)
BUN: 15 mg/dL (ref 6–20)
CO2: 24 mmol/L (ref 22–32)
Calcium: 9.9 mg/dL (ref 8.9–10.3)
Chloride: 105 mmol/L (ref 98–111)
Creatinine, Ser: 1.25 mg/dL — ABNORMAL HIGH (ref 0.61–1.24)
GFR calc Af Amer: >60 mL/min (ref >60)
GFR calc non Af Amer: >60 mL/min (ref >60)
Glucose, Bld: 133 mg/dL — ABNORMAL HIGH (ref 70–99)
Potassium: 3.4 mmol/L — ABNORMAL LOW (ref 3.5–5.1)
Sodium: 141 mmol/L (ref 135–145)

## 2019-03-12 LAB — SARS CORONAVIRUS 2 BY RT PCR (HOSPITAL ORDER, PERFORMED IN ~~LOC~~ HOSPITAL LAB): SARS Coronavirus 2: NEGATIVE

## 2019-03-12 LAB — CBC
HCT: 46.3 % (ref 39.0–52.0)
Hemoglobin: 15.5 g/dL (ref 13.0–17.0)
MCH: 29.5 pg (ref 26.0–34.0)
MCHC: 33.5 g/dL (ref 30.0–36.0)
MCV: 88 fL (ref 80.0–100.0)
Platelets: 303 10*3/uL (ref 150–400)
RBC: 5.26 MIL/uL (ref 4.22–5.81)
RDW: 12.9 % (ref 11.5–15.5)
WBC: 22.8 10*3/uL — ABNORMAL HIGH (ref 4.0–10.5)
nRBC: 0 % (ref 0.0–0.2)

## 2019-03-12 LAB — TROPONIN I: Troponin I: <0.03 ng/mL (ref <0.03)

## 2019-03-12 LAB — I-STAT TROPONIN, ED: Troponin i, poc: 0.01 ng/mL (ref 0.00–0.08)

## 2019-03-12 SURGERY — CORONARY/GRAFT ACUTE MI REVASCULARIZATION
Anesthesia: LOCAL

## 2019-03-12 MED ORDER — NITROGLYCERIN 0.4 MG SL SUBL: 0.4000 mg | SUBLINGUAL_TABLET | SUBLINGUAL | Status: DC | PRN

## 2019-03-12 MED ORDER — HEPARIN (PORCINE) IN NACL 1000-0.9 UT/500ML-% IV SOLN
INTRAVENOUS | Status: DC | PRN
  Administered 2019-03-12 (×2): 500 mL

## 2019-03-12 MED ORDER — SODIUM CHLORIDE 0.9% FLUSH
3.0000 mL | Freq: Two times a day (BID) | INTRAVENOUS | Status: DC
  Administered 2019-03-13 – 2019-03-15 (×5): 3 mL via INTRAVENOUS

## 2019-03-12 MED ORDER — TIROFIBAN HCL IN NACL 5-0.9 MG/100ML-% IV SOLN
INTRAVENOUS | Status: AC
  Filled 2019-03-12: qty 100

## 2019-03-12 MED ORDER — HEPARIN SODIUM (PORCINE) 1000 UNIT/ML IJ SOLN
INTRAMUSCULAR | Status: AC
  Filled 2019-03-12: qty 1

## 2019-03-12 MED ORDER — HEPARIN (PORCINE) 25000 UT/250ML-% IV SOLN
INTRAVENOUS | Status: AC
  Filled 2019-03-12: qty 250

## 2019-03-12 MED ORDER — TICAGRELOR 90 MG PO TABS
90.0000 mg | ORAL_TABLET | Freq: Two times a day (BID) | ORAL | Status: DC
  Administered 2019-03-13 – 2019-03-16 (×7): 90 mg via ORAL
  Filled 2019-03-12 (×7): qty 1

## 2019-03-12 MED ORDER — FENTANYL CITRATE (PF) 100 MCG/2ML IJ SOLN
INTRAMUSCULAR | Status: AC
  Filled 2019-03-12: qty 2

## 2019-03-12 MED ORDER — TIROFIBAN HCL IN NACL 5-0.9 MG/100ML-% IV SOLN
0.1500 ug/kg/min | INTRAVENOUS | Status: AC
  Administered 2019-03-13 (×3): 0.15 ug/kg/min via INTRAVENOUS
  Filled 2019-03-12 (×3): qty 100

## 2019-03-12 MED ORDER — ATORVASTATIN CALCIUM 80 MG PO TABS
80.0000 mg | ORAL_TABLET | Freq: Every day | ORAL | Status: DC
  Administered 2019-03-13 – 2019-03-15 (×4): 80 mg via ORAL
  Filled 2019-03-12 (×4): qty 1

## 2019-03-12 MED ORDER — ACETAMINOPHEN 325 MG PO TABS
650.0000 mg | ORAL_TABLET | ORAL | Status: DC | PRN
  Administered 2019-03-13: 650 mg via ORAL
  Filled 2019-03-12: qty 2

## 2019-03-12 MED ORDER — POTASSIUM CHLORIDE CRYS ER 20 MEQ PO TBCR
40.0000 meq | EXTENDED_RELEASE_TABLET | Freq: Once | ORAL | Status: AC
  Administered 2019-03-12: 40 meq via ORAL
  Filled 2019-03-12: qty 2

## 2019-03-12 MED ORDER — AMIODARONE IV BOLUS ONLY 150 MG/100ML
150.0000 mg | Freq: Once | INTRAVENOUS | Status: AC
  Administered 2019-03-12: 150 mg via INTRAVENOUS
  Filled 2019-03-12: qty 100

## 2019-03-12 MED ORDER — SODIUM CHLORIDE 0.9 % IV SOLN
INTRAVENOUS | Status: AC | PRN
  Administered 2019-03-12: 100 mL/h via INTRAVENOUS

## 2019-03-12 MED ORDER — TICAGRELOR 90 MG PO TABS
ORAL_TABLET | ORAL | Status: AC
  Filled 2019-03-12: qty 1

## 2019-03-12 MED ORDER — LIDOCAINE HCL (PF) 1 % IJ SOLN
INTRAMUSCULAR | Status: AC
  Filled 2019-03-12: qty 30

## 2019-03-12 MED ORDER — ONDANSETRON HCL 4 MG/2ML IJ SOLN: 4.0000 mg | Freq: Four times a day (QID) | INTRAMUSCULAR | Status: DC | PRN

## 2019-03-12 MED ORDER — SODIUM CHLORIDE 0.9% FLUSH: 3.0000 mL | INTRAVENOUS | Status: DC | PRN

## 2019-03-12 MED ORDER — SODIUM CHLORIDE 0.9% FLUSH: 3.0000 mL | Freq: Once | INTRAVENOUS | Status: DC

## 2019-03-12 MED ORDER — TIROFIBAN (AGGRASTAT) BOLUS VIA INFUSION
INTRAVENOUS | Status: DC | PRN
  Administered 2019-03-12: 21:00:00 3062.5 ug via INTRAVENOUS

## 2019-03-12 MED ORDER — CARVEDILOL 3.125 MG PO TABS
3.1250 mg | ORAL_TABLET | Freq: Two times a day (BID) | ORAL | Status: DC
  Administered 2019-03-13 (×2): 3.125 mg via ORAL
  Filled 2019-03-12 (×2): qty 1

## 2019-03-12 MED ORDER — ALPRAZOLAM 0.5 MG PO TABS
1.0000 mg | ORAL_TABLET | Freq: Every day | ORAL | Status: DC | PRN
  Administered 2019-03-13: 1 mg via ORAL
  Filled 2019-03-12 (×2): qty 2

## 2019-03-12 MED ORDER — TIROFIBAN HCL IN NACL 5-0.9 MG/100ML-% IV SOLN
INTRAVENOUS | Status: AC | PRN
  Administered 2019-03-12 (×2): 0.15 ug/kg/min via INTRAVENOUS

## 2019-03-12 MED ORDER — HEPARIN (PORCINE) IN NACL 1000-0.9 UT/500ML-% IV SOLN
INTRAVENOUS | Status: AC
  Filled 2019-03-12: qty 500

## 2019-03-12 MED ORDER — HEPARIN BOLUS VIA INFUSION
4000.0000 [IU] | Freq: Once | INTRAVENOUS | Status: AC
  Administered 2019-03-12: 4000 [IU] via INTRAVENOUS

## 2019-03-12 MED ORDER — IOHEXOL 350 MG/ML SOLN
INTRAVENOUS | Status: DC | PRN
  Administered 2019-03-12: 260 mL via INTRA_ARTERIAL

## 2019-03-12 MED ORDER — LIDOCAINE HCL (PF) 1 % IJ SOLN
INTRAMUSCULAR | Status: DC | PRN
  Administered 2019-03-12: 2 mL

## 2019-03-12 MED ORDER — FUROSEMIDE 10 MG/ML IJ SOLN
20.0000 mg | Freq: Once | INTRAMUSCULAR | Status: AC
  Administered 2019-03-12: 20 mg via INTRAVENOUS
  Filled 2019-03-12: qty 2

## 2019-03-12 MED ORDER — TICAGRELOR 90 MG PO TABS
ORAL_TABLET | ORAL | Status: DC | PRN
  Administered 2019-03-12: 180 mg via ORAL

## 2019-03-12 MED ORDER — SODIUM CHLORIDE 0.9 % IV SOLN: 250.0000 mL | INTRAVENOUS | Status: DC | PRN

## 2019-03-12 MED ORDER — NITROGLYCERIN IN D5W 200-5 MCG/ML-% IV SOLN: 2.0000 ug/min | INTRAVENOUS | Status: DC

## 2019-03-12 MED ORDER — HEPARIN SODIUM (PORCINE) 1000 UNIT/ML IJ SOLN
INTRAMUSCULAR | Status: DC | PRN
  Administered 2019-03-12 (×2): 5000 [IU] via INTRAVENOUS

## 2019-03-12 MED ORDER — INSULIN ASPART 100 UNIT/ML ~~LOC~~ SOLN
0.0000 [IU] | Freq: Three times a day (TID) | SUBCUTANEOUS | Status: DC
  Administered 2019-03-13: 8 [IU] via SUBCUTANEOUS
  Administered 2019-03-13: 2 [IU] via SUBCUTANEOUS
  Administered 2019-03-13 – 2019-03-14 (×2): 3 [IU] via SUBCUTANEOUS
  Administered 2019-03-14 – 2019-03-15 (×4): 2 [IU] via SUBCUTANEOUS

## 2019-03-12 MED ORDER — MIDAZOLAM HCL 2 MG/2ML IJ SOLN
INTRAMUSCULAR | Status: AC
  Filled 2019-03-12: qty 2

## 2019-03-12 MED ORDER — NITROGLYCERIN 1 MG/10 ML FOR IR/CATH LAB
INTRA_ARTERIAL | Status: DC | PRN
  Administered 2019-03-12 (×2): 200 ug via INTRACORONARY

## 2019-03-12 MED ORDER — VERAPAMIL HCL 2.5 MG/ML IV SOLN
INTRAVENOUS | Status: DC | PRN
  Administered 2019-03-12: 10 mL via INTRA_ARTERIAL

## 2019-03-12 MED ORDER — OMEGA-3-ACID ETHYL ESTERS 1 G PO CAPS
1000.0000 mg | ORAL_CAPSULE | Freq: Two times a day (BID) | ORAL | Status: DC
  Administered 2019-03-13 – 2019-03-16 (×7): 1000 mg via ORAL
  Filled 2019-03-12 (×8): qty 1

## 2019-03-12 MED ORDER — NITROGLYCERIN IN D5W 200-5 MCG/ML-% IV SOLN
INTRAVENOUS | Status: AC
  Filled 2019-03-12: qty 250

## 2019-03-12 MED ORDER — FENTANYL CITRATE (PF) 100 MCG/2ML IJ SOLN
INTRAMUSCULAR | Status: DC | PRN
  Administered 2019-03-12 (×4): 25 ug via INTRAVENOUS

## 2019-03-12 MED ORDER — MORPHINE SULFATE (PF) 4 MG/ML IV SOLN
INTRAVENOUS | Status: AC
  Administered 2019-03-12: 4 mg
  Filled 2019-03-12: qty 1

## 2019-03-12 MED ORDER — NITROGLYCERIN 1 MG/10 ML FOR IR/CATH LAB
INTRA_ARTERIAL | Status: AC
  Filled 2019-03-12: qty 10

## 2019-03-12 MED ORDER — INSULIN GLARGINE 100 UNIT/ML ~~LOC~~ SOLN
90.0000 [IU] | Freq: Every day | SUBCUTANEOUS | Status: DC
  Administered 2019-03-13 – 2019-03-15 (×4): 90 [IU] via SUBCUTANEOUS
  Filled 2019-03-12 (×6): qty 0.9

## 2019-03-12 MED ORDER — NITROGLYCERIN IN D5W 200-5 MCG/ML-% IV SOLN
INTRAVENOUS | Status: AC | PRN
  Administered 2019-03-12: 10 ug/min via INTRAVENOUS

## 2019-03-12 MED ORDER — VERAPAMIL HCL 2.5 MG/ML IV SOLN
INTRAVENOUS | Status: AC
  Filled 2019-03-12: qty 2

## 2019-03-12 MED ORDER — MIDAZOLAM HCL 2 MG/2ML IJ SOLN
INTRAMUSCULAR | Status: DC | PRN
  Administered 2019-03-12 (×3): 1 mg via INTRAVENOUS

## 2019-03-12 MED ORDER — ASPIRIN EC 81 MG PO TBEC
81.0000 mg | DELAYED_RELEASE_TABLET | Freq: Every day | ORAL | Status: DC
  Administered 2019-03-13 – 2019-03-16 (×4): 81 mg via ORAL
  Filled 2019-03-12 (×5): qty 1

## 2019-03-12 MED ORDER — ENOXAPARIN SODIUM 40 MG/0.4ML ~~LOC~~ SOLN
40.0000 mg | SUBCUTANEOUS | Status: DC
  Administered 2019-03-13 – 2019-03-15 (×3): 40 mg via SUBCUTANEOUS
  Filled 2019-03-12 (×4): qty 0.4

## 2019-03-12 MED ORDER — LABETALOL HCL 5 MG/ML IV SOLN: 10.0000 mg | INTRAVENOUS | Status: AC | PRN

## 2019-03-12 MED ORDER — HYDRALAZINE HCL 20 MG/ML IJ SOLN: 10.0000 mg | INTRAMUSCULAR | Status: AC | PRN

## 2019-03-12 SURGICAL SUPPLY — 20 items
BALLN SAPPHIRE 2.5X12 (BALLOONS) ×2
BALLN SAPPHIRE ~~LOC~~ 3.5X12 (BALLOONS) ×1 IMPLANT
BALLN SAPPHIRE ~~LOC~~ 4.0X8 (BALLOONS) ×1 IMPLANT
BALLN ~~LOC~~ EUPHORA RX 2.25X8 (BALLOONS) ×2
BALLOON SAPPHIRE 2.5X12 (BALLOONS) IMPLANT
BALLOON ~~LOC~~ EUPHORA RX 2.25X8 (BALLOONS) IMPLANT
CATH 5FR JL3.5 JR4 ANG PIG MP (CATHETERS) ×1 IMPLANT
CATH LAUNCHER 6FR EBU3.5 (CATHETERS) ×1 IMPLANT
DEVICE RAD COMP TR BAND LRG (VASCULAR PRODUCTS) ×1 IMPLANT
GLIDESHEATH SLEND SS 6F .021 (SHEATH) ×1 IMPLANT
GUIDEWIRE INQWIRE 1.5J.035X260 (WIRE) IMPLANT
INQWIRE 1.5J .035X260CM (WIRE) ×2
KIT ENCORE 26 ADVANTAGE (KITS) ×1 IMPLANT
KIT HEART LEFT (KITS) ×2 IMPLANT
PACK CARDIAC CATHETERIZATION (CUSTOM PROCEDURE TRAY) ×2 IMPLANT
STENT SYNERGY DES 3X24 (Permanent Stent) ×1 IMPLANT
TRANSDUCER W/STOPCOCK (MISCELLANEOUS) ×2 IMPLANT
TUBING CIL FLEX 10 FLL-RA (TUBING) ×2 IMPLANT
WIRE HI TORQ BMW 190CM (WIRE) ×1 IMPLANT
WIRE RUNTHROUGH .014X180CM (WIRE) ×1 IMPLANT

## 2019-03-12 NOTE — ED Notes (Signed)
Vfib noted on monitor. Pt with no pulse. CPR initiated. EDP at bedside. Pt placed on pads. Vfib on zoll.   1837: Shocked pt at 120 J, Sinus brady after initial shock. ROSC  1838: Pt AO at this time. Bigeminy noted on monitor.

## 2019-03-12 NOTE — ED Provider Notes (Signed)
[name]   CSN: [id number] Arrival date & time: 03/12/19  1825    History   Chief Complaint Chief Complaint  Patient presents with  . Chest Pain    HPI Lance HaffJustin C [name] is a 43 y.o. male.     Patient started with chest pain about an hour ago has a history of coronary disease.  He presented to the emergency department diaphoretic with chest pain and promptly went into V. fib.  Patient had taken a couple nitros prior to arrival.  Patient was cardioverted with 1 shock.  The history is provided by the patient. No language interpreter was used.  Chest Pain  Pain location:  Substernal area Pain quality: aching   Pain radiates to:  Does not radiate Pain severity:  Moderate Onset quality:  Sudden Timing:  Constant Progression:  Worsening Chronicity:  Recurrent Context: breathing   Relieved by:  Nothing Worsened by:  Nothing Associated symptoms: no abdominal pain, no back pain, no cough, no fatigue and no headache     Past Medical History:  Diagnosis Date  . CAD (coronary artery disease), native coronary artery    10/18 PCI/DES to mRCA, and OM, with total occlusion of dLCx with collaterals.   . Essential hypertension   . GERD (gastroesophageal reflux disease)   . History of kidney stones   . Hyperlipemia   . SVT (supraventricular tachycardia) (HCC) 2015   Converted with adenosine  . Type 2 diabetes mellitus [facility](HCC)     Patient Active Problem List   Diagnosis Date Noted  . Incarcerated incisional hernia s/p redeuction/primary repair 11/12/2017 11/12/2017  . Chronic anticoagulation (Plavix) 11/12/2017  . Chronic appendicitis s/p lap appendectomy 11/12/2017 09/05/2017  . S/P drug eluting coronary stent placement   . CAD (coronary artery disease) 07/15/2017  . Severe obesity with body mass index (BMI) of 35.0 to 39.9 with comorbidity (HCC) 05/20/2017  . Family history of early CAD 12/29/2016  . Type 2 diabetes mellitus, uncontrolled (HCC)  08/31/2016  . Mixed hyperlipidemia 08/31/2016  . Current smoker 08/31/2016  . Essential hypertension, benign 04/21/2013    Past Surgical History:  Procedure Laterality Date  . CARDIAC SURGERY    . CORONARY ANGIOPLASTY WITH STENT PLACEMENT  07/15/2017   "2 stents"  . LAPAROSCOPIC APPENDECTOMY N/A 11/12/2017   Procedure: APPENDECTOMY LAPAROSCOPIC, REPAIR OF INCARCERATED INCISIONAL HERNIA;  Surgeon: Karie SodaGross, Steven, MD;  Location: [facility] ORS;  Service: General;  Laterality: N/A;  . LAPAROSCOPIC CHOLECYSTECTOMY    . LEFT HEART CATH AND CORONARY ANGIOGRAPHY N/A 07/15/2017   Procedure: LEFT HEART CATH AND CORONARY ANGIOGRAPHY;  Surgeon: Tonny Bollmanooper, Michael, MD;  [facility];  Service: Cardiovascular;  Laterality: N/A;        Home Medications    Prior to Admission medications   Medication Sig Start Date End Date Taking? Authorizing Provider  ALPRAZolam Prudy Feeler(XANAX) 1 MG tablet Take 1 mg by mouth daily as needed for anxiety.    [provider]  aspirin EC 81 MG tablet Take 81 mg by mouth daily.    [provider]  atorvastatin (LIPITOR) 80 MG tablet Take 1 tablet (80 mg total) by mouth daily. Patient taking differently: Take 80 mg by mouth at bedtime.  09/01/16   Standley BrookingGoodrich, Daniel P, MD  carvedilol (COREG) 3.125 MG tablet Take 1 tablet (3.125 mg total) by mouth 2 (two) times daily. 09/22/17 08/26/18  Jonelle SidleMcDowell, Samuel G, MD  Insulin Lispro (HUMALOG KWIKPEN) 200 UNIT/ML SOPN Inject 15-21 Units into  the skin 3 (three) times daily before meals. 02/14/19   Roma KayserNida, Gebreselassie W, MD  Insulin Pen Needle (PEN NEEDLES) 31G X 8 MM MISC 1 each by Does not apply route 4 (four) times daily. 01/11/19   Roma KayserNida, Gebreselassie W, MD  lisinopril (PRINIVIL,ZESTRIL) 40 MG tablet Take 40 mg by mouth daily.    [provider]  metFORMIN (GLUCOPHAGE-XR) 500 MG 24 hr tablet Take 1 tablet (500 mg total) by mouth 2 (two) times daily. 01/11/19   Roma KayserNida, Gebreselassie W, MD  nitroGLYCERIN (NITROSTAT)  0.4 MG SL tablet Place 1 tablet (0.4 mg total) under the tongue every 5 (five) minutes x 3 doses as needed for chest pain. 05/12/17   Rodolph Bonghompson, Daniel V, MD  Omega-3 1000 MG CAPS Take 1,000 mg by mouth 2 (two) times daily.    [provider]  TRESIBA FLEXTOUCH 200 UNIT/ML SOPN INJECT 90 UNITS INTO THE SKIN AT BEDTIME 02/14/19   Roma KayserNida, Gebreselassie W, MD    Family History Family History  Problem Relation Age of Onset  . Heart disease Mother 2662       CABG  . Diabetes Mother   . Gout Mother   . Heart attack Father 1057    Social History Social History   Tobacco Use  . Smoking status: Current Some Day Smoker    Packs/day: 0.50    Years: 26.00    Pack years: 13.00    Types: Cigarettes  . Smokeless tobacco: Never Used  Substance Use Topics  . Alcohol use: No  . Drug use: No     Allergies   Bee venom and Penicillins   Review of Systems Review of Systems  Constitutional: Negative for appetite change and fatigue.  HENT: Negative for congestion, ear discharge and sinus pressure.   Eyes: Negative for discharge.  Respiratory: Negative for cough.   Cardiovascular: Positive for chest pain.  Gastrointestinal: Negative for abdominal pain and diarrhea.  Genitourinary: Negative for frequency and hematuria.  Musculoskeletal: Negative for back pain.  Skin: Negative for rash.  Neurological: Negative for seizures and headaches.  Psychiatric/Behavioral: Negative for hallucinations.     Physical Exam Updated Vital Signs BP (!) 165/106 (BP Location: Right Arm)   Pulse 96   Temp 97.9 F (36.6 C) (Oral)   Resp 19   Ht 5\' 11"  (1.803 m)   Wt 122.5 kg   SpO2 98%   BMI 37.66 kg/m   Physical Exam Constitutional:      Appearance: He is well-developed.  HENT:     Head: Normocephalic.     Nose: Nose normal.  Eyes:     General: No scleral icterus.    Conjunctiva/sclera: Conjunctivae normal.  Neck:     Musculoskeletal: Neck supple.     Thyroid: No thyromegaly.   Cardiovascular:     Rate and Rhythm: Normal rate and regular rhythm.     Heart sounds: No murmur. No friction rub. No gallop.   Pulmonary:     Breath sounds: No stridor. No wheezing or rales.  Chest:     Chest wall: No tenderness.  Abdominal:     General: There is no distension.     Tenderness: There is no abdominal tenderness. There is no rebound.  Musculoskeletal: Normal range of motion.  Lymphadenopathy:     Cervical: No cervical adenopathy.  Skin:    Findings: No erythema or rash.  Neurological:     Mental Status: He is alert and oriented to person, place, and time.  Motor: No abnormal muscle tone.     Coordination: Coordination normal.  Psychiatric:        Behavior: Behavior normal.      ED Treatments / Results  Labs (all labs ordered are listed, but only abnormal results are displayed) Labs Reviewed  BASIC METABOLIC PANEL  CBC  TROPONIN I    EKG EKG Interpretation  Date/Time:  Saturday March 12 2019 18:30:32 EDT Ventricular Rate:  96 PR Interval:    QRS Duration: 95 QT Interval:  350 QTC Calculation: 443 R Axis:   59 Text Interpretation:  Sinus rhythm Repol abnrm suggests ischemia, inferior leads ST elevation, consider anterolateral injury Confirmed by Milton Ferguson [phone]) on 03/12/2019 6:42:00 PM   Radiology No results found.  Procedures Procedures (including critical care time)  Medications Ordered in ED Medications  sodium chloride flush (NS) 0.9 % injection 3 mL (has no administration in time range)     Initial Impression / Assessment and Plan / ED Course  I have reviewed the triage vital signs and the nursing notes.  Pertinent labs & imaging results that were available during my care of the patient were reviewed by me and considered in my medical decision making (see chart for details).    CRITICAL CARE Performed by: Milton Ferguson Total critical care time:20 minutes Critical care time was exclusive of separately billable procedures and  treating other patients. Critical care was necessary to treat or prevent imminent or life-threatening deterioration. Critical care was time spent personally by me on the following activities: development of treatment plan with patient and/or surrogate as well as nursing, discussions with consultants, evaluation of patient's response to treatment, examination of patient, obtaining history from patient or surrogate, ordering and performing treatments and interventions, ordering and review of laboratory studies, ordering and review of radiographic studies, pulse oximetry and re-evaluation of patient's condition.  Patient initially presented with chest pain and sweating.  He went into V. fib while in the room.  Patient was defibrillated once and returned to a sinus rhythm with good blood pressure.  EKG shows inverted T waves inferiorly and some ST elevation anteriorly.  A STEMI was called on the patient and cardiology was consulted at [facility].  Cardiology agreed with immediate transport to [facility] and he was started on amiodarone drip with heparin      Final Clinical Impressions(s) / ED Diagnoses   Final diagnoses:  None    ED Discharge Orders    None       Milton Ferguson, MD 03/12/19 [phone]

## 2019-03-12 NOTE — Brief Op Note (Signed)
BRIEF CARDIAC CATHETERIZATION NOTE  DATE: 03/12/2019  TIME: 10:03 PM  PATIENT:  Lance [name]  43 y.o. male  PRE-OPERATIVE DIAGNOSIS:  STEMI  POST-OPERATIVE DIAGNOSIS:  STEMI  PROCEDURE:  Procedure(s): Coronary/Graft Acute MI Revascularization (N/A) LEFT HEART CATH AND CORONARY ANGIOGRAPHY (N/A)  FINDINGS: 1. Severe 2-vessel CAD with 90% stenosis of proximal LAD just beyond D1 with thrombos (some of which had embolized to the apical LAD) and 99% in-stent restenosis of OM1 and 90% stenosis of jailed mid LCx. 2. Mild ISR of RCA stent. 3. Moderately elevated LVEDP. 4. Successful PCI to proximal LAD using Synergy 3.0 x 2 mm DES (post-dilated proximally to 4.0 mm) with 0% residual stenosis and TIMI-3 flow. 5. Success balloon angioplasty to LCx/OM1 stent using Adrian Ephora 2.25 x 8 mm and Sapphire 2.5 x 12 mm balloons.  RECOMMENDATIONS: 1. Tirofiban infusion x 18 hours. 2. DAPT with ASA and ticagrelor for at least 12 months, ideally longer. 3. Aggressive secondary prevention. 4. Obtain echo.  Nelva Bush, MD [facility] Pager: [phone]

## 2019-03-12 NOTE — H&P (Signed)
Cardiology Admission History and Physical:   Patient ID: Lance [name] MRN: [id number]; DOB: 10-29-1975   Admission date: 03/12/2019  Primary Care Provider: [facility], Lance [name] Primary Cardiologist: Rozann Lesches, MD  Primary Electrophysiologist:  None   Chief Complaint:  Chest pain  Patient Profile:   Lance [name] is a 43 y.o. male with history of CAD s/p PCI to LCx/OM and RCA in 2018, hypertension, hyperlipidemia, type 2 diabetes mellitus, and obesity, presenting from [facility] with chest pain, ventricular fibrillation, and EKG concerning for anterolateral STEMI.  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  He had acute onset of substernal chest pain without radiation that did not improve with sublingual NTG.  He presented to the [facility] ED and had ventricular fibrillation arrest shortly after arrival.  He as successfully defibrillated with ROSC.  Upon arrival at [facility], he continued to have mild right-sided chest pain.  He denies shortness of breath, palpitations, and lightheadedness.  He has been compliant with his medications.  However, he notes over the last several months, he has had a few episodes of brief substernal chest pain when walking.  These typically resolve promptly with rest.  Lance [name] underwent emergent cardiac catheterization, revealing acute plaque rupture with thrombus formation and 90% stenosis of the proximal LAD.  There was also embolization of thrombus into the apical LAD, which was 100% occluded.  Previously stented segment of the LCx and OM1 showed severe in-stent restenosis in OM1 as well as a focal severe stenosis in the jailed mid LCx.  He underwent successful primary PCI to the proximal LAD and balloon angioplasty to LCx/OM1.  He is now chest pain-free.   Past Medical History:  Diagnosis Date  . CAD (coronary  artery disease), native coronary artery    10/18 PCI/DES to mRCA, and OM, with total occlusion of dLCx with collaterals.   . Essential hypertension   . GERD (gastroesophageal reflux disease)   . History of kidney stones   . Hyperlipemia   . SVT (supraventricular tachycardia) ([place]) 2015   Converted with adenosine  . Type 2 diabetes mellitus ([place])     Past Surgical History:  Procedure Laterality Date  . CARDIAC SURGERY    . CORONARY ANGIOPLASTY WITH STENT PLACEMENT  07/15/2017   "2 stents"  . LAPAROSCOPIC APPENDECTOMY N/A 11/12/2017   Procedure: APPENDECTOMY LAPAROSCOPIC, REPAIR OF INCARCERATED INCISIONAL HERNIA;  Surgeon: Michael Boston, MD;  Location: [facility] ORS;  Service: General;  Laterality: N/A;  . LAPAROSCOPIC CHOLECYSTECTOMY    . LEFT HEART CATH AND CORONARY ANGIOGRAPHY N/A 07/15/2017   Procedure: LEFT HEART CATH AND CORONARY ANGIOGRAPHY;  Surgeon: Sherren Mocha, MD;  Location: [facility] CV LAB;  Service: Cardiovascular;  Laterality: N/A;     Medications Prior to Admission: Prior to Admission medications   Medication Sig Start Date Lance [name] Date Taking? Authorizing Provider  ALPRAZolam Duanne Moron) 1 MG tablet Take 1 mg by mouth daily as needed for anxiety.    [provider]  aspirin EC 81 MG tablet Take 81 mg by mouth daily.    [provider]  atorvastatin (LIPITOR) 80 MG tablet Take 1 tablet (80 mg total) by mouth daily. Patient taking differently: Take 80 mg by mouth at bedtime.  09/01/16   Samuella Cota, MD  carvedilol (COREG) 3.125 MG tablet Take 1 tablet (3.125 mg total) by mouth  2 (two) times daily. 09/22/17 08/26/18  Jonelle SidleMcDowell, Samuel G, MD  Insulin Lispro (HUMALOG KWIKPEN) 200 UNIT/ML SOPN Inject 15-21 Units into the skin 3 (three) times daily before meals. 02/14/19   Roma KayserNida, Gebreselassie W, MD  Insulin Pen Needle (PEN NEEDLES) 31G X 8 MM MISC 1 each by Does not apply route 4 (four) times daily. 01/11/19   Roma KayserNida, Gebreselassie W, MD  lisinopril (PRINIVIL,ZESTRIL)  40 MG tablet Take 40 mg by mouth daily.    [provider]  metFORMIN (GLUCOPHAGE-XR) 500 MG 24 hr tablet Take 1 tablet (500 mg total) by mouth 2 (two) times daily. 01/11/19   Roma KayserNida, Gebreselassie W, MD  nitroGLYCERIN (NITROSTAT) 0.4 MG SL tablet Place 1 tablet (0.4 mg total) under the tongue every 5 (five) minutes x 3 doses as needed for chest pain. 05/12/17   Rodolph Bonghompson, Daniel V, MD  Omega-3 1000 MG CAPS Take 1,000 mg by mouth 2 (two) times daily.    [provider]  TRESIBA FLEXTOUCH 200 UNIT/ML SOPN INJECT 90 UNITS INTO THE SKIN AT BEDTIME 02/14/19   Roma KayserNida, Gebreselassie W, MD     Allergies:    Allergies  Allergen Reactions  . Bee Venom Anaphylaxis and Swelling  . Penicillins Hives, Itching and Rash    Has patient had a PCN reaction causing immediate rash, facial/tongue/throat swelling, SOB or lightheadedness with hypotension: No Has patient had a PCN reaction causing severe rash involving mucus membranes or skin necrosis: No Has patient had a PCN reaction that required hospitalization: No Has patient had a PCN reaction occurring within the last 10 years: No If all of the above answers are "NO", then may proceed with Cephalosporin use.     Social History:   Social History   Tobacco Use  . Smoking status: Current Some Day Smoker    Packs/day: 0.50    Years: 26.00    Pack years: 13.00    Types: Cigarettes  . Smokeless tobacco: Never Used  Substance Use Topics  . Alcohol use: No  . Drug use: No    [name] History:  The patient's [name] history includes Diabetes in his mother; Gout in his mother; Heart attack (age of onset: 6857) in his father; Heart disease (age of onset: 3662) in his mother.    ROS:  Please see the history of present illness.   All other ROS reviewed and negative.     Physical Exam/Data:   Vitals:   03/12/19 1830 03/12/19 1831 03/12/19 1845  BP: (!) 165/106 (!) 165/106 (!) 141/84  Pulse: 95 96 95  Resp: (!) 21 19 (!) 21  Temp:  97.9 F (36.6 C)    TempSrc:  Oral   SpO2: 95% 98% 100%  Weight:  122.5 kg   Height:  5\' 11"  (1.803 m)    No intake or output data in the 24 hours ending 03/12/19 1941 Last 3 Weights 03/12/2019 11/30/2018 08/26/2018  Weight (lbs) 270 lb 281 lb 279 lb  Weight (kg) 122.471 kg 127.461 kg 126.554 kg     Body mass index is 37.66 kg/m.  General:  Well nourished, well developed, in no acute distress HEENT: normal Lymph: no adenopathy Neck: no JVD Endocrine:  No thryomegaly Vascular: 2+ radial and femoral pulses bilaterally Cardiac:  normal S1, S2; RRR; no murmurs, rubs, or gallops. Lungs:  clear to auscultation bilaterally, no wheezing, rhonchi or rales  Abd: soft, nontender, no hepatomegaly  Ext: no tremor edema Musculoskeletal:  No deformities, BUE and BLE strength normal and equal  Skin: warm and dry  Neuro:  CNs 2-12 intact, no focal abnormalities noted Psych:  Normal affect    EKG:  The ECG that was done today at 18:30 was personally reviewed and demonstrates normal sinus rhythm with anteroseptal ST elevation as well as ST elevation in aVL.  There are also reciprocal ST depressions in the inferior leads.  Relevant CV Studies: LHC/PCI (03/12/2019, prelim): 1. Severe 2-vessel CAD with 90% stenosis of proximal LAD just beyond D1 with thrombos (some of which had embolized to the apical LAD) and 99% in-stent restenosis of OM1 and 90% stenosis of jailed mid LCx. 2. Mild ISR of RCA stent. 3. Moderately elevated LVEDP. 4. Successful PCI to proximal LAD using Synergy 3.0 x 2 mm DES (post-dilated proximally to 4.0 mm) with 0% residual stenosis and TIMI-3 flow. 5. Success balloon angioplasty to LCx/OM1 stent using [place] Ephora 2.25 x 8 mm and Sapphire 2.5 x 12 mm balloons.  Laboratory Data:  Chemistry Recent Labs  Lab 03/12/19 1830  NA 141  K 3.4*  CL 105  CO2 24  GLUCOSE 133*  BUN 15  CREATININE 1.25*  CALCIUM 9.9  GFRNONAA >60  GFRAA >60  ANIONGAP 12    No results for input(s): PROT, ALBUMIN, AST,  ALT, ALKPHOS, BILITOT in the last 168 hours. Hematology Recent Labs  Lab 03/12/19 1830  WBC 22.8*  RBC 5.26  HGB 15.5  HCT 46.3  MCV 88.0  MCH 29.5  MCHC 33.5  RDW 12.9  PLT 303   Cardiac Enzymes Recent Labs  Lab 03/12/19 1830  TROPONINI <0.03    Recent Labs  Lab 03/12/19 1842  TROPIPOC 0.01    BNPNo results for input(s): BNP, PROBNP in the last 168 hours.  DDimer No results for input(s): DDIMER in the last 168 hours.  Radiology/Studies:  Dg Chest Portable 1 View  Result Date: 03/12/2019 CLINICAL DATA:  43 year old male with history of ST-elevation myocardial infarction. EXAM: PORTABLE CHEST 1 VIEW COMPARISON:  Chest x-ray 02/27/2018. FINDINGS: Transcutaneous defibrillator pads project over the left hemithorax. Lung volumes are low. Patient is in a very lordotic position. Lung volumes are low. No consolidative airspace disease. No pleural effusions. No pneumothorax. No pulmonary nodule or mass noted. Pulmonary vasculature and the cardiomediastinal silhouette are within normal limits. IMPRESSION: 1. Low lung volumes without radiographic evidence of acute cardiopulmonary disease. Electronically Signed   By: Trudie Reedaniel  Entrikin M.D.   On: 03/12/2019 19:12    Assessment and Plan:   STEMI: Patient had acute onset of chest pain was found to have acute plaque rupture with thrombus formation and 90% stenosis of the proximal LAD as well as thrombotic occlusion of the apical LAD.  He underwent successful PCI to the proximal LAD.  Occlusion of the apical LAD remains, which we will treat with tirofiban infusion.  He also underwent PCI to severe in-stent restenosis/de novo lesion involving the proximal and mid LCx extending into OM1.  Admit to [facility] ICU for post STEMI care.  Continue tirofiban infusion for 18 hours.  Wean nitroglycerin infusion, as tolerated.  Dual antiplatelet therapy with aspirin and ticagrelor for at least 12 months, ideally longer.  Aggressive secondary prevention  continued high intensity statin therapy and smoking cessation.  Obtain echocardiogram to better evaluate LVEF.  If patient has refractory chest pain, repeat intervention to bifurcation lesion involving LCx/OM1, though given aggressive in-stent restenosis, I favor medical therapy at this time.  Ischemic cardiomyopathy: Patient noted to have moderately elevated LVEDP.  I suspect there is  at least some anterior hypokinesis based on the left ventriculogram, though hand-injection was not diagnostic.  Continue carvedilol 3.125 mg twice daily with up titration as tolerated.  Consider restarting ACE inhibitor prior to discharge, as blood pressure allows.  Gentle diuresis.  Obtain echocardiogram.  Ventricular fibrillation arrest: Likely due to STEMI, with successful defibrillation.  Patient was given IV amiodarone x1 prior to transfer.  Monitor on telemetry.  Continue carvedilol with up titration as tolerated.  No indication for ICD placement at this time, given revascularization.  If LVEF less than 35%, would recommend consideration of LifeVest placement prior to discharge.  Diabetes mellitus:  Check hemoglobin A1c.  Sliding scale insulin.  Home Lantus insulin dose.  Hyperlipidemia: LDL at goal on last check in February.  Recheck lipid panel in the morning.  Continue atorvastatin 80 mg daily.  Tobacco abuse: Patient states that he plans to quit at this time.  Encouraged tobacco cessation.  Severity of Illness: The appropriate patient status for this patient is INPATIENT. Inpatient status is judged to be reasonable and necessary in order to provide the required intensity of service to ensure the patient's safety. The patient's presenting symptoms, physical exam findings, and initial radiographic and laboratory data in the context of their chronic comorbidities is felt to place them at high risk for further clinical deterioration. Furthermore, it is not anticipated that the patient  will be medically stable for discharge from the hospital within 2 midnights of admission. The following factors support the patient status of inpatient.   " The patient's presenting symptoms include chest pain and ventricular fibrillation at outside hospital. " The initial radiographic and laboratory data are worrisome because of EKG demonstrating ST elevation. " The chronic co-morbidities include known coronary artery disease, hypertension, hyperlipidemia, and type 2 diabetes mellitus.   * I certify that at the point of admission it is my clinical judgment that the patient will require inpatient hospital care spanning beyond 2 midnights from the point of admission due to high intensity of service, high risk for further deterioration and high frequency of surveillance required.*    For questions or updates, please contact [facility] Please consult [email or web address] for contact info under    Signed, Yvonne Kendallhristopher Jefte Carithers, MD  03/12/2019 7:41 PM

## 2019-03-12 NOTE — Progress Notes (Signed)
Called to pt's room for Code Blue. Pt on NRB with stable vitals when I arrived

## 2019-03-12 NOTE — ED Triage Notes (Signed)
Patient c/o central chest pain, non-radiating. Per patient shortness of breath and diaphoresis. Denies any nausea, vomiting, or dizziness. Patient has hx of cardiac stents-per patient pain is worse. Patient has used x2 SL nitro with brief relief. Patient states he has been outside all day and has not been drinking water.

## 2019-03-13 ENCOUNTER — Inpatient Hospital Stay (HOSPITAL_COMMUNITY): Payer: 59

## 2019-03-13 DIAGNOSIS — I10 Essential (primary) hypertension: Secondary | ICD-10-CM

## 2019-03-13 DIAGNOSIS — I472 Ventricular tachycardia: Secondary | ICD-10-CM

## 2019-03-13 DIAGNOSIS — I213 ST elevation (STEMI) myocardial infarction of unspecified site: Secondary | ICD-10-CM

## 2019-03-13 LAB — LIPID PANEL
Cholesterol: 124 mg/dL (ref 0–200)
HDL: 20 mg/dL — ABNORMAL LOW (ref >40)
LDL Cholesterol: 57 mg/dL (ref 0–99)
Total CHOL/HDL Ratio: 6.2 RATIO
Triglycerides: 234 mg/dL — ABNORMAL HIGH (ref <150)
VLDL: 47 mg/dL — ABNORMAL HIGH (ref 0–40)

## 2019-03-13 LAB — ECHOCARDIOGRAM COMPLETE
Height: 71 in
Weight: 4490.33 oz

## 2019-03-13 LAB — BASIC METABOLIC PANEL
Anion gap: 11 (ref 5–15)
BUN: 13 mg/dL (ref 6–20)
CO2: 26 mmol/L (ref 22–32)
Calcium: 9.3 mg/dL (ref 8.9–10.3)
Chloride: 102 mmol/L (ref 98–111)
Creatinine, Ser: 1.1 mg/dL (ref 0.61–1.24)
GFR calc Af Amer: >60 mL/min (ref >60)
GFR calc non Af Amer: >60 mL/min (ref >60)
Glucose, Bld: 247 mg/dL — ABNORMAL HIGH (ref 70–99)
Potassium: 3.7 mmol/L (ref 3.5–5.1)
Sodium: 139 mmol/L (ref 135–145)

## 2019-03-13 LAB — GLUCOSE, CAPILLARY
Glucose-Capillary: 126 mg/dL — ABNORMAL HIGH (ref 70–99)
Glucose-Capillary: 127 mg/dL — ABNORMAL HIGH (ref 70–99)
Glucose-Capillary: 161 mg/dL — ABNORMAL HIGH (ref 70–99)
Glucose-Capillary: 196 mg/dL — ABNORMAL HIGH (ref 70–99)
Glucose-Capillary: 241 mg/dL — ABNORMAL HIGH (ref 70–99)
Glucose-Capillary: 271 mg/dL — ABNORMAL HIGH (ref 70–99)

## 2019-03-13 LAB — TROPONIN I
Troponin I: 5.52 ng/mL (ref <0.03)
Troponin I: 14.52 ng/mL (ref <0.03)
Troponin I: 16.14 ng/mL (ref <0.03)
Troponin I: 16.86 ng/mL (ref <0.03)

## 2019-03-13 LAB — HEMOGLOBIN A1C
Hgb A1c MFr Bld: 7.7 % — ABNORMAL HIGH (ref 4.8–5.6)
Mean Plasma Glucose: 174.29 mg/dL

## 2019-03-13 LAB — CBC
HCT: 43.9 % (ref 39.0–52.0)
Hemoglobin: 14.9 g/dL (ref 13.0–17.0)
MCH: 29.7 pg (ref 26.0–34.0)
MCHC: 33.9 g/dL (ref 30.0–36.0)
MCV: 87.6 fL (ref 80.0–100.0)
Platelets: 231 10*3/uL (ref 150–400)
RBC: 5.01 MIL/uL (ref 4.22–5.81)
RDW: 13.2 % (ref 11.5–15.5)
WBC: 16.4 10*3/uL — ABNORMAL HIGH (ref 4.0–10.5)
nRBC: 0 % (ref 0.0–0.2)

## 2019-03-13 MED ORDER — AMIODARONE LOAD VIA INFUSION
150.0000 mg | Freq: Once | INTRAVENOUS | Status: AC
  Administered 2019-03-13: 150 mg via INTRAVENOUS
  Filled 2019-03-13: qty 83.34

## 2019-03-13 MED ORDER — LOSARTAN POTASSIUM 50 MG PO TABS
100.0000 mg | ORAL_TABLET | Freq: Every day | ORAL | Status: DC
  Administered 2019-03-14 – 2019-03-16 (×3): 100 mg via ORAL
  Filled 2019-03-13 (×3): qty 2

## 2019-03-13 MED ORDER — LOSARTAN POTASSIUM 50 MG PO TABS
50.0000 mg | ORAL_TABLET | Freq: Once | ORAL | Status: AC
  Administered 2019-03-13: 50 mg via ORAL
  Filled 2019-03-13: qty 1

## 2019-03-13 MED ORDER — LOSARTAN POTASSIUM 50 MG PO TABS
50.0000 mg | ORAL_TABLET | Freq: Every day | ORAL | Status: DC
  Administered 2019-03-13: 50 mg via ORAL
  Filled 2019-03-13: qty 1

## 2019-03-13 MED ORDER — POTASSIUM CHLORIDE CRYS ER 20 MEQ PO TBCR
40.0000 meq | EXTENDED_RELEASE_TABLET | Freq: Once | ORAL | Status: AC
  Administered 2019-03-13: 40 meq via ORAL
  Filled 2019-03-13: qty 2

## 2019-03-13 MED ORDER — PERFLUTREN LIPID MICROSPHERE
INTRAVENOUS | Status: AC
  Administered 2019-03-13: 2 mL via INTRAVENOUS
  Filled 2019-03-13: qty 10

## 2019-03-13 MED ORDER — AMIODARONE HCL IN DEXTROSE 360-4.14 MG/200ML-% IV SOLN
30.0000 mg/h | INTRAVENOUS | Status: DC
  Administered 2019-03-13 – 2019-03-14 (×2): 30 mg/h via INTRAVENOUS
  Filled 2019-03-13 (×2): qty 200

## 2019-03-13 MED ORDER — CARVEDILOL 6.25 MG PO TABS: 6.2500 mg | ORAL_TABLET | Freq: Two times a day (BID) | ORAL | Status: DC

## 2019-03-13 MED ORDER — PERFLUTREN LIPID MICROSPHERE
1.0000 mL | INTRAVENOUS | Status: AC | PRN
  Administered 2019-03-13: 2 mL via INTRAVENOUS
  Filled 2019-03-13: qty 10

## 2019-03-13 MED ORDER — AMIODARONE HCL IN DEXTROSE 360-4.14 MG/200ML-% IV SOLN
60.0000 mg/h | INTRAVENOUS | Status: AC
  Administered 2019-03-13 (×2): 60 mg/h via INTRAVENOUS
  Filled 2019-03-13 (×2): qty 200

## 2019-03-13 NOTE — Progress Notes (Signed)
  Echocardiogram 2D Echocardiogram has been performed.  Lance [name] 03/13/2019, 2:29 PM

## 2019-03-13 NOTE — Progress Notes (Signed)
Progress Note  Patient Name: Joellyn HaffJustin C Wileman Date of Encounter: 03/13/2019  Primary Cardiologist: Nona DellSamuel McDowell, MD   Subjective   43 year old gentleman with a history of coronary artery disease was admitted last night with ST segment elevation myocardial infarction.  He is now status post PCI of his proximal and mid left circumflex artery/OM1.  Patient has history of hypertension, hyperlipidemia, type 2 diabetes mellitus and obesity.  He presented in transfer from [facility] with chest pain, ventricular fibrillation and EKG concerning for an anterior lateral ST segment elevation myocardial infarction.  Recent lipid profile reveals total cholesterol of 124.  The HDL is 20.  His LDL is 57.  The triglyceride level is 234.  COVID screening is negative.  He had some nonsustained ventricular tachycardia.  He has been started on amiodarone drip.  He has been started on carvedilol 3.125 mg twice a day. Was smoking - said he has stopped as of yesterday   Inpatient Medications    Scheduled Meds: . aspirin EC  81 mg Oral Daily  . atorvastatin  80 mg Oral QHS  . carvedilol  3.125 mg Oral BID WC  . enoxaparin (LOVENOX) injection  40 mg Subcutaneous Q24H  . insulin aspart  0-15 Units Subcutaneous TID WC  . insulin glargine  90 Units Subcutaneous QHS  . omega-3 acid ethyl esters  1,000 mg Oral BID  . sodium chloride flush  3 mL Intravenous Once  . sodium chloride flush  3 mL Intravenous Q12H  . ticagrelor  90 mg Oral BID   Continuous Infusions: . sodium chloride    . amiodarone 30 mg/hr (03/13/19 0939)  . nitroGLYCERIN 5 mcg/min (03/12/19 2300)  . tirofiban 0.15 mcg/kg/min (03/13/19 0540)   PRN Meds: sodium chloride, acetaminophen, ALPRAZolam, nitroGLYCERIN, ondansetron (ZOFRAN) IV, sodium chloride flush   Vital Signs    Vitals:   03/13/19 0915 03/13/19 0930 03/13/19 0945 03/13/19 1000  BP: (!) 167/98 (!) 167/90 (!) 150/95 (!) 156/97  Pulse: 80 77 75 70  Resp: 16 19 18  17   Temp:      TempSrc:      SpO2: 96% 97% 96% 97%  Weight:      Height:        Intake/Output Summary (Last 24 hours) at 03/13/2019 1024 Last data filed at 03/13/2019 1000 Gross per 24 hour  Intake 1338.74 ml  Output 2475 ml  Net -1136.26 ml   Last 3 Weights 03/12/2019 03/12/2019 11/30/2018  Weight (lbs) 280 lb 10.3 oz 270 lb 281 lb  Weight (kg) 127.3 kg 122.471 kg 127.461 kg      Telemetry    NSR , nonsustained VT and some AIVR early this am  - Personally Reviewed  ECG     NSR ,  The ant. ST elevation and inferoir ST depression have resolved.  - Personally Reviewed  Physical Exam   GEN: obese, young male, NAD  Neck: No JVD Cardiac: RRR, no murmurs, rubs, or gallops.  Respiratory: Clear to auscultation bilaterally. GI: Soft, nontender, non-distended  MS: No edema; No deformity. Neuro:  Nonfocal  Psych: Normal affect   Labs    Chemistry Recent Labs  Lab 03/12/19 1830 03/13/19 0547  NA 141 139  K 3.4* 3.7  CL 105 102  CO2 24 26  GLUCOSE 133* 247*  BUN 15 13  CREATININE 1.25* 1.10  CALCIUM 9.9 9.3  GFRNONAA >60 >60  GFRAA >60 >60  ANIONGAP 12 11     Hematology Recent Labs  Lab 03/12/19 1830 03/13/19 0547  WBC 22.8* 16.4*  RBC 5.26 5.01  HGB 15.5 14.9  HCT 46.3 43.9  MCV 88.0 87.6  MCH 29.5 29.7  MCHC 33.5 33.9  RDW 12.9 13.2  PLT 303 231    Cardiac Enzymes Recent Labs  Lab 03/12/19 1830 03/12/19 2323 03/13/19 0547  TROPONINI <0.03 5.52* 14.52*    Recent Labs  Lab 03/12/19 1842  TROPIPOC 0.01     BNPNo results for input(s): BNP, PROBNP in the last 168 hours.   DDimer No results for input(s): DDIMER in the last 168 hours.   Radiology    Dg Chest Portable 1 View  Result Date: 03/12/2019 CLINICAL DATA:  43 year old male with history of ST-elevation myocardial infarction. EXAM: PORTABLE CHEST 1 VIEW COMPARISON:  Chest x-ray 02/27/2018. FINDINGS: Transcutaneous defibrillator pads project over the left hemithorax. Lung volumes are low.  Patient is in a very lordotic position. Lung volumes are low. No consolidative airspace disease. No pleural effusions. No pneumothorax. No pulmonary nodule or mass noted. Pulmonary vasculature and the cardiomediastinal silhouette are within normal limits. IMPRESSION: 1. Low lung volumes without radiographic evidence of acute cardiopulmonary disease. Electronically Signed   By: Vinnie Langton M.D.   On: 03/12/2019 19:12    Cardiac Studies      Patient Profile     43 y.o. male with CAD.   Transferred from [place] with ant. STEMI   Assessment & Plan    1.   Ant. STEMI:   S/p stenting of the left anterior descending artery in the left circumflex marginal vessel.  He is doing very well.  His EKG does not show any acute changes any longer.  Continue current medications.  2.  Ventricular tachycardia: The patient had ventricular fibrillation during his presentation last night.  He continues to have some episodes of nonsustained VT.  He has been placed on amiodarone drip.  He is been on carvedilol but only tolerates low-dose.  .  3.  Hypertension: We will start losartan 50 mg a day.  He was previously on lisinopril 40 mg a day.  4.  Diabetes mellitus: His glucose levels are elevated.  He was previously on metformin but this is being held because of his heart catheterization.  Will anticipate starting that again in several days. For questions or updates, please contact [place] Please consult [email or web address] for contact info under        Signed, Mertie Moores, MD  03/13/2019, 10:24 AM

## 2019-03-13 NOTE — Progress Notes (Signed)
Paged Cardiologist on call regarding increased runs of [place]. Pt was asymptomatic and vitals remained stable.  Received verbal order to start amiodarone bolus and drip.

## 2019-03-13 NOTE — Progress Notes (Signed)
Patient states that he cannot take coreg over  His regular dose. The last time it was increased, he got nauseaous and vomited and was sick for 2 days. Cards paged and discussed. Will leave at current rate for now, requesting that it is addressed in AM

## 2019-03-14 ENCOUNTER — Encounter (HOSPITAL_COMMUNITY): Payer: Self-pay | Admitting: Internal Medicine

## 2019-03-14 DIAGNOSIS — E782 Mixed hyperlipidemia: Secondary | ICD-10-CM

## 2019-03-14 DIAGNOSIS — I4901 Ventricular fibrillation: Secondary | ICD-10-CM

## 2019-03-14 DIAGNOSIS — I2101 ST elevation (STEMI) myocardial infarction involving left main coronary artery: Secondary | ICD-10-CM

## 2019-03-14 DIAGNOSIS — R0683 Snoring: Secondary | ICD-10-CM

## 2019-03-14 LAB — GLUCOSE, CAPILLARY
Glucose-Capillary: 110 mg/dL — ABNORMAL HIGH (ref 70–99)
Glucose-Capillary: 176 mg/dL — ABNORMAL HIGH (ref 70–99)
Glucose-Capillary: 137 mg/dL — ABNORMAL HIGH (ref 70–99)
Glucose-Capillary: 213 mg/dL — ABNORMAL HIGH (ref 70–99)

## 2019-03-14 LAB — CBC
HCT: 46.1 % (ref 39.0–52.0)
Hemoglobin: 15.7 g/dL (ref 13.0–17.0)
MCH: 30 pg (ref 26.0–34.0)
MCHC: 34.1 g/dL (ref 30.0–36.0)
MCV: 88 fL (ref 80.0–100.0)
Platelets: 190 10*3/uL (ref 150–400)
RBC: 5.24 MIL/uL (ref 4.22–5.81)
RDW: 13.1 % (ref 11.5–15.5)
WBC: 14.4 10*3/uL — ABNORMAL HIGH (ref 4.0–10.5)
nRBC: 0 % (ref 0.0–0.2)

## 2019-03-14 LAB — BASIC METABOLIC PANEL
Anion gap: 9 (ref 5–15)
BUN: 14 mg/dL (ref 6–20)
CO2: 24 mmol/L (ref 22–32)
Calcium: 9 mg/dL (ref 8.9–10.3)
Chloride: 102 mmol/L (ref 98–111)
Creatinine, Ser: 1.01 mg/dL (ref 0.61–1.24)
GFR calc Af Amer: >60 mL/min (ref >60)
GFR calc non Af Amer: >60 mL/min (ref >60)
Glucose, Bld: 152 mg/dL — ABNORMAL HIGH (ref 70–99)
Potassium: 4 mmol/L (ref 3.5–5.1)
Sodium: 135 mmol/L (ref 135–145)

## 2019-03-14 LAB — POCT ACTIVATED CLOTTING TIME
Activated Clotting Time: 313 seconds
Activated Clotting Time: 274 seconds
Activated Clotting Time: 296 seconds

## 2019-03-14 LAB — MAGNESIUM: Magnesium: 2 mg/dL (ref 1.7–2.4)

## 2019-03-14 MED ORDER — CHLORHEXIDINE GLUCONATE CLOTH 2 % EX PADS
6.0000 | MEDICATED_PAD | Freq: Every day | CUTANEOUS | Status: DC
  Administered 2019-03-14 – 2019-03-15 (×2): 6 via TOPICAL

## 2019-03-14 MED ORDER — CARVEDILOL 3.125 MG PO TABS: 3.1250 mg | ORAL_TABLET | Freq: Two times a day (BID) | ORAL | Status: DC

## 2019-03-14 MED ORDER — CARVEDILOL 6.25 MG PO TABS
6.2500 mg | ORAL_TABLET | Freq: Two times a day (BID) | ORAL | Status: DC
  Administered 2019-03-14 – 2019-03-16 (×5): 6.25 mg via ORAL
  Filled 2019-03-14 (×5): qty 1

## 2019-03-14 MED ORDER — POTASSIUM CHLORIDE CRYS ER 20 MEQ PO TBCR
40.0000 meq | EXTENDED_RELEASE_TABLET | Freq: Once | ORAL | Status: AC
  Administered 2019-03-14: 40 meq via ORAL
  Filled 2019-03-14: qty 2

## 2019-03-14 MED FILL — Heparin Sodium (Porcine) Inj 1000 Unit/ML: INTRAMUSCULAR | Qty: 10 | Status: AC

## 2019-03-14 NOTE — TOC Benefit Eligibility Note (Signed)
Transition of Care (TOC) Benefit Eligibility Note    Patient Details  Name: Lance [name] MRN: [id number] Date of Birth: 12/31/1975   Medication/Dose: BRILINTA  90 MG BID  Covered?: Yes  Tier: 3 Drug  Prescription Coverage Preferred Pharmacy: YES([facility])  Spoke with Person/Company/Phone Number:: MARY([employer] # [phone])  Co-Pay: $85.00   Q/L TWO PILL PER DAY  Prior Approval: No  Deductible: Met(OUT-OF-POCKET- NOT MET)  Additional Notes: VASCEPA    COVER- YES, PRIOR APPROVAL- YES  $ [phone]    [name], [name] Phone Number: 03/14/2019, 3:47 PM     

## 2019-03-14 NOTE — Progress Notes (Signed)
CARDIAC REHAB PHASE I   PRE:  Rate/Rhythm: 78 SR  BP:  Supine: 116/54  Sitting:   Standing:    SaO2: 96%RA  MODE:  Ambulation: 520 ft   POST:  Rate/Rhythm: 95 SR  BP:  Supine:   Sitting: 117/83  Standing:    SaO2: 96%RA 1115-1147 Pt walked 520 ft on RA with steady gait and then to chair. No CP. Lunch here. Gave pt MI and stent booklet. Will complete ed tomorrow. Tolerated walk well.   Graylon Good, RN BSN  03/14/2019 11:42 AM

## 2019-03-14 NOTE — Progress Notes (Signed)
Progress Note  Patient Name: Lance HaffJustin C Bryk Date of Encounter: 03/14/2019  Primary Cardiologist: Dr. Diona BrownerMcDowell  Subjective   No recurrent chest pain or recurrent ventricular arrythmnia  Inpatient Medications    Scheduled Meds: . aspirin EC  81 mg Oral Daily  . atorvastatin  80 mg Oral QHS  . carvedilol  6.25 mg Oral BID WC  . enoxaparin (LOVENOX) injection  40 mg Subcutaneous Q24H  . insulin aspart  0-15 Units Subcutaneous TID WC  . insulin glargine  90 Units Subcutaneous QHS  . losartan  100 mg Oral Daily  . omega-3 acid ethyl esters  1,000 mg Oral BID  . sodium chloride flush  3 mL Intravenous Once  . sodium chloride flush  3 mL Intravenous Q12H  . ticagrelor  90 mg Oral BID   Continuous Infusions: . sodium chloride    . amiodarone 30 mg/hr (03/14/19 0600)  . nitroGLYCERIN 5 mcg/min (03/12/19 2300)   PRN Meds: sodium chloride, acetaminophen, ALPRAZolam, nitroGLYCERIN, ondansetron (ZOFRAN) IV, sodium chloride flush   Vital Signs    Vitals:   03/14/19 0400 03/14/19 0600 03/14/19 0610 03/14/19 0819  BP:   107/65   Pulse:  76    Resp:  19 (!) 22   Temp: 98.5 F (36.9 C)  97.7 F (36.5 C) 98.3 F (36.8 C)  TempSrc: Oral  Oral Oral  SpO2:  96%    Weight:      Height:        Intake/Output Summary (Last 24 hours) at 03/14/2019 0925 Last data filed at 03/14/2019 0600 Gross per 24 hour  Intake 762.02 ml  Output 1200 ml  Net -437.98 ml    I/O since admission: -1622  Filed Weights   03/12/19 1831 03/12/19 2250  Weight: 122.5 kg 127.3 kg    Telemetry    Sinus in the 80s - Personally Reviewed  ECG    Will repeat today  Post procedure ECG 03/12/2019: ( independently reviewed);  NSR 82 with resolution of STE and inferior ST depression  03/12/2019 ECG (independently read by me): NSR 96; STE V1-3, aVL, ST depression inferiorly  Physical Exam    BP 107/65   Pulse 76   Temp 98.3 F (36.8 C) (Oral)   Resp (!) 22   Ht 5\' 11"  (1.803 m)   Wt 127.3 kg   SpO2  96%   BMI 39.14 kg/m  General: Alert, oriented, no distress.  Skin: normal turgor, no rashes, warm and dry HEENT: Normocephalic, atraumatic. Pupils equal round and reactive to light; sclera anicteric; extraocular muscles intact;  Nose without nasal septal hypertrophy Mouth/Parynx benign; Mallinpatti scale 34 Neck: Ver thick neck; No JVD, no carotid bruits; normal carotid upstroke Lungs: clear to ausculatation and percussion; no wheezing or rales Chest wall: without tenderness to palpitation Heart: PMI not displaced, RRR, s1 s2 normal, 1/6 systolic murmur, no diastolic murmur, no rubs, gallops, thrills, or heaves Abdomen: soft, nontender; no hepatosplenomehaly, BS+; abdominal aorta nontender and not dilated by palpation. Back: no CVA tenderness Pulses 2+ : R radial site stable Musculoskeletal: full range of motion, normal strength, no joint deformities Extremities: no clubbing cyanosis or edema, Homan's sign negative  Neurologic: grossly nonfocal; Cranial nerves grossly wnl Psychologic: Normal mood and affect   Labs    Chemistry Recent Labs  Lab 03/12/19 1830 03/13/19 0547  NA 141 139  K 3.4* 3.7  CL 105 102  CO2 24 26  GLUCOSE 133* 247*  BUN 15 13  CREATININE 1.25* 1.10  CALCIUM 9.9 9.3  GFRNONAA >60 >60  GFRAA >60 >60  ANIONGAP 12 11     Hematology Recent Labs  Lab 03/12/19 1830 03/13/19 0547 03/14/19 0301  WBC 22.8* 16.4* 14.4*  RBC 5.26 5.01 5.24  HGB 15.5 14.9 15.7  HCT 46.3 43.9 46.1  MCV 88.0 87.6 88.0  MCH 29.5 29.7 30.0  MCHC 33.5 33.9 34.1  RDW 12.9 13.2 13.1  PLT 303 231 190    Cardiac Enzymes Recent Labs  Lab 03/12/19 2323 03/13/19 0547 03/13/19 1152 03/13/19 1832  TROPONINI 5.52* 14.52* 16.14* 16.86*    Recent Labs  Lab 03/12/19 1842  TROPIPOC 0.01     BNPNo results for input(s): BNP, PROBNP in the last 168 hours.   DDimer No results for input(s): DDIMER in the last 168 hours.   Lipid Panel     Component Value Date/Time    CHOL 124 03/13/2019 0547   TRIG 234 (H) 03/13/2019 0547   HDL 20 (L) 03/13/2019 0547   CHOLHDL 6.2 03/13/2019 0547   VLDL 47 (H) 03/13/2019 0547   LDLCALC 57 03/13/2019 0547   LDLCALC 63 11/19/2018 0822     Radiology    Dg Chest Portable 1 View  Result Date: 03/12/2019 CLINICAL DATA:  43 year old male with history of ST-elevation myocardial infarction. EXAM: PORTABLE CHEST 1 VIEW COMPARISON:  Chest x-ray 02/27/2018. FINDINGS: Transcutaneous defibrillator pads project over the left hemithorax. Lung volumes are low. Patient is in a very lordotic position. Lung volumes are low. No consolidative airspace disease. No pleural effusions. No pneumothorax. No pulmonary nodule or mass noted. Pulmonary vasculature and the cardiomediastinal silhouette are within normal limits. IMPRESSION: 1. Low lung volumes without radiographic evidence of acute cardiopulmonary disease. Electronically Signed   By: Vinnie Langton M.D.   On: 03/12/2019 19:12    Cardiac Studies   Conclusions: 1. Severe two-vessel CAD with 90% stenosis of proximal LAD just beyond D1 with thrombos (some of which had embolized to the apical LAD) and 99% in-stent restenosis of OM1 and 90% stenosis of jailed mid LCx. 2. Mild in-stent restenosis involving the mid RCA. 3. Moderately elevated left ventricular filling pressure with suggestion of anterior hypokinesis on the left ventriculogram (suboptimal opacification with hand-injection). 4. Successful PCI to proximal LAD using Synergy 3.0 x 2 mm DES (post-dilated proximally to 4.0 mm) with 0% residual stenosis and TIMI-3 flow. 5. Success balloon angioplasty to LCx/OM1 stent using  Euphora 2.25 x 8 mm and Sapphire 2.5 x 12 mm balloons with reduction in stenosis of OM1 from 99% to 40% and mid LCx from 90% to 60% and restoration of TIMI-3 flow.  Given improving chest pain, relatively small vessel size with need for complex bifurcation intervention, and radiation/contrast doses administered to that  point, decision was made to forego additional intervention to this vessel.  Recommendations: 1. Continue tirofiban infusion x 18 hours. 2. Dual antiplatelet therapy with aspirin and ticagrelor for at least 12 months, ideally longer. 3. Aggressive secondary prevention. 4. Obtain transthoracic echocardiogram to assess LV function. 5. If patient has refractory chest pain, complex bifurcation intervention to previously stented LCx/OM1 and AV groove LCx could be considered, though I worry about restenosis in the future given aggressive restenosis since prior PCI in 2018, relatively small vessel size, and likely need for culotte stenting of the LCx.    Intervention     ECHO 03/13/2019 IMPRESSIONS  1. Severe akinesis of the left ventricular, apical anteroseptal wall.  2. The left ventricle has mild-moderately reduced systolic function,  with an ejection fraction of 40-45%. The cavity size was normal. Left ventricular diastolic Doppler parameters are consistent with pseudonormalization.  3. The right ventricle has normal systolic function. The cavity was normal. There is no increase in right ventricular wall thickness.  4. No stenosis of the aortic valve.  5. The aortic root and ascending aorta are normal in size and structure.  6. The interatrial septum was not assessed.  Patient Profile     Lance [name] is a 43 y.o. male with history of CAD s/p PCI to LCx/OM and RCA in 2018, hypertension, hyperlipidemia, type 2 diabetes mellitus, and obesity, presenting from [facility] with chest pain, ventricular fibrillation, and EKG concerning for anterolateral STEMI.  Assessment & Plan    1. VF arrest: due to STEMI with successful defibrillation;  No recurrent arrythmia will dc amiodarone.  Will continue with carvedilol  2. CAD: s/p LAD DES stent with distal emboli Rx with transient aggrastat, PTCA om restenosed OM; reviewed angios will try to treat medically.  3. Ischemic cardiomyopathy: EF 40 - 45% on  echo.  Pt had initially refused 6.25 mg carvedilol, but long discussion wiuth pateient and he agrees to allow titration as tolerates.  4. Mixed hyperlipidemia: now on atorvastatin. Lovaza was started, would transition to vascepa as outpatient based on "reduce-it" trial data.  5. Type 2 DM : needs better control; HbA1c 7.7  6. Jena GaussHugh liklihood for OSA: loud snorin, very thick neck, Mallinpatti score 3/4, cannot slee on back;  Needs a sleep study as outpatient.  7. Tobacco abuse: discussed imperative cessation  8. Obesity: weight loss is essential.  9. HypoK: KCl 40 meq ordered; check Mg.   Time spent: 35 minutes Signed, Lennette Biharihomas A. Katelynd Blauvelt, MD, [facility] 03/14/2019, 9:25 AM

## 2019-03-15 LAB — GLUCOSE, CAPILLARY
Glucose-Capillary: 122 mg/dL — ABNORMAL HIGH (ref 70–99)
Glucose-Capillary: 146 mg/dL — ABNORMAL HIGH (ref 70–99)
Glucose-Capillary: 147 mg/dL — ABNORMAL HIGH (ref 70–99)
Glucose-Capillary: 111 mg/dL — ABNORMAL HIGH (ref 70–99)

## 2019-03-15 LAB — MRSA PCR SCREENING: MRSA by PCR: NEGATIVE

## 2019-03-15 MED ORDER — PHENYLEPHRINE HCL-NACL 10-0.9 MG/250ML-% IV SOLN
INTRAVENOUS | Status: AC
  Filled 2019-03-15: qty 500

## 2019-03-15 MED ORDER — DOCUSATE SODIUM 100 MG PO CAPS: 100.0000 mg | ORAL_CAPSULE | Freq: Every day | ORAL | Status: DC | PRN

## 2019-03-15 NOTE — Progress Notes (Signed)
CARDIAC REHAB PHASE I   PRE:  Rate/Rhythm: 81 SR  BP:  Supine:   Sitting: 116/77  Standing:    SaO2: 96%RA  MODE:  Ambulation: 740 ft   POST:  Rate/Rhythm: 93 SR  BP:  Supine:   Sitting: 149/88  Standing:    SaO2: 96%RA 0850-0940 Pt walked 740 ft on RA with steady gait and tolerated well. No CP. Reviewed importance of brilinta with stent. Discussed NTG use, MI restrictions, ex ed, smoking cessation, carb counting and heart healthy food choices, CRP 2. Pt stated he has quit for good now. Gave smoking cessation handout. Pt stated he has made improvements in his diet. Referred to CRP 2 [place]. Not interested in virtual ex app. Has treadmill at home. Encouraged to walk for ex.   Graylon Good, RN BSN  03/15/2019 9:35 AM

## 2019-03-15 NOTE — Progress Notes (Signed)
Progress Note  Patient Name: Lance [name] Date of Encounter: 03/15/2019  Primary Cardiologist: Dr. Domenic Polite  Subjective   No recurrent chest pain or recurrent ventricular arrythmia; walking with cardiac rehab  Inpatient Medications    Scheduled Meds: . aspirin EC  81 mg Oral Daily  . atorvastatin  80 mg Oral QHS  . carvedilol  6.25 mg Oral BID WC  . Chlorhexidine Gluconate Cloth  6 each Topical Q0600  . enoxaparin (LOVENOX) injection  40 mg Subcutaneous Q24H  . insulin aspart  0-15 Units Subcutaneous TID WC  . insulin glargine  90 Units Subcutaneous QHS  . losartan  100 mg Oral Daily  . omega-3 acid ethyl esters  1,000 mg Oral BID  . sodium chloride flush  3 mL Intravenous Once  . sodium chloride flush  3 mL Intravenous Q12H  . ticagrelor  90 mg Oral BID   Continuous Infusions: . sodium chloride    . nitroGLYCERIN 5 mcg/min (03/12/19 2300)   PRN Meds: sodium chloride, acetaminophen, ALPRAZolam, nitroGLYCERIN, ondansetron (ZOFRAN) IV, sodium chloride flush   Vital Signs    Vitals:   03/15/19 0500 03/15/19 0600 03/15/19 0648 03/15/19 0806  BP:    116/77  Pulse: 71 71  82  Resp: 17 18    Temp:   97.8 F (36.6 C)   TempSrc:   Oral   SpO2: 93% 93%    Weight:      Height:        Intake/Output Summary (Last 24 hours) at 03/15/2019 0855 Last data filed at 03/14/2019 2000 Gross per 24 hour  Intake 680 ml  Output 375 ml  Net 305 ml    I/O since admission: -1622  Filed Weights   03/12/19 1831 03/12/19 2250  Weight: 122.5 kg 127.3 kg    Telemetry    Sinus in the 80s - Personally Reviewed  ECG    ECG ordered but not yet done  Post procedure ECG 03/12/2019: ( independently reviewed);  NSR 82 with resolution of STE and inferior ST depression  03/12/2019 ECG (independently read by me): NSR 96; STE V1-3, aVL, ST depression inferiorly  Physical Exam    BP 116/77   Pulse 82   Temp 97.8 F (36.6 C) (Oral)   Resp 18   Ht 5\' 11"  (1.803 m)   Wt 127.3 kg    SpO2 93%   BMI 39.14 kg/m  General: Alert, oriented, no distress.  Skin: normal turgor, no rashes, warm and dry HEENT: Normocephalic, atraumatic. Pupils equal round and reactive to light; sclera anicteric; extraocular muscles intact;  Nose without nasal septal hypertrophy Mouth/Parynx benign; Mallinpatti scale 3/4 Neck:Very thick neck;  No JVD, no carotid bruits; normal carotid upstroke Lungs: clear to ausculatation and percussion; no wheezing or rales Chest wall: without tenderness to palpitation Heart: PMI not displaced, RRR, s1 s2 normal, 1/6 systolic murmur, no diastolic murmur, no rubs, gallops, thrills, or heaves Abdomen: soft, nontender; no hepatosplenomehaly, BS+; abdominal aorta nontender and not dilated by palpation. Back: no CVA tenderness Pulses 2+ Musculoskeletal: full range of motion, normal strength, no joint deformities Extremities: no clubbing cyanosis or edema, Homan's sign negative  Neurologic: grossly nonfocal; Cranial nerves grossly wnl Psychologic: Normal mood and affect   Labs    Chemistry Recent Labs  Lab 03/12/19 1830 03/13/19 0547 03/14/19 1051  NA 141 139 135  K 3.4* 3.7 4.0  CL 105 102 102  CO2 24 26 24   GLUCOSE 133* 247* 152*  BUN 15 13  14  CREATININE 1.25* 1.10 1.01  CALCIUM 9.9 9.3 9.0  GFRNONAA >60 >60 >60  GFRAA >60 >60 >60  ANIONGAP 12 11 9      Hematology Recent Labs  Lab 03/12/19 1830 03/13/19 0547 03/14/19 0301  WBC 22.8* 16.4* 14.4*  RBC 5.26 5.01 5.24  HGB 15.5 14.9 15.7  HCT 46.3 43.9 46.1  MCV 88.0 87.6 88.0  MCH 29.5 29.7 30.0  MCHC 33.5 33.9 34.1  RDW 12.9 13.2 13.1  PLT 303 231 190    Cardiac Enzymes Recent Labs  Lab 03/12/19 2323 03/13/19 0547 03/13/19 1152 03/13/19 1832  TROPONINI 5.52* 14.52* 16.14* 16.86*    Recent Labs  Lab 03/12/19 1842  TROPIPOC 0.01     BNPNo results for input(s): BNP, PROBNP in the last 168 hours.   DDimer No results for input(s): DDIMER in the last 168 hours.   Lipid  Panel     Component Value Date/Time   CHOL 124 03/13/2019 0547   TRIG 234 (H) 03/13/2019 0547   HDL 20 (L) 03/13/2019 0547   CHOLHDL 6.2 03/13/2019 0547   VLDL 47 (H) 03/13/2019 0547   LDLCALC 57 03/13/2019 0547   LDLCALC 63 11/19/2018 0822     Radiology    No results found.  Cardiac Studies   Conclusions: 1. Severe two-vessel CAD with 90% stenosis of proximal LAD just beyond D1 with thrombos (some of which had embolized to the apical LAD) and 99% in-stent restenosis of OM1 and 90% stenosis of jailed mid LCx. 2. Mild in-stent restenosis involving the mid RCA. 3. Moderately elevated left ventricular filling pressure with suggestion of anterior hypokinesis on the left ventriculogram (suboptimal opacification with hand-injection). 4. Successful PCI to proximal LAD using Synergy 3.0 x 2 mm DES (post-dilated proximally to 4.0 mm) with 0% residual stenosis and TIMI-3 flow. 5. Success balloon angioplasty to LCx/OM1 stent using [place] Euphora 2.25 x 8 mm and Sapphire 2.5 x 12 mm balloons with reduction in stenosis of OM1 from 99% to 40% and mid LCx from 90% to 60% and restoration of TIMI-3 flow.  Given improving chest pain, relatively small vessel size with need for complex bifurcation intervention, and radiation/contrast doses administered to that point, decision was made to forego additional intervention to this vessel.  Recommendations: 1. Continue tirofiban infusion x 18 hours. 2. Dual antiplatelet therapy with aspirin and ticagrelor for at least 12 months, ideally longer. 3. Aggressive secondary prevention. 4. Obtain transthoracic echocardiogram to assess LV function. 5. If patient has refractory chest pain, complex bifurcation intervention to previously stented LCx/OM1 and AV groove LCx could be considered, though I worry about restenosis in the future given aggressive restenosis since prior PCI in 2018, relatively small vessel size, and likely need for culotte stenting of the LCx.     Intervention     ECHO 03/13/2019 IMPRESSIONS  1. Severe akinesis of the left ventricular, apical anteroseptal wall.  2. The left ventricle has mild-moderately reduced systolic function, with an ejection fraction of 40-45%. The cavity size was normal. Left ventricular diastolic Doppler parameters are consistent with pseudonormalization.  3. The right ventricle has normal systolic function. The cavity was normal. There is no increase in right ventricular wall thickness.  4. No stenosis of the aortic valve.  5. The aortic root and ascending aorta are normal in size and structure.  6. The interatrial septum was not assessed.  Patient Profile     Lance [name] is a 43 y.o. male with history of CAD s/p PCI  to LCx/OM and RCA in 2018, hypertension, hyperlipidemia, type 2 diabetes mellitus, and obesity, [facility] with chest pain, ventricular fibrillation, and EKG concerning for anterolateral STEMI.  Assessment & Plan    1. VF arrest 6/7 due to STEMI with successful defibrillation;  No recurrent arrythmia now off amiodarone.  Will continue with carvedilol and titrate accordingly.  2. CAD: s/p LAD DES stent with distal emboli Rx with transient aggrastat, PTCA om restenosed OM; reviewed angios will try to treat medically.  3. Ischemic cardiomyopathy: EF 40 - 45% on echo. Tolerating the 6.25 mg bid dose. Will not increase today but consider increaseing to 12.5 mg bid tomorrow.   4. Mixed hyperlipidemia: now on atorvastatin. Lovaza was started, would transition to vascepa as outpatient based on "reduce-it" trial data.  5. Type 2 DM : needs better control; HbA1c 7.7  6. High liklihood for OSA: loud snoring, very thick neck, Mallinpatti score 3/4, cannot slee on back;  Needs a sleep study as outpatient. I have spoken with Claiborne RiggWanda Waddell our sleep coordinator to schedule as outpatient.   7. Tobacco abuse: discussed imperative cessation;  Reiterated again today.  8. Obesity: weight loss  is essential.  9. HypoK: supplemented; stable  Cardiac Rehab;  Will transfer to telemetry today.  Signed, Lennette Biharihomas A. Caterina Racine, MD, [facility] 03/15/2019, 8:55 AM

## 2019-03-16 ENCOUNTER — Telehealth: Payer: Self-pay | Admitting: Cardiology

## 2019-03-16 LAB — BASIC METABOLIC PANEL
Anion gap: 7 (ref 5–15)
BUN: 14 mg/dL (ref 6–20)
CO2: 25 mmol/L (ref 22–32)
Calcium: 9 mg/dL (ref 8.9–10.3)
Chloride: 105 mmol/L (ref 98–111)
Creatinine, Ser: 0.99 mg/dL (ref 0.61–1.24)
GFR calc Af Amer: >60 mL/min (ref >60)
GFR calc non Af Amer: >60 mL/min (ref >60)
Glucose, Bld: 131 mg/dL — ABNORMAL HIGH (ref 70–99)
Potassium: 3.9 mmol/L (ref 3.5–5.1)
Sodium: 137 mmol/L (ref 135–145)

## 2019-03-16 LAB — GLUCOSE, CAPILLARY
Glucose-Capillary: 86 mg/dL (ref 70–99)
Glucose-Capillary: 107 mg/dL — ABNORMAL HIGH (ref 70–99)

## 2019-03-16 MED ORDER — CARVEDILOL 6.25 MG PO TABS: 6.2500 mg | ORAL_TABLET | Freq: Two times a day (BID) | ORAL | 1 refills | Status: DC

## 2019-03-16 MED ORDER — LOSARTAN POTASSIUM 100 MG PO TABS: 100.0000 mg | ORAL_TABLET | Freq: Every day | ORAL | 1 refills | Status: DC

## 2019-03-16 MED ORDER — TICAGRELOR 90 MG PO TABS: 90.0000 mg | ORAL_TABLET | Freq: Two times a day (BID) | ORAL | 2 refills | Status: DC

## 2019-03-16 MED ORDER — NITROGLYCERIN 0.4 MG SL SUBL: 0.4000 mg | SUBLINGUAL_TABLET | SUBLINGUAL | 1 refills | Status: DC | PRN

## 2019-03-16 MED FILL — CARVEDILOL 6.25 MG TABLET: 6.25 | 30 days supply | Qty: 60 | Fill #0

## 2019-03-16 MED FILL — NITROGLYCERIN 0.4 MG TAB SL: 0.4 | 8 days supply | Qty: 25 | Fill #0

## 2019-03-16 MED FILL — BRILINTA 90 MG TABLET: 90 | 30 days supply | Qty: 60 | Fill #0

## 2019-03-16 MED FILL — LOSARTAN POTASSIUM 100 MG T: 100 | 30 days supply | Qty: 30 | Fill #0

## 2019-03-16 NOTE — Telephone Encounter (Signed)
TOC patient - d/c [facility] 03-16-2019

## 2019-03-16 NOTE — TOC Transition Note (Signed)
Transition of Care [facility]) - CM/SW Discharge Note   Patient Details  Name: CHAUNCY MANGIARACINA MRN: [id number] Date of Birth: March 11, 1976  Transition of Care [facility]) CM/SW Contact:  Midge Minium RN, BSN, NCM-BC, ACM-RN [phone] Phone Number: 03/16/2019, 1:12 PM   Clinical Narrative:    CM following for dispositional needs. Patient s/p cath with PCI of his proximal and mid left circumflex artery/OM1. Patient lives at home with his spouse; independent with his ADLs PTA. Benefits check complete, with Brilinta est copay $85; CM discussed with patient via phone, with patient verbalizing understanding. CM sent patient a Brilinta copay savings card, with use explained. [facility] will provide the patient with a 30-day free supply of Brilinta at discharge. No further needs from CM.   Final next level of care: Home/Self Care Barriers to Discharge: No Barriers Identified   Patient Goals and CMS Choice     Choice offered to / list presented to : NA   Discharge Plan and Services In-house Referral: NA Discharge Planning Services: CM Consult, Medication Assistance(Brilinta copay savings card) Post Acute Care Choice: NA          DME Arranged: N/A DME Agency: NA       HH Arranged: NA HH Agency: NA        Social Determinants of Health (SDOH) Interventions     Readmission Risk Interventions No flowsheet data found.

## 2019-03-16 NOTE — Discharge Summary (Signed)
Discharge Summary    Patient ID: Lance [name],  MRN: [id number], DOB/AGE: 43/06/1976 43 y.o.  Admit date: 03/12/2019 Discharge date: 03/16/2019  Primary Care Provider: [facility], [facility] Primary Cardiologist: Lance [name]   Discharge Diagnoses    Principal Problem:   STEMI (ST elevation myocardial infarction) [facility](HCC) Active Problems:   Essential hypertension, benign   Mixed hyperlipidemia   Current smoker   STEMI involving left anterior descending coronary artery (HCC)   Acute ST elevation myocardial infarction (STEMI) involving left main coronary artery (HCC)   VF (ventricular fibrillation) (HCC)   Loud snoring   Allergies Allergies  Allergen Reactions   Bee Venom Anaphylaxis and Swelling   Penicillins Hives, Itching and Rash    Has patient had a PCN reaction causing immediate rash, facial/tongue/throat swelling, SOB or lightheadedness with hypotension: No Has patient had a PCN reaction causing severe rash involving mucus membranes or skin necrosis: No Has patient had a PCN reaction that required hospitalization: No Has patient had a PCN reaction occurring within the last 10 years: No If all of the above answers are "NO", then may proceed with Cephalosporin use.     Diagnostic Studies/Procedures    Cath: 03/12/19  Conclusions: 1. Severe two-vessel CAD with 90% stenosis of proximal LAD just beyond D1 with thrombos (some of which had embolized to the apical LAD) and 99% in-stent restenosis of OM1 and 90% stenosis of jailed mid LCx. 2. Mild in-stent restenosis involving the mid RCA. 3. Moderately elevated left ventricular filling pressure with suggestion of anterior hypokinesis on the left ventriculogram (suboptimal opacification with hand-injection). 4. Successful PCI to proximal LAD using Synergy 3.0 x 2 mm DES (post-dilated proximally to 4.0 mm) with 0% residual stenosis and TIMI-3 flow. 5. Success balloon angioplasty to LCx/OM1 stent using [place] Euphora 2.25 x 8  mm and Sapphire 2.5 x 12 mm balloons with reduction in stenosis of OM1 from 99% to 40% and mid LCx from 90% to 60% and restoration of TIMI-3 flow.  Given improving chest pain, relatively small vessel size with need for complex bifurcation intervention, and radiation/contrast doses administered to that point, decision was made to forego additional intervention to this vessel.  Recommendations: 1. Continue tirofiban infusion x 18 hours. 2. Dual antiplatelet therapy with aspirin and ticagrelor for at least 12 months, ideally longer. 3. Aggressive secondary prevention. 4. Obtain transthoracic echocardiogram to assess LV function. 5. If patient has refractory chest pain, complex bifurcation intervention to previously stented LCx/OM1 and AV groove LCx could be considered, though I worry about restenosis in the future given aggressive restenosis since prior PCI in 2018, relatively small vessel size, and likely need for culotte stenting of the LCx.  Lance Kendallhristopher End, MD [facility]  Diagnostic  Dominance: Right    Intervention     _____________   History of Present Illness     [name] is a 43 yo male with PMH of CAD s/p PCI to LCx/OM and RCA in 2018, hypertension, hyperlipidemia, type 2 diabetes mellitus, and obesity. 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  He had acute onset of substernal chest pain without radiation that did not improve with sublingual NTG.  [facility] and had ventricular fibrillation arrest shortly after arrival.  He as successfully defibrillated with ROSC.  [facility], he continued to have mild  right-sided chest pain.  He denied shortness of breath, palpitations, and lightheadedness.  He had been compliant with his medications.  However, he noted over the last several months, he had a few episodes of brief  substernal chest pain when walking. These typically resolve promptly with rest.  He was taken directly to the cath lab for emergent cardiac cath.   Hospital Course     1. VF arrest 6/6 due to STEMI with successful defibrillation: Initially required the use of IV amiodarone but able to be weaned. No recurrent arrythmia now off amiodarone.  Tolerated carvedilol 6.25 mg bid. Consider titration as an outpatient.  2. CAD: s/p LAD DES stent with distal emboli Rx with transient aggrastat, PTCA on restenosed OM. Other disease noted above in cath report to be treated medically. Plan for DAPT with ASA/Brilinta for at least one year.  3. Ischemic cardiomyopathy: EF 40 - 45% on echo. Tolerating the 6.25 mg bid dose. Also on losartan which was titrated to 100mg  daily. No volume overload.   4. Mixed hyperlipidemia: now on atorvastatin. LDL 57. Consider adding vascepa as outpatient based on "reduce-it" trial data.  5. Type 2 DM : needs better control; HbA1c 7.7. Restarted on metformin at discharge. Instructed to follow up with PCP regarding further management.   6. High concern for OSA: loud snoring, very thick neck, Mallinpatti score 3/4, cannot sleep on back;  Needs a sleep study as outpatient. Lance [name] spoke with Lance [name] our sleep coordinator to schedule as outpatient.   7. Tobacco abuse: discussed imperative cessation  8. Obesity: weight loss is essential and stressed this admission.  Lance [name] was seen by Lance [name] and determined stable for discharge home. Follow up in the office has been arranged. Medications are listed below.   _____________  Discharge Vitals Blood pressure 130/86, pulse 74, temperature 98.2 F (36.8 C), temperature source Oral, resp. rate 15, height 5\' 11"  (1.803 m), weight 125.5 kg, SpO2 93 %.  Filed Weights   03/12/19 1831 03/12/19 2250 03/16/19 0525  Weight: 122.5 kg 127.3 kg 125.5 kg    Labs & Radiologic Studies    CBC Recent Labs     03/14/19 0301  WBC 14.4*  HGB 15.7  HCT 46.1  MCV 88.0  PLT 623   Basic Metabolic Panel Recent Labs    03/14/19 1051 03/16/19 0316  NA 135 137  K 4.0 3.9  CL 102 105  CO2 24 25  GLUCOSE 152* 131*  BUN 14 14  CREATININE 1.01 0.99  CALCIUM 9.0 9.0  MG 2.0  --    Liver Function Tests No results for input(s): AST, ALT, ALKPHOS, BILITOT, PROT, ALBUMIN in the last 72 hours. No results for input(s): LIPASE, AMYLASE in the last 72 hours. Cardiac Enzymes Recent Labs    03/13/19 1832  TROPONINI 16.86*   BNP Invalid input(s): POCBNP D-Dimer No results for input(s): DDIMER in the last 72 hours. Hemoglobin A1C No results for input(s): HGBA1C in the last 72 hours. Fasting Lipid Panel No results for input(s): CHOL, HDL, LDLCALC, TRIG, CHOLHDL, LDLDIRECT in the last 72 hours. Thyroid Function Tests No results for input(s): TSH, T4TOTAL, T3FREE, THYROIDAB in the last 72 hours.  Invalid input(s): FREET3 _____________  Dg Chest Portable 1 View  Result Date: 03/12/2019 CLINICAL DATA:  43 year old male with history of ST-elevation myocardial infarction. EXAM: PORTABLE CHEST 1 VIEW COMPARISON:  Chest x-ray 02/27/2018. FINDINGS: Transcutaneous defibrillator pads project over the left hemithorax. Lung volumes are low. Patient is  in a very lordotic position. Lung volumes are low. No consolidative airspace disease. No pleural effusions. No pneumothorax. No pulmonary nodule or mass noted. Pulmonary vasculature and the cardiomediastinal silhouette are within normal limits. IMPRESSION: 1. Low lung volumes without radiographic evidence of acute cardiopulmonary disease. Electronically Signed   By: Trudie Reedaniel  Entrikin M.D.   On: 03/12/2019 19:12   Disposition   Pt is being discharged home today in good condition.  Follow-up Plans & Appointments    Follow-up Information    Barrett, Joline SaltRhonda G, PA-C Follow up on 03/29/2019.   Specialties:  Cardiology, Radiology Why:  at 2:30pm for your follow up  appt.  Contact information: [address] [place] [id number] [phone]          Discharge Instructions    Amb Referral to Cardiac Rehabilitation   Complete by:  As directed    Pt not interested in Virtual ex program   Diagnosis:   STEMI Coronary Stents     After initial evaluation and assessments completed: Virtual Based Care may be provided alone or in conjunction with Phase 2 Cardiac Rehab based on patient barriers.:  Yes   Call MD for:  redness, tenderness, or signs of infection (pain, swelling, redness, odor or green/yellow discharge around incision site)   Complete by:  As directed    Diet - low sodium heart healthy   Complete by:  As directed    Discharge instructions   Complete by:  As directed    Radial Site Care Refer to this sheet in the next few weeks. These instructions provide you with information on caring for yourself after your procedure. Your caregiver may also give you more specific instructions. Your treatment has been planned according to current medical practices, but problems sometimes occur. Call your caregiver if you have any problems or questions after your procedure. HOME CARE INSTRUCTIONS You may shower the day after the procedure.Remove the bandage (dressing) and gently wash the site with plain soap and water.Gently pat the site dry.  Do not apply powder or lotion to the site.  Do not submerge the affected site in water for 3 to 5 days.  Inspect the site at least twice daily.  Do not flex or bend the affected arm for 24 hours.  No lifting over 5 pounds (2.3 kg) for 5 days after your procedure.  Do not drive home if you are discharged the same day of the procedure. Have someone else drive you.  You may drive 24 hours after the procedure unless otherwise instructed by your caregiver.  What to expect: Any bruising will usually fade within 1 to 2 weeks.  Blood that collects in the tissue (hematoma) may be painful to the touch. It should  usually decrease in size and tenderness within 1 to 2 weeks.  SEEK IMMEDIATE MEDICAL CARE IF: You have unusual pain at the radial site.  You have redness, warmth, swelling, or pain at the radial site.  You have drainage (other than a small amount of blood on the dressing).  You have chills.  You have a fever or persistent symptoms for more than 72 hours.  You have a fever and your symptoms suddenly get worse.  Your arm becomes pale, cool, tingly, or numb.  You have heavy bleeding from the site. Hold pressure on the site.   PLEASE DO NOT MISS ANY DOSES OF YOUR BRILINTA!!!!! Also keep a log of you blood pressures and bring back to your follow up appt.  Please call the office with any questions.   Patients taking blood thinners should generally stay away from medicines like ibuprofen, Advil, Motrin, naproxen, and Aleve due to risk of stomach bleeding. You may take Tylenol as directed or talk to your primary doctor about alternatives.   Increase activity slowly   Complete by:  As directed        Discharge Medications     Medication List    STOP taking these medications   lisinopril 40 MG tablet Commonly known as:  ZESTRIL     TAKE these medications   ALPRAZolam 1 MG tablet Commonly known as:  XANAX Take 1 mg by mouth daily as needed for anxiety.   aspirin EC 81 MG tablet Take 81 mg by mouth daily. Notes to patient:  DUE TOMORROW   atorvastatin 80 MG tablet Commonly known as:  Lipitor Take 1 tablet (80 mg total) by mouth daily. What changed:  when to take this Notes to patient:  DUE THIS EVENING   carvedilol 6.25 MG tablet Commonly known as:  COREG Take 1 tablet (6.25 mg total) by mouth 2 (two) times daily with a meal. What changed:    medication strength  how much to take  when to take this Notes to patient:  DUE THIS EVENING   Insulin Lispro 200 UNIT/ML Sopn Commonly known as:  HumaLOG KwikPen Inject 15-21 Units into the skin 3 (three) times daily before  meals.   losartan 100 MG tablet Commonly known as:  COZAAR Take 1 tablet (100 mg total) by mouth daily. Start taking on:  March 17, 2019 Notes to patient:  DUE TOMORROW   metFORMIN 500 MG 24 hr tablet Commonly known as:  GLUCOPHAGE-XR Take 1 tablet (500 mg total) by mouth 2 (two) times daily. Notes to patient:  DUE THIS EVENING   nitroGLYCERIN 0.4 MG SL tablet Commonly known as:  NITROSTAT Place 1 tablet (0.4 mg total) under the tongue every 5 (five) minutes x 3 doses as needed for chest pain.   Omega-3 1000 MG Caps Take 1,000 mg by mouth 2 (two) times daily. Notes to patient:  DUE THIS EVENING   Pen Needles 31G X 8 MM Misc 1 each by Does not apply route 4 (four) times daily.   ticagrelor 90 MG Tabs tablet Commonly known as:  BRILINTA Take 1 tablet (90 mg total) by mouth 2 (two) times daily. Notes to patient:  DUE THIS EVENING   Tresiba FlexTouch 200 UNIT/ML Sopn Generic drug:  Insulin Degludec INJECT 90 UNITS INTO THE SKIN AT BEDTIME What changed:  See the new instructions.        Acute coronary syndrome (MI, NSTEMI, STEMI, etc) this admission?: Yes.     AHA/ACC Clinical Performance & Quality Measures: 6. Aspirin prescribed? - Yes 7. ADP Receptor Inhibitor (Plavix/Clopidogrel, Brilinta/Ticagrelor or Effient/Prasugrel) prescribed (includes medically managed patients)? - Yes 8. Beta Blocker prescribed? - Yes 9. High Intensity Statin (Lipitor 40-80mg  or Crestor 20-40mg ) prescribed? - Yes 10. EF assessed during THIS hospitalization? - Yes 11. For EF <40%, was ACEI/ARB prescribed? - Yes 12. For EF <40%, Aldosterone Antagonist (Spironolactone or Eplerenone) prescribed? - Not Applicable (EF >/= 40%) 13. Cardiac Rehab Phase II ordered (Included Medically managed Patients)? - Yes      Outstanding Labs/Studies   FLP/LFTs in 6 weeks.   Duration of Discharge Encounter   Greater than 30 minutes including physician time.  Signed, Laverda PageLindsay Zemira Zehring NP-C 03/16/2019, 2:17  PM

## 2019-03-16 NOTE — Progress Notes (Signed)
CARDIAC REHAB PHASE I   PRE:  Rate/Rhythm: 73 SR  BP:  Supine: 132/81  Sitting:   Standing:    SaO2: 96%RA  MODE:  Ambulation: 740 ft   POST:  Rate/Rhythm: 85 SR  BP:  Supine:   Sitting: 130/86  Standing:    SaO2: 98%RA 1013-1102 Pt walked 740 ft on RA with steady gait. No CP. Tolerated well.    Graylon Good, RN BSN  03/16/2019 11:15 AM

## 2019-03-16 NOTE — Progress Notes (Signed)
Progress Note  Patient Name: Lance [name] Date of Encounter: 03/16/2019  Primary Cardiologist: Dr. Domenic Polite  Subjective   No recurrent chest pain or recurrent ventricular arrythmia; walking with cardiac rehab: feels well for DC today.  Inpatient Medications    Scheduled Meds: . aspirin EC  81 mg Oral Daily  . atorvastatin  80 mg Oral QHS  . carvedilol  6.25 mg Oral BID WC  . Chlorhexidine Gluconate Cloth  6 each Topical Q0600  . enoxaparin (LOVENOX) injection  40 mg Subcutaneous Q24H  . insulin aspart  0-15 Units Subcutaneous TID WC  . insulin glargine  90 Units Subcutaneous QHS  . losartan  100 mg Oral Daily  . omega-3 acid ethyl esters  1,000 mg Oral BID  . sodium chloride flush  3 mL Intravenous Once  . sodium chloride flush  3 mL Intravenous Q12H  . ticagrelor  90 mg Oral BID   Continuous Infusions: . sodium chloride    . nitroGLYCERIN 5 mcg/min (03/12/19 2300)   PRN Meds: sodium chloride, acetaminophen, ALPRAZolam, docusate sodium, nitroGLYCERIN, ondansetron (ZOFRAN) IV, sodium chloride flush   Vital Signs    Vitals:   03/16/19 0330 03/16/19 0525 03/16/19 0745 03/16/19 0823  BP: (!) 121/52  129/67   Pulse: 69  74   Resp: (!) 22  17   Temp: 97.8 F (36.6 C)   98.1 F (36.7 C)  TempSrc: Oral   Oral  SpO2: 95%  93%   Weight:  125.5 kg    Height:        Intake/Output Summary (Last 24 hours) at 03/16/2019 1040 Last data filed at 03/16/2019 0745 Gross per 24 hour  Intake 600 ml  Output -  Net 600 ml    I/O since admission: -[place]   03/12/19 1831 03/12/19 2250 03/16/19 0525  Weight: 122.5 kg 127.3 kg 125.5 kg    Telemetry    Sinus in the 80s - Personally Reviewed  ECG    03/15/2019 ECG (independently read by me): NSR 77, QS V1-3; [place]  Post procedure ECG 03/12/2019: ( independently reviewed);  NSR 82 with resolution of STE and inferior ST depression  03/12/2019 ECG (independently read by me): NSR 96; STE V1-3, aVL, ST depression  inferiorly  Physical Exam    BP 129/67   Pulse 74   Temp 98.1 F (36.7 C) (Oral)   Resp 17   Ht 5\' 11"  (1.803 m)   Wt 125.5 kg   SpO2 93%   BMI 38.59 kg/m  General: Alert, oriented, no distress.  Skin: normal turgor, no rashes, warm and dry HEENT: Normocephalic, atraumatic. Pupils equal round and reactive to light; sclera anicteric; extraocular muscles intact;  Nose without nasal septal hypertrophy Mouth/Parynx; Thick neck; Mallinpatti scale 3/4 Neck: No JVD, no carotid bruits; normal carotid upstroke Lungs: clear to ausculatation and percussion; no wheezing or rales Chest wall: without tenderness to palpitation Heart: PMI not displaced, RRR, s1 s2 normal, 1/6 systolic murmur, no diastolic murmur, no rubs, gallops, thrills, or heaves Abdomen: soft, nontender; no hepatosplenomehaly, BS+; abdominal aorta nontender and not dilated by palpation. Back: no CVA tenderness Pulses 2+ Musculoskeletal: full range of motion, normal strength, no joint deformities Extremities: no clubbing cyanosis or edema, Homan's sign negative  Neurologic: grossly nonfocal; Cranial nerves grossly wnl Psychologic: Normal mood and affect   Labs    Chemistry Recent Labs  Lab 03/13/19 0547 03/14/19 1051 03/16/19 0316  NA 139 135 137  K 3.7 4.0 3.9  CL 102 102 105  CO2 26 24 25   GLUCOSE 247* 152* 131*  BUN 13 14 14   CREATININE 1.10 1.01 0.99  CALCIUM 9.3 9.0 9.0  GFRNONAA >60 >60 >60  GFRAA >60 >60 >60  ANIONGAP 11 9 7      Hematology Recent Labs  Lab 03/12/19 1830 03/13/19 0547 03/14/19 0301  WBC 22.8* 16.4* 14.4*  RBC 5.26 5.01 5.24  HGB 15.5 14.9 15.7  HCT 46.3 43.9 46.1  MCV 88.0 87.6 88.0  MCH 29.5 29.7 30.0  MCHC 33.5 33.9 34.1  RDW 12.9 13.2 13.1  PLT 303 231 190    Cardiac Enzymes Recent Labs  Lab 03/12/19 2323 03/13/19 0547 03/13/19 1152 03/13/19 1832  TROPONINI 5.52* 14.52* 16.14* 16.86*    Recent Labs  Lab 03/12/19 1842  TROPIPOC 0.01     BNPNo results  for input(s): BNP, PROBNP in the last 168 hours.     DDimer No results for input(s): DDIMER in the last 168 hours.   Lipid Panel     Component Value Date/Time   CHOL 124 03/13/2019 0547   TRIG 234 (H) 03/13/2019 0547   HDL 20 (L) 03/13/2019 0547   CHOLHDL 6.2 03/13/2019 0547   VLDL 47 (H) 03/13/2019 0547   LDLCALC 57 03/13/2019 0547   LDLCALC 63 11/19/2018 0822     Radiology    No results found.  Cardiac Studies   Conclusions: 1. Severe two-vessel CAD with 90% stenosis of proximal LAD just beyond D1 with thrombos (some of which had embolized to the apical LAD) and 99% in-stent restenosis of OM1 and 90% stenosis of jailed mid LCx. 2. Mild in-stent restenosis involving the mid RCA. 3. Moderately elevated left ventricular filling pressure with suggestion of anterior hypokinesis on the left ventriculogram (suboptimal opacification with hand-injection). 4. Successful PCI to proximal LAD using Synergy 3.0 x 2 mm DES (post-dilated proximally to 4.0 mm) with 0% residual stenosis and TIMI-3 flow. 5. Success balloon angioplasty to LCx/OM1 stent using [place] Euphora 2.25 x 8 mm and Sapphire 2.5 x 12 mm balloons with reduction in stenosis of OM1 from 99% to 40% and mid LCx from 90% to 60% and restoration of TIMI-3 flow.  Given improving chest pain, relatively small vessel size with need for complex bifurcation intervention, and radiation/contrast doses administered to that point, decision was made to forego additional intervention to this vessel.  Recommendations: 1. Continue tirofiban infusion x 18 hours. 2. Dual antiplatelet therapy with aspirin and ticagrelor for at least 12 months, ideally longer. 3. Aggressive secondary prevention. 4. Obtain transthoracic echocardiogram to assess LV function. 5. If patient has refractory chest pain, complex bifurcation intervention to previously stented LCx/OM1 and AV groove LCx could be considered, though I worry about restenosis in the future given  aggressive restenosis since prior PCI in 2018, relatively small vessel size, and likely need for culotte stenting of the LCx.    Intervention     ECHO 03/13/2019 IMPRESSIONS  1. Severe akinesis of the left ventricular, apical anteroseptal wall.  2. The left ventricle has mild-moderately reduced systolic function, with an ejection fraction of 40-45%. The cavity size was normal. Left ventricular diastolic Doppler parameters are consistent with pseudonormalization.  3. The right ventricle has normal systolic function. The cavity was normal. There is no increase in right ventricular wall thickness.  4. No stenosis of the aortic valve.  5. The aortic root and ascending aorta are normal in size and structure.  6. The interatrial septum was not assessed.  Patient Profile     Lance [name] is a 43 y.o. male with history of CAD s/p PCI to LCx/OM and RCA in 2018, hypertension, hyperlipidemia, type 2 diabetes mellitus, and obesity, [facility] with chest pain, ventricular fibrillation, and EKG concerning for anterolateral STEMI.  Assessment & Plan    1. VF arrest 6/6 due to STEMI with successful defibrillation;  No recurrent arrythmia now off amiodarone.  Will continue with carvedilol now on 6.25 mg bid;  Probably can titrate to 12.5 mg with HR still in the 80s, and adequate BP.  2. CAD: s/p LAD DES stent with distal emboli Rx with transient aggrastat, PTCA om restenosed OM; reviewed angios will try to treat medically.  3. Ischemic cardiomyopathy: EF 40 - 45% on echo. Tolerating the 6.25 mg bid dose. Will not increase today but consider increaseing to 12.5 mg bid tomorrow.   4. Mixed hyperlipidemia: now on atorvastatin. Lovaza was started, would transition to vascepa as outpatient based on "reduce-it" trial data.  5. Type 2 DM : needs better control; HbA1c 7.7  6. High liklihood for OSA: loud snoring, very thick neck, Mallinpatti score 3/4, cannot slee on back;  Needs a sleep study  as outpatient. I have spoken with Claiborne RiggWanda Waddell our sleep coordinator to schedule as outpatient.   7. Tobacco abuse: discussed imperative cessation;  Reiterated again today.  8. Obesity: weight loss is essential.  9. HypoK: supplemented; stable  Ambulating well; no recurrent chest pain;  Plan DC today.  Signed, Lennette Biharihomas A. Cecilia Vancleve, MD, [facility] 03/16/2019, 10:40 AM

## 2019-03-17 ENCOUNTER — Other Ambulatory Visit: Payer: Self-pay | Admitting: Cardiovascular Disease

## 2019-03-17 ENCOUNTER — Telehealth: Payer: Self-pay | Admitting: *Deleted

## 2019-03-17 DIAGNOSIS — I10 Essential (primary) hypertension: Secondary | ICD-10-CM

## 2019-03-17 DIAGNOSIS — R0683 Snoring: Secondary | ICD-10-CM

## 2019-03-17 DIAGNOSIS — I2510 Atherosclerotic heart disease of native coronary artery without angina pectoris: Secondary | ICD-10-CM

## 2019-03-17 NOTE — Telephone Encounter (Signed)
PA for sleep study submitted to [employer] via web portal. Clinicals faxed to [phone]. 

## 2019-03-17 NOTE — Telephone Encounter (Signed)
-----   Message from Lauralee Evener, [place] sent at 03/15/2019  9:31 AM EDT ----- Spilt night

## 2019-03-18 NOTE — Telephone Encounter (Signed)
Pt denies any complaints - asked if he could drive (per d/c instructions pt can drive 24 hours after procedure) - has upcoming appt with Rosaria Ferries 6/23 and aware of this. Denies any other questions/concerns/symptoms

## 2019-03-22 ENCOUNTER — Telehealth: Payer: Self-pay | Admitting: *Deleted

## 2019-03-22 ENCOUNTER — Other Ambulatory Visit: Payer: Self-pay | Admitting: Cardiovascular Disease

## 2019-03-22 DIAGNOSIS — R0683 Snoring: Secondary | ICD-10-CM

## 2019-03-22 DIAGNOSIS — I2510 Atherosclerotic heart disease of native coronary artery without angina pectoris: Secondary | ICD-10-CM

## 2019-03-22 DIAGNOSIS — I10 Essential (primary) hypertension: Secondary | ICD-10-CM

## 2019-03-22 NOTE — Telephone Encounter (Signed)
Patient notified of [place] appointment scheduled at [facility] on 04/01/19.

## 2019-03-29 ENCOUNTER — Ambulatory Visit (INDEPENDENT_AMBULATORY_CARE_PROVIDER_SITE_OTHER): Payer: 59 | Admitting: Physician Assistant

## 2019-03-29 ENCOUNTER — Encounter: Payer: Self-pay | Admitting: Physician Assistant

## 2019-03-29 ENCOUNTER — Other Ambulatory Visit: Payer: Self-pay

## 2019-03-29 VITALS — BP 128/76 | HR 71 | Temp 96.4°F | Ht 71.0 in | Wt 289.6 lb

## 2019-03-29 DIAGNOSIS — E782 Mixed hyperlipidemia: Secondary | ICD-10-CM

## 2019-03-29 DIAGNOSIS — I10 Essential (primary) hypertension: Secondary | ICD-10-CM

## 2019-03-29 DIAGNOSIS — Z79899 Other long term (current) drug therapy: Secondary | ICD-10-CM

## 2019-03-29 DIAGNOSIS — I2510 Atherosclerotic heart disease of native coronary artery without angina pectoris: Secondary | ICD-10-CM

## 2019-03-29 NOTE — Progress Notes (Signed)
Cardiology Office Note   Date:  03/29/2019   ID:  Lance HaffJustin C Vigilante, DOB 04/17/1976, MRN [id number]  PCP:  [facility] Cardiologist:  Nona DellSamuel McDowell, MD 08/26/2018 Theodore Demarkhonda Barrett, PA-C   No chief complaint on file.   History of Present Illness: Lance [name] is a 43 y.o. male with a history of PCI to LCx/OM and RCA in 2018, hypertension, hyperlipidemia, type 2 diabetes mellitus, and obesity  Admitted 06/06-06/07/2019 for STEMI s/p DES pLAD (distal LAD embolization, med rx), PTCA CFX/OM1 bifurcation 90%>>60%, EF 40-45% Echo, A1c 7.7%, ?OSA>>sleep study as outpt  Lance HaffJustin C Reinhold presents for cardiology follow up  He has gone back to work, but is not out in the heat all day. Is aware he should not be out all day. Can do light duty, but does not want to sit all day.  No chest pain w/ exertion.   He is working on dietary changes. Eating less meat.  Trying to eat healthier.  Working with his Endocrinologist on diabetic as well as low-cholesterol eating habits.  Not waking w/ LE edema. Denies orthopnea or PND.  Dyspnea on exertion is at baseline.  Is having more Palpitations than pre-MI, heart beats fast and then goes back to normal.  Reminds him of the SVT symptoms, but does not last very long, stops on its own.  He has not had chest pain, shortness of breath, presyncope or syncope from them.  He is compliant with the medications, including Coreg at a higher dose than previous.  However, he wonders why the lisinopril was changed to losartan because he had good blood pressure control with the lisinopril.  Has not smoked.  The sleep study is scheduled for the near future.   Past Medical History:  Diagnosis Date  . CAD (coronary artery disease), native coronary artery    10/18 PCI/DES to mRCA, and OM, with total occlusion of dLCx with collaterals.   . Essential hypertension   . GERD (gastroesophageal reflux disease)   . History of kidney stones   . Hyperlipemia   .  SVT (supraventricular tachycardia) (HCC) 2015   Converted with adenosine  . Type 2 diabetes mellitus (HCC)     Past Surgical History:  Procedure Laterality Date  . CARDIAC SURGERY    . CORONARY ANGIOPLASTY WITH STENT PLACEMENT  07/15/2017   "2 stents"  . CORONARY/GRAFT ACUTE MI REVASCULARIZATION N/A 03/12/2019   Procedure: Coronary/Graft Acute MI Revascularization;  Surgeon: Yvonne KendallEnd, Christopher, MD;  Location: [facility] INVASIVE CV LAB;  Service: Cardiovascular;  Laterality: N/A;  . LAPAROSCOPIC APPENDECTOMY N/A 11/12/2017   Procedure: APPENDECTOMY LAPAROSCOPIC, REPAIR OF INCARCERATED INCISIONAL HERNIA;  Surgeon: Karie SodaGross, Steven, MD;  Location: [facility] ORS;  Service: General;  Laterality: N/A;  . LAPAROSCOPIC CHOLECYSTECTOMY    . LEFT HEART CATH AND CORONARY ANGIOGRAPHY N/A 07/15/2017   Procedure: LEFT HEART CATH AND CORONARY ANGIOGRAPHY;  Surgeon: Tonny Bollmanooper, Michael, MD;  [facility];  Service: Cardiovascular;  Laterality: N/A;  . LEFT HEART CATH AND CORONARY ANGIOGRAPHY N/A 03/12/2019   Procedure: LEFT HEART CATH AND CORONARY ANGIOGRAPHY;  Surgeon: Yvonne KendallEnd, Christopher, MD;  Location: [facility] INVASIVE CV LAB;  Service: Cardiovascular;  Laterality: N/A;    Current Outpatient Medications  Medication Sig Dispense Refill  . ALPRAZolam (XANAX) 1 MG tablet Take 1 mg by mouth daily as needed for anxiety.    Marland Kitchen. aspirin EC 81 MG tablet Take 81 mg by mouth daily.    Marland Kitchen. atorvastatin (LIPITOR) 80 MG tablet Take 1  tablet (80 mg total) by mouth daily. (Patient taking differently: Take 80 mg by mouth at bedtime. ) 30 tablet 0  . carvedilol (COREG) 6.25 MG tablet Take 1 tablet (6.25 mg total) by mouth 2 (two) times daily with a meal. 60 tablet 1  . Insulin Lispro (HUMALOG KWIKPEN) 200 UNIT/ML SOPN Inject 15-21 Units into the skin 3 (three) times daily before meals. 6 mL 2  . Insulin Pen Needle (PEN NEEDLES) 31G X 8 MM MISC 1 each by Does not apply route 4 (four) times daily. 400 each 1  . losartan (COZAAR) 100 MG tablet  Take 1 tablet (100 mg total) by mouth daily. 90 tablet 1  . metFORMIN (GLUCOPHAGE-XR) 500 MG 24 hr tablet Take 1 tablet (500 mg total) by mouth 2 (two) times daily. 180 tablet 1  . nitroGLYCERIN (NITROSTAT) 0.4 MG SL tablet Place 1 tablet (0.4 mg total) under the tongue every 5 (five) minutes x 3 doses as needed for chest pain. 25 tablet 1  . Omega-3 1000 MG CAPS Take 1,000 mg by mouth 2 (two) times daily.    . ticagrelor (BRILINTA) 90 MG TABS tablet Take 1 tablet (90 mg total) by mouth 2 (two) times daily. 180 tablet 2  . TRESIBA FLEXTOUCH 200 UNIT/ML SOPN INJECT 90 UNITS INTO THE SKIN AT BEDTIME (Patient taking differently: Inject 90 Units into the skin at bedtime. ) 9 mL 2   No current facility-administered medications for this visit.     Allergies:   Bee venom and Penicillins    Social History:  The patient  reports that he quit smoking about 3 weeks ago. His smoking use included cigarettes. He has a 13.00 pack-year smoking history. He has never used smokeless tobacco. He reports that he does not drink alcohol or use drugs.   Family History:  The patient's family history includes Diabetes in his mother; Gout in his mother; Heart attack (age of onset: 60) in his father; Heart disease (age of onset: 34) in his mother.  He indicated that his mother is alive. He indicated that his father is deceased. He indicated that his sister is alive. He indicated that his brother is alive.   ROS:  Please see the history of present illness. All other systems are reviewed and negative.   PHYSICAL EXAM: VS:  BP 128/76   Pulse 71   Temp (!) 96.4 F (35.8 C)   Ht 5\' 11"  (1.803 m)   Wt 289 lb 9.6 oz (131.4 kg)   SpO2 98%   BMI 40.39 kg/m  , BMI Body mass index is 40.39 kg/m. GEN: Well nourished, well developed, male in no acute distress HEENT: normal for age  Neck: no JVD seen, difficult to assess secondary to body habitus, no carotid bruit, no masses Cardiac: RRR; no murmur, no rubs, or gallops  Respiratory:  clear to auscultation bilaterally, normal work of breathing GI: soft, nontender, nondistended, + BS MS: no deformity or atrophy; no edema; distal pulses are 2+ in all 4 extremities  Skin: warm and dry, no rash Neuro:  Strength and sensation are intact Psych: euthymic mood, full affect   EKG:  EKG is ordered today. The ekg ordered today demonstrates sinus rhythm, heart rate 72, evolving MI changes in the inferolateral ST/T waves  ECHO: 03/13/2019  1. Severe akinesis of the left ventricular, apical anteroseptal wall.  2. The left ventricle has mild-moderately reduced systolic function, with an ejection fraction of 40-45%. The cavity size was normal. Left ventricular diastolic  Doppler parameters are consistent with pseudonormalization.  3. The right ventricle has normal systolic function. The cavity was normal. There is no increase in right ventricular wall thickness.  4. No stenosis of the aortic valve.  5. The aortic root and ascending aorta are normal in size and structure.  6. The interatrial septum was not assessed.   CATH: 03/12/19  Conclusions: 1. Severe two-vessel CAD with 90% stenosis of proximal LAD just beyond D1 with thrombos (some of which had embolized to the apical LAD) and 99% in-stent restenosis of OM1 and 90% stenosis of jailed mid LCx. 2. Mild in-stent restenosis involving the mid RCA. 3. Moderately elevated left ventricular filling pressure with suggestion of anterior hypokinesis on the left ventriculogram (suboptimal opacification with hand-injection). 4. Successful PCI to proximal LAD using Synergy 3.0 x 2 mm DES (post-dilated proximally to 4.0 mm) with 0% residual stenosis and TIMI-3 flow. 5. Success balloon angioplasty to LCx/OM1 stent using [place] Euphora 2.25 x 8 mm and Sapphire 2.5 x 12 mm balloons with reduction in stenosis of OM1 from 99% to 40% and mid LCx from 90% to 60% and restoration of TIMI-3 flow. Given improving chest pain, relatively small  vessel size with need for complex bifurcation intervention, and radiation/contrast doses administered to that point, decision was made to forego additional intervention to this vessel.  Recommendations: 1. Continue tirofiban infusion x 18 hours. 2. Dual antiplatelet therapy with aspirin and ticagrelor for at least 12 months, ideally longer. 3. Aggressive secondary prevention. 4. Obtain transthoracic echocardiogram to assess LV function. 5. If patient has refractory chest pain, complex bifurcation intervention to previously stented LCx/OM1 and AV groove LCx could be considered, though I worry about restenosis in the future given aggressive restenosis since prior PCI in 2018, relatively small vessel size, and likely need for culotte stenting of the LCx.  Yvonne Kendallhristopher End, MD [facility]  Diagnostic  Dominance: Right    Intervention      Recent Labs: 11/19/2018: ALT 42 03/14/2019: Hemoglobin 15.7; Magnesium 2.0; Platelets 190 03/16/2019: BUN 14; Creatinine, Ser 0.99; Potassium 3.9; Sodium 137  CBC    Component Value Date/Time   WBC 14.4 (H) 03/14/2019 0301   RBC 5.24 03/14/2019 0301   HGB 15.7 03/14/2019 0301   HCT 46.1 03/14/2019 0301   PLT 190 03/14/2019 0301   MCV 88.0 03/14/2019 0301   MCH 30.0 03/14/2019 0301   MCHC 34.1 03/14/2019 0301   RDW 13.1 03/14/2019 0301   LYMPHSABS 3.1 09/05/2017 0607   MONOABS 1.3 (H) 09/05/2017 0607   EOSABS 0.4 09/05/2017 0607   BASOSABS 0.0 09/05/2017 0607   CMP Latest Ref Rng & Units 03/16/2019 03/14/2019 03/13/2019  Glucose 70 - 99 mg/dL 811(B131(H) 147(W152(H) 295(A247(H)  BUN 6 - 20 mg/dL 14 14 13   Creatinine 0.61 - 1.24 mg/dL 2.130.99 0.861.01 5.781.10  Sodium 135 - 145 mmol/L 137 135 139  Potassium 3.5 - 5.1 mmol/L 3.9 4.0 3.7  Chloride 98 - 111 mmol/L 105 102 102  CO2 22 - 32 mmol/L 25 24 26   Calcium 8.9 - 10.3 mg/dL 9.0 9.0 9.3  Total Protein 6.1 - 8.1 g/dL - - -  Total Bilirubin 0.2 - 1.2 mg/dL - - -  Alkaline Phos 38 - 126 U/L - - -  AST 10 - 40 U/L  - - -  ALT 9 - 46 U/L - - -     Lipid Panel Lab Results  Component Value Date   CHOL 124 03/13/2019   HDL 20 (L) 03/13/2019  LDLCALC 57 03/13/2019   TRIG 234 (H) 03/13/2019   CHOLHDL 6.2 03/13/2019     Wt Readings from Last 3 Encounters:  03/29/19 289 lb 9.6 oz (131.4 kg)  03/16/19 276 lb 10.8 oz (125.5 kg)  11/30/18 281 lb (127.5 kg)     Other studies Reviewed: Additional studies/ records that were reviewed today include: office notes, hospital records and testing.   ASSESSMENT AND PLAN:  1.  STEMI involving the LAD and CFX/OM1, subsequent care - He is compliant with medications including aspirin, Brilinta, high-dose statin, beta-blocker and ARB - Because of his work schedule, I am not sure he will be able to attend cardiac rehab, but he is encouraged to exercise on his own. -The importance of cardiac risk factor reduction was emphasized. -Continue current therapy and increase activity gradually, he should take 4 to 6 weeks to get back to full activity.  2.  Dyslipidemia - LDL was at goal when last checked, but HDL is low - Explained that the best way to get that up is with exercise. -Should recheck in 3 months  3.  Sleep apnea - Keep appointment for sleep study  4.  VF arrest: -He was on amiodarone in the hospital but this was discontinued. - Continue beta-blocker, blood pressure is too low to increase the dose -Palpitations are not associated with near syncope or syncope  4.  SVT: - The palpitations he has been having since his MI sound more consistent with SVT than VF -He is asymptomatic with them - Hopefully, the episodes will improve as he recovers from his MI.  If they do not, or if he becomes symptomatic from them, he is to call us.   Current medicines are reviewed at length with the patient today.  The patient has concerns regarding medicines.  Concerns were addressed  The following changes have been made:  no change  Labs/ tests ordered today include:    Orders Placed This Encounter  Procedures  . Lipid panel  . Comprehensive metabolic panel  . EKG 12-Lead     Disposition:   FU with Nona DellSamuel McDowell, MD  Signed, Theodore Demarkhonda Barrett, PA-C  03/29/2019 12:37 PM    [facility] Phone: [phone](336) [phone]; Fax: [phone]

## 2019-03-29 NOTE — Patient Instructions (Addendum)
Medication Instructions:   Your physician recommends that you continue on your current medications as directed. Please refer to the Current Medication list given to you today.  Labwork:  Your physician recommends that you return for a FASTING lipid profile & CMET in 3 months just before your next visit to check your CMET and lipid profile. Please do not eat after midnight when you have this done. Please go to [facility]. Lab orders given during visit.  Testing/Procedures:  NONE  Follow-Up:  Your physician recommends that you schedule a follow-up appointment in: 3 months with Dr. Domenic Polite.  Any Other Special Instructions Will Be Listed Below (If Applicable).   Increase your exertion gradually. Your exertion level is 5/10.  It will take 4-6 weeks to get up to full speed  Complete your sleep study  If you need a refill on your cardiac medications before your next appointment, please call your pharmacy.

## 2019-03-30 ENCOUNTER — Ambulatory Visit: Payer: 59 | Admitting: "Endocrinology

## 2019-03-31 ENCOUNTER — Other Ambulatory Visit: Payer: Self-pay | Admitting: "Endocrinology

## 2019-04-01 ENCOUNTER — Other Ambulatory Visit: Payer: Self-pay

## 2019-04-01 ENCOUNTER — Ambulatory Visit: Payer: 59 | Attending: Cardiovascular Disease | Admitting: Cardiovascular Disease

## 2019-04-01 DIAGNOSIS — Z7982 Long term (current) use of aspirin: Secondary | ICD-10-CM | POA: Diagnosis not present

## 2019-04-01 DIAGNOSIS — I2510 Atherosclerotic heart disease of native coronary artery without angina pectoris: Secondary | ICD-10-CM

## 2019-04-01 DIAGNOSIS — Z79899 Other long term (current) drug therapy: Secondary | ICD-10-CM | POA: Diagnosis not present

## 2019-04-01 DIAGNOSIS — Z6835 Body mass index (BMI) 35.0-35.9, adult: Secondary | ICD-10-CM | POA: Insufficient documentation

## 2019-04-01 DIAGNOSIS — I10 Essential (primary) hypertension: Secondary | ICD-10-CM

## 2019-04-01 DIAGNOSIS — Z794 Long term (current) use of insulin: Secondary | ICD-10-CM | POA: Insufficient documentation

## 2019-04-01 DIAGNOSIS — R0683 Snoring: Secondary | ICD-10-CM | POA: Diagnosis present

## 2019-04-01 DIAGNOSIS — G478 Other sleep disorders: Secondary | ICD-10-CM | POA: Diagnosis not present

## 2019-04-03 ENCOUNTER — Encounter (HOSPITAL_COMMUNITY): Payer: Self-pay

## 2019-04-03 ENCOUNTER — Other Ambulatory Visit: Payer: Self-pay

## 2019-04-03 ENCOUNTER — Emergency Department (HOSPITAL_COMMUNITY): Payer: 59

## 2019-04-03 ENCOUNTER — Observation Stay (HOSPITAL_COMMUNITY)
Admission: EM | Admit: 2019-04-03 | Discharge: 2019-04-05 | Disposition: A | Discharge status: Home / Self Care | Payer: 59 | Attending: Family Medicine | Admitting: Family Medicine

## 2019-04-03 DIAGNOSIS — K219 Gastro-esophageal reflux disease without esophagitis: Secondary | ICD-10-CM | POA: Diagnosis not present

## 2019-04-03 DIAGNOSIS — Z794 Long term (current) use of insulin: Secondary | ICD-10-CM | POA: Diagnosis not present

## 2019-04-03 DIAGNOSIS — I252 Old myocardial infarction: Secondary | ICD-10-CM | POA: Insufficient documentation

## 2019-04-03 DIAGNOSIS — Z880 Allergy status to penicillin: Secondary | ICD-10-CM | POA: Insufficient documentation

## 2019-04-03 DIAGNOSIS — Z8249 Family history of ischemic heart disease and other diseases of the circulatory system: Secondary | ICD-10-CM | POA: Diagnosis not present

## 2019-04-03 DIAGNOSIS — Z87891 Personal history of nicotine dependence: Secondary | ICD-10-CM | POA: Insufficient documentation

## 2019-04-03 DIAGNOSIS — I255 Ischemic cardiomyopathy: Secondary | ICD-10-CM | POA: Insufficient documentation

## 2019-04-03 DIAGNOSIS — Z1159 Encounter for screening for other viral diseases: Secondary | ICD-10-CM | POA: Diagnosis not present

## 2019-04-03 DIAGNOSIS — I4719 Other supraventricular tachycardia: Secondary | ICD-10-CM

## 2019-04-03 DIAGNOSIS — Z7982 Long term (current) use of aspirin: Secondary | ICD-10-CM | POA: Diagnosis not present

## 2019-04-03 DIAGNOSIS — I471 Supraventricular tachycardia: Secondary | ICD-10-CM | POA: Diagnosis not present

## 2019-04-03 DIAGNOSIS — Z79899 Other long term (current) drug therapy: Secondary | ICD-10-CM | POA: Diagnosis not present

## 2019-04-03 DIAGNOSIS — E782 Mixed hyperlipidemia: Secondary | ICD-10-CM | POA: Diagnosis not present

## 2019-04-03 DIAGNOSIS — I2510 Atherosclerotic heart disease of native coronary artery without angina pectoris: Secondary | ICD-10-CM | POA: Diagnosis not present

## 2019-04-03 DIAGNOSIS — I10 Essential (primary) hypertension: Secondary | ICD-10-CM | POA: Diagnosis not present

## 2019-04-03 DIAGNOSIS — IMO0002 Reserved for concepts with insufficient information to code with codable children: Secondary | ICD-10-CM | POA: Diagnosis present

## 2019-04-03 DIAGNOSIS — Z955 Presence of coronary angioplasty implant and graft: Secondary | ICD-10-CM | POA: Diagnosis not present

## 2019-04-03 DIAGNOSIS — E1165 Type 2 diabetes mellitus with hyperglycemia: Secondary | ICD-10-CM | POA: Insufficient documentation

## 2019-04-03 DIAGNOSIS — Z6839 Body mass index (BMI) 39.0-39.9, adult: Secondary | ICD-10-CM | POA: Insufficient documentation

## 2019-04-03 LAB — CBC
HCT: 43.1 % (ref 39.0–52.0)
Hemoglobin: 14.6 g/dL (ref 13.0–17.0)
MCH: 30.2 pg (ref 26.0–34.0)
MCHC: 33.9 g/dL (ref 30.0–36.0)
MCV: 89 fL (ref 80.0–100.0)
Platelets: 251 10*3/uL (ref 150–400)
RBC: 4.84 MIL/uL (ref 4.22–5.81)
RDW: 12.9 % (ref 11.5–15.5)
WBC: 15.4 10*3/uL — ABNORMAL HIGH (ref 4.0–10.5)
nRBC: 0 % (ref 0.0–0.2)

## 2019-04-03 LAB — CBG MONITORING, ED: Glucose-Capillary: 208 mg/dL — ABNORMAL HIGH (ref 70–99)

## 2019-04-03 LAB — SARS CORONAVIRUS 2 BY RT PCR (HOSPITAL ORDER, PERFORMED IN ~~LOC~~ HOSPITAL LAB): SARS Coronavirus 2: NEGATIVE

## 2019-04-03 LAB — BASIC METABOLIC PANEL
Anion gap: 10 (ref 5–15)
BUN: 15 mg/dL (ref 6–20)
CO2: 23 mmol/L (ref 22–32)
Calcium: 8.9 mg/dL (ref 8.9–10.3)
Chloride: 102 mmol/L (ref 98–111)
Creatinine, Ser: 1.03 mg/dL (ref 0.61–1.24)
GFR calc Af Amer: >60 mL/min (ref >60)
GFR calc non Af Amer: >60 mL/min (ref >60)
Glucose, Bld: 232 mg/dL — ABNORMAL HIGH (ref 70–99)
Potassium: 3.6 mmol/L (ref 3.5–5.1)
Sodium: 135 mmol/L (ref 135–145)

## 2019-04-03 LAB — MAGNESIUM: Magnesium: 1.9 mg/dL (ref 1.7–2.4)

## 2019-04-03 MED ORDER — ASPIRIN EC 81 MG PO TBEC
81.0000 mg | DELAYED_RELEASE_TABLET | Freq: Every day | ORAL | Status: DC
  Administered 2019-04-04 – 2019-04-05 (×2): 81 mg via ORAL
  Filled 2019-04-03 (×2): qty 1

## 2019-04-03 MED ORDER — INSULIN ASPART 100 UNIT/ML ~~LOC~~ SOLN
0.0000 [IU] | Freq: Every day | SUBCUTANEOUS | Status: DC
  Administered 2019-04-03: 23:00:00 2 [IU] via SUBCUTANEOUS
  Filled 2019-04-03: qty 1

## 2019-04-03 MED ORDER — ADENOSINE 6 MG/2ML IV SOLN
INTRAVENOUS | Status: AC
  Administered 2019-04-03: 21:00:00 6 mg via INTRAVENOUS
  Filled 2019-04-03: qty 2

## 2019-04-03 MED ORDER — ENOXAPARIN SODIUM 40 MG/0.4ML ~~LOC~~ SOLN
40.0000 mg | SUBCUTANEOUS | Status: DC
  Filled 2019-04-03: qty 0.4

## 2019-04-03 MED ORDER — TICAGRELOR 90 MG PO TABS
90.0000 mg | ORAL_TABLET | Freq: Two times a day (BID) | ORAL | Status: DC
  Administered 2019-04-04 – 2019-04-05 (×4): 90 mg via ORAL
  Filled 2019-04-03 (×4): qty 1

## 2019-04-03 MED ORDER — CARVEDILOL 12.5 MG PO TABS
6.2500 mg | ORAL_TABLET | Freq: Once | ORAL | Status: AC
  Administered 2019-04-03: 19:00:00 6.25 mg via ORAL
  Filled 2019-04-03: qty 1

## 2019-04-03 MED ORDER — CHLORHEXIDINE GLUCONATE CLOTH 2 % EX PADS
6.0000 | MEDICATED_PAD | Freq: Every day | CUTANEOUS | Status: DC
  Administered 2019-04-04: 6 via TOPICAL

## 2019-04-03 MED ORDER — ALPRAZOLAM 0.5 MG PO TABS: 1.0000 mg | ORAL_TABLET | Freq: Every day | ORAL | Status: DC | PRN

## 2019-04-03 MED ORDER — ADENOSINE 6 MG/2ML IV SOLN
6.0000 mg | Freq: Once | INTRAVENOUS | Status: AC
  Administered 2019-04-03: 21:00:00 6 mg via INTRAVENOUS
  Filled 2019-04-03: qty 2

## 2019-04-03 MED ORDER — LOSARTAN POTASSIUM 50 MG PO TABS: 100.0000 mg | ORAL_TABLET | Freq: Every day | ORAL | Status: DC

## 2019-04-03 MED ORDER — INSULIN GLARGINE 100 UNIT/ML ~~LOC~~ SOLN
70.0000 [IU] | Freq: Every day | SUBCUTANEOUS | Status: DC
  Administered 2019-04-04 (×2): 70 [IU] via SUBCUTANEOUS
  Filled 2019-04-03 (×3): qty 0.7

## 2019-04-03 MED ORDER — INSULIN ASPART 100 UNIT/ML ~~LOC~~ SOLN
0.0000 [IU] | Freq: Three times a day (TID) | SUBCUTANEOUS | Status: DC
  Administered 2019-04-04: 4 [IU] via SUBCUTANEOUS
  Administered 2019-04-04: 3 [IU] via SUBCUTANEOUS

## 2019-04-03 MED ORDER — NITROGLYCERIN 0.4 MG SL SUBL: 0.4000 mg | SUBLINGUAL_TABLET | SUBLINGUAL | Status: DC | PRN

## 2019-04-03 MED ORDER — SODIUM CHLORIDE 0.9 % IV SOLN
INTRAVENOUS | Status: DC
  Administered 2019-04-03 – 2019-04-04 (×2): via INTRAVENOUS

## 2019-04-03 MED ORDER — DILTIAZEM HCL 100 MG IV SOLR
5.0000 mg/h | INTRAVENOUS | Status: DC
  Administered 2019-04-03: 21:00:00 5 mg/h via INTRAVENOUS
  Filled 2019-04-03: qty 100

## 2019-04-03 MED ORDER — POTASSIUM CHLORIDE CRYS ER 20 MEQ PO TBCR
40.0000 meq | EXTENDED_RELEASE_TABLET | Freq: Once | ORAL | Status: AC
  Administered 2019-04-03: 40 meq via ORAL
  Filled 2019-04-03: qty 2

## 2019-04-03 MED ORDER — ADENOSINE 6 MG/2ML IV SOLN
6.0000 mg | Freq: Once | INTRAVENOUS | Status: AC
  Administered 2019-04-03: 6 mg via INTRAVENOUS

## 2019-04-03 MED ORDER — INSULIN DETEMIR 100 UNIT/ML ~~LOC~~ SOLN
10.0000 [IU] | Freq: Two times a day (BID) | SUBCUTANEOUS | Status: DC
  Filled 2019-04-03 (×2): qty 0.1

## 2019-04-03 MED ORDER — ATORVASTATIN CALCIUM 40 MG PO TABS
80.0000 mg | ORAL_TABLET | Freq: Every day | ORAL | Status: DC
  Administered 2019-04-04 (×2): 80 mg via ORAL
  Filled 2019-04-03 (×2): qty 2

## 2019-04-03 MED ORDER — OMEGA-3-ACID ETHYL ESTERS 1 G PO CAPS
1000.0000 mg | ORAL_CAPSULE | Freq: Two times a day (BID) | ORAL | Status: DC
  Administered 2019-04-04 – 2019-04-05 (×3): 1000 mg via ORAL
  Filled 2019-04-03 (×4): qty 1

## 2019-04-03 MED ORDER — CARVEDILOL 3.125 MG PO TABS: 6.2500 mg | ORAL_TABLET | Freq: Two times a day (BID) | ORAL | Status: DC

## 2019-04-03 NOTE — ED Triage Notes (Signed)
Pt reports to ED with complaints of heart racing which started 30 minutes ago. Pt took one nitro prior to arrival.

## 2019-04-03 NOTE — ED Provider Notes (Signed)
[facility] EMERGENCY DEPARTMENT Provider Note   CSN: [id number] Arrival date & time: 04/03/19  1815     History   Chief Complaint Chief Complaint  Patient presents with  . Chest Pain    HPI Lance [name] is a 43 y.o. male.     Patient presents emergency department with a complaint of heart racing which started about 30 minutes prior to arrival.  He took 1 nitroglycerin but it made no difference.  Patient without any significant chest pain.  Has had a past history of supraventricular tachycardia type 2 diabetes.  Coronary artery disease.  Patient with a stent placement.  Patient known to have significant coronary artery disease.  Patient also has diabetes.  Patient with recent admission on June 6 for acute ST elevation MI.  Involving the left main coronary artery.  Patient was a heavy smoker and quit on June 1.  Most recent cardiac catheterization was June 6.  No significant chest pain today.     Past Medical History:  Diagnosis Date  . CAD (coronary artery disease), native coronary artery    10/18 PCI/DES to mRCA, and OM, with total occlusion of dLCx with collaterals.   . Essential hypertension   . GERD (gastroesophageal reflux disease)   . History of kidney stones   . Hyperlipemia   . SVT (supraventricular tachycardia) ([place]) 2015   Converted with adenosine  . Type 2 diabetes mellitus [facility])     Patient Active Problem List   Diagnosis Date Noted  . Acute ST elevation myocardial infarction (STEMI) involving left main coronary artery ([place])   . VF (ventricular fibrillation) ([place])   . Loud snoring   . STEMI (ST elevation myocardial infarction) ([place]) 03/12/2019  . STEMI involving left anterior descending coronary artery ([place]) 03/12/2019  . Incarcerated incisional hernia s/p redeuction/primary repair 11/12/2017 11/12/2017  . Chronic anticoagulation (Plavix) 11/12/2017  . Chronic appendicitis s/p lap appendectomy 11/12/2017 09/05/2017  . S/P drug eluting coronary stent placement    . CAD (coronary artery disease) 07/15/2017  . Severe obesity with body mass index (BMI) of 35.0 to 39.9 with comorbidity ([place]) 05/20/2017  . Family history of early CAD 12/29/2016  . Type 2 diabetes mellitus, uncontrolled ([place]) 08/31/2016  . Mixed hyperlipidemia 08/31/2016  . Current smoker 08/31/2016  . Essential hypertension, benign 04/21/2013    Past Surgical History:  Procedure Laterality Date  . CARDIAC SURGERY    . CORONARY ANGIOPLASTY WITH STENT PLACEMENT  07/15/2017   "2 stents"  . CORONARY/GRAFT ACUTE MI REVASCULARIZATION N/A 03/12/2019   Procedure: Coronary/Graft Acute MI Revascularization;  Surgeon: Nelva Bush, MD;  Location: [place] [facility];  Service: Cardiovascular;  Laterality: N/A;  . LAPAROSCOPIC APPENDECTOMY N/A 11/12/2017   Procedure: APPENDECTOMY LAPAROSCOPIC, REPAIR OF INCARCERATED INCISIONAL HERNIA;  Surgeon: Michael Boston, MD;  Location: [facility];  Service: General;  Laterality: N/A;  . LAPAROSCOPIC CHOLECYSTECTOMY    . LEFT HEART CATH AND CORONARY ANGIOGRAPHY N/A 07/15/2017   Procedure: LEFT HEART CATH AND CORONARY ANGIOGRAPHY;  Surgeon: Sherren Mocha, MD;  Location: [place] [facility];  Service: Cardiovascular;  Laterality: N/A;  . LEFT HEART CATH AND CORONARY ANGIOGRAPHY N/A 03/12/2019   Procedure: LEFT HEART CATH AND CORONARY ANGIOGRAPHY;  Surgeon: Nelva Bush, MD;  Location: [place] [facility];  Service: Cardiovascular;  Laterality: N/A;        Home Medications    Prior to Admission medications   Medication Sig Start Date End Date Taking? Authorizing Provider  ALPRAZolam Duanne Moron) 1 MG  tablet Take 1 mg by mouth daily as needed for anxiety.   Yes [provider]  aspirin EC 81 MG tablet Take 81 mg by mouth daily.   Yes [provider]  atorvastatin (LIPITOR) 80 MG tablet Take 1 tablet (80 mg total) by mouth daily. Patient taking differently: Take 80 mg by mouth at bedtime.  09/01/16  Yes Standley BrookingGoodrich, Daniel P, MD  carvedilol (COREG)  6.25 MG tablet Take 1 tablet (6.25 mg total) by mouth 2 (two) times daily with a meal. 03/16/19  Yes Arty Baumgartneroberts, Lindsay B, NP  HUMALOG KWIKPEN 200 UNIT/ML SOPN INJECT 15-21 UNITS INTO THE SKIN THREE TIMES DAILY BEFORE MEALS 03/31/19  Yes Nida, Denman GeorgeGebreselassie W, MD  losartan (COZAAR) 100 MG tablet Take 1 tablet (100 mg total) by mouth daily. 03/17/19  Yes Arty Baumgartneroberts, Lindsay B, NP  metFORMIN (GLUCOPHAGE-XR) 500 MG 24 hr tablet Take 1 tablet (500 mg total) by mouth 2 (two) times daily. 01/11/19  Yes Nida, Denman GeorgeGebreselassie W, MD  Omega-3 1000 MG CAPS Take 1,000 mg by mouth 2 (two) times daily.   Yes [provider]  ticagrelor (BRILINTA) 90 MG TABS tablet Take 1 tablet (90 mg total) by mouth 2 (two) times daily. 03/16/19  Yes Arty Baumgartneroberts, Lindsay B, NP  TRESIBA FLEXTOUCH 200 UNIT/ML SOPN INJECT 90 UNITS INTO THE SKIN AT BEDTIME Patient taking differently: Inject 90 Units into the skin at bedtime.  02/14/19  Yes Nida, Denman GeorgeGebreselassie W, MD  Insulin Pen Needle (PEN NEEDLES) 31G X 8 MM MISC 1 each by Does not apply route 4 (four) times daily. 01/11/19   Roma KayserNida, Gebreselassie W, MD  nitroGLYCERIN (NITROSTAT) 0.4 MG SL tablet Place 1 tablet (0.4 mg total) under the tongue every 5 (five) minutes x 3 doses as needed for chest pain. 03/16/19   Arty Baumgartneroberts, Lindsay B, NP    Family History Family History  Problem Relation Age of Onset  . Heart disease Mother [id number]       CABG  . Diabetes Mother   . Gout Mother   . Heart attack Father [id number]    Social History Social History   Tobacco Use  . Smoking status: Former Smoker    Packs/day: 0.50    Years: 26.00    Pack years: 13.00    Types: Cigarettes    Quit date: 03/07/2019    Years since quitting: 0.0  . Smokeless tobacco: Never Used  Substance Use Topics  . Alcohol use: No  . Drug use: No     Allergies   Bee venom and Penicillins   Review of Systems Review of Systems  Constitutional: Negative for chills and fever.  HENT: Negative for congestion, rhinorrhea and sore  throat.   Eyes: Negative for visual disturbance.  Respiratory: Negative for cough and shortness of breath.   Cardiovascular: Positive for palpitations. Negative for chest pain and leg swelling.  Gastrointestinal: Negative for abdominal pain, diarrhea, nausea and vomiting.  Genitourinary: Negative for dysuria.  Musculoskeletal: Negative for back pain and neck pain.  Skin: Negative for rash.  Neurological: Negative for dizziness, light-headedness and headaches.  Hematological: Does not bruise/bleed easily.  Psychiatric/Behavioral: Negative for confusion.     Physical Exam Updated Vital Signs BP (!) 166/144   Pulse (!) 135   Temp 97.9 F (36.6 C) (Oral)   Resp 19   Ht 1.803 m (5\' 11" )   Wt 131.1 kg   SpO2 98%   BMI 40.31 kg/m   Physical Exam Vitals signs and nursing note reviewed.  Constitutional:      Appearance: Normal appearance. He is well-developed.  HENT:     Head: Normocephalic and atraumatic.  Eyes:     Extraocular Movements: Extraocular movements intact.     Conjunctiva/sclera: Conjunctivae normal.     Pupils: Pupils are equal, round, and reactive to light.  Neck:     Musculoskeletal: Normal range of motion and neck supple.  Cardiovascular:     Rate and Rhythm: Regular rhythm. Tachycardia present.     Heart sounds: No murmur.  Pulmonary:     Effort: Pulmonary effort is normal. No respiratory distress.     Breath sounds: Normal breath sounds.  Abdominal:     Palpations: Abdomen is soft.     Tenderness: There is no abdominal tenderness.  Musculoskeletal: Normal range of motion.  Skin:    General: Skin is warm and dry.     Capillary Refill: Capillary refill takes less than 2 seconds.  Neurological:     General: No focal deficit present.     Mental Status: He is alert and oriented to person, place, and time.      ED Treatments / Results  Labs (all labs ordered are listed, but only abnormal results are displayed) Labs Reviewed  BASIC METABOLIC PANEL -  Abnormal; Notable for the following components:      Result Value   Glucose, Bld 232 (*)    All other components within normal limits  CBC - Abnormal; Notable for the following components:   WBC 15.4 (*)    All other components within normal limits  SARS CORONAVIRUS 2 (HOSPITAL ORDER[facility])    EKG EKG Interpretation  Date/Time:  Sunday April 03 2019 18:24:19 EDT Ventricular Rate:  136 PR Interval:    QRS Duration: 141 QT Interval:  319 QTC Calculation: 480 R Axis:   127 Text Interpretation:  Junctional tachycardia Nonspecific intraventricular conduction delay Anteroseptal infarct, age indeterminate Lateral leads are also involved Confirmed by Vanetta MuldersZackowski, Bennetta Rudden [phone]([id number]) on 04/03/2019 6:33:38 PM   Radiology Dg Chest Port 1 View  Result Date: 04/03/2019 CLINICAL DATA:  Heart began racing 30 minutes ago, history coronary artery disease, type II diabetes mellitus, hypertension, GERD, SVT EXAM: PORTABLE CHEST 1 VIEW COMPARISON:  Portable exam 1904 hours compared to 03/12/2019 FINDINGS: Enlargement of cardiac silhouette. Mediastinal contours and pulmonary vascularity normal. Lungs clear. No infiltrate, pleural effusion or pneumothorax. Minimal central peribronchial thickening. IMPRESSION: Enlargement of cardiac silhouette. Minimal bronchitic changes without infiltrate. Electronically Signed   By: Ulyses SouthwardMark  Boles M.D.   On: 04/03/2019 19:19    Procedures Procedures (including critical care time)  CRITICAL CARE Performed by: Vanetta MuldersScott Trong Gosling Total critical care time: 45 minutes Critical care time was exclusive of separately billable procedures and treating other patients. Critical care was necessary to treat or prevent imminent or life-threatening deterioration. Critical care was time spent personally by me on the following activities: development of treatment plan with patient and/or surrogate as well as nursing, discussions with consultants, evaluation of  patient's response to treatment, examination of patient, obtaining history from patient or surrogate, ordering and performing treatments and interventions, ordering and review of laboratory studies, ordering and review of radiographic studies, pulse oximetry and re-evaluation of patient's condition.  Medications Ordered in ED Medications  0.9 %  sodium chloride infusion ( Intravenous New Bag/Given 04/03/19 1903)  diltiazem (CARDIZEM) 100 mg in dextrose 5 % 100 mL (1 mg/mL) infusion (10 mg/hr Intravenous Rate/Dose Change 04/03/19 2115)  carvedilol (COREG) tablet 6.25  mg (6.25 mg Oral Given 04/03/19 1904)  adenosine (ADENOCARD) 6 MG/2ML injection 6 mg (6 mg Intravenous Given 04/03/19 2052)  adenosine (ADENOCARD) 6 MG/2ML injection 6 mg (6 mg Intravenous Given 04/03/19 2055)     Initial Impression / Assessment and Plan / ED Course  I have reviewed the triage vital signs and the nursing notes.  Pertinent labs & imaging results that were available during my care of the patient were reviewed by me and considered in my medical decision making (see chart for details).       Patient with what appeared to be a supraventricular tachycardia.  Patient given 6 mg of adenosine x2.  First dose did not cause any significant pause.  Second dose caused a pause.  But then patient quickly went back into rapid heart rate.  So patient started on diltiazem drip.  And titrated.  This started to improve his heart rate.  Discussed with hospitalist for admission.  Patient's chest x-ray showed enlargement of the cardiac silhouette but no opacities or pulmonary edema.  Prior to doing the diltiazem drip did give patient his evening dose of Coreg.  But that had no effect.  Patient's heart rate was in the 140 range.  Prior to treatment.  Labs without any significant abnormalities other than a white blood cell count of 15,000.  Other than the fast heart rate patient remained hemodynamically stable.  Blood pressure was fine.    Final Clinical Impressions(s) / ED Diagnoses   Final diagnoses:  SVT (supraventricular tachycardia) [facility](HCC)    ED Discharge Orders    None       Vanetta MuldersZackowski, Adeola Dennen, MD 04/19/19 [phone]

## 2019-04-03 NOTE — ED Notes (Signed)
ICU wanted troponin before patient came upstairs. Hospital discontinued the troponin order. ICU advised troponin order discontinued. Patient's heart rate is at 80 now.mHospitalist gave verbal order to reduce Cardizem drip down to 5 and to discontinue in 1 hour.

## 2019-04-03 NOTE — H&P (Addendum)
TRH H&P   Patient Demographics:    Lance [name], is a 43 y.o. male  MRN: [id number]   DOB - 04/19/1976  Admit Date - 04/03/2019  Outpatient Primary MD for the patient is Practice, [facility]  Referring MD/NP/PA: Dr Bettey MareZakorski  Outpatient Specialists: Dr Diona BrownerMcdowell  Patient coming from: Home  Chief Complaint  Patient presents with  . Chest Pain      HPI:    Lance [name]  is a 43 y.o. male, with a history diabetes mellitus type 2, hyperlipidemia, obesity, CAD with history of of PCI to LCx/OM and RCA in 2018, hypertension. Patient recently admitted [facility]  06/06-06/07/2019 for STEMI s/p DES pLAD (distal LAD embolization, med rx), PTCA CFX/OM1 bifurcation 90%>>60%, EF 40-45% Echo, A1c 7.7%,  Patient presents to ED secondary to complaints of palpitation, reports episode 30 minutes before presentation to ED, he reports history of intermittent palpitation since his recent MRI 2 weeks ago at [facility], orts brief, resolved without intervention, but this time it lasted little bit longer which prompted him to come to ED, reports he had a history of SVT 2 years ago after his previous MI resembles current symptoms, patient reports he drinks half to 1 cups of coffee per day, drinks one diet Coke per day, denies any excessive caffeine, alcohol or energy drink intake, he denies chest pain, shortness of breath, hemoptysis, fever, chills, nausea or vomiting, no dizziness or lightheadedness. - in ED patient EKG showing junctional tachycardia, per discussion with cardiology felt to be more atrial tachycardia, he did receive adenosine 6 mg x 2, with no conversion of rhythm, potassium was 3.6, magnesium still pending, as per discussion with [facility]y at [facility], suspicion for ischemic events currently, this is most likely arrhythmia following his recent MRI, recommendation for Cardizem drip  overnight and official cardiology consult.    Review of systems:    In addition to the HPI above,  No Fever-chills, No Headache, No changes with Vision or hearing, No problems swallowing food or Liquids, No Chest pain, Cough or Shortness of Breath, reports palpitation No Abdominal pain, No Nausea or Vommitting, Bowel movements are regular, No Blood in stool or Urine, No dysuria, No new skin rashes or bruises, No new joints pains-aches,  No new weakness, tingling, numbness in any extremity, No recent weight gain or loss, No polyuria, polydypsia or polyphagia, No significant Mental Stressors.  A full 10 point Review of Systems was done, except as stated above, all other Review of Systems were negative.   With Past History of the following :    Past Medical History:  Diagnosis Date  . CAD (coronary artery disease), native coronary artery    10/18 PCI/DES to mRCA, and OM, with total occlusion of dLCx with collaterals.   . Essential hypertension   . GERD (gastroesophageal reflux disease)   . History of kidney stones   .  Hyperlipemia   . SVT (supraventricular tachycardia) (HCC) 2015   Converted with adenosine  . Type 2 diabetes mellitus (HCC)       Past Surgical History:  Procedure Laterality Date  . CARDIAC SURGERY    . CORONARY ANGIOPLASTY WITH STENT PLACEMENT  07/15/2017   "2 stents"  . CORONARY/GRAFT ACUTE MI REVASCULARIZATION N/A 03/12/2019   Procedure: Coronary/Graft Acute MI Revascularization;  Surgeon: Yvonne KendallEnd, Christopher, MD;  Location: [facility] INVASIVE CV LAB;  Service: Cardiovascular;  Laterality: N/A;  . LAPAROSCOPIC APPENDECTOMY N/A 11/12/2017   Procedure: APPENDECTOMY LAPAROSCOPIC, REPAIR OF INCARCERATED INCISIONAL HERNIA;  Surgeon: Karie SodaGross, Steven, MD;  Location: [facility] ORS;  Service: General;  Laterality: N/A;  . LAPAROSCOPIC CHOLECYSTECTOMY    . LEFT HEART CATH AND CORONARY ANGIOGRAPHY N/A 07/15/2017   Procedure: LEFT HEART CATH AND CORONARY ANGIOGRAPHY;  Surgeon: Tonny Bollmanooper,  Michael, MD;  [facility];  Service: Cardiovascular;  Laterality: N/A;  . LEFT HEART CATH AND CORONARY ANGIOGRAPHY N/A 03/12/2019   Procedure: LEFT HEART CATH AND CORONARY ANGIOGRAPHY;  Surgeon: Yvonne KendallEnd, Christopher, MD;  Location: [facility] INVASIVE CV LAB;  Service: Cardiovascular;  Laterality: N/A;      Social History:     Social History   Tobacco Use  . Smoking status: Former Smoker    Packs/day: 0.50    Years: 26.00    Pack years: 13.00    Types: Cigarettes    Quit date: 03/07/2019    Years since quitting: 0.0  . Smokeless tobacco: Never Used  Substance Use Topics  . Alcohol use: No     Lives - At home  Mobility -     Family History :     Family History  Problem Relation Age of Onset  . Heart disease Mother 2062       CABG  . Diabetes Mother   . Gout Mother   . Heart attack Father 4057      Home Medications:   Prior to Admission medications   Medication Sig Start Date End Date Taking? Authorizing Provider  ALPRAZolam Prudy Feeler(XANAX) 1 MG tablet Take 1 mg by mouth daily as needed for anxiety.   Yes [provider]  aspirin EC 81 MG tablet Take 81 mg by mouth daily.   Yes [provider]  atorvastatin (LIPITOR) 80 MG tablet Take 1 tablet (80 mg total) by mouth daily. Patient taking differently: Take 80 mg by mouth at bedtime.  09/01/16  Yes Standley BrookingGoodrich, Daniel P, MD  carvedilol (COREG) 6.25 MG tablet Take 1 tablet (6.25 mg total) by mouth 2 (two) times daily with a meal. 03/16/19  Yes Arty Baumgartneroberts, Lindsay B, NP  HUMALOG KWIKPEN 200 UNIT/ML SOPN INJECT 15-21 UNITS INTO THE SKIN THREE TIMES DAILY BEFORE MEALS 03/31/19  Yes Nida, Denman GeorgeGebreselassie W, MD  losartan (COZAAR) 100 MG tablet Take 1 tablet (100 mg total) by mouth daily. 03/17/19  Yes Arty Baumgartneroberts, Lindsay B, NP  metFORMIN (GLUCOPHAGE-XR) 500 MG 24 hr tablet Take 1 tablet (500 mg total) by mouth 2 (two) times daily. 01/11/19  Yes Nida, Denman GeorgeGebreselassie W, MD  Omega-3 1000 MG CAPS Take 1,000 mg by mouth 2 (two) times  daily.   Yes [provider]  ticagrelor (BRILINTA) 90 MG TABS tablet Take 1 tablet (90 mg total) by mouth 2 (two) times daily. 03/16/19  Yes Arty Baumgartneroberts, Lindsay B, NP  TRESIBA FLEXTOUCH 200 UNIT/ML SOPN INJECT 90 UNITS INTO THE SKIN AT BEDTIME Patient taking differently: Inject 90 Units into the skin at bedtime.  02/14/19  Yes  Cassandria Anger, MD  Insulin Pen Needle (PEN NEEDLES) 31G X 8 MM MISC 1 each by Does not apply route 4 (four) times daily. 01/11/19   Cassandria Anger, MD  nitroGLYCERIN (NITROSTAT) 0.4 MG SL tablet Place 1 tablet (0.4 mg total) under the tongue every 5 (five) minutes x 3 doses as needed for chest pain. 03/16/19   Cheryln Manly, NP     Allergies:     Allergies  Allergen Reactions  . Bee Venom Anaphylaxis and Swelling  . Penicillins Hives, Itching and Rash    Has patient had a PCN reaction causing immediate rash, facial/tongue/throat swelling, SOB or lightheadedness with hypotension: No Has patient had a PCN reaction causing severe rash involving mucus membranes or skin necrosis: No Has patient had a PCN reaction that required hospitalization: No Has patient had a PCN reaction occurring within the last 10 years: No If all of the above answers are "NO", then may proceed with Cephalosporin use.      Physical Exam:   Vitals  Blood pressure (!) 142/107, pulse (!) 133, temperature 97.9 F (36.6 C), temperature source Oral, resp. rate (!) 24, height 5\' 11"  (1.803 m), weight 131.1 kg, SpO2 97 %.   1. General developed male, laying in bed in no apparent distress  2. Normal affect and insight, Not Suicidal or Homicidal, Awake Alert, Oriented X 3.  3. No F.N deficits, ALL C.Nerves Intact, Strength 5/5 all 4 extremities, Sensation intact all 4 extremities, Plantars down going.  4. Ears and Eyes appear Normal, Conjunctivae clear, PERRLA. Moist Oral Mucosa.  5. Supple Neck, No JVD, No cervical lymphadenopathy appriciated, No Carotid Bruits.  6.  Symmetrical Chest wall movement, Good air movement bilaterally, CTAB.  7. Tachycardic, No Gallops, Rubs or Murmurs, No Parasternal Heave.  8. Positive Bowel Sounds, Abdomen Soft, No tenderness, No organomegaly appriciated,No rebound -guarding or rigidity.  9.  No Cyanosis, Normal Skin Turgor, No Skin Rash or Bruise.  10. Good muscle tone,  joints appear normal , no effusions, Normal ROM.  11. No Palpable Lymph Nodes in Neck or Axillae    Data Review:    CBC Recent Labs  Lab 04/03/19 1830  WBC 15.4*  HGB 14.6  HCT 43.1  PLT 251  MCV 89.0  MCH 30.2  MCHC 33.9  RDW 12.9   ------------------------------------------------------------------------------------------------------------------  Chemistries  Recent Labs  Lab 04/03/19 1830  NA 135  K 3.6  CL 102  CO2 23  GLUCOSE 232*  BUN 15  CREATININE 1.03  CALCIUM 8.9   ------------------------------------------------------------------------------------------------------------------ estimated creatinine clearance is 129 mL/min (by C-G formula based on SCr of 1.03 mg/dL). ------------------------------------------------------------------------------------------------------------------ No results for input(s): TSH, T4TOTAL, T3FREE, THYROIDAB in the last 72 hours.  Invalid input(s): FREET3  Coagulation profile No results for input(s): INR, PROTIME in the last 168 hours. ------------------------------------------------------------------------------------------------------------------- No results for input(s): DDIMER in the last 72 hours. -------------------------------------------------------------------------------------------------------------------  Cardiac Enzymes No results for input(s): CKMB, TROPONINI, MYOGLOBIN in the last 168 hours.  Invalid input(s): CK ------------------------------------------------------------------------------------------------------------------ No results found for: BNP    ---------------------------------------------------------------------------------------------------------------  Urinalysis    Component Value Date/Time   COLORURINE YELLOW 10/05/2017 [place] 10/05/2017 1550   LABSPEC 1.024 10/05/2017 1550   PHURINE 5.0 10/05/2017 1550   GLUCOSEU NEGATIVE 10/05/2017 [place] 10/05/2017 [place] 10/05/2017 [place] 10/05/2017 1550   PROTEINUR NEGATIVE 10/05/2017 1550   UROBILINOGEN 0.2 05/26/2014 0005   NITRITE NEGATIVE 10/05/2017 1550  LEUKOCYTESUR NEGATIVE 10/05/2017 1550    ----------------------------------------------------------------------------------------------------------------   Imaging Results:    Dg Chest Port 1 View  Result Date: 04/03/2019 CLINICAL DATA:  Heart began racing 30 minutes ago, history coronary artery disease, type II diabetes mellitus, hypertension, GERD, SVT EXAM: PORTABLE CHEST 1 VIEW COMPARISON:  Portable exam 1904 hours compared to 03/12/2019 FINDINGS: Enlargement of cardiac silhouette. Mediastinal contours and pulmonary vascularity normal. Lungs clear. No infiltrate, pleural effusion or pneumothorax. Minimal central peribronchial thickening. IMPRESSION: Enlargement of cardiac silhouette. Minimal bronchitic changes without infiltrate. Electronically Signed   By: Ulyses SouthwardMark  Boles M.D.   On: 04/03/2019 19:19    My personal review of EKG: Junctional rhythm, Rate  136 /min, QTc 480 , no Acute ST changes   Assessment & Plan:    Active Problems:   Essential hypertension, benign   Type 2 diabetes mellitus, uncontrolled (HCC)   Mixed hyperlipidemia   Family history of early CAD   Severe obesity with body mass index (BMI) of 35.0 to 39.9 with comorbidity (HCC)   CAD (coronary artery disease)   S/P drug eluting coronary stent placement   Atrial tachycardia (HCC)   Arrhythmias -Patient presents with palpitation, per my discussion with cardiology at [facility], this is most likely atrial tachycardia,less  likely nontypical a flutter, giving adenosine x2, Coreg while in ED with no improvement, recommendation is to start on Cardizem drip overnight, to see if converts to normal sinus rhythm, or if heart rate slows further for better identification of rhythm - patient denies any chest pain, discussed EKG findings and presentation with cardiology, no indication for ischemic  work-up at this point. No need for high sensitivity troponin. -will Check TSH -Official cardiology consult in a.m. for further management -We will replete potassium to keep more than 4, will check magnesium level  CAD -With recent STEMI involving the LAD and CFX/OM1 that is [facility] cardiac [facility] Admitted 06/06-06/07/2019 for STEMI s/p DES pLAD (distal LAD embolization, med rx), PTCA CFX/OM1 bifurcation 90%>>60%, EF 40-45% Echo, A1c 7.7%,  -Patient currently denies any chest pain -Continue with home medication including aspirin, Brilinta, high-dose statin, beta-blockers and ARB  Diabetes mellitus -Hold Humalog sliding scale here, will substitute with insulin sliding scale, will hold metformin, will reduce Tresiba dose from 90 to 70 units . -A1c on recent admission was 7.7 .  Hypertension  -resume Doing home medication, blood pressure initially elevated but improved with Cardizem drip .  Hyperlipidemia -Continue with home medication   obesity -BMI of 40  regimen including Humalog before meals, [place], and  DVT Prophylaxis   Lovenox - SCDs  AM Labs Ordered, also please review Full Orders  Family Communication: Admission, patients condition and plan of care including tests being ordered have been discussed with the patient who indicate understanding and agree with the plan and Code Status.  Code Status full  Likely DC to  Home  Condition GUARDED    Consults called: [facility]  Admission status: Observation  Time spent in  minutes : 60 minutes   Huey Bienenstockawood Halea Lieb M.D on 04/03/2019 at 10:10 PM  Between 7am to 7pm - Pager - [phone]. After 7pm go to [email or web address] - password [email or web address]  [facility] - Office  [phone]

## 2019-04-04 DIAGNOSIS — I2510 Atherosclerotic heart disease of native coronary artery without angina pectoris: Secondary | ICD-10-CM

## 2019-04-04 DIAGNOSIS — I25119 Atherosclerotic heart disease of native coronary artery with unspecified angina pectoris: Secondary | ICD-10-CM

## 2019-04-04 DIAGNOSIS — I471 Supraventricular tachycardia: Secondary | ICD-10-CM | POA: Diagnosis not present

## 2019-04-04 DIAGNOSIS — I255 Ischemic cardiomyopathy: Secondary | ICD-10-CM

## 2019-04-04 DIAGNOSIS — I10 Essential (primary) hypertension: Secondary | ICD-10-CM | POA: Diagnosis not present

## 2019-04-04 DIAGNOSIS — I248 Other forms of acute ischemic heart disease: Secondary | ICD-10-CM | POA: Diagnosis not present

## 2019-04-04 LAB — TSH: TSH: 3.43 u[IU]/mL (ref 0.350–4.500)

## 2019-04-04 LAB — CBC
HCT: 41.6 % (ref 39.0–52.0)
Hemoglobin: 13.8 g/dL (ref 13.0–17.0)
MCH: 29.6 pg (ref 26.0–34.0)
MCHC: 33.2 g/dL (ref 30.0–36.0)
MCV: 89.1 fL (ref 80.0–100.0)
Platelets: 196 10*3/uL (ref 150–400)
RBC: 4.67 MIL/uL (ref 4.22–5.81)
RDW: 13.1 % (ref 11.5–15.5)
WBC: 11.9 10*3/uL — ABNORMAL HIGH (ref 4.0–10.5)
nRBC: 0 % (ref 0.0–0.2)

## 2019-04-04 LAB — COMPREHENSIVE METABOLIC PANEL
ALT: 31 U/L (ref 0–44)
AST: 18 U/L (ref 15–41)
Albumin: 3.7 g/dL (ref 3.5–5.0)
Alkaline Phosphatase: 90 U/L (ref 38–126)
Anion gap: 10 (ref 5–15)
BUN: 15 mg/dL (ref 6–20)
CO2: 24 mmol/L (ref 22–32)
Calcium: 8.9 mg/dL (ref 8.9–10.3)
Chloride: 107 mmol/L (ref 98–111)
Creatinine, Ser: 0.92 mg/dL (ref 0.61–1.24)
GFR calc Af Amer: >60 mL/min (ref >60)
GFR calc non Af Amer: >60 mL/min (ref >60)
Glucose, Bld: 232 mg/dL — ABNORMAL HIGH (ref 70–99)
Potassium: 3.9 mmol/L (ref 3.5–5.1)
Sodium: 141 mmol/L (ref 135–145)
Total Bilirubin: 0.7 mg/dL (ref 0.3–1.2)
Total Protein: 6.4 g/dL — ABNORMAL LOW (ref 6.5–8.1)

## 2019-04-04 LAB — GLUCOSE, CAPILLARY
Glucose-Capillary: 114 mg/dL — ABNORMAL HIGH (ref 70–99)
Glucose-Capillary: 124 mg/dL — ABNORMAL HIGH (ref 70–99)
Glucose-Capillary: 147 mg/dL — ABNORMAL HIGH (ref 70–99)
Glucose-Capillary: 148 mg/dL — ABNORMAL HIGH (ref 70–99)
Glucose-Capillary: 184 mg/dL — ABNORMAL HIGH (ref 70–99)

## 2019-04-04 LAB — TROPONIN I (HIGH SENSITIVITY)
Troponin I (High Sensitivity): 45 ng/L — ABNORMAL HIGH (ref <18)
Troponin I (High Sensitivity): 60 ng/L — ABNORMAL HIGH (ref <18)
Troponin I (High Sensitivity): 59 ng/L — ABNORMAL HIGH (ref <18)

## 2019-04-04 LAB — MRSA PCR SCREENING: MRSA by PCR: NEGATIVE

## 2019-04-04 MED ORDER — CARVEDILOL 12.5 MG PO TABS: 12.5000 mg | ORAL_TABLET | Freq: Two times a day (BID) | ORAL | Status: DC

## 2019-04-04 MED ORDER — LOSARTAN POTASSIUM 50 MG PO TABS
50.0000 mg | ORAL_TABLET | Freq: Every day | ORAL | Status: DC
  Administered 2019-04-04 – 2019-04-05 (×2): 50 mg via ORAL
  Filled 2019-04-04 (×2): qty 1

## 2019-04-04 MED ORDER — CARVEDILOL 3.125 MG PO TABS
3.1250 mg | ORAL_TABLET | Freq: Two times a day (BID) | ORAL | Status: DC
  Administered 2019-04-04 – 2019-04-05 (×2): 3.125 mg via ORAL
  Filled 2019-04-04 (×2): qty 1

## 2019-04-04 MED ORDER — DILTIAZEM HCL 30 MG PO TABS
30.0000 mg | ORAL_TABLET | Freq: Four times a day (QID) | ORAL | Status: DC
  Administered 2019-04-04 – 2019-04-05 (×4): 30 mg via ORAL
  Filled 2019-04-04 (×5): qty 1

## 2019-04-04 NOTE — Progress Notes (Signed)
Patient Demographics:    Lance [name], is a 43 y.o. male, DOB - 04/19/1976, ZOX:[id number]  Admit date - 04/03/2019   Admitting Physician Starleen Armsawood S Elgergawy, MD  Outpatient Primary MD for the patient is [facility]  LOS - 0   Chief Complaint  Patient presents with  . Chest Pain        Subjective:    Lance [name] today has no fevers, no emesis,  No chest pain, no significant shortness of breath,  Assessment  & Plan :    Active Problems:   Essential hypertension, benign   Type 2 diabetes mellitus, uncontrolled (HCC)   Mixed hyperlipidemia   Family history of early CAD   Severe obesity with body mass index (BMI) of 35.0 to 39.9 with comorbidity (HCC)   CAD (coronary artery disease)   S/P drug eluting coronary stent placement   Atrial tachycardia [facility](HCC)  Brief Summary: 43 year old male patient with history of PCI to the circumflex/OM and RCA in 2018, hypertension, hyperlipidemia, type II DM, obesity who  had STEMI with VF arrest 03/12/2019 treated with successful defibrillation and DES to the LAD with distal emboli treated with transient Aggrastat.  Also underwent PTCA on restenosed OM from 90 down to 60% EF 40 to 45%-readmitted 04/03/2019 with chest pains patient tells me he has been compliant with his medications and he has since quit smoking.    A/p  1)Atrial Tachycardia in a patient with history of PSVT----??? AVRT, cardiology input appreciated, patient appears to be back in NSR... He does not tolerate high-dose beta-blocker well, per Dr. Diona BrownerMcDowell okay to treat empirically with Coreg 3.125 mg twice daily, Cardizem 30 mg every 6 hours (aware of ischemic cardiomyopathy with systolic dysfunction ), if tolerates Cardizem plan is to transition to Cardizem CD 120 mg daily starting 04/05/2019... He will probably need EP evaluation as outpatient.   2)CAD--status post V. fib arrest and STEMI on  03/08/2019----that is post angioplasty and stenting with distal embolization of PTCA as outlined above--- troponins are flat, not consistent with ACS.... Suspect some degree of demand ischemia in the setting of tachycardia, and aspirin 81 mg daily, and Brilinta, c/n  Lipitor 80 mg, Coreg 3.125 mg bid,   3)DM2- --- recent A1c 7.7, Lantus insulin 70 units nightly along with sliding scale coverage  4)HTN--- blood pressures have been soft, losartan has been decreased to allow for titration of Cardizem and Coreg  5) obesity/possible sleep apnea--patient had sleep study on 04/01/2019, awaiting results..... May use CPAP nightly while here  6) history of tobacco abuse--- just quit smoking in June 2020 congratulated, encouraged to stay abstinent from tobacco  7) ischemic cardiomyopathy with EF in the 40 to 45% range per echo from 03/13/2019----no acute CHF flareup at this time, appears euvolemic, please see discussion in #1 above regarding reluctant Cardizem use... Losartan has been reduced to 50 mg daily due to BP concerns  Disposition/Need for in-Hospital Stay- patient unable to be discharged at this time due to adjustment of cardiac medications, soft BP and tachycardia concerns--- cardiology team if patient tolerates medication adjustments well possible discharge on 04/05/2019 on Cardizem CD  Code Status : full  Family Communication:   NA (patient is alert, awake and coherent)   Disposition Plan  :  Home in a.m.  Consults  : Cardiology  DVT Prophylaxis  :  Lovenox -SCDs   Lab Results  Component Value Date   PLT 196 04/04/2019    Inpatient Medications  Scheduled Meds: . aspirin EC  81 mg Oral Daily  . atorvastatin  80 mg Oral QHS  . carvedilol  3.125 mg Oral BID WC  . Chlorhexidine Gluconate Cloth  6 each Topical Q0600  . diltiazem  30 mg Oral Q6H  . enoxaparin (LOVENOX) injection  40 mg Subcutaneous Q24H  . insulin aspart  0-20 Units Subcutaneous TID WC  . insulin aspart  0-5 Units  Subcutaneous QHS  . insulin glargine  70 Units Subcutaneous QHS  . losartan  50 mg Oral Daily  . omega-3 acid ethyl esters  1,000 mg Oral BID  . ticagrelor  90 mg Oral BID   Continuous Infusions: . sodium chloride 75 mL/hr at 04/04/19 0909   PRN Meds:.ALPRAZolam, nitroGLYCERIN    Anti-infectives (From admission, onward)   None        Objective:   Vitals:   04/04/19 0700 04/04/19 0735 04/04/19 0800 04/04/19 0900  BP: 136/74  128/65 136/67  Pulse: 65 65 72 65  Resp: 17 18 18 18   Temp:  (!) 97.5 F (36.4 C)    TempSrc:  Oral    SpO2: 98% 99% 98% 98%  Weight:      Height:        Wt Readings from Last 3 Encounters:  04/04/19 128.8 kg  03/29/19 131.4 kg  03/16/19 125.5 kg     Intake/Output Summary (Last 24 hours) at 04/04/2019 1114 Last data filed at 04/04/2019 0500 Gross per 24 hour  Intake 733.85 ml  Output 600 ml  Net 133.85 ml     Physical Exam  Gen:- Awake Alert,  Obese, no apparent distress  HEENT:- [place].AT, No sclera icterus Neck-Supple Neck,No JVD,.  Lungs-  CTAB , fair symmetrical air movement CV- S1, S2 normal, regular  Abd-  +ve B.Sounds, Abd Soft, No tenderness, increased truncal adiposity Extremity/Skin:- No  edema, pedal pulses present  Psych-affect is appropriate, oriented x3 Neuro-no new focal deficits, no tremors   Data Review:   Micro Results Recent Results (from the past 240 hour(s))  SARS Coronavirus 2 (CEPHEID - Performed in [facility] lab), Hosp Order     Status: None   Collection Time: 04/03/19  9:19 PM   Specimen: Nasopharyngeal Swab  Result Value Ref Range Status   SARS Coronavirus 2 NEGATIVE NEGATIVE Final    Comment: (NOTE) If result is NEGATIVE SARS-CoV-2 target nucleic acids are NOT DETECTED. The SARS-CoV-2 RNA is generally detectable in upper and lower  respiratory specimens during the acute phase of infection. The lowest  concentration of SARS-CoV-2 viral copies this assay can detect is 250  copies / mL. A  negative result does not preclude SARS-CoV-2 infection  and should not be used as the sole basis for treatment or other  patient management decisions.  A negative result may occur with  improper specimen collection / handling, submission of specimen other  than nasopharyngeal swab, presence of viral mutation(s) within the  areas targeted by this assay, and inadequate number of viral copies  (<250 copies / mL). A negative result must be combined with clinical  observations, patient history, and epidemiological information. If result is POSITIVE SARS-CoV-2 target nucleic acids are DETECTED. The SARS-CoV-2 RNA is generally detectable in upper and lower  respiratory specimens dur ing the acute phase of  infection.  Positive  results are indicative of active infection with SARS-CoV-2.  Clinical  correlation with patient history and other diagnostic information is  necessary to determine patient infection status.  Positive results do  not rule out bacterial infection or co-infection with other viruses. If result is PRESUMPTIVE POSTIVE SARS-CoV-2 nucleic acids MAY BE PRESENT.   A presumptive positive result was obtained on the submitted specimen  and confirmed on repeat testing.  While 2019 novel coronavirus  (SARS-CoV-2) nucleic acids may be present in the submitted sample  additional confirmatory testing may be necessary for epidemiological  and / or clinical management purposes  to differentiate between  SARS-CoV-2 and other Sarbecovirus currently known to infect humans.  If clinically indicated additional testing with an alternate test  methodology [phone]([id number]) is advised. The SARS-CoV-2 RNA is generally  detectable in upper and lower respiratory sp ecimens during the acute  phase of infection. The expected result is Negative. Fact Sheet for Patients:  BoilerBrush.com.cyhttps://www.fda.gov/media/136312/download Fact Sheet for Healthcare Providers: https://pope.com/https://www.fda.gov/media/136313/download This test is not  yet approved or cleared by the [place] FDA and has been authorized for detection and/or diagnosis of SARS-CoV-2 by FDA under an Emergency Use Authorization (EUA).  This EUA will remain in effect (meaning this test can be used) for the duration of the COVID-19 declaration under Section 564(b)(1) of the Act, 21 U.S.C. section 360bbb-3(b)(1), unless the authorization is terminated or revoked sooner. Performed at [facility], [address] [place], [place] [address]   MRSA PCR Screening     Status: None   Collection Time: 04/03/19 11:38 PM   Specimen: Nasal Mucosa; Nasopharyngeal  Result Value Ref Range Status   MRSA by PCR NEGATIVE NEGATIVE Final    Comment:        The GeneXpert MRSA Assay (FDA approved for NASAL specimens only), is one component of a comprehensive MRSA colonization surveillance program. It is not intended to diagnose MRSA infection nor to guide or monitor treatment for MRSA infections. Performed at [facility], [address] [place], [place] [address]     Radiology Reports Dg Chest [place]  Result Date: 04/03/2019 CLINICAL DATA:  Heart began racing 30 minutes ago, history coronary artery disease, type II diabetes mellitus, hypertension, GERD, SVT EXAM: PORTABLE CHEST 1 VIEW COMPARISON:  Portable exam 1904 hours compared to 03/12/2019 FINDINGS: Enlargement of cardiac silhouette. Mediastinal contours and pulmonary vascularity normal. Lungs clear. No infiltrate, pleural effusion or pneumothorax. Minimal central peribronchial thickening. IMPRESSION: Enlargement of cardiac silhouette. Minimal bronchitic changes without infiltrate. Electronically Signed   By: Ulyses SouthwardMark  Boles M.D.   On: 04/03/2019 19:19   Dg Chest Portable 1 View  Result Date: 03/12/2019 CLINICAL DATA:  43 year old male with history of ST-elevation myocardial infarction. EXAM: PORTABLE CHEST 1 VIEW COMPARISON:  Chest x-ray 02/27/2018. FINDINGS: Transcutaneous defibrillator pads project over the  left hemithorax. Lung volumes are low. Patient is in a very lordotic position. Lung volumes are low. No consolidative airspace disease. No pleural effusions. No pneumothorax. No pulmonary nodule or mass noted. Pulmonary vasculature and the cardiomediastinal silhouette are within normal limits. IMPRESSION: 1. Low lung volumes without radiographic evidence of acute cardiopulmonary disease. Electronically Signed   By: Trudie Reedaniel  Entrikin M.D.   On: 03/12/2019 19:12     CBC Recent Labs  Lab 04/03/19 1830 04/04/19 0208  WBC 15.4* 11.9*  HGB 14.6 13.8  HCT 43.1 41.6  PLT 251 196  MCV 89.0 89.1  MCH 30.2 29.6  MCHC 33.9 33.2  RDW 12.9  13.1    Chemistries  Recent Labs  Lab 04/03/19 1830 04/04/19 0208  NA 135 141  K 3.6 3.9  CL 102 107  CO2 23 24  GLUCOSE 232* 232*  BUN 15 15  CREATININE 1.03 0.92  CALCIUM 8.9 8.9  MG 1.9  --   AST  --  18  ALT  --  31  ALKPHOS  --  90  BILITOT  --  0.7   ------------------------------------------------------------------------------------------------------------------ No results for input(s): CHOL, HDL, LDLCALC, TRIG, CHOLHDL, LDLDIRECT in the last 72 hours.  Lab Results  Component Value Date   HGBA1C 7.7 (H) 03/13/2019   ------------------------------------------------------------------------------------------------------------------ Recent Labs    04/03/19 2320  TSH 3.430   ------------------------------------------------------------------------------------------------------------------ No results for input(s): VITAMINB12, FOLATE, FERRITIN, TIBC, IRON, RETICCTPCT in the last 72 hours.  Coagulation profile No results for input(s): INR, PROTIME in the last 168 hours.  No results for input(s): DDIMER in the last 72 hours.  Cardiac Enzymes No results for input(s): CKMB, TROPONINI, MYOGLOBIN in the last 168 hours.  Invalid input(s): CK  ------------------------------------------------------------------------------------------------------------------ No results found for: BNP   Roxan Hockey M.D on 04/04/2019 at 11:14 AM  Go to [email or web address] - for contact info  [facility] - Office  [phone]

## 2019-04-04 NOTE — Progress Notes (Signed)
Contacted MD in regards to the HS troponin of 45. MD will continue to monitor and cycle labs.

## 2019-04-04 NOTE — Consult Note (Addendum)
Cardiology Consultation:   Patient ID: BRACH BIRDSALL MRN: [id number]; DOB: 01-May-1976  Admit date: 04/03/2019 Date of Consult: 04/04/2019  Primary Care Provider: [facility], [facility] Primary Cardiologist: Nona Dell, MD  Primary Electrophysiologist:  None    Patient Profile:   Lance [name] is a 43 y.o. male with a hx of CAD who is being seen today for the evaluation of junctional arrhythmia and elevated troponins at the request of Dr. Marlane Mingle.  History of Present Illness:   Lance [name] is a 43 year old male patient with history of PCI to the circumflex/OM and RCA in 2018, hypertension, hyperlipidemia, type II DM, obesity.  Patient had STEMI with VF arrest 03/12/2019 treated with successful defibrillation and DES to the LAD with distal emboli treated with transient Aggrastat.  Also underwent PTCA on restenosed OM from 90 down to 60% EF 40 to 45%.  If patient has refractory chest pain complex bifurcation intervention to previously stented circumflex/OM1 and AV groove circumflex could be considered.     Patient had office visit with Lance Demark, PA-C 03/29/2019 at which time he was complaining of palpitations that were similar to his SVT in the past.  With still smoking and hemoglobin A1c was 7.7.  Patient came into the emergency room last night complaining of palpitations that lasted 30 minutes when he laid down.  EKG in the ER was felt to be atrial tachycardia treated with adenosine 6 mg x 2 with no conversion. Has been in NSR on diltiazem drip ever since. Patient says it feels just like his SVT in the past. No chest pain or dizziness associated with it. Had sleep study Fri.  Heart Pathway Score:     Past Medical History:  Diagnosis Date   CAD (coronary artery disease), native coronary artery    10/18 PCI/DES to mRCA, and OM, with total occlusion of dLCx with collaterals.    Essential hypertension    GERD (gastroesophageal reflux disease)    History of kidney stones     Hyperlipemia    SVT (supraventricular tachycardia) (HCC) 2015   Converted with adenosine   Type 2 diabetes mellitus (HCC)     Past Surgical History:  Procedure Laterality Date   CARDIAC SURGERY     CORONARY ANGIOPLASTY WITH STENT PLACEMENT  07/15/2017   "2 stents"   CORONARY/GRAFT ACUTE MI REVASCULARIZATION N/A 03/12/2019   Procedure: Coronary/Graft Acute MI Revascularization;  Surgeon: Yvonne Kendall, MD;  Location: [facility] INVASIVE CV LAB;  Service: Cardiovascular;  Laterality: N/A;   LAPAROSCOPIC APPENDECTOMY N/A 11/12/2017   Procedure: APPENDECTOMY LAPAROSCOPIC, REPAIR OF INCARCERATED INCISIONAL HERNIA;  Surgeon: Karie Soda, MD;  Location: [facility] ORS;  Service: General;  Laterality: N/A;   LAPAROSCOPIC CHOLECYSTECTOMY     LEFT HEART CATH AND CORONARY ANGIOGRAPHY N/A 07/15/2017   Procedure: LEFT HEART CATH AND CORONARY ANGIOGRAPHY;  Surgeon: Tonny Bollman, MD;  Location[facility] INV[facility]IVE CV LAB;  Service: Cardiovascular;  Laterality: N/A;   LEFT HEART CATH AND CORONARY ANGIOGRAPHY N/A 03/12/2019   Procedure: LEFT HEART CATH AND CORONARY ANGIOGRAPHY;  Surgeon: Yvonne Kendall, MD;  Location: [facility] INVASIVE CV LAB;  Service: Cardiovascular;  Laterality: N/A;     Home Medications:  Prior to Admission medications   Medication Sig Start Date End Date Taking? Authorizing Provider  ALPRAZolam Prudy Feeler) 1 MG tablet Take 1 mg by mouth daily as needed for anxiety.   Yes [provider]  aspirin EC 81 MG tablet Take 81 mg by mouth daily.   Yes [provider]  atorvastatin (  LIPITOR) 80 MG tablet Take 1 tablet (80 mg total) by mouth daily. Patient taking differently: Take 80 mg by mouth at bedtime.  09/01/16  Yes Standley BrookingGoodrich, Daniel P, MD  carvedilol (COREG) 6.25 MG tablet Take 1 tablet (6.25 mg total) by mouth 2 (two) times daily with a meal. 03/16/19  Yes Arty Baumgartneroberts, Lindsay B, NP  HUMALOG KWIKPEN 200 UNIT/ML SOPN INJECT 15-21 UNITS INTO THE SKIN THREE TIMES DAILY BEFORE MEALS 03/31/19  Yes  Nida, Denman GeorgeGebreselassie W, MD  losartan (COZAAR) 100 MG tablet Take 1 tablet (100 mg total) by mouth daily. 03/17/19  Yes Arty Baumgartneroberts, Lindsay B, NP  metFORMIN (GLUCOPHAGE-XR) 500 MG 24 hr tablet Take 1 tablet (500 mg total) by mouth 2 (two) times daily. 01/11/19  Yes Nida, Denman GeorgeGebreselassie W, MD  Omega-3 1000 MG CAPS Take 1,000 mg by mouth 2 (two) times daily.   Yes [provider]  ticagrelor (BRILINTA) 90 MG TABS tablet Take 1 tablet (90 mg total) by mouth 2 (two) times daily. 03/16/19  Yes Arty Baumgartneroberts, Lindsay B, NP  TRESIBA FLEXTOUCH 200 UNIT/ML SOPN INJECT 90 UNITS INTO THE SKIN AT BEDTIME Patient taking differently: Inject 90 Units into the skin at bedtime.  02/14/19  Yes Nida, Denman GeorgeGebreselassie W, MD  Insulin Pen Needle (PEN NEEDLES) 31G X 8 MM MISC 1 each by Does not apply route 4 (four) times daily. 01/11/19   Roma KayserNida, Gebreselassie W, MD  nitroGLYCERIN (NITROSTAT) 0.4 MG SL tablet Place 1 tablet (0.4 mg total) under the tongue every 5 (five) minutes x 3 doses as needed for chest pain. 03/16/19   Arty Baumgartneroberts, Lindsay B, NP    Inpatient Medications: Scheduled Meds:  aspirin EC  81 mg Oral Daily   atorvastatin  80 mg Oral QHS   carvedilol  6.25 mg Oral BID WC   Chlorhexidine Gluconate Cloth  6 each Topical Q0600   enoxaparin (LOVENOX) injection  40 mg Subcutaneous Q24H   insulin aspart  0-20 Units Subcutaneous TID WC   insulin aspart  0-5 Units Subcutaneous QHS   insulin glargine  70 Units Subcutaneous QHS   losartan  100 mg Oral Daily   omega-3 acid ethyl esters  1,000 mg Oral BID   ticagrelor  90 mg Oral BID   Continuous Infusions:  sodium chloride Stopped (04/03/19 2102)   diltiazem (CARDIZEM) infusion Stopped (04/04/19 0524)   PRN Meds: ALPRAZolam, nitroGLYCERIN  Allergies:    Allergies  Allergen Reactions   Bee Venom Anaphylaxis and Swelling   Penicillins Hives, Itching and Rash    Has patient had a PCN reaction causing immediate rash, facial/tongue/throat swelling, SOB or  lightheadedness with hypotension: No Has patient had a PCN reaction causing severe rash involving mucus membranes or skin necrosis: No Has patient had a PCN reaction that required hospitalization: No Has patient had a PCN reaction occurring within the last 10 years: No If all of the above answers are "NO", then may proceed with Cephalosporin use.     Social History:   Social History   Socioeconomic History   Marital status: Married    Spouse name: Not on file   Number of children: Not on file   Years of education: Not on file   Highest education level: Not on file  Occupational History   Occupation: Superintendent    Employer: [employer]  Social Needs   Financial resource strain: Not on file   Food insecurity    Worry: Not on file    Inability: Not on file   Transportation  needs    Medical: Not on file    Non-medical: Not on file  Tobacco Use   Smoking status: Former Smoker    Packs/day: 0.50    Years: 26.00    Pack years: 13.00    Types: Cigarettes    Quit date: 03/07/2019    Years since quitting: 0.0   Smokeless tobacco: Never Used  Substance and Sexual Activity   Alcohol use: No   Drug use: No   Sexual activity: Yes    Birth control/protection: None  Lifestyle   Physical activity    Days per week: Not on file    Minutes per session: Not on file   Stress: Not on file  Relationships   Social connections    Talks on phone: Not on file    Gets together: Not on file    Attends religious service: Not on file    Active member of club or organization: Not on file    Attends meetings of clubs or organizations: Not on file    Relationship status: Not on file   Intimate partner violence    Fear of current or ex partner: Not on file    Emotionally abused: Not on file    Physically abused: Not on file    Forced sexual activity: Not on file  Other Topics Concern   Not on file  Social History Narrative   Not on file    Family History:     Family History  Problem Relation Age of Onset   Heart disease Mother 47       CABG   Diabetes Mother    Gout Mother    Heart attack Father 25     ROS:  Please see the history of present illness.  Review of Systems  Constitution: Negative.  HENT: Negative.   Cardiovascular: Positive for palpitations.  Respiratory: Positive for snoring.   Endocrine: Negative.   Hematologic/Lymphatic: Negative.   Musculoskeletal: Negative.   Gastrointestinal: Negative.   Genitourinary: Negative.   Neurological: Negative.    All other ROS reviewed and negative.     Physical Exam/Data:   Vitals:   04/04/19 0530 04/04/19 0600 04/04/19 0630 04/04/19 0735  BP: 135/68 (!) 103/52 135/75   Pulse: 70 66 72 65  Resp: 16 12 (!) 22 18  Temp:    (!) 97.5 F (36.4 C)  TempSrc:    Oral  SpO2: 98% 97% 99% 99%  Weight:      Height:        Intake/Output Summary (Last 24 hours) at 04/04/2019 0756 Last data filed at 04/04/2019 0500 Gross per 24 hour  Intake 733.85 ml  Output 600 ml  Net 133.85 ml   Last 3 Weights 04/04/2019 04/03/2019 04/03/2019  Weight (lbs) 283 lb 15.2 oz 283 lb 15.2 oz 289 lb  Weight (kg) 128.8 kg 128.8 kg 131.09 kg     Body mass index is 39.6 kg/m.  General:  Obese, in no acute distress  HEENT: normal Lymph: no adenopathy Neck: no JVD Endocrine:  No thryomegaly Vascular: No carotid bruits; FA pulses 2+ bilaterally without bruits  Cardiac:  normal S1, S2; RRR; no murmur distant HS Lungs: Decreased breath sounds but  clear to auscultation bilaterally, no wheezing, rhonchi or rales  Abd: soft, nontender, no hepatomegaly  Ext: no edema Musculoskeletal:  No deformities, BUE and BLE strength normal and equal Skin: warm and dry  Neuro:  CNs 2-12 intact, no focal abnormalities noted Psych:  Normal affect  EKG:  The EKG was personally reviewed and demonstrates: Atrial tachycardia at 136 bpm previous anterior septal wall MI Telemetry:  Telemetry was personally reviewed and  demonstrates:  NSR  Relevant CV Studies:  ECHO: 03/13/2019  1. Severe akinesis of the left ventricular, apical anteroseptal wall.  2. The left ventricle has mild-moderately reduced systolic function, with an ejection fraction of 40-45%. The cavity size was normal. Left ventricular diastolic Doppler parameters are consistent with pseudonormalization.  3. The right ventricle has normal systolic function. The cavity was normal. There is no increase in right ventricular wall thickness.  4. No stenosis of the aortic valve.  5. The aortic root and ascending aorta are normal in size and structure.  6. The interatrial septum was not assessed.     CATH: 03/12/19   Conclusions: 1. Severe two-vessel CAD with 90% stenosis of proximal LAD just beyond D1 with thrombos (some of which had embolized to the apical LAD) and 99% in-stent restenosis of OM1 and 90% stenosis of jailed mid LCx. 2. Mild in-stent restenosis involving the mid RCA. 3. Moderately elevated left ventricular filling pressure with suggestion of anterior hypokinesis on the left ventriculogram (suboptimal opacification with hand-injection). 4. Successful PCI to proximal LAD using Synergy 3.0 x 2 mm DES (post-dilated proximally to 4.0 mm) with 0% residual stenosis and TIMI-3 flow. 5. Success balloon angioplasty to LCx/OM1 stent using [place] Euphora 2.25 x 8 mm and Sapphire 2.5 x 12 mm balloons with reduction in stenosis of OM1 from 99% to 40% and mid LCx from 90% to 60% and restoration of TIMI-3 flow.  Given improving chest pain, relatively small vessel size with need for complex bifurcation intervention, and radiation/contrast doses administered to that point, decision was made to forego additional intervention to this vessel.   Recommendations: 1. Continue tirofiban infusion x 18 hours. 2. Dual antiplatelet therapy with aspirin and ticagrelor for at least 12 months, ideally longer. 3. Aggressive secondary prevention. 4. Obtain transthoracic  echocardiogram to assess LV function. 5. If patient has refractory chest pain, complex bifurcation intervention to previously stented LCx/OM1 and AV groove LCx could be considered, though I worry about restenosis in the future given aggressive restenosis since prior PCI in 2018, relatively small vessel size, and likely need for culotte stenting of the LCx.   Yvonne Kendallhristopher End, MD [facility]     Laboratory Data:  High Sensitivity Troponin:   Recent Labs  Lab 04/03/19 2320 04/04/19 0208  TROPONINIHS 45.00* 60.00*     Cardiac EnzymesNo results for input(s): TROPONINI in the last 168 hours. No results for input(s): TROPIPOC in the last 168 hours.  Chemistry Recent Labs  Lab 04/03/19 1830 04/04/19 0208  NA 135 141  K 3.6 3.9  CL 102 107  CO2 23 24  GLUCOSE 232* 232*  BUN 15 15  CREATININE 1.03 0.92  CALCIUM 8.9 8.9  GFRNONAA >60 >60  GFRAA >60 >60  ANIONGAP 10 10    Recent Labs  Lab 04/04/19 0208  PROT 6.4*  ALBUMIN 3.7  AST 18  ALT 31  ALKPHOS 90  BILITOT 0.7   Hematology Recent Labs  Lab 04/03/19 1830 04/04/19 0208  WBC 15.4* 11.9*  RBC 4.84 4.67  HGB 14.6 13.8  HCT 43.1 41.6  MCV 89.0 89.1  MCH 30.2 29.6  MCHC 33.9 33.2  RDW 12.9 13.1  PLT 251 196   BNPNo results for input(s): BNP, PROBNP in the last 168 hours.  DDimer No results for input(s): DDIMER in the last 168  hours.   Radiology/Studies:  Dg Chest Port 1 View  Result Date: 04/03/2019 CLINICAL DATA:  Heart began racing 30 minutes ago, history coronary artery disease, type II diabetes mellitus, hypertension, GERD, SVT EXAM: PORTABLE CHEST 1 VIEW COMPARISON:  Portable exam 1904 hours compared to 03/12/2019 FINDINGS: Enlargement of cardiac silhouette. Mediastinal contours and pulmonary vascularity normal. Lungs clear. No infiltrate, pleural effusion or pneumothorax. Minimal central peribronchial thickening. IMPRESSION: Enlargement of cardiac silhouette. Minimal bronchitic changes without  infiltrate. Electronically Signed   By: Lavonia Dana M.D.   On: 04/03/2019 19:19    Assessment and Plan:   1. Atrial tachycardia with history of SVT-now in NSR. On coreg 6.25 mg BID and not titrated b/c of low BP. Could decrease losartan to 50 mg daily to titrate coreg. May need to see EPS with prior history of SVT.(his nephew just had an ablation for the same) 2. CAD status post V. fib arrest and STEMI 03/08/2019 treated with stenting to the LAD with distal embolization and PTCA of the circumflex/OM1 bifurcation reducing it from 90% down to 60%-if refractory pain recommend possible intervention-no chest pain-troponins flat. 3. History of SVT 4. Ischemic cardiomyopathy ejection fraction 40 to 45% on echo 03/13/2019-no CHF 5. Tobacco abuse-quit smoking 6. Possible sleep apnea had sleep study Friday-awaiting results 7. Diabetes mellitus type 2 hemoglobin A1c 7.7 8. Hypertension BP runs low-decrease losartan and titrate coreg. 9. Hyperlipidemia-on lipitor.  For questions or updates, please contact [place] Please consult [email or web address] for contact info under    Signed, Ermalinda Barrios, PA-C  04/04/2019 7:56 AM   Attending note:  Patient seen and examined.  I reviewed extensive records including his recent hospital stay and follow-up visit.  He is status post recent anterior STEMI complicated by VF arrest in early June, managed with DES to the LAD with distal embolization treated with Aggrastat.  He also underwent PTCA of an OM restenosis with satisfactory results but moderate residual stenosis.  LVEF 40 to 45% range.  He now presents with complaint of sudden onset palpitations similar to prior PSVT.  He was treated with adenosine in the ER without improvement and was ultimately placed on intravenous diltiazem with conversion to sinus rhythm.  Review of the ECG during arrhythmia shows an SVT with possible retrograde P wave in the early ST segment suggestive of AVRT.  On examination this morning  he appears comfortable, no chest pain or palpitations.  He is afebrile, heart rate is currently in the 70s in sinus rhythm on telemetry.  Systolic blood pressure ranging 100-140.  Lungs are clear without labored breathing.  Cardiac exam reveals RRR without gallop.  Shows potassium 3.9, BUN 15, creatinine 0.92, peak high-sensitivity troponin of 60 most consistent with demand ischemia, hemoglobin 13.8, platelets 196, TSH 3.43.  Patient presents with sustained SVT, possibly AVRT, He has a known history of PSVT.  We discussed his medications and anticipated adjustments.  He did not tolerate carvedilol higher than 3.125 mg twice daily in the past (we tried this), and it was increased to 6.25 mg twice daily during his recent hospital stay.  He actually feels like the higher dose beta-blocker has made his palpitations worse.  He did tolerate the intravenous diltiazem.  Although I would typically avoid a calcium channel blocker with his cardiomyopathy, in this case we will adjust medicines to include Coreg 3.125 mg twice daily and Cardizem 30 mg p.o. every 6 hours.  If this dose of Cardizem is tolerated, it can be converted to Cardizem CD  120 mg once daily tomorrow.  We will concurrently reduce his losartan to avoid hypotension.  I would anticipate EP consultation as an outpatient given recurring arrhythmias on medical therapy.  Jonelle SidleSamuel G. Chasten Blaze, M.D., F.A.C.C.

## 2019-04-04 NOTE — Progress Notes (Addendum)
eLink Physician-Brief Progress Note Patient Name: Lance [name] DOB: 1976/03/09 MRN: [id number]   Date of Service  04/04/2019  HPI/Events of Note  Bed side RN asking for troponin series for Chest pain/junctional tachy converted to sinus on cardizem.  Notes, labs, meds reviewed.  Camera assessment done. VS stable. Sinus at 75. Morbidly obese. MAP > 70. sats 97% on nasal o2. Resting comfortably.   A/P: 1. Angina/Junctional arhythmia. Low EF. S/p DES to LAD on June 6th. Taking his CAD Rx and antiplatelet meds.    eICU Interventions  - ordered troponin x 2 - continue care - on VTE prophylaxis. - Cardiology to follow up.       Intervention Category Intermediate Interventions: Arrhythmia - evaluation and management Evaluation Type: New Patient Evaluation  Elmer Sow 04/04/2019, 12:13 AM   5;46 AM Troponin 2nd up to 60's from 48. Has low ef/CAD. Off of cardizem. Sinus brady. Mostly demand ischemia. Follow 3 rd troponin at 7 am. If still going up, consider cardiology consult.

## 2019-04-04 NOTE — Progress Notes (Signed)
Patient is refusing the use of CPAP. RT educated patient on the benefit of wearing it. RT informed patient if he changes his mind have RN contact RT.

## 2019-04-04 NOTE — Progress Notes (Signed)
MD contacted about PT hypotension and sinus brady rhythm. Instructed to Per MD, hold cardizem gtt due to hypotension and SB. Continue to monitor HR. Contact if HR inc or becomes too low.

## 2019-04-05 DIAGNOSIS — I10 Essential (primary) hypertension: Secondary | ICD-10-CM

## 2019-04-05 DIAGNOSIS — I255 Ischemic cardiomyopathy: Secondary | ICD-10-CM | POA: Diagnosis not present

## 2019-04-05 DIAGNOSIS — E782 Mixed hyperlipidemia: Secondary | ICD-10-CM

## 2019-04-05 DIAGNOSIS — I471 Supraventricular tachycardia: Secondary | ICD-10-CM | POA: Diagnosis not present

## 2019-04-05 DIAGNOSIS — I25119 Atherosclerotic heart disease of native coronary artery with unspecified angina pectoris: Secondary | ICD-10-CM | POA: Diagnosis not present

## 2019-04-05 LAB — HIV ANTIBODY (ROUTINE TESTING W REFLEX): HIV Screen 4th Generation wRfx: NONREACTIVE

## 2019-04-05 LAB — GLUCOSE, CAPILLARY: Glucose-Capillary: 114 mg/dL — ABNORMAL HIGH (ref 70–99)

## 2019-04-05 MED ORDER — DILTIAZEM HCL ER COATED BEADS 120 MG PO CP24
120.0000 mg | ORAL_CAPSULE | Freq: Every day | ORAL | Status: DC
  Administered 2019-04-05: 120 mg via ORAL
  Filled 2019-04-05: qty 1

## 2019-04-05 MED ORDER — ATORVASTATIN CALCIUM 80 MG PO TABS: 80.0000 mg | ORAL_TABLET | Freq: Every day | ORAL | 3 refills | Status: DC

## 2019-04-05 MED ORDER — ASPIRIN EC 81 MG PO TBEC: 81.0000 mg | DELAYED_RELEASE_TABLET | Freq: Every day | ORAL | 5 refills | Status: AC

## 2019-04-05 MED ORDER — LOSARTAN POTASSIUM 50 MG PO TABS: 50.0000 mg | ORAL_TABLET | Freq: Every day | ORAL | 5 refills | Status: DC

## 2019-04-05 MED ORDER — DILTIAZEM HCL ER COATED BEADS 120 MG PO CP24: 120.0000 mg | ORAL_CAPSULE | Freq: Every day | ORAL | 5 refills | Status: DC

## 2019-04-05 MED ORDER — CARVEDILOL 3.125 MG PO TABS: 3.1250 mg | ORAL_TABLET | Freq: Two times a day (BID) | ORAL | 5 refills | Status: DC

## 2019-04-05 NOTE — Progress Notes (Signed)
Discharge instructions given to patient. Pt verbalized understanding of discharge instructions. Wife to pick up pt.

## 2019-04-05 NOTE — Discharge Summary (Signed)
Lance [name], is a 43 y.o. male  DOB 12/29/1975  MRN [id number].  Admission date:  04/03/2019  Admitting Physician  Starleen Armsawood S Elgergawy, MD  Discharge Date:  04/05/2019   Primary MD  Practice, [facility]  Recommendations for primary care physician for things to follow:   1) you are taking aspirin and Brilinta which are blood thinners so Avoid ibuprofen/Advil/Aleve/Motrin/Goody Powders/Naproxen/BC powders/Meloxicam/Diclofenac/Indomethacin and other Nonsteroidal anti-inflammatory medications as these will make you more likely to bleed and can cause stomach ulcers, can also cause Kidney problems.   2) reduce Coreg to 3.125 mg twice a day, reduce losartan to 50 mg twice a day, start Cardizem CD 120 mg once daily to help control your heart rate  3) CPAP use every night strongly advised  4)Please  continue to abstain from tobacco smoke  Admission Diagnosis  SVT (supraventricular tachycardia) (HCC) [I47.1]   Discharge Diagnosis  SVT (supraventricular tachycardia) (HCC) [I47.1]    Active Problems:   Essential hypertension, benign   Type 2 diabetes mellitus, uncontrolled (HCC)   Mixed hyperlipidemia   Family history of early CAD   Severe obesity with body mass index (BMI) of 35.0 to 39.9 with comorbidity (HCC)   CAD (coronary artery disease)   S/P drug eluting coronary stent placement   Atrial tachycardia (HCC)      Past Medical History:  Diagnosis Date   CAD (coronary artery disease), native coronary artery    10/18 PCI/DES to mRCA, and OM, with total occlusion of dLCx with collaterals.    Essential hypertension    GERD (gastroesophageal reflux disease)    History of kidney stones    Hyperlipemia    SVT (supraventricular tachycardia) (HCC) 2015   Converted with adenosine   Type 2 diabetes mellitus (HCC)     Past Surgical History:  Procedure Laterality Date   CARDIAC SURGERY      CORONARY ANGIOPLASTY WITH STENT PLACEMENT  07/15/2017   "2 stents"   CORONARY/GRAFT ACUTE MI REVASCULARIZATION N/A 03/12/2019   Procedure: Coronary/Graft Acute MI Revascularization;  Surgeon: Yvonne KendallEnd, Christopher, MD;  Location: [facility] INVASIVE CV LAB;  Service: Cardiovascular;  Laterality: N/A;   LAPAROSCOPIC APPENDECTOMY N/A 11/12/2017   Procedure: APPENDECTOMY LAPAROSCOPIC, REPAIR OF INCARCERATED INCISIONAL HERNIA;  Surgeon: Karie SodaGross, Steven, MD;  Location: [facility];  Service: General;  Laterality: N/A;   LAPAROSCOPIC CHOLECYSTECTOMY     LEFT HEART CATH AND CORONARY ANGIOGRAPHY N/A 07/15/2017   Procedure: LEFT HEART CATH AND CORONARY ANGIOGRAPHY;  Surgeon: Tonny Bollmanooper, Michael, MD;  Location: [facility];  Service: Cardiovascular;  Laterality: N/A;   LEFT HEART CATH AND CORONARY ANGIOGRAPHY N/A 03/12/2019   Procedure: LEFT HEART CATH AND CORONARY ANGIOGRAPHY;  Surgeon: Yvonne KendallEnd, Christopher, MD;  Location: [facility] INVASIVE CV [facility];  Service: Cardiovascular;  Laterality: N/A;       HPI  from the history and physical done on the day of admission:     Lance [name]  is a 43 y.o. male, with a history diabetes mellitus type 2, hyperlipidemia, obesity, CAD with history  of of PCI to LCx/OM and RCA in 2018, hypertension. Patient recently admitted to [facility]  06/06-06/07/2019 for STEMI s/p DES pLAD (distal LAD embolization, med rx), PTCA CFX/OM1 bifurcation 90%>>60%, EF 40-45% Echo, A1c 7.7%,  Patient presents to ED secondary to complaints of palpitation, reports episode 30 minutes before presentation to ED, he reports history of intermittent palpitation since his recent MRI 2 weeks ago at [facility], orts brief, resolved without intervention, but this time it lasted little bit longer which prompted him to come to ED, reports he had a history of SVT 2 years ago after his previous MI resembles current symptoms, patient reports he drinks half to 1 cups of coffee per day, drinks one diet Coke per day, denies any excessive  caffeine, alcohol or energy drink intake, he denies chest pain, shortness of breath, hemoptysis, fever, chills, nausea or vomiting, no dizziness or lightheadedness. - in ED patient EKG showing junctional tachycardia, per discussion with cardiology felt to be more atrial tachycardia, he did receive adenosine 6 mg x 2, with no conversion of rhythm, potassium was 3.6, magnesium still pending, as per discussion with neurology at [facility], suspicion for ischemic events currently, this is most likely arrhythmia following his recent MRI, recommendation for Cardizem drip overnight and official cardiology consult.    Hospital Course:   Brief Summary: 43 year old male patient with history of PCI to the circumflex/OM and RCA in 2018, hypertension, hyperlipidemia, type II DM, obesity who  had STEMI with VF arrest 03/12/2019 treated with successful defibrillation and DES to the LAD with distal emboli treated with transient Aggrastat. Also underwent PTCA on restenosed OM from 90 down to 60% EF 40 to 45%-readmitted 04/03/2019 with chest pains patient tells me he has been compliant with his medications and he has since quit smoking.    A/p  1)Atrial Tachycardia in a patient with history of PSVT----??? AVRT, cardiology input appreciated, patient is back in NSR... He does not tolerate high-dose beta-blocker well, per Dr. Diona BrownerMcDowell , dc on  Coreg 3.125 mg twice daily, Cardizem CD 120 mg daily (aware of ischemic cardiomyopathy with systolic dysfunction ),  .. He will probably need EP evaluation as outpatient.   2)CAD--status post V. fib arrest and STEMI on 03/08/2019---- status  post angioplasty and stenting with distal embolization of PTCA as outlined above--- troponins are flat, not consistent with ACS.... Suspect some degree of demand ischemia in the setting of tachycardia, c/n  aspirin 81 mg daily, and Brilinta, c/n  Lipitor 80 mg, Coreg 3.125 mg bid,   3)DM2- --- recent A1c 7.7, resume home insulin regimen    4)HTN--- blood pressures have been soft, losartan has been decreased to 50 mg daily to allow for titration of Cardizem   5) obesity/possible sleep apnea--patient had sleep study on 04/01/2019, awaiting results.....  Patient was not compliant with CPAP while in the hospital  6) history of tobacco abuse--- just quit smoking in June 2020 congratulated, encouraged to stay abstinent from tobacco  7) ischemic cardiomyopathy with EF in the 40 to 45% range per echo from 03/13/2019----no acute CHF flareup at this time, appears euvolemic, please see discussion in #1 above regarding reluctant Cardizem use... Losartan has been reduced to 50 mg daily due to BP concerns  Code Status : full  Family Communication:   NA (patient is alert, awake and coherent)   Disposition Plan  : Home  Consults  : Cardiology  Discharge Condition: stable  Follow UP--- cardiology in 6 weeks Diet and Activity  recommendation:  As advised  Discharge Instructions    Discharge Instructions    Call MD for:  difficulty breathing, headache or visual disturbances   Complete by: As directed    Call MD for:  persistant dizziness or light-headedness   Complete by: As directed    Call MD for:  persistant nausea and vomiting   Complete by: As directed    Call MD for:  severe uncontrolled pain   Complete by: As directed    Call MD for:  temperature >100.4   Complete by: As directed    Diet - low sodium heart healthy   Complete by: As directed    Discharge instructions   Complete by: As directed    1) you are taking aspirin and Brilinta which are blood thinners so Avoid ibuprofen/Advil/Aleve/Motrin/Goody Powders/Naproxen/BC powders/Meloxicam/Diclofenac/Indomethacin and other Nonsteroidal anti-inflammatory medications as these will make you more likely to bleed and can cause stomach ulcers, can also cause Kidney problems.   2) reduce Coreg to 3.125 mg twice a day, reduce losartan to 50 mg twice a day, start Cardizem CD  120 mg once daily to help control your heart rate  3) CPAP use every night strongly advised  4)Please  continue to abstain from tobacco smoke   Increase activity slowly   Complete by: As directed         Discharge Medications     Allergies as of 04/05/2019      Reactions   Bee Venom Anaphylaxis, Swelling   Penicillins Hives, Itching, Rash   Has patient had a PCN reaction causing immediate rash, facial/tongue/throat swelling, SOB or lightheadedness with hypotension: No Has patient had a PCN reaction causing severe rash involving mucus membranes or skin necrosis: No Has patient had a PCN reaction that required hospitalization: No Has patient had a PCN reaction occurring within the last 10 years: No If all of the above answers are "NO", then may proceed with Cephalosporin use.      Medication List    TAKE these medications   ALPRAZolam 1 MG tablet Commonly known as: XANAX Take 1 mg by mouth daily as needed for anxiety.   aspirin EC 81 MG tablet Take 1 tablet (81 mg total) by mouth daily with breakfast. What changed: when to take this   atorvastatin 80 MG tablet Commonly known as: LIPITOR Take 1 tablet (80 mg total) by mouth at bedtime.   carvedilol 3.125 MG tablet Commonly known as: COREG Take 1 tablet (3.125 mg total) by mouth 2 (two) times daily with a meal. What changed:   medication strength  how much to take   diltiazem 120 MG 24 hr capsule Commonly known as: CARDIZEM CD Take 1 capsule (120 mg total) by mouth daily. Start taking on: April 06, 2019   HumaLOG KwikPen 200 UNIT/ML Sopn Generic drug: Insulin Lispro INJECT 15-21 UNITS INTO THE SKIN THREE TIMES DAILY BEFORE MEALS   losartan 50 MG tablet Commonly known as: COZAAR Take 1 tablet (50 mg total) by mouth daily. Start taking on: April 06, 2019 What changed:   medication strength  how much to take   metFORMIN 500 MG 24 hr tablet Commonly known as: GLUCOPHAGE-XR Take 1 tablet (500 mg total) by mouth  2 (two) times daily.   nitroGLYCERIN 0.4 MG SL tablet Commonly known as: NITROSTAT Place 1 tablet (0.4 mg total) under the tongue every 5 (five) minutes x 3 doses as needed for chest pain.   Omega-3 1000 MG Caps Take 1,000 mg by  mouth 2 (two) times daily.   Pen Needles 31G X 8 MM Misc 1 each by Does not apply route 4 (four) times daily.   ticagrelor 90 MG Tabs tablet Commonly known as: BRILINTA Take 1 tablet (90 mg total) by mouth 2 (two) times daily.   Evaristo Bury FlexTouch 200 UNIT/ML Sopn Generic drug: Insulin Degludec INJECT 90 UNITS INTO THE SKIN AT BEDTIME What changed: See the new instructions.       Major procedures and Radiology Reports - PLEASE review detailed and final reports for all details, in brief -   Dg Chest Port 1 View  Result Date: 04/03/2019 CLINICAL DATA:  Heart began racing 30 minutes ago, history coronary artery disease, type II diabetes mellitus, hypertension, GERD, SVT EXAM: PORTABLE CHEST 1 VIEW COMPARISON:  Portable exam 1904 hours compared to 03/12/2019 FINDINGS: Enlargement of cardiac silhouette. Mediastinal contours and pulmonary vascularity normal. Lungs clear. No infiltrate, pleural effusion or pneumothorax. Minimal central peribronchial thickening. IMPRESSION: Enlargement of cardiac silhouette. Minimal bronchitic changes without infiltrate. Electronically Signed   By: Ulyses Southward M.D.   On: 04/03/2019 19:19   Dg Chest Portable 1 View  Result Date: 03/12/2019 CLINICAL DATA:  43 year old male with history of ST-elevation myocardial infarction. EXAM: PORTABLE CHEST 1 VIEW COMPARISON:  Chest x-ray 02/27/2018. FINDINGS: Transcutaneous defibrillator pads project over the left hemithorax. Lung volumes are low. Patient is in a very lordotic position. Lung volumes are low. No consolidative airspace disease. No pleural effusions. No pneumothorax. No pulmonary nodule or mass noted. Pulmonary vasculature and the cardiomediastinal silhouette are within normal limits.  IMPRESSION: 1. Low lung volumes without radiographic evidence of acute cardiopulmonary disease. Electronically Signed   By: Trudie Reed M.D.   On: 03/12/2019 19:12    Micro Results   Recent Results (from the past 240 hour(s))  SARS Coronavirus 2 (CEPHEID - Performed in [facility] hospital lab), Hosp Order     Status: None   Collection Time: 04/03/19  9:19 PM   Specimen: Nasopharyngeal Swab  Result Value Ref Range Status   SARS Coronavirus 2 NEGATIVE NEGATIVE Final    Comment: (NOTE) If result is NEGATIVE SARS-CoV-2 target nucleic acids are NOT DETECTED. The SARS-CoV-2 RNA is generally detectable in upper and lower  respiratory specimens during the acute phase of infection. The lowest  concentration of SARS-CoV-2 viral copies this assay can detect is 250  copies / mL. A negative result does not preclude SARS-CoV-2 infection  and should not be used as the sole basis for treatment or other  patient management decisions.  A negative result may occur with  improper specimen collection / handling, submission of specimen other  than nasopharyngeal swab, presence of viral mutation(s) within the  areas targeted by this assay, and inadequate number of viral copies  (<250 copies / mL). A negative result must be combined with clinical  observations, patient history, and epidemiological information. If result is POSITIVE SARS-CoV-2 target nucleic acids are DETECTED. The SARS-CoV-2 RNA is generally detectable in upper and lower  respiratory specimens dur ing the acute phase of infection.  Positive  results are indicative of active infection with SARS-CoV-2.  Clinical  correlation with patient history and other diagnostic information is  necessary to determine patient infection status.  Positive results do  not rule out bacterial infection or co-infection with other viruses. If result is PRESUMPTIVE POSTIVE SARS-CoV-2 nucleic acids MAY BE PRESENT.   A presumptive positive result was  obtained on the submitted specimen  and confirmed on repeat  testing.  While 2019 novel coronavirus  (SARS-CoV-2) nucleic acids may be present in the submitted sample  additional confirmatory testing may be necessary for epidemiological  and / or clinical management purposes  to differentiate between  SARS-CoV-2 and other Sarbecovirus currently known to infect humans.  If clinically indicated additional testing with an alternate test  methodology [phone]) is advised. The SARS-CoV-2 RNA is generally  detectable in upper and lower respiratory sp ecimens during the acute  phase of infection. The expected result is Negative. Fact Sheet for Patients:  BoilerBrush.com.cy Fact Sheet for Healthcare Providers: https://pope.com/ This test is not yet approved or cleared by the [place] FDA and has been authorized for detection and/or diagnosis of SARS-CoV-2 by FDA under an Emergency Use Authorization (EUA).  This EUA will remain in effect (meaning this test can be used) for the duration of the COVID-19 declaration under Section 564(b)(1) of the Act, 21 U.S.C. section 360bbb-3(b)(1), unless the authorization is terminated or revoked sooner. Performed at [facility], [address], [place], [place] [address]   MRSA PCR Screening     Status: None   Collection Time: 04/03/19 11:38 PM   Specimen: Nasal Mucosa; Nasopharyngeal  Result Value Ref Range Status   MRSA by PCR NEGATIVE NEGATIVE Final    Comment:        The GeneXpert MRSA Assay (FDA approved for NASAL specimens only), is one component of a comprehensive MRSA colonization surveillance program. It is not intended to diagnose MRSA infection nor to guide or monitor treatment for MRSA infections. Performed at [facility], [address] [place], [place] [address]        Today   Subjective    Donald Memoli today has no new complaints,   no chest pains, no palpitations, no  dizziness, no shortness of breath, no dyspnea on exertion,       Patient has been seen and examined prior to discharge   Objective   Blood pressure (!) 146/77, pulse 76, temperature 97.6 F (36.4 C), temperature source Oral, resp. rate 16, height  (1.803 m), weight 128.8 kg, SpO2 97 %.   Intake/Output Summary (Last 24 hours) at 04/05/2019 0944 Last data filed at 04/05/2019 0600 Gross per 24 hour  Intake 1208.8 ml  Output 1350 ml  Net -141.2 ml    Exam Gen:- Awake Alert,  Obese, no apparent distress  HEENT:- [place].AT, No sclera icterus Neck-Supple Neck,No JVD,.  Lungs-  CTAB , fair symmetrical air movement CV- S1, S2 normal, regular  Abd-  +ve B.Sounds, Abd Soft, No tenderness, increased truncal adiposity Extremity/Skin:- No  edema, pedal pulses present  Psych-affect is appropriate, oriented x3 Neuro-no new focal deficits, no tremors   Data Review   CBC w Diff:  Lab Results  Component Value Date   WBC 11.9 (H) 04/04/2019   HGB 13.8 04/04/2019   HCT 41.6 04/04/2019   PLT 196 04/04/2019   LYMPHOPCT 21 09/05/2017   MONOPCT 9 09/05/2017   EOSPCT 3 09/05/2017   BASOPCT 0 09/05/2017    CMP:  Lab Results  Component Value Date   NA 141 04/04/2019   NA 141 07/07/2017   K 3.9 04/04/2019   CL 107 04/04/2019   CO2 24 04/04/2019   BUN 15 04/04/2019   BUN 15 07/07/2017   CREATININE 0.92 04/04/2019   CREATININE 1.03 11/19/2018   PROT 6.4 (L) 04/04/2019   ALBUMIN 3.7 04/04/2019   BILITOT 0.7 04/04/2019   ALKPHOS 90 04/04/2019   AST 18  04/04/2019   ALT 31 04/04/2019  .   Total Discharge time is about 33 minutes  Shon Haleourage Errick Salts M.D on 04/05/2019 at 9:44 AM  Go to [email or web address] -  for contact info  [facility] - Office  [phone]

## 2019-04-05 NOTE — Progress Notes (Signed)
Progress Note  Patient Name: Lance [name] Date of Encounter: 04/05/2019  Primary Cardiologist: Satira Sark, MD  Subjective   No chest pain or palpitations.  No shortness of breath or cough.  Inpatient Medications    Scheduled Meds: . aspirin EC  81 mg Oral Daily  . atorvastatin  80 mg Oral QHS  . carvedilol  3.125 mg Oral BID WC  . Chlorhexidine Gluconate Cloth  6 each Topical Q0600  . diltiazem  120 mg Oral Daily  . enoxaparin (LOVENOX) injection  40 mg Subcutaneous Q24H  . insulin aspart  0-20 Units Subcutaneous TID WC  . insulin aspart  0-5 Units Subcutaneous QHS  . insulin glargine  70 Units Subcutaneous QHS  . losartan  50 mg Oral Daily  . omega-3 acid ethyl esters  1,000 mg Oral BID  . ticagrelor  90 mg Oral BID   Continuous Infusions: . sodium chloride 20 mL/hr at 04/05/19 0421   PRN Meds: ALPRAZolam, nitroGLYCERIN   Vital Signs    Vitals:   04/05/19 0500 04/05/19 0600 04/05/19 0637 04/05/19 0723  BP: 117/65 137/70 137/70   Pulse: (!) 56 69  70  Resp: 13 16  16   Temp:    97.6 F (36.4 C)  TempSrc:    Oral  SpO2: 97% 98%  97%  Weight:      Height:        Intake/Output Summary (Last 24 hours) at 04/05/2019 0835 Last data filed at 04/05/2019 0600 Gross per 24 hour  Intake 1208.8 ml  Output 1350 ml  Net -141.2 ml   Filed Weights   04/03/19 1820 04/03/19 2353 04/04/19 0500  Weight: 131.1 kg 128.8 kg 128.8 kg    Telemetry    Sinus rhythm with rare PVCs.  Personally reviewed.  Physical Exam   GEN:  Obese male.  No acute distress.   Neck: No JVD. Cardiac: RRR, no gallop.  Respiratory: Nonlabored. Clear to auscultation bilaterally. GI: Soft, nontender, bowel sounds present. MS: No edema; No deformity. Neuro:  Nonfocal. Psych: Alert and oriented x 3. Normal affect.  Labs    Chemistry Recent Labs  Lab 04/03/19 1830 04/04/19 0208  NA 135 141  K 3.6 3.9  CL 102 107  CO2 23 24  GLUCOSE 232* 232*  BUN 15 15  CREATININE 1.03 0.92   CALCIUM 8.9 8.9  PROT  --  6.4*  ALBUMIN  --  3.7  AST  --  18  ALT  --  31  ALKPHOS  --  90  BILITOT  --  0.7  GFRNONAA >60 >60  GFRAA >60 >60  ANIONGAP 10 10     Hematology Recent Labs  Lab 04/03/19 1830 04/04/19 0208  WBC 15.4* 11.9*  RBC 4.84 4.67  HGB 14.6 13.8  HCT 43.1 41.6  MCV 89.0 89.1  MCH 30.2 29.6  MCHC 33.9 33.2  RDW 12.9 13.1  PLT 251 196    Radiology    Dg Chest Port 1 View  Result Date: 04/03/2019 CLINICAL DATA:  Heart began racing 30 minutes ago, history coronary artery disease, type II diabetes mellitus, hypertension, GERD, SVT EXAM: PORTABLE CHEST 1 VIEW COMPARISON:  Portable exam 1904 hours compared to 03/12/2019 FINDINGS: Enlargement of cardiac silhouette. Mediastinal contours and pulmonary vascularity normal. Lungs clear. No infiltrate, pleural effusion or pneumothorax. Minimal central peribronchial thickening. IMPRESSION: Enlargement of cardiac silhouette. Minimal bronchitic changes without infiltrate. Electronically Signed   By: Lavonia Dana M.D.   On: 04/03/2019 19:19  Cardiac Studies   Echocardiogram 03/13/2019:  1. Severe akinesis of the left ventricular, apical anteroseptal wall.  2. The left ventricle has mild-moderately reduced systolic function, with an ejection fraction of 40-45%. The cavity size was normal. Left ventricular diastolic Doppler parameters are consistent with pseudonormalization.  3. The right ventricle has normal systolic function. The cavity was normal. There is no increase in right ventricular wall thickness.  4. No stenosis of the aortic valve.  5. The aortic root and ascending aorta are normal in size and structure.  6. The interatrial septum was not assessed.  Patient Profile     43 y.o. male with a history of CAD status post previous PCI to the circumflex/OM and RCA in 2018, more recently anterior STEMI with VF arrest treated with DES to the LAD and ultimately PTCA of a restenosed OM in early June, hypertension,  hyperlipidemia, and type 2 diabetes mellitus.  He also history is has a history of PSVT and presents now with recurrent arrhythmia.  Assessment & Plan    1.  PSVT presenting with sustained arrhythmia, most likely AVRT.  He has been clinically stable at this point on combination of low-dose Coreg (did not tolerate higher dose in the past) and Cardizem which will be switched from short acting dose to Cardizem CD 120 mg today.  2.  CAD status post multiple PCI as outlined above, most recently anterior STEMI complicated by VF arrest and placement of DES to the LAD, followed by staged PTCA of a restenosed OM with moderate residual stenosis to be managed medically.  He reports no active angina symptoms at this time.  High-sensitivity troponin levels are in demand ischemia range.  3.  Ischemic cardiomyopathy with LVEF 40 to 45% range.  4.  Essential hypertension, losartan dose was cut back to allow titration of AV nodal blockers.  5.  Mixed hyperlipidemia on Lipitor.  [facility] will sign off.  Anticipate discharge home today. Medication Recommendations: Continue current medications for discharge including Coreg at 3.125 mg twice daily and newly started Cardizem CD 120 mg daily.  Also note that losartan dose was cut back to 50 mg daily. Other recommendations (labs, testing, etc): No further cardiac testing at this time. Follow up as an outpatient: We will schedule a follow-up visit in approximately 6 weeks for reassessment.  Signed, Nona DellSamuel Desaray Marschner, MD  04/05/2019, 8:35 AM

## 2019-04-05 NOTE — Discharge Instructions (Signed)
1) you are taking aspirin and Brilinta which are blood thinners so Avoid ibuprofen/Advil/Aleve/Motrin/Goody Powders/Naproxen/BC powders/Meloxicam/Diclofenac/Indomethacin and other Nonsteroidal anti-inflammatory medications as these will make you more likely to bleed and can cause stomach ulcers, can also cause Kidney problems.   2) reduce Coreg to 3.125 mg twice a day, reduce losartan to 50 mg twice a day, start Cardizem CD 120 mg once daily to help control your heart rate  3) CPAP use every night strongly advised  4)Please  continue to abstain from tobacco smoke

## 2019-04-11 ENCOUNTER — Telehealth: Payer: Self-pay | Admitting: Cardiology

## 2019-04-11 ENCOUNTER — Encounter: Payer: Self-pay | Admitting: Cardiovascular Disease

## 2019-04-11 NOTE — Telephone Encounter (Signed)
Pt is calling wanting to know results from sleep study.

## 2019-04-11 NOTE — Telephone Encounter (Signed)
Ordered by Dr.Kelly, will forward

## 2019-04-11 NOTE — Procedures (Signed)
   [facility] STUDY    Patient Name: Lance [name], Lance [name] Date: 04/01/2019 Gender: Male D.O.B: March 24, 1976 Age (years): 86 Referring Provider: Shelva Majestic MD, ABSM Height (inches): 71 Interpreting Physician: Shelva Majestic MD, ABSM Weight (lbs): 289 RPSGT: Peak, Robert BMI: 40 MRN: [id number] Neck Size: <br>  CLINICAL INFORMATION Sleep Study Type: HST  Indication for sleep study: Snoring  Epworth Sleepiness Score: 3  SLEEP STUDY TECHNIQUE A multi-channel overnight portable sleep study was performed. The channels recorded were: nasal airflow, thoracic respiratory movement, and oxygen saturation with a pulse oximetry. Snoring was also monitored.  MEDICATIONS     ALPRAZolam (XANAX) 1 MG tablet             aspirin EC 81 MG tablet         atorvastatin (LIPITOR) 80 MG tablet         carvedilol (COREG) 3.125 MG tablet         diltiazem (CARDIZEM CD) 120 MG 24 hr capsule         HUMALOG KWIKPEN 200 UNIT/ML SOPN         Insulin Pen Needle (PEN NEEDLES) 31G X 8 MM MISC         losartan (COZAAR) 50 MG tablet         metFORMIN (GLUCOPHAGE-XR) 500 MG 24 hr tablet         nitroGLYCERIN (NITROSTAT) 0.4 MG SL tablet         Omega-3 1000 MG CAPS         ticagrelor (BRILINTA) 90 MG TABS tablet         TRESIBA FLEXTOUCH 200 UNIT/ML SOPN      Patient self administered medications include: N/A.  SLEEP ARCHITECTURE Patient was studied for 434.4 minutes. The sleep efficiency was 90.5 % and the patient was supine for 42.2%. The arousal index was 0.0 per hour.  RESPIRATORY PARAMETERS The overall AHI was 2.6 per hour, with a central apnea index of 0.4 per hour.  Supine AHI was 4.3/hour  The oxygen nadir was 81% during sleep which occurred during a brief central apneic event.  Total time <88% was 0.3 minutes.  CARDIAC DATA Mean heart rate during sleep was 62.6 bpm.  IMPRESSIONS - Increased upper airway resistance syndrome (UARS) without definitive obstructive sleep apnea  (AHI = 2.6/h). - No significant central sleep apnea occurred during this study (CAI = 0.4/h). - Moderate transient oxygen desaturation was noted during this study (Min O2 = 81%). - Patient snored 47.5% during the sleep.  DIAGNOSIS - Increased Upper Airway Resistance Syndrome (UARS) - Snoring  RECOMMENDATIONS - At present there is no indication for CPAP therapy. - Effort should be made to optimize nasal and oropharyngeal patency. - Consider alternatives for the treatment of snoring. - Avoid alcohol, sedatives and other CNS depressants that may worsen sleep apnea and disrupt normal sleep architecture. - Sleep hygiene should be reviewed to assess factors that may improve sleep quality. - Weight management and regular exercise should be initiated or continued.   [Electronically signed] 04/11/2019 12:42 PM  Shelva Majestic MD, [facility] : [facility], [place], American Board of Sleep Medicine   NPI: [id number] [place] PH: [phone]   FX: [phone] [place]

## 2019-04-12 NOTE — Telephone Encounter (Signed)
Pt notified of sleep study results

## 2019-04-12 NOTE — Telephone Encounter (Signed)
See dictated sleep study report; no indication at present for CPAP

## 2019-04-12 NOTE — Telephone Encounter (Signed)
Attempt to reach, line just rings, no vm set up

## 2019-04-14 ENCOUNTER — Telehealth: Payer: Self-pay

## 2019-04-14 DIAGNOSIS — E11641 Type 2 diabetes mellitus with hypoglycemia with coma: Secondary | ICD-10-CM

## 2019-04-14 MED ORDER — TRESIBA FLEXTOUCH 200 UNIT/ML ~~LOC~~ SOPN: 90.0000 [IU] | PEN_INJECTOR | Freq: Every day | SUBCUTANEOUS | 0 refills | Status: DC

## 2019-04-14 NOTE — Telephone Encounter (Signed)
2 boxes of Tresiba 200 u/ml given to the patient until pharmacy resolves prior approval for medications

## 2019-04-14 NOTE — Telephone Encounter (Signed)
LeighAnn Nolia Tschantz, CMA  

## 2019-04-20 ENCOUNTER — Other Ambulatory Visit: Payer: Self-pay

## 2019-04-20 DIAGNOSIS — I471 Supraventricular tachycardia: Secondary | ICD-10-CM | POA: Insufficient documentation

## 2019-04-20 DIAGNOSIS — I255 Ischemic cardiomyopathy: Secondary | ICD-10-CM | POA: Insufficient documentation

## 2019-04-20 MED ORDER — INSULIN GLARGINE (2 UNIT DIAL) 300 UNIT/ML ~~LOC~~ SOPN: 90.0000 [IU] | PEN_INJECTOR | Freq: Every day | SUBCUTANEOUS | 2 refills | Status: DC

## 2019-04-20 NOTE — Progress Notes (Signed)
Cardiology Office Note    Date:  04/25/2019   ID:  Lance [name], DOB 07/09/1976, MRN [id number]  PCP:  [facility]  Cardiologist: Rozann Lesches, MD EPS: None  No chief complaint on file.   History of Present Illness:  Lance [name] is a 43 y.o. male with history of PCI to circumflex and OM and RCA in 2018, anterior STEMI with V. fib arrest treated with DES to the LAD and ultimately PTCA of the restenosed OM with moderate residual stenosis to be managed medically 03/2019, hypertension, HLD, DM type II, history of PSVT.  Ischemic cardiomyopathy EF 40 to 45%.  Patient was admitted to the hospital 04/04/2019 with PSVT most likely AV nodal reentrant tachycardia.  He was on low-dose Coreg as he did not tolerate higher doses in the past-says it causes arrythmia and Cardizem was switched from short-acting to long-acting 120 mg daily.   Patient was told he doesn't have sleep apnea but I cannot access the results.  Overall he is doing well.  Has not had many palpitations since on diltiazem.  Has to take it at night as it made him dizzy.  Denies chest pain, dyspnea, dizziness or presyncope.  Asking for a note to not have to work in the heat.  Has only smoked 2 cigarettes since hospitalization.  Past Medical History:  Diagnosis Date  . CAD (coronary artery disease), native coronary artery    10/18 PCI/DES to mRCA, and OM, with total occlusion of dLCx with collaterals.   . Essential hypertension   . GERD (gastroesophageal reflux disease)   . History of kidney stones   . Hyperlipemia   . SVT (supraventricular tachycardia) ([place]) 2015   Converted with adenosine  . Type 2 diabetes mellitus ([place])     Past Surgical History:  Procedure Laterality Date  . CARDIAC SURGERY    . CORONARY ANGIOPLASTY WITH STENT PLACEMENT  07/15/2017   "2 stents"  . CORONARY/GRAFT ACUTE MI REVASCULARIZATION N/A 03/12/2019   Procedure: Coronary/Graft Acute MI Revascularization;  Surgeon: Nelva Bush,  MD;  Location: [facility];  Service: Cardiovascular;  Laterality: N/A;  . LAPAROSCOPIC APPENDECTOMY N/A 11/12/2017   Procedure: APPENDECTOMY LAPAROSCOPIC, REPAIR OF INCARCERATED INCISIONAL HERNIA;  Surgeon: Michael Boston, MD;  Location: [facility];  Service: General;  Laterality: N/A;  . LAPAROSCOPIC CHOLECYSTECTOMY    . LEFT HEART CATH AND CORONARY ANGIOGRAPHY N/A 07/15/2017   Procedure: LEFT HEART CATH AND CORONARY ANGIOGRAPHY;  Surgeon: Sherren Mocha, MD;  Location: [facility];  Service: Cardiovascular;  Laterality: N/A;  . LEFT HEART CATH AND CORONARY ANGIOGRAPHY N/A 03/12/2019   Procedure: LEFT HEART CATH AND CORONARY ANGIOGRAPHY;  Surgeon: Nelva Bush, MD;  Location: [facility];  Service: Cardiovascular;  Laterality: N/A;    Current Medications: Current Meds  Medication Sig  . ALPRAZolam (XANAX) 1 MG tablet Take 1 mg by mouth daily as needed for anxiety.  Marland Kitchen aspirin EC 81 MG tablet Take 1 tablet (81 mg total) by mouth daily with breakfast.  . atorvastatin (LIPITOR) 80 MG tablet Take 1 tablet (80 mg total) by mouth at bedtime.  . carvedilol (COREG) 3.125 MG tablet Take 1 tablet (3.125 mg total) by mouth 2 (two) times daily with a meal.  . diltiazem (CARDIZEM CD) 120 MG 24 hr capsule Take 1 capsule (120 mg total) by mouth daily.  Marland Kitchen HUMALOG KWIKPEN 200 UNIT/ML SOPN INJECT 15-21 UNITS INTO THE SKIN THREE TIMES DAILY BEFORE MEALS  . Insulin Degludec (  TRESIBA FLEXTOUCH) 200 UNIT/ML SOPN Inject 90 Units into the skin at bedtime.  . Insulin Glargine, 2 Unit Dial, 300 UNIT/ML SOPN Inject 90 Units into the skin at bedtime.  . Insulin Pen Needle (PEN NEEDLES) 31G X 8 MM MISC 1 each by Does not apply route 4 (four) times daily.  Marland Kitchen. losartan (COZAAR) 50 MG tablet Take 1 tablet (50 mg total) by mouth daily.  . metFORMIN (GLUCOPHAGE-XR) 500 MG 24 hr tablet Take 1 tablet (500 mg total) by mouth 2 (two) times daily.  . nitroGLYCERIN (NITROSTAT) 0.4 MG SL tablet Place 1 tablet (0.4  mg total) under the tongue every 5 (five) minutes x 3 doses as needed for chest pain.  . Omega-3 1000 MG CAPS Take 1,000 mg by mouth 2 (two) times daily.  . ticagrelor (BRILINTA) 90 MG TABS tablet Take 1 tablet (90 mg total) by mouth 2 (two) times daily.     Allergies:   Bee venom and Penicillins   Social History   Socioeconomic History  . Marital status: Married    Spouse name: Not on file  . Number of children: Not on file  . Years of education: Not on file  . Highest education level: Not on file  Occupational History  . Occupation: Conservation officer, natureuperintendent    Employer: [employer]  Social Needs  . Financial resource strain: Not on file  . Food insecurity    Worry: Not on file    Inability: Not on file  . Transportation needs    Medical: Not on file    Non-medical: Not on file  Tobacco Use  . Smoking status: Former Smoker    Packs/day: 0.50    Years: 26.00    Pack years: 13.00    Types: Cigarettes    Quit date: 03/07/2019    Years since quitting: 0.1  . Smokeless tobacco: Never Used  Substance and Sexual Activity  . Alcohol use: No  . Drug use: No  . Sexual activity: Yes    Birth control/protection: None  Lifestyle  . Physical activity    Days per week: Not on file    Minutes per session: Not on file  . Stress: Not on file  Relationships  . Social Musicianconnections    Talks on phone: Not on file    Gets together: Not on file    Attends religious service: Not on file    Active member of club or organization: Not on file    Attends meetings of clubs or organizations: Not on file    Relationship status: Not on file  Other Topics Concern  . Not on file  Social History Narrative  . Not on file     Family History:  The patient's   family history includes Diabetes in his mother; Gout in his mother; Heart attack (age of onset: 4157) in his father; Heart disease (age of onset: 2762) in his mother.   ROS:   Please see the history of present illness.    ROS All other systems  reviewed and are negative.   PHYSICAL EXAM:   VS:  BP (!) 148/85   Pulse 74   Wt 286 lb (129.7 kg)   SpO2 98%   BMI 39.89 kg/m   Physical Exam  GEN: Obese, in no acute distress  Neck: no JVD, carotid bruits, or masses Cardiac:RRR; distant heart sounds no murmurs, rubs, or gallops  Respiratory:  clear to auscultation bilaterally, normal work of breathing GI: soft, nontender, nondistended, + BS Ext:  without cyanosis, clubbing, or edema, Good distal pulses bilaterally Neuro:  Alert and Oriented x 3 Psych: euthymic mood, full affect  Wt Readings from Last 3 Encounters:  04/25/19 286 lb (129.7 kg)  04/04/19 283 lb 15.2 oz (128.8 kg)  03/29/19 289 lb 9.6 oz (131.4 kg)      Studies/Labs Reviewed:   EKG:  EKG is not ordered today.   Recent Labs: 04/03/2019: Magnesium 1.9; TSH 3.430 04/04/2019: ALT 31; BUN 15; Creatinine, Ser 0.92; Hemoglobin 13.8; Platelets 196; Potassium 3.9; Sodium 141   Lipid Panel    Component Value Date/Time   CHOL 124 03/13/2019 0547   TRIG 234 (H) 03/13/2019 0547   HDL 20 (L) 03/13/2019 0547   CHOLHDL 6.2 03/13/2019 0547   VLDL 47 (H) 03/13/2019 0547   LDLCALC 57 03/13/2019 0547   LDLCALC 63 11/19/2018 0822    Additional studies/ records that were reviewed today include:  ECHO: 03/13/2019  1. Severe akinesis of the left ventricular, apical anteroseptal wall.  2. The left ventricle has mild-moderately reduced systolic function, with an ejection fraction of 40-45%. The cavity size was normal. Left ventricular diastolic Doppler parameters are consistent with pseudonormalization.  3. The right ventricle has normal systolic function. The cavity was normal. There is no increase in right ventricular wall thickness.  4. No stenosis of the aortic valve.  5. The aortic root and ascending aorta are normal in size and structure.  6. The interatrial septum was not assessed.     CATH: 03/12/19   Conclusions: 1. Severe two-vessel CAD with 90% stenosis of  proximal LAD just beyond D1 with thrombos (some of which had embolized to the apical LAD) and 99% in-stent restenosis of OM1 and 90% stenosis of jailed mid LCx. 2. Mild in-stent restenosis involving the mid RCA. 3. Moderately elevated left ventricular filling pressure with suggestion of anterior hypokinesis on the left ventriculogram (suboptimal opacification with hand-injection). 4. Successful PCI to proximal LAD using Synergy 3.0 x 2 mm DES (post-dilated proximally to 4.0 mm) with 0% residual stenosis and TIMI-3 flow. 5. Success balloon angioplasty to LCx/OM1 stent using [place] Euphora 2.25 x 8 mm and Sapphire 2.5 x 12 mm balloons with reduction in stenosis of OM1 from 99% to 40% and mid LCx from 90% to 60% and restoration of TIMI-3 flow.  Given improving chest pain, relatively small vessel size with need for complex bifurcation intervention, and radiation/contrast doses administered to that point, decision was made to forego additional intervention to this vessel.   Recommendations: 1. Continue tirofiban infusion x 18 hours. 2. Dual antiplatelet therapy with aspirin and ticagrelor for at least 12 months, ideally longer. 3. Aggressive secondary prevention. 4. Obtain transthoracic echocardiogram to assess LV function. 5. If patient has refractory chest pain, complex bifurcation intervention to previously stented LCx/OM1 and AV groove LCx could be considered, though I worry about restenosis in the future given aggressive restenosis since prior PCI in 2018, relatively small vessel size, and likely need for culotte stenting of the LCx.   Yvonne Kendallhristopher End, MD [facility]         ASSESSMENT:    1. Coronary artery disease involving native coronary artery of native heart without angina pectoris   2. SVT (supraventricular tachycardia) (HCC)   3. Ischemic cardiomyopathy   4. Uncontrolled type 2 diabetes mellitus with hyperglycemia (HCC)   5. Essential hypertension, benign   6. Mixed hyperlipidemia    7. Severe obesity with body mass index (BMI) of 35.0 to 39.9 with comorbidity (HCC)  PLAN:  In order of problems listed above:  Recent hospitalization for SVT which looks like AV nodal reentrant tachycardia started on long-acting diltiazem in addition to his carvedilol.  Patient tolerating well and has not had any recurrent SVT.  If he does needs to see EP.  Gave him a note that he does not have to work in the heat.  Follow-up with Dr. Diona BrownerMcDowell in 2 months.  CAD status post V. fib arrest and STEMI 03/08/2019 treated with stenting to the LAD with distal embolization and PTCA of the circumflex/OM1 bifurcation reducing it from 90% down to 60% if refractory pain could consider possible intervention-no angina  History of ischemic cardiomyopathy ejection fraction 40 to 45% on echo 03/13/2019- no heart failure on exam  Diabetes mellitus type 2 hemoglobin A1c 7.7  Hypertension blood pressure tends to run low but stable today  Hyperlipidemia on Lipitor DL 48 8/46967/2019  Possible sleep apnea-sleep study pending-patient was told he does not have sleep apnea  Tobacco abuse has only smoked 2 cigarettes since discharge.  Smoking cessation discussed.  Obesity exercise and weight loss recommended.  Medication Adjustments/Labs and Tests Ordered: Current medicines are reviewed at length with the patient today.  Concerns regarding medicines are outlined above.  Medication changes, Labs and Tests ordered today are listed in the Patient Instructions below. Patient Instructions  Medication Instructions: Your physician recommends that you continue on your current medications as directed. Please refer to the Current Medication list given to you today.   Labwork: none  Procedures/Testing: none  Follow-Up: 2 months with Dr.McDowell  Any Additional Special Instructions Will Be Listed Below (If Applicable).     If you need a refill on your cardiac medications before your next appointment, please call  your pharmacy.      Thank you for choosing [facility] !           Elson ClanSigned, Michele Lenze, PA-C  04/25/2019 11:21 AM    [facility] [address], [place], [place]  [address] Phone: [phone]; Fax: [phone]

## 2019-04-22 ENCOUNTER — Other Ambulatory Visit: Payer: Self-pay | Admitting: "Endocrinology

## 2019-04-22 DIAGNOSIS — E11641 Type 2 diabetes mellitus with hypoglycemia with coma: Secondary | ICD-10-CM

## 2019-04-25 ENCOUNTER — Encounter: Payer: Self-pay | Admitting: Physician Assistant

## 2019-04-25 ENCOUNTER — Ambulatory Visit (INDEPENDENT_AMBULATORY_CARE_PROVIDER_SITE_OTHER): Payer: 59 | Admitting: Physician Assistant

## 2019-04-25 ENCOUNTER — Other Ambulatory Visit: Payer: Self-pay

## 2019-04-25 VITALS — BP 148/85 | HR 74 | Wt 286.0 lb

## 2019-04-25 DIAGNOSIS — I2510 Atherosclerotic heart disease of native coronary artery without angina pectoris: Secondary | ICD-10-CM

## 2019-04-25 DIAGNOSIS — E1165 Type 2 diabetes mellitus with hyperglycemia: Secondary | ICD-10-CM | POA: Diagnosis not present

## 2019-04-25 DIAGNOSIS — E782 Mixed hyperlipidemia: Secondary | ICD-10-CM

## 2019-04-25 DIAGNOSIS — I255 Ischemic cardiomyopathy: Secondary | ICD-10-CM

## 2019-04-25 DIAGNOSIS — I471 Supraventricular tachycardia: Secondary | ICD-10-CM | POA: Diagnosis not present

## 2019-04-25 DIAGNOSIS — I10 Essential (primary) hypertension: Secondary | ICD-10-CM

## 2019-04-25 IMAGING — DX DG CHEST 2V
2 series · 2 of 2 positions shown · non-contrast
Comparison: 12/24/2016

CLINICAL DATA: Chest pain and shortness of breath for 1 week.

EXAM:
CHEST  2 VIEW

[chest pa]
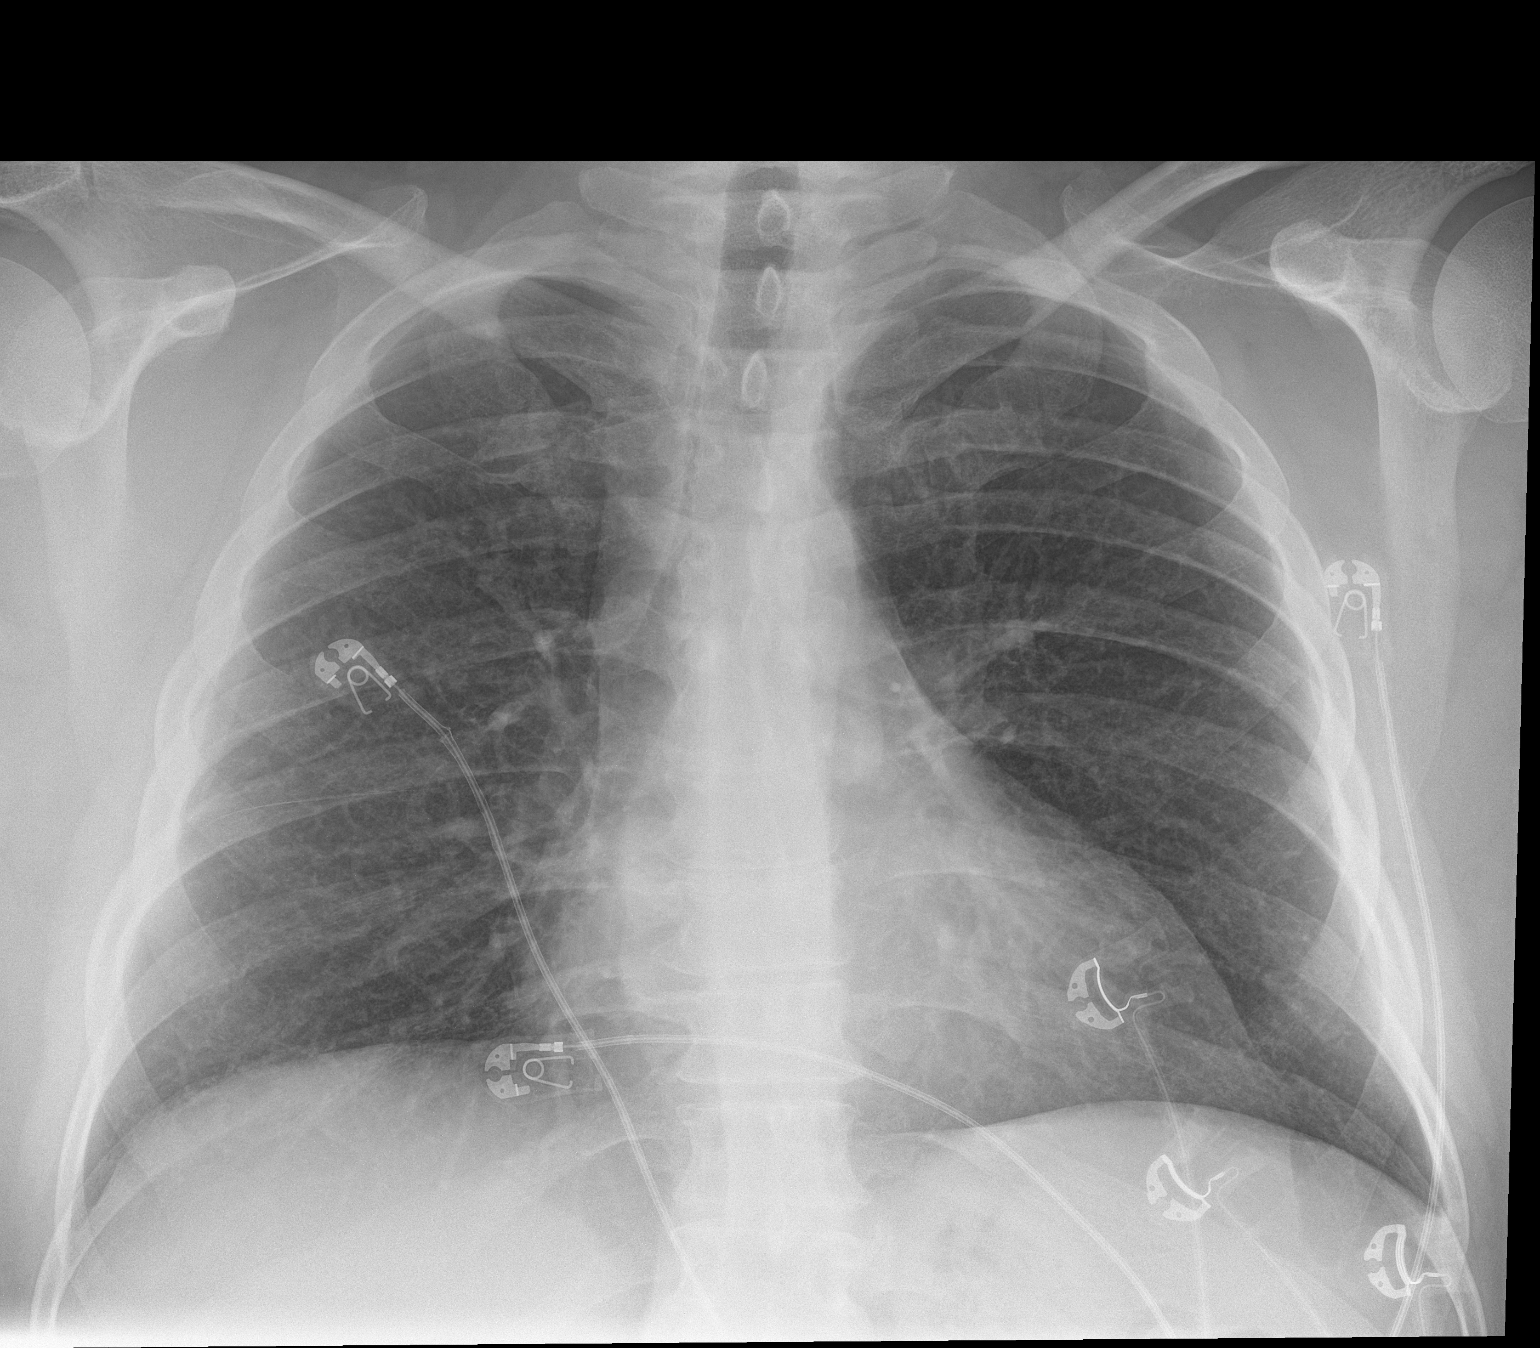

[chest lat]
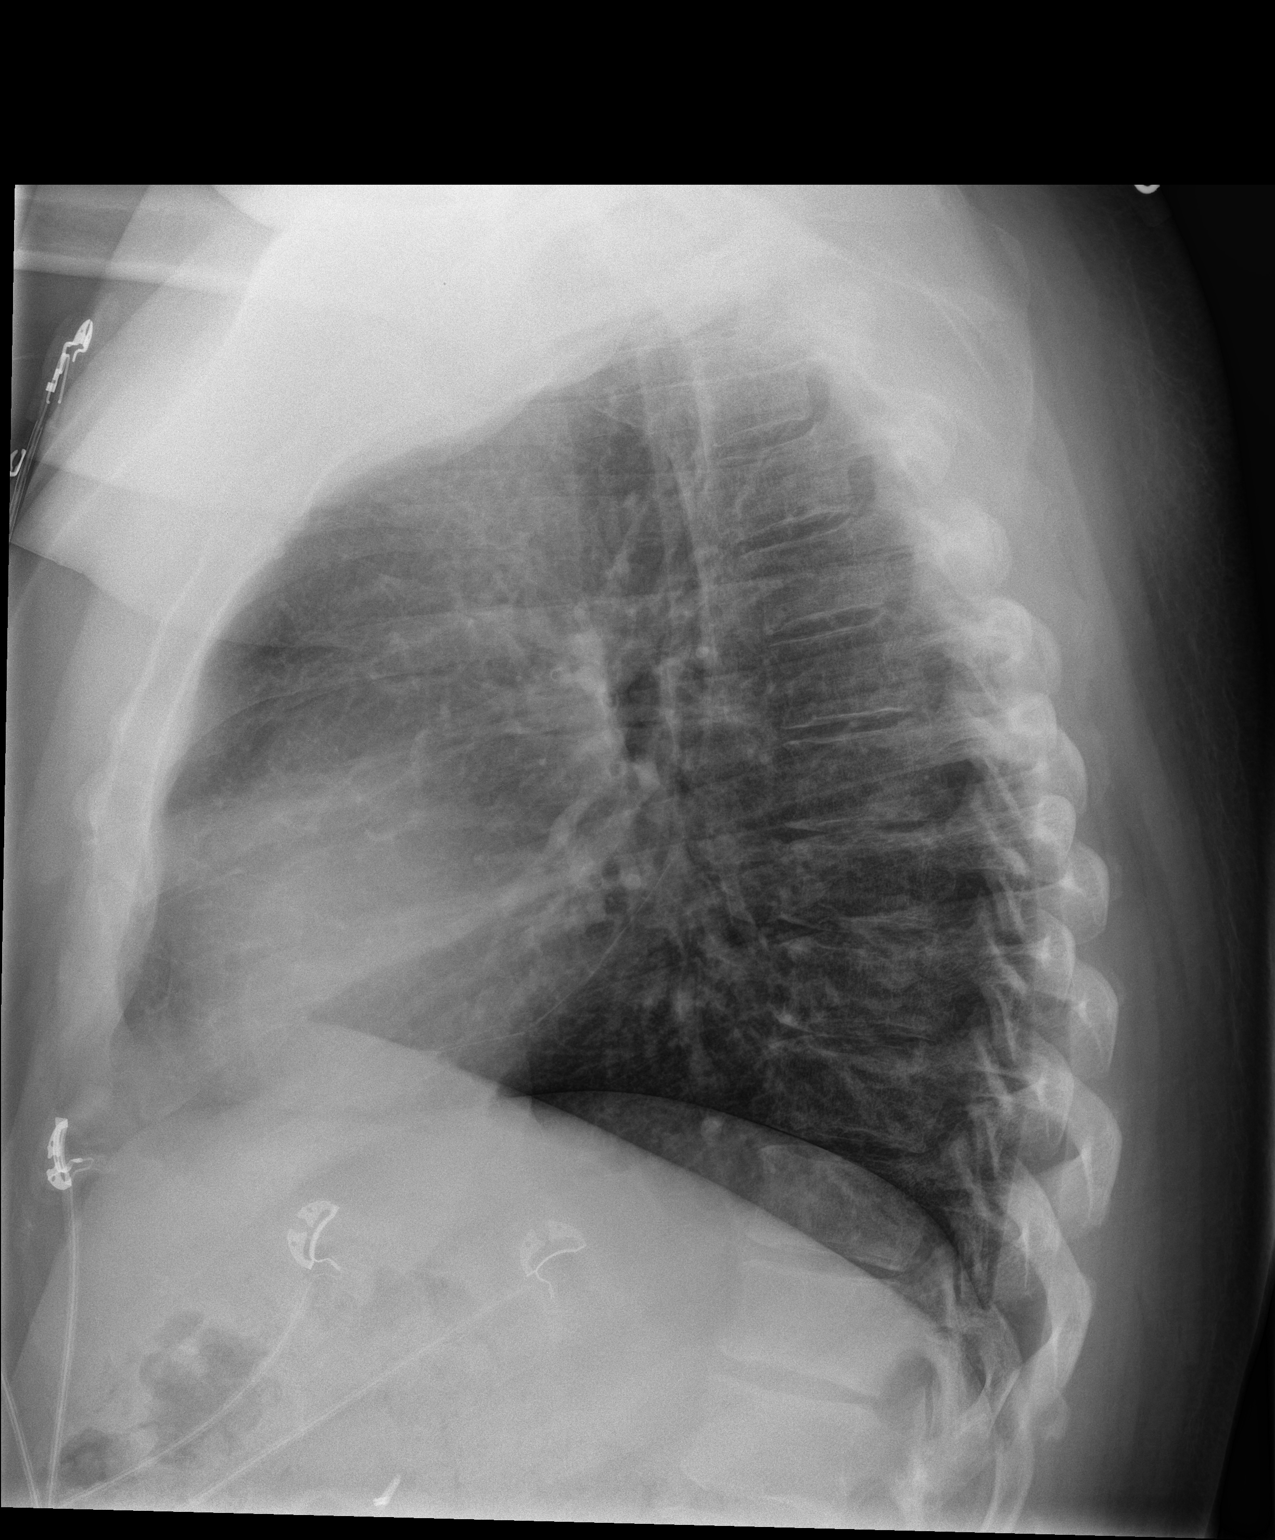

[2 of 2 positions shown; findings below may reference images not displayed]

FINDINGS: The heart size and mediastinal contours are within normal limits.
Both lungs are clear. The visualized skeletal structures are
unremarkable.
IMPRESSION: No active cardiopulmonary disease.

## 2019-04-25 NOTE — Patient Instructions (Signed)
Medication Instructions: Your physician recommends that you continue on your current medications as directed. Please refer to the Current Medication list given to you today.   Labwork: none  Procedures/Testing: none  Follow-Up: 2 months with Dr.McDowell  Any Additional Special Instructions Will Be Listed Below (If Applicable).     If you need a refill on your cardiac medications before your next appointment, please call your pharmacy.      Thank you for choosing [facility] !

## 2019-04-30 LAB — COMPLETE METABOLIC PANEL WITH GFR
AG RATIO: 2 (calc) (ref 1.0–2.5)
ALT: 36 U/L (ref 9–46)
AST: 21 U/L (ref 10–40)
Albumin: 4.2 g/dL (ref 3.6–5.1)
Alkaline phosphatase (APISO): 85 U/L (ref 36–130)
BUN: 14 mg/dL (ref 7–25)
CALCIUM: 9.6 mg/dL (ref 8.6–10.3)
CHLORIDE: 105 mmol/L (ref 98–110)
CO2: 29 mmol/L (ref 20–32)
CREATININE: 1.09 mg/dL (ref 0.60–1.35)
GFR, EST AFRICAN AMERICAN: 96 mL/min/{1.73_m2} (ref >=60)
GFR, Est Non African American: 83 mL/min/{1.73_m2} (ref >=60)
Globulin: 2.1 g/dL (calc) (ref 1.9–3.7)
Glucose, Bld: 120 mg/dL — ABNORMAL HIGH (ref 65–99)
POTASSIUM: 4.2 mmol/L (ref 3.5–5.3)
Sodium: 140 mmol/L (ref 135–146)
Total Bilirubin: 0.7 mg/dL (ref 0.2–1.2)
Total Protein: 6.3 g/dL (ref 6.1–8.1)

## 2019-04-30 LAB — HEMOGLOBIN A1C
EAG (MMOL/L): 9.2 (calc)
HEMOGLOBIN A1C: 7.4 %{Hb} — AB (ref <5.7)
Mean Plasma Glucose: 166 (calc)

## 2019-05-02 ENCOUNTER — Other Ambulatory Visit: Payer: Self-pay

## 2019-05-02 ENCOUNTER — Ambulatory Visit (INDEPENDENT_AMBULATORY_CARE_PROVIDER_SITE_OTHER): Payer: 59 | Admitting: "Endocrinology

## 2019-05-02 ENCOUNTER — Encounter: Payer: Self-pay | Admitting: "Endocrinology

## 2019-05-02 VITALS — BP 151/83 | HR 74 | Temp 97.6°F | Ht 71.0 in | Wt 291.4 lb

## 2019-05-02 DIAGNOSIS — E782 Mixed hyperlipidemia: Secondary | ICD-10-CM | POA: Diagnosis not present

## 2019-05-02 DIAGNOSIS — E1165 Type 2 diabetes mellitus with hyperglycemia: Secondary | ICD-10-CM

## 2019-05-02 DIAGNOSIS — I10 Essential (primary) hypertension: Secondary | ICD-10-CM

## 2019-05-02 MED ORDER — HUMALOG KWIKPEN 200 UNIT/ML ~~LOC~~ SOPN: 15.0000 [IU] | PEN_INJECTOR | Freq: Three times a day (TID) | SUBCUTANEOUS | 2 refills | Status: DC

## 2019-05-02 MED ORDER — INSULIN GLARGINE 100 UNIT/ML SOLOSTAR PEN: 90.0000 [IU] | PEN_INJECTOR | Freq: Every day | SUBCUTANEOUS | 2 refills | Status: DC

## 2019-05-02 NOTE — Patient Instructions (Signed)

## 2019-05-02 NOTE — Progress Notes (Signed)
05/02/2019  Endocrinology follow-up note  Subjective:    Patient ID: Lance [name], male    DOB: 06/01/1976. Patient is being seen in follow-up for management of currently uncontrolled symptomatic type 2 diabetes complicated by coronary artery disease recently which required stent placement, hyperlipidemia, hypertension.   Past Medical History:  Diagnosis Date  . CAD (coronary artery disease), native coronary artery    10/18 PCI/DES to mRCA, and OM, with total occlusion of dLCx with collaterals.   . Essential hypertension   . GERD (gastroesophageal reflux disease)   . History of kidney stones   . Hyperlipemia   . SVT (supraventricular tachycardia) (HCC) 2015   Converted with adenosine  . Type 2 diabetes mellitus (HCC)    Past Surgical History:  Procedure Laterality Date  . CARDIAC SURGERY    . CORONARY ANGIOPLASTY WITH STENT PLACEMENT  07/15/2017   "2 stents"  . CORONARY/GRAFT ACUTE MI REVASCULARIZATION N/A 03/12/2019   Procedure: Coronary/Graft Acute MI Revascularization;  Surgeon: Yvonne KendallEnd, Christopher, MD;  Location: [facility];  Service: Cardiovascular;  Laterality: N/A;  . LAPAROSCOPIC APPENDECTOMY N/A 11/12/2017   Procedure: APPENDECTOMY LAPAROSCOPIC, REPAIR OF INCARCERATED INCISIONAL HERNIA;  Surgeon: Karie SodaGross, Steven, MD;  Location: [facility];  Service: General;  Laterality: N/A;  . LAPAROSCOPIC CHOLECYSTECTOMY    . LEFT HEART CATH AND CORONARY ANGIOGRAPHY N/A 07/15/2017   Procedure: LEFT HEART CATH AND CORONARY ANGIOGRAPHY;  Surgeon: Tonny Bollmanooper, Michael, MD;  [facility];  Service: Cardiovascular;  Laterality: N/A;  . LEFT HEART CATH AND CORONARY ANGIOGRAPHY N/A 03/12/2019   Procedure: LEFT HEART CATH AND CORONARY ANGIOGRAPHY;  Surgeon: Yvonne KendallEnd, Christopher, MD;  Location: [facility];  Service: Cardiovascular;  Laterality: N/A;   Social History   Socioeconomic History  . Marital status: Married    Spouse name: Not on file  . Number of children: Not on file   . Years of education: Not on file  . Highest education level: Not on file  Occupational History  . Occupation: Conservation officer, natureuperintendent    Employer: [employer]  Social Needs  . Financial resource strain: Not on file  . Food insecurity    Worry: Not on file    Inability: Not on file  . Transportation needs    Medical: Not on file    Non-medical: Not on file  Tobacco Use  . Smoking status: Former Smoker    Packs/day: 0.50    Years: 26.00    Pack years: 13.00    Types: Cigarettes    Quit date: 03/07/2019    Years since quitting: 0.1  . Smokeless tobacco: Never Used  Substance and Sexual Activity  . Alcohol use: No  . Drug use: No  . Sexual activity: Yes    Birth control/protection: None  Lifestyle  . Physical activity    Days per week: Not on file    Minutes per session: Not on file  . Stress: Not on file  Relationships  . Social Musicianconnections    Talks on phone: Not on file    Gets together: Not on file    Attends religious service: Not on file    Active member of club or organization: Not on file    Attends meetings of clubs or organizations: Not on file    Relationship status: Not on file  Other Topics Concern  . Not on file  Social History Narrative  . Not on file   Outpatient Encounter Medications as of 05/02/2019  Medication Sig  .  Insulin Glargine (LANTUS) 100 UNIT/ML Solostar Pen Inject 90 Units into the skin at bedtime.  . [DISCONTINUED] Insulin Glargine (LANTUS) 100 UNIT/ML Solostar Pen Inject 90 Units into the skin at bedtime.  . ALPRAZolam (XANAX) 1 MG tablet Take 1 mg by mouth daily as needed for anxiety.  Marland Kitchen aspirin EC 81 MG tablet Take 1 tablet (81 mg total) by mouth daily with breakfast.  . atorvastatin (LIPITOR) 80 MG tablet Take 1 tablet (80 mg total) by mouth at bedtime.  . carvedilol (COREG) 3.125 MG tablet Take 1 tablet (3.125 mg total) by mouth 2 (two) times daily with a meal.  . diltiazem (CARDIZEM CD) 120 MG 24 hr capsule Take 1 capsule (120 mg  total) by mouth daily.  . Insulin Lispro (HUMALOG KWIKPEN) 200 UNIT/ML SOPN Inject 15-21 Units into the skin 3 (three) times daily before meals.  . Insulin Pen Needle (PEN NEEDLES) 31G X 8 MM MISC 1 each by Does not apply route 4 (four) times daily.  Marland Kitchen losartan (COZAAR) 50 MG tablet Take 1 tablet (50 mg total) by mouth daily.  . metFORMIN (GLUCOPHAGE-XR) 500 MG 24 hr tablet Take 1 tablet (500 mg total) by mouth 2 (two) times daily.  . nitroGLYCERIN (NITROSTAT) 0.4 MG SL tablet Place 1 tablet (0.4 mg total) under the tongue every 5 (five) minutes x 3 doses as needed for chest pain.  . Omega-3 1000 MG CAPS Take 1,000 mg by mouth 2 (two) times daily.  . ticagrelor (BRILINTA) 90 MG TABS tablet Take 1 tablet (90 mg total) by mouth 2 (two) times daily.  . [DISCONTINUED] HUMALOG KWIKPEN 200 UNIT/ML SOPN INJECT 15-21 UNITS INTO THE SKIN THREE TIMES DAILY BEFORE MEALS  . [DISCONTINUED] Insulin Degludec (TRESIBA FLEXTOUCH) 200 UNIT/ML SOPN Inject 90 Units into the skin at bedtime. (Patient not taking: Reported on 05/02/2019)  . [DISCONTINUED] Insulin Glargine, 2 Unit Dial, 300 UNIT/ML SOPN Inject 90 Units into the skin at bedtime.   No facility-administered encounter medications on file as of 05/02/2019.    ALLERGIES: Allergies  Allergen Reactions  . Bee Venom Anaphylaxis and Swelling  . Penicillins Hives, Itching and Rash    Has patient had a PCN reaction causing immediate rash, facial/tongue/throat swelling, SOB or lightheadedness with hypotension: No Has patient had a PCN reaction causing severe rash involving mucus membranes or skin necrosis: No Has patient had a PCN reaction that required hospitalization: No Has patient had a PCN reaction occurring within the last 10 years: No If all of the above answers are "NO", then may proceed with Cephalosporin use.    VACCINATION STATUS:  There is no immunization history on file for this patient.  Diabetes He presents for his follow-up diabetic visit.  He has type 2 diabetes mellitus. Onset time: He was diagnosed at approximate age of 85 years. His disease course has been improving (Was recently hospitalized due to appendicitis after 2 months of antibiotic treatment.  He is status post appendectomy.  ). There are no hypoglycemic associated symptoms. Pertinent negatives for hypoglycemia include no confusion, headaches, pallor or seizures. Pertinent negatives for diabetes include no blurred vision, no chest pain, no fatigue, no polydipsia, no polyphagia, no polyuria and no weakness. There are no hypoglycemic complications. Symptoms are improving. There are no diabetic complications. Risk factors for coronary artery disease include diabetes mellitus, dyslipidemia, family history, hypertension, male sex, obesity, tobacco exposure and sedentary lifestyle. Current diabetic treatment includes intensive insulin program (He is on Levemir 68 units BID and Humalog 10 units  TIDAC.). His weight is increasing steadily. He is following a generally unhealthy diet. When asked about meal planning, he reported none. He has not had a previous visit with a dietitian. He rarely participates in exercise. His home blood glucose trend is decreasing steadily. His breakfast blood glucose range is generally 140-180 mg/dl. His lunch blood glucose range is generally 140-180 mg/dl. His dinner blood glucose range is generally 140-180 mg/dl. His bedtime blood glucose range is generally 140-180 mg/dl. His overall blood glucose range is 140-180 mg/dl. An ACE inhibitor/angiotensin II receptor blocker is being taken. He does not see a podiatrist.Eye exam is not current.  Hyperlipidemia This is a chronic problem. The current episode started more than 1 year ago. The problem is controlled. Exacerbating diseases include diabetes and obesity. Pertinent negatives include no chest pain, myalgias or shortness of breath. Current antihyperlipidemic treatment includes statins and bile acid squestrants. The  current treatment provides significant improvement of lipids. Risk factors for coronary artery disease include diabetes mellitus, dyslipidemia, hypertension, male sex, obesity and a sedentary lifestyle.  Hypertension This is a chronic problem. The current episode started more than 1 year ago. Pertinent negatives include no blurred vision, chest pain, headaches, neck pain, palpitations or shortness of breath. Risk factors for coronary artery disease include dyslipidemia, diabetes mellitus, male gender, smoking/tobacco exposure and sedentary lifestyle. Past treatments include ACE inhibitors.     Review of Systems  Constitutional: Negative for chills, fatigue, fever and unexpected weight change.  HENT: Negative for dental problem, mouth sores and trouble swallowing.   Eyes: Negative for blurred vision and visual disturbance.  Respiratory: Negative for cough, choking, chest tightness, shortness of breath and wheezing.   Cardiovascular: Negative for chest pain, palpitations and leg swelling.  Gastrointestinal: Negative for abdominal distention, abdominal pain, constipation, diarrhea, nausea and vomiting.  Endocrine: Negative for polydipsia, polyphagia and polyuria.  Genitourinary: Negative for dysuria, flank pain, hematuria and urgency.  Musculoskeletal: Negative for back pain, gait problem, myalgias and neck pain.  Skin: Negative for pallor, rash and wound.  Neurological: Negative for seizures, syncope, weakness, numbness and headaches.  Psychiatric/Behavioral: Negative for confusion and dysphoric mood.    Objective:    BP (!) 151/83 (BP Location: Left Arm, Patient Position: Sitting, Cuff Size: Normal)   Pulse 74   Temp 97.6 F (36.4 C) (Oral)   Ht  (1.803 m)   Wt 291 lb 6.4 oz (132.2 kg)   SpO2 97%   BMI 40.64 kg/m   Wt Readings from Last 3 Encounters:  05/02/19 291 lb 6.4 oz (132.2 kg)  04/25/19 286 lb (129.7 kg)  04/04/19 283 lb 15.2 oz (128.8 kg)    CMP ( most  recent) CMP     Component Value Date/Time   NA 140 04/29/2019 0721   NA 141 07/07/2017 0837   K 4.2 04/29/2019 0721   CL 105 04/29/2019 0721   CO2 29 04/29/2019 0721   GLUCOSE 120 (H) 04/29/2019 0721   BUN 14 04/29/2019 0721   BUN 15 07/07/2017 0837   CREATININE 1.09 04/29/2019 0721   CALCIUM 9.6 04/29/2019 0721   PROT 6.3 04/29/2019 0721   ALBUMIN 3.7 04/04/2019 0208   AST 21 04/29/2019 0721   ALT 36 04/29/2019 0721   ALKPHOS 90 04/04/2019 0208   BILITOT 0.7 04/29/2019 0721   GFRNONAA 83 04/29/2019 0721   GFRAA 96 04/29/2019 0721     Diabetic Labs (most recent): Lab Results  Component Value Date   HGBA1C 7.4 (H) 04/29/2019  HGBA1C 7.7 (H) 03/13/2019   HGBA1C 8.3 (H) 11/19/2018    Lipid Panel     Component Value Date/Time   CHOL 124 03/13/2019 0547   TRIG 234 (H) 03/13/2019 0547   HDL 20 (L) 03/13/2019 0547   CHOLHDL 6.2 03/13/2019 0547   VLDL 47 (H) 03/13/2019 0547   LDLCALC 57 03/13/2019 0547   LDLCALC 63 11/19/2018 0822     Assessment & Plan:   1. Uncontrolled type 2 diabetes mellitus with chronic kidney disease, coronary artery disease, with long-term current use of insulin.  - Patient has currently uncontrolled symptomatic type 2 DM since  43 years of age. Lance [name] comes with increase in his A1c to 7.4%.  He is currently glycemic profile are near target for both fasting and postprandial.     Recent labs reviewed. -his diabetes is complicated by obesity/sedentary life, chronic heavy smoking and Lance [name] remains at a high risk for more acute and chronic complications which include CAD, CVA, CKD, retinopathy, and neuropathy. These are all discussed in detail with the patient.  - I have counseled him on diet management and weight loss, by adopting a carbohydrate restricted/protein rich diet.  - he  admits there is a room for improvement in his diet and drink choices. -  Suggestion is made for him to avoid simple carbohydrates  from his diet including  Cakes, Sweet Desserts / Pastries, Ice Cream, Soda (diet and regular), Sweet Tea, Candies, Chips, Cookies, Sweet Pastries,  Store Bought Juices, Alcohol in Excess of  1-2 drinks a day, Artificial Sweeteners, Coffee Creamer, and "Sugar-free" Products. This will help patient to have stable blood glucose profile and potentially avoid unintended weight gain.   - I encouraged him to switch to  unprocessed or minimally processed complex starch and increased protein intake (animal or plant source), fruits, and vegetables.  - he is advised to stick to a routine mealtimes to eat 3 meals  a day and avoid unnecessary snacks ( to snack only to correct hypoglycemia).   - I have approached him with the following individualized plan to manage diabetes and patient agrees:   -His presentation is such that he will continue to require intensive treatment with basal/bolus insulin in order for him to achieve and maintain control of diabetes.  -His insurance switched his Evaristo Buryresiba to Lantus, advised to continue  90 units nightly, continue Humalog 15-21  units 3 times daily before meals, associated with strict monitoring of blood glucose 4 times a day-before meals and at bedtime .  -Patient is encouraged to call clinic for blood glucose levels less than 70 or above 200 mg /dl. -He did not afford the co-pays for  continuous glucose monitoring.  -He is advised to continue metformin 500 mg ER twice a day after breakfast and supper.    -He declined offer for weekly incretin injection therapy.  - Patient specific target  A1c;  LDL, HDL, Triglycerides, and  Waist Circumference were discussed in detail.  2) BP/HTN: His blood pressure is not controlled to target.  He is advised to continue  with his current blood pressure medications including lisinopril 40 mg p.o. daily along with his carvedilol 3.125 mg p.o. twice daily.    3) Lipids: His recent lipid panel showed controlled LDL of 63.  He has benefited from statin therapy.   He is advised to continue omega-3 fatty acids 1000 units p.o. twice daily, as well as Lipitor 80 mg p.o. nightly.     4)  Weight/Diet:  CDE Consult has been initiated  , exercise, and detailed carbohydrates information provided.  He is an ideal candidate for bariatric surgery.  He declined bariatric surgery as an option for weight control.    5) Chronic Care/Health Maintenance:  -he  is on ACEI/ARB and Statin medications and  is encouraged to continue to follow up with Ophthalmology, Dentist,  Podiatrist at least yearly or according to recommendations, and advised to  quit smoking. I have recommended yearly flu vaccine and pneumonia vaccination at least every 5 years; moderate intensity exercise for up to 150 minutes weekly; and  sleep for at least 7 hours a day.  - I advised patient to maintain close follow up with Practice, [facility] for primary care needs.  - Patient Care Time Today:  25 min, of which >50% was spent in reviewing his  current and  previous labs/studies, previous treatments, and medications doses and developing a plan for long-term care based on the latest recommendations for standards of care.  Lance [name] participated in the discussions, expressed understanding, and voiced agreement with the above plans.  All questions were answered to his satisfaction. he is encouraged to contact clinic should he have any questions or concerns prior to his return visit.   Follow up plan: - Return in about 4 months (around 09/02/2019) for Follow up with Pre-visit Labs, Meter, and Logs.  Marquis LunchGebre Daelen Belvedere, MD Phone: [phone]  Fax: [phone]  -  This note was partially dictated with voice recognition software. Similar sounding words can be transcribed inadequately or may not  be corrected upon review.  05/02/2019, 4:32 PM

## 2019-05-04 ENCOUNTER — Ambulatory Visit: Payer: 59 | Admitting: Cardiology

## 2019-06-20 ENCOUNTER — Telehealth: Payer: Self-pay | Admitting: *Deleted

## 2019-06-20 ENCOUNTER — Other Ambulatory Visit: Payer: Self-pay

## 2019-06-20 ENCOUNTER — Encounter: Payer: Self-pay | Admitting: Cardiology

## 2019-06-20 ENCOUNTER — Ambulatory Visit (INDEPENDENT_AMBULATORY_CARE_PROVIDER_SITE_OTHER): Payer: 59 | Admitting: Cardiology

## 2019-06-20 ENCOUNTER — Telehealth: Payer: Self-pay | Admitting: Cardiology

## 2019-06-20 VITALS — BP 162/74 | HR 76 | Ht 71.0 in | Wt 294.0 lb

## 2019-06-20 DIAGNOSIS — I471 Supraventricular tachycardia: Secondary | ICD-10-CM

## 2019-06-20 DIAGNOSIS — I25119 Atherosclerotic heart disease of native coronary artery with unspecified angina pectoris: Secondary | ICD-10-CM | POA: Diagnosis not present

## 2019-06-20 DIAGNOSIS — E782 Mixed hyperlipidemia: Secondary | ICD-10-CM | POA: Diagnosis not present

## 2019-06-20 DIAGNOSIS — I255 Ischemic cardiomyopathy: Secondary | ICD-10-CM | POA: Diagnosis not present

## 2019-06-20 MED ORDER — SPIRONOLACTONE 25 MG PO TABS: 12.5000 mg | ORAL_TABLET | Freq: Every day | ORAL | 6 refills | Status: DC

## 2019-06-20 NOTE — Progress Notes (Signed)
Cardiology Office Note  Date: 06/20/2019   ID: Lance [name], DOB 1976-08-21, MRN [id number]  PCP:  [facility]  Cardiologist:  Rozann Lesches, MD Electrophysiologist:  None   Chief Complaint  Patient presents with  . Cardiac follow-up    History of Present Illness: Lance [name] is a 43 y.o. male last seen in July by Ms. Bonnell Public PA-C.  He was hospitalized in June at [facility] with an episode of symptomatic SVT, likely AVRT.  He was placed on Cardizem CD 120 mg daily instead of short acting Cardizem, otherwise continued on low-dose Coreg (he did not tolerate higher dose Coreg previously).  He presents for follow-up today, denies any recurrent palpitations.  He states that he had to stop the Cardizem CD however due to fatigue and waking up at nighttime.  The symptoms have resolved.  He does not describe any recurring angina and remains on dual antiplatelet therapy.  Also tolerating low-dose Coreg, losartan, and Lipitor.  Blood pressure is not optimally controlled.  He remains significantly overweight.  His last echocardiogram was in June around the time of anterior STEMI and placement of DES to the LAD with staged PTCA of a restenosed OM.  We discussed obtaining a follow-up echocardiogram.  Past Medical History:  Diagnosis Date  . CAD (coronary artery disease), native coronary artery    10/18 PCI/DES to mRCA, and OM, with total occlusion of dLCx with collaterals.   . Essential hypertension   . GERD (gastroesophageal reflux disease)   . History of kidney stones   . Hyperlipemia   . SVT (supraventricular tachycardia) ([place]) 2015   Converted with adenosine  . Type 2 diabetes mellitus ([place])     Past Surgical History:  Procedure Laterality Date  . CARDIAC SURGERY    . CORONARY ANGIOPLASTY WITH STENT PLACEMENT  07/15/2017   "2 stents"  . CORONARY/GRAFT ACUTE MI REVASCULARIZATION N/A 03/12/2019   Procedure: Coronary/Graft Acute MI Revascularization;  Surgeon: Nelva Bush, MD;  Location: [place] [facility];  Service: Cardiovascular;  Laterality: N/A;  . LAPAROSCOPIC APPENDECTOMY N/A 11/12/2017   Procedure: APPENDECTOMY LAPAROSCOPIC, REPAIR OF INCARCERATED INCISIONAL HERNIA;  Surgeon: Michael Boston, MD;  Location: [facility];  Service: General;  Laterality: N/A;  . LAPAROSCOPIC CHOLECYSTECTOMY    . LEFT HEART CATH AND CORONARY ANGIOGRAPHY N/A 07/15/2017   Procedure: LEFT HEART CATH AND CORONARY ANGIOGRAPHY;  Surgeon: Sherren Mocha, MD;  Location: [place] [facility];  Service: Cardiovascular;  Laterality: N/A;  . LEFT HEART CATH AND CORONARY ANGIOGRAPHY N/A 03/12/2019   Procedure: LEFT HEART CATH AND CORONARY ANGIOGRAPHY;  Surgeon: Nelva Bush, MD;  Location: [place] [facility];  Service: Cardiovascular;  Laterality: N/A;    Current Outpatient Medications  Medication Sig Dispense Refill  . ALPRAZolam (XANAX) 1 MG tablet Take 1 mg by mouth daily as needed for anxiety.    Marland Kitchen aspirin EC 81 MG tablet Take 1 tablet (81 mg total) by mouth daily with breakfast. 120 tablet 5  . atorvastatin (LIPITOR) 80 MG tablet Take 1 tablet (80 mg total) by mouth at bedtime. 90 tablet 3  . carvedilol (COREG) 3.125 MG tablet Take 1 tablet (3.125 mg total) by mouth 2 (two) times daily with a meal. 180 tablet 5  . Insulin Glargine (LANTUS) 100 UNIT/ML Solostar Pen Inject 90 Units into the skin at bedtime. 10 pen 2  . Insulin Lispro (HUMALOG KWIKPEN) 200 UNIT/ML SOPN Inject 15-21 Units into the skin 3 (three) times daily before meals.  10 pen 2  . Insulin Pen Needle (PEN NEEDLES) 31G X 8 MM MISC 1 each by Does not apply route 4 (four) times daily. 400 each 1  . losartan (COZAAR) 50 MG tablet Take 1 tablet (50 mg total) by mouth daily. 30 tablet 5  . metFORMIN (GLUCOPHAGE-XR) 500 MG 24 hr tablet Take 1 tablet (500 mg total) by mouth 2 (two) times daily. 180 tablet 1  . nitroGLYCERIN (NITROSTAT) 0.4 MG SL tablet Place 1 tablet (0.4 mg total) under the tongue every 5 (five) minutes x  3 doses as needed for chest pain. 25 tablet 1  . Omega-3 1000 MG CAPS Take 1,000 mg by mouth 2 (two) times daily.    . ticagrelor (BRILINTA) 90 MG TABS tablet Take 1 tablet (90 mg total) by mouth 2 (two) times daily. 180 tablet 2  . spironolactone (ALDACTONE) 25 MG tablet Take 0.5 tablets (12.5 mg total) by mouth daily. 15 tablet 6   No current facility-administered medications for this visit.    Allergies:  Bee venom and Penicillins   Social History: The patient  reports that he quit smoking about 3 months ago. His smoking use included cigarettes. He has a 13.00 pack-year smoking history. He has never used smokeless tobacco. He reports that he does not drink alcohol or use drugs.   ROS:  Please see the history of present illness. Otherwise, complete review of systems is positive for none.  All other systems are reviewed and negative.   Physical Exam: VS:  BP (!) 162/74   Pulse 76   Ht 5\' 11"  (1.803 m)   Wt 294 lb (133.4 kg)   SpO2 97%   BMI 41.00 kg/m , BMI Body mass index is 41 kg/m.  Wt Readings from Last 3 Encounters:  06/20/19 294 lb (133.4 kg)  05/02/19 291 lb 6.4 oz (132.2 kg)  04/25/19 286 lb (129.7 kg)    General: Obese male, no distress.  HEENT: Conjunctiva and lids normal, wearing a mask. Neck: Supple, no elevated JVP or carotid bruits, no thyromegaly. Lungs: Clear to auscultation, nonlabored breathing at rest. Cardiac: Regular rate and rhythm, no S3 or significant systolic murmur. Abdomen: Soft, nontender, bowel sounds present. Extremities: No pitting edema, distal pulses 2+. Skin: Warm and dry. Musculoskeletal: No kyphosis. Neuropsychiatric: Alert and oriented x3, affect grossly appropriate.  ECG:  An ECG dated 04/03/2019 was personally reviewed today and demonstrated:  SVT, long RP tachycardia possibly AVRT.  Recent Labwork: 04/03/2019: Magnesium 1.9; TSH 3.430 04/04/2019: Hemoglobin 13.8; Platelets 196 04/29/2019: ALT 36; AST 21; BUN 14; Creat 1.09; Potassium  4.2; Sodium 140     Component Value Date/Time   CHOL 124 03/13/2019 0547   TRIG 234 (H) 03/13/2019 0547   HDL 20 (L) 03/13/2019 0547   CHOLHDL 6.2 03/13/2019 0547   VLDL 47 (H) 03/13/2019 0547   LDLCALC 57 03/13/2019 0547   LDLCALC 63 11/19/2018 0822    Other Studies Reviewed Today:  Cardiac catheterization 03/12/2019: Conclusions: 1. Severe two-vessel CAD with 90% stenosis of proximal LAD just beyond D1 with thrombos (some of which had embolized to the apical LAD) and 99% in-stent restenosis of OM1 and 90% stenosis of jailed mid LCx. 2. Mild in-stent restenosis involving the mid RCA. 3. Moderately elevated left ventricular filling pressure with suggestion of anterior hypokinesis on the left ventriculogram (suboptimal opacification with hand-injection). 4. Successful PCI to proximal LAD using Synergy 3.0 x 2 mm DES (post-dilated proximally to 4.0 mm) with 0% residual stenosis and TIMI-3  flow. 5. Success balloon angioplasty to LCx/OM1 stent using Cucumber Euphora 2.25 x 8 mm and Sapphire 2.5 x 12 mm balloons with reduction in stenosis of OM1 from 99% to 40% and mid LCx from 90% to 60% and restoration of TIMI-3 flow.  Given improving chest pain, relatively small vessel size with need for complex bifurcation intervention, and radiation/contrast doses administered to that point, decision was made to forego additional intervention to this vessel.  Recommendations: 1. Continue tirofiban infusion x 18 hours. 2. Dual antiplatelet therapy with aspirin and ticagrelor for at least 12 months, ideally longer. 3. Aggressive secondary prevention. 4. Obtain transthoracic echocardiogram to assess LV function. 5. If patient has refractory chest pain, complex bifurcation intervention to previously stented LCx/OM1 and AV groove LCx could be considered, though I worry about restenosis in the future given aggressive restenosis since prior PCI in 2018, relatively small vessel size, and likely need for culotte stenting  of the LCx.  Echocardiogram 03/13/2019:  1. Severe akinesis of the left ventricular, apical anteroseptal wall.  2. The left ventricle has mild-moderately reduced systolic function, with an ejection fraction of 40-45%. The cavity size was normal. Left ventricular diastolic Doppler parameters are consistent with pseudonormalization.  3. The right ventricle has normal systolic function. The cavity was normal. There is no increase in right ventricular wall thickness.  4. No stenosis of the aortic valve.  5. The aortic root and ascending aorta are normal in size and structure.  6. The interatrial septum was not assessed.  Assessment and Plan:  1.  History of SVT, likely AVRT.  He is asymptomatic at this time.  Unfortunately, he did not tolerate Cardizem CD.  We will continue Coreg at present dose, he did not tolerate higher dose in the past.  If this becomes a recurring problem, we will need to seek EP consultation.  2.  They with LVEF 40 to 45% by echocardiogram in June around the time of ACS.  We will plan a follow-up study.  3.  CAD status post multiple PCI's, most recently anterior STEMI complicated by VF arrest and placement of DES to the LAD followed by staged PTCA of a restenosed OM in June.  Continue medical therapy including dual antiplatelet regimen, Coreg, Lipitor, and losartan.  Also adding Aldactone.  4.  Essential hypertension, not optimally controlled.  Adding Aldactone.  May need to uptitrate losartan eventually.  Medication Adjustments/Labs and Tests Ordered: Current medicines are reviewed at length with the patient today.  Concerns regarding medicines are outlined above.   Tests Ordered: Orders Placed This Encounter  Procedures  . ECHOCARDIOGRAM COMPLETE    Medication Changes: Meds ordered this encounter  Medications  . spironolactone (ALDACTONE) 25 MG tablet    Sig: Take 0.5 tablets (12.5 mg total) by mouth daily.    Dispense:  15 tablet    Refill:  6    Stopping  Diltiazem, new med 06/20/2019    Disposition:  Follow up 3 months in the [place] office.  Signed, Jonelle Sidle, MD, [facility] 06/20/2019 4:53 PM    [facility] at [facility] [address] [place], [place], [place] [address] Phone: [phone]; Fax: [phone]

## 2019-06-20 NOTE — Telephone Encounter (Signed)
°  Precert needed for: echo - ischemic cardiomyopathy   Location: [facility]    Date: Jul 20, 2019

## 2019-06-20 NOTE — Patient Instructions (Addendum)
Medication Instructions:   Stop Diltiazem (Cardizem CD).    Begin Spironolactone 12.5mg  daily. (will have to take 1/2 tab of 25mg )  Continue all other medications.    Labwork: none  Testing/Procedures:  Your physician has requested that you have an echocardiogram. Echocardiography is a painless test that uses sound waves to create images of your heart. It provides your doctor with information about the size and shape of your heart and how well your heart's chambers and valves are working. This procedure takes approximately one hour. There are no restrictions for this procedure.  Office will contact with results via phone or letter.    Follow-Up: 3 months   Any Other Special Instructions Will Be Listed Below (If Applicable).  If you need a refill on your cardiac medications before your next appointment, please call your pharmacy.

## 2019-06-20 NOTE — Telephone Encounter (Addendum)
OPTIMIZE Research study: 2 year follow up call : pt will call me back later he is currently at work. Confirmation received info from OV accurate. Cardizem stop per patient date unknown mid september possibly.  No chest pain.  Next research related telephone visit will be due in 1 year (14-Jun-2020---13-Aug-2020)

## 2019-07-04 ENCOUNTER — Ambulatory Visit: Payer: 59 | Admitting: Cardiology

## 2019-07-08 ENCOUNTER — Other Ambulatory Visit: Payer: Self-pay | Admitting: "Endocrinology

## 2019-07-20 ENCOUNTER — Other Ambulatory Visit: Payer: 59

## 2019-07-21 ENCOUNTER — Other Ambulatory Visit: Payer: Self-pay | Admitting: "Endocrinology

## 2019-07-26 ENCOUNTER — Other Ambulatory Visit: Payer: Self-pay

## 2019-07-26 ENCOUNTER — Telehealth: Payer: Self-pay | Admitting: Cardiology

## 2019-07-26 ENCOUNTER — Ambulatory Visit (INDEPENDENT_AMBULATORY_CARE_PROVIDER_SITE_OTHER): Payer: 59

## 2019-07-26 DIAGNOSIS — I255 Ischemic cardiomyopathy: Secondary | ICD-10-CM | POA: Diagnosis not present

## 2019-07-26 NOTE — Telephone Encounter (Signed)
Patient forgot he had new insurance and didn't think to let us know until after his test today  His primary is now [employer] and secondary [facility]

## 2019-08-02 ENCOUNTER — Other Ambulatory Visit: Payer: Self-pay

## 2019-08-02 MED ORDER — INSULIN DETEMIR 100 UNIT/ML FLEXPEN: 90.0000 [IU] | PEN_INJECTOR | Freq: Every day | SUBCUTANEOUS | 2 refills | Status: DC

## 2019-08-02 MED ORDER — INSULIN ASPART 100 UNIT/ML FLEXPEN: 15.0000 [IU] | PEN_INJECTOR | Freq: Three times a day (TID) | SUBCUTANEOUS | 2 refills | Status: DC

## 2019-08-18 ENCOUNTER — Other Ambulatory Visit: Payer: Self-pay | Admitting: Cardiology

## 2019-08-18 MED ORDER — LOSARTAN POTASSIUM 50 MG PO TABS: 50.0000 mg | ORAL_TABLET | Freq: Every day | ORAL | 1 refills | Status: DC

## 2019-08-18 MED ORDER — CARVEDILOL 3.125 MG PO TABS: 3.1250 mg | ORAL_TABLET | Freq: Two times a day (BID) | ORAL | 1 refills | Status: DC

## 2019-08-18 MED ORDER — ATORVASTATIN CALCIUM 80 MG PO TABS: 80.0000 mg | ORAL_TABLET | Freq: Every day | ORAL | 1 refills | Status: DC

## 2019-08-18 MED ORDER — SPIRONOLACTONE 25 MG PO TABS: 12.5000 mg | ORAL_TABLET | Freq: Every day | ORAL | 1 refills | Status: DC

## 2019-08-18 MED ORDER — TICAGRELOR 90 MG PO TABS: 90.0000 mg | ORAL_TABLET | Freq: Two times a day (BID) | ORAL | 1 refills | Status: DC

## 2019-08-18 NOTE — Telephone Encounter (Signed)
NEEDS  All cardiac meds sent to [facility] in [place] - he has had to switch pharmacies and needs refills today please

## 2019-08-18 NOTE — Telephone Encounter (Signed)
Medication sent to pharmacy  

## 2019-08-20 IMAGING — DX DG CHEST 2V
2 series · 2 of 2 positions shown · non-contrast
Comparison: None.

CLINICAL DATA: Chest pain.

EXAM:
CHEST  2 VIEW

[chest lat]
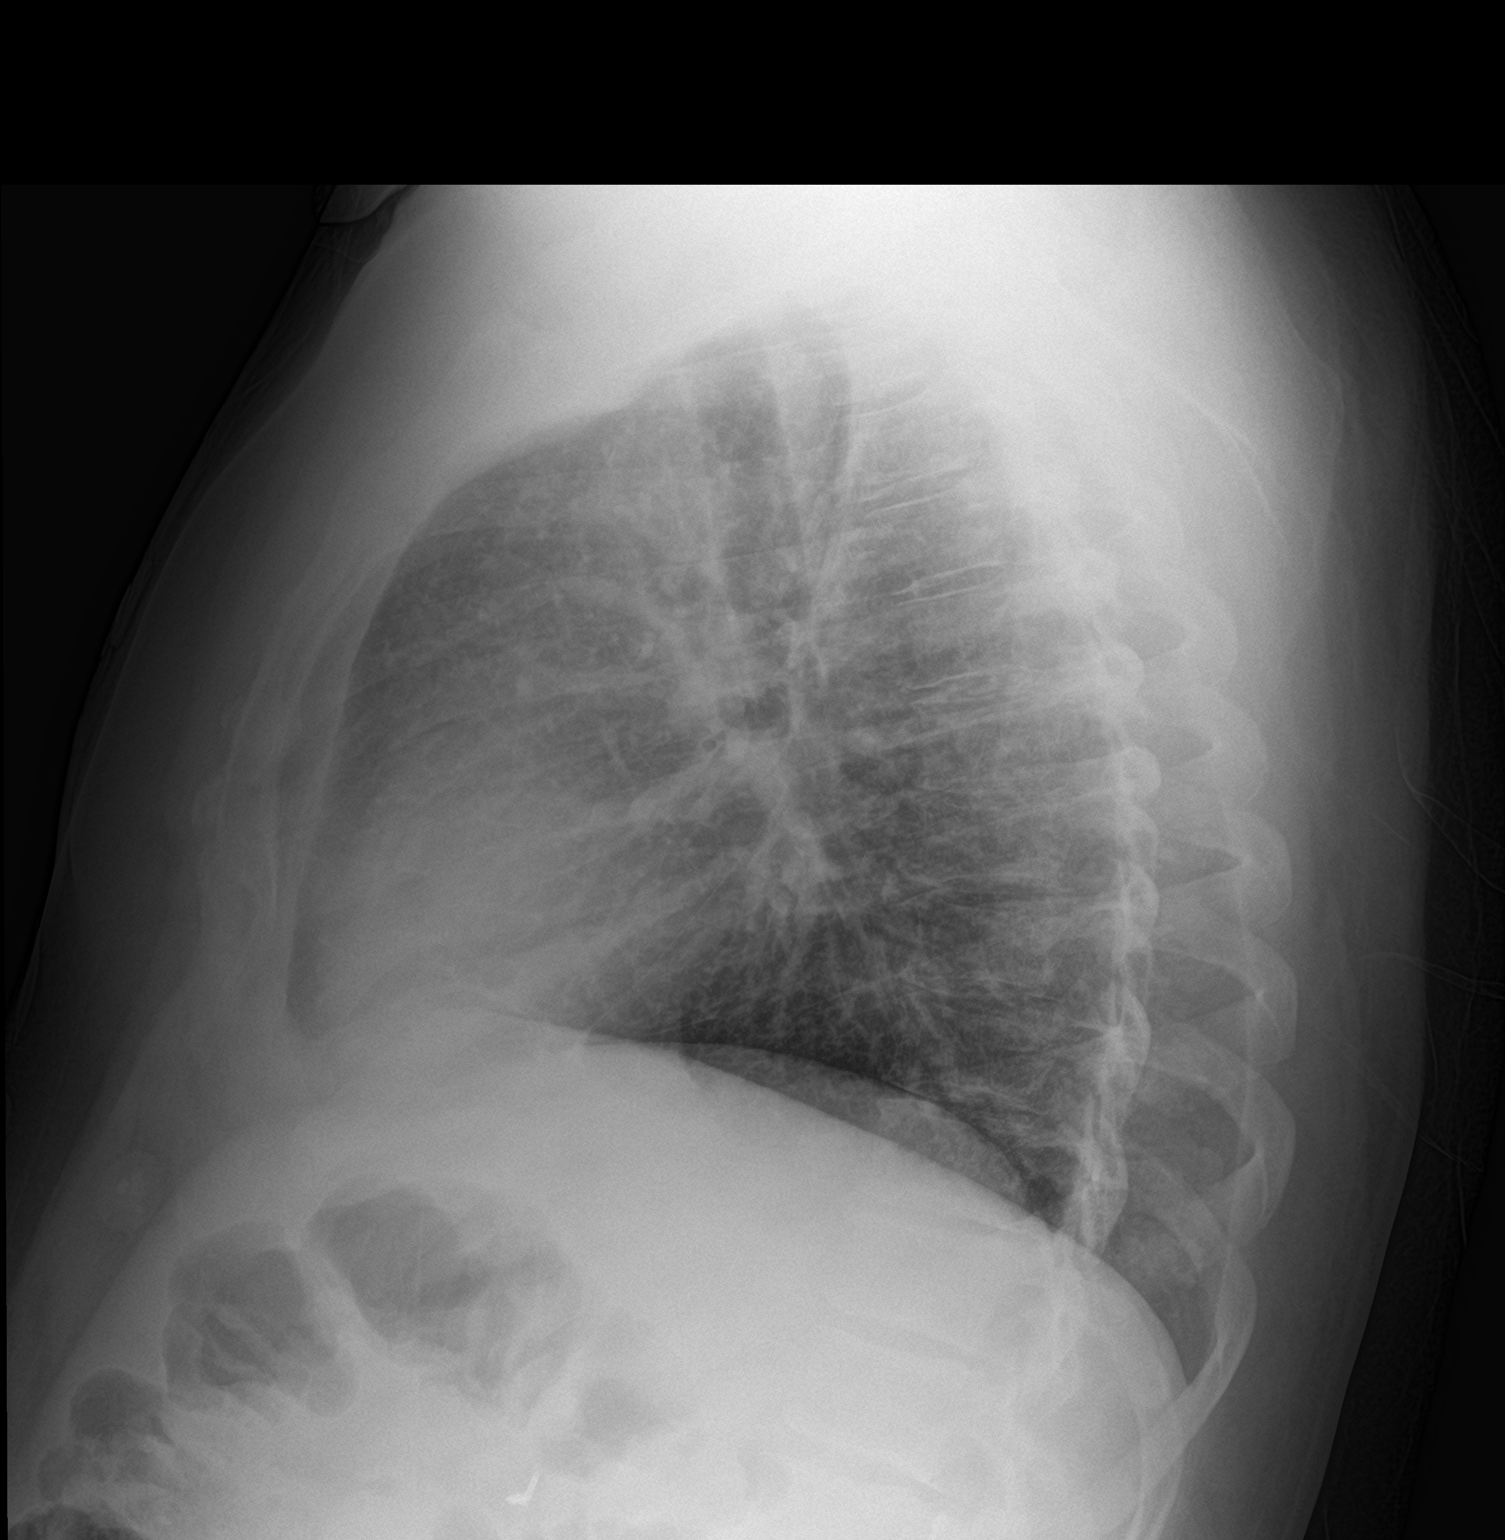

[chest pa]
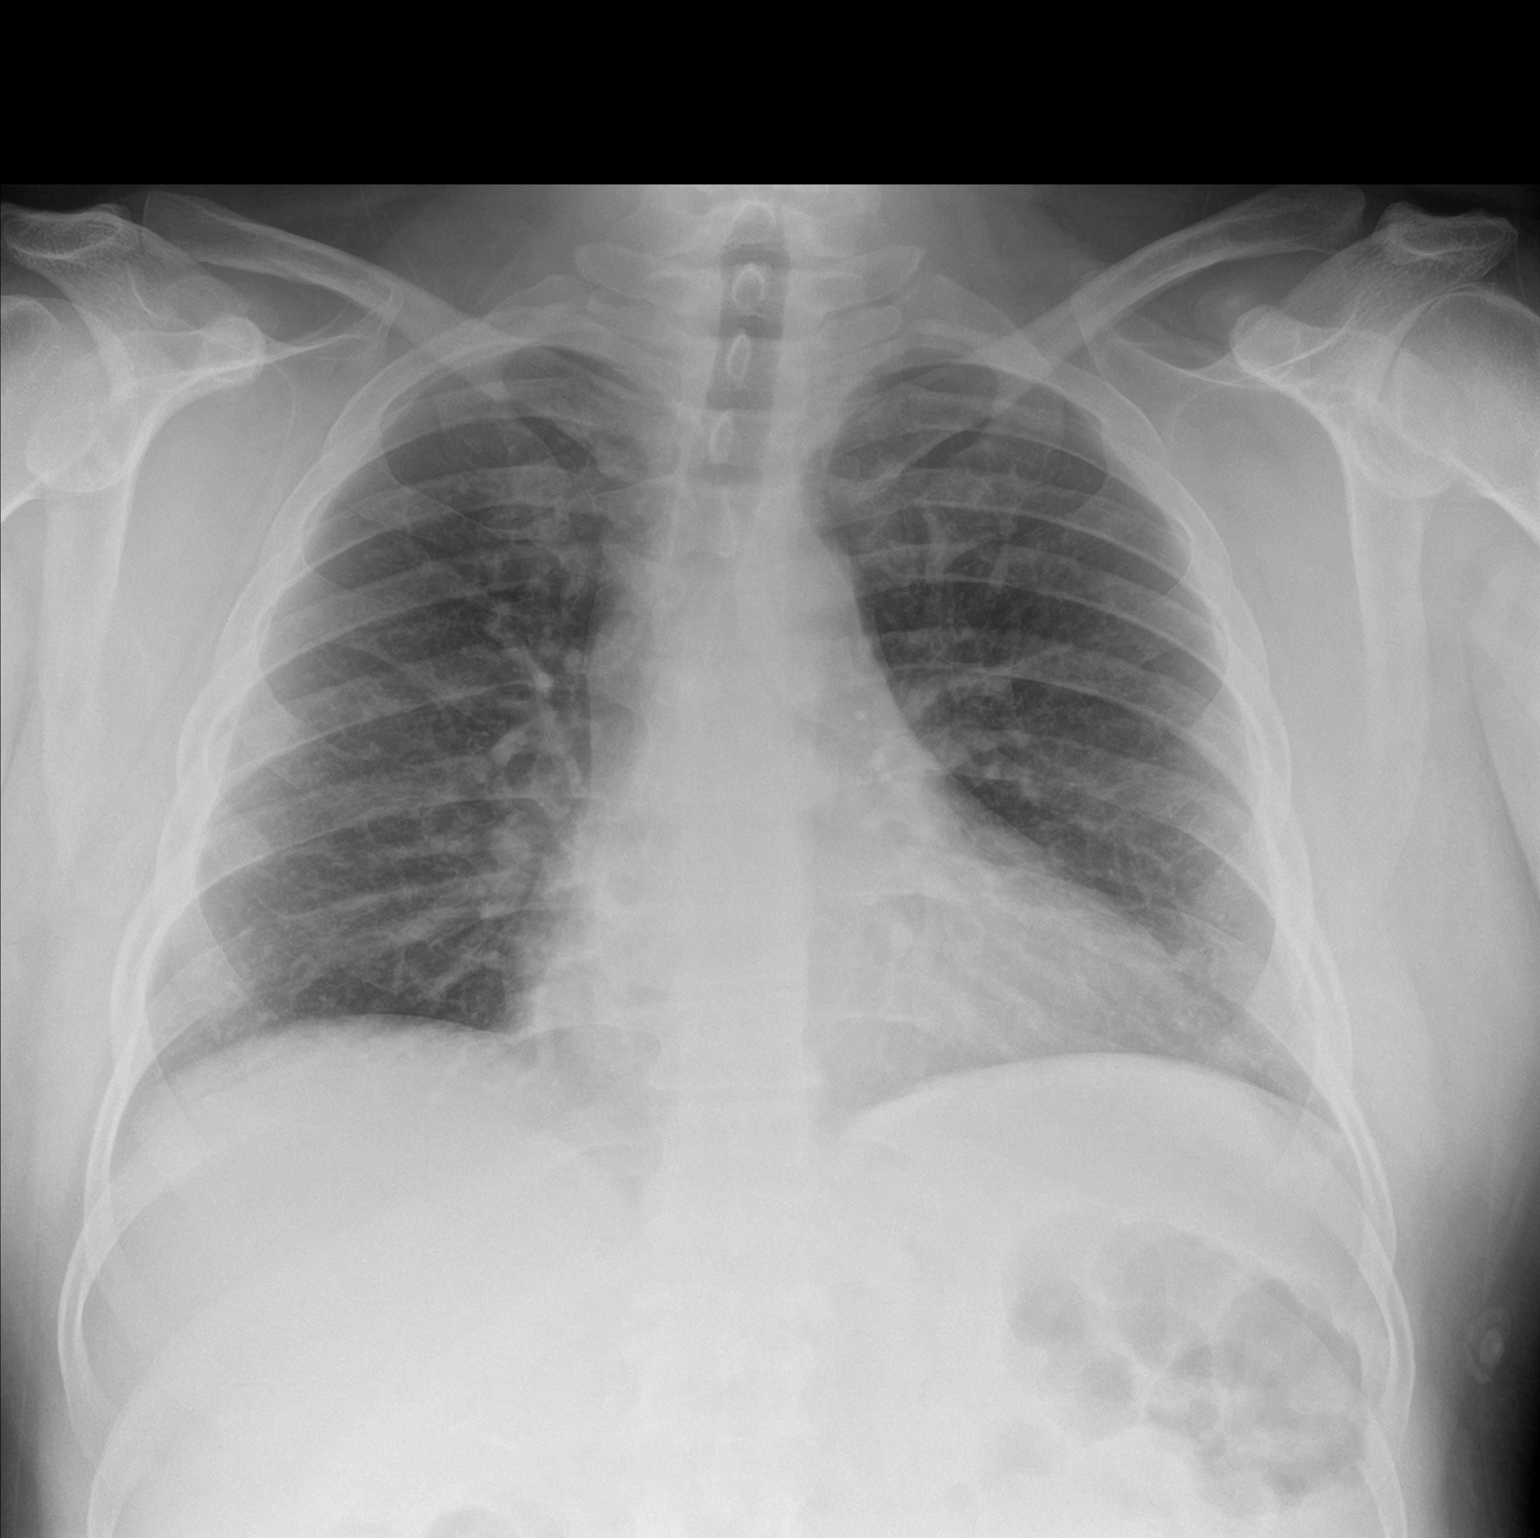

[2 of 2 positions shown; findings below may reference images not displayed]

FINDINGS: The heart size and mediastinal contours are within normal limits.
Both lungs are clear. The visualized skeletal structures are
unremarkable.
IMPRESSION: No active cardiopulmonary disease.

## 2019-08-20 IMAGING — CT CT ABD-PELV W/ CM
2 of 5 series · 16 of 46 positions shown, 18 images · IV contrast (Isovue)
Comparison: CT 05/26/2014

CLINICAL DATA: Right-sided abdominal pain.

EXAM:
CT ABDOMEN AND PELVIS WITH CONTRAST
TECHNIQUE: Multidetector CT imaging of the abdomen and pelvis was performed
using the standard protocol following bolus administration of
intravenous contrast.
CONTRAST:  100mL [id number] IOPAMIDOL ([id number]) INJECTION 61%

[Series 2: axial st · axial · 0.82mm/px · z∈[+835,+1285]mm · 13 of 102 slices shown, 15 images]
[im 6/102  soft-tissue]
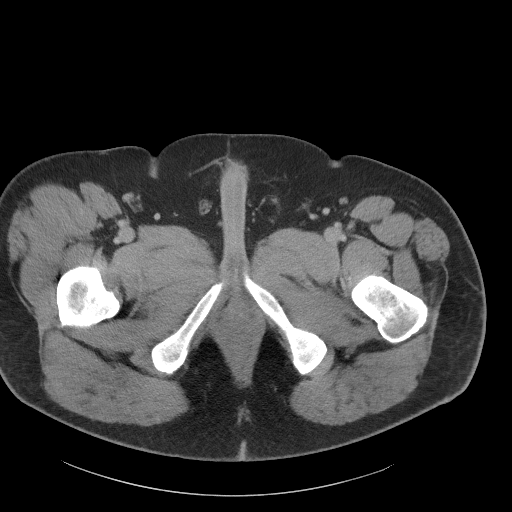
[im 6/102  bone]
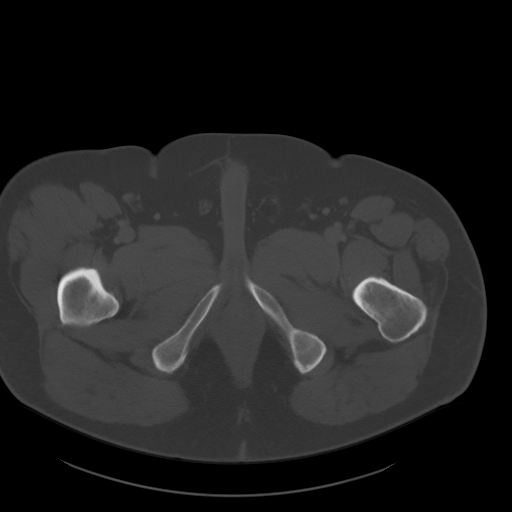
[im 16/102  soft-tissue]
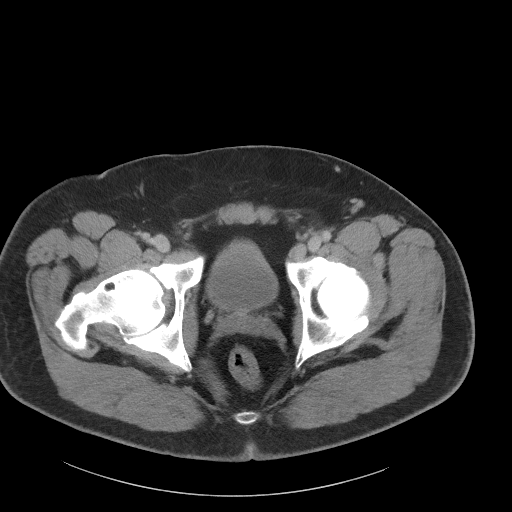
[im 22/102  soft-tissue]
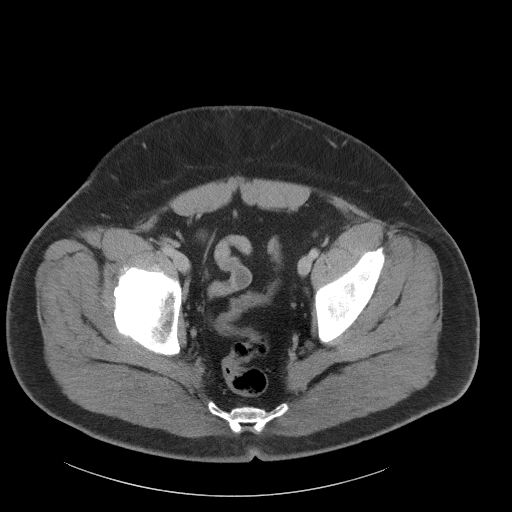
[im 27/102  soft-tissue]
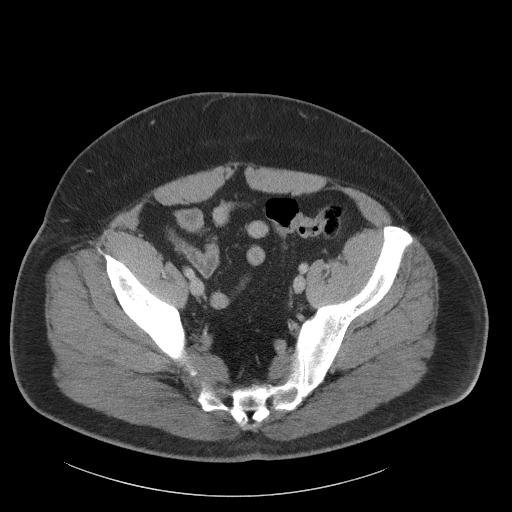
[im 38/102  soft-tissue]
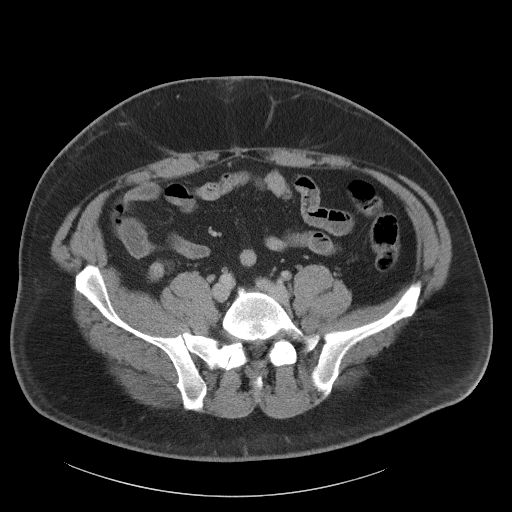
[im 43/102  soft-tissue]
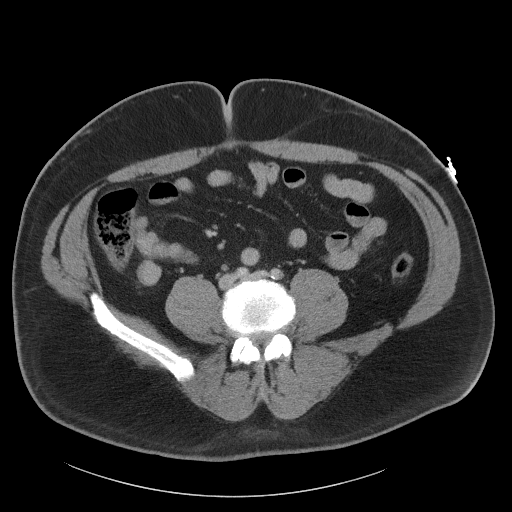
[im 54/102  soft-tissue]
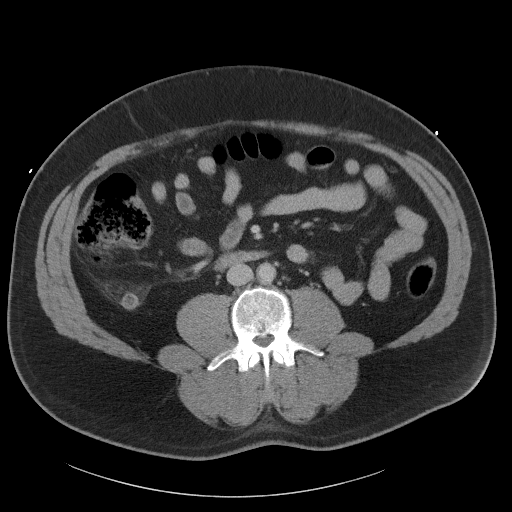
[im 59/102  soft-tissue]
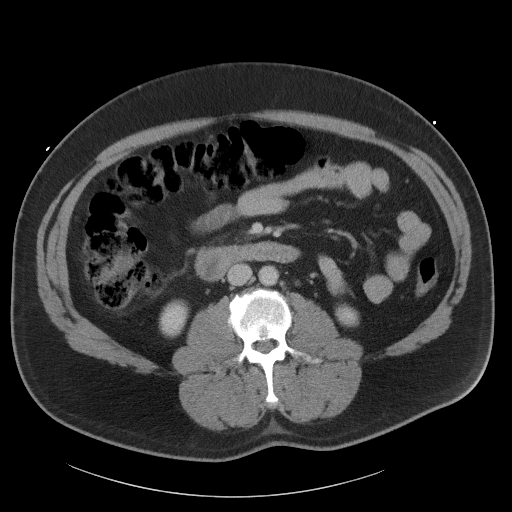
[im 64/102  soft-tissue]
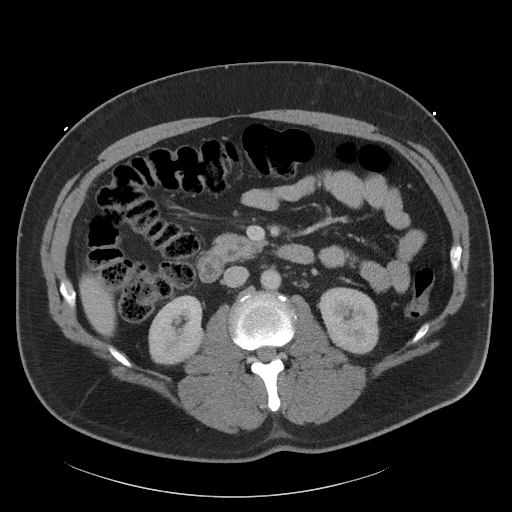
[im 64/102  bone]
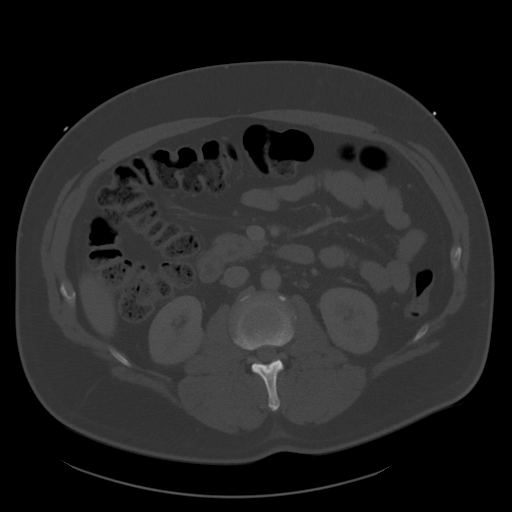
[im 75/102  soft-tissue]
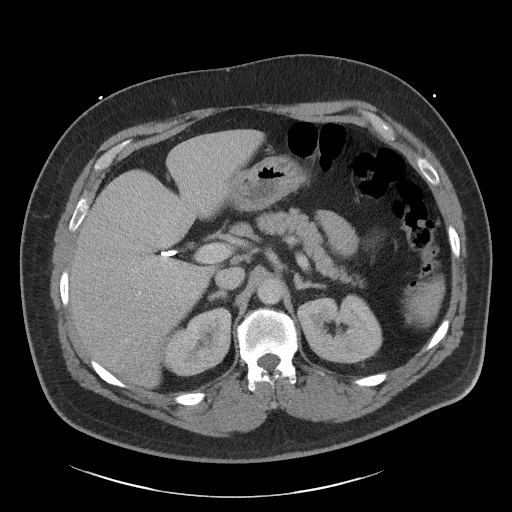
[im 80/102  soft-tissue]
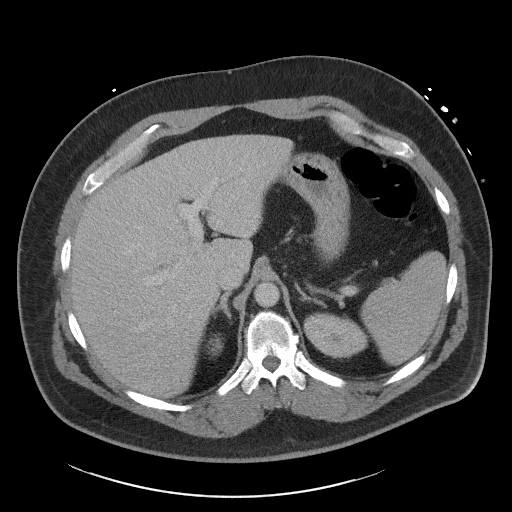
[im 86/102  soft-tissue]
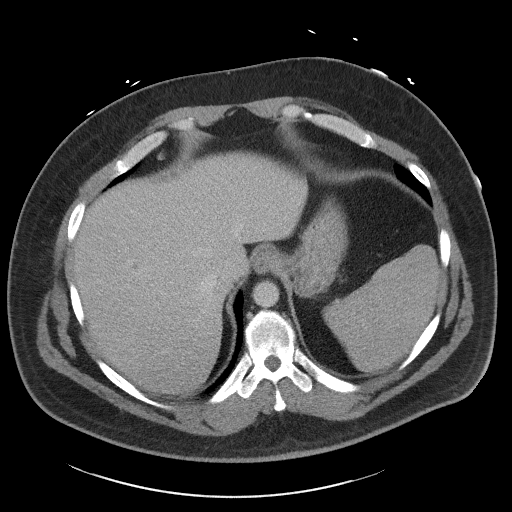
[im 96/102  soft-tissue]
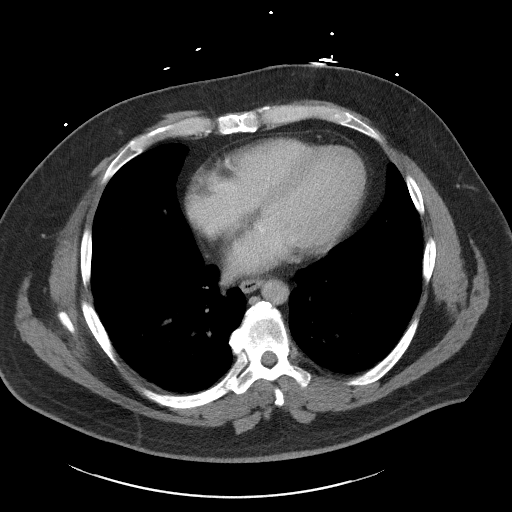

[Series 5: coronal st · coronal · 0.89mm/px · 3 of 118 slices shown]
[im 40/118  soft-tissue]
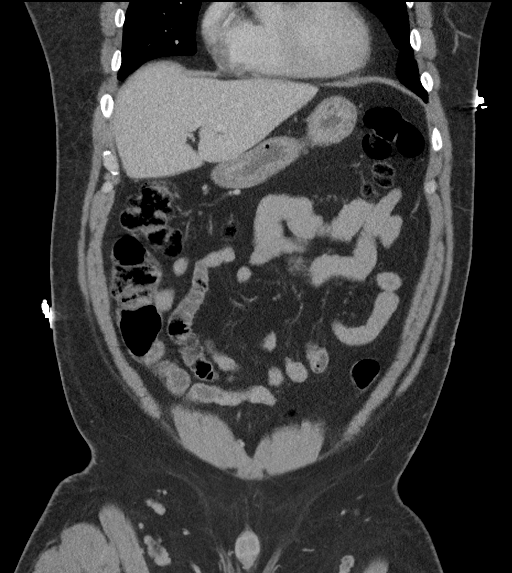
[im 53/118  soft-tissue]
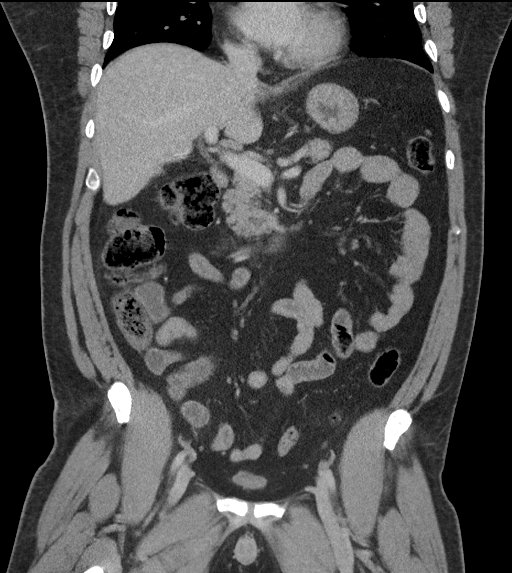
[im 66/118  soft-tissue]
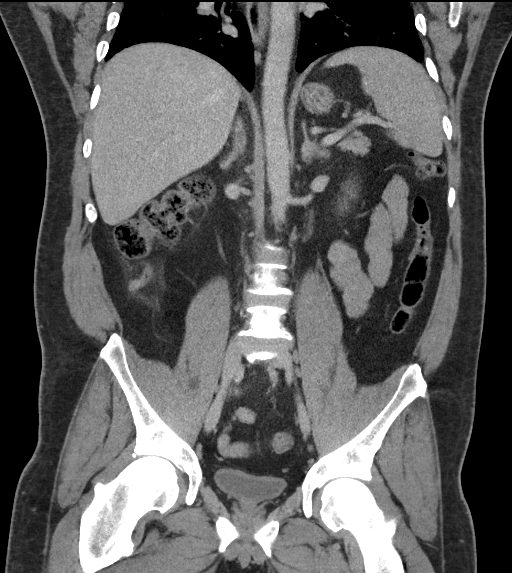

[16 of 46 positions shown; findings below may reference images not displayed]

FINDINGS: Lower chest: Hypoventilatory atelectasis. 5 mm subpleural nodules in
the left lower lobe are unchanged since 3364 and considered benign.
Mild cardiomegaly. Coronary artery stents versus calcifications.

Hepatobiliary: No focal hepatic lesion. Clips in the gallbladder
fossa postcholecystectomy. No biliary dilatation.

Pancreas: No ductal dilatation or inflammation.

Spleen: Normal in size without focal abnormality.

Adrenals/Urinary Tract: Normal adrenal glands. No hydronephrosis or
perinephric edema. Homogeneous renal enhancement with symmetric
excretion on delayed phase imaging. Urinary bladder is nondistended.

Stomach/Bowel: The appendix is dilated measuring 12 mm with
periappendiceal soft tissue stranding, details of below. Stomach is
nondistended. No small or large bowel wall thickening, obstruction
or inflammatory change.

Appendix: Location: Retrocecal

Diameter: 12 mm

Appendicolith: No

Mucosal hyper-enhancement: Faint

Extraluminal gas: No

Periappendiceal collection: No

Vascular/Lymphatic: Prominent periportal nodes are likely reactive.
Mild aorta bi-iliac atherosclerosis. Prominent bilateral external
iliac node measuring 12 mm on the left and 10 mm on the right are
unchanged and likely reactive.

Reproductive: Prostate is unremarkable.

Other: No free air, free fluid, or intra-abdominal fluid collection.
Minimal fat in the left inguinal canal. Tiny fat containing
umbilical hernia.

Musculoskeletal: There are no acute or suspicious osseous
abnormalities.
IMPRESSION: 1. Uncomplicated acute appendicitis.
2. Mild aortic atherosclerosis ([id number]).

## 2019-08-22 ENCOUNTER — Telehealth: Payer: Self-pay | Admitting: "Endocrinology

## 2019-08-22 NOTE — Telephone Encounter (Signed)
Pt said that he has switched insurances, he is with [employer] now. He said that the pharmacy gave him Novolog and Levimer, and last time he received this, his sugar kept going up, it is going up now.

## 2019-08-22 NOTE — Telephone Encounter (Signed)
Called pharmacy and had them fax over the PA request with insurance information.

## 2019-08-23 ENCOUNTER — Other Ambulatory Visit: Payer: Self-pay

## 2019-08-23 MED ORDER — LANTUS SOLOSTAR 100 UNIT/ML ~~LOC~~ SOPN: 90.0000 [IU] | PEN_INJECTOR | Freq: Every day | SUBCUTANEOUS | 2 refills | Status: DC

## 2019-08-23 MED ORDER — HUMALOG KWIKPEN 200 UNIT/ML ~~LOC~~ SOPN: 15.0000 [IU] | PEN_INJECTOR | Freq: Three times a day (TID) | SUBCUTANEOUS | 2 refills | Status: DC

## 2019-08-26 ENCOUNTER — Telehealth: Payer: Self-pay | Admitting: "Endocrinology

## 2019-08-26 NOTE — Telephone Encounter (Signed)
Pt left message that his insurance company denied his insulin

## 2019-08-29 NOTE — Telephone Encounter (Signed)
Notified pt that Humalog has been approved and Lantus is still pending with insurance.

## 2019-09-06 ENCOUNTER — Ambulatory Visit: Payer: 59 | Admitting: "Endocrinology

## 2019-09-16 ENCOUNTER — Ambulatory Visit (INDEPENDENT_AMBULATORY_CARE_PROVIDER_SITE_OTHER): Payer: 59 | Admitting: Family Medicine

## 2019-09-16 ENCOUNTER — Encounter: Payer: Self-pay | Admitting: Cardiology

## 2019-09-16 ENCOUNTER — Other Ambulatory Visit: Payer: Self-pay

## 2019-09-16 VITALS — BP 128/74 | HR 82 | Ht 71.0 in | Wt 293.8 lb

## 2019-09-16 DIAGNOSIS — I255 Ischemic cardiomyopathy: Secondary | ICD-10-CM | POA: Diagnosis not present

## 2019-09-16 DIAGNOSIS — E782 Mixed hyperlipidemia: Secondary | ICD-10-CM

## 2019-09-16 DIAGNOSIS — I10 Essential (primary) hypertension: Secondary | ICD-10-CM | POA: Diagnosis not present

## 2019-09-16 DIAGNOSIS — I471 Supraventricular tachycardia: Secondary | ICD-10-CM

## 2019-09-16 MED ORDER — SPIRONOLACTONE 25 MG PO TABS: 12.5000 mg | ORAL_TABLET | Freq: Every day | ORAL | 1 refills | Status: DC

## 2019-09-16 NOTE — Progress Notes (Addendum)
Cardiology Office Note  Date: 09/16/2019   ID: Lance [name], DOB 29-Dec-1975, MRN [id number]  PCP:  [facility]  Cardiologist:  Rozann Lesches, MD Electrophysiologist:  None   Chief Complaint  Patient presents with  . Follow-up    History of Present Illness: Lance [name] is a 43 y.o. male last seen in June 20, 2019.  He had an anterior STEMI  June of this year with drug-eluting stent to LAD and-staged PTCA of a restenosis  of an obtuse marginal.   He was hospitalized in June 2020 with an episode of symptomatic SVT.  He was started on Cardizem CD 120 mg daily and on low-dose Coreg. He had stopped Cardizem CD due to fatigue and waking up during the night.  At follow-up visit he denied any recurrence of angina or palpitations. He continued on low-dose Coreg, Lipitor. and losartan. His blood pressure at that time was not under optimal control.  He had a recent follow-up echocardiogram which showed an improvement of ejection fraction from 45% in June, to 65 to 70% on recent echo.  He denies any recent progressive anginal or exertional symptoms other than mild dyspnea on moderate exertion. Denies any symptoms similar to symptoms when presenting for MI in June.  Patient states he started smoking again but only smoking 2 to 3 cigarettes/day.  Medications reviewed with patient.  Patient denies taking Aldactone.  Aldactone was ordered in September to replace diltiazem.  He was experiencing side effects from diltiazem.  Aldactone 12.5 mg daily was ordered.  He never pick the prescription up.  We reordered Aldactone today.  Past Medical History:  Diagnosis Date  . CAD (coronary artery disease), native coronary artery    10/18 PCI/DES to mRCA, and OM, with total occlusion of dLCx with collaterals.   . Essential hypertension   . GERD (gastroesophageal reflux disease)   . History of kidney stones   . Hyperlipemia   . SVT (supraventricular tachycardia) ([place]) 2015   Converted with adenosine  . Type 2 diabetes mellitus ([place])     Past Surgical History:  Procedure Laterality Date  . CARDIAC SURGERY    . CORONARY ANGIOPLASTY WITH STENT PLACEMENT  07/15/2017   "2 stents"  . CORONARY/GRAFT ACUTE MI REVASCULARIZATION N/A 03/12/2019   Procedure: Coronary/Graft Acute MI Revascularization;  Surgeon: Nelva Bush, MD;  Location: [facility];  Service: Cardiovascular;  Laterality: N/A;  . LAPAROSCOPIC APPENDECTOMY N/A 11/12/2017   Procedure: APPENDECTOMY LAPAROSCOPIC, REPAIR OF INCARCERATED INCISIONAL HERNIA;  Surgeon: Michael Boston, MD;  Location: [facility];  Service: General;  Laterality: N/A;  . LAPAROSCOPIC CHOLECYSTECTOMY    . LEFT HEART CATH AND CORONARY ANGIOGRAPHY N/A 07/15/2017   Procedure: LEFT HEART CATH AND CORONARY ANGIOGRAPHY;  Surgeon: Sherren Mocha, MD;  Location: [facility];  Service: Cardiovascular;  Laterality: N/A;  . LEFT HEART CATH AND CORONARY ANGIOGRAPHY N/A 03/12/2019   Procedure: LEFT HEART CATH AND CORONARY ANGIOGRAPHY;  Surgeon: Nelva Bush, MD;  Location: [facility];  Service: Cardiovascular;  Laterality: N/A;    Current Outpatient Medications  Medication Sig Dispense Refill  . ALPRAZolam (XANAX) 1 MG tablet Take 1 mg by mouth daily as needed for anxiety.    Marland Kitchen aspirin EC 81 MG tablet Take 1 tablet (81 mg total) by mouth daily with breakfast. 120 tablet 5  . atorvastatin (LIPITOR) 80 MG tablet Take 1 tablet (80 mg total) by mouth at bedtime. 90 tablet 1  . carvedilol (COREG) 3.125  MG tablet Take 1 tablet (3.125 mg total) by mouth 2 (two) times daily with a meal. 180 tablet 1  . insulin aspart (NOVOLOG) 100 UNIT/ML FlexPen Inject 15-21 Units into the skin 3 (three) times daily with meals. Replaces Humalog which is no longer covered 30 mL 2  . Insulin Detemir (LEVEMIR) 100 UNIT/ML Pen Inject 90 Units into the skin at bedtime. 30 mL 2  . Insulin Glargine (LANTUS SOLOSTAR) 100 UNIT/ML Solostar Pen Inject 90 Units  into the skin at bedtime. 30 mL 2  . Insulin Lispro (HUMALOG KWIKPEN) 200 UNIT/ML SOPN Inject 15-21 Units into the skin 3 (three) times daily before meals. 30 mL 2  . Insulin Pen Needle (PEN NEEDLES) 31G X 8 MM MISC 1 each by Does not apply route 4 (four) times daily. 400 each 1  . losartan (COZAAR) 50 MG tablet Take 1 tablet (50 mg total) by mouth daily. 90 tablet 1  . metFORMIN (GLUCOPHAGE-XR) 500 MG 24 hr tablet TAKE 1 TABLET BY MOUTH TWICE DAILY 180 tablet 0  . nitroGLYCERIN (NITROSTAT) 0.4 MG SL tablet Place 1 tablet (0.4 mg total) under the tongue every 5 (five) minutes x 3 doses as needed for chest pain. 25 tablet 1  . Omega-3 1000 MG CAPS Take 1,000 mg by mouth 2 (two) times daily.    Marland Kitchen spironolactone (ALDACTONE) 25 MG tablet Take 0.5 tablets (12.5 mg total) by mouth daily. 45 tablet 1  . ticagrelor (BRILINTA) 90 MG TABS tablet Take 1 tablet (90 mg total) by mouth 2 (two) times daily. 180 tablet 1   No current facility-administered medications for this visit.   Allergies:  Bee venom and Penicillins   Social History: The patient  reports that he quit smoking about 6 months ago. His smoking use included cigarettes. He has a 13.00 pack-year smoking history. He has never used smokeless tobacco. He reports that he does not drink alcohol or use drugs.   Family History: The patient's family history includes Diabetes in his mother; Gout in his mother; Heart attack (age of onset: 53) in his father; Heart disease (age of onset: 54) in his mother.   ROS:  Please see the history of present illness. Otherwise, complete review of systems is positive for none.  All other systems are reviewed and negative.   Physical Exam: VS:  BP 128/74   Pulse 82   Ht  (1.803 m)   Wt 293 lb 12.8 oz (133.3 kg)   SpO2 97%   BMI 40.98 kg/m , BMI Body mass index is 40.98 kg/m.  Wt Readings from Last 3 Encounters:  09/16/19 293 lb 12.8 oz (133.3 kg)  06/20/19 294 lb (133.4 kg)  05/02/19 291 lb 6.4 oz  (132.2 kg)    General: Patient appears comfortable at rest. Neck: Supple, no elevated JVP or carotid bruits, no thyromegaly. Lungs: Clear to auscultation, nonlabored breathing at rest. Cardiac: Regular rate and rhythm, no S3 or significant systolic murmur, no pericardial rub. Abdomen: Soft, nontender, no hepatomegaly, bowel sounds present, no guarding or rebound. Extremities: No pitting edema, distal pulses 2+. Skin: Warm and dry. Neuropsychiatric: Alert and oriented x3, affect grossly appropriate.  ECG:  An ECG dated 04/04/2019 was personally reviewed today and demonstrated:  Junctional tachycardia rate of 136, nonspecific intraventricular conduction delay, anteroseptal infarct, age undetermined, lateral leads are also involved.    Recent Labwork: 04/03/2019: Magnesium 1.9; TSH 3.430 04/04/2019: Hemoglobin 13.8; Platelets 196 04/29/2019: ALT 36; AST 21; BUN 14; Creat 1.09; Potassium 4.2;  Sodium 140     Component Value Date/Time   CHOL 124 03/13/2019 0547   TRIG 234 (H) 03/13/2019 0547   HDL 20 (L) 03/13/2019 0547   CHOLHDL 6.2 03/13/2019 0547   VLDL 47 (H) 03/13/2019 0547   LDLCALC 57 03/13/2019 0547   LDLCALC 63 11/19/2018 0822    Other Studies Reviewed Today: Cardiac catheterization March 12, 2019 Conclusions: 1. Severe two-vessel CAD with 90% stenosis of proximal LAD just beyond D1 with thrombos (some of which had embolized to the apical LAD) and 99% in-stent restenosis of OM1 and 90% stenosis of jailed mid LCx. 2. Mild in-stent restenosis involving the mid RCA. 3. Moderately elevated left ventricular filling pressure with suggestion of anterior hypokinesis on the left ventriculogram (suboptimal opacification with hand-injection). 4. Successful PCI to proximal LAD using Synergy 3.0 x 2 mm DES (post-dilated proximally to 4.0 mm) with 0% residual stenosis and TIMI-3 flow. 5. Success balloon angioplasty to LCx/OM1 stent using [place] Euphora 2.25 x 8 mm and Sapphire 2.5 x 12 mm balloons  with reduction in stenosis of OM1 from 99% to 40% and mid LCx from 90% to 60% and restoration of TIMI-3 flow.  Given improving chest pain, relatively small vessel size with need for complex bifurcation intervention, and radiation/contrast doses administered to that point, decision was made to forego additional intervention to this vessel.  Recommendations: 1. Continue tirofiban infusion x 18 hours. 2. Dual antiplatelet therapy with aspirin and ticagrelor for at least 12 months, ideally longer. 3. Aggressive secondary prevention. 4. Obtain transthoracic echocardiogram to assess LV function. 5. If patient has refractory chest pain, complex bifurcation intervention to previously stented LCx/OM1 and AV groove LCx could be considered, though I worry about restenosis in the future given aggressive restenosis since prior PCI in 2018, relatively small vessel size, and likely need for culotte stenting of the LCx.   Repeat echo July 26, 2019  1. Left ventricular ejection fraction, by visual estimation, is 65 to 70%. The left ventricle has hyperdynamic function. There is mildly increased left ventricular posterior wall hypertrophy. 2. Elevated left ventricular end-diastolic pressure. 3. Left ventricular diastolic Doppler parameters are consistent with impaired relaxation pattern of LV diastolic filling. 4. Global right ventricle has normal systolic function.The right ventricular size is normal. Right vetricular wall thickness was not assessed. 5. Left atrial size was normal. 6. Right atrial size was normal. 7. The mitral valve is grossly normal. Mild mitral valve regurgitation. 8. The tricuspid valve is grossly normal. Tricuspid valve regurgitation is trivial. 9. The aortic valve is tricuspid Aortic valve regurgitation was not visualized by color flow Doppler. Mild aortic valve sclerosis without stenosis. 10. The pulmonic valve was grossly normal. Pulmonic valve regurgitation is not visualized  by color flow Doppler. 11. Normal pulmonary artery systolic pressure. 12. The inferior vena cava IVC is small suggestive of low RAP. In comparison to the previous echocardiogram(s): Echocardiogram done 03/13/19 showed an EF of 45%.  Assessment and Plan:  1. SVT (supraventricular tachycardia) (HCC)   2. Ischemic cardiomyopathy   3. Essential hypertension, benign   4. Mixed hyperlipidemia     1. SVT (supraventricular tachycardia) (H[facility]) Patient denies any  episodes of palpitations or racing heart.  Patient discontinued his diltiazem due to side effects.  Heart rate today 82, pulse is regular.  2. Ischemic cardiomyopathy  Left ventricular ejection fraction, by visual estimation, is 65 to 70% on recent echocardiogram.  Previous ejection fraction was 45% in June after MI.  Patient states he was never  notified that he was to start Aldactone.  We reordered Aldactone 12.5 mg daily from [facility] today.  Continue aspirin 81 mg, Lipitor 80 mg, carvedilol 3.125 mg twice daily, Brilinta 90 mg twice daily, spironolactone 12.5 mg daily  3. Essential hypertension, benign Blood pressure within normal limits today.  Continue losartan 50 mg daily.    4. Mixed hyperlipidemia Continue Lipitor 80 mg daily at bedtime.  Most recent LDL 47, HDL 20, triglycerides 234 in June 2020.   Medication Adjustments/Labs and Tests Ordered: Current medicines are reviewed at length with the patient today.  Concerns regarding medicines are outlined above.    Patient Instructions  Your physician wants you to follow-up in: 6 MONTHS WITH DR MCDOWELL You will receive a reminder letter in the mail two months in advance. If you don't receive a letter, please call our office to schedule the follow-up appointment.  Your physician recommends that you continue on your current medications as directed. Please refer to the Current Medication list given to you today.  Thank you for choosing [facility]!!            Signed, Rennis Harding, NP 09/16/2019 5:27 PM    [facility] [address] [place], [place], [place] [address] Phone: [phone]; Fax: [phone]

## 2019-09-16 NOTE — Patient Instructions (Signed)

## 2019-10-11 DIAGNOSIS — E1165 Type 2 diabetes mellitus with hyperglycemia: Secondary | ICD-10-CM | POA: Diagnosis not present

## 2019-10-11 DIAGNOSIS — E559 Vitamin D deficiency, unspecified: Secondary | ICD-10-CM | POA: Diagnosis not present

## 2019-10-12 LAB — COMPLETE METABOLIC PANEL WITH GFR
AG Ratio: 1.8 (calc) (ref 1.0–2.5)
ALT: 46 U/L (ref 9–46)
AST: 29 U/L (ref 10–40)
Albumin: 4.1 g/dL (ref 3.6–5.1)
Alkaline phosphatase (APISO): 87 U/L (ref 36–130)
BUN: 13 mg/dL (ref 7–25)
CO2: 32 mmol/L (ref 20–32)
Calcium: 9.2 mg/dL (ref 8.6–10.3)
Chloride: 101 mmol/L (ref 98–110)
Creat: 0.99 mg/dL (ref 0.60–1.35)
GFR, Est African American: 108 mL/min/{1.73_m2} (ref >=60)
GFR, Est Non African American: 93 mL/min/{1.73_m2} (ref >=60)
Globulin: 2.3 g/dL (calc) (ref 1.9–3.7)
Glucose, Bld: 131 mg/dL — ABNORMAL HIGH (ref 65–99)
Potassium: 4.3 mmol/L (ref 3.5–5.3)
Sodium: 139 mmol/L (ref 135–146)
Total Bilirubin: 0.9 mg/dL (ref 0.2–1.2)
Total Protein: 6.4 g/dL (ref 6.1–8.1)

## 2019-10-12 LAB — MICROALBUMIN / CREATININE URINE RATIO
Creatinine, Urine: 184 mg/dL (ref 20–320)
Microalb Creat Ratio: 2 mcg/mg creat (ref <30)
Microalb, Ur: 0.4 mg/dL

## 2019-10-12 LAB — LIPID PANEL
Cholesterol: 109 mg/dL (ref <200)
HDL: 20 mg/dL — ABNORMAL LOW (ref >=40)
LDL Cholesterol (Calc): 63 mg/dL (calc)
Non-HDL Cholesterol (Calc): 89 mg/dL (calc) (ref <130)
Total CHOL/HDL Ratio: 5.5 (calc) — ABNORMAL HIGH (ref <5.0)
Triglycerides: 185 mg/dL — ABNORMAL HIGH (ref <150)

## 2019-10-12 LAB — HEMOGLOBIN A1C
Hgb A1c MFr Bld: 7.8 % of total Hgb — ABNORMAL HIGH (ref <5.7)
Mean Plasma Glucose: 177 (calc)
eAG (mmol/L): 9.8 (calc)

## 2019-10-12 LAB — VITAMIN D 25 HYDROXY (VIT D DEFICIENCY, FRACTURES): Vit D, 25-Hydroxy: 21 ng/mL — ABNORMAL LOW (ref 30–100)

## 2019-10-13 ENCOUNTER — Encounter: Payer: Self-pay | Admitting: "Endocrinology

## 2019-10-13 ENCOUNTER — Ambulatory Visit (INDEPENDENT_AMBULATORY_CARE_PROVIDER_SITE_OTHER): Payer: 59 | Admitting: "Endocrinology

## 2019-10-13 DIAGNOSIS — E1165 Type 2 diabetes mellitus with hyperglycemia: Secondary | ICD-10-CM

## 2019-10-13 DIAGNOSIS — I10 Essential (primary) hypertension: Secondary | ICD-10-CM | POA: Diagnosis not present

## 2019-10-13 DIAGNOSIS — E782 Mixed hyperlipidemia: Secondary | ICD-10-CM

## 2019-10-13 NOTE — Progress Notes (Signed)
10/13/2019                                                     Endocrinology Telehealth Visit Follow up Note -During COVID -19 Pandemic  This visit type was conducted due to national recommendations for restrictions regarding the COVID-19 Pandemic  in an effort to limit this patient's exposure and mitigate transmission of the corona virus.  Due to his co-morbid illnesses, Lance DELAUGHTER is at  moderate to high risk for complications without adequate follow up.  This format is felt to be most appropriate for him at this time.  I connected with this patient on 10/13/2019   by telephone and verified that I am speaking with the correct person using two identifiers. Lance [name], [birth year]. he has verbally consented to this visit. All issues noted in this document were discussed and addressed. The format was not optimal for physical exam.    Subjective:    Patient ID: Lance [name], male   DOB: [birth year]. Patient is being engaged in telehealth via telephone in follow-up for management of currently uncontrolled symptomatic type 2 diabetes complicated by coronary artery disease recently which required stent placement, hyperlipidemia, hypertension.   Past Medical History:  Diagnosis Date  . CAD (coronary artery disease), native coronary artery    10/18 PCI/DES to mRCA, and OM, with total occlusion of dLCx with collaterals.   . Essential hypertension   . GERD (gastroesophageal reflux disease)   . History of kidney stones   . Hyperlipemia   . SVT (supraventricular tachycardia) (HCC) 2015   Converted with adenosine  . Type 2 diabetes mellitus (HCC)    Past Surgical History:  Procedure Laterality Date  . CARDIAC SURGERY    . CORONARY ANGIOPLASTY WITH STENT PLACEMENT  07/15/2017   "2 stents"  . CORONARY/GRAFT ACUTE MI REVASCULARIZATION N/A 03/12/2019   Procedure: Coronary/Graft Acute MI Revascularization;  Surgeon: Yvonne Kendall, MD;  Location: [facility] INVASIVE CV [facility];  Service: Cardiovascular;   Laterality: N/A;  . LAPAROSCOPIC APPENDECTOMY N/A 11/12/2017   Procedure: APPENDECTOMY LAPAROSCOPIC, REPAIR OF INCARCERATED INCISIONAL HERNIA;  Surgeon: Karie Soda, MD;  Location: [facility];  Service: General;  Laterality: N/A;  . LAPAROSCOPIC CHOLECYSTECTOMY    . LEFT HEART CATH AND CORONARY ANGIOGRAPHY N/A 07/15/2017   Procedure: LEFT HEART CATH AND CORONARY ANGIOGRAPHY;  Surgeon: Tonny Bollman, MD;  Location: [facility] INVASIVE CV LAB;  Service: Cardiovascular;  Laterality: N/A;  . LEFT HEART CATH AND CORONARY ANGIOGRAPHY N/A 03/12/2019   Procedure: LEFT HEART CATH AND CORONARY ANGIOGRAPHY;  Surgeon: Yvonne Kendall, MD;  Location: [facility] INVASIVE CV [facility];  Service: Cardiovascular;  Laterality: N/A;   Social History   Socioeconomic History  . Marital status: Married    Spouse name: Not on file  . Number of children: Not on file  . Years of education: Not on file  . Highest education level: Not on file  Occupational History  . Occupation: Conservation officer, nature: [employer]  Tobacco Use  . Smoking status: Former Smoker    Packs/day: 0.50    Years: 26.00    Pack years: 13.00    Types: Cigarettes    Quit date: 03/07/2019    Years since quitting: 0.6  . Smokeless tobacco: Never Used  Substance and Sexual Activity  . Alcohol use: No  .  Drug use: No  . Sexual activity: Yes    Birth control/protection: None  Other Topics Concern  . Not on file  Social History Narrative  . Not on file   Social Determinants of Health   Financial Resource Strain:   . Difficulty of Paying Living Expenses: Not on file  Food Insecurity:   . Worried About Programme researcher, broadcasting/film/videounning Out of Food in the Last Year: Not on file  . Ran Out of Food in the Last Year: Not on file  Transportation Needs:   . Lack of Transportation (Medical): Not on file  . Lack of Transportation (Non-Medical): Not on file  Physical Activity:   . Days of Exercise per Week: Not on file  . Minutes of Exercise per Session: Not on file  Stress:    . Feeling of Stress : Not on file  Social Connections:   . Frequency of Communication with Friends and Family: Not on file  . Frequency of Social Gatherings with Friends and Family: Not on file  . Attends Religious Services: Not on file  . Active Member of Clubs or Organizations: Not on file  . Attends BankerClub or Organization Meetings: Not on file  . Marital Status: Not on file   Outpatient Encounter Medications as of 10/13/2019  Medication Sig  . ALPRAZolam (XANAX) 1 MG tablet Take 1 mg by mouth daily as needed for anxiety.  Marland Kitchen. aspirin EC 81 MG tablet Take 1 tablet (81 mg total) by mouth daily with breakfast.  . atorvastatin (LIPITOR) 80 MG tablet Take 1 tablet (80 mg total) by mouth at bedtime.  . carvedilol (COREG) 3.125 MG tablet Take 1 tablet (3.125 mg total) by mouth 2 (two) times daily with a meal.  . Insulin Glargine (LANTUS SOLOSTAR) 100 UNIT/ML Solostar Pen Inject 90 Units into the skin at bedtime.  . Insulin Lispro (HUMALOG KWIKPEN) 200 UNIT/ML SOPN Inject 15-21 Units into the skin 3 (three) times daily before meals.  . Insulin Pen Needle (PEN NEEDLES) 31G X 8 MM MISC 1 each by Does not apply route 4 (four) times daily.  Marland Kitchen. losartan (COZAAR) 50 MG tablet Take 1 tablet (50 mg total) by mouth daily.  . metFORMIN (GLUCOPHAGE-XR) 500 MG 24 hr tablet TAKE 1 TABLET BY MOUTH TWICE DAILY  . nitroGLYCERIN (NITROSTAT) 0.4 MG SL tablet Place 1 tablet (0.4 mg total) under the tongue every 5 (five) minutes x 3 doses as needed for chest pain.  . Omega-3 1000 MG CAPS Take 1,000 mg by mouth 2 (two) times daily.  Marland Kitchen. spironolactone (ALDACTONE) 25 MG tablet Take 0.5 tablets (12.5 mg total) by mouth daily.  . ticagrelor (BRILINTA) 90 MG TABS tablet Take 1 tablet (90 mg total) by mouth 2 (two) times daily.  . [DISCONTINUED] insulin aspart (NOVOLOG) 100 UNIT/ML FlexPen Inject 15-21 Units into the skin 3 (three) times daily with meals. Replaces Humalog which is no longer covered  . [DISCONTINUED] Insulin  Detemir (LEVEMIR) 100 UNIT/ML Pen Inject 90 Units into the skin at bedtime.   No facility-administered encounter medications on file as of 10/13/2019.   ALLERGIES: Allergies  Allergen Reactions  . Bee Venom Anaphylaxis and Swelling  . Penicillins Hives, Itching and Rash    Has patient had a PCN reaction causing immediate rash, facial/tongue/throat swelling, SOB or lightheadedness with hypotension: No Has patient had a PCN reaction causing severe rash involving mucus membranes or skin necrosis: No Has patient had a PCN reaction that required hospitalization: No Has patient had a PCN reaction occurring  within the last 10 years: No If all of the above answers are "NO", then may proceed with Cephalosporin use.    VACCINATION STATUS:  There is no immunization history on file for this patient.  Diabetes He presents for his follow-up diabetic visit. He has type 2 diabetes mellitus. Onset time: He was diagnosed at approximate age of 51 years. His disease course has been worsening (Was recently hospitalized due to appendicitis after 2 months of antibiotic treatment.  He is status post appendectomy.  ). There are no hypoglycemic associated symptoms. Pertinent negatives for hypoglycemia include no confusion, headaches, pallor or seizures. Pertinent negatives for diabetes include no blurred vision, no chest pain, no fatigue, no polydipsia, no polyphagia, no polyuria and no weakness. There are no hypoglycemic complications. Symptoms are improving. There are no diabetic complications. Risk factors for coronary artery disease include diabetes mellitus, dyslipidemia, family history, hypertension, male sex, obesity, tobacco exposure and sedentary lifestyle. Current diabetic treatment includes intensive insulin program (He is on Levemir 68 units BID and Humalog 10 units TIDAC.). He is following a generally unhealthy diet. When asked about meal planning, he reported none. He has not had a previous visit with a  dietitian. He rarely participates in exercise. His home blood glucose trend is fluctuating minimally. (He reports that his blood glucose readings range from 100 - 175, no hypoglycemic events.  Previous labs show A1c of 7.8%, slightly increasing from 7.4%.) An ACE inhibitor/angiotensin II receptor blocker is being taken. He does not see a podiatrist.Eye exam is not current.  Hyperlipidemia This is a chronic problem. The current episode started more than 1 year ago. The problem is controlled. Exacerbating diseases include diabetes and obesity. Pertinent negatives include no chest pain, myalgias or shortness of breath. Current antihyperlipidemic treatment includes statins and bile acid squestrants. The current treatment provides significant improvement of lipids. Risk factors for coronary artery disease include diabetes mellitus, dyslipidemia, hypertension, male sex, obesity and a sedentary lifestyle.  Hypertension This is a chronic problem. The current episode started more than 1 year ago. Pertinent negatives include no blurred vision, chest pain, headaches, neck pain, palpitations or shortness of breath. Risk factors for coronary artery disease include dyslipidemia, diabetes mellitus, male gender, smoking/tobacco exposure and sedentary lifestyle. Past treatments include ACE inhibitors.    Review of systems: Limited as above.  Objective:    There were no vitals taken for this visit.  Wt Readings from Last 3 Encounters:  09/16/19 293 lb 12.8 oz (133.3 kg)  06/20/19 294 lb (133.4 kg)  05/02/19 291 lb 6.4 oz (132.2 kg)    CMP ( most recent) CMP     Component Value Date/Time   NA 139 10/11/2019 0856   NA 141 07/07/2017 0837   K 4.3 10/11/2019 0856   CL 101 10/11/2019 0856   CO2 32 10/11/2019 0856   GLUCOSE 131 (H) 10/11/2019 0856   BUN 13 10/11/2019 0856   BUN 15 07/07/2017 0837   CREATININE 0.99 10/11/2019 0856   CALCIUM 9.2 10/11/2019 0856   PROT 6.4 10/11/2019 0856   ALBUMIN 3.7  04/04/2019 0208   AST 29 10/11/2019 0856   ALT 46 10/11/2019 0856   ALKPHOS 90 04/04/2019 0208   BILITOT 0.9 10/11/2019 0856   GFRNONAA 93 10/11/2019 0856   GFRAA 108 10/11/2019 0856     Diabetic Labs (most recent): Lab Results  Component Value Date   HGBA1C 7.8 (H) 10/11/2019   HGBA1C 7.4 (H) 04/29/2019   HGBA1C 7.7 (H) 03/13/2019  Lipid Panel     Component Value Date/Time   CHOL 109 10/11/2019 0856   TRIG 185 (H) 10/11/2019 0856   HDL 20 (L) 10/11/2019 0856   CHOLHDL 5.5 (H) 10/11/2019 0856   VLDL 47 (H) 03/13/2019 0547   LDLCALC 63 10/11/2019 0856     Assessment & Plan:   1. Uncontrolled type 2 diabetes mellitus with chronic kidney disease, coronary artery disease, with long-term current use of insulin.  - Patient has currently uncontrolled symptomatic type 2 DM since  44 years of age. Hallam reports stable and near target glycemic profile, and previsit A1c of 7.8%.  He did have some interruption of his insulin treatment during insurance coverage labs.    He recently got his supplies for Lantus and Humalog.     Recent labs reviewed. -his diabetes is complicated by obesity/sedentary life, chronic heavy smoking and JARIOUS LYON remains at a high risk for more acute and chronic complications which include CAD, CVA, CKD, retinopathy, and neuropathy. These are all discussed in detail with the patient.  - I have counseled him on diet management and weight loss, by adopting a carbohydrate restricted/protein rich diet.  - he  admits there is a room for improvement in his diet and drink choices. -  Suggestion is made for him to avoid simple carbohydrates  from his diet including Cakes, Sweet Desserts / Pastries, Ice Cream, Soda (diet and regular), Sweet Tea, Candies, Chips, Cookies, Sweet Pastries,  Store Bought Juices, Alcohol in Excess of  1-2 drinks a day, Artificial Sweeteners, Coffee Creamer, and "Sugar-free" Products. This will help patient to have stable blood  glucose profile and potentially avoid unintended weight gain.  - I encouraged him to switch to  unprocessed or minimally processed complex starch and increased protein intake (animal or plant source), fruits, and vegetables.  - he is advised to stick to a routine mealtimes to eat 3 meals  a day and avoid unnecessary snacks ( to snack only to correct hypoglycemia).   - I have approached him with the following individualized plan to manage diabetes and patient agrees:   -He will continue to require intensive treatment with basal/bolus insulin in order for him to achieve and maintain control of diabetes to target.   -He is advised to continue Lantus 90 units nightly, continue Humalog 15-21 units 3 times daily before meals, associated with strict monitoring of blood glucose 4 times a day-before meals and at bedtime .  -Patient is encouraged to call clinic for blood glucose levels less than 70 or above 200 mg /dl. -He did not afford the co-pays for  continuous glucose monitoring.  -He is advised to continue metformin 500 mg ER twice a day after breakfast and supper.    -He declined offer for weekly incretin injection therapy.  - Patient specific target  A1c;  LDL, HDL, Triglycerides, and  Waist Circumference were discussed in detail.  2) BP/HTN: he is advised to home monitor blood pressure and report if > 140/90 on 2 separate readings.   He is advised to continue  with his current blood pressure medications including lisinopril 40 mg p.o. daily along with his carvedilol 3.125 mg p.o. twice daily.    3) Lipids: His recent lipid panel showed controlled LDL of 63.  He has benefited from statin therapy.  He is advised to continue  omega-3 fatty acids 1000 units p.o. twice daily, and Lipitor 80 mg p.o. nightly.     4)  Weight/Diet: CDE Consult has  been initiated  , exercise, and detailed carbohydrates information provided.  He is an ideal candidate for bariatric surgery.  He declined bariatric surgery  as an option for weight control.    5) Chronic Care/Health Maintenance:  -he  is on ACEI/ARB and Statin medications and  is encouraged to continue to follow up with Ophthalmology, Dentist,  Podiatrist at least yearly or according to recommendations, and advised to  quit smoking. I have recommended yearly flu vaccine and pneumonia vaccination at least every 5 years; moderate intensity exercise for up to 150 minutes weekly; and  sleep for at least 7 hours a day.  - I advised patient to maintain close follow up with Practice, [facility] for primary care needs.  - Time spent on this patient care encounter:  35 min, of which >50% was spent in  counseling and the rest reviewing his  current and  previous labs/studies ( including abstraction from other facilities),  previous treatments, his blood glucose readings, and medications' doses and developing a plan for long-term care based on the latest recommendations for standards of care; and documenting his care.  Lance [name] participated in the discussions, expressed understanding, and voiced agreement with the above plans.  All questions were answered to his satisfaction. he is encouraged to contact clinic should he have any questions or concerns prior to his return visit.   Follow up plan: - Return in about 3 months (around 01/11/2020) for Bring Meter and Logs- A1c in Office.  Marquis Lunch, MD Phone: [phone]  Fax: [phone]  -  This note was partially dictated with voice recognition software. Similar sounding words can be transcribed inadequately or may not  be corrected upon review.  10/13/2019, 4:17 PM

## 2019-11-09 ENCOUNTER — Other Ambulatory Visit: Payer: Self-pay

## 2019-11-09 MED ORDER — LANTUS SOLOSTAR 100 UNIT/ML ~~LOC~~ SOPN: 90.0000 [IU] | PEN_INJECTOR | Freq: Every day | SUBCUTANEOUS | 0 refills | Status: DC

## 2019-12-02 ENCOUNTER — Telehealth: Payer: Self-pay

## 2019-12-02 NOTE — Telephone Encounter (Signed)
12-02-19 Pt would like to know the medication used during his Stress test  Please call [phone]  Thanks renee

## 2019-12-02 NOTE — Telephone Encounter (Signed)
Patient was concerned about the name of the medication use and stress testing, interactions, and side effects.  I explained these to him in detail.  He was grateful for the information.  Theodore Demark, PA-C 12/02/2019 4:24 PM

## 2019-12-02 NOTE — Telephone Encounter (Signed)
Rhonda Barrett PA-C, has agreed to speak with patient

## 2019-12-20 ENCOUNTER — Other Ambulatory Visit: Payer: Self-pay | Admitting: "Endocrinology

## 2020-01-10 ENCOUNTER — Telehealth: Payer: Self-pay | Admitting: "Endocrinology

## 2020-01-10 MED ORDER — METFORMIN HCL ER 500 MG PO TB24: 500.0000 mg | ORAL_TABLET | Freq: Two times a day (BID) | ORAL | 0 refills | Status: DC

## 2020-01-10 NOTE — Telephone Encounter (Signed)
Rx refill sent to [facility]. 

## 2020-01-10 NOTE — Telephone Encounter (Signed)
Patient needs refill on metFORMIN (GLUCOPHAGE-XR) 500 MG 24 hr tablet. [facility].

## 2020-01-11 ENCOUNTER — Ambulatory Visit: Payer: 59 | Admitting: "Endocrinology

## 2020-01-30 ENCOUNTER — Ambulatory Visit (INDEPENDENT_AMBULATORY_CARE_PROVIDER_SITE_OTHER): Payer: 59 | Admitting: "Endocrinology

## 2020-01-30 ENCOUNTER — Other Ambulatory Visit: Payer: Self-pay

## 2020-01-30 ENCOUNTER — Encounter: Payer: Self-pay | Admitting: "Endocrinology

## 2020-01-30 VITALS — BP 159/85 | HR 84 | Ht 71.0 in | Wt 294.8 lb

## 2020-01-30 DIAGNOSIS — E782 Mixed hyperlipidemia: Secondary | ICD-10-CM

## 2020-01-30 DIAGNOSIS — E1165 Type 2 diabetes mellitus with hyperglycemia: Secondary | ICD-10-CM | POA: Diagnosis not present

## 2020-01-30 DIAGNOSIS — I10 Essential (primary) hypertension: Secondary | ICD-10-CM

## 2020-01-30 LAB — POCT GLYCOSYLATED HEMOGLOBIN (HGB A1C): Hemoglobin A1C: 8.4 % — AB (ref 4.0–5.6)

## 2020-01-30 MED ORDER — FREESTYLE LIBRE 14 DAY READER DEVI: 1.0000 | Freq: Once | 0 refills | Status: AC

## 2020-01-30 MED ORDER — LANTUS SOLOSTAR 100 UNIT/ML ~~LOC~~ SOPN: 80.0000 [IU] | PEN_INJECTOR | Freq: Every day | SUBCUTANEOUS | 2 refills | Status: DC

## 2020-01-30 MED ORDER — HUMALOG KWIKPEN 200 UNIT/ML ~~LOC~~ SOPN: 10.0000 [IU] | PEN_INJECTOR | Freq: Three times a day (TID) | SUBCUTANEOUS | 2 refills | Status: DC

## 2020-01-30 MED ORDER — FREESTYLE LIBRE 14 DAY SENSOR MISC: 1.0000 | 2 refills | Status: DC

## 2020-01-30 NOTE — Patient Instructions (Signed)

## 2020-01-30 NOTE — Progress Notes (Signed)
01/30/2020    Endocrinology follow-up note    Subjective:    Patient ID: Lance [name], male   DOB: 03/13/1976.  Is being seen in follow-up for management of currently uncontrolled type 2 diabetes, complicated by coronary artery disease recently which required stent placement, hyperlipidemia, hypertension.   Past Medical History:  Diagnosis Date  . CAD (coronary artery disease), native coronary artery    10/18 PCI/DES to mRCA, and OM, with total occlusion of dLCx with collaterals.   . Essential hypertension   . GERD (gastroesophageal reflux disease)   . History of kidney stones   . Hyperlipemia   . SVT (supraventricular tachycardia) (HCC) 2015   Converted with adenosine  . Type 2 diabetes mellitus (HCC)    Past Surgical History:  Procedure Laterality Date  . CARDIAC SURGERY    . CORONARY ANGIOPLASTY WITH STENT PLACEMENT  07/15/2017   "2 stents"  . CORONARY/GRAFT ACUTE MI REVASCULARIZATION N/A 03/12/2019   Procedure: Coronary/Graft Acute MI Revascularization;  Surgeon: Yvonne Kendall, MD;  Location: [facility]IVE [facility];  Service: Cardiovascular;  Laterality: N/A;  . LAPAROSCOPIC APPENDECTOMY N/A 11/12/2017   Procedure: APPENDECTOMY LAPAROSCOPIC, REPAIR OF INCARCERATED INCISIONAL HERNIA;  Surgeon: Karie Soda, MD;  Location: [facility];  Service: General;  Laterality: N/A;  . LAPAROSCOPIC CHOLECYSTECTOMY    . LEFT HEART CATH AND CORONARY ANGIOGRAPHY N/A 07/15/2017   Procedure: LEFT HEART CATH AND CORONARY ANGIOGRAPHY;  Surgeon: Tonny Bollman, MD;  Location: [facility] CV LAB;  Service: Cardiovascular;  Laterality: N/A;  . LEFT HEART CATH AND CORONARY ANGIOGRAPHY N/A 03/12/2019   Procedure: LEFT HEART CATH AND CORONARY ANGIOGRAPHY;  Surgeon: Yvonne Kendall, MD;  Location: [facility];  Service: Cardiovascular;  Laterality: N/A;   Social History   Socioeconomic History  . Marital status: Married    Spouse name: Not on file  . Number of children: Not on file  . Years of  education: Not on file  . Highest education level: Not on file  Occupational History  . Occupation: Conservation officer, nature: [employer]  Tobacco Use  . Smoking status: Former Smoker    Packs/day: 0.50    Years: 26.00    Pack years: 13.00    Types: Cigarettes    Quit date: 03/07/2019    Years since quitting: 0.9  . Smokeless tobacco: Never Used  Substance and Sexual Activity  . Alcohol use: No  . Drug use: No  . Sexual activity: Yes    Birth control/protection: None  Other Topics Concern  . Not on file  Social History Narrative  . Not on file   Social Determinants of Health   Financial Resource Strain:   . Difficulty of Paying Living Expenses:   Food Insecurity:   . Worried About Programme researcher, broadcasting/film/video in the Last Year:   . Barista in the Last Year:   Transportation Needs:   . Freight forwarder (Medical):   Marland Kitchen Lack of Transportation (Non-Medical):   Physical Activity:   . Days of Exercise per Week:   . Minutes of Exercise per Session:   Stress:   . Feeling of Stress :   Social Connections:   . Frequency of Communication with Friends and Family:   . Frequency of Social Gatherings with Friends and Family:   . Attends Religious Services:   . Active Member of Clubs or Organizations:   . Attends Banker Meetings:   Marland Kitchen Marital Status:  Outpatient Encounter Medications as of 01/30/2020  Medication Sig  . ALPRAZolam (XANAX) 1 MG tablet Take 1 mg by mouth daily as needed for anxiety.  Marland Kitchen. aspirin EC 81 MG tablet Take 1 tablet (81 mg total) by mouth daily with breakfast.  . atorvastatin (LIPITOR) 80 MG tablet Take 1 tablet (80 mg total) by mouth at bedtime.  . carvedilol (COREG) 3.125 MG tablet Take 1 tablet (3.125 mg total) by mouth 2 (two) times daily with a meal.  . Continuous Blood Gluc Receiver (FREESTYLE LIBRE 14 DAY READER) DEVI 1 each by Does not apply route once for 1 dose.  . Continuous Blood Gluc Sensor (FREESTYLE LIBRE 14 DAY SENSOR)  MISC Inject 1 each into the skin every 14 (fourteen) days. Use as directed.  . insulin glargine (LANTUS SOLOSTAR) 100 UNIT/ML Solostar Pen Inject 80 Units into the skin at bedtime.  . insulin lispro (HUMALOG KWIKPEN) 200 UNIT/ML KwikPen Inject 10-16 Units into the skin 3 (three) times daily before meals.  . Insulin Pen Needle (PEN NEEDLES) 31G X 8 MM MISC 1 each by Does not apply route 4 (four) times daily.  Marland Kitchen. losartan (COZAAR) 50 MG tablet Take 1 tablet (50 mg total) by mouth daily.  . metFORMIN (GLUCOPHAGE-XR) 500 MG 24 hr tablet Take 1 tablet (500 mg total) by mouth 2 (two) times daily.  . nitroGLYCERIN (NITROSTAT) 0.4 MG SL tablet Place 1 tablet (0.4 mg total) under the tongue every 5 (five) minutes x 3 doses as needed for chest pain.  . Omega-3 1000 MG CAPS Take 1,000 mg by mouth 2 (two) times daily.  . ticagrelor (BRILINTA) 90 MG TABS tablet Take 1 tablet (90 mg total) by mouth 2 (two) times daily.  . [DISCONTINUED] Insulin Lispro (HUMALOG KWIKPEN) 200 UNIT/ML SOPN Inject 15-21 Units into the skin 3 (three) times daily before meals.  . [DISCONTINUED] LANTUS SOLOSTAR 100 UNIT/ML Solostar Pen INJECT 90 UNITS INTO THE SKIN AT BEDTIME.  . [DISCONTINUED] spironolactone (ALDACTONE) 25 MG tablet Take 0.5 tablets (12.5 mg total) by mouth daily.   No facility-administered encounter medications on file as of 01/30/2020.   ALLERGIES: Allergies  Allergen Reactions  . Bee Venom Anaphylaxis and Swelling  . Penicillins Hives, Itching and Rash    Has patient had a PCN reaction causing immediate rash, facial/tongue/throat swelling, SOB or lightheadedness with hypotension: No Has patient had a PCN reaction causing severe rash involving mucus membranes or skin necrosis: No Has patient had a PCN reaction that required hospitalization: No Has patient had a PCN reaction occurring within the last 10 years: No If all of the above answers are "NO", then may proceed with Cephalosporin use.    VACCINATION  STATUS:  There is no immunization history on file for this patient.  Diabetes He presents for his follow-up diabetic visit. He has type 2 diabetes mellitus. Onset time: He was diagnosed at approximate age of 35 years. His disease course has been worsening (Was recently hospitalized due to appendicitis after 2 months of antibiotic treatment.  He is status post appendectomy.  ). There are no hypoglycemic associated symptoms. Pertinent negatives for hypoglycemia include no confusion, headaches, pallor or seizures. Pertinent negatives for diabetes include no blurred vision, no chest pain, no fatigue, no polydipsia, no polyphagia, no polyuria and no weakness. There are no hypoglycemic complications. Symptoms are worsening. There are no diabetic complications. Risk factors for coronary artery disease include diabetes mellitus, dyslipidemia, family history, hypertension, male sex, obesity, tobacco exposure and sedentary lifestyle. Current diabetic  treatment includes intensive insulin program (He is on Levemir 68 units BID and Humalog 10 units TIDAC.). His weight is fluctuating minimally. He is following a generally unhealthy diet. When asked about meal planning, he reported none. He has not had a previous visit with a dietitian. He rarely participates in exercise. His home blood glucose trend is increasing steadily. His overall blood glucose range is 140-180 mg/dl. (He presents with NovoLog.  His meter shows 26 readings in the last 30 days-averaging 166.  He was supposed to monitor 4 times a day.  His point-of-care A1c is 8.4%, increasing from 7.8%.) An ACE inhibitor/angiotensin II receptor blocker is being taken. He does not see a podiatrist.Eye exam is not current.  Hyperlipidemia This is a chronic problem. The current episode started more than 1 year ago. The problem is controlled. Exacerbating diseases include diabetes and obesity. Pertinent negatives include no chest pain, myalgias or shortness of breath.  Current antihyperlipidemic treatment includes statins and bile acid squestrants. The current treatment provides significant improvement of lipids. Risk factors for coronary artery disease include diabetes mellitus, dyslipidemia, hypertension, male sex, obesity and a sedentary lifestyle.  Hypertension This is a chronic problem. The current episode started more than 1 year ago. Pertinent negatives include no blurred vision, chest pain, headaches, neck pain, palpitations or shortness of breath. Risk factors for coronary artery disease include dyslipidemia, diabetes mellitus, male gender, smoking/tobacco exposure and sedentary lifestyle. Past treatments include ACE inhibitors.     Review of systems  Constitutional: + Minimally fluctuating body weight,  current  Body mass index is 41.12 kg/m. , no fatigue, no subjective hyperthermia, no subjective hypothermia Eyes: no blurry vision, no xerophthalmia ENT: no sore throat, no nodules palpated in throat, no dysphagia/odynophagia, no hoarseness Cardiovascular: no Chest Pain, no Shortness of Breath, no palpitations, no leg swelling Respiratory: no cough, no shortness of breath Gastrointestinal: no Nausea/Vomiting/Diarhhea Musculoskeletal: no muscle/joint aches Skin: no rashes, no hyperemia Neurological: no tremors, no numbness, no tingling, no dizziness Psychiatric: no depression, no anxiety   Objective:    BP (!) 159/85   Pulse 84   Ht 5\' 11"  (1.803 m)   Wt 294 lb 12.8 oz (133.7 kg)   BMI 41.12 kg/m   Wt Readings from Last 3 Encounters:  01/30/20 294 lb 12.8 oz (133.7 kg)  09/16/19 293 lb 12.8 oz (133.3 kg)  06/20/19 294 lb (133.4 kg)      Physical Exam- Limited  Constitutional:  Body mass index is 41.12 kg/m. , not in acute distress, normal state of mind Eyes:  EOMI, no exophthalmos Neck: Supple Thyroid: No gross goiter Respiratory: Adequate breathing efforts Musculoskeletal: no gross deformities, strength intact in all four  extremities, no gross restriction of joint movements Skin:  no rashes, no hyperemia Neurological: no tremor with outstretched hands,   CMP ( most recent) CMP     Component Value Date/Time   NA 139 10/11/2019 0856   NA 141 07/07/2017 0837   K 4.3 10/11/2019 0856   CL 101 10/11/2019 0856   CO2 32 10/11/2019 0856   GLUCOSE 131 (H) 10/11/2019 0856   BUN 13 10/11/2019 0856   BUN 15 07/07/2017 0837   CREATININE 0.99 10/11/2019 0856   CALCIUM 9.2 10/11/2019 0856   PROT 6.4 10/11/2019 0856   ALBUMIN 3.7 04/04/2019 0208   AST 29 10/11/2019 0856   ALT 46 10/11/2019 0856   ALKPHOS 90 04/04/2019 0208   BILITOT 0.9 10/11/2019 0856   GFRNONAA 93 10/11/2019 0856   GFRAA  108 10/11/2019 0856     Diabetic Labs (most recent): Lab Results  Component Value Date   HGBA1C 8.4 (A) 01/30/2020   HGBA1C 7.8 (H) 10/11/2019   HGBA1C 7.4 (H) 04/29/2019    Lipid Panel     Component Value Date/Time   CHOL 109 10/11/2019 0856   TRIG 185 (H) 10/11/2019 0856   HDL 20 (L) 10/11/2019 0856   CHOLHDL 5.5 (H) 10/11/2019 0856   VLDL 47 (H) 03/13/2019 0547   LDLCALC 63 10/11/2019 0856     Assessment & Plan:   1. Uncontrolled type 2 diabetes mellitus with chronic kidney disease, coronary artery disease, with long-term current use of insulin.  - Patient has currently uncontrolled symptomatic type 2 DM since  44 years of age.  He presents with NovoLog.  His meter shows 26 readings in the last 30 days-averaging 166.  He was supposed to monitor 4 times a day.  His point-of-care A1c is 8.4%, increasing from 7.8%.  He recently got his supplies for Lantus and Humalog.     Recent labs reviewed. -his diabetes is complicated by obesity/sedentary life, chronic heavy smoking and VICTORIO CREEDEN remains at a high risk for more acute and chronic complications which include CAD, CVA, CKD, retinopathy, and neuropathy. These are all discussed in detail with the patient.  - I have counseled him on diet management and  weight loss, by adopting a carbohydrate restricted/protein rich diet.  - he  admits there is a room for improvement in his diet and drink choices. -  Suggestion is made for him to avoid simple carbohydrates  from his diet including Cakes, Sweet Desserts / Pastries, Ice Cream, Soda (diet and regular), Sweet Tea, Candies, Chips, Cookies, Sweet Pastries,  Store Bought Juices, Alcohol in Excess of  1-2 drinks a day, Artificial Sweeteners, Coffee Creamer, and "Sugar-free" Products. This will help patient to have stable blood glucose profile and potentially avoid unintended weight gain.  - I encouraged him to switch to  unprocessed or minimally processed complex starch and increased protein intake (animal or plant source), fruits, and vegetables.  - he is advised to stick to a routine mealtimes to eat 3 meals  a day and avoid unnecessary snacks ( to snack only to correct hypoglycemia).   - I have approached him with the following individualized plan to manage diabetes and patient agrees:   -He will continue to require intensive treatment with basal/bolus insulin in order for him to achieve and maintain control of diabetes to target.   -He is approached to reengage for proper monitoring and resume Lantus at 80 units nightly, resume Humalog at 10 -16 units 3 times daily before meals, associated with strict monitoring of blood glucose 4 times a day-before meals and at bedtime .  -Patient is encouraged to call clinic for blood glucose levels less than 70 or above 200 mg /dl. -He is open to consider a CGM device this time.  I discussed and prescribed Diflucan libre device for him- after breakfast and supper.    -He declined offer for weekly incretin injection therapy.  - Patient specific target  A1c;  LDL, HDL, Triglycerides, and  Waist Circumference were discussed in detail.  2) BP/HTN: His blood pressure is not controlled to target.   He is advised to continue  with his current blood pressure  medications including lisinopril 40 mg p.o. daily along with his carvedilol 3.125 mg p.o. twice daily.    3) Lipids: His recent lipid panel showed controlled  LDL of 63.    He is advised to continue  omega-3 fatty acids 1000 units p.o. twice daily, and Lipitor 80 mg p.o. nightly.    4)  Weight/Diet: His BMI is 41.1-clearly complicating his diabetes care.  He is a candidate for major weight loss.  CDE Consult has been initiated  , exercise, and detailed carbohydrates information provided.  He is an ideal candidate for bariatric surgery.  He declined bariatric surgery as an option for weight control.    5) Chronic Care/Health Maintenance:  -he  is on ACEI/ARB and Statin medications and  is encouraged to continue to follow up with Ophthalmology, Dentist,  Podiatrist at least yearly or according to recommendations, and advised to  quit smoking. I have recommended yearly flu vaccine and pneumonia vaccination at least every 5 years; moderate intensity exercise for up to 150 minutes weekly; and  sleep for at least 7 hours a day.  - I advised patient to maintain close follow up with Practice, [facility] for primary care needs.  - Time spent on this patient care encounter:  35 min, of which > 50% was spent in  counseling and the rest reviewing his blood glucose logs , discussing his hypoglycemia and hyperglycemia episodes, reviewing his current and  previous labs / studies  ( including abstraction from other facilities) and medications  doses and developing a  long term treatment plan and documenting his care.   Please refer to Patient Instructions for Blood Glucose Monitoring and Insulin/Medications Dosing Guide"  in media tab for additional information. Please  also refer to " Patient Self Inventory" in the Media  tab for reviewed elements of pertinent patient history.  [name] participated in the discussions, expressed understanding, and voiced agreement with the above plans.  All questions were  answered to his satisfaction. he is encouraged to contact clinic should he have any questions or concerns prior to his return visit.   Follow up plan: - Return in about 3 months (around 04/30/2020) for Bring Meter and Logs- A1c in Office, Follow up with Pre-visit Labs.  Marquis Lunch, MD Phone: [phone]  Fax: [phone]  -  This note was partially dictated with voice recognition software. Similar sounding words can be transcribed inadequately or may not  be corrected upon review.  01/30/2020, 5:11 PM

## 2020-02-03 ENCOUNTER — Other Ambulatory Visit: Payer: Self-pay | Admitting: "Endocrinology

## 2020-02-06 ENCOUNTER — Other Ambulatory Visit: Payer: Self-pay

## 2020-02-06 MED ORDER — INSULIN ASPART 100 UNIT/ML FLEXPEN: 10.0000 [IU] | PEN_INJECTOR | Freq: Three times a day (TID) | SUBCUTANEOUS | 0 refills | Status: DC

## 2020-02-12 ENCOUNTER — Other Ambulatory Visit: Payer: Self-pay | Admitting: Cardiology

## 2020-02-13 ENCOUNTER — Other Ambulatory Visit: Payer: Self-pay | Admitting: Cardiology

## 2020-02-14 ENCOUNTER — Telehealth: Payer: Self-pay | Admitting: "Endocrinology

## 2020-02-14 MED ORDER — BLOOD GLUCOSE MONITOR KIT: 1.0000 | PACK | Freq: Four times a day (QID) | 0 refills | Status: AC

## 2020-02-14 MED ORDER — PEN NEEDLES 31G X 8 MM MISC: 1.0000 | Freq: Four times a day (QID) | 3 refills | Status: DC

## 2020-02-14 NOTE — Telephone Encounter (Signed)
Patient called and said insurance will not cover novolog and that humalog is the only insulin that keeps his sugar down and it requires a prior auth. He also said that the insurance will not cover the freestyle Josephine Igo so he is asking for a generic prescription sent into his pharmacy so he can get a regular meter with strips. He also needs pen needles for his insulin.

## 2020-02-14 NOTE — Telephone Encounter (Signed)
Sent rxs for pen needles and glucose monitor, will start process for prior auth for humalog.

## 2020-02-20 ENCOUNTER — Telehealth: Payer: Self-pay | Admitting: Cardiology

## 2020-02-20 NOTE — Telephone Encounter (Signed)
Patient called stating that [facility] [place] told him that they do not have RX's for Brilinta & Carvedilol

## 2020-02-20 NOTE — Telephone Encounter (Signed)
Spoke with [facility] [place] to confirm receipt of carvedilol and brilinita and they have both ready for pt to pick up - pt aware

## 2020-02-20 NOTE — Telephone Encounter (Signed)
Pt calling to check on status of his humalog. Selena Batten said she laid the information on your desk? Do you have it? Please Advise.

## 2020-02-20 NOTE — Telephone Encounter (Signed)
Left a message requesting a return call to the office. 

## 2020-02-21 NOTE — Telephone Encounter (Signed)
Left a message requesting a return call to the office. 

## 2020-02-29 ENCOUNTER — Other Ambulatory Visit: Payer: Self-pay

## 2020-02-29 MED ORDER — INSULIN LISPRO (1 UNIT DIAL) 100 UNIT/ML (KWIKPEN): 10.0000 [IU] | PEN_INJECTOR | Freq: Three times a day (TID) | SUBCUTANEOUS | 0 refills | Status: DC

## 2020-03-05 ENCOUNTER — Other Ambulatory Visit: Payer: Self-pay | Admitting: Cardiology

## 2020-03-08 NOTE — Telephone Encounter (Signed)
Pt left a VM that the pharmacy advised him that the PA still has not gone through for his Lantus or Humalog. He would like a call back

## 2020-03-08 NOTE — Telephone Encounter (Signed)
Called pt, advised him we would send prior authorization request again.

## 2020-03-13 ENCOUNTER — Other Ambulatory Visit: Payer: Self-pay | Admitting: "Endocrinology

## 2020-03-13 ENCOUNTER — Telehealth: Payer: Self-pay | Admitting: "Endocrinology

## 2020-03-13 MED ORDER — LANTUS SOLOSTAR 100 UNIT/ML ~~LOC~~ SOPN: 80.0000 [IU] | PEN_INJECTOR | Freq: Every day | SUBCUTANEOUS | 0 refills | Status: DC

## 2020-03-13 NOTE — Telephone Encounter (Signed)
Rx refill resent for 90 day supply

## 2020-03-13 NOTE — Telephone Encounter (Signed)
Patient is calling and states lantus was called in for 30 days and his insurance will only cover 90 days. Patient is out and would like this called in and fixed today if possible.   [id number] [place], [place] - [address] AT [place] [address] [phone] (Phone) [phone] (Fax)

## 2020-03-28 ENCOUNTER — Other Ambulatory Visit: Payer: Self-pay

## 2020-03-28 ENCOUNTER — Ambulatory Visit: Payer: 59 | Attending: Internal Medicine

## 2020-03-28 ENCOUNTER — Ambulatory Visit: Payer: 59 | Admitting: Cardiology

## 2020-03-28 DIAGNOSIS — Z20822 Contact with and (suspected) exposure to covid-19: Secondary | ICD-10-CM | POA: Diagnosis not present

## 2020-03-29 LAB — NOVEL CORONAVIRUS, NAA: SARS-CoV-2, NAA: NOT DETECTED

## 2020-03-29 LAB — SARS-COV-2, NAA 2 DAY TAT

## 2020-03-30 ENCOUNTER — Ambulatory Visit: Payer: 59 | Admitting: Cardiology

## 2020-04-27 ENCOUNTER — Other Ambulatory Visit: Payer: Self-pay

## 2020-04-27 ENCOUNTER — Ambulatory Visit: Payer: 59 | Admitting: Cardiology

## 2020-04-27 NOTE — Progress Notes (Deleted)
Cardiology Office Note  Date: 04/27/2020   ID: DEADRIAN TOYA, DOB [birth year], MRN [id number]  PCP:  Rosalee Kaufman, PA-C  Cardiologist:  Rozann Lesches, MD Electrophysiologist:  None   No chief complaint on file.   History of Present Illness: DENI LEFEVER is a 44 y.o. male last seen by Mr. Leonides Sake NP in December 2020.  Past Medical History:  Diagnosis Date  . CAD (coronary artery disease), native coronary artery    10/18 PCI/DES to mRCA, and OM, with total occlusion of dLCx with collaterals.   . Essential hypertension   . GERD (gastroesophageal reflux disease)   . History of kidney stones   . Hyperlipemia   . SVT (supraventricular tachycardia) ([place]) 2015   Converted with adenosine  . Type 2 diabetes mellitus ([place])     Past Surgical History:  Procedure Laterality Date  . CARDIAC SURGERY    . CORONARY ANGIOPLASTY WITH STENT PLACEMENT  07/15/2017   "2 stents"  . CORONARY/GRAFT ACUTE MI REVASCULARIZATION N/A 03/12/2019   Procedure: Coronary/Graft Acute MI Revascularization;  Surgeon: Nelva Bush, MD;  Location: [facility];  Service: Cardiovascular;  Laterality: N/A;  . LAPAROSCOPIC APPENDECTOMY N/A 11/12/2017   Procedure: APPENDECTOMY LAPAROSCOPIC, REPAIR OF INCARCERATED INCISIONAL HERNIA;  Surgeon: Michael Boston, MD;  Location: [facility];  Service: General;  Laterality: N/A;  . LAPAROSCOPIC CHOLECYSTECTOMY    . LEFT HEART CATH AND CORONARY ANGIOGRAPHY N/A 07/15/2017   Procedure: LEFT HEART CATH AND CORONARY ANGIOGRAPHY;  Surgeon: Sherren Mocha, MD;  Location: [facility];  Service: Cardiovascular;  Laterality: N/A;  . LEFT HEART CATH AND CORONARY ANGIOGRAPHY N/A 03/12/2019   Procedure: LEFT HEART CATH AND CORONARY ANGIOGRAPHY;  Surgeon: Nelva Bush, MD;  Location: [facility];  Service: Cardiovascular;  Laterality: N/A;    Current Outpatient Medications  Medication Sig Dispense Refill  . ALPRAZolam (XANAX) 1 MG tablet Take 1 mg by mouth  daily as needed for anxiety.    Marland Kitchen aspirin EC 81 MG tablet Take 1 tablet (81 mg total) by mouth daily with breakfast. 120 tablet 5  . atorvastatin (LIPITOR) 80 MG tablet TAKE 1 TABLET BY MOUTH EVERYDAY AT BEDTIME 90 tablet 1  . blood glucose meter kit and supplies KIT 1 each by Does not apply route 4 (four) times daily. Dispense based on patient and insurance preference. Use up to four times daily as directed. (FOR ICD-9 250.00, 250.01). 1 each 0  . BRILINTA 90 MG TABS tablet TAKE 1 TABLET (90 MG TOTAL) BY MOUTH 2 (TWO) TIMES DAILY. 180 tablet 0  . carvedilol (COREG) 3.125 MG tablet TAKE 1 TABLET (3.125 MG TOTAL) BY MOUTH 2 (TWO) TIMES DAILY WITH A MEAL. 180 tablet 1  . Continuous Blood Gluc Sensor (FREESTYLE LIBRE 14 DAY SENSOR) MISC Inject 1 each into the skin every 14 (fourteen) days. Use as directed. 2 each 2  . insulin glargine (LANTUS SOLOSTAR) 100 UNIT/ML Solostar Pen Inject 80 Units into the skin at bedtime. 72 mL 0  . insulin lispro (HUMALOG) 100 UNIT/ML KwikPen Inject 0.1-0.16 mLs (10-16 Units total) into the skin 3 (three) times daily with meals. 15 mL 0  . Insulin Pen Needle (PEN NEEDLES) 31G X 8 MM MISC 1 each by Does not apply route 4 (four) times daily. 100 each 3  . losartan (COZAAR) 50 MG tablet TAKE 1 TABLET BY MOUTH EVERY DAY 90 tablet 1  . metFORMIN (GLUCOPHAGE-XR) 500 MG 24 hr tablet Take 1 tablet (500  mg total) by mouth 2 (two) times daily. 180 tablet 0  . nitroGLYCERIN (NITROSTAT) 0.4 MG SL tablet Place 1 tablet (0.4 mg total) under the tongue every 5 (five) minutes x 3 doses as needed for chest pain. 25 tablet 1  . Omega-3 1000 MG CAPS Take 1,000 mg by mouth 2 (two) times daily.     No current facility-administered medications for this visit.   Allergies:  Bee venom and Penicillins   Social History: The patient  reports that he quit smoking about 13 months ago. His smoking use included cigarettes. He has a 13.00 pack-year smoking history. He has never used smokeless tobacco.  He reports that he does not drink alcohol and does not use drugs.   Family History: The patient's family history includes Diabetes in his mother; Gout in his mother; Heart attack (age of onset: 22) in his father; Heart disease (age of onset: 84) in his mother.   ROS:  Please see the history of present illness. Otherwise, complete review of systems is positive for {NONE DEFAULTED:18576::"none"}.  All other systems are reviewed and negative.   Physical Exam: VS:  There were no vitals taken for this visit., BMI There is no height or weight on file to calculate BMI.  Wt Readings from Last 3 Encounters:  01/30/20 294 lb 12.8 oz (133.7 kg)  09/16/19 293 lb 12.8 oz (133.3 kg)  06/20/19 294 lb (133.4 kg)    General: Patient appears comfortable at rest. HEENT: Conjunctiva and lids normal, oropharynx clear with moist mucosa. Neck: Supple, no elevated JVP or carotid bruits, no thyromegaly. Lungs: Clear to auscultation, nonlabored breathing at rest. Cardiac: Regular rate and rhythm, no S3 or significant systolic murmur, no pericardial rub. Abdomen: Soft, nontender, no hepatomegaly, bowel sounds present, no guarding or rebound. Extremities: No pitting edema, distal pulses 2+. Skin: Warm and dry. Musculoskeletal: No kyphosis. Neuropsychiatric: Alert and oriented x3, affect grossly appropriate.  ECG:  An ECG dated 04/03/2019 was personally reviewed today and demonstrated:  SVT.  Recent Labwork: 10/11/2019: ALT 46; AST 29; BUN 13; Creat 0.99; Potassium 4.3; Sodium 139     Component Value Date/Time   CHOL 109 10/11/2019 0856   TRIG 185 (H) 10/11/2019 0856   HDL 20 (L) 10/11/2019 0856   CHOLHDL 5.5 (H) 10/11/2019 0856   VLDL 47 (H) 03/13/2019 0547   LDLCALC 63 10/11/2019 0856    Other Studies Reviewed Today:  Echocardiogram 07/26/2019: 1. Left ventricular ejection fraction, by visual estimation, is 65 to  70%. The left ventricle has hyperdynamic function. There is mildly  increased left  ventricular posterior wall hypertrophy.  2. Elevated left ventricular end-diastolic pressure.  3. Left ventricular diastolic Doppler parameters are consistent with  impaired relaxation pattern of LV diastolic filling.  4. Global right ventricle has normal systolic function.The right  ventricular size is normal. Right vetricular wall thickness was not  assessed.  5. Left atrial size was normal.  6. Right atrial size was normal.  7. The mitral valve is grossly normal. Mild mitral valve regurgitation.  8. The tricuspid valve is grossly normal. Tricuspid valve regurgitation  is trivial.  9. The aortic valve is tricuspid Aortic valve regurgitation was not  visualized by color flow Doppler. Mild aortic valve sclerosis without  stenosis.  10. The pulmonic valve was grossly normal. Pulmonic valve regurgitation is  not visualized by color flow Doppler.  11. Normal pulmonary artery systolic pressure.  12. The inferior vena cava IVC is small suggestive of low RAP.  Assessment and Plan:    Medication Adjustments/Labs and Tests Ordered: Current medicines are reviewed at length with the patient today.  Concerns regarding medicines are outlined above.   Tests Ordered: No orders of the defined types were placed in this encounter.   Medication Changes: No orders of the defined types were placed in this encounter.   Disposition:  Follow up {follow up:15908}  Signed, Satira Sark, MD, [facility] 04/27/2020 1:03 PM    [facility] [place] [facility] [place], [place], [place] [address] Phone: [phone]; Fax: [phone]

## 2020-04-30 DIAGNOSIS — I10 Essential (primary) hypertension: Secondary | ICD-10-CM | POA: Diagnosis not present

## 2020-04-30 DIAGNOSIS — E782 Mixed hyperlipidemia: Secondary | ICD-10-CM | POA: Diagnosis not present

## 2020-04-30 DIAGNOSIS — I2510 Atherosclerotic heart disease of native coronary artery without angina pectoris: Secondary | ICD-10-CM | POA: Diagnosis not present

## 2020-04-30 DIAGNOSIS — E1165 Type 2 diabetes mellitus with hyperglycemia: Secondary | ICD-10-CM | POA: Diagnosis not present

## 2020-04-30 DIAGNOSIS — Z7901 Long term (current) use of anticoagulants: Secondary | ICD-10-CM | POA: Diagnosis not present

## 2020-04-30 DIAGNOSIS — I255 Ischemic cardiomyopathy: Secondary | ICD-10-CM | POA: Diagnosis not present

## 2020-04-30 DIAGNOSIS — Z720 Tobacco use: Secondary | ICD-10-CM | POA: Diagnosis not present

## 2020-05-01 LAB — T4, FREE: Free T4: 1.1 ng/dL (ref 0.8–1.8)

## 2020-05-01 LAB — COMPLETE METABOLIC PANEL WITH GFR
AG Ratio: 1.9 (calc) (ref 1.0–2.5)
ALT: 47 U/L — ABNORMAL HIGH (ref 9–46)
AST: 30 U/L (ref 10–40)
Albumin: 4.4 g/dL (ref 3.6–5.1)
Alkaline phosphatase (APISO): 107 U/L (ref 36–130)
BUN: 12 mg/dL (ref 7–25)
CO2: 25 mmol/L (ref 20–32)
Calcium: 9.3 mg/dL (ref 8.6–10.3)
Chloride: 102 mmol/L (ref 98–110)
Creat: 0.91 mg/dL (ref 0.60–1.35)
GFR, Est African American: 118 mL/min/{1.73_m2} (ref >=60)
GFR, Est Non African American: 102 mL/min/{1.73_m2} (ref >=60)
Globulin: 2.3 g/dL (calc) (ref 1.9–3.7)
Glucose, Bld: 180 mg/dL — ABNORMAL HIGH (ref 65–99)
Potassium: 4.3 mmol/L (ref 3.5–5.3)
Sodium: 138 mmol/L (ref 135–146)
Total Bilirubin: 0.8 mg/dL (ref 0.2–1.2)
Total Protein: 6.7 g/dL (ref 6.1–8.1)

## 2020-05-01 LAB — MICROALBUMIN / CREATININE URINE RATIO
Creatinine, Urine: 199 mg/dL (ref 20–320)
Microalb Creat Ratio: 5 mcg/mg creat (ref <30)
Microalb, Ur: 1 mg/dL

## 2020-05-01 LAB — VITAMIN D 25 HYDROXY (VIT D DEFICIENCY, FRACTURES): Vit D, 25-Hydroxy: 30 ng/mL (ref 30–100)

## 2020-05-01 LAB — TSH: TSH: 1.45 mIU/L (ref 0.40–4.50)

## 2020-05-02 ENCOUNTER — Other Ambulatory Visit: Payer: Self-pay

## 2020-05-02 ENCOUNTER — Ambulatory Visit (INDEPENDENT_AMBULATORY_CARE_PROVIDER_SITE_OTHER): Payer: 59 | Admitting: "Endocrinology

## 2020-05-02 ENCOUNTER — Encounter: Payer: Self-pay | Admitting: "Endocrinology

## 2020-05-02 VITALS — BP 140/76 | HR 84 | Ht 71.0 in | Wt 290.0 lb

## 2020-05-02 DIAGNOSIS — I10 Essential (primary) hypertension: Secondary | ICD-10-CM | POA: Diagnosis not present

## 2020-05-02 DIAGNOSIS — E1165 Type 2 diabetes mellitus with hyperglycemia: Secondary | ICD-10-CM | POA: Diagnosis not present

## 2020-05-02 DIAGNOSIS — E782 Mixed hyperlipidemia: Secondary | ICD-10-CM | POA: Diagnosis not present

## 2020-05-02 LAB — POCT GLYCOSYLATED HEMOGLOBIN (HGB A1C): Hemoglobin A1C: 9.1 % — AB (ref 4.0–5.6)

## 2020-05-02 MED ORDER — INSULIN LISPRO (1 UNIT DIAL) 100 UNIT/ML (KWIKPEN): 14.0000 [IU] | PEN_INJECTOR | Freq: Three times a day (TID) | SUBCUTANEOUS | 1 refills | Status: DC

## 2020-05-02 MED ORDER — DEXCOM G6 TRANSMITTER MISC: 1.0000 | 1 refills | Status: DC

## 2020-05-02 MED ORDER — DEXCOM G6 SENSOR MISC
4.0000 | 2 refills | Status: DC
Start: 2020-05-02 — End: 2020-09-05

## 2020-05-02 MED ORDER — DEXCOM G6 RECEIVER DEVI
1.0000 | 0 refills | Status: DC | PRN
Start: 2020-05-02 — End: 2020-09-05

## 2020-05-02 NOTE — Progress Notes (Signed)
05/02/2020    Endocrinology follow-up note    Subjective:    Patient ID: Lance [name], male   DOB: 07/07/1976.  Is being seen in follow-up for management of currently uncontrolled type 2 diabetes, complicated by coronary artery disease recently which required stent placement, hyperlipidemia, hypertension.   Past Medical History:  Diagnosis Date  . CAD (coronary artery disease), native coronary artery    10/18 PCI/DES to mRCA, and OM, with total occlusion of dLCx with collaterals.   . Essential hypertension   . GERD (gastroesophageal reflux disease)   . History of kidney stones   . Hyperlipemia   . SVT (supraventricular tachycardia) ([place]) 2015   Converted with adenosine  . Type 2 diabetes mellitus ([place])    Past Surgical History:  Procedure Laterality Date  . CARDIAC SURGERY    . CORONARY ANGIOPLASTY WITH STENT PLACEMENT  07/15/2017   "2 stents"  . CORONARY/GRAFT ACUTE MI REVASCULARIZATION N/A 03/12/2019   Procedure: Coronary/Graft Acute MI Revascularization;  Surgeon: Nelva Bush, MD;  Location: [facility];  Service: Cardiovascular;  Laterality: N/A;  . LAPAROSCOPIC APPENDECTOMY N/A 11/12/2017   Procedure: APPENDECTOMY LAPAROSCOPIC, REPAIR OF INCARCERATED INCISIONAL HERNIA;  Surgeon: Michael Boston, MD;  [facility]: [facility];  Service: General;  Laterality: N/A;  . LAPAROSCOPIC CHOLECYSTECTOMY    . LEFT HEART CATH AND CORONARY ANGIOGRAPHY N/A 07/15/2017   Procedure: LEFT HEART CATH AND CORONARY ANGIOGRAPHY;  Surgeon: Sherren Mocha, MD;  Location: [facility];  Service: Cardiovascular;  Laterality: N/A;  . LEFT HEART CATH AND CORONARY ANGIOGRAPHY N/A 03/12/2019   Procedure: LEFT HEART CATH AND CORONARY ANGIOGRAPHY;  Surgeon: Nelva Bush, MD;  Location: [facility];  Service: Cardiovascular;  Laterality: N/A;   Social History   Socioeconomic History  . Marital status: Married    Spouse name: Not on file  . Number of children: Not on file  . Years of  education: Not on file  . Highest education level: Not on file  Occupational History  . Occupation: Pharmacist, hospital: [employer]  Tobacco Use  . Smoking status: Former Smoker    Packs/day: 0.50    Years: 26.00    Pack years: 13.00    Types: Cigarettes    Quit date: 03/07/2019    Years since quitting: 1.1  . Smokeless tobacco: Never Used  Vaping Use  . Vaping Use: Never used  Substance and Sexual Activity  . Alcohol use: No  . Drug use: No  . Sexual activity: Yes    Birth control/protection: None  Other Topics Concern  . Not on file  Social History Narrative  . Not on file   Social Determinants of Health   Financial Resource Strain:   . Difficulty of Paying Living Expenses:   Food Insecurity:   . Worried About Charity fundraiser in the Last Year:   . Arboriculturist in the Last Year:   Transportation Needs:   . Film/video editor (Medical):   Marland Kitchen Lack of Transportation (Non-Medical):   Physical Activity:   . Days of Exercise per Week:   . Minutes of Exercise per Session:   Stress:   . Feeling of Stress :   Social Connections:   . Frequency of Communication with Friends and Family:   . Frequency of Social Gatherings with Friends and Family:   . Attends Religious Services:   . Active Member of Clubs or Organizations:   . Attends Archivist  Meetings:   Marland Kitchen Marital Status:    Outpatient Encounter Medications as of 05/02/2020  Medication Sig  . ALPRAZolam (XANAX) 1 MG tablet Take 1 mg by mouth daily as needed for anxiety.  Marland Kitchen aspirin EC 81 MG tablet Take 1 tablet (81 mg total) by mouth daily with breakfast.  . atorvastatin (LIPITOR) 80 MG tablet TAKE 1 TABLET BY MOUTH EVERYDAY AT BEDTIME  . blood glucose meter kit and supplies KIT 1 each by Does not apply route 4 (four) times daily. Dispense based on patient and insurance preference. Use up to four times daily as directed. (FOR ICD-9 250.00, 250.01).  . BRILINTA 90 MG TABS tablet TAKE 1 TABLET  (90 MG TOTAL) BY MOUTH 2 (TWO) TIMES DAILY.  . carvedilol (COREG) 3.125 MG tablet TAKE 1 TABLET (3.125 MG TOTAL) BY MOUTH 2 (TWO) TIMES DAILY WITH A MEAL.  Marland Kitchen Continuous Blood Gluc Receiver (DEXCOM G6 RECEIVER) DEVI 1 Piece by Does not apply route as needed.  . Continuous Blood Gluc Sensor (DEXCOM G6 SENSOR) MISC 4 Pieces by Does not apply route once a week.  . Continuous Blood Gluc Transmit (DEXCOM G6 TRANSMITTER) MISC 1 Piece by Does not apply route as directed.  . insulin glargine (LANTUS SOLOSTAR) 100 UNIT/ML Solostar Pen Inject 80 Units into the skin at bedtime.  . insulin lispro (HUMALOG) 100 UNIT/ML KwikPen Inject 0.14-0.2 mLs (14-20 Units total) into the skin 3 (three) times daily with meals.  . Insulin Pen Needle (PEN NEEDLES) 31G X 8 MM MISC 1 each by Does not apply route 4 (four) times daily.  Marland Kitchen losartan (COZAAR) 50 MG tablet TAKE 1 TABLET BY MOUTH EVERY DAY  . metFORMIN (GLUCOPHAGE-XR) 500 MG 24 hr tablet Take 1 tablet (500 mg total) by mouth 2 (two) times daily.  . nitroGLYCERIN (NITROSTAT) 0.4 MG SL tablet Place 1 tablet (0.4 mg total) under the tongue every 5 (five) minutes x 3 doses as needed for chest pain.  . Omega-3 1000 MG CAPS Take 1,000 mg by mouth 2 (two) times daily.  . [DISCONTINUED] Continuous Blood Gluc Sensor (FREESTYLE LIBRE 14 DAY SENSOR) MISC Inject 1 each into the skin every 14 (fourteen) days. Use as directed.  . [DISCONTINUED] insulin lispro (HUMALOG) 100 UNIT/ML KwikPen Inject 0.1-0.16 mLs (10-16 Units total) into the skin 3 (three) times daily with meals.   No facility-administered encounter medications on file as of 05/02/2020.   ALLERGIES: Allergies  Allergen Reactions  . Bee Venom Anaphylaxis and Swelling  . Penicillins Hives, Itching and Rash    Has patient had a PCN reaction causing immediate rash, facial/tongue/throat swelling, SOB or lightheadedness with hypotension: No Has patient had a PCN reaction causing severe rash involving mucus membranes or skin  necrosis: No Has patient had a PCN reaction that required hospitalization: No Has patient had a PCN reaction occurring within the last 10 years: No If all of the above answers are "NO", then may proceed with Cephalosporin use.    VACCINATION STATUS:  There is no immunization history on file for this patient.  Diabetes He presents for his follow-up diabetic visit. He has type 2 diabetes mellitus. Onset time: He was diagnosed at approximate age of 31 years. His disease course has been worsening (Was recently hospitalized due to appendicitis after 2 months of antibiotic treatment.  He is status post appendectomy.  ). There are no hypoglycemic associated symptoms. Pertinent negatives for hypoglycemia include no confusion, headaches, pallor or seizures. Pertinent negatives for diabetes include no blurred vision, no  chest pain, no fatigue, no polydipsia, no polyphagia, no polyuria and no weakness. There are no hypoglycemic complications. Symptoms are worsening. There are no diabetic complications. Risk factors for coronary artery disease include diabetes mellitus, dyslipidemia, family history, hypertension, male sex, obesity, tobacco exposure and sedentary lifestyle. Current diabetic treatment includes intensive insulin program (He is on Levemir 68 units BID and Humalog 10 units TIDAC.). His weight is fluctuating minimally. He is following a generally unhealthy diet. When asked about meal planning, he reported none. He has not had a previous visit with a dietitian. He rarely participates in exercise. His home blood glucose trend is increasing steadily. His breakfast blood glucose range is generally 180-200 mg/dl. His lunch blood glucose range is generally 180-200 mg/dl. His dinner blood glucose range is generally 180-200 mg/dl. His bedtime blood glucose range is generally 180-200 mg/dl. His overall blood glucose range is 180-200 mg/dl. (He presents with his logs showing consistently above target glycemic  profile.  His point-of-care A1c is 9.1% increasing from 8.4%.  ) An ACE inhibitor/angiotensin II receptor blocker is being taken. He does not see a podiatrist.Eye exam is not current.  Hyperlipidemia This is a chronic problem. The current episode started more than 1 year ago. The problem is controlled. Exacerbating diseases include diabetes and obesity. Pertinent negatives include no chest pain, myalgias or shortness of breath. Current antihyperlipidemic treatment includes statins and bile acid squestrants. The current treatment provides significant improvement of lipids. Risk factors for coronary artery disease include diabetes mellitus, dyslipidemia, hypertension, male sex, obesity and a sedentary lifestyle.  Hypertension This is a chronic problem. The current episode started more than 1 year ago. Pertinent negatives include no blurred vision, chest pain, headaches, neck pain, palpitations or shortness of breath. Risk factors for coronary artery disease include dyslipidemia, diabetes mellitus, male gender, smoking/tobacco exposure and sedentary lifestyle. Past treatments include ACE inhibitors.     Review of systems  Constitutional: + Minimally fluctuating body weight,  current  Body mass index is 40.45 kg/m. , no fatigue, no subjective hyperthermia, no subjective hypothermia Eyes: no blurry vision, no xerophthalmia ENT: no sore throat, no nodules palpated in throat, no dysphagia/odynophagia, no hoarseness Cardiovascular: no Chest Pain, no Shortness of Breath, no palpitations, no leg swelling Respiratory: no cough, no shortness of breath Gastrointestinal: no Nausea/Vomiting/Diarhhea Musculoskeletal: no muscle/joint aches Skin: no rashes, no hyperemia Neurological: no tremors, no numbness, no tingling, no dizziness Psychiatric: no depression, no anxiety   Objective:    BP (!) 140/76   Pulse 84   Ht 5' 11"  (1.803 m)   Wt (!) 290 lb (131.5 kg)   BMI 40.45 kg/m   Wt Readings from Last 3  Encounters:  05/02/20 (!) 290 lb (131.5 kg)  01/30/20 294 lb 12.8 oz (133.7 kg)  09/16/19 293 lb 12.8 oz (133.3 kg)      Physical Exam- Limited  Constitutional:  Body mass index is 40.45 kg/m. , not in acute distress, normal state of mind Eyes:  EOMI, no exophthalmos Neck: Supple Thyroid: No gross goiter Respiratory: Adequate breathing efforts Musculoskeletal: no gross deformities, strength intact in all four extremities, no gross restriction of joint movements Skin:  no rashes, no hyperemia Neurological: no tremor with outstretched hands,   CMP ( most recent) CMP     Component Value Date/Time   NA 138 04/30/2020 0728   NA 141 07/07/2017 0837   K 4.3 04/30/2020 0728   CL 102 04/30/2020 0728   CO2 25 04/30/2020 0728   GLUCOSE 180 (  H) 04/30/2020 0728   BUN 12 04/30/2020 0728   BUN 15 07/07/2017 0837   CREATININE 0.91 04/30/2020 0728   CALCIUM 9.3 04/30/2020 0728   PROT 6.7 04/30/2020 0728   ALBUMIN 3.7 04/04/2019 0208   AST 30 04/30/2020 0728   ALT 47 (H) 04/30/2020 0728   ALKPHOS 90 04/04/2019 0208   BILITOT 0.8 04/30/2020 0728   GFRNONAA 102 04/30/2020 0728   GFRAA 118 04/30/2020 0728     Diabetic Labs (most recent): Lab Results  Component Value Date   HGBA1C 9.1 (A) 05/02/2020   HGBA1C 8.4 (A) 01/30/2020   HGBA1C 7.8 (H) 10/11/2019    Lipid Panel     Component Value Date/Time   CHOL 109 10/11/2019 0856   TRIG 185 (H) 10/11/2019 0856   HDL 20 (L) 10/11/2019 0856   CHOLHDL 5.5 (H) 10/11/2019 0856   VLDL 47 (H) 03/13/2019 0547   LDLCALC 63 10/11/2019 0856     Assessment & Plan:   1. Uncontrolled type 2 diabetes mellitus with chronic kidney disease, coronary artery disease, with long-term current use of insulin.  - Patient has currently uncontrolled symptomatic type 2 DM since  44 years of age. He presents with his logs showing consistently above target glycemic profile.  His point-of-care A1c is 9.1% increasing from 8.4%.    Recent labs  reviewed. -his diabetes is complicated by obesity/sedentary life, chronic heavy smoking and RANDEL HARGENS remains at a high risk for more acute and chronic complications which include CAD, CVA, CKD, retinopathy, and neuropathy. These are all discussed in detail with the patient.  - I have counseled him on diet management and weight loss, by adopting a carbohydrate restricted/protein rich diet. - he  admits there is a room for improvement in his diet and drink choices. -  Suggestion is made for him to avoid simple carbohydrates  from his diet including Cakes, Sweet Desserts / Pastries, Ice Cream, Soda (diet and regular), Sweet Tea, Candies, Chips, Cookies, Sweet Pastries,  Store Bought Juices, Alcohol in Excess of  1-2 drinks a day, Artificial Sweeteners, Coffee Creamer, and "Sugar-free" Products. This will help patient to have stable blood glucose profile and potentially avoid unintended weight gain.   - I encouraged him to switch to  unprocessed or minimally processed complex starch and increased protein intake (animal or plant source), fruits, and vegetables.  - he is advised to stick to a routine mealtimes to eat 3 meals  a day and avoid unnecessary snacks ( to snack only to correct hypoglycemia).   - I have approached him with the following individualized plan to manage diabetes and patient agrees:   -He will continue to require intensive treatment with basal/bolus insulin in order for him to achieve and maintain control of diabetes to target.   -He is approached to continue to monitor blood glucose 4 times a day-before meals and at bedtime.  Is advised to continue Lantus at 80 units nightly, increase Humalog to 14-20  units 3 times daily before meals, associated with strict monitoring of blood glucose 4 times a day-before meals and at bedtime .  -Patient is encouraged to call clinic for blood glucose levels less than 70 or above 200 mg /dl. -He is open to consider a CGM device this time.  I  discussed and prescribed Dexcom for him, his insurance did not cover the freestyle libre device.  -He is advised to continue with Metformin 500 mg XR p.o. twice daily. -He declined offer for weekly incretin  injection therapy.  - Patient specific target  A1c;  LDL, HDL, Triglycerides,  were discussed in detail.  2) BP/HTN: -His blood pressure is  Not controlled to target.   He is advised to continue  with his current blood pressure medications including lisinopril 40 mg p.o. daily along with his carvedilol 3.125 mg p.o. twice daily.    3) Lipids: His recent lipid panel showed controlled LDL of 63.    He is advised to continue  omega-3 fatty acids 1000 units p.o. twice daily, and Lipitor 80 mg p.o. nightly.    4)  Weight/Diet: His BMI is 40.16-KOECXFQ complicating his diabetes care.  He is a candidate for major weight loss.  CDE Consult has been initiated  , exercise, and detailed carbohydrates information provided.  He is an ideal candidate for bariatric surgery.  He declined bariatric surgery as an option for weight control.    5) Chronic Care/Health Maintenance:  -he  is on ACEI/ARB and Statin medications and  is encouraged to continue to follow up with Ophthalmology, Dentist,  Podiatrist at least yearly or according to recommendations, and advised to  quit smoking. I have recommended yearly flu vaccine and pneumonia vaccination at least every 5 years; moderate intensity exercise for up to 150 minutes weekly; and  sleep for at least 7 hours a day.  - I advised patient to maintain close follow up with Rosalee Kaufman, PA-C for primary care needs.  - Time spent on this patient care encounter:  35 min, of which > 50% was spent in  counseling and the rest reviewing his blood glucose logs , discussing his hypoglycemia and hyperglycemia episodes, reviewing his current and  previous labs / studies  ( including abstraction from other facilities) and medications  doses and developing a  long term  treatment plan and documenting his care.   Please refer to Patient Instructions for Blood Glucose Monitoring and Insulin/Medications Dosing Guide"  in media tab for additional information. Please  also refer to " Patient Self Inventory" in the Media  tab for reviewed elements of pertinent patient history.  Redmond Pulling participated in the discussions, expressed understanding, and voiced agreement with the above plans.  All questions were answered to his satisfaction. he is encouraged to contact clinic should he have any questions or concerns prior to his return visit.    Follow up plan: - Return in about 4 months (around 09/02/2020) for Bring Meter and Logs- A1c in Office.  Glade Lloyd, MD Phone: [phone]  Fax: [phone]  -  This note was partially dictated with voice recognition software. Similar sounding words can be transcribed inadequately or may not  be corrected upon review.  05/02/2020, 6:59 PM

## 2020-05-02 NOTE — Patient Instructions (Signed)

## 2020-05-07 ENCOUNTER — Telehealth: Payer: Self-pay

## 2020-05-07 ENCOUNTER — Encounter: Payer: Self-pay | Admitting: Cardiology

## 2020-05-07 ENCOUNTER — Ambulatory Visit (INDEPENDENT_AMBULATORY_CARE_PROVIDER_SITE_OTHER): Payer: 59 | Admitting: Cardiology

## 2020-05-07 ENCOUNTER — Other Ambulatory Visit: Payer: Self-pay

## 2020-05-07 VITALS — BP 130/70 | HR 81 | Ht 71.0 in | Wt 290.0 lb

## 2020-05-07 DIAGNOSIS — E782 Mixed hyperlipidemia: Secondary | ICD-10-CM

## 2020-05-07 DIAGNOSIS — I471 Supraventricular tachycardia: Secondary | ICD-10-CM | POA: Diagnosis not present

## 2020-05-07 DIAGNOSIS — I25119 Atherosclerotic heart disease of native coronary artery with unspecified angina pectoris: Secondary | ICD-10-CM | POA: Diagnosis not present

## 2020-05-07 NOTE — Patient Instructions (Addendum)

## 2020-05-07 NOTE — Progress Notes (Signed)
Cardiology Office Note  Date: 05/07/2020   ID: Lance [name], DOB 1976/06/08, MRN [id number]  PCP:  Rosalee Kaufman, PA-C  Cardiologist:  Rozann Lesches, MD Electrophysiologist:  None   Chief Complaint  Patient presents with  . Cardiac follow-up    History of Present Illness: Lance [name] is a 44 y.o. male last seen by Mr. Leonides Sake NP in December 2020.  He presents for a routine visit.  He does not describe any active angina symptoms.  Does have occasional rapid palpitations, but states that these are brief, nonsustained.  He continues to work full-time.  Reviewed his medications which are stable and outlined below.  He is tolerating Coreg 3.125 mg twice daily.  Has not been able to take higher dose.  I personally reviewed his ECG today which shows normal sinus rhythm.  He did have lab work done recently, last LDL 63.  He is following with Dr. Dorris Fetch for endocrinology management.  Past Medical History:  Diagnosis Date  . CAD (coronary artery disease), native coronary artery    10/18 PCI/DES to mRCA, and OM, with total occlusion of dLCx with collaterals.   . Essential hypertension   . GERD (gastroesophageal reflux disease)   . History of kidney stones   . Hyperlipemia   . SVT (supraventricular tachycardia) ([place]) 2015   Converted with adenosine  . Type 2 diabetes mellitus ([place])     Past Surgical History:  Procedure Laterality Date  . CARDIAC SURGERY    . CORONARY ANGIOPLASTY WITH STENT PLACEMENT  07/15/2017   "2 stents"  . CORONARY/GRAFT ACUTE MI REVASCULARIZATION N/A 03/12/2019   Procedure: Coronary/Graft Acute MI Revascularization;  Surgeon: Nelva Bush, MD;  Location: [facility];  Service: Cardiovascular;  Laterality: N/A;  . LAPAROSCOPIC APPENDECTOMY N/A 11/12/2017   Procedure: APPENDECTOMY LAPAROSCOPIC, REPAIR OF INCARCERATED INCISIONAL HERNIA;  Surgeon: Michael Boston, MD;  Location: [facility];  Service: General;  Laterality: N/A;  . LAPAROSCOPIC  CHOLECYSTECTOMY    . LEFT HEART CATH AND CORONARY ANGIOGRAPHY N/A 07/15/2017   Procedure: LEFT HEART CATH AND CORONARY ANGIOGRAPHY;  Surgeon: Sherren Mocha, MD;  Location: [facility];  Service: Cardiovascular;  Laterality: N/A;  . LEFT HEART CATH AND CORONARY ANGIOGRAPHY N/A 03/12/2019   Procedure: LEFT HEART CATH AND CORONARY ANGIOGRAPHY;  Surgeon: Nelva Bush, MD;  Location: [facility];  Service: Cardiovascular;  Laterality: N/A;    Current Outpatient Medications  Medication Sig Dispense Refill  . ALPRAZolam (XANAX) 1 MG tablet Take 1 mg by mouth daily as needed for anxiety.    Marland Kitchen aspirin EC 81 MG tablet Take 1 tablet (81 mg total) by mouth daily with breakfast. 120 tablet 5  . atorvastatin (LIPITOR) 80 MG tablet TAKE 1 TABLET BY MOUTH EVERYDAY AT BEDTIME 90 tablet 1  . blood glucose meter kit and supplies KIT 1 each by Does not apply route 4 (four) times daily. Dispense based on patient and insurance preference. Use up to four times daily as directed. (FOR ICD-9 250.00, 250.01). 1 each 0  . BRILINTA 90 MG TABS tablet TAKE 1 TABLET (90 MG TOTAL) BY MOUTH 2 (TWO) TIMES DAILY. 180 tablet 0  . carvedilol (COREG) 3.125 MG tablet TAKE 1 TABLET (3.125 MG TOTAL) BY MOUTH 2 (TWO) TIMES DAILY WITH A MEAL. 180 tablet 1  . Continuous Blood Gluc Receiver (DEXCOM G6 RECEIVER) DEVI 1 Piece by Does not apply route as needed. 1 each 0  . Continuous Blood Gluc Sensor (DEXCOM  G6 SENSOR) MISC 4 Pieces by Does not apply route once a week. 4 each 2  . Continuous Blood Gluc Transmit (DEXCOM G6 TRANSMITTER) MISC 1 Piece by Does not apply route as directed. 1 each 1  . insulin glargine (LANTUS SOLOSTAR) 100 UNIT/ML Solostar Pen Inject 80 Units into the skin at bedtime. 72 mL 0  . insulin lispro (HUMALOG) 100 UNIT/ML KwikPen Inject 0.14-0.2 mLs (14-20 Units total) into the skin 3 (three) times daily with meals. 20 pen 1  . Insulin Pen Needle (PEN NEEDLES) 31G X 8 MM MISC 1 each by Does not apply  route 4 (four) times daily. 100 each 3  . losartan (COZAAR) 50 MG tablet TAKE 1 TABLET BY MOUTH EVERY DAY 90 tablet 1  . metFORMIN (GLUCOPHAGE-XR) 500 MG 24 hr tablet Take 1 tablet (500 mg total) by mouth 2 (two) times daily. 180 tablet 0  . nitroGLYCERIN (NITROSTAT) 0.4 MG SL tablet Place 1 tablet (0.4 mg total) under the tongue every 5 (five) minutes x 3 doses as needed for chest pain. 25 tablet 1  . Omega-3 1000 MG CAPS Take 1,000 mg by mouth 2 (two) times daily.     No current facility-administered medications for this visit.   Allergies:  Bee venom and Penicillins   ROS:  No palpitations or syncope.  Physical Exam: VS:  BP 130/70   Pulse 81   Ht '5\' 11"'  (1.803 m)   Wt 290 lb (131.5 kg)   SpO2 95%   BMI 40.45 kg/m , BMI Body mass index is 40.45 kg/m.  Wt Readings from Last 3 Encounters:  05/07/20 290 lb (131.5 kg)  05/02/20 (!) 290 lb (131.5 kg)  01/30/20 294 lb 12.8 oz (133.7 kg)    General: Patient appears comfortable at rest. HEENT: Conjunctiva and lids normal, wearing a mask. Neck: Supple, no elevated JVP or carotid bruits, no thyromegaly. Lungs: Clear to auscultation, nonlabored breathing at rest. Cardiac: Regular rate and rhythm, no S3 or significant systolic murmur, no pericardial rub. Extremities: No pitting edema, distal pulses 2+.  ECG:  An ECG dated 04/03/2019 was personally reviewed today and demonstrated:  SVT.  Recent Labwork: 04/30/2020: ALT 47; AST 30; BUN 12; Creat 0.91; Potassium 4.3; Sodium 138; TSH 1.45     Component Value Date/Time   CHOL 109 10/11/2019 0856   TRIG 185 (H) 10/11/2019 0856   HDL 20 (L) 10/11/2019 0856   CHOLHDL 5.5 (H) 10/11/2019 0856   VLDL 47 (H) 03/13/2019 0547   LDLCALC 63 10/11/2019 0856    Other Studies Reviewed Today:  Echocardiogram 07/26/2019: 1. Left ventricular ejection fraction, by visual estimation, is 65 to  70%. The left ventricle has hyperdynamic function. There is mildly  increased left ventricular posterior  wall hypertrophy.  2. Elevated left ventricular end-diastolic pressure.  3. Left ventricular diastolic Doppler parameters are consistent with  impaired relaxation pattern of LV diastolic filling.  4. Global right ventricle has normal systolic function.The right  ventricular size is normal. Right vetricular wall thickness was not  assessed.  5. Left atrial size was normal.  6. Right atrial size was normal.  7. The mitral valve is grossly normal. Mild mitral valve regurgitation.  8. The tricuspid valve is grossly normal. Tricuspid valve regurgitation  is trivial.  9. The aortic valve is tricuspid Aortic valve regurgitation was not  visualized by color flow Doppler. Mild aortic valve sclerosis without  stenosis.  10. The pulmonic valve was grossly normal. Pulmonic valve regurgitation is  not  visualized by color flow Doppler.  11. Normal pulmonary artery systolic pressure.  12. The inferior vena cava IVC is small suggestive of low RAP.   Assessment and Plan:  1.  CAD status post multiple PCI, most recently DES to the LAD and staged angioplasty of restenosed OM in June 2020.  He does not report any active angina at this time on medical therapy which includes aspirin, Brilinta, Lipitor, Coreg, and losartan.  ECG reviewed.  LVEF 65 to 70% by follow-up echocardiogram in October 2020.  2.  PSVT, likely AVRT.  He has only occasional palpitations at this point and is tolerating low-dose Coreg.  He did not tolerate Cardizem CD previously or higher dose Coreg.  3.  Mixed hyperlipidemia, last LDL 63.  Medication Adjustments/Labs and Tests Ordered: Current medicines are reviewed at length with the patient today.  Concerns regarding medicines are outlined above.   Tests Ordered: Orders Placed This Encounter  Procedures  . EKG 12-Lead    Medication Changes: No orders of the defined types were placed in this encounter.   Disposition:  Follow up 6 months in the [place]  office.  Signed, Satira Sark, MD, [facility] 05/07/2020 4:20 PM    [facility] [place], [place], [place] [address] Phone: [phone]; Fax: [phone]

## 2020-05-07 NOTE — Telephone Encounter (Signed)
Left a message requesting a return call to the office. 

## 2020-05-13 ENCOUNTER — Other Ambulatory Visit: Payer: Self-pay | Admitting: Cardiology

## 2020-06-15 DIAGNOSIS — Z006 Encounter for examination for normal comparison and control in clinical research program: Secondary | ICD-10-CM

## 2020-06-15 NOTE — Research (Addendum)
[employer] Research Study  3 Year Follow-up  Patient doing well at this time, no events to report or medication changes to report. Next follow-up will be in 1 year.   Current Outpatient Medications:    ALPRAZolam (XANAX) 1 MG tablet, Take 1 mg by mouth daily as needed for anxiety., Disp: , Rfl:    aspirin EC 81 MG tablet, Take 1 tablet (81 mg total) by mouth daily with breakfast., Disp: 120 tablet, Rfl: 5   atorvastatin (LIPITOR) 80 MG tablet, TAKE 1 TABLET BY MOUTH EVERYDAY AT BEDTIME, Disp: 90 tablet, Rfl: 1   blood glucose meter kit and supplies KIT, 1 each by Does not apply route 4 (four) times daily. Dispense based on patient and insurance preference. Use up to four times daily as directed. (FOR ICD-9 250.00, 250.01)., Disp: 1 each, Rfl: 0   BRILINTA 90 MG TABS tablet, TAKE 1 TABLET (90 MG TOTAL) BY MOUTH 2 (TWO) TIMES DAILY., Disp: 180 tablet, Rfl: 2   carvedilol (COREG) 3.125 MG tablet, TAKE 1 TABLET (3.125 MG TOTAL) BY MOUTH 2 (TWO) TIMES DAILY WITH A MEAL., Disp: 180 tablet, Rfl: 1   Continuous Blood Gluc Receiver (DEXCOM G6 RECEIVER) DEVI, 1 Piece by Does not apply route as needed., Disp: 1 each, Rfl: 0   Continuous Blood Gluc Sensor (DEXCOM G6 SENSOR) MISC, 4 Pieces by Does not apply route once a week., Disp: 4 each, Rfl: 2   Continuous Blood Gluc Transmit (DEXCOM G6 TRANSMITTER) MISC, 1 Piece by Does not apply route as directed., Disp: 1 each, Rfl: 1   insulin glargine (LANTUS SOLOSTAR) 100 UNIT/ML Solostar Pen, Inject 80 Units into the skin at bedtime., Disp: 72 mL, Rfl: 0   insulin lispro (HUMALOG) 100 UNIT/ML KwikPen, Inject 0.14-0.2 mLs (14-20 Units total) into the skin 3 (three) times daily with meals., Disp: 20 pen, Rfl: 1   Insulin Pen Needle (PEN NEEDLES) 31G X 8 MM MISC, 1 each by Does not apply route 4 (four) times daily., Disp: 100 each, Rfl: 3   losartan (COZAAR) 50 MG tablet, TAKE 1 TABLET BY MOUTH EVERY DAY, Disp: 90 tablet, Rfl: 1   metFORMIN (GLUCOPHAGE-XR) 500 MG 24  hr tablet, Take 1 tablet (500 mg total) by mouth 2 (two) times daily., Disp: 180 tablet, Rfl: 0   nitroGLYCERIN (NITROSTAT) 0.4 MG SL tablet, Place 1 tablet (0.4 mg total) under the tongue every 5 (five) minutes x 3 doses as needed for chest pain., Disp: 25 tablet, Rfl: 1   Omega-3 1000 MG CAPS, Take 1,000 mg by mouth 2 (two) times daily., Disp: , Rfl:

## 2020-07-13 ENCOUNTER — Other Ambulatory Visit: Payer: Self-pay

## 2020-07-13 DIAGNOSIS — E1165 Type 2 diabetes mellitus with hyperglycemia: Secondary | ICD-10-CM

## 2020-07-13 MED ORDER — INSULIN LISPRO (1 UNIT DIAL) 100 UNIT/ML (KWIKPEN)
14.0000 [IU] | PEN_INJECTOR | Freq: Three times a day (TID) | SUBCUTANEOUS | 1 refills | Status: DC
Start: 2020-07-13 — End: 2020-09-05

## 2020-08-06 ENCOUNTER — Telehealth: Payer: Self-pay | Admitting: "Endocrinology

## 2020-08-06 MED ORDER — LANTUS SOLOSTAR 100 UNIT/ML ~~LOC~~ SOPN: 80.0000 [IU] | PEN_INJECTOR | Freq: Every day | SUBCUTANEOUS | 1 refills | Status: DC

## 2020-08-06 NOTE — Telephone Encounter (Signed)
Rx sent 

## 2020-08-06 NOTE — Telephone Encounter (Signed)
Pt is calling and states he went to pick up his Lantus at the pharmacy and they only had it for 30 days and in order for insurance to help cover it it has to be 90 days.  [id number] - [place], [place] - [address] AT [address] Phone:  [phone]  Fax:  [phone]

## 2020-08-17 ENCOUNTER — Other Ambulatory Visit: Payer: Self-pay | Admitting: Cardiology

## 2020-09-04 ENCOUNTER — Other Ambulatory Visit: Payer: Self-pay | Admitting: Cardiology

## 2020-09-05 ENCOUNTER — Ambulatory Visit (INDEPENDENT_AMBULATORY_CARE_PROVIDER_SITE_OTHER): Payer: 59 | Admitting: "Endocrinology

## 2020-09-05 ENCOUNTER — Encounter: Payer: Self-pay | Admitting: "Endocrinology

## 2020-09-05 ENCOUNTER — Other Ambulatory Visit: Payer: Self-pay

## 2020-09-05 VITALS — BP 140/86 | HR 84 | Ht 71.0 in | Wt 287.0 lb

## 2020-09-05 DIAGNOSIS — E782 Mixed hyperlipidemia: Secondary | ICD-10-CM

## 2020-09-05 DIAGNOSIS — I10 Essential (primary) hypertension: Secondary | ICD-10-CM

## 2020-09-05 DIAGNOSIS — E1165 Type 2 diabetes mellitus with hyperglycemia: Secondary | ICD-10-CM | POA: Diagnosis not present

## 2020-09-05 LAB — HEMOGLOBIN A1C: Hemoglobin A1C: 11.9

## 2020-09-05 MED ORDER — FREESTYLE LIBRE 2 READER DEVI: 0 refills | Status: DC

## 2020-09-05 MED ORDER — INSULIN LISPRO (1 UNIT DIAL) 100 UNIT/ML (KWIKPEN): 20.0000 [IU] | PEN_INJECTOR | Freq: Three times a day (TID) | SUBCUTANEOUS | 1 refills | Status: DC

## 2020-09-05 MED ORDER — FREESTYLE LIBRE 2 SENSOR MISC: 1.0000 | 3 refills | Status: DC

## 2020-09-05 NOTE — Progress Notes (Signed)
09/05/2020    Endocrinology follow-up note    Subjective:    Patient ID: Lance [name], male   DOB: 1976/01/06.  Is being seen in follow-up for management of currently uncontrolled type 2 diabetes, complicated by coronary artery disease recently which required stent placement, hyperlipidemia, hypertension.   Past Medical History:  Diagnosis Date  . CAD (coronary artery disease), native coronary artery    10/18 PCI/DES to mRCA, and OM, with total occlusion of dLCx with collaterals.   . Essential hypertension   . GERD (gastroesophageal reflux disease)   . History of kidney stones   . Hyperlipemia   . SVT (supraventricular tachycardia) ([place]) 2015   Converted with adenosine  . Type 2 diabetes mellitus ([place])    Past Surgical History:  Procedure Laterality Date  . CARDIAC SURGERY    . CORONARY ANGIOPLASTY WITH STENT PLACEMENT  07/15/2017   "2 stents"  . CORONARY/GRAFT ACUTE MI REVASCULARIZATION N/A 03/12/2019   Procedure: Coronary/Graft Acute MI Revascularization;  Surgeon: Nelva Bush, MD;  Location: [facility];  Service: Cardiovascular;  Laterality: N/A;  . LAPAROSCOPIC APPENDECTOMY N/A 11/12/2017   Procedure: APPENDECTOMY LAPAROSCOPIC, REPAIR OF INCARCERATED INCISIONAL HERNIA;  Surgeon: Michael Boston, MD;  Location: [facility];  Service: General;  Laterality: N/A;  . LAPAROSCOPIC CHOLECYSTECTOMY    . LEFT HEART CATH AND CORONARY ANGIOGRAPHY N/A 07/15/2017   Procedure: LEFT HEART CATH AND CORONARY ANGIOGRAPHY;  Surgeon: Sherren Mocha, MD;  Location: [facility];  Service: Cardiovascular;  Laterality: N/A;  . LEFT HEART CATH AND CORONARY ANGIOGRAPHY N/A 03/12/2019   Procedure: LEFT HEART CATH AND CORONARY ANGIOGRAPHY;  Surgeon: Nelva Bush, MD;  Location: [facility];  Service: Cardiovascular;  Laterality: N/A;   Social History   Socioeconomic History  . Marital status: Married    Spouse name: Not on file  . Number of children: Not on file  . Years of  education: Not on file  . Highest education level: Not on file  Occupational History  . Occupation: Pharmacist, hospital: [employer]  Tobacco Use  . Smoking status: Light Tobacco Smoker    Packs/day: 0.50    Years: 26.00    Pack years: 13.00    Types: Cigarettes    Last attempt to quit: 03/07/2019    Years since quitting: 1.5  . Smokeless tobacco: Never Used  Vaping Use  . Vaping Use: Never used  Substance and Sexual Activity  . Alcohol use: No  . Drug use: No  . Sexual activity: Yes    Birth control/protection: None  Other Topics Concern  . Not on file  Social History Narrative  . Not on file   Social Determinants of Health   Financial Resource Strain:   . Difficulty of Paying Living Expenses: Not on file  Food Insecurity:   . Worried About Charity fundraiser in the Last Year: Not on file  . Ran Out of Food in the Last Year: Not on file  Transportation Needs:   . Lack of Transportation (Medical): Not on file  . Lack of Transportation (Non-Medical): Not on file  Physical Activity:   . Days of Exercise per Week: Not on file  . Minutes of Exercise per Session: Not on file  Stress:   . Feeling of Stress : Not on file  Social Connections:   . Frequency of Communication with Friends and Family: Not on file  . Frequency of Social Gatherings with Friends and Family: Not  on file  . Attends Religious Services: Not on file  . Active Member of Clubs or Organizations: Not on file  . Attends Archivist Meetings: Not on file  . Marital Status: Not on file   Outpatient Encounter Medications as of 09/05/2020  Medication Sig  . ALPRAZolam (XANAX) 1 MG tablet Take 1 mg by mouth daily as needed for anxiety.  Marland Kitchen aspirin EC 81 MG tablet Take 1 tablet (81 mg total) by mouth daily with breakfast.  . atorvastatin (LIPITOR) 80 MG tablet TAKE 1 TABLET BY MOUTH EVERYDAY AT BEDTIME  . blood glucose meter kit and supplies KIT 1 each by Does not apply route 4 (four) times  daily. Dispense based on patient and insurance preference. Use up to four times daily as directed. (FOR ICD-9 250.00, 250.01).  . BRILINTA 90 MG TABS tablet TAKE 1 TABLET (90 MG TOTAL) BY MOUTH 2 (TWO) TIMES DAILY.  . carvedilol (COREG) 3.125 MG tablet TAKE 1 TABLET (3.125 MG TOTAL) BY MOUTH 2 (TWO) TIMES DAILY WITH A MEAL.  Marland Kitchen Continuous Blood Gluc Receiver (FREESTYLE LIBRE 2 READER) DEVI As directed  . Continuous Blood Gluc Sensor (FREESTYLE LIBRE 2 SENSOR) MISC 1 Piece by Does not apply route every 14 (fourteen) days.  . insulin glargine (LANTUS SOLOSTAR) 100 UNIT/ML Solostar Pen Inject 80 Units into the skin at bedtime.  . insulin lispro (HUMALOG) 100 UNIT/ML KwikPen Inject 20-26 Units into the skin 3 (three) times daily with meals.  . Insulin Pen Needle (PEN NEEDLES) 31G X 8 MM MISC 1 each by Does not apply route 4 (four) times daily.  Marland Kitchen losartan (COZAAR) 50 MG tablet TAKE 1 TABLET BY MOUTH EVERY DAY  . metFORMIN (GLUCOPHAGE-XR) 500 MG 24 hr tablet Take 1 tablet (500 mg total) by mouth 2 (two) times daily.  . nitroGLYCERIN (NITROSTAT) 0.4 MG SL tablet Place 1 tablet (0.4 mg total) under the tongue every 5 (five) minutes x 3 doses as needed for chest pain.  . Omega-3 1000 MG CAPS Take 1,000 mg by mouth 2 (two) times daily.  . [DISCONTINUED] Continuous Blood Gluc Receiver (DEXCOM G6 RECEIVER) DEVI 1 Piece by Does not apply route as needed.  . [DISCONTINUED] Continuous Blood Gluc Sensor (DEXCOM G6 SENSOR) MISC 4 Pieces by Does not apply route once a week.  . [DISCONTINUED] Continuous Blood Gluc Transmit (DEXCOM G6 TRANSMITTER) MISC 1 Piece by Does not apply route as directed.  . [DISCONTINUED] insulin lispro (HUMALOG) 100 UNIT/ML KwikPen Inject 14-20 Units into the skin 3 (three) times daily with meals.   No facility-administered encounter medications on file as of 09/05/2020.   ALLERGIES: Allergies  Allergen Reactions  . Bee Venom Anaphylaxis and Swelling  . Penicillins Hives, Itching and  Rash    Has patient had a PCN reaction causing immediate rash, facial/tongue/throat swelling, SOB or lightheadedness with hypotension: No Has patient had a PCN reaction causing severe rash involving mucus membranes or skin necrosis: No Has patient had a PCN reaction that required hospitalization: No Has patient had a PCN reaction occurring within the last 10 years: No If all of the above answers are "NO", then may proceed with Cephalosporin use.    VACCINATION STATUS:  There is no immunization history on file for this patient.  Diabetes He presents for his follow-up diabetic visit. He has type 2 diabetes mellitus. Onset time: He was diagnosed at approximate age of 89 years. His disease course has been worsening (Was recently hospitalized due to appendicitis after 2 months  of antibiotic treatment.  He is status post appendectomy.  ). There are no hypoglycemic associated symptoms. Pertinent negatives for hypoglycemia include no confusion, headaches, pallor or seizures. Pertinent negatives for diabetes include no blurred vision, no chest pain, no fatigue, no polydipsia, no polyphagia, no polyuria and no weakness. There are no hypoglycemic complications. Symptoms are worsening. There are no diabetic complications. Risk factors for coronary artery disease include diabetes mellitus, dyslipidemia, family history, hypertension, male sex, obesity, tobacco exposure and sedentary lifestyle. Current diabetic treatment includes intensive insulin program (He is on Levemir 68 units BID and Humalog 10 units TIDAC.). His weight is fluctuating minimally. He is following a generally unhealthy diet. When asked about meal planning, he reported none. He has not had a previous visit with a dietitian. He rarely participates in exercise. His home blood glucose trend is increasing steadily. His breakfast blood glucose range is generally 180-200 mg/dl. His lunch blood glucose range is generally 180-200 mg/dl. His dinner blood  glucose range is generally 180-200 mg/dl. His bedtime blood glucose range is generally 180-200 mg/dl. His overall blood glucose range is 180-200 mg/dl. (He presents with his logs showing consistently above target glycemic profile.  His point-of-care A1c is 9.1% increasing from 8.4%.  ) An ACE inhibitor/angiotensin II receptor blocker is being taken. He does not see a podiatrist.Eye exam is not current.  Hyperlipidemia This is a chronic problem. The current episode started more than 1 year ago. The problem is controlled. Exacerbating diseases include diabetes and obesity. Pertinent negatives include no chest pain, myalgias or shortness of breath. Current antihyperlipidemic treatment includes statins and bile acid squestrants. The current treatment provides significant improvement of lipids. Risk factors for coronary artery disease include diabetes mellitus, dyslipidemia, hypertension, male sex, obesity and a sedentary lifestyle.  Hypertension This is a chronic problem. The current episode started more than 1 year ago. Pertinent negatives include no blurred vision, chest pain, headaches, neck pain, palpitations or shortness of breath. Risk factors for coronary artery disease include dyslipidemia, diabetes mellitus, male gender, smoking/tobacco exposure and sedentary lifestyle. Past treatments include ACE inhibitors.     Review of systems  Constitutional: + Minimally fluctuating body weight,  current  Body mass index is 40.03 kg/m. , no fatigue, no subjective hyperthermia, no subjective hypothermia Eyes: no blurry vision, no xerophthalmia ENT: no sore throat, no nodules palpated in throat, no dysphagia/odynophagia, no hoarseness Cardiovascular: no Chest Pain, no Shortness of Breath, no palpitations, no leg swelling Respiratory: no cough, no shortness of breath Gastrointestinal: no Nausea/Vomiting/Diarhhea Musculoskeletal: no muscle/joint aches Skin: no rashes, no hyperemia Neurological: no tremors,  no numbness, no tingling, no dizziness Psychiatric: no depression, no anxiety   Objective:    BP 140/86   Pulse 84   Ht 5' 11"  (1.803 m)   Wt 287 lb (130.2 kg)   BMI 40.03 kg/m   Wt Readings from Last 3 Encounters:  09/05/20 287 lb (130.2 kg)  05/07/20 290 lb (131.5 kg)  05/02/20 (!) 290 lb (131.5 kg)      Physical Exam- Limited  Constitutional:  Body mass index is 40.03 kg/m. , not in acute distress, normal state of mind Eyes:  EOMI, no exophthalmos Neck: Supple Thyroid: No gross goiter Respiratory: Adequate breathing efforts Musculoskeletal: no gross deformities, strength intact in all four extremities, no gross restriction of joint movements Skin:  no rashes, no hyperemia Neurological: no tremor with outstretched hands,   CMP ( most recent) CMP     Component Value Date/Time   NA  138 04/30/2020 0728   NA 141 07/07/2017 0837   K 4.3 04/30/2020 0728   CL 102 04/30/2020 0728   CO2 25 04/30/2020 0728   GLUCOSE 180 (H) 04/30/2020 0728   BUN 12 04/30/2020 0728   BUN 15 07/07/2017 0837   CREATININE 0.91 04/30/2020 0728   CALCIUM 9.3 04/30/2020 0728   PROT 6.7 04/30/2020 0728   ALBUMIN 3.7 04/04/2019 0208   AST 30 04/30/2020 0728   ALT 47 (H) 04/30/2020 0728   ALKPHOS 90 04/04/2019 0208   BILITOT 0.8 04/30/2020 0728   GFRNONAA 102 04/30/2020 0728   GFRAA 118 04/30/2020 0728     Diabetic Labs (most recent): Lab Results  Component Value Date   HGBA1C 9.1 (A) 05/02/2020   HGBA1C 8.4 (A) 01/30/2020   HGBA1C 7.8 (H) 10/11/2019    Lipid Panel     Component Value Date/Time   CHOL 109 10/11/2019 0856   TRIG 185 (H) 10/11/2019 0856   HDL 20 (L) 10/11/2019 0856   CHOLHDL 5.5 (H) 10/11/2019 0856   VLDL 47 (H) 03/13/2019 0547   LDLCALC 63 10/11/2019 0856     Assessment & Plan:   1. Uncontrolled type 2 diabetes mellitus with chronic kidney disease, coronary artery disease, with long-term current use of insulin.  - Patient has currently uncontrolled  symptomatic type 2 DM since  44 years of age. He presents with his logs showing consistently above target glycemic profile.  His point-of-care A1c is 9.1% increasing from 8.4%.    Recent labs reviewed. -his diabetes is complicated by obesity/sedentary life, chronic heavy smoking and GUINN DELAROSA remains at a high risk for more acute and chronic complications which include CAD, CVA, CKD, retinopathy, and neuropathy. These are all discussed in detail with the patient.  - I have counseled him on diet management and weight loss, by adopting a carbohydrate restricted/protein rich diet.  - he  admits there is a room for improvement in his diet and drink choices. -  Suggestion is made for him to avoid simple carbohydrates  from his diet including Cakes, Sweet Desserts / Pastries, Ice Cream, Soda (diet and regular), Sweet Tea, Candies, Chips, Cookies, Sweet Pastries,  Store Bought Juices, Alcohol in Excess of  1-2 drinks a day, Artificial Sweeteners, Coffee Creamer, and "Sugar-free" Products. This will help patient to have stable blood glucose profile and potentially avoid unintended weight gain.  - I encouraged him to switch to  unprocessed or minimally processed complex starch and increased protein intake (animal or plant source), fruits, and vegetables.  - he is advised to stick to a routine mealtimes to eat 3 meals  a day and avoid unnecessary snacks ( to snack only to correct hypoglycemia).   - I have approached him with the following individualized plan to manage diabetes and patient agrees:   -He will continue to require intensive treatment with basal/bolus insulin in order for him to achieve and maintain control of diabetes to target.   -He is approached to continue to monitor blood glucose 4 times a day-before meals and at bedtime.  He is advised to continue low Lantus 80 units nightly, increase Humalog to 20-26 units 3 times daily before meals, associated with strict monitoring of blood glucose  4 times a day-before meals and at bedtime .  -Patient is encouraged to call clinic for blood glucose levels less than 70 or above 200 mg /dl. -He is open to consider a CGM device this time.  I discussed and prescribed the  freestyle libre device for him.   -He is advised to continue with Metformin 500 mg XR p.o. twice daily. -He declined offer for weekly incretin injection therapy.  - Patient specific target  A1c;  LDL, HDL, Triglycerides,  were discussed in detail.  2) BP/HTN: -His blood pressure is controlled to target.   He is advised to continue  with his current blood pressure medications including lisinopril 40 mg p.o. daily along with his carvedilol 3.125 mg p.o. twice daily.    3) Lipids: His recent lipid panel showed controlled LDL of 63.    He is advised to continue  omega-3 fatty acids 1000 units p.o. twice daily, and Lipitor 80 mg p.o. nightly.      4)  Weight/Diet: His BMI is 40.41-LKGMWNU complicating his diabetes care.  He is a candidate for major weight loss.  CDE Consult has been initiated  , exercise, and detailed carbohydrates information provided.  He is an ideal candidate for bariatric surgery.  He declined bariatric surgery as an option for weight control.    5) Chronic Care/Health Maintenance:  -he  is on ACEI/ARB and Statin medications and  is encouraged to continue to follow up with Ophthalmology, Dentist,  Podiatrist at least yearly or according to recommendations, and advised to  quit smoking. I have recommended yearly flu vaccine and pneumonia vaccination at least every 5 years; moderate intensity exercise for up to 150 minutes weekly; and  sleep for at least 7 hours a day.  - I advised patient to maintain close follow up with Rosalee Kaufman, PA-C for primary care needs.  - Time spent on this patient care encounter:  35 min, of which > 50% was spent in  counseling and the rest reviewing his blood glucose logs , discussing his hypoglycemia and hyperglycemia  episodes, reviewing his current and  previous labs / studies  ( including abstraction from other facilities) and medications  doses and developing a  long term treatment plan and documenting his care.   Please refer to Patient Instructions for Blood Glucose Monitoring and Insulin/Medications Dosing Guide"  in media tab for additional information. Please  also refer to " Patient Self Inventory" in the Media  tab for reviewed elements of pertinent patient history.  Redmond Pulling participated in the discussions, expressed understanding, and voiced agreement with the above plans.  All questions were answered to his satisfaction. he is encouraged to contact clinic should he have any questions or concerns prior to his return visit.   Follow up plan: - Return in about 3 months (around 12/04/2020) for F/U with Pre-visit Labs, Meter, Logs, A1c here.Glade Lloyd, MD Phone: [phone]  Fax: [phone]  -  This note was partially dictated with voice recognition software. Similar sounding words can be transcribed inadequately or may not  be corrected upon review.  09/05/2020, 4:10 PM

## 2020-09-05 NOTE — Patient Instructions (Signed)

## 2020-10-07 ENCOUNTER — Other Ambulatory Visit: Payer: Self-pay | Admitting: "Endocrinology

## 2020-10-18 ENCOUNTER — Other Ambulatory Visit: Payer: Self-pay | Admitting: "Endocrinology

## 2020-10-18 ENCOUNTER — Telehealth: Payer: Self-pay

## 2020-10-18 ENCOUNTER — Other Ambulatory Visit: Payer: Self-pay

## 2020-10-18 DIAGNOSIS — E1165 Type 2 diabetes mellitus with hyperglycemia: Secondary | ICD-10-CM

## 2020-10-18 MED ORDER — NOVOLOG FLEXPEN 100 UNIT/ML ~~LOC~~ SOPN
20.0000 [IU] | PEN_INJECTOR | Freq: Three times a day (TID) | SUBCUTANEOUS | 2 refills | Status: DC
Start: 2020-10-18 — End: 2020-10-26

## 2020-10-18 MED ORDER — PEN NEEDLES 31G X 8 MM MISC: 1.0000 | Freq: Four times a day (QID) | 1 refills | Status: DC

## 2020-10-18 MED ORDER — INSULIN LISPRO (1 UNIT DIAL) 100 UNIT/ML (KWIKPEN): 20.0000 [IU] | PEN_INJECTOR | Freq: Three times a day (TID) | SUBCUTANEOUS | 1 refills | Status: DC

## 2020-10-18 NOTE — Telephone Encounter (Signed)
Returned call no answer, left VM for pt to return call

## 2020-10-18 NOTE — Telephone Encounter (Signed)
Pt left a VM requesting a call back regarding his insulin.

## 2020-10-18 NOTE — Telephone Encounter (Signed)
Spoke with pt sent in scripts

## 2020-10-26 MED ORDER — NOVOLOG FLEXPEN 100 UNIT/ML ~~LOC~~ SOPN: 20.0000 [IU] | PEN_INJECTOR | Freq: Three times a day (TID) | SUBCUTANEOUS | 0 refills | Status: DC

## 2020-10-26 MED ORDER — LANTUS SOLOSTAR 100 UNIT/ML ~~LOC~~ SOPN: 80.0000 [IU] | PEN_INJECTOR | Freq: Every day | SUBCUTANEOUS | 0 refills | Status: DC

## 2020-10-26 NOTE — Telephone Encounter (Signed)
Rx's sent in. °

## 2020-10-26 NOTE — Telephone Encounter (Signed)
Pt came into the office and states the wrong medication was called in.  He needs the below.   insulin aspart (NOVOLOG FLEXPEN) 100 UNIT/ML FlexPen   insulin glargine (LANTUS SOLOSTAR) 100 UNIT/ML Solostar Pen    [id number] - [place],  - [address] AT [address] Phone:  [phone]  Fax:  [phone]

## 2020-10-29 ENCOUNTER — Telehealth: Payer: Self-pay

## 2020-10-29 MED ORDER — INSULIN LISPRO (1 UNIT DIAL) 100 UNIT/ML (KWIKPEN): 20.0000 [IU] | PEN_INJECTOR | Freq: Three times a day (TID) | SUBCUTANEOUS | 0 refills | Status: DC

## 2020-10-29 NOTE — Telephone Encounter (Signed)
Please call patient- he said that he went to the pharmacy to get his 2 insulins. He said that he can not take novolog and that is what was called in. He said he has tried it 2x and it makes his sugar increase, he did not pick up either insulin  [phone]

## 2020-10-29 NOTE — Telephone Encounter (Signed)
Pt called requesting novolog be changed to humalog. States novolog does not control his blood glucose. Rx for humalog sent to pharmacy per Dr.Nida's orders.

## 2020-10-29 NOTE — Telephone Encounter (Signed)
Please help.

## 2020-10-30 ENCOUNTER — Other Ambulatory Visit: Payer: Self-pay

## 2020-11-02 ENCOUNTER — Other Ambulatory Visit: Payer: Self-pay

## 2020-11-02 MED ORDER — BASAGLAR KWIKPEN 100 UNIT/ML ~~LOC~~ SOPN: 80.0000 [IU] | PEN_INJECTOR | Freq: Every day | SUBCUTANEOUS | 0 refills | Status: DC

## 2020-11-06 ENCOUNTER — Other Ambulatory Visit: Payer: Self-pay | Admitting: "Endocrinology

## 2020-11-06 NOTE — Telephone Encounter (Signed)
Pt left a VM to receive a call back

## 2020-11-06 NOTE — Telephone Encounter (Signed)
Returned call to patient, scripts need to be for 90 days supply sent in to pharmacy

## 2020-11-06 NOTE — Telephone Encounter (Signed)
Patient stated tat he can only get a 18 day supply filled at pharmacy, they had tried to run it through for 30, 60 and 90 days, they  could only get it for 18 days, patient can't afford the co-pays for 18 days twice monthly with 2 different insulin scripts, Advised patient to check with insurance company to see if he has to do mail order or what the issue is. Pt will contact insurance and advise.

## 2020-11-06 NOTE — Telephone Encounter (Signed)
Patient requesting a call back

## 2020-11-13 ENCOUNTER — Ambulatory Visit (INDEPENDENT_AMBULATORY_CARE_PROVIDER_SITE_OTHER): Payer: 59 | Admitting: Cardiology

## 2020-11-13 ENCOUNTER — Other Ambulatory Visit: Payer: Self-pay

## 2020-11-13 ENCOUNTER — Encounter: Payer: Self-pay | Admitting: Cardiology

## 2020-11-13 VITALS — BP 106/58 | HR 80 | Ht 71.0 in | Wt 290.0 lb

## 2020-11-13 DIAGNOSIS — I25119 Atherosclerotic heart disease of native coronary artery with unspecified angina pectoris: Secondary | ICD-10-CM

## 2020-11-13 DIAGNOSIS — E782 Mixed hyperlipidemia: Secondary | ICD-10-CM | POA: Diagnosis not present

## 2020-11-13 DIAGNOSIS — I471 Supraventricular tachycardia, unspecified: Secondary | ICD-10-CM

## 2020-11-13 MED ORDER — TICAGRELOR 60 MG PO TABS: 60.0000 mg | ORAL_TABLET | Freq: Two times a day (BID) | ORAL | 6 refills | Status: DC

## 2020-11-13 NOTE — Progress Notes (Signed)
Cardiology Office Note  Date: 11/13/2020   ID: Lance [name], DOB 12-27-1975, MRN [id number]  PCP:  Lanelle Bal, PA-C  Cardiologist:  Rozann Lesches, MD Electrophysiologist:  None   Chief Complaint  Patient presents with  . Cardiac follow-up    History of Present Illness: Lance [name] is a 45 y.o. male last seen in August 2021.  He presents for a routine follow-up visit.  From a cardiac perspective, he does not report any active angina symptoms on medical therapy.  We went over his medications.  Plan to decrease Brilinta to 60 mg twice daily at this point.  He will have follow-up lab work in March.  Last LDL was 63 on Lipitor.  He does not report any recurrent palpitations and continues to tolerate low-dose Coreg.  Past Medical History:  Diagnosis Date  . CAD (coronary artery disease), native coronary artery    10/18 PCI/DES to mRCA, and OM, with total occlusion of dLCx with collaterals.   . Essential hypertension   . GERD (gastroesophageal reflux disease)   . History of kidney stones   . Hyperlipemia   . SVT (supraventricular tachycardia) ([place]) 2015   Converted with adenosine  . Type 2 diabetes mellitus ([place])     Past Surgical History:  Procedure Laterality Date  . CARDIAC SURGERY    . CORONARY ANGIOPLASTY WITH STENT PLACEMENT  07/15/2017   "2 stents"  . CORONARY/GRAFT ACUTE MI REVASCULARIZATION N/A 03/12/2019   Procedure: Coronary/Graft Acute MI Revascularization;  Surgeon: Nelva Bush, MD;  Location: [facility];  Service: Cardiovascular;  Laterality: N/A;  . LAPAROSCOPIC APPENDECTOMY N/A 11/12/2017   Procedure: APPENDECTOMY LAPAROSCOPIC, REPAIR OF INCARCERATED INCISIONAL HERNIA;  Surgeon: Michael Boston, MD;  Location: [facility];  Service: General;  Laterality: N/A;  . LAPAROSCOPIC CHOLECYSTECTOMY    . LEFT HEART CATH AND CORONARY ANGIOGRAPHY N/A 07/15/2017   Procedure: LEFT HEART CATH AND CORONARY ANGIOGRAPHY;  Surgeon: Sherren Mocha, MD;  Location: [facility];  Service: Cardiovascular;  Laterality: N/A;  . LEFT HEART CATH AND CORONARY ANGIOGRAPHY N/A 03/12/2019   Procedure: LEFT HEART CATH AND CORONARY ANGIOGRAPHY;  Surgeon: Nelva Bush, MD;  Location: [facility];  Service: Cardiovascular;  Laterality: N/A;    Current Outpatient Medications  Medication Sig Dispense Refill  . ALPRAZolam (XANAX) 1 MG tablet Take 1 mg by mouth daily as needed for anxiety.    Marland Kitchen aspirin EC 81 MG tablet Take 1 tablet (81 mg total) by mouth daily with breakfast. 120 tablet 5  . atorvastatin (LIPITOR) 80 MG tablet TAKE 1 TABLET BY MOUTH EVERYDAY AT BEDTIME 90 tablet 1  . blood glucose meter kit and supplies KIT 1 each by Does not apply route 4 (four) times daily. Dispense based on patient and insurance preference. Use up to four times daily as directed. (FOR ICD-9 250.00, 250.01). 1 each 0  . carvedilol (COREG) 3.125 MG tablet TAKE 1 TABLET (3.125 MG TOTAL) BY MOUTH 2 (TWO) TIMES DAILY WITH A MEAL. 180 tablet 1  . Continuous Blood Gluc Receiver (FREESTYLE LIBRE 2 READER) DEVI As directed 1 each 0  . Continuous Blood Gluc Sensor (FREESTYLE LIBRE 2 SENSOR) MISC 1 Piece by Does not apply route every 14 (fourteen) days. 2 each 3  . insulin aspart protamine- aspart (NOVOLOG MIX 70/30) (70-30) 100 UNIT/ML injection Inject into the skin. 20-26 units 3 times daily    . Insulin Glargine (BASAGLAR KWIKPEN) 100 UNIT/ML INJECT 80 UNITS INTO THE SKIN  AT BEDTIME. 75 mL 1  . Insulin Pen Needle (PEN NEEDLES) 31G X 8 MM MISC 1 each by Does not apply route 4 (four) times daily. 400 each 1  . losartan (COZAAR) 50 MG tablet TAKE 1 TABLET BY MOUTH EVERY DAY 90 tablet 1  . metFORMIN (GLUCOPHAGE-XR) 500 MG 24 hr tablet TAKE 1 TABLET BY MOUTH TWICE A DAY 180 tablet 0  . nitroGLYCERIN (NITROSTAT) 0.4 MG SL tablet Place 1 tablet (0.4 mg total) under the tongue every 5 (five) minutes x 3 doses as needed for chest pain. 25 tablet 1  . Omega-3 1000 MG CAPS Take 1,000 mg by  mouth 2 (two) times daily.    . ticagrelor (BRILINTA) 60 MG TABS tablet Take 1 tablet (60 mg total) by mouth 2 (two) times daily. 60 tablet 6   No current facility-administered medications for this visit.   Allergies:  Bee venom and Penicillins   ROS: No syncope.  Physical Exam: VS:  BP (!) 106/58   Pulse 80   Ht 5' 11"  (1.803 m)   Wt 290 lb (131.5 kg)   SpO2 98%   BMI 40.45 kg/m , BMI Body mass index is 40.45 kg/m.  Wt Readings from Last 3 Encounters:  11/13/20 290 lb (131.5 kg)  09/05/20 287 lb (130.2 kg)  05/07/20 290 lb (131.5 kg)    General: Patient appears comfortable at rest. HEENT: Conjunctiva and lids normal, wearing a mask. Lungs: Clear to auscultation, nonlabored breathing at rest. Cardiac: Regular rate and rhythm, no S3 or significant systolic murmur, no pericardial rub. Extremities: No pitting edema.  ECG:  An ECG dated 05/07/2020 was personally reviewed today and demonstrated:  Normal sinus rhythm.  Recent Labwork: 04/30/2020: ALT 47; AST 30; BUN 12; Creat 0.91; Potassium 4.3; Sodium 138; TSH 1.45     Component Value Date/Time   CHOL 109 10/11/2019 0856   TRIG 185 (H) 10/11/2019 0856   HDL 20 (L) 10/11/2019 0856   CHOLHDL 5.5 (H) 10/11/2019 0856   VLDL 47 (H) 03/13/2019 0547   LDLCALC 63 10/11/2019 0856    Other Studies Reviewed Today:  Echocardiogram 07/26/2019: 1. Left ventricular ejection fraction, by visual estimation, is 65 to  70%. The left ventricle has hyperdynamic function. There is mildly  increased left ventricular posterior wall hypertrophy.  2. Elevated left ventricular end-diastolic pressure.  3. Left ventricular diastolic Doppler parameters are consistent with  impaired relaxation pattern of LV diastolic filling.  4. Global right ventricle has normal systolic function.The right  ventricular size is normal. Right vetricular wall thickness was not  assessed.  5. Left atrial size was normal.  6. Right atrial size was normal.  7.  The mitral valve is grossly normal. Mild mitral valve regurgitation.  8. The tricuspid valve is grossly normal. Tricuspid valve regurgitation  is trivial.  9. The aortic valve is tricuspid Aortic valve regurgitation was not  visualized by color flow Doppler. Mild aortic valve sclerosis without  stenosis.  10. The pulmonic valve was grossly normal. Pulmonic valve regurgitation is  not visualized by color flow Doppler.  11. Normal pulmonary artery systolic pressure.  12. The inferior vena cava IVC is small suggestive of low RAP.   Assessment and Plan:  1.  CAD status post multiple percutaneous coronary interventions, most recently DES to the LAD and staged angioplasty of restenosed OM in June 2020.  Remains stable without recurrent anginal medical therapy.  Continue aspirin, Brilinta at 60 mg twice daily, Lipitor, losartan, and Coreg.  He has as needed nitroglycerin available.  2.  Mixed hyperlipidemia, tolerating Lipitor with last LDL 63.  He will have follow-up lab work in March.  3.  PSVT, AVRT suspected.  He did not tolerate Cardizem CD or higher dose Coreg, doing well on current regimen without significant palpitations.  Medication Adjustments/Labs and Tests Ordered: Current medicines are reviewed at length with the patient today.  Concerns regarding medicines are outlined above.   Tests Ordered: No orders of the defined types were placed in this encounter.   Medication Changes: Meds ordered this encounter  Medications  . ticagrelor (BRILINTA) 60 MG TABS tablet    Sig: Take 1 tablet (60 mg total) by mouth 2 (two) times daily.    Dispense:  60 tablet    Refill:  6    DOSE CHANGE 11/13/2020    Disposition:  Follow up 6 months in the [place] office.  Signed, Satira Sark, MD, [facility] 11/13/2020 4:36 PM    [facility] [place], [place], [place] [address] Phone: [phone]; Fax: [phone]

## 2020-11-13 NOTE — Patient Instructions (Signed)
Your physician wants you to follow-up in: 6 MONTHS WITH DR MCDOWELL You will receive a reminder letter in the mail two months in advance. If you don't receive a letter, please call our office to schedule the follow-up appointment.  Your physician has recommended you make the following change in your medication:   DECREASE BRILINTA 60 MG TWICE DAILY   Thank you for choosing [facility]!!

## 2020-12-06 ENCOUNTER — Ambulatory Visit: Payer: 59 | Admitting: "Endocrinology

## 2020-12-07 ENCOUNTER — Telehealth: Payer: Self-pay | Admitting: *Deleted

## 2020-12-07 NOTE — Telephone Encounter (Signed)
[facility]. Will advise patient that he can get this from [facility] or [facility].

## 2020-12-11 LAB — COMPREHENSIVE METABOLIC PANEL
ALT: 54 IU/L — ABNORMAL HIGH (ref 0–44)
AST: 32 IU/L (ref 0–40)
Albumin/Globulin Ratio: 1.8 (ref 1.2–2.2)
Albumin: 4.3 g/dL (ref 4.0–5.0)
Alkaline Phosphatase: 109 IU/L (ref 44–121)
BUN/Creatinine Ratio: 14 (ref 9–20)
BUN: 13 mg/dL (ref 6–24)
Bilirubin Total: 0.8 mg/dL (ref 0.0–1.2)
CO2: 23 mmol/L (ref 20–29)
Calcium: 9.5 mg/dL (ref 8.7–10.2)
Chloride: 100 mmol/L (ref 96–106)
Creatinine, Ser: 0.94 mg/dL (ref 0.76–1.27)
Globulin, Total: 2.4 g/dL (ref 1.5–4.5)
Glucose: 148 mg/dL — ABNORMAL HIGH (ref 65–99)
Potassium: 4.4 mmol/L (ref 3.5–5.2)
Sodium: 140 mmol/L (ref 134–144)
Total Protein: 6.7 g/dL (ref 6.0–8.5)
eGFR: 103 mL/min/{1.73_m2} (ref >59)

## 2020-12-11 LAB — LIPID PANEL
Chol/HDL Ratio: 5.8 ratio — ABNORMAL HIGH (ref 0.0–5.0)
Cholesterol, Total: 115 mg/dL (ref 100–199)
HDL: 20 mg/dL — ABNORMAL LOW (ref >39)
LDL Chol Calc (NIH): 50 mg/dL (ref 0–99)
Triglycerides: 287 mg/dL — ABNORMAL HIGH (ref 0–149)
VLDL Cholesterol Cal: 45 mg/dL — ABNORMAL HIGH (ref 5–40)

## 2020-12-12 ENCOUNTER — Telehealth: Payer: Self-pay | Admitting: Cardiology

## 2020-12-12 MED ORDER — LOSARTAN POTASSIUM 50 MG PO TABS: 50.0000 mg | ORAL_TABLET | Freq: Every day | ORAL | 1 refills | Status: DC

## 2020-12-12 NOTE — Telephone Encounter (Signed)
Advised that rx can be sent to another local pharmacy. Patient request losartan be sent to [facility]. 

## 2020-12-12 NOTE — Telephone Encounter (Signed)
Advised that rx can be sent to another local pharmacy. Patient request losartan be sent to [facility].

## 2020-12-12 NOTE — Telephone Encounter (Signed)
Patient returned Lydia's call. He said his insurance is [facility] [employer] & [facility] ONLY cover [facility]. He is currently out of the medication. Do we have samples to hold him over since his medication is on backorder. Please call him #[phone] to let him him know what to do.

## 2020-12-13 ENCOUNTER — Other Ambulatory Visit: Payer: Self-pay

## 2020-12-13 ENCOUNTER — Ambulatory Visit (INDEPENDENT_AMBULATORY_CARE_PROVIDER_SITE_OTHER): Payer: 59 | Admitting: "Endocrinology

## 2020-12-13 ENCOUNTER — Encounter: Payer: Self-pay | Admitting: "Endocrinology

## 2020-12-13 VITALS — BP 161/85 | HR 90 | Ht 71.0 in | Wt 291.6 lb

## 2020-12-13 DIAGNOSIS — E1165 Type 2 diabetes mellitus with hyperglycemia: Secondary | ICD-10-CM | POA: Diagnosis not present

## 2020-12-13 DIAGNOSIS — E782 Mixed hyperlipidemia: Secondary | ICD-10-CM | POA: Diagnosis not present

## 2020-12-13 DIAGNOSIS — I10 Essential (primary) hypertension: Secondary | ICD-10-CM | POA: Diagnosis not present

## 2020-12-13 LAB — POCT GLYCOSYLATED HEMOGLOBIN (HGB A1C): HbA1c, POC (controlled diabetic range): 10 % — AB (ref 0.0–7.0)

## 2020-12-13 MED ORDER — FREESTYLE LIBRE 2 READER DEVI: 0 refills | Status: DC

## 2020-12-13 MED ORDER — NOVOLOG FLEXPEN 100 UNIT/ML ~~LOC~~ SOPN: 22.0000 [IU] | PEN_INJECTOR | Freq: Three times a day (TID) | SUBCUTANEOUS | 2 refills | Status: DC

## 2020-12-13 MED ORDER — FREESTYLE LIBRE 2 SENSOR MISC: 1.0000 | 3 refills | Status: DC

## 2020-12-13 MED ORDER — GLIPIZIDE ER 5 MG PO TB24: 5.0000 mg | ORAL_TABLET | Freq: Every day | ORAL | 3 refills | Status: DC

## 2020-12-13 NOTE — Patient Instructions (Signed)
                                     Advice for Weight Management  -For most of us the best way to lose weight is by diet management. Generally speaking, diet management means consuming less calories intentionally which over time brings about progressive weight loss.  This can be achieved more effectively by restricting carbohydrate consumption to the minimum possible.  So, it is critically important to know your numbers: how much calorie you are consuming and how much calorie you need. More importantly, our carbohydrates sources should be unprocessed or minimally processed complex starch food items.   Sometimes, it is important to balance nutrition by increasing protein intake (animal or plant source), fruits, and vegetables.  -Sticking to a routine mealtime to eat 3 meals a day and avoiding unnecessary snacks is shown to have a big role in weight control. Under normal circumstances, the only time we lose real weight is when we are hungry, so allow hunger to take place- hunger means no food between meal times, only water.  It is not advisable to starve.   -It is better to avoid simple carbohydrates including: Cakes, Sweet Desserts, Ice Cream, Soda (diet and regular), Sweet Tea, Candies, Chips, Cookies, Store Bought Juices, Alcohol in Excess of  1-2 drinks a day, Lemonade,  Artificial Sweeteners, Doughnuts, Coffee Creamers, "Sugar-free" Products, etc, etc.  This is not a complete list.....    -Consulting with certified diabetes educators is proven to provide you with the most accurate and current information on diet.  Also, you may be  interested in discussing diet options/exchanges , we can schedule a visit with Lance [name], RDN, CDE for individualized nutrition education.  -Exercise: If you are able: 30 -60 minutes a day ,4 days a week, or 150 minutes a week.  The longer the better.  Combine stretch, strength, and aerobic activities.  If you were told in the  past that you have high risk for cardiovascular diseases, you may seek evaluation by your heart doctor prior to initiating moderate to intense exercise programs.                                  Additional Care Considerations for Diabetes   -Diabetes  is a chronic disease.  The most important care consideration is regular follow-up with your diabetes care provider with the goal being avoiding or delaying its complications and to take advantage of advances in medications and technology.    -Type 2 diabetes is known to coexist with other important comorbidities such as high blood pressure and high cholesterol.  It is critical to control not only the diabetes but also the high blood pressure and high cholesterol to minimize and delay the risk of complications including coronary artery disease, stroke, amputations, blindness, etc.    - Studies showed that people with diabetes will benefit from a class of medications known as ACE inhibitors and statins.  Unless there are specific reasons not to be on these medications, the standard of care is to consider getting one from these groups of medications at an optimal doses.  These medications are generally considered safe and proven to help protect the heart and the kidneys.    - People with diabetes are encouraged to initiate and maintain regular follow-up with eye doctors, foot   doctors, dentists , and if necessary heart and kidney doctors.     - It is highly recommended that people with diabetes quit smoking or stay away from smoking, and get yearly  flu vaccine and pneumonia vaccine at least every 5 years.  One other important lifestyle recommendation is to ensure adequate sleep - at least 6-7 hours of uninterrupted sleep at night.  -Exercise: If you are able: 30 -60 minutes a day, 4 days a week, or 150 minutes a week.  The longer the better.  Combine stretch, strength, and aerobic activities.  If you were told in the past that you have high risk for  cardiovascular diseases, you may seek evaluation by your heart doctor prior to initiating moderate to intense exercise programs.          

## 2020-12-13 NOTE — Progress Notes (Signed)
12/13/2020  Endocrinology follow-up note  Subjective:    Patient ID: Lance [name], male   DOB: [birth year].  Lance [name] is being seen in follow-up for management of currently uncontrolled type 2 diabetes, complicated by coronary artery disease recently which required stent placement, hyperlipidemia, hypertension.   Past Medical History:  Diagnosis Date  . CAD (coronary artery disease), native coronary artery    10/18 PCI/DES to mRCA, and OM, with total occlusion of dLCx with collaterals.   . Essential hypertension   . GERD (gastroesophageal reflux disease)   . History of kidney stones   . Hyperlipemia   . SVT (supraventricular tachycardia) ([place]) 2015   Converted with adenosine  . Type 2 diabetes mellitus ([place])    Past Surgical History:  Procedure Laterality Date  . CARDIAC SURGERY    . CORONARY ANGIOPLASTY WITH STENT PLACEMENT  07/15/2017   "2 stents"  . CORONARY/GRAFT ACUTE MI REVASCULARIZATION N/A 03/12/2019   Procedure: Coronary/Graft Acute MI Revascularization;  Surgeon: Lance Bush, MD;  Location: [facility];  Service: Cardiovascular;  Laterality: N/A;  . LAPAROSCOPIC APPENDECTOMY N/A 11/12/2017   Procedure: APPENDECTOMY LAPAROSCOPIC, REPAIR OF INCARCERATED INCISIONAL HERNIA;  Surgeon: Lance Boston, MD;  Location: [facility];  Service: General;  Laterality: N/A;  . LAPAROSCOPIC CHOLECYSTECTOMY    . LEFT HEART CATH AND CORONARY ANGIOGRAPHY N/A 07/15/2017   Procedure: LEFT HEART CATH AND CORONARY ANGIOGRAPHY;  Surgeon: Sherren Mocha, MD;  Location: [facility];  Service: Cardiovascular;  Laterality: N/A;  . LEFT HEART CATH AND CORONARY ANGIOGRAPHY N/A 03/12/2019   Procedure: LEFT HEART CATH AND CORONARY ANGIOGRAPHY;  Surgeon: Lance Bush, MD;  Location: [facility];  Service: Cardiovascular;  Laterality: N/A;   Social History   Socioeconomic History  . Marital status: Married    Spouse name: Not on file  . Number of children: Not on file  . Years of  education: Not on file  . Highest education level: Not on file  Occupational History  . Occupation: Pharmacist, hospital: [name]  Tobacco Use  . Smoking status: Light Tobacco Smoker    Packs/day: 0.50    Years: 26.00    Pack years: 13.00    Types: Cigarettes    Last attempt to quit: 03/07/2019    Years since quitting: 1.7  . Smokeless tobacco: Never Used  Vaping Use  . Vaping Use: Never used  Substance and Sexual Activity  . Alcohol use: No  . Drug use: No  . Sexual activity: Yes    Birth control/protection: None  Other Topics Concern  . Not on file  Social History Narrative  . Not on file   Social Determinants of Health   Financial Resource Strain: Not on file  Food Insecurity: Not on file  Transportation Needs: Not on file  Physical Activity: Not on file  Stress: Not on file  Social Connections: Not on file   Outpatient Encounter Medications as of 12/13/2020  Medication Sig  . glipiZIDE (GLUCOTROL XL) 5 MG 24 hr tablet Take 1 tablet (5 mg total) by mouth daily with breakfast.  . insulin aspart (NOVOLOG FLEXPEN) 100 UNIT/ML FlexPen Inject 22-28 Units into the skin 3 (three) times daily with meals.  . ALPRAZolam (XANAX) 1 MG tablet Take 1 mg by mouth daily as needed for anxiety.  Marland Kitchen aspirin EC 81 MG tablet Take 1 tablet (81 mg total) by mouth daily with breakfast.  . atorvastatin (LIPITOR) 80 MG tablet TAKE 1 TABLET BY  MOUTH EVERYDAY AT BEDTIME  . blood glucose meter kit and supplies KIT 1 each by Does not apply route 4 (four) times daily. Dispense based on patient and insurance preference. Use up to four times daily as directed. (FOR ICD-9 250.00, 250.01).  . carvedilol (COREG) 3.125 MG tablet TAKE 1 TABLET (3.125 MG TOTAL) BY MOUTH 2 (TWO) TIMES DAILY WITH A MEAL.  Marland Kitchen Continuous Blood Gluc Receiver (FREESTYLE LIBRE 2 READER) DEVI As directed  . Continuous Blood Gluc Sensor (FREESTYLE LIBRE 2 SENSOR) MISC 1 Piece by Does not apply route every 14 (fourteen)  days.  . Insulin Glargine (BASAGLAR KWIKPEN) 100 UNIT/ML INJECT 80 UNITS INTO THE SKIN AT BEDTIME.  . Insulin Pen Needle (PEN NEEDLES) 31G X 8 MM MISC 1 each by Does not apply route 4 (four) times daily.  Marland Kitchen losartan (COZAAR) 50 MG tablet Take 1 tablet (50 mg total) by mouth daily.  . metFORMIN (GLUCOPHAGE-XR) 500 MG 24 hr tablet TAKE 1 TABLET BY MOUTH TWICE A DAY  . nitroGLYCERIN (NITROSTAT) 0.4 MG SL tablet Place 1 tablet (0.4 mg total) under the tongue every 5 (five) minutes x 3 doses as needed for chest pain.  . Omega-3 1000 MG CAPS Take 1,000 mg by mouth 2 (two) times daily.  . ticagrelor (BRILINTA) 60 MG TABS tablet Take 1 tablet (60 mg total) by mouth 2 (two) times daily.  . [DISCONTINUED] Continuous Blood Gluc Receiver (FREESTYLE LIBRE 2 READER) DEVI As directed  . [DISCONTINUED] Continuous Blood Gluc Sensor (FREESTYLE LIBRE 2 SENSOR) MISC 1 Piece by Does not apply route every 14 (fourteen) days.  . [DISCONTINUED] insulin aspart protamine- aspart (NOVOLOG MIX 70/30) (70-30) 100 UNIT/ML injection Inject into the skin. 20-26 units 3 times daily   No facility-administered encounter medications on file as of 12/13/2020.   ALLERGIES: Allergies  Allergen Reactions  . Bee Venom Anaphylaxis and Swelling  . Penicillins Hives, Itching and Rash    Has patient had a PCN reaction causing immediate rash, facial/tongue/throat swelling, SOB or lightheadedness with hypotension: No Has patient had a PCN reaction causing severe rash involving mucus membranes or skin necrosis: No Has patient had a PCN reaction that required hospitalization: No Has patient had a PCN reaction occurring within the last 10 years: No If all of the above answers are "NO", then may proceed with Cephalosporin use.    VACCINATION STATUS:  There is no immunization history on file for this patient.  Diabetes He presents for his follow-up diabetic visit. He has type 2 diabetes mellitus. Onset time: He was diagnosed at  approximate age of 89 years. His disease course has been worsening (Was recently hospitalized due to appendicitis after 2 months of antibiotic treatment.  He is status post appendectomy.  ). There are no hypoglycemic associated symptoms. Pertinent negatives for hypoglycemia include no confusion, headaches, pallor or seizures. Pertinent negatives for diabetes include no blurred vision, no chest pain, no fatigue, no polydipsia, no polyphagia, no polyuria and no weakness. There are no hypoglycemic complications. Symptoms are improving. There are no diabetic complications. Risk factors for coronary artery disease include diabetes mellitus, dyslipidemia, family history, hypertension, male sex, obesity, tobacco exposure and sedentary lifestyle. Current diabetic treatment includes intensive insulin program (He is on Levemir 68 units BID and Humalog 10 units TIDAC.). His weight is fluctuating minimally. He is following a generally unhealthy diet. When asked about meal planning, he reported none. He has not had a previous visit with a dietitian. He rarely participates in exercise. His home  blood glucose trend is increasing steadily. His breakfast blood glucose range is generally 180-200 mg/dl. His lunch blood glucose range is generally >200 mg/dl. His dinner blood glucose range is generally >200 mg/dl. His bedtime blood glucose range is generally >200 mg/dl. His overall blood glucose range is >200 mg/dl. (He presents with still significantly above target glycemic profile.  His point-of-care A1c is 10%, improving from 11.9%.  No hypoglycemia.  He did not get his CGM prescription. ) An ACE inhibitor/angiotensin II receptor blocker is being taken. He does not see a podiatrist.Eye exam is not current.  Hyperlipidemia This is a chronic problem. The current episode started more than 1 year ago. The problem is controlled. Exacerbating diseases include diabetes and obesity. Pertinent negatives include no chest pain, myalgias or  shortness of breath. Current antihyperlipidemic treatment includes statins and bile acid squestrants. The current treatment provides significant improvement of lipids. Risk factors for coronary artery disease include diabetes mellitus, dyslipidemia, hypertension, male sex, obesity and a sedentary lifestyle.  Hypertension This is a chronic problem. The current episode started more than 1 year ago. Pertinent negatives include no blurred vision, chest pain, headaches, neck pain, palpitations or shortness of breath. Risk factors for coronary artery disease include dyslipidemia, diabetes mellitus, male gender, smoking/tobacco exposure and sedentary lifestyle. Past treatments include ACE inhibitors.   Review of systems  Constitutional: + Minimally fluctuating body weight,  current  Body mass index is 40.67 kg/m. , no fatigue, no subjective hyperthermia, no subjective hypothermia    Objective:    BP (!) 161/85   Pulse 90   Ht _0  (1.803 m)   Wt 291 lb 9.6 oz (132.3 kg)   BMI 40.67 kg/m   Wt Readings from Last 3 Encounters:  12/13/20 291 lb 9.6 oz (132.3 kg)  11/13/20 290 lb (131.5 kg)  09/05/20 287 lb (130.2 kg)      Physical Exam- Limited  Constitutional:  Body mass index is 40.67 kg/m. , not in acute distress  CMP ( most recent) CMP     Component Value Date/Time   NA 140 12/10/2020 0802   K 4.4 12/10/2020 0802   CL 100 12/10/2020 0802   CO2 23 12/10/2020 0802   GLUCOSE 148 (H) 12/10/2020 0802   GLUCOSE 180 (H) 04/30/2020 0728   BUN 13 12/10/2020 0802   CREATININE 0.94 12/10/2020 0802   CREATININE 0.91 04/30/2020 0728   CALCIUM 9.5 12/10/2020 0802   PROT 6.7 12/10/2020 0802   ALBUMIN 4.3 12/10/2020 0802   AST 32 12/10/2020 0802   ALT 54 (H) 12/10/2020 0802   ALKPHOS 109 12/10/2020 0802   BILITOT 0.8 12/10/2020 0802   GFRNONAA 102 04/30/2020 0728   GFRAA 118 04/30/2020 0728     Diabetic Labs (most recent): Lab Results  Component Value Date   HGBA1C 10.0 (A)  12/13/2020   HGBA1C 11.9 09/05/2020   HGBA1C 9.1 (A) 05/02/2020    Lipid Panel     Component Value Date/Time   CHOL 115 12/10/2020 0802   TRIG 287 (H) 12/10/2020 0802   HDL 20 (L) 12/10/2020 0802   CHOLHDL 5.8 (H) 12/10/2020 0802   CHOLHDL 5.5 (H) 10/11/2019 0856   VLDL 47 (H) 03/13/2019 0547   LDLCALC 50 12/10/2020 0802   LDLCALC 63 10/11/2019 0856     Assessment & Plan:   1. Uncontrolled type 2 diabetes mellitus with chronic kidney disease, coronary artery disease, with long-term current use of insulin.  He presents with still significantly above target glycemic profile.  His point-of-care A1c is 10%, improving from 11.9%.  No hypoglycemia.  He did not get his CGM prescription.   Recent labs reviewed. -his diabetes is complicated by obesity/sedentary life, chronic heavy smoking and KAIDYN JAVID remains at a high risk for more acute and chronic complications which include CAD, CVA, CKD, retinopathy, and neuropathy. These are all discussed in detail with the patient.  - I have counseled him on diet management and weight loss, by adopting a carbohydrate restricted/protein rich diet.  - he acknowledges that there is a room for improvement in his food and drink choices. - Suggestion is made for him to avoid simple carbohydrates  from his diet including Cakes, Sweet Desserts, Ice Cream, Soda (diet and regular), Sweet Tea, Candies, Chips, Cookies, Store Bought Juices, Alcohol in Excess of  1-2 drinks a day, Artificial Sweeteners,  Coffee Creamer, and "Sugar-free" Products, Lemonade. This will help patient to have more stable blood glucose profile and potentially avoid unintended weight gain.   - I encouraged him to switch to  unprocessed or minimally processed complex starch and increased protein intake (animal or plant source), fruits, and vegetables.  - he is advised to stick to a routine mealtimes to eat 3 meals  a day and avoid unnecessary snacks ( to snack only to correct  hypoglycemia).   - I have approached him with the following individualized plan to manage diabetes and patient agrees:   -He will continue to require intensive treatment with basal/bolus insulin in order for him to achieve and maintain control of diabetes to target.   -He is approached to continue to monitor blood glucose 4 times a day-before meals and at bedtime.  I discussed and prescribed the freestyle libre device for him.  This patient would benefit from this device tremendously.   -He is advised to continue Basaglar 80 units nightly, increase Humalog to 22-28  units 3 times daily before meals, associated with strict monitoring of blood glucose 4 times a day-before meals and at bedtime .  -Patient is encouraged to call clinic for blood glucose levels less than 70 or above 200 mg /dl. -He is open to consider a CGM device this time.  I discussed and prescribed the freestyle libre device for him.   -He is advised to continue with Metformin 500 mg XR p.o. twice daily. -He declined offer for weekly incretin injection therapy. -He may benefit from glipizide intervention.  I discussed and added glipizide 5 mg XL p.o. daily at breakfast.  - Patient specific target  A1c;  LDL, HDL, Triglycerides,  were discussed in detail.  2) BP/HTN: -His blood pressure is not controlled to target.   He is advised to continue  with his current blood pressure medications including lisinopril 40 mg p.o. daily along with his carvedilol 3.125 mg p.o. twice daily.    3) Lipids: His recent lipid panel showed controlled LDL of 63.    He is advised to continue  omega-3 fatty acids 1000 units p.o. twice daily, and Lipitor 80 mg p.o. nightly.      4)  Weight/Diet: His BMI is 85.63-JSHFWYO complicating his diabetes care.  He is a candidate for major weight loss.  CDE Consult has been initiated  , exercise, and detailed carbohydrates information provided.  He is an ideal candidate for bariatric surgery.  He declined  bariatric surgery as an option for weight control.    5) Chronic Care/Health Maintenance:  -he  is on ACEI/ARB and Statin medications and  is  encouraged to continue to follow up with Ophthalmology, Dentist,  Podiatrist at least yearly or according to recommendations, and advised to  quit smoking. I have recommended yearly flu vaccine and pneumonia vaccination at least every 5 years; moderate intensity exercise for up to 150 minutes weekly; and  sleep for at least 7 hours a day.  - I advised patient to maintain close follow up with Lanelle Bal, PA-C for primary care needs.  - Time spent on this patient care encounter:  40 min, of which > 50% was spent in  counseling and the rest reviewing his blood glucose logs , discussing his hypoglycemia and hyperglycemia episodes, reviewing his current and  previous labs / studies  ( including abstraction from other facilities) and medications  doses and developing a  long term treatment plan and documenting his care.   Please refer to Patient Instructions for Blood Glucose Monitoring and Insulin/Medications Dosing Guide"  in media tab for additional information. Please  also refer to " Patient Self Inventory" in the Media  tab for reviewed elements of pertinent patient history.  Redmond Pulling participated in the discussions, expressed understanding, and voiced agreement with the above plans.  All questions were answered to his satisfaction. he is encouraged to contact clinic should he have any questions or concerns prior to his return visit.   Follow up plan: - Return in about 3 months (around 03/15/2021) for Bring Meter and Logs- A1c in Office.  Glade Lloyd, MD Phone: [phone]  Fax: [phone]  -  This note was partially dictated with voice recognition software. Similar sounding words can be transcribed inadequately or may not  be corrected upon review.  12/13/2020, 5:38 PM

## 2020-12-20 ENCOUNTER — Ambulatory Visit (INDEPENDENT_AMBULATORY_CARE_PROVIDER_SITE_OTHER): Payer: 59 | Admitting: "Endocrinology

## 2020-12-20 ENCOUNTER — Other Ambulatory Visit: Payer: Self-pay

## 2020-12-20 DIAGNOSIS — E1165 Type 2 diabetes mellitus with hyperglycemia: Secondary | ICD-10-CM

## 2020-12-20 NOTE — Progress Notes (Signed)
Patient came in today for [facility] device and sensor application and set up, patient was instructed and demonstrated application of sensor and set up of [place] reader. Answered all questions, patient verbalized understanding.

## 2021-01-02 ENCOUNTER — Other Ambulatory Visit: Payer: Self-pay | Admitting: "Endocrinology

## 2021-02-08 ENCOUNTER — Other Ambulatory Visit: Payer: Self-pay

## 2021-02-08 ENCOUNTER — Encounter (HOSPITAL_COMMUNITY): Payer: Self-pay | Admitting: Emergency Medicine

## 2021-02-08 ENCOUNTER — Emergency Department (HOSPITAL_COMMUNITY)
Admission: EM | Admit: 2021-02-08 | Discharge: 2021-02-08 | Disposition: A | Discharge status: Home / Self Care | Payer: 59 | Attending: Emergency Medicine | Admitting: Emergency Medicine

## 2021-02-08 ENCOUNTER — Emergency Department (HOSPITAL_COMMUNITY): Payer: 59

## 2021-02-08 DIAGNOSIS — Z794 Long term (current) use of insulin: Secondary | ICD-10-CM | POA: Insufficient documentation

## 2021-02-08 DIAGNOSIS — E119 Type 2 diabetes mellitus without complications: Secondary | ICD-10-CM | POA: Diagnosis not present

## 2021-02-08 DIAGNOSIS — R0602 Shortness of breath: Secondary | ICD-10-CM | POA: Diagnosis not present

## 2021-02-08 DIAGNOSIS — Z7982 Long term (current) use of aspirin: Secondary | ICD-10-CM | POA: Insufficient documentation

## 2021-02-08 DIAGNOSIS — Z79899 Other long term (current) drug therapy: Secondary | ICD-10-CM | POA: Insufficient documentation

## 2021-02-08 DIAGNOSIS — R002 Palpitations: Secondary | ICD-10-CM | POA: Insufficient documentation

## 2021-02-08 DIAGNOSIS — R079 Chest pain, unspecified: Secondary | ICD-10-CM | POA: Diagnosis not present

## 2021-02-08 DIAGNOSIS — I471 Supraventricular tachycardia: Secondary | ICD-10-CM

## 2021-02-08 DIAGNOSIS — Z7984 Long term (current) use of oral hypoglycemic drugs: Secondary | ICD-10-CM | POA: Insufficient documentation

## 2021-02-08 DIAGNOSIS — Z955 Presence of coronary angioplasty implant and graft: Secondary | ICD-10-CM | POA: Diagnosis not present

## 2021-02-08 DIAGNOSIS — R000 Tachycardia, unspecified: Secondary | ICD-10-CM | POA: Diagnosis not present

## 2021-02-08 DIAGNOSIS — I2510 Atherosclerotic heart disease of native coronary artery without angina pectoris: Secondary | ICD-10-CM | POA: Insufficient documentation

## 2021-02-08 DIAGNOSIS — I252 Old myocardial infarction: Secondary | ICD-10-CM | POA: Insufficient documentation

## 2021-02-08 DIAGNOSIS — F17210 Nicotine dependence, cigarettes, uncomplicated: Secondary | ICD-10-CM | POA: Insufficient documentation

## 2021-02-08 DIAGNOSIS — I10 Essential (primary) hypertension: Secondary | ICD-10-CM | POA: Diagnosis not present

## 2021-02-08 LAB — CBC WITH DIFFERENTIAL/PLATELET
Abs Immature Granulocytes: 0.03 10*3/uL (ref 0.00–0.07)
Basophils Absolute: 0.1 10*3/uL (ref 0.0–0.1)
Basophils Relative: 1 %
Eosinophils Absolute: 0.5 10*3/uL (ref 0.0–0.5)
Eosinophils Relative: 4 %
HCT: 48.4 % (ref 39.0–52.0)
Hemoglobin: 15.7 g/dL (ref 13.0–17.0)
Immature Granulocytes: 0 %
Lymphocytes Relative: 40 %
Lymphs Abs: 4.9 10*3/uL — ABNORMAL HIGH (ref 0.7–4.0)
MCH: 28.5 pg (ref 26.0–34.0)
MCHC: 32.4 g/dL (ref 30.0–36.0)
MCV: 88 fL (ref 80.0–100.0)
Monocytes Absolute: 1.2 10*3/uL — ABNORMAL HIGH (ref 0.1–1.0)
Monocytes Relative: 10 %
Neutro Abs: 5.5 10*3/uL (ref 1.7–7.7)
Neutrophils Relative %: 45 %
Platelets: 252 10*3/uL (ref 150–400)
RBC: 5.5 MIL/uL (ref 4.22–5.81)
RDW: 13.8 % (ref 11.5–15.5)
WBC: 12.2 10*3/uL — ABNORMAL HIGH (ref 4.0–10.5)
nRBC: 0 % (ref 0.0–0.2)

## 2021-02-08 LAB — LIPASE, BLOOD: Lipase: 26 U/L (ref 11–51)

## 2021-02-08 LAB — COMPREHENSIVE METABOLIC PANEL
ALT: 53 U/L — ABNORMAL HIGH (ref 0–44)
AST: 32 U/L (ref 15–41)
Albumin: 4 g/dL (ref 3.5–5.0)
Alkaline Phosphatase: 98 U/L (ref 38–126)
Anion gap: 7 (ref 5–15)
BUN: 14 mg/dL (ref 6–20)
CO2: 27 mmol/L (ref 22–32)
Calcium: 9 mg/dL (ref 8.9–10.3)
Chloride: 102 mmol/L (ref 98–111)
Creatinine, Ser: 0.97 mg/dL (ref 0.61–1.24)
GFR, Estimated: >60 mL/min (ref >60)
Glucose, Bld: 186 mg/dL — ABNORMAL HIGH (ref 70–99)
Potassium: 4 mmol/L (ref 3.5–5.1)
Sodium: 136 mmol/L (ref 135–145)
Total Bilirubin: 1.1 mg/dL (ref 0.3–1.2)
Total Protein: 7.3 g/dL (ref 6.5–8.1)

## 2021-02-08 LAB — BRAIN NATRIURETIC PEPTIDE: B Natriuretic Peptide: 22 pg/mL (ref 0.0–100.0)

## 2021-02-08 LAB — TROPONIN I (HIGH SENSITIVITY)
Troponin I (High Sensitivity): 20 ng/L — ABNORMAL HIGH (ref <18)
Troponin I (High Sensitivity): 39 ng/L — ABNORMAL HIGH (ref <18)

## 2021-02-08 MED ORDER — ASPIRIN 81 MG PO CHEW: 324.0000 mg | CHEWABLE_TABLET | Freq: Once | ORAL | Status: AC

## 2021-02-08 MED ORDER — DILTIAZEM HCL-DEXTROSE 125-5 MG/125ML-% IV SOLN (PREMIX): 5.0000 mg/h | INTRAVENOUS | Status: DC

## 2021-02-08 MED ORDER — ASPIRIN 81 MG PO CHEW
CHEWABLE_TABLET | ORAL | Status: AC
  Administered 2021-02-08: 324 mg via ORAL
  Filled 2021-02-08: qty 4

## 2021-02-08 MED ORDER — DILTIAZEM LOAD VIA INFUSION
10.0000 mg | Freq: Once | INTRAVENOUS | Status: DC
  Filled 2021-02-08: qty 10

## 2021-02-08 MED ORDER — METOPROLOL TARTRATE 5 MG/5ML IV SOLN
INTRAVENOUS | Status: AC
  Administered 2021-02-08: 5 mg via INTRAVENOUS
  Filled 2021-02-08: qty 5

## 2021-02-08 MED ORDER — ADENOSINE 6 MG/2ML IV SOLN: 6.0000 mg | Freq: Once | INTRAVENOUS | Status: AC

## 2021-02-08 MED ORDER — METOPROLOL TARTRATE 5 MG/5ML IV SOLN: 5.0000 mg | Freq: Once | INTRAVENOUS | Status: AC

## 2021-02-08 MED ORDER — ADENOSINE 6 MG/2ML IV SOLN
INTRAVENOUS | Status: AC
  Administered 2021-02-08: 6 mg via INTRAVENOUS
  Filled 2021-02-08: qty 4

## 2021-02-08 NOTE — ED Provider Notes (Signed)
[facility] Provider Note   CSN: [id number] Arrival date & time: 02/08/21  0531     History Chief Complaint  Patient presents with  . Chest Pain    Lance [name] is a 45 y.o. male.  With history of CAD status post stents, status post V. fib arrest in June 2020, SVT, hypertension, diabetes presenting with palpitations and right-sided chest pain.  States he woke up with the pain about 430 this morning with a tightness and squeezing to the right side of his chest.  He was not sure if it was heartburn or related to his heart and feels similar to his previous heart attack.  Some associated shortness of breath.  No nausea or vomiting.  No diaphoresis.  Came in in a rapid rhythm at 142 beats a minute and normal sinus rhythm.  Denies feeling palpitations.  States compliance with his medications.  Did take his Coreg last night.  No blood thinners. 45 year old male patient with history of PCI to the circumflex/OM and RCA in 2018, hypertension, hyperlipidemia, type II DM, obesity.  Patient had STEMI with VF arrest 03/12/2019 treated with successful defibrillation and DES to the LAD with distal emboli treated with transient Aggrastat.  Also underwent PTCA on restenosed OM from 90 down to 60% EF 40 to 45%.  If patient has refractory chest pain complex bifurcation intervention to previously stented circumflex/OM1 and AV groove circumflex could be considered."   The history is provided by the patient. The history is limited by the condition of the patient.  Chest Pain Associated symptoms: palpitations and shortness of breath   Associated symptoms: no abdominal pain, no dizziness, no fever, no headache, no nausea, no vomiting and no weakness        Past Medical History:  Diagnosis Date  . CAD (coronary artery disease), native coronary artery    10/18 PCI/DES to mRCA, and OM, with total occlusion of dLCx with collaterals.   . Essential hypertension   . GERD  (gastroesophageal reflux disease)   . History of kidney stones   . Hyperlipemia   . SVT (supraventricular tachycardia) ([place]) 2015   Converted with adenosine  . Type 2 diabetes mellitus [facility])     Patient Active Problem List   Diagnosis Date Noted  . SVT (supraventricular tachycardia) ([place]) 04/20/2019  . Ischemic cardiomyopathy 04/20/2019  . Atrial tachycardia ([place]) 04/03/2019  . Acute ST elevation myocardial infarction (STEMI) involving left main coronary artery ([place])   . VF (ventricular fibrillation) ([place])   . Loud snoring   . STEMI (ST elevation myocardial infarction) ([place]) 03/12/2019  . STEMI involving left anterior descending coronary artery ([place]) 03/12/2019  . Incarcerated incisional hernia s/p redeuction/primary repair 11/12/2017 11/12/2017  . Chronic anticoagulation (Plavix) 11/12/2017  . Chronic appendicitis s/p lap appendectomy 11/12/2017 09/05/2017  . S/P drug eluting coronary stent placement   . CAD (coronary artery disease) 07/15/2017  . Severe obesity with body mass index (BMI) of 35.0 to 39.9 with comorbidity ([place]) 05/20/2017  . Family history of early CAD 12/29/2016  . Type 2 diabetes mellitus, uncontrolled ([place]) 08/31/2016  . Mixed hyperlipidemia 08/31/2016  . Tobacco abuse 08/31/2016  . Essential hypertension, benign 04/21/2013    Past Surgical History:  Procedure Laterality Date  . CARDIAC SURGERY    . CORONARY ANGIOPLASTY WITH STENT PLACEMENT  07/15/2017   "2 stents"  . CORONARY/GRAFT ACUTE MI REVASCULARIZATION N/A 03/12/2019   Procedure: Coronary/Graft Acute MI Revascularization;  Surgeon: Nelva Bush, MD;  Location: [facility];  Service: Cardiovascular;  Laterality: N/A;  . LAPAROSCOPIC APPENDECTOMY N/A 11/12/2017   Procedure: APPENDECTOMY LAPAROSCOPIC, REPAIR OF INCARCERATED INCISIONAL HERNIA;  Surgeon: Michael Boston, MD;  Location: [facility];  Service: General;  Laterality: N/A;  . LAPAROSCOPIC CHOLECYSTECTOMY    . LEFT HEART CATH AND CORONARY  ANGIOGRAPHY N/A 07/15/2017   Procedure: LEFT HEART CATH AND CORONARY ANGIOGRAPHY;  Surgeon: Sherren Mocha, MD;  Location: [facility];  Service: Cardiovascular;  Laterality: N/A;  . LEFT HEART CATH AND CORONARY ANGIOGRAPHY N/A 03/12/2019   Procedure: LEFT HEART CATH AND CORONARY ANGIOGRAPHY;  Surgeon: Nelva Bush, MD;  Location: [facility];  Service: Cardiovascular;  Laterality: N/A;       Family History  Problem Relation Age of Onset  . Heart disease Mother 41       CABG  . Diabetes Mother   . Gout Mother   . Heart attack Father 15    Social History   Tobacco Use  . Smoking status: Light Tobacco Smoker    Packs/day: 0.50    Years: 26.00    Pack years: 13.00    Types: Cigarettes    Last attempt to quit: 03/07/2019    Years since quitting: 1.9  . Smokeless tobacco: Never Used  Vaping Use  . Vaping Use: Never used  Substance Use Topics  . Alcohol use: No  . Drug use: No    Home Medications Prior to Admission medications   Medication Sig Start Date End Date Taking? Authorizing Provider  ALPRAZolam Duanne Moron) 1 MG tablet Take 1 mg by mouth daily as needed for anxiety.    [provider]  aspirin EC 81 MG tablet Take 1 tablet (81 mg total) by mouth daily with breakfast. 04/05/19   Denton Brick, Courage, MD  atorvastatin (LIPITOR) 80 MG tablet TAKE 1 TABLET BY MOUTH EVERYDAY AT BEDTIME 09/04/20   Satira Sark, MD  blood glucose meter kit and supplies KIT 1 each by Does not apply route 4 (four) times daily. Dispense based on patient and insurance preference. Use up to four times daily as directed. (FOR ICD-9 250.00, 250.01). 02/14/20   Cassandria Anger, MD  carvedilol (COREG) 3.125 MG tablet TAKE 1 TABLET (3.125 MG TOTAL) BY MOUTH 2 (TWO) TIMES DAILY WITH A MEAL. 08/17/20   Satira Sark, MD  Continuous Blood Gluc Receiver (FREESTYLE LIBRE 2 READER) DEVI As directed 12/13/20   Cassandria Anger, MD  Continuous Blood Gluc Sensor (FREESTYLE LIBRE  2 SENSOR) MISC 1 Piece by Does not apply route every 14 (fourteen) days. 12/13/20   Cassandria Anger, MD  glipiZIDE (GLUCOTROL XL) 5 MG 24 hr tablet Take 1 tablet (5 mg total) by mouth daily with breakfast. 12/13/20   Nida, Marella Chimes, MD  insulin aspart (NOVOLOG FLEXPEN) 100 UNIT/ML FlexPen Inject 22-28 Units into the skin 3 (three) times daily with meals. 12/13/20   Cassandria Anger, MD  Insulin Glargine (BASAGLAR KWIKPEN) 100 UNIT/ML INJECT 80 UNITS INTO THE SKIN AT BEDTIME. 11/06/20   Nida, Marella Chimes, MD  Insulin Pen Needle (PEN NEEDLES) 31G X 8 MM MISC 1 each by Does not apply route 4 (four) times daily. 10/18/20   Cassandria Anger, MD  losartan (COZAAR) 50 MG tablet Take 1 tablet (50 mg total) by mouth daily. 12/12/20   Satira Sark, MD  metFORMIN (GLUCOPHAGE-XR) 500 MG 24 hr tablet TAKE 1 TABLET BY MOUTH TWICE A DAY 01/02/21   Loni Beckwith  W, MD  nitroGLYCERIN (NITROSTAT) 0.4 MG SL tablet Place 1 tablet (0.4 mg total) under the tongue every 5 (five) minutes x 3 doses as needed for chest pain. 03/16/19   Cheryln Manly, NP  Omega-3 1000 MG CAPS Take 1,000 mg by mouth 2 (two) times daily.    [provider]  ticagrelor (BRILINTA) 60 MG TABS tablet Take 1 tablet (60 mg total) by mouth 2 (two) times daily. 11/13/20   Satira Sark, MD    Allergies    Bee venom and Penicillins  Review of Systems   Review of Systems  Constitutional: Negative for activity change, appetite change and fever.  HENT: Negative for congestion.   Respiratory: Positive for chest tightness and shortness of breath.   Cardiovascular: Positive for chest pain and palpitations.  Gastrointestinal: Negative for abdominal pain, nausea and vomiting.  Genitourinary: Negative for dysuria, hematuria and testicular pain.  Musculoskeletal: Negative for arthralgias and myalgias.  Skin: Negative for rash.  Neurological: Negative for dizziness, weakness and headaches.   all other systems  are negative except as noted in the HPI and PMH.    Physical Exam Updated Vital Signs BP (!) 155/111 (BP Location: Left Arm)   Pulse (!) 142   Temp 97.7 F (36.5 C) (Oral)   Resp 16   Ht 5' 11" (1.803 m)   Wt 133.8 kg   SpO2 99%   BMI 41.14 kg/m   Physical Exam Vitals and nursing note reviewed.  Constitutional:      General: He is in acute distress.     Appearance: He is well-developed. He is obese.  HENT:     Head: Normocephalic and atraumatic.     Mouth/Throat:     Pharynx: No oropharyngeal exudate.  Eyes:     Conjunctiva/sclera: Conjunctivae normal.     Pupils: Pupils are equal, round, and reactive to light.  Neck:     Comments: No meningismus. Cardiovascular:     Rate and Rhythm: Regular rhythm. Tachycardia present.     Heart sounds: Normal heart sounds. No murmur heard.   Pulmonary:     Effort: Pulmonary effort is normal. No respiratory distress.     Breath sounds: Normal breath sounds.  Abdominal:     Palpations: Abdomen is soft.     Tenderness: There is no abdominal tenderness. There is no guarding or rebound.  Musculoskeletal:        General: No tenderness. Normal range of motion.     Cervical back: Normal range of motion and neck supple.  Skin:    General: Skin is warm.  Neurological:     Mental Status: He is alert and oriented to person, place, and time.     Cranial Nerves: No cranial nerve deficit.     Motor: No abnormal muscle tone.     Coordination: Coordination normal.     Comments: No ataxia on finger to nose bilaterally. No pronator drift. 5/5 strength throughout. CN 2-12 intact.Equal grip strength. Sensation intact.   Psychiatric:        Behavior: Behavior normal.     ED Results / Procedures / Treatments   Labs (all labs ordered are listed, but only abnormal results are displayed) Labs Reviewed  CBC WITH DIFFERENTIAL/PLATELET - Abnormal; Notable for the following components:      Result Value   WBC 12.2 (*)    Lymphs Abs 4.9 (*)     Monocytes Absolute 1.2 (*)    All other components within normal limits  COMPREHENSIVE METABOLIC PANEL - Abnormal; Notable for the following components:   Glucose, Bld 186 (*)    ALT 53 (*)    All other components within normal limits  TROPONIN I (HIGH SENSITIVITY) - Abnormal; Notable for the following components:   Troponin I (High Sensitivity) 20 (*)    All other components within normal limits  LIPASE, BLOOD  BRAIN NATRIURETIC PEPTIDE    EKG EKG Interpretation  Date/Time:  Friday Feb 08 2021 06:03:12 EDT Ventricular Rate:  85 PR Interval:  189 QRS Duration: 91 QT Interval:  374 QTC Calculation: 445 R Axis:   100 Text Interpretation: Sinus rhythm Right axis deviation nowsinus Confirmed by Ezequiel Essex [phone]) on 02/08/2021 6:08:38 AM   Radiology DG Chest Portable 1 View  Result Date: 02/08/2021 CLINICAL DATA:  Chest pain EXAM: PORTABLE CHEST 1 VIEW COMPARISON:  Radiograph 03/26/2019, CT 07/10/2017 FINDINGS: Some chronically coarsened bronchitic changes are similar to prior. Mild pulmonary vascular chest without frank edema. Prominent cardiomediastinal silhouette is similar to comparison priors. No pneumothorax or effusion. No acute osseous or soft tissue abnormality. Telemetry leads overlie the chest. IMPRESSION: No acute cardiopulmonary abnormality. Chronically coarsened bronchitic changes. Stable cardiomegaly. Electronically Signed   By: Lovena Le M.D.   On: 02/08/2021 06:27    Procedures .Cardioversion  Date/Time: 02/08/2021 6:51 AM Performed by: Ezequiel Essex, MD Authorized by: Ezequiel Essex, MD   Consent:    Consent obtained:  Emergent situation and verbal   Consent given by:  Patient   Alternatives discussed:  Observation and rate-control medication Pre-procedure details:    Cardioversion basis:  Emergent   Rhythm:  Supraventricular tachycardia Patient sedated: No Attempt one:    Shock outcome:  Conversion to normal sinus rhythm Post-procedure  details:    Patient status:  Awake   Patient tolerance of procedure:  Tolerated well, no immediate complications Comments:     Chemical conversion with adenosine and metoprolol.    .Critical Care Performed by: Ezequiel Essex, MD Authorized by: Ezequiel Essex, MD   Critical care provider statement:    Critical care time (minutes):  45   Critical care was necessary to treat or prevent imminent or life-threatening deterioration of the following conditions: atrial tachycardia.   Critical care was time spent personally by me on the following activities:  Discussions with consultants, evaluation of patient's response to treatment, examination of patient, ordering and performing treatments and interventions, ordering and review of laboratory studies, ordering and review of radiographic studies, pulse oximetry, re-evaluation of patient's condition, obtaining history from patient or surrogate and review of old charts     Medications Ordered in ED Medications  aspirin chewable tablet 324 mg (has no administration in time range)  metoprolol tartrate (LOPRESSOR) injection 5 mg (has no administration in time range)  adenosine (ADENOCARD) 6 MG/2ML injection 6 mg (has no administration in time range)  diltiazem (CARDIZEM) 1 mg/mL load via infusion 10 mg (has no administration in time range)    And  diltiazem (CARDIZEM) 125 mg in dextrose 5% 125 mL (1 mg/mL) infusion (has no administration in time range)    ED Course  I have reviewed the triage vital signs and the nursing notes.  Pertinent labs & imaging results that were available during my care of the patient were reviewed by me and considered in my medical decision making (see chart for details).    MDM Rules/Calculators/A&P  Extensive cardiac history here with palpitations and right-sided chest pain.  Initial EKG is rapid atrial tachycardia with rates of 140s.  Diffuse ST depressions.  EKG shows rapid atrial  tachycardia with rates in the 140s.  Diffuse ST depressions.  Patient with right-sided chest pain which he states is atypical for his MI type pain.  Does have some shortness of breath and nausea.  Attempted 5 mg of IV metoprolol without success.  Patient converted to sinus rhythm after 6 mg of adenosine.  Repeat EKG shows no ST elevation  Successfully cardioverted with adenosine as above.  Patient feels back to baseline.  Labs are reassuring.  Troponin minimally elevated at 20.  Will continue to observe patient in the ED for recurrent arrhythmias.  Plan to have cardiology evaluation when they arrive this morning around 8 AM.  Disposition per cardiology.  Dr. Eulis Foster to assume care.   Final Clinical Impression(s) / ED Diagnoses Final diagnoses:  None    Rx / DC Orders ED Discharge Orders    None       Rancour, Annie Main, MD 02/08/21 [phone]

## 2021-02-08 NOTE — Progress Notes (Signed)
Patient discussed with Dr Effie Shy. Presented with SVT (has prior history) converted with adensosine. Mild trop in setting of tachycarrhythmia, his postconversion ekg has no significant ischemic changes, no further workup from troponin standpoint. From notes has not tolerated cardizem or higher dose coreg, plans have been for EP evaluation if recurrent issues with SVT, we will arrange an outpatient EP appointment. Ok for discharge from ER, could take additional coreg 3.125mg  prn for palpitations in the interim   Dina Rich MD

## 2021-02-08 NOTE — Progress Notes (Signed)
Referral to EP for SVT per Dr. Wyline Mood.

## 2021-02-08 NOTE — ED Provider Notes (Signed)
7:30 AM-checkout from Dr. Manus Gunning to evaluate patient for cardiac stability, based on delta troponin.  Consider cardiac consultation, prior to hospital discharge.  Patient has known cardiac disease, status post stenting, x2, with residual disease, possibly requiring intervention in the future.  He presented today with rapid tachycardia, recurrent, controlled with adenosine today.  He is chronically on carvedilol.  He was last seen by his cardiologist in February 2022.  At that time it was noted that he could not tolerate Cardizem, or higher doses of carvedilol.  10:45 AM-telephone consultation completed with cardiology who is reviewed his notes and arrange for follow-up care.  Dr. Wyline Mood has referred the patient to electrophysiology for evaluation of recurrent SVT.  He recommends using as needed carvedilol 3.125 mg, for episodes of tachycardia.  At this time, vital signs are stable.  Patient states he has been comfortable ever since he got the adenosine injection earlier.  Findings and plan discussed with patient and wife at bedside, all questions were answered.   Mancel Bale, MD 02/08/21 1057

## 2021-02-08 NOTE — Discharge Instructions (Addendum)
Your cardiologist office has referred you to an electrophysiologist, which is a specialized cardiologist to evaluate your periods of supraventricular tachycardia.  You can contact the office, for information about when that appointment can be scheduled.  Continue taking your medicines as usual.  You can use a third dose of carvedilol, if you feel like you are having elevated heart rate.  You can return here if needed for problems at any time.

## 2021-02-08 NOTE — ED Triage Notes (Signed)
Pt woke up this morning with right sided chest pain. Pt has cardiac history so he decided to come by evaluated.

## 2021-02-13 ENCOUNTER — Other Ambulatory Visit: Payer: Self-pay | Admitting: Cardiology

## 2021-02-26 ENCOUNTER — Other Ambulatory Visit: Payer: Self-pay

## 2021-02-26 DIAGNOSIS — E1165 Type 2 diabetes mellitus with hyperglycemia: Secondary | ICD-10-CM

## 2021-02-26 MED ORDER — NOVOLOG FLEXPEN 100 UNIT/ML ~~LOC~~ SOPN: 22.0000 [IU] | PEN_INJECTOR | Freq: Three times a day (TID) | SUBCUTANEOUS | 0 refills | Status: DC

## 2021-03-04 ENCOUNTER — Other Ambulatory Visit: Payer: Self-pay | Admitting: Cardiology

## 2021-03-09 ENCOUNTER — Emergency Department (HOSPITAL_COMMUNITY): Payer: 59

## 2021-03-09 ENCOUNTER — Encounter (HOSPITAL_COMMUNITY): Payer: Self-pay | Admitting: Emergency Medicine

## 2021-03-09 ENCOUNTER — Emergency Department (HOSPITAL_COMMUNITY)
Admission: EM | Admit: 2021-03-09 | Discharge: 2021-03-09 | Disposition: A | Discharge status: Home / Self Care | Payer: 59 | Attending: Emergency Medicine | Admitting: Emergency Medicine

## 2021-03-09 ENCOUNTER — Other Ambulatory Visit: Payer: Self-pay

## 2021-03-09 DIAGNOSIS — E119 Type 2 diabetes mellitus without complications: Secondary | ICD-10-CM | POA: Insufficient documentation

## 2021-03-09 DIAGNOSIS — Z79899 Other long term (current) drug therapy: Secondary | ICD-10-CM | POA: Insufficient documentation

## 2021-03-09 DIAGNOSIS — F17210 Nicotine dependence, cigarettes, uncomplicated: Secondary | ICD-10-CM | POA: Diagnosis not present

## 2021-03-09 DIAGNOSIS — Z7982 Long term (current) use of aspirin: Secondary | ICD-10-CM | POA: Diagnosis not present

## 2021-03-09 DIAGNOSIS — Z955 Presence of coronary angioplasty implant and graft: Secondary | ICD-10-CM | POA: Diagnosis not present

## 2021-03-09 DIAGNOSIS — Z7984 Long term (current) use of oral hypoglycemic drugs: Secondary | ICD-10-CM | POA: Diagnosis not present

## 2021-03-09 DIAGNOSIS — R000 Tachycardia, unspecified: Secondary | ICD-10-CM | POA: Insufficient documentation

## 2021-03-09 DIAGNOSIS — R002 Palpitations: Secondary | ICD-10-CM | POA: Diagnosis not present

## 2021-03-09 DIAGNOSIS — I10 Essential (primary) hypertension: Secondary | ICD-10-CM | POA: Insufficient documentation

## 2021-03-09 DIAGNOSIS — I2510 Atherosclerotic heart disease of native coronary artery without angina pectoris: Secondary | ICD-10-CM | POA: Insufficient documentation

## 2021-03-09 DIAGNOSIS — R079 Chest pain, unspecified: Secondary | ICD-10-CM | POA: Diagnosis not present

## 2021-03-09 DIAGNOSIS — Z794 Long term (current) use of insulin: Secondary | ICD-10-CM | POA: Insufficient documentation

## 2021-03-09 LAB — COMPREHENSIVE METABOLIC PANEL
ALT: 46 U/L — ABNORMAL HIGH (ref 0–44)
AST: 34 U/L (ref 15–41)
Albumin: 3.9 g/dL (ref 3.5–5.0)
Alkaline Phosphatase: 96 U/L (ref 38–126)
Anion gap: 8 (ref 5–15)
BUN: 14 mg/dL (ref 6–20)
CO2: 25 mmol/L (ref 22–32)
Calcium: 9 mg/dL (ref 8.9–10.3)
Chloride: 103 mmol/L (ref 98–111)
Creatinine, Ser: 1.1 mg/dL (ref 0.61–1.24)
GFR, Estimated: >60 mL/min (ref >60)
Glucose, Bld: 234 mg/dL — ABNORMAL HIGH (ref 70–99)
Potassium: 3.7 mmol/L (ref 3.5–5.1)
Sodium: 136 mmol/L (ref 135–145)
Total Bilirubin: 1 mg/dL (ref 0.3–1.2)
Total Protein: 7.1 g/dL (ref 6.5–8.1)

## 2021-03-09 LAB — CBC WITH DIFFERENTIAL/PLATELET
Abs Immature Granulocytes: 0.05 10*3/uL (ref 0.00–0.07)
Basophils Absolute: 0.1 10*3/uL (ref 0.0–0.1)
Basophils Relative: 1 %
Eosinophils Absolute: 0.4 10*3/uL (ref 0.0–0.5)
Eosinophils Relative: 3 %
HCT: 43.1 % (ref 39.0–52.0)
Hemoglobin: 14.3 g/dL (ref 13.0–17.0)
Immature Granulocytes: 0 %
Lymphocytes Relative: 34 %
Lymphs Abs: 5 10*3/uL — ABNORMAL HIGH (ref 0.7–4.0)
MCH: 29.1 pg (ref 26.0–34.0)
MCHC: 33.2 g/dL (ref 30.0–36.0)
MCV: 87.6 fL (ref 80.0–100.0)
Monocytes Absolute: 1.1 10*3/uL — ABNORMAL HIGH (ref 0.1–1.0)
Monocytes Relative: 8 %
Neutro Abs: 8.3 10*3/uL — ABNORMAL HIGH (ref 1.7–7.7)
Neutrophils Relative %: 54 %
Platelets: 262 10*3/uL (ref 150–400)
RBC: 4.92 MIL/uL (ref 4.22–5.81)
RDW: 13.6 % (ref 11.5–15.5)
WBC: 15 10*3/uL — ABNORMAL HIGH (ref 4.0–10.5)
nRBC: 0 % (ref 0.0–0.2)

## 2021-03-09 LAB — TROPONIN I (HIGH SENSITIVITY)
Troponin I (High Sensitivity): 17 ng/L (ref <18)
Troponin I (High Sensitivity): 34 ng/L — ABNORMAL HIGH (ref <18)

## 2021-03-09 MED ORDER — ADENOSINE 6 MG/2ML IV SOLN
INTRAVENOUS | Status: AC
  Filled 2021-03-09: qty 2

## 2021-03-09 NOTE — Discharge Instructions (Addendum)
Follow-up with Dr. Diona Browner next week..  Return if any problem

## 2021-03-09 NOTE — ED Triage Notes (Addendum)
Pt c/o chest pain that started about 45 mins PTA. Pt arrives in SVT Hr 181 in triage.

## 2021-03-09 NOTE — ED Provider Notes (Signed)
[facility] EMERGENCY DEPARTMENT Provider Note   CSN: [id number] Arrival date & time: 03/09/21  1744     History Chief Complaint  Patient presents with  . SVT  . Chest Pain    Lance [name] is a 45 y.o. male.  Patient complains of palpitations.  Patient has had SVT several times and has converted with adenosine each time.  Patient does not complain of any chest pain or shortness of breath  The history is provided by the patient and medical records. No language interpreter was used.  Palpitations Palpitations quality:  Regular Onset quality:  Sudden Timing:  Constant Progression:  Unchanged Chronicity:  Recurrent Context: not anxiety   Relieved by:  Nothing Worsened by:  Nothing Ineffective treatments:  None tried Associated symptoms: chest pain   Associated symptoms: no back pain and no cough        Past Medical History:  Diagnosis Date  . CAD (coronary artery disease), native coronary artery    10/18 PCI/DES to mRCA, and OM, with total occlusion of dLCx with collaterals.   . Essential hypertension   . GERD (gastroesophageal reflux disease)   . History of kidney stones   . Hyperlipemia   . SVT (supraventricular tachycardia) ([place]) 2015   Converted with adenosine  . Type 2 diabetes mellitus [facility])     Patient Active Problem List   Diagnosis Date Noted  . SVT (supraventricular tachycardia) ([place]) 04/20/2019  . Ischemic cardiomyopathy 04/20/2019  . Atrial tachycardia ([place]) 04/03/2019  . Acute ST elevation myocardial infarction (STEMI) involving left main coronary artery ([place])   . VF (ventricular fibrillation) ([place])   . Loud snoring   . STEMI (ST elevation myocardial infarction) ([place]) 03/12/2019  . STEMI involving left anterior descending coronary artery ([place]) 03/12/2019  . Incarcerated incisional hernia s/p redeuction/primary repair 11/12/2017 11/12/2017  . Chronic anticoagulation (Plavix) 11/12/2017  . Chronic appendicitis s/p lap appendectomy 11/12/2017 09/05/2017   . S/P drug eluting coronary stent placement   . CAD (coronary artery disease) 07/15/2017  . Severe obesity with body mass index (BMI) of 35.0 to 39.9 with comorbidity ([place]) 05/20/2017  . Family history of early CAD 12/29/2016  . Type 2 diabetes mellitus, uncontrolled ([place]) 08/31/2016  . Mixed hyperlipidemia 08/31/2016  . Tobacco abuse 08/31/2016  . Essential hypertension, benign 04/21/2013    Past Surgical History:  Procedure Laterality Date  . CARDIAC SURGERY    . CORONARY ANGIOPLASTY WITH STENT PLACEMENT  07/15/2017   "2 stents"  . CORONARY/GRAFT ACUTE MI REVASCULARIZATION N/A 03/12/2019   Procedure: Coronary/Graft Acute MI Revascularization;  Surgeon: Nelva Bush, MD;  Location: [facility];  Service: Cardiovascular;  Laterality: N/A;  . LAPAROSCOPIC APPENDECTOMY N/A 11/12/2017   Procedure: APPENDECTOMY LAPAROSCOPIC, REPAIR OF INCARCERATED INCISIONAL HERNIA;  Surgeon: Michael Boston, MD;  Location: [facility];  Service: General;  Laterality: N/A;  . LAPAROSCOPIC CHOLECYSTECTOMY    . LEFT HEART CATH AND CORONARY ANGIOGRAPHY N/A 07/15/2017   Procedure: LEFT HEART CATH AND CORONARY ANGIOGRAPHY;  Surgeon: Sherren Mocha, MD;  Location: [place] [facility];  Service: Cardiovascular;  Laterality: N/A;  . LEFT HEART CATH AND CORONARY ANGIOGRAPHY N/A 03/12/2019   Procedure: LEFT HEART CATH AND CORONARY ANGIOGRAPHY;  Surgeon: Nelva Bush, MD;  Location: [place] [facility];  Service: Cardiovascular;  Laterality: N/A;       Family History  Problem Relation Age of Onset  . Heart disease Mother 93       CABG  . Diabetes Mother   .  Gout Mother   . Heart attack Father 58    Social History   Tobacco Use  . Smoking status: Light Tobacco Smoker    Packs/day: 0.50    Years: 26.00    Pack years: 13.00    Types: Cigarettes    Last attempt to quit: 03/07/2019    Years since quitting: 2.0  . Smokeless tobacco: Never Used  Vaping Use  . Vaping Use: Never used  Substance Use Topics   . Alcohol use: No  . Drug use: No    Home Medications Prior to Admission medications   Medication Sig Start Date End Date Taking? Authorizing Provider  ALPRAZolam Duanne Moron) 1 MG tablet Take 1 mg by mouth daily as needed for anxiety.    [provider]  aspirin EC 81 MG tablet Take 1 tablet (81 mg total) by mouth daily with breakfast. 04/05/19   Denton Brick, Courage, MD  atorvastatin (LIPITOR) 80 MG tablet TAKE 1 TABLET BY MOUTH EVERYDAY AT BEDTIME 03/05/21   Satira Sark, MD  blood glucose meter kit and supplies KIT 1 each by Does not apply route 4 (four) times daily. Dispense based on patient and insurance preference. Use up to four times daily as directed. (FOR ICD-9 250.00, 250.01). 02/14/20   Cassandria Anger, MD  carvedilol (COREG) 3.125 MG tablet TAKE 1 TABLET (3.125 MG TOTAL) BY MOUTH 2 (TWO) TIMES DAILY WITH A MEAL. 02/13/21   Satira Sark, MD  Continuous Blood Gluc Receiver (FREESTYLE LIBRE 2 READER) DEVI As directed 12/13/20   Cassandria Anger, MD  Continuous Blood Gluc Sensor (FREESTYLE LIBRE 2 SENSOR) MISC 1 Piece by Does not apply route every 14 (fourteen) days. 12/13/20   Cassandria Anger, MD  glipiZIDE (GLUCOTROL XL) 5 MG 24 hr tablet Take 1 tablet (5 mg total) by mouth daily with breakfast. 12/13/20   Nida, Marella Chimes, MD  insulin aspart (NOVOLOG FLEXPEN) 100 UNIT/ML FlexPen Inject 22-28 Units into the skin 3 (three) times daily with meals. 02/26/21   Cassandria Anger, MD  Insulin Glargine (BASAGLAR KWIKPEN) 100 UNIT/ML INJECT 80 UNITS INTO THE SKIN AT BEDTIME. 11/06/20   Nida, Marella Chimes, MD  Insulin Pen Needle (PEN NEEDLES) 31G X 8 MM MISC 1 each by Does not apply route 4 (four) times daily. 10/18/20   Cassandria Anger, MD  losartan (COZAAR) 50 MG tablet Take 1 tablet (50 mg total) by mouth daily. 12/12/20   Satira Sark, MD  metFORMIN (GLUCOPHAGE-XR) 500 MG 24 hr tablet TAKE 1 TABLET BY MOUTH TWICE A DAY 01/02/21   Cassandria Anger, MD  nitroGLYCERIN (NITROSTAT) 0.4 MG SL tablet Place 1 tablet (0.4 mg total) under the tongue every 5 (five) minutes x 3 doses as needed for chest pain. 03/16/19   Cheryln Manly, NP  Omega-3 1000 MG CAPS Take 1,000 mg by mouth 2 (two) times daily.    [provider]  ticagrelor (BRILINTA) 60 MG TABS tablet Take 1 tablet (60 mg total) by mouth 2 (two) times daily. 11/13/20   Satira Sark, MD    Allergies    Bee venom and Penicillins  Review of Systems   Review of Systems  Constitutional: Negative for appetite change and fatigue.  HENT: Negative for congestion, ear discharge and sinus pressure.   Eyes: Negative for discharge.  Respiratory: Negative for cough.   Cardiovascular: Positive for palpitations.  Gastrointestinal: Negative for abdominal pain and diarrhea.  Genitourinary: Negative for frequency and hematuria.  Musculoskeletal: Negative for back pain.  Skin: Negative for rash.  Neurological: Negative for seizures and headaches.  Psychiatric/Behavioral: Negative for hallucinations.    Physical Exam Updated Vital Signs BP (!) 126/54 (BP Location: Right Arm)   Pulse 83   Resp (!) 21   Ht _0  (1.803 m)   Wt 133.8 kg   SpO2 99%   BMI 41.14 kg/m   Physical Exam Vitals and nursing note reviewed.  Constitutional:      Appearance: He is well-developed.  HENT:     Head: Normocephalic.     Nose: Nose normal.  Eyes:     General: No scleral icterus.    Conjunctiva/sclera: Conjunctivae normal.  Neck:     Thyroid: No thyromegaly.  Cardiovascular:     Rate and Rhythm: Regular rhythm. Tachycardia present.     Heart sounds: No murmur heard. No friction rub. No gallop.   Pulmonary:     Breath sounds: No stridor. No wheezing or rales.  Chest:     Chest wall: No tenderness.  Abdominal:     General: There is no distension.     Tenderness: There is no abdominal tenderness. There is no rebound.  Musculoskeletal:        General: Normal range of motion.      Cervical back: Neck supple.  Lymphadenopathy:     Cervical: No cervical adenopathy.  Skin:    Findings: No erythema or rash.  Neurological:     Mental Status: He is alert and oriented to person, place, and time.     Motor: No abnormal muscle tone.     Coordination: Coordination normal.  Psychiatric:        Behavior: Behavior normal.     ED Results / Procedures / Treatments   Labs (all labs ordered are listed, but only abnormal results are displayed) Labs Reviewed  CBC WITH DIFFERENTIAL/PLATELET - Abnormal; Notable for the following components:      Result Value   WBC 15.0 (*)    Neutro Abs 8.3 (*)    Lymphs Abs 5.0 (*)    Monocytes Absolute 1.1 (*)    All other components within normal limits  COMPREHENSIVE METABOLIC PANEL - Abnormal; Notable for the following components:   Glucose, Bld 234 (*)    ALT 46 (*)    All other components within normal limits  TROPONIN I (HIGH SENSITIVITY)  TROPONIN I (HIGH SENSITIVITY)    EKG None  Radiology DG Chest Port 1 View  Result Date: 03/09/2021 CLINICAL DATA:  Shortness of breath and chest pain. EXAM: PORTABLE CHEST 1 VIEW COMPARISON:  Chest radiograph dated 02/08/2021. FINDINGS: The heart is enlarged. The lungs are clear. The osseous structures are intact. IMPRESSION: No active disease. Electronically Signed   By: Zerita Boers M.D.   On: 03/09/2021 19:09    Procedures Procedures   Medications Ordered in ED Medications  adenosine (ADENOCARD) 6 MG/2ML injection (  Given 03/09/21 1813)    ED Course  I have reviewed the triage vital signs and the nursing notes.  Pertinent labs & imaging results that were available during my care of the patient were reviewed by me and considered in my medical decision making (see chart for details). CRITICAL CARE Performed by: Milton Ferguson Total critical care time: 45 minutes Critical care time was exclusive of separately billable procedures and treating other patients. Critical care was  necessary to treat or prevent imminent or life-threatening deterioration. Critical care was time spent personally by me on the  following activities: development of treatment plan with patient and/or surrogate as well as nursing, discussions with consultants, evaluation of patient's response to treatment, examination of patient, obtaining history from patient or surrogate, ordering and performing treatments and interventions, ordering and review of laboratory studies, ordering and review of radiographic studies, pulse oximetry and re-evaluation of patient's condition.   Patient was given adenosine 6 mg and successfully converted to normal sinus rhythm MDM Rules/Calculators/A&P                          Patient with an SVT that responded to adenosine.  He is referred back to cardiology for a discussion of ablation Final Clinical Impression(s) / ED Diagnoses Final diagnoses:  Palpitations    Rx / DC Orders ED Discharge Orders    None       Milton Ferguson, MD 03/11/21 1035

## 2021-03-15 ENCOUNTER — Other Ambulatory Visit: Payer: Self-pay | Admitting: "Endocrinology

## 2021-03-20 ENCOUNTER — Encounter: Payer: Self-pay | Admitting: "Endocrinology

## 2021-03-20 ENCOUNTER — Ambulatory Visit (INDEPENDENT_AMBULATORY_CARE_PROVIDER_SITE_OTHER): Payer: 59 | Admitting: "Endocrinology

## 2021-03-20 ENCOUNTER — Other Ambulatory Visit: Payer: Self-pay

## 2021-03-20 VITALS — BP 150/75 | HR 78 | Ht 71.0 in | Wt 298.0 lb

## 2021-03-20 DIAGNOSIS — E1165 Type 2 diabetes mellitus with hyperglycemia: Secondary | ICD-10-CM | POA: Diagnosis not present

## 2021-03-20 DIAGNOSIS — I10 Essential (primary) hypertension: Secondary | ICD-10-CM

## 2021-03-20 DIAGNOSIS — E782 Mixed hyperlipidemia: Secondary | ICD-10-CM

## 2021-03-20 LAB — POCT GLYCOSYLATED HEMOGLOBIN (HGB A1C): HbA1c, POC (controlled diabetic range): 8.6 % — AB (ref 0.0–7.0)

## 2021-03-20 MED ORDER — METFORMIN HCL ER 500 MG PO TB24: 500.0000 mg | ORAL_TABLET | Freq: Every day | ORAL | 1 refills | Status: DC

## 2021-03-20 NOTE — Progress Notes (Signed)
03/20/2021  Endocrinology follow-up note  Subjective:    Patient ID: Lance [name], male   DOB: 01-09-1976.  Lance [name] is being seen in follow-up for management of currently uncontrolled type 2 diabetes, complicated by coronary artery disease recently which required stent placement, hyperlipidemia, hypertension.   Past Medical History:  Diagnosis Date   CAD (coronary artery disease), native coronary artery    10/18 PCI/DES to mRCA, and OM, with total occlusion of dLCx with collaterals.    Essential hypertension    GERD (gastroesophageal reflux disease)    History of kidney stones    Hyperlipemia    SVT (supraventricular tachycardia) ([place]) 2015   Converted with adenosine   Type 2 diabetes mellitus ([place])    Past Surgical History:  Procedure Laterality Date   CARDIAC SURGERY     CORONARY ANGIOPLASTY WITH STENT PLACEMENT  07/15/2017   "2 stents"   CORONARY/GRAFT ACUTE MI REVASCULARIZATION N/A 03/12/2019   Procedure: Coronary/Graft Acute MI Revascularization;  Surgeon: Nelva Bush, MD;  Location: [facility];  Service: Cardiovascular;  Laterality: N/A;   LAPAROSCOPIC APPENDECTOMY N/A 11/12/2017   Procedure: APPENDECTOMY LAPAROSCOPIC, REPAIR OF INCARCERATED INCISIONAL HERNIA;  Surgeon: Michael Boston, MD;  Location: [facility];  Service: General;  Laterality: N/A;   LAPAROSCOPIC CHOLECYSTECTOMY     LEFT HEART CATH AND CORONARY ANGIOGRAPHY N/A 07/15/2017   Procedure: LEFT HEART CATH AND CORONARY ANGIOGRAPHY;  Surgeon: Sherren Mocha, MD;  Location: [facility];  Service: Cardiovascular;  Laterality: N/A;   LEFT HEART CATH AND CORONARY ANGIOGRAPHY N/A 03/12/2019   Procedure: LEFT HEART CATH AND CORONARY ANGIOGRAPHY;  Surgeon: Nelva Bush, MD;  Location: [facility];  Service: Cardiovascular;  Laterality: N/A;   Social History   Socioeconomic History   Marital status: Married    Spouse name: Not on file   Number of children: Not on file   Years of education: Not on  file   Highest education level: Not on file  Occupational History   Occupation: Superintendent    Employer: [employer]  Tobacco Use   Smoking status: Light Smoker    Packs/day: 0.50    Years: 26.00    Pack years: 13.00    Types: Cigarettes    Last attempt to quit: 03/07/2019    Years since quitting: 2.0   Smokeless tobacco: Never  Vaping Use   Vaping Use: Never used  Substance and Sexual Activity   Alcohol use: No   Drug use: No   Sexual activity: Yes    Birth control/protection: None  Other Topics Concern   Not on file  Social History Narrative   Not on file   Social Determinants of Health   Financial Resource Strain: Not on file  Food Insecurity: Not on file  Transportation Needs: Not on file  Physical Activity: Not on file  Stress: Not on file  Social Connections: Not on file   Outpatient Encounter Medications as of 03/20/2021  Medication Sig   ALPRAZolam (XANAX) 1 MG tablet Take 1 mg by mouth daily as needed for anxiety.   aspirin EC 81 MG tablet Take 1 tablet (81 mg total) by mouth daily with breakfast.   atorvastatin (LIPITOR) 80 MG tablet TAKE 1 TABLET BY MOUTH EVERYDAY AT BEDTIME   blood glucose meter kit and supplies KIT 1 each by Does not apply route 4 (four) times daily. Dispense based on patient and insurance preference. Use up to four times daily as directed. (FOR ICD-9 250.00, 250.01).  carvedilol (COREG) 3.125 MG tablet TAKE 1 TABLET (3.125 MG TOTAL) BY MOUTH 2 (TWO) TIMES DAILY WITH A MEAL.   Continuous Blood Gluc Receiver (FREESTYLE LIBRE 2 READER) DEVI As directed   Continuous Blood Gluc Sensor (FREESTYLE LIBRE 2 SENSOR) MISC 1 Piece by Does not apply route every 14 (fourteen) days.   glipiZIDE (GLUCOTROL XL) 5 MG 24 hr tablet TAKE 1 TABLET BY MOUTH EVERY DAY WITH BREAKFAST (Patient not taking: Reported on 03/20/2021)   insulin aspart (NOVOLOG FLEXPEN) 100 UNIT/ML FlexPen Inject 22-28 Units into the skin 3 (three) times daily with meals.   Insulin  Glargine (BASAGLAR KWIKPEN) 100 UNIT/ML INJECT 80 UNITS INTO THE SKIN AT BEDTIME.   Insulin Pen Needle (PEN NEEDLES) 31G X 8 MM MISC 1 each by Does not apply route 4 (four) times daily.   losartan (COZAAR) 50 MG tablet Take 1 tablet (50 mg total) by mouth daily.   metFORMIN (GLUCOPHAGE-XR) 500 MG 24 hr tablet Take 1 tablet (500 mg total) by mouth daily with breakfast.   nitroGLYCERIN (NITROSTAT) 0.4 MG SL tablet Place 1 tablet (0.4 mg total) under the tongue every 5 (five) minutes x 3 doses as needed for chest pain.   Omega-3 1000 MG CAPS Take 1,000 mg by mouth 2 (two) times daily.   ticagrelor (BRILINTA) 60 MG TABS tablet Take 1 tablet (60 mg total) by mouth 2 (two) times daily.   [DISCONTINUED] metFORMIN (GLUCOPHAGE-XR) 500 MG 24 hr tablet TAKE 1 TABLET BY MOUTH TWICE A DAY   No facility-administered encounter medications on file as of 03/20/2021.   ALLERGIES: Allergies  Allergen Reactions   Bee Venom Anaphylaxis and Swelling   Penicillins Hives, Itching and Rash    Has patient had a PCN reaction causing immediate rash, facial/tongue/throat swelling, SOB or lightheadedness with hypotension: No Has patient had a PCN reaction causing severe rash involving mucus membranes or skin necrosis: No Has patient had a PCN reaction that required hospitalization: No Has patient had a PCN reaction occurring within the last 10 years: No If all of the above answers are "NO", then may proceed with Cephalosporin use.    VACCINATION STATUS:  There is no immunization history on file for this patient.  Diabetes He presents for his follow-up diabetic visit. He has type 2 diabetes mellitus. Onset time: He was diagnosed at approximate age of 79 years. His disease course has been improving (Was recently hospitalized due to appendicitis after 2 months of antibiotic treatment.  He is status post appendectomy.  ). There are no hypoglycemic associated symptoms. Pertinent negatives for hypoglycemia include no  confusion, headaches, pallor or seizures. Pertinent negatives for diabetes include no blurred vision, no chest pain, no fatigue, no polydipsia, no polyphagia, no polyuria and no weakness. There are no hypoglycemic complications. Symptoms are improving. There are no diabetic complications. Risk factors for coronary artery disease include diabetes mellitus, dyslipidemia, family history, hypertension, male sex, obesity, tobacco exposure and sedentary lifestyle. Current diabetic treatment includes intensive insulin program (He is on Levemir 68 units BID and Humalog 10 units TIDAC.). His weight is fluctuating minimally. He is following a generally unhealthy diet. When asked about meal planning, he reported none. He has not had a previous visit with a dietitian. He rarely participates in exercise. His home blood glucose trend is decreasing steadily. His breakfast blood glucose range is generally 140-180 mg/dl. His lunch blood glucose range is generally 140-180 mg/dl. His dinner blood glucose range is generally 140-180 mg/dl. His bedtime blood glucose range  is generally 140-180 mg/dl. His overall blood glucose range is 140-180 mg/dl. (He has recently received a CGM device.  Lifestyle Libre device AGP report shows significant improvement in his profile including 46% time range, 48% above range, but 3% hypoglycemia.  His average blood glucose 170 for the last 15 days.  His point-of-care A1c is 8.6% improving from 10% during his last visit.  ) An ACE inhibitor/angiotensin II receptor blocker is being taken. He does not see a podiatrist.Eye exam is not current.  Hyperlipidemia This is a chronic problem. The current episode started more than 1 year ago. The problem is controlled. Exacerbating diseases include diabetes and obesity. Pertinent negatives include no chest pain, myalgias or shortness of breath. Current antihyperlipidemic treatment includes statins and bile acid squestrants. The current treatment provides  significant improvement of lipids. Risk factors for coronary artery disease include diabetes mellitus, dyslipidemia, hypertension, male sex, obesity and a sedentary lifestyle.  Hypertension This is a chronic problem. The current episode started more than 1 year ago. Pertinent negatives include no blurred vision, chest pain, headaches, neck pain, palpitations or shortness of breath. Risk factors for coronary artery disease include dyslipidemia, diabetes mellitus, male gender, smoking/tobacco exposure and sedentary lifestyle. Past treatments include ACE inhibitors. Hypertensive end-organ damage includes CAD/MI.  Review of systems  Constitutional: + Minimally fluctuating body weight,  current  Body mass index is 41.56 kg/m. , no fatigue, no subjective hyperthermia, no subjective hypothermia    Objective:    BP (!) 150/75   Pulse 78   Ht $R'5\' 11"'Ri$  (1.803 m)   Wt 298 lb (135.2 kg)   BMI 41.56 kg/m   Wt Readings from Last 3 Encounters:  03/20/21 298 lb (135.2 kg)  03/09/21 295 lb (133.8 kg)  02/08/21 295 lb (133.8 kg)      Physical Exam- Limited  Constitutional:  Body mass index is 41.56 kg/m. , not in acute distress  CMP ( most recent) CMP     Component Value Date/Time   NA 136 03/09/2021 1803   NA 140 12/10/2020 0802   K 3.7 03/09/2021 1803   CL 103 03/09/2021 1803   CO2 25 03/09/2021 1803   GLUCOSE 234 (H) 03/09/2021 1803   BUN 14 03/09/2021 1803   BUN 13 12/10/2020 0802   CREATININE 1.10 03/09/2021 1803   CREATININE 0.91 04/30/2020 0728   CALCIUM 9.0 03/09/2021 1803   PROT 7.1 03/09/2021 1803   PROT 6.7 12/10/2020 0802   ALBUMIN 3.9 03/09/2021 1803   ALBUMIN 4.3 12/10/2020 0802   AST 34 03/09/2021 1803   ALT 46 (H) 03/09/2021 1803   ALKPHOS 96 03/09/2021 1803   BILITOT 1.0 03/09/2021 1803   BILITOT 0.8 12/10/2020 0802   GFRNONAA >60 03/09/2021 1803   GFRNONAA 102 04/30/2020 0728   GFRAA 118 04/30/2020 0728     Diabetic Labs (most recent): Lab Results   Component Value Date   HGBA1C 8.6 (A) 03/20/2021   HGBA1C 10.0 (A) 12/13/2020   HGBA1C 11.9 09/05/2020    Lipid Panel     Component Value Date/Time   CHOL 115 12/10/2020 0802   TRIG 287 (H) 12/10/2020 0802   HDL 20 (L) 12/10/2020 0802   CHOLHDL 5.8 (H) 12/10/2020 0802   CHOLHDL 5.5 (H) 10/11/2019 0856   VLDL 47 (H) 03/13/2019 0547   LDLCALC 50 12/10/2020 0802   LDLCALC 63 10/11/2019 0856     Assessment & Plan:   1. Uncontrolled type 2 diabetes mellitus with chronic kidney disease, coronary artery disease,  with long-term current use of insulin.  He has recently received a CGM device.  Lifestyle Libre device AGP report shows significant improvement in his profile including 46% time range, 48% above range, but 3% hypoglycemia.  His average blood glucose 170 for the last 15 days.  His point-of-care A1c is 8.6% improving from 10% during his last visit.     Recent labs reviewed. -his diabetes is complicated by obesity/sedentary life, chronic heavy smoking and MUHAMAD SERANO remains at a high risk for more acute and chronic complications which include CAD, CVA, CKD, retinopathy, and neuropathy. These are all discussed in detail with the patient.  - I have counseled him on diet management and weight loss, by adopting a carbohydrate restricted/protein rich diet.  - he acknowledges that there is a room for improvement in his food and drink choices. - Suggestion is made for him to avoid simple carbohydrates  from his diet including Cakes, Sweet Desserts, Ice Cream, Soda (diet and regular), Sweet Tea, Candies, Chips, Cookies, Store Bought Juices, Alcohol in Excess of  1-2 drinks a day, Artificial Sweeteners,  Coffee Creamer, and "Sugar-free" Products, Lemonade. This will help patient to have more stable blood glucose profile and potentially avoid unintended weight gain.   - I encouraged him to switch to  unprocessed or minimally processed complex starch and increased protein intake (animal or  plant source), fruits, and vegetables.  - he is advised to stick to a routine mealtimes to eat 3 meals  a day and avoid unnecessary snacks ( to snack only to correct hypoglycemia).   - I have approached him with the following individualized plan to manage diabetes and patient agrees:   -He will continue to require intensive treatment with basal/bolus insulin in order for him to achieve and maintain control of diabetes to target.    -In light of his presentation with with near target glycemic profile, he is advised to remain on same regimen including Basaglar 80 units nightly, Humalog 22-28   units 3 times daily before meals, associated with strict monitoring of blood glucose 4 times a day-before meals and at bedtime . -He is advised to use his CGM at all times, especially monitoring before meals and at bedtime.  -Patient is encouraged to call clinic for blood glucose levels less than 70 or above 200 mg /dl.  -He is advised to continue with Metformin 500 mg XR p.o. once daily. -He declined offer for weekly incretin injection therapy. -He is benefiting from low-dose glipizide.  He is advised to continue glipizide 5 mg XL p.o. daily at breakfast .  - Patient specific target  A1c;  LDL, HDL, Triglycerides,  were discussed in detail.  2) BP/HTN: -His blood pressure is not controlled to target.   He is advised to continue  with his current blood pressure medications including lisinopril 40 mg p.o. daily along with his carvedilol 3.125 mg p.o. twice daily.    3) Lipids: His recent lipid panel showed improving LDL to 50.  He is advised to continue Lipitor 80 mg p.o. nightly, omega-3 fatty acids 1000 mg p.o. twice daily.     4)  Weight/Diet: His BMI is 91.69-IHWTUUE complicating his diabetes care.  He is a candidate for major weight loss.  CDE Consult has been initiated  , exercise, and detailed carbohydrates information provided.  He is an ideal candidate for bariatric surgery.  He declined  bariatric surgery as an option for weight control.    5) Chronic Care/Health Maintenance:  -  he  is on ACEI/ARB and Statin medications and  is encouraged to continue to follow up with Ophthalmology, Dentist,  Podiatrist at least yearly or according to recommendations, and advised to  quit smoking. I have recommended yearly flu vaccine and pneumonia vaccination at least every 5 years; moderate intensity exercise for up to 150 minutes weekly; and  sleep for at least 7 hours a day.  - I advised patient to maintain close follow up with Lanelle Bal, PA-C for primary care needs.    I spent 45 minutes in the care of the patient today including review of labs from [facility], Lipids, Thyroid Function, Hematology (current and previous including abstractions from other facilities); face-to-face time discussing  his blood glucose readings/logs, discussing hypoglycemia and hyperglycemia episodes and symptoms, medications doses, his options of short and long term treatment based on the latest standards of care / guidelines;  discussion about incorporating lifestyle medicine;  and documenting the encounter.    Please refer to Patient Instructions for Blood Glucose Monitoring and Insulin/Medications Dosing Guide"  in media tab for additional information. Please  also refer to " Patient Self Inventory" in the Media  tab for reviewed elements of pertinent patient history.  Redmond Pulling participated in the discussions, expressed understanding, and voiced agreement with the above plans.  All questions were answered to his satisfaction. he is encouraged to contact clinic should he have any questions or concerns prior to his return visit.  Follow up plan: - Return in about 4 months (around 07/20/2021) for Bring Meter and Logs- A1c in Office.  Glade Lloyd, MD Phone: [phone]  Fax: [phone]  -  This note was partially dictated with voice recognition software. Similar sounding words can be transcribed inadequately or  may not  be corrected upon review.  03/20/2021, 6:07 PM

## 2021-03-20 NOTE — Patient Instructions (Signed)

## 2021-03-26 ENCOUNTER — Other Ambulatory Visit: Payer: Self-pay

## 2021-03-26 ENCOUNTER — Ambulatory Visit: Payer: 59 | Admitting: Internal Medicine

## 2021-03-26 ENCOUNTER — Encounter: Payer: Self-pay | Admitting: Internal Medicine

## 2021-03-26 VITALS — BP 144/92 | HR 86 | Ht 71.0 in | Wt 299.4 lb

## 2021-03-26 DIAGNOSIS — Z01818 Encounter for other preprocedural examination: Secondary | ICD-10-CM | POA: Diagnosis not present

## 2021-03-26 DIAGNOSIS — I471 Supraventricular tachycardia: Secondary | ICD-10-CM | POA: Diagnosis not present

## 2021-03-26 NOTE — Patient Instructions (Addendum)
Medication Instructions:  Your physician recommends that you continue on your current medications as directed. Please refer to the Current Medication list given to you today.  Do not take Coreg 2 days prior to procedure.   *If you need a refill on your cardiac medications before your next appointment, please call your pharmacy*   Lab Work: NONE   If you have labs (blood work) drawn today and your tests are completely normal, you will receive your results only by: MyChart Message (if you have MyChart) OR A paper copy in the mail If you have any lab test that is abnormal or we need to change your treatment, we will call you to review the results.   Testing/Procedures: NONE    Follow-Up: At [facility], you and your health needs are our priority.  As part of our continuing mission to provide you with exceptional heart care, we have created designated Provider Care Teams.  These Care Teams include your primary Cardiologist (physician) and Advanced Practice Providers (APPs -  Physician Assistants and Nurse Practitioners) who all work together to provide you with the care you need, when you need it.  We recommend signing up for the patient portal called "MyChart".  Sign up information is provided on this After Visit Summary.  MyChart is used to connect with patients for Virtual Visits (Telemedicine).  Patients are able to view lab/test results, encounter notes, upcoming appointments, etc.  Non-urgent messages can be sent to your provider as well.   To learn more about what you can do with MyChart, go to [email or web address].    Your next appointment:    SVT Ablation dates July 8, July 18, July 25  The format for your next appointment:   In Person  Provider:   Lewayne Bunting, MD   Other Instructions Thank you for choosing [facility]!

## 2021-03-26 NOTE — Progress Notes (Signed)
    HPI Mr. Eslinger is referred by Dr. Branch for evaluation of SVT. He is a pleasant 45 yo man with CAD, s/p stenting, who has had symptomatic SVT for about 4 years. His spells start and stop suddenly and break with IV adenosine. He has been on a beta blocker but had recurrent symptoms. He notes that when he goes into SVT, he feels bad though he has not had syncope.  Allergies  Allergen Reactions   Bee Venom Anaphylaxis and Swelling   Penicillins Hives, Itching and Rash    Has patient had a PCN reaction causing immediate rash, facial/tongue/throat swelling, SOB or lightheadedness with hypotension: No Has patient had a PCN reaction causing severe rash involving mucus membranes or skin necrosis: No Has patient had a PCN reaction that required hospitalization: No Has patient had a PCN reaction occurring within the last 10 years: No If all of the above answers are "NO", then may proceed with Cephalosporin use.      Current Outpatient Medications  Medication Sig Dispense Refill   ALPRAZolam (XANAX) 1 MG tablet Take 1 mg by mouth daily as needed for anxiety.     aspirin EC 81 MG tablet Take 1 tablet (81 mg total) by mouth daily with breakfast. 120 tablet 5   atorvastatin (LIPITOR) 80 MG tablet TAKE 1 TABLET BY MOUTH EVERYDAY AT BEDTIME 90 tablet 1   blood glucose meter kit and supplies KIT 1 each by Does not apply route 4 (four) times daily. Dispense based on patient and insurance preference. Use up to four times daily as directed. (FOR ICD-9 250.00, 250.01). 1 each 0   carvedilol (COREG) 3.125 MG tablet TAKE 1 TABLET (3.125 MG TOTAL) BY MOUTH 2 (TWO) TIMES DAILY WITH A MEAL. 180 tablet 3   Continuous Blood Gluc Receiver (FREESTYLE LIBRE 2 READER) DEVI As directed 1 each 0   Continuous Blood Gluc Sensor (FREESTYLE LIBRE 2 SENSOR) MISC 1 Piece by Does not apply route every 14 (fourteen) days. 2 each 3   insulin aspart (NOVOLOG FLEXPEN) 100 UNIT/ML FlexPen Inject 22-28 Units into the skin 3  (three) times daily with meals. 75 mL 0   Insulin Glargine (BASAGLAR KWIKPEN) 100 UNIT/ML INJECT 80 UNITS INTO THE SKIN AT BEDTIME. 75 mL 1   Insulin Pen Needle (PEN NEEDLES) 31G X 8 MM MISC 1 each by Does not apply route 4 (four) times daily. 400 each 1   losartan (COZAAR) 50 MG tablet Take 1 tablet (50 mg total) by mouth daily. 90 tablet 1   metFORMIN (GLUCOPHAGE-XR) 500 MG 24 hr tablet Take 1 tablet (500 mg total) by mouth daily with breakfast. 90 tablet 1   nitroGLYCERIN (NITROSTAT) 0.4 MG SL tablet Place 1 tablet (0.4 mg total) under the tongue every 5 (five) minutes x 3 doses as needed for chest pain. 25 tablet 1   Omega-3 1000 MG CAPS Take 1,000 mg by mouth 2 (two) times daily.     ticagrelor (BRILINTA) 60 MG TABS tablet Take 1 tablet (60 mg total) by mouth 2 (two) times daily. 60 tablet 6   No current facility-administered medications for this visit.     Past Medical History:  Diagnosis Date   CAD (coronary artery disease), native coronary artery    10/18 PCI/DES to mRCA, and OM, with total occlusion of dLCx with collaterals.    Essential hypertension    GERD (gastroesophageal reflux disease)    History of kidney stones    Hyperlipemia      SVT (supraventricular tachycardia) ([facility]) 2015   Converted with adenosine   Type 2 diabetes mellitus ([facility])     ROS:   All systems reviewed and negative except as noted in the HPI.   Past Surgical History:  Procedure Laterality Date   CARDIAC SURGERY     CORONARY ANGIOPLASTY WITH STENT PLACEMENT  07/15/2017   "2 stents"   CORONARY/GRAFT ACUTE MI REVASCULARIZATION N/A 03/12/2019   Procedure: Coronary/Graft Acute MI Revascularization;  Surgeon: End, Christopher, MD;  Location: [facility];  Service: Cardiovascular;  Laterality: N/A;   LAPAROSCOPIC APPENDECTOMY N/A 11/12/2017   Procedure: APPENDECTOMY LAPAROSCOPIC, REPAIR OF INCARCERATED INCISIONAL HERNIA;  Surgeon: Gross, Steven, MD;  Location: [facility];  Service: General;  Laterality:  N/A;   LAPAROSCOPIC CHOLECYSTECTOMY     LEFT HEART CATH AND CORONARY ANGIOGRAPHY N/A 07/15/2017   Procedure: LEFT HEART CATH AND CORONARY ANGIOGRAPHY;  Surgeon: Cooper, Michael, MD;  Location: [facility];  Service: Cardiovascular;  Laterality: N/A;   LEFT HEART CATH AND CORONARY ANGIOGRAPHY N/A 03/12/2019   Procedure: LEFT HEART CATH AND CORONARY ANGIOGRAPHY;  Surgeon: End, Christopher, MD;  Location: [facility];  Service: Cardiovascular;  Laterality: N/A;     Family History  Problem Relation Age of Onset   Heart disease Mother 62       CABG   Diabetes Mother    Gout Mother    Heart attack Father 57     Social History   Socioeconomic History   Marital status: Married    Spouse name: Not on file   Number of children: Not on file   Years of education: Not on file   Highest education level: Not on file  Occupational History   Occupation: Superintendent    Employer: [employer]  Tobacco Use   Smoking status: Light Smoker    Packs/day: 0.50    Years: 26.00    Pack years: 13.00    Types: Cigarettes    Last attempt to quit: 03/07/2019    Years since quitting: 2.0   Smokeless tobacco: Never  Vaping Use   Vaping Use: Never used  Substance and Sexual Activity   Alcohol use: No   Drug use: No   Sexual activity: Yes    Birth control/protection: None  Other Topics Concern   Not on file  Social History Narrative   Not on file   Social Determinants of Health   Financial Resource Strain: Not on file  Food Insecurity: Not on file  Transportation Needs: Not on file  Physical Activity: Not on file  Stress: Not on file  Social Connections: Not on file  Intimate Partner Violence: Not on file     BP (!) 144/92   Pulse 86   Ht 5' 11" (1.803 m)   Wt 299 lb 6.4 oz (135.8 kg)   SpO2 96%   BMI 41.76 kg/m   Physical Exam:  Well appearing NAD HEENT: Unremarkable Neck:  No JVD, no thyromegally Lymphatics:  No adenopathy Back:  No CVA  tenderness Lungs:  Clear HEART:  Regular rate rhythm, no murmurs, no rubs, no clicks Abd:  soft, positive bowel sounds, no organomegally, no rebound, no guarding Ext:  2 plus pulses, no edema, no cyanosis, no clubbing Skin:  No rashes no nodules Neuro:  CN II through XII intact, motor grossly intact  EKG - reviewed, NSR with no pre-excitation SVT ECG demonstrates a short RP tachy   Assess/Plan:  SVT - I have discussed the treatment   options with the patient and the risks/benefits/goals/expectations of EP study and ablation were reviewed and he would like to proceed.  Obesity - after his ablation, he will need to work on his weight. CAD - he is s/p stenting and is asymptomatic.  HTN - his bp is a bit elevated and we will follow.  Rosalita Carey,MD 

## 2021-03-26 NOTE — H&P (View-Only) (Signed)
    HPI Mr. Sekula is referred by Dr. Branch for evaluation of SVT. He is a pleasant 44 yo man with CAD, s/p stenting, who has had symptomatic SVT for about 4 years. His spells start and stop suddenly and break with IV adenosine. He has been on a beta blocker but had recurrent symptoms. He notes that when he goes into SVT, he feels bad though he has not had syncope.  Allergies  Allergen Reactions   Bee Venom Anaphylaxis and Swelling   Penicillins Hives, Itching and Rash    Has patient had a PCN reaction causing immediate rash, facial/tongue/throat swelling, SOB or lightheadedness with hypotension: No Has patient had a PCN reaction causing severe rash involving mucus membranes or skin necrosis: No Has patient had a PCN reaction that required hospitalization: No Has patient had a PCN reaction occurring within the last 10 years: No If all of the above answers are "NO", then may proceed with Cephalosporin use.      Current Outpatient Medications  Medication Sig Dispense Refill   ALPRAZolam (XANAX) 1 MG tablet Take 1 mg by mouth daily as needed for anxiety.     aspirin EC 81 MG tablet Take 1 tablet (81 mg total) by mouth daily with breakfast. 120 tablet 5   atorvastatin (LIPITOR) 80 MG tablet TAKE 1 TABLET BY MOUTH EVERYDAY AT BEDTIME 90 tablet 1   blood glucose meter kit and supplies KIT 1 each by Does not apply route 4 (four) times daily. Dispense based on patient and insurance preference. Use up to four times daily as directed. (FOR ICD-9 250.00, 250.01). 1 each 0   carvedilol (COREG) 3.125 MG tablet TAKE 1 TABLET (3.125 MG TOTAL) BY MOUTH 2 (TWO) TIMES DAILY WITH A MEAL. 180 tablet 3   Continuous Blood Gluc Receiver (FREESTYLE LIBRE 2 READER) DEVI As directed 1 each 0   Continuous Blood Gluc Sensor (FREESTYLE LIBRE 2 SENSOR) MISC 1 Piece by Does not apply route every 14 (fourteen) days. 2 each 3   insulin aspart (NOVOLOG FLEXPEN) 100 UNIT/ML FlexPen Inject 22-28 Units into the skin 3  (three) times daily with meals. 75 mL 0   Insulin Glargine (BASAGLAR KWIKPEN) 100 UNIT/ML INJECT 80 UNITS INTO THE SKIN AT BEDTIME. 75 mL 1   Insulin Pen Needle (PEN NEEDLES) 31G X 8 MM MISC 1 each by Does not apply route 4 (four) times daily. 400 each 1   losartan (COZAAR) 50 MG tablet Take 1 tablet (50 mg total) by mouth daily. 90 tablet 1   metFORMIN (GLUCOPHAGE-XR) 500 MG 24 hr tablet Take 1 tablet (500 mg total) by mouth daily with breakfast. 90 tablet 1   nitroGLYCERIN (NITROSTAT) 0.4 MG SL tablet Place 1 tablet (0.4 mg total) under the tongue every 5 (five) minutes x 3 doses as needed for chest pain. 25 tablet 1   Omega-3 1000 MG CAPS Take 1,000 mg by mouth 2 (two) times daily.     ticagrelor (BRILINTA) 60 MG TABS tablet Take 1 tablet (60 mg total) by mouth 2 (two) times daily. 60 tablet 6   No current facility-administered medications for this visit.     Past Medical History:  Diagnosis Date   CAD (coronary artery disease), native coronary artery    10/18 PCI/DES to mRCA, and OM, with total occlusion of dLCx with collaterals.    Essential hypertension    GERD (gastroesophageal reflux disease)    History of kidney stones    Hyperlipemia      SVT (supraventricular tachycardia) ([place]) 2015   Converted with adenosine   Type 2 diabetes mellitus ([place])     ROS:   All systems reviewed and negative except as noted in the HPI.   Past Surgical History:  Procedure Laterality Date   CARDIAC SURGERY     CORONARY ANGIOPLASTY WITH STENT PLACEMENT  07/15/2017   "2 stents"   CORONARY/GRAFT ACUTE MI REVASCULARIZATION N/A 03/12/2019   Procedure: Coronary/Graft Acute MI Revascularization;  Surgeon: Nelva Bush, MD;  Location: [facility];  Service: Cardiovascular;  Laterality: N/A;   LAPAROSCOPIC APPENDECTOMY N/A 11/12/2017   Procedure: APPENDECTOMY LAPAROSCOPIC, REPAIR OF INCARCERATED INCISIONAL HERNIA;  Surgeon: Michael Boston, MD;  Location: [facility];  Service: General;  Laterality:  N/A;   LAPAROSCOPIC CHOLECYSTECTOMY     LEFT HEART CATH AND CORONARY ANGIOGRAPHY N/A 07/15/2017   Procedure: LEFT HEART CATH AND CORONARY ANGIOGRAPHY;  Surgeon: Sherren Mocha, MD;  Location: [facility];  Service: Cardiovascular;  Laterality: N/A;   LEFT HEART CATH AND CORONARY ANGIOGRAPHY N/A 03/12/2019   Procedure: LEFT HEART CATH AND CORONARY ANGIOGRAPHY;  Surgeon: Nelva Bush, MD;  Location: [facility];  Service: Cardiovascular;  Laterality: N/A;     Family History  Problem Relation Age of Onset   Heart disease Mother 2       CABG   Diabetes Mother    Gout Mother    Heart attack Father 26     Social History   Socioeconomic History   Marital status: Married    Spouse name: Not on file   Number of children: Not on file   Years of education: Not on file   Highest education level: Not on file  Occupational History   Occupation: Superintendent    Employer: [employer]  Tobacco Use   Smoking status: Light Smoker    Packs/day: 0.50    Years: 26.00    Pack years: 13.00    Types: Cigarettes    Last attempt to quit: 03/07/2019    Years since quitting: 2.0   Smokeless tobacco: Never  Vaping Use   Vaping Use: Never used  Substance and Sexual Activity   Alcohol use: No   Drug use: No   Sexual activity: Yes    Birth control/protection: None  Other Topics Concern   Not on file  Social History Narrative   Not on file   Social Determinants of Health   Financial Resource Strain: Not on file  Food Insecurity: Not on file  Transportation Needs: Not on file  Physical Activity: Not on file  Stress: Not on file  Social Connections: Not on file  Intimate Partner Violence: Not on file     BP (!) 144/92   Pulse 86   Ht 5' 11" (1.803 m)   Wt 299 lb 6.4 oz (135.8 kg)   SpO2 96%   BMI 41.76 kg/m   Physical Exam:  Well appearing NAD HEENT: Unremarkable Neck:  No JVD, no thyromegally Lymphatics:  No adenopathy Back:  No CVA  tenderness Lungs:  Clear HEART:  Regular rate rhythm, no murmurs, no rubs, no clicks Abd:  soft, positive bowel sounds, no organomegally, no rebound, no guarding Ext:  2 plus pulses, no edema, no cyanosis, no clubbing Skin:  No rashes no nodules Neuro:  CN II through XII intact, motor grossly intact  EKG - reviewed, NSR with no pre-excitation SVT ECG demonstrates a short RP tachy   Assess/Plan:  SVT - I have discussed the treatment  options with the patient and the risks/benefits/goals/expectations of EP study and ablation were reviewed and he would like to proceed.  Obesity - after his ablation, he will need to work on his weight. CAD - he is s/p stenting and is asymptomatic.  HTN - his bp is a bit elevated and we will follow.  Carleene Overlie Carletta Feasel,MD

## 2021-03-27 ENCOUNTER — Encounter: Payer: Self-pay | Admitting: *Deleted

## 2021-03-27 NOTE — Addendum Note (Signed)
Addended by: Kerney Elbe on: 03/27/2021 10:16 AM   Modules accepted: Orders

## 2021-04-07 ENCOUNTER — Other Ambulatory Visit: Payer: Self-pay | Admitting: Cardiology

## 2021-04-07 ENCOUNTER — Other Ambulatory Visit: Payer: Self-pay | Admitting: "Endocrinology

## 2021-04-09 ENCOUNTER — Other Ambulatory Visit: Payer: Self-pay | Admitting: "Endocrinology

## 2021-04-12 ENCOUNTER — Other Ambulatory Visit: Payer: Self-pay | Admitting: Nurse Practitioner

## 2021-04-12 MED ORDER — BASAGLAR KWIKPEN 100 UNIT/ML ~~LOC~~ SOPN: 80.0000 [IU] | PEN_INJECTOR | Freq: Every day | SUBCUTANEOUS | 1 refills | Status: DC

## 2021-04-17 ENCOUNTER — Telehealth: Payer: Self-pay | Admitting: Internal Medicine

## 2021-04-17 DIAGNOSIS — Z0279 Encounter for issue of other medical certificate: Secondary | ICD-10-CM

## 2021-04-17 NOTE — Telephone Encounter (Signed)
FMLA forms for pt's wife dropped off in [place] office   HEP 04/17/2021

## 2021-04-18 ENCOUNTER — Other Ambulatory Visit: Payer: Self-pay

## 2021-04-18 ENCOUNTER — Other Ambulatory Visit (HOSPITAL_COMMUNITY)
Admission: RE | Admit: 2021-04-18 | Discharge: 2021-04-18 | Disposition: A | Discharge status: Home / Self Care | Payer: 59 | Source: Ambulatory Visit | Attending: Internal Medicine | Admitting: Internal Medicine

## 2021-04-18 DIAGNOSIS — I471 Supraventricular tachycardia: Secondary | ICD-10-CM | POA: Diagnosis present

## 2021-04-18 DIAGNOSIS — Z01818 Encounter for other preprocedural examination: Secondary | ICD-10-CM | POA: Diagnosis present

## 2021-04-18 LAB — CBC WITH DIFFERENTIAL/PLATELET
Abs Immature Granulocytes: 0.02 10*3/uL (ref 0.00–0.07)
Basophils Absolute: 0.1 10*3/uL (ref 0.0–0.1)
Basophils Relative: 1 %
Eosinophils Absolute: 0.4 10*3/uL (ref 0.0–0.5)
Eosinophils Relative: 4 %
HCT: 43 % (ref 39.0–52.0)
Hemoglobin: 13.9 g/dL (ref 13.0–17.0)
Immature Granulocytes: 0 %
Lymphocytes Relative: 35 %
Lymphs Abs: 3.9 10*3/uL (ref 0.7–4.0)
MCH: 28.6 pg (ref 26.0–34.0)
MCHC: 32.3 g/dL (ref 30.0–36.0)
MCV: 88.5 fL (ref 80.0–100.0)
Monocytes Absolute: 0.9 10*3/uL (ref 0.1–1.0)
Monocytes Relative: 8 %
Neutro Abs: 5.9 10*3/uL (ref 1.7–7.7)
Neutrophils Relative %: 52 %
Platelets: 218 10*3/uL (ref 150–400)
RBC: 4.86 MIL/uL (ref 4.22–5.81)
RDW: 13.4 % (ref 11.5–15.5)
WBC: 11.2 10*3/uL — ABNORMAL HIGH (ref 4.0–10.5)
nRBC: 0 % (ref 0.0–0.2)

## 2021-04-18 LAB — BASIC METABOLIC PANEL
Anion gap: 6 (ref 5–15)
BUN: 12 mg/dL (ref 6–20)
CO2: 28 mmol/L (ref 22–32)
Calcium: 9 mg/dL (ref 8.9–10.3)
Chloride: 104 mmol/L (ref 98–111)
Creatinine, Ser: 0.92 mg/dL (ref 0.61–1.24)
GFR, Estimated: >60 mL/min (ref >60)
Glucose, Bld: 197 mg/dL — ABNORMAL HIGH (ref 70–99)
Potassium: 4 mmol/L (ref 3.5–5.1)
Sodium: 138 mmol/L (ref 135–145)

## 2021-04-19 NOTE — Pre-Procedure Instructions (Signed)
Instructed patient on the following items: Arrival time 0700 Nothing to eat or drink after midnight No meds AM of procedure Responsible person to drive you home and stay with you for 24 hrs  Have you missed any doses of- Coreg- Take today,hold Saturday and Sunday

## 2021-04-22 ENCOUNTER — Other Ambulatory Visit: Payer: Self-pay

## 2021-04-22 ENCOUNTER — Ambulatory Visit (HOSPITAL_COMMUNITY)
Admission: RE | Admit: 2021-04-22 | Discharge: 2021-04-22 | Disposition: A | Discharge status: Home / Self Care | Payer: 59 | Attending: Internal Medicine | Admitting: Internal Medicine

## 2021-04-22 ENCOUNTER — Encounter (HOSPITAL_COMMUNITY)
Admission: RE | Disposition: A | Discharge status: Home / Self Care | Payer: Self-pay | Source: Home / Self Care | Attending: Internal Medicine

## 2021-04-22 DIAGNOSIS — I10 Essential (primary) hypertension: Secondary | ICD-10-CM | POA: Insufficient documentation

## 2021-04-22 DIAGNOSIS — Z6841 Body Mass Index (BMI) 40.0 and over, adult: Secondary | ICD-10-CM | POA: Insufficient documentation

## 2021-04-22 DIAGNOSIS — I2510 Atherosclerotic heart disease of native coronary artery without angina pectoris: Secondary | ICD-10-CM | POA: Insufficient documentation

## 2021-04-22 DIAGNOSIS — Z794 Long term (current) use of insulin: Secondary | ICD-10-CM | POA: Diagnosis not present

## 2021-04-22 DIAGNOSIS — Z7982 Long term (current) use of aspirin: Secondary | ICD-10-CM | POA: Diagnosis not present

## 2021-04-22 DIAGNOSIS — E669 Obesity, unspecified: Secondary | ICD-10-CM | POA: Insufficient documentation

## 2021-04-22 DIAGNOSIS — Z880 Allergy status to penicillin: Secondary | ICD-10-CM | POA: Diagnosis not present

## 2021-04-22 DIAGNOSIS — Z79899 Other long term (current) drug therapy: Secondary | ICD-10-CM | POA: Insufficient documentation

## 2021-04-22 DIAGNOSIS — F17210 Nicotine dependence, cigarettes, uncomplicated: Secondary | ICD-10-CM | POA: Diagnosis not present

## 2021-04-22 DIAGNOSIS — I471 Supraventricular tachycardia: Secondary | ICD-10-CM

## 2021-04-22 DIAGNOSIS — Z91030 Bee allergy status: Secondary | ICD-10-CM | POA: Diagnosis not present

## 2021-04-22 DIAGNOSIS — Z7984 Long term (current) use of oral hypoglycemic drugs: Secondary | ICD-10-CM | POA: Insufficient documentation

## 2021-04-22 DIAGNOSIS — Z955 Presence of coronary angioplasty implant and graft: Secondary | ICD-10-CM | POA: Insufficient documentation

## 2021-04-22 HISTORY — PX: SVT ABLATION: EP1225

## 2021-04-22 LAB — GLUCOSE, CAPILLARY
Glucose-Capillary: 209 mg/dL — ABNORMAL HIGH (ref 70–99)
Glucose-Capillary: 119 mg/dL — ABNORMAL HIGH (ref 70–99)

## 2021-04-22 SURGERY — SVT ABLATION

## 2021-04-22 MED ORDER — MIDAZOLAM HCL 5 MG/5ML IJ SOLN
INTRAMUSCULAR | Status: AC
  Filled 2021-04-22: qty 5

## 2021-04-22 MED ORDER — SODIUM CHLORIDE 0.9 % IV SOLN: INTRAVENOUS | Status: DC

## 2021-04-22 MED ORDER — MIDAZOLAM HCL 5 MG/5ML IJ SOLN
INTRAMUSCULAR | Status: DC | PRN
  Administered 2021-04-22 (×7): 1 mg via INTRAVENOUS
  Administered 2021-04-22: 2 mg via INTRAVENOUS

## 2021-04-22 MED ORDER — FENTANYL CITRATE (PF) 100 MCG/2ML IJ SOLN
INTRAMUSCULAR | Status: AC
  Filled 2021-04-22: qty 2

## 2021-04-22 MED ORDER — BUPIVACAINE HCL (PF) 0.25 % IJ SOLN
INTRAMUSCULAR | Status: AC
  Filled 2021-04-22: qty 30

## 2021-04-22 MED ORDER — SODIUM CHLORIDE 0.9 % IV SOLN: 250.0000 mL | INTRAVENOUS | Status: DC | PRN

## 2021-04-22 MED ORDER — SODIUM CHLORIDE 0.9% FLUSH: 3.0000 mL | INTRAVENOUS | Status: DC | PRN

## 2021-04-22 MED ORDER — ONDANSETRON HCL 4 MG/2ML IJ SOLN: 4.0000 mg | Freq: Four times a day (QID) | INTRAMUSCULAR | Status: DC | PRN

## 2021-04-22 MED ORDER — SODIUM CHLORIDE 0.9% FLUSH: 3.0000 mL | Freq: Two times a day (BID) | INTRAVENOUS | Status: DC

## 2021-04-22 MED ORDER — HEPARIN (PORCINE) IN NACL 1000-0.9 UT/500ML-% IV SOLN
INTRAVENOUS | Status: DC | PRN
  Administered 2021-04-22: 500 mL

## 2021-04-22 MED ORDER — BUPIVACAINE HCL (PF) 0.25 % IJ SOLN
INTRAMUSCULAR | Status: AC
  Filled 2021-04-22: qty 60

## 2021-04-22 MED ORDER — ACETAMINOPHEN 325 MG PO TABS
650.0000 mg | ORAL_TABLET | ORAL | Status: DC | PRN
  Filled 2021-04-22: qty 2

## 2021-04-22 MED ORDER — FENTANYL CITRATE (PF) 100 MCG/2ML IJ SOLN
INTRAMUSCULAR | Status: DC | PRN
  Administered 2021-04-22 (×5): 12.5 ug via INTRAVENOUS
  Administered 2021-04-22: 25 ug via INTRAVENOUS
  Administered 2021-04-22 (×2): 12.5 ug via INTRAVENOUS

## 2021-04-22 MED ORDER — BUPIVACAINE HCL (PF) 0.25 % IJ SOLN
INTRAMUSCULAR | Status: DC | PRN
  Administered 2021-04-22: 30 mL
  Administered 2021-04-22: 60 mL
  Administered 2021-04-22: 30 mL

## 2021-04-22 SURGICAL SUPPLY — 16 items
BAG SNAP BAND KOVER 36X36 (MISCELLANEOUS) ×1 IMPLANT
CATH EZ STEER NAV 4MM D-F CUR (ABLATOR) ×1 IMPLANT
CATH JOSEPH QUAD ALLRED 6F REP (CATHETERS) ×2 IMPLANT
CATH JSN HEX 2-5-2 120 (CATHETERS) IMPLANT
CATH POLARIS X 2.5/5/2.5 DECAP (CATHETERS) ×1 IMPLANT
KIT MICROPUNCTURE NIT STIFF (SHEATH) ×1 IMPLANT
NDL 18GX3-1/2 9CM (NEEDLE) IMPLANT
NEEDLE 18GX3-1/2 9CM (NEEDLE) ×2 IMPLANT
PACK EP LATEX FREE (CUSTOM PROCEDURE TRAY) ×2
PACK EP LF (CUSTOM PROCEDURE TRAY) ×1 IMPLANT
PAD PRO RADIOLUCENT 2001M-C (PAD) ×2 IMPLANT
PATCH CARTO3 (PAD) ×1 IMPLANT
SHEATH PINNACLE 6F 10CM (SHEATH) ×2 IMPLANT
SHEATH PINNACLE 7F 10CM (SHEATH) ×1 IMPLANT
SHEATH PINNACLE 8F 10CM (SHEATH) ×1 IMPLANT
SHEATH PROBE COVER 6X72 (BAG) ×3 IMPLANT

## 2021-04-22 NOTE — Progress Notes (Signed)
Discharge instructions reviewed with pt and his wife. Both voice understanding. 

## 2021-04-22 NOTE — Progress Notes (Signed)
Site area: Right groin a 6  french venous sheaths X2 and a 8 french venous sheath was removed  Site Prior to Removal:  Level 0  Pressure Applied For 20 MINUTES    Bedrest Beginning at 1330p until 7pm  Manual:   Yes.    Patient Status During Pull:  stable  Post Pull Groin Site:  Level 0  Post Pull Instructions Given:  Yes.    Post Pull Pulses Present:  Yes.    Dressing Applied:  Yes.    Comments:

## 2021-04-22 NOTE — Discharge Instructions (Signed)

## 2021-04-22 NOTE — Interval H&P Note (Signed)
History and Physical Interval Note:  04/22/2021 9:25 AM  Lance [name]  has presented today for surgery, with the diagnosis of svt.  The various methods of treatment have been discussed with the patient and family. After consideration of risks, benefits and other options for treatment, the patient has consented to  Procedure(s): SVT ABLATION (N/A) as a surgical intervention.  The patient's history has been reviewed, patient examined, no change in status, stable for surgery.  I have reviewed the patient's chart and labs.  Questions were answered to the patient's satisfaction.     Lewayne Bunting

## 2021-04-22 NOTE — Progress Notes (Signed)
Per Dr Ladona Ridgel client to resume brillinta tomorrow; client and his wife notified and voiced understanding

## 2021-04-23 ENCOUNTER — Telehealth: Payer: Self-pay | Admitting: Internal Medicine

## 2021-04-23 ENCOUNTER — Encounter (HOSPITAL_COMMUNITY): Payer: Self-pay | Admitting: Internal Medicine

## 2021-04-23 NOTE — Telephone Encounter (Signed)
Verbalized to wife that pt should start Coreg back the day after his procedure. She voiced understanding.

## 2021-04-23 NOTE — Telephone Encounter (Signed)
Amanda Badgett(wife) called wants to know when or if her husband should take the carvedilol (COREG) 3.125 MG tabletl.  She states Dr Ladona Ridgel did abalation yesterday. She can be reached at [phone].

## 2021-04-24 NOTE — Telephone Encounter (Signed)
Form completed and faxed to  on 04/24/21. [facility]  - faxed completed forms and scanned in chart

## 2021-05-28 ENCOUNTER — Ambulatory Visit: Payer: 59 | Admitting: Internal Medicine

## 2021-05-31 ENCOUNTER — Ambulatory Visit: Payer: 59 | Admitting: Cardiology

## 2021-06-08 ENCOUNTER — Other Ambulatory Visit: Payer: Self-pay | Admitting: "Endocrinology

## 2021-06-08 DIAGNOSIS — E1165 Type 2 diabetes mellitus with hyperglycemia: Secondary | ICD-10-CM

## 2021-06-12 ENCOUNTER — Encounter: Payer: Self-pay | Admitting: Internal Medicine

## 2021-06-12 ENCOUNTER — Ambulatory Visit: Payer: 59 | Admitting: Internal Medicine

## 2021-06-12 ENCOUNTER — Other Ambulatory Visit: Payer: Self-pay

## 2021-06-12 VITALS — BP 154/80 | HR 83 | Ht 71.0 in | Wt 297.0 lb

## 2021-06-12 DIAGNOSIS — Z79899 Other long term (current) drug therapy: Secondary | ICD-10-CM

## 2021-06-12 DIAGNOSIS — I10 Essential (primary) hypertension: Secondary | ICD-10-CM

## 2021-06-12 MED ORDER — LISINOPRIL 40 MG PO TABS: 40.0000 mg | ORAL_TABLET | Freq: Every day | ORAL | 3 refills | Status: DC

## 2021-06-12 NOTE — Patient Instructions (Signed)
Medication Instructions:   Stop Taking Coreg  Stop Taking Losartan   Start Taking Lisinopril 40 mg Daily   *If you need a refill on your cardiac medications before your next appointment, please call your pharmacy*   Lab Work: Your physician recommends that you return for lab work in: 2 Weeks ( 06/26/21)   If you have labs (blood work) drawn today and your tests are completely normal, you will receive your results only by: MyChart Message (if you have MyChart) OR A paper copy in the mail If you have any lab test that is abnormal or we need to change your treatment, we will call you to review the results.   Testing/Procedures: NONE     Follow-Up: At [facility], you and your health needs are our priority.  As part of our continuing mission to provide you with exceptional heart care, we have created designated Provider Care Teams.  These Care Teams include your primary Cardiologist (physician) and Advanced Practice Providers (APPs -  Physician Assistants and Nurse Practitioners) who all work together to provide you with the care you need, when you need it.  We recommend signing up for the patient portal called "MyChart".  Sign up information is provided on this After Visit Summary.  MyChart is used to connect with patients for Virtual Visits (Telemedicine).  Patients are able to view lab/test results, encounter notes, upcoming appointments, etc.  Non-urgent messages can be sent to your provider as well.   To learn more about what you can do with MyChart, go to [email or web address].    Your next appointment:   1 year(s)  The format for your next appointment:   In Person  Provider:   Lewayne Bunting, MD   Other Instructions Your physician recommends that you schedule a follow-up appointment in: 2 Weeks for a blood pressure check.   Thank you for choosing [facility]!

## 2021-06-12 NOTE — Progress Notes (Signed)
HPI [name] returns for followup of SVT. He is a pleasant 45 yo man with CAD, s/p stenting, who has had symptomatic SVT for about 4 years. His spells start and stop suddenly and break with IV adenosine. 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 He underwent successful ablation rendering his SVT non-inducible. He has done well in the interim.  Allergies  Allergen Reactions   Bee Venom Anaphylaxis and Swelling   Penicillins Hives, Itching and Rash    Has patient had a PCN reaction causing immediate rash, facial/tongue/throat swelling, SOB or lightheadedness with hypotension: No Has patient had a PCN reaction causing severe rash involving mucus membranes or skin necrosis: No Has patient had a PCN reaction that required hospitalization: No Has patient had a PCN reaction occurring within the last 10 years: No If all of the above answers are "NO", then may proceed with Cephalosporin use.      Current Outpatient Medications  Medication Sig Dispense Refill   ALPRAZolam (XANAX) 1 MG tablet Take 1 mg by mouth daily as needed for anxiety.     aspirin EC 81 MG tablet Take 1 tablet (81 mg total) by mouth daily with breakfast. 120 tablet 5   atorvastatin (LIPITOR) 80 MG tablet TAKE 1 TABLET BY MOUTH EVERYDAY AT BEDTIME (Patient taking differently: Take 80 mg by mouth daily.) 90 tablet 1   blood glucose meter kit and supplies KIT 1 each by Does not apply route 4 (four) times daily. Dispense based on patient and insurance preference. Use up to four times daily as directed. (FOR ICD-9 250.00, 250.01). 1 each 0   carvedilol (COREG) 3.125 MG tablet TAKE 1 TABLET (3.125 MG TOTAL) BY MOUTH 2 (TWO) TIMES DAILY WITH A MEAL. (Patient taking differently: Take 3.125 mg by mouth daily.) 180 tablet 3   Continuous Blood Gluc Receiver (FREESTYLE LIBRE 2 READER) DEVI As directed 1 each 0   Continuous Blood Gluc Sensor (FREESTYLE LIBRE 2 SENSOR) MISC 1 PIECE BY DOES NOT  APPLY ROUTE EVERY 14 (FOURTEEN) DAYS. 2 each 3   insulin aspart (NOVOLOG FLEXPEN) 100 UNIT/ML FlexPen Inject 22-28 Units into the skin 3 (three) times daily with meals. Sliding scale 75 mL 0   Insulin Glargine (BASAGLAR KWIKPEN) 100 UNIT/ML Inject 80 Units into the skin at bedtime. 75 mL 1   Insulin Pen Needle (PEN NEEDLES) 31G X 8 MM MISC 1 each by Does not apply route 4 (four) times daily. 400 each 1   losartan (COZAAR) 50 MG tablet TAKE 1 TABLET BY MOUTH EVERY DAY (Patient taking differently: Take 50 mg by mouth daily.) 90 tablet 1   metFORMIN (GLUCOPHAGE-XR) 500 MG 24 hr tablet TAKE 1 TABLET BY MOUTH TWICE A DAY (Patient taking differently: Take 500 mg by mouth 2 (two) times daily.) 180 tablet 0   nitroGLYCERIN (NITROSTAT) 0.4 MG SL tablet Place 1 tablet (0.4 mg total) under the tongue every 5 (five) minutes x 3 doses as needed for chest pain. 25 tablet 1   ticagrelor (BRILINTA) 60 MG TABS tablet Take 1 tablet (60 mg total) by mouth 2 (two) times daily. 60 tablet 6   Omega-3 1000 MG CAPS Take 1,000 mg by mouth daily. (Patient not taking: Reported on 06/12/2021)     No current facility-administered medications for this visit.     Past Medical History:  Diagnosis Date   CAD (coronary artery disease), native coronary artery    10/18 PCI/DES  to mRCA, and OM, with total occlusion of dLCx with collaterals.    Essential hypertension    GERD (gastroesophageal reflux disease)    History of kidney stones    Hyperlipemia    SVT (supraventricular tachycardia) ([place]) 2015   Converted with adenosine   Type 2 diabetes mellitus ([place])     ROS:   All systems reviewed and negative except as noted in the HPI.   Past Surgical History:  Procedure Laterality Date   CARDIAC SURGERY     CORONARY ANGIOPLASTY WITH STENT PLACEMENT  07/15/2017   "2 stents"   CORONARY/GRAFT ACUTE MI REVASCULARIZATION N/A 03/12/2019   Procedure: Coronary/Graft Acute MI Revascularization;  Surgeon: Nelva Bush, MD;   Location: [place] [facility];  Service: Cardiovascular;  Laterality: N/A;   LAPAROSCOPIC APPENDECTOMY N/A 11/12/2017   Procedure: APPENDECTOMY LAPAROSCOPIC, REPAIR OF INCARCERATED INCISIONAL HERNIA;  Surgeon: Michael Boston, MD;  Location: [facility];  Service: General;  Laterality: N/A;   LAPAROSCOPIC CHOLECYSTECTOMY     LEFT HEART CATH AND CORONARY ANGIOGRAPHY N/A 07/15/2017   Procedure: LEFT HEART CATH AND CORONARY ANGIOGRAPHY;  Surgeon: Sherren Mocha, MD;  Location: [place] [facility];  Service: Cardiovascular;  Laterality: N/A;   LEFT HEART CATH AND CORONARY ANGIOGRAPHY N/A 03/12/2019   Procedure: LEFT HEART CATH AND CORONARY ANGIOGRAPHY;  Surgeon: Nelva Bush, MD;  Location: [facility];  Service: Cardiovascular;  Laterality: N/A;   SVT ABLATION N/A 04/22/2021   Procedure: SVT ABLATION;  Surgeon: Evans Lance, MD;  Location: [facility];  Service: Cardiovascular;  Laterality: N/A;     Family History  Problem Relation Age of Onset   Heart disease Mother 61       CABG   Diabetes Mother    Gout Mother    Heart attack Father 6     Social History   Socioeconomic History   Marital status: Married    Spouse name: Not on file   Number of children: Not on file   Years of education: Not on file   Highest education level: Not on file  Occupational History   Occupation: Superintendent    Employer: [employer]  Tobacco Use   Smoking status: Light Smoker    Packs/day: 0.50    Years: 26.00    Pack years: 13.00    Types: Cigarettes    Last attempt to quit: 03/07/2019    Years since quitting: 2.2   Smokeless tobacco: Never  Vaping Use   Vaping Use: Never used  Substance and Sexual Activity   Alcohol use: No   Drug use: No   Sexual activity: Yes    Birth control/protection: None  Other Topics Concern   Not on file  Social History Narrative   Not on file   Social Determinants of Health   Financial Resource Strain: Not on file  Food Insecurity: Not on  file  Transportation Needs: Not on file  Physical Activity: Not on file  Stress: Not on file  Social Connections: Not on file  Intimate Partner Violence: Not on file     Ht '5\' 11"'  (1.803 m)   Wt 297 lb (134.7 kg)   BMI 41.42 kg/m   Physical Exam:  overweight appearing NAD HEENT: Unremarkable Neck:  No JVD, no thyromegally Lymphatics:  No adenopathy Back:  No CVA tenderness Lungs:  Clear with no wheezes HEART:  Regular rate rhythm, no murmurs, no rubs, no clicks Abd:  soft, positive bowel sounds, no organomegally, no rebound, no  guarding Ext:  2 plus pulses, no edema, no cyanosis, no clubbing Skin:  No rashes no nodules Neuro:  CN II through XII intact, motor grossly intact  EKG - nsr  Assess/Plan:  SVT - he is s/p EPS/RFA of AVNRT and is doing well with no recurrent SVT Obesity - he is encouraged to work on weight loss. His weight is down 2 lbs since his ablation. HTN - His bp is up. He notes that his bp was best with lisinopril. He cannot tolerated more than 3.125 mg daily of coreg. We will stop coreg and losartan and restart his lisinopril. He will return for labs and a bp check nurse visit. CAD - He denies anginal symptoms. We will follow. He is encouraged to increase his physical activity.  Carleene Overlie Elza Sortor,MD

## 2021-06-15 ENCOUNTER — Other Ambulatory Visit: Payer: Self-pay | Admitting: Cardiology

## 2021-06-26 ENCOUNTER — Other Ambulatory Visit: Payer: Self-pay

## 2021-06-26 ENCOUNTER — Ambulatory Visit (INDEPENDENT_AMBULATORY_CARE_PROVIDER_SITE_OTHER): Payer: 59

## 2021-06-26 ENCOUNTER — Other Ambulatory Visit (HOSPITAL_COMMUNITY)
Admission: RE | Admit: 2021-06-26 | Discharge: 2021-06-26 | Disposition: A | Discharge status: Home / Self Care | Payer: 59 | Source: Ambulatory Visit | Attending: Internal Medicine | Admitting: Internal Medicine

## 2021-06-26 VITALS — BP 148/72 | HR 92 | Ht 71.0 in | Wt 294.0 lb

## 2021-06-26 DIAGNOSIS — Z006 Encounter for examination for normal comparison and control in clinical research program: Secondary | ICD-10-CM

## 2021-06-26 DIAGNOSIS — I10 Essential (primary) hypertension: Secondary | ICD-10-CM

## 2021-06-26 DIAGNOSIS — Z79899 Other long term (current) drug therapy: Secondary | ICD-10-CM | POA: Insufficient documentation

## 2021-06-26 LAB — BASIC METABOLIC PANEL
Anion gap: 8 (ref 5–15)
BUN: 13 mg/dL (ref 6–20)
CO2: 28 mmol/L (ref 22–32)
Calcium: 9.3 mg/dL (ref 8.9–10.3)
Chloride: 104 mmol/L (ref 98–111)
Creatinine, Ser: 1 mg/dL (ref 0.61–1.24)
GFR, Estimated: >60 mL/min (ref >60)
Glucose, Bld: 234 mg/dL — ABNORMAL HIGH (ref 70–99)
Potassium: 4.8 mmol/L (ref 3.5–5.1)
Sodium: 140 mmol/L (ref 135–145)

## 2021-06-26 NOTE — Research (Signed)
[employer] Research Study  4 Year Follow-up  Patient doing well at this time, no adverse events to report since last follow-up. SVT ablation was successful in July. Medication changes recently were stopping Coreg and Losartan and starting Lisinopril 40 MG QD.    Current Outpatient Medications:    BRILINTA 60 MG TABS tablet, TAKE 1 TABLET BY MOUTH 2 TIMES DAILY., Disp: 180 tablet, Rfl: 2   ALPRAZolam (XANAX) 1 MG tablet, Take 1 mg by mouth daily as needed for anxiety., Disp: , Rfl:    aspirin EC 81 MG tablet, Take 1 tablet (81 mg total) by mouth daily with breakfast., Disp: 120 tablet, Rfl: 5   atorvastatin (LIPITOR) 80 MG tablet, TAKE 1 TABLET BY MOUTH EVERYDAY AT BEDTIME (Patient taking differently: Take 80 mg by mouth daily.), Disp: 90 tablet, Rfl: 1   blood glucose meter kit and supplies KIT, 1 each by Does not apply route 4 (four) times daily. Dispense based on patient and insurance preference. Use up to four times daily as directed. (FOR ICD-9 250.00, 250.01)., Disp: 1 each, Rfl: 0   Continuous Blood Gluc Receiver (FREESTYLE LIBRE 2 READER) DEVI, As directed, Disp: 1 each, Rfl: 0   Continuous Blood Gluc Sensor (FREESTYLE LIBRE 2 SENSOR) MISC, 1 PIECE BY DOES NOT APPLY ROUTE EVERY 14 (FOURTEEN) DAYS., Disp: 2 each, Rfl: 3   insulin aspart (NOVOLOG FLEXPEN) 100 UNIT/ML FlexPen, Inject 22-28 Units into the skin 3 (three) times daily with meals. Sliding scale, Disp: 75 mL, Rfl: 0   Insulin Glargine (BASAGLAR KWIKPEN) 100 UNIT/ML, Inject 80 Units into the skin at bedtime., Disp: 75 mL, Rfl: 1   Insulin Pen Needle (PEN NEEDLES) 31G X 8 MM MISC, 1 each by Does not apply route 4 (four) times daily., Disp: 400 each, Rfl: 1   lisinopril (ZESTRIL) 40 MG tablet, Take 1 tablet (40 mg total) by mouth daily., Disp: 90 tablet, Rfl: 3   metFORMIN (GLUCOPHAGE-XR) 500 MG 24 hr tablet, TAKE 1 TABLET BY MOUTH TWICE A DAY (Patient taking differently: Take 500 mg by mouth 2 (two) times daily.), Disp: 180 tablet, Rfl:  0   nitroGLYCERIN (NITROSTAT) 0.4 MG SL tablet, Place 1 tablet (0.4 mg total) under the tongue every 5 (five) minutes x 3 doses as needed for chest pain., Disp: 25 tablet, Rfl: 1   Omega-3 1000 MG CAPS, Take 1,000 mg by mouth daily. (Patient not taking: Reported on 06/12/2021), Disp: , Rfl:

## 2021-06-26 NOTE — Progress Notes (Signed)
Patient is here for a bp check. Pt denies any CP/SOB/pressure/nausea/dizziness.

## 2021-06-27 LAB — HEMOGLOBIN A1C
Hgb A1c MFr Bld: 8.9 % — ABNORMAL HIGH (ref 4.8–5.6)
Mean Plasma Glucose: 209 mg/dL

## 2021-07-06 ENCOUNTER — Other Ambulatory Visit: Payer: Self-pay | Admitting: "Endocrinology

## 2021-07-08 NOTE — Telephone Encounter (Signed)
Last OV note from 03/20/2021 states to take Metformin 500 mg XR po once daily.  Next OV 07/23/2021  Last filled 04/09/2021 # 180 with 0 refills Instructions to take 1 tablet po twice a day.  Can you clarify the correct dosage?

## 2021-07-23 ENCOUNTER — Ambulatory Visit (INDEPENDENT_AMBULATORY_CARE_PROVIDER_SITE_OTHER): Payer: 59 | Admitting: "Endocrinology

## 2021-07-23 ENCOUNTER — Encounter: Payer: Self-pay | Admitting: "Endocrinology

## 2021-07-23 ENCOUNTER — Other Ambulatory Visit: Payer: Self-pay

## 2021-07-23 VITALS — BP 136/72 | HR 88 | Ht 71.0 in | Wt 298.0 lb

## 2021-07-23 DIAGNOSIS — E1159 Type 2 diabetes mellitus with other circulatory complications: Secondary | ICD-10-CM

## 2021-07-23 DIAGNOSIS — E1165 Type 2 diabetes mellitus with hyperglycemia: Secondary | ICD-10-CM | POA: Diagnosis not present

## 2021-07-23 DIAGNOSIS — I10 Essential (primary) hypertension: Secondary | ICD-10-CM | POA: Diagnosis not present

## 2021-07-23 DIAGNOSIS — E782 Mixed hyperlipidemia: Secondary | ICD-10-CM | POA: Diagnosis not present

## 2021-07-23 MED ORDER — NOVOLOG FLEXPEN 100 UNIT/ML ~~LOC~~ SOPN: 24.0000 [IU] | PEN_INJECTOR | Freq: Three times a day (TID) | SUBCUTANEOUS | 1 refills | Status: DC

## 2021-07-23 NOTE — Patient Instructions (Signed)

## 2021-07-23 NOTE — Progress Notes (Signed)
03/20/2021  Endocrinology follow-up note  Subjective:    Patient ID: Lance [name], male   DOB: 01-09-1976.  Lance [name] is being seen in follow-up for management of currently uncontrolled type 2 diabetes, complicated by coronary artery disease recently which required stent placement, hyperlipidemia, hypertension.   Past Medical History:  Diagnosis Date   CAD (coronary artery disease), native coronary artery    10/18 PCI/DES to mRCA, and OM, with total occlusion of dLCx with collaterals.    Essential hypertension    GERD (gastroesophageal reflux disease)    History of kidney stones    Hyperlipemia    SVT (supraventricular tachycardia) ([place]) 2015   Converted with adenosine   Type 2 diabetes mellitus ([place])    Past Surgical History:  Procedure Laterality Date   CARDIAC SURGERY     CORONARY ANGIOPLASTY WITH STENT PLACEMENT  07/15/2017   "2 stents"   CORONARY/GRAFT ACUTE MI REVASCULARIZATION N/A 03/12/2019   Procedure: Coronary/Graft Acute MI Revascularization;  Surgeon: Nelva Bush, MD;  Location: [facility];  Service: Cardiovascular;  Laterality: N/A;   LAPAROSCOPIC APPENDECTOMY N/A 11/12/2017   Procedure: APPENDECTOMY LAPAROSCOPIC, REPAIR OF INCARCERATED INCISIONAL HERNIA;  Surgeon: Michael Boston, MD;  Location: [facility];  Service: General;  Laterality: N/A;   LAPAROSCOPIC CHOLECYSTECTOMY     LEFT HEART CATH AND CORONARY ANGIOGRAPHY N/A 07/15/2017   Procedure: LEFT HEART CATH AND CORONARY ANGIOGRAPHY;  Surgeon: Sherren Mocha, MD;  Location: [facility];  Service: Cardiovascular;  Laterality: N/A;   LEFT HEART CATH AND CORONARY ANGIOGRAPHY N/A 03/12/2019   Procedure: LEFT HEART CATH AND CORONARY ANGIOGRAPHY;  Surgeon: Nelva Bush, MD;  Location: [facility];  Service: Cardiovascular;  Laterality: N/A;   Social History   Socioeconomic History   Marital status: Married    Spouse name: Not on file   Number of children: Not on file   Years of education: Not on  file   Highest education level: Not on file  Occupational History   Occupation: Superintendent    Employer: [employer]  Tobacco Use   Smoking status: Light Smoker    Packs/day: 0.50    Years: 26.00    Pack years: 13.00    Types: Cigarettes    Last attempt to quit: 03/07/2019    Years since quitting: 2.0   Smokeless tobacco: Never  Vaping Use   Vaping Use: Never used  Substance and Sexual Activity   Alcohol use: No   Drug use: No   Sexual activity: Yes    Birth control/protection: None  Other Topics Concern   Not on file  Social History Narrative   Not on file   Social Determinants of Health   Financial Resource Strain: Not on file  Food Insecurity: Not on file  Transportation Needs: Not on file  Physical Activity: Not on file  Stress: Not on file  Social Connections: Not on file   Outpatient Encounter Medications as of 03/20/2021  Medication Sig   ALPRAZolam (XANAX) 1 MG tablet Take 1 mg by mouth daily as needed for anxiety.   aspirin EC 81 MG tablet Take 1 tablet (81 mg total) by mouth daily with breakfast.   atorvastatin (LIPITOR) 80 MG tablet TAKE 1 TABLET BY MOUTH EVERYDAY AT BEDTIME   blood glucose meter kit and supplies KIT 1 each by Does not apply route 4 (four) times daily. Dispense based on patient and insurance preference. Use up to four times daily as directed. (FOR ICD-9 250.00, 250.01).  carvedilol (COREG) 3.125 MG tablet TAKE 1 TABLET (3.125 MG TOTAL) BY MOUTH 2 (TWO) TIMES DAILY WITH A MEAL.   Continuous Blood Gluc Receiver (FREESTYLE LIBRE 2 READER) DEVI As directed   Continuous Blood Gluc Sensor (FREESTYLE LIBRE 2 SENSOR) MISC 1 Piece by Does not apply route every 14 (fourteen) days.   glipiZIDE (GLUCOTROL XL) 5 MG 24 hr tablet TAKE 1 TABLET BY MOUTH EVERY DAY WITH BREAKFAST (Patient not taking: Reported on 03/20/2021)   insulin aspart (NOVOLOG FLEXPEN) 100 UNIT/ML FlexPen Inject 22-28 Units into the skin 3 (three) times daily with meals.   Insulin  Glargine (BASAGLAR KWIKPEN) 100 UNIT/ML INJECT 80 UNITS INTO THE SKIN AT BEDTIME.   Insulin Pen Needle (PEN NEEDLES) 31G X 8 MM MISC 1 each by Does not apply route 4 (four) times daily.   losartan (COZAAR) 50 MG tablet Take 1 tablet (50 mg total) by mouth daily.   metFORMIN (GLUCOPHAGE-XR) 500 MG 24 hr tablet Take 1 tablet (500 mg total) by mouth daily with breakfast.   nitroGLYCERIN (NITROSTAT) 0.4 MG SL tablet Place 1 tablet (0.4 mg total) under the tongue every 5 (five) minutes x 3 doses as needed for chest pain.   Omega-3 1000 MG CAPS Take 1,000 mg by mouth 2 (two) times daily.   ticagrelor (BRILINTA) 60 MG TABS tablet Take 1 tablet (60 mg total) by mouth 2 (two) times daily.   [DISCONTINUED] metFORMIN (GLUCOPHAGE-XR) 500 MG 24 hr tablet TAKE 1 TABLET BY MOUTH TWICE A DAY   No facility-administered encounter medications on file as of 03/20/2021.   ALLERGIES: Allergies  Allergen Reactions   Bee Venom Anaphylaxis and Swelling   Penicillins Hives, Itching and Rash    Has patient had a PCN reaction causing immediate rash, facial/tongue/throat swelling, SOB or lightheadedness with hypotension: No Has patient had a PCN reaction causing severe rash involving mucus membranes or skin necrosis: No Has patient had a PCN reaction that required hospitalization: No Has patient had a PCN reaction occurring within the last 10 years: No If all of the above answers are "NO", then may proceed with Cephalosporin use.    VACCINATION STATUS:  There is no immunization history on file for this patient.  Diabetes He presents for his follow-up diabetic visit. He has type 2 diabetes mellitus. Onset time: He was diagnosed at approximate age of 56 years. His disease course has been worsening (Was recently hospitalized due to appendicitis after 2 months of antibiotic treatment.  He is status post appendectomy.  ). There are no hypoglycemic associated symptoms. Pertinent negatives for hypoglycemia include no  confusion, headaches, pallor or seizures. Pertinent negatives for diabetes include no blurred vision, no chest pain, no fatigue, no polydipsia, no polyphagia, no polyuria and no weakness. There are no hypoglycemic complications. Symptoms are worsening. There are no diabetic complications. Risk factors for coronary artery disease include diabetes mellitus, dyslipidemia, family history, hypertension, male sex, obesity, tobacco exposure and sedentary lifestyle. Current diabetic treatment includes intensive insulin program (He is on Levemir 68 units BID and Humalog 10 units TIDAC.). His weight is fluctuating minimally. He is following a generally unhealthy diet. When asked about meal planning, he reported none. He has not had a previous visit with a dietitian. He rarely participates in exercise. His home blood glucose trend is increasing steadily. His breakfast blood glucose range is generally 180-200 mg/dl. His lunch blood glucose range is generally 180-200 mg/dl. His dinner blood glucose range is generally 180-200 mg/dl. His bedtime blood glucose range  is generally 180-200 mg/dl. His overall blood glucose range is 180-200 mg/dl. (He has recently received a CGM device.  Lifestyle Libre device AGP report shows still significant problem with his glycemic profile, 43% time range, 57% above range.  He cannot document or report any hypoglycemia.  His previsit labs show A1c of 8.9%.) An ACE inhibitor/angiotensin II receptor blocker is being taken. He does not see a podiatrist.Eye exam is not current.  Hyperlipidemia This is a chronic problem. The current episode started more than 1 year ago. The problem is controlled. Exacerbating diseases include diabetes and obesity. Pertinent negatives include no chest pain, myalgias or shortness of breath. Current antihyperlipidemic treatment includes statins and bile acid squestrants. The current treatment provides significant improvement of lipids. Risk factors for coronary artery  disease include diabetes mellitus, dyslipidemia, hypertension, male sex, obesity and a sedentary lifestyle.  Hypertension This is a chronic problem. The current episode started more than 1 year ago. Pertinent negatives include no blurred vision, chest pain, headaches, neck pain, palpitations or shortness of breath. Risk factors for coronary artery disease include dyslipidemia, diabetes mellitus, male gender, smoking/tobacco exposure and sedentary lifestyle. Past treatments include ACE inhibitors. Hypertensive end-organ damage includes CAD/MI.  Review of systems  Constitutional: + Minimally fluctuating body weight,  current  Body mass index is 41.56 kg/m. , no fatigue, no subjective hyperthermia, no subjective hypothermia    Objective:    BP (!) 150/75   Pulse 78   Ht _0  (1.803 m)   Wt 298 lb (135.2 kg)   BMI 41.56 kg/m   Wt Readings from Last 3 Encounters:  03/20/21 298 lb (135.2 kg)  03/09/21 295 lb (133.8 kg)  02/08/21 295 lb (133.8 kg)      Physical Exam- Limited  Constitutional:  Body mass index is 41.56 kg/m. , not in acute distress  CMP ( most recent) CMP     Component Value Date/Time   NA 136 03/09/2021 1803   NA 140 12/10/2020 0802   K 3.7 03/09/2021 1803   CL 103 03/09/2021 1803   CO2 25 03/09/2021 1803   GLUCOSE 234 (H) 03/09/2021 1803   BUN 14 03/09/2021 1803   BUN 13 12/10/2020 0802   CREATININE 1.10 03/09/2021 1803   CREATININE 0.91 04/30/2020 0728   CALCIUM 9.0 03/09/2021 1803   PROT 7.1 03/09/2021 1803   PROT 6.7 12/10/2020 0802   ALBUMIN 3.9 03/09/2021 1803   ALBUMIN 4.3 12/10/2020 0802   AST 34 03/09/2021 1803   ALT 46 (H) 03/09/2021 1803   ALKPHOS 96 03/09/2021 1803   BILITOT 1.0 03/09/2021 1803   BILITOT 0.8 12/10/2020 0802   GFRNONAA >60 03/09/2021 1803   GFRNONAA 102 04/30/2020 0728   GFRAA 118 04/30/2020 0728     Diabetic Labs (most recent): Lab Results  Component Value Date   HGBA1C 8.6 (A) 03/20/2021   HGBA1C 10.0 (A)  12/13/2020   HGBA1C 11.9 09/05/2020    Lipid Panel     Component Value Date/Time   CHOL 115 12/10/2020 0802   TRIG 287 (H) 12/10/2020 0802   HDL 20 (L) 12/10/2020 0802   CHOLHDL 5.8 (H) 12/10/2020 0802   CHOLHDL 5.5 (H) 10/11/2019 0856   VLDL 47 (H) 03/13/2019 0547   LDLCALC 50 12/10/2020 0802   LDLCALC 63 10/11/2019 0856     Assessment & Plan:   1. Uncontrolled type 2 diabetes mellitus with chronic kidney disease, coronary artery disease, with long-term current use of insulin.  He has recently received a  CGM device.  Lifestyle Libre device AGP report shows still significant problem with his glycemic profile, 43% time range, 57% above range.  He cannot document or report any hypoglycemia.  His previsit labs show A1c of 8.9%.    Recent labs reviewed. -his diabetes is complicated by obesity/sedentary life, chronic heavy smoking and Lance [name] remains at a high risk for more acute and chronic complications which include CAD, CVA, CKD, retinopathy, and neuropathy. These are all discussed in detail with the patient.  - I have counseled him on diet management and weight loss, by adopting a carbohydrate restricted/protein rich diet.  - he acknowledges that there is a room for improvement in his food and drink choices. - Suggestion is made for him to avoid simple carbohydrates  from his diet including Cakes, Sweet Desserts, Ice Cream, Soda (diet and regular), Sweet Tea, Candies, Chips, Cookies, Store Bought Juices, Alcohol in Excess of  1-2 drinks a day, Artificial Sweeteners,  Coffee Creamer, and "Sugar-free" Products, Lemonade. This will help patient to have more stable blood glucose profile and potentially avoid unintended weight gain.   - I encouraged him to switch to  unprocessed or minimally processed complex starch and increased protein intake (animal or plant source), fruits, and vegetables.  - he is advised to stick to a routine mealtimes to eat 3 meals  a day and avoid  unnecessary snacks ( to snack only to correct hypoglycemia).   - I have approached him with the following individualized plan to manage diabetes and patient agrees:   -He will continue to require intensive treatment with basal/bolus insulin in order for him to achieve and maintain control of diabetes to target.    -In light of his presentation with with above target glycemic profile, he will need a higher dose of insulin.   -I discussed and recommended lifestyle nutrition-plant [employer] nutrition for him.    -He is advised to continue Basaglar 80 units nightly, increase NovoLog to 24-30  units 3 times daily before meals, associated with strict monitoring of blood glucose 4 times a day-before meals and at bedtime . -He is advised to use his CGM at all times, especially monitoring before meals and at bedtime.  -Patient is encouraged to call clinic for blood glucose levels less than 70 or above 200 mg /dl.  -He is advised to continue with Metformin 500 mg XR p.o. once daily. -He declined offer for weekly incretin injection therapy. -He is benefiting from low-dose glipizide.  He is advised to continue glipizide 5 mg XL p.o. daily at breakfast .  - Patient specific target  A1c;  LDL, HDL, Triglycerides,  were discussed in detail.  2) BP/HTN: -His blood pressure is controlled to target.   He is advised to continue  with his current blood pressure medications including lisinopril 40 mg p.o. daily along with his carvedilol 3.125 mg p.o. twice daily.    3) Lipids: His recent lipid panel showed improving LDL to 50.  He is advised to continue Lipitor 80 mg p.o. nightly, omega-3 fatty acids 1000 mg p.o. twice daily.     4)  Weight/Diet: His BMI is 07.86-LJQGBEE complicating his diabetes care.  He is a candidate for major weight loss.  CDE Consult has been initiated  , exercise, and detailed carbohydrates information provided.  He is an ideal candidate for bariatric surgery.  He declined bariatric  surgery as an option for weight control.    5) Chronic Care/Health Maintenance:  -he  is  on ACEI/ARB and Statin medications and  is encouraged to continue to follow up with Ophthalmology, Dentist,  Podiatrist at least yearly or according to recommendations, and advised to  quit smoking. I have recommended yearly flu vaccine and pneumonia vaccination at least every 5 years; moderate intensity exercise for up to 150 minutes weekly; and  sleep for at least 7 hours a day.  - I advised patient to maintain close follow up with Lanelle Bal, PA-C for primary care needs.     I spent 35 minutes in the care of the patient today including review of labs from [facility], Lipids, Thyroid Function, Hematology (current and previous including abstractions from other facilities); face-to-face time discussing  his blood glucose readings/logs, discussing hypoglycemia and hyperglycemia episodes and symptoms, medications doses, his options of short and long term treatment based on the latest standards of care / guidelines;  discussion about incorporating lifestyle medicine;  and documenting the encounter.    Please refer to Patient Instructions for Blood Glucose Monitoring and Insulin/Medications Dosing Guide"  in media tab for additional information. Please  also refer to " Patient Self Inventory" in the Media  tab for reviewed elements of pertinent patient history.  Lance [name] participated in the discussions, expressed understanding, and voiced agreement with the above plans.  All questions were answered to his satisfaction. he is encouraged to contact clinic should he have any questions or concerns prior to his return visit.   Follow up plan: - Return in about 4 months (around 07/20/2021) for Bring Meter and Logs- A1c in Office.  Glade Lloyd, MD Phone: [phone]  Fax: [phone]  -  This note was partially dictated with voice recognition software. Similar sounding words can be transcribed inadequately or may  not  be corrected upon review.  03/20/2021, 6:07 PM

## 2021-07-24 ENCOUNTER — Other Ambulatory Visit: Payer: Self-pay | Admitting: "Endocrinology

## 2021-07-25 ENCOUNTER — Telehealth: Payer: Self-pay | Admitting: Internal Medicine

## 2021-07-25 NOTE — Telephone Encounter (Signed)
   Primary Cardiologist: Nona Dell, MD  Chart reviewed as part of pre-operative protocol coverage. Simple dental extractions are considered low risk procedures per guidelines and generally do not require any specific cardiac clearance. It is also generally accepted that for simple extractions and dental cleanings, there is no need to interrupt blood thinner therapy.   SBE prophylaxis is not required for the patient.  I will route this recommendation to the requesting party via Epic fax function and remove from pre-op pool.  Please call with questions.  Ronney Asters, NP 07/25/2021, 3:19 PM

## 2021-07-25 NOTE — Telephone Encounter (Signed)
"  What dental office are you calling from? Marcelle Smiling ,DDS   What is your office phone number? [phone]   What is your fax number?[phone]  What type of procedure is the patient having performed? 2 teeth extraction   What date is procedure scheduled or is the patient there now? TODAY     What is your question (ex. Antibiotics prior to procedure, holding medication-we need to know how long dentist wants pt to hold med)? Patient states that he is on Brilinta 60 mg. Dr. Freddi Starr has patient in the chair.    (If the patient is currently at the dentist's office, call Pre-Op APP to address. If the patient is not currently in the dentist office, please route to the Pre-Op pool)

## 2021-08-30 ENCOUNTER — Other Ambulatory Visit: Payer: Self-pay | Admitting: Nurse Practitioner

## 2021-09-02 ENCOUNTER — Encounter: Payer: Self-pay | Admitting: Cardiology

## 2021-09-02 ENCOUNTER — Ambulatory Visit: Payer: 59 | Admitting: Cardiology

## 2021-09-02 VITALS — BP 158/82 | HR 88 | Ht 71.0 in | Wt 296.0 lb

## 2021-09-02 DIAGNOSIS — I25119 Atherosclerotic heart disease of native coronary artery with unspecified angina pectoris: Secondary | ICD-10-CM | POA: Diagnosis not present

## 2021-09-02 DIAGNOSIS — I471 Supraventricular tachycardia: Secondary | ICD-10-CM | POA: Diagnosis not present

## 2021-09-02 DIAGNOSIS — E782 Mixed hyperlipidemia: Secondary | ICD-10-CM

## 2021-09-02 MED ORDER — NITROGLYCERIN 0.4 MG SL SUBL: 0.4000 mg | SUBLINGUAL_TABLET | SUBLINGUAL | 3 refills | Status: DC | PRN

## 2021-09-02 NOTE — Patient Instructions (Signed)

## 2021-09-02 NOTE — Progress Notes (Signed)
Cardiology Office Note  Date: 09/02/2021   ID: Lance [name], DOB [birth year], MRN [id number]  PCP:  Lanelle Bal, PA-C  Cardiologist:  Rozann Lesches, MD Electrophysiologist:  None   Chief Complaint  Patient presents with   Cardiac follow-up    History of Present Illness: Lance [name] is a 45 y.o. male last seen in February.  He has had interval follow-up with Dr. Lovena Le, last seen in September.  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  He presents for a routine visit.  Reports no progressive palpitations, no angina symptoms or nitroglycerin use.  I reviewed his medications which are otherwise stable and outlined below.  His last LDL looked good at 50.  Past Medical History:  Diagnosis Date   CAD (coronary artery disease), native coronary artery    10/18 PCI/DES to mRCA, and OM, with total occlusion of dLCx with collaterals.    Essential hypertension    GERD (gastroesophageal reflux disease)    History of kidney stones    Hyperlipemia    SVT (supraventricular tachycardia) ([place]) 2015   Converted with adenosine   Type 2 diabetes mellitus ([place])     Past Surgical History:  Procedure Laterality Date   CARDIAC SURGERY     CORONARY ANGIOPLASTY WITH STENT PLACEMENT  07/15/2017   "2 stents"   CORONARY/GRAFT ACUTE MI REVASCULARIZATION N/A 03/12/2019   Procedure: Coronary/Graft Acute MI Revascularization;  Surgeon: Nelva Bush, MD;  Location: [facility];  Service: Cardiovascular;  Laterality: N/A;   LAPAROSCOPIC APPENDECTOMY N/A 11/12/2017   Procedure: APPENDECTOMY LAPAROSCOPIC, REPAIR OF INCARCERATED INCISIONAL HERNIA;  Surgeon: Michael Boston, MD;  Location: [facility];  Service: General;  Laterality: N/A;   LAPAROSCOPIC CHOLECYSTECTOMY     LEFT HEART CATH AND CORONARY ANGIOGRAPHY N/A 07/15/2017   Procedure: LEFT HEART CATH AND CORONARY ANGIOGRAPHY;  Surgeon: Sherren Mocha, MD;  Location: [facility];  Service:  Cardiovascular;  Laterality: N/A;   LEFT HEART CATH AND CORONARY ANGIOGRAPHY N/A 03/12/2019   Procedure: LEFT HEART CATH AND CORONARY ANGIOGRAPHY;  Surgeon: Nelva Bush, MD;  Location: [facility];  Service: Cardiovascular;  Laterality: N/A;   SVT ABLATION N/A 04/22/2021   Procedure: SVT ABLATION;  Surgeon: Evans Lance, MD;  Location: [facility];  Service: Cardiovascular;  Laterality: N/A;    Current Outpatient Medications  Medication Sig Dispense Refill   ALPRAZolam (XANAX) 1 MG tablet Take 1 mg by mouth daily as needed for anxiety.     aspirin EC 81 MG tablet Take 1 tablet (81 mg total) by mouth daily with breakfast. 120 tablet 5   atorvastatin (LIPITOR) 80 MG tablet TAKE 1 TABLET BY MOUTH EVERYDAY AT BEDTIME (Patient taking differently: Take 80 mg by mouth daily.) 90 tablet 1   blood glucose meter kit and supplies KIT 1 each by Does not apply route 4 (four) times daily. Dispense based on patient and insurance preference. Use up to four times daily as directed. (FOR ICD-9 250.00, 250.01). 1 each 0   BRILINTA 60 MG TABS tablet TAKE 1 TABLET BY MOUTH 2 TIMES DAILY. 180 tablet 2   Continuous Blood Gluc Receiver (FREESTYLE LIBRE 2 READER) DEVI As directed 1 each 0   Continuous Blood Gluc Sensor (FREESTYLE LIBRE 2 SENSOR) MISC 1 PIECE BY DOES NOT APPLY ROUTE EVERY 14 (FOURTEEN) DAYS. 2 each 3   insulin aspart (NOVOLOG FLEXPEN) 100 UNIT/ML FlexPen Inject 24-30 Units into the skin 3 (three) times  daily with meals. Sliding scale 45 mL 1   Insulin Glargine (BASAGLAR KWIKPEN) 100 UNIT/ML INJECT 80 UNITS INTO THE SKIN AT BEDTIME. 75 mL 0   Insulin Pen Needle (PEN NEEDLES) 31G X 8 MM MISC 1 each by Does not apply route 4 (four) times daily. 400 each 1   lisinopril (ZESTRIL) 40 MG tablet Take 1 tablet (40 mg total) by mouth daily. 90 tablet 3   metFORMIN (GLUCOPHAGE-XR) 500 MG 24 hr tablet Take 1 tablet (500 mg total) by mouth daily with breakfast. 90 tablet 1   Omega-3 1000 MG CAPS  Take 1,000 mg by mouth daily.     nitroGLYCERIN (NITROSTAT) 0.4 MG SL tablet Place 1 tablet (0.4 mg total) under the tongue every 5 (five) minutes x 3 doses as needed for chest pain. 25 tablet 3   No current facility-administered medications for this visit.   Allergies:  Bee venom and Penicillins   ROS: No palpitations or syncope.  Physical Exam: VS:  BP (!) 158/82   Pulse 88   Ht '5\' 11"'  (1.803 m)   Wt 296 lb (134.3 kg)   SpO2 96%   BMI 41.28 kg/m , BMI Body mass index is 41.28 kg/m.  Wt Readings from Last 3 Encounters:  09/02/21 296 lb (134.3 kg)  07/23/21 298 lb (135.2 kg)  06/26/21 294 lb (133.4 kg)    General: Patient appears comfortable at rest. HEENT: Conjunctiva and lids normal. Neck: Supple, no elevated JVP or carotid bruits, no thyromegaly. Lungs: Clear to auscultation, nonlabored breathing at rest. Cardiac: Regular rate and rhythm, no S3 or significant systolic murmur. Extremities: No pitting edema.  ECG:  An ECG dated 06/12/2021 was personally reviewed today and demonstrated:  Sinus rhythm.  Recent Labwork: 02/08/2021: B Natriuretic Peptide 22.0 03/09/2021: ALT 46; AST 34 04/18/2021: Hemoglobin 13.9; Platelets 218 06/26/2021: BUN 13; Creatinine, Ser 1.00; Potassium 4.8; Sodium 140     Component Value Date/Time   CHOL 115 12/10/2020 0802   TRIG 287 (H) 12/10/2020 0802   HDL 20 (L) 12/10/2020 0802   CHOLHDL 5.8 (H) 12/10/2020 0802   CHOLHDL 5.5 (H) 10/11/2019 0856   VLDL 47 (H) 03/13/2019 0547   LDLCALC 50 12/10/2020 0802   LDLCALC 63 10/11/2019 0856    Other Studies Reviewed Today:  Echocardiogram 07/26/2019:  1. Left ventricular ejection fraction, by visual estimation, is 65 to  70%. The left ventricle has hyperdynamic function. There is mildly  increased left ventricular posterior wall hypertrophy.   2. Elevated left ventricular end-diastolic pressure.   3. Left ventricular diastolic Doppler parameters are consistent with  impaired relaxation pattern of  LV diastolic filling.   4. Global right ventricle has normal systolic function.The right  ventricular size is normal. Right vetricular wall thickness was not  assessed.   5. Left atrial size was normal.   6. Right atrial size was normal.   7. The mitral valve is grossly normal. Mild mitral valve regurgitation.   8. The tricuspid valve is grossly normal. Tricuspid valve regurgitation  is trivial.   9. The aortic valve is tricuspid Aortic valve regurgitation was not  visualized by color flow Doppler. Mild aortic valve sclerosis without  stenosis.  10. The pulmonic valve was grossly normal. Pulmonic valve regurgitation is  not visualized by color flow Doppler.  11. Normal pulmonary artery systolic pressure.  12. The inferior vena cava IVC is small suggestive of low RAP.   Assessment and Plan:  1.  CAD status post multiple percutaneous coronary  interventions, most recently DES to the LAD and staged angioplasty of a restenosed OM in June 2020.  He is symptomatically stable on medical therapy and remains on long-term DAPT.  Continue aspirin, Brilinta, lisinopril, Lipitor, and as needed nitroglycerin which will be refilled.  2.  Mixed hyperlipidemia, tolerating Lipitor with last LDL 50.  3.  Status post successful slow pathway radiofrequency ablation for AVNRT in July with Dr. Lovena Le.  Medication Adjustments/Labs and Tests Ordered: Current medicines are reviewed at length with the patient today.  Concerns regarding medicines are outlined above.   Tests Ordered: No orders of the defined types were placed in this encounter.   Medication Changes: Meds ordered this encounter  Medications   nitroGLYCERIN (NITROSTAT) 0.4 MG SL tablet    Sig: Place 1 tablet (0.4 mg total) under the tongue every 5 (five) minutes x 3 doses as needed for chest pain.    Dispense:  25 tablet    Refill:  3     Disposition:  Follow up  6 months.  Signed, Satira Sark, MD, [facility] 09/02/2021 4:28 PM     [facility] [place], [place], [place] [address] Phone: [phone]; Fax: [phone]

## 2021-09-03 ENCOUNTER — Other Ambulatory Visit: Payer: Self-pay | Admitting: "Endocrinology

## 2021-09-03 MED ORDER — METFORMIN HCL ER 500 MG PO TB24: 500.0000 mg | ORAL_TABLET | Freq: Every day | ORAL | 1 refills | Status: DC

## 2021-09-04 ENCOUNTER — Other Ambulatory Visit: Payer: Self-pay | Admitting: "Endocrinology

## 2021-09-04 MED ORDER — METFORMIN HCL ER 500 MG PO TB24: 1000.0000 mg | ORAL_TABLET | Freq: Every day | ORAL | 1 refills | Status: DC

## 2021-09-06 ENCOUNTER — Other Ambulatory Visit: Payer: Self-pay | Admitting: Cardiology

## 2021-09-09 ENCOUNTER — Telehealth: Payer: Self-pay | Admitting: "Endocrinology

## 2021-09-09 NOTE — Telephone Encounter (Signed)
Pt states his pharmacy will faxing something over soon but he needs a PA on his libre sensor 2. Please advise

## 2021-09-09 NOTE — Telephone Encounter (Signed)
Pt stated pharmacy is supposed to fax over PA request regarding libre sensors. Advised pt as soon as we get request we will send it through to the insurance company. Pt voiced understanding.

## 2021-10-06 ENCOUNTER — Other Ambulatory Visit: Payer: Self-pay | Admitting: "Endocrinology

## 2021-10-08 ENCOUNTER — Other Ambulatory Visit: Payer: Self-pay

## 2021-10-08 ENCOUNTER — Other Ambulatory Visit: Payer: Self-pay | Admitting: Cardiology

## 2021-10-08 DIAGNOSIS — E1165 Type 2 diabetes mellitus with hyperglycemia: Secondary | ICD-10-CM

## 2021-10-08 MED ORDER — NOVOLOG FLEXPEN 100 UNIT/ML ~~LOC~~ SOPN: 24.0000 [IU] | PEN_INJECTOR | Freq: Three times a day (TID) | SUBCUTANEOUS | 1 refills | Status: DC

## 2021-10-23 ENCOUNTER — Encounter: Payer: Self-pay | Admitting: "Endocrinology

## 2021-10-23 ENCOUNTER — Other Ambulatory Visit: Payer: Self-pay

## 2021-10-23 ENCOUNTER — Ambulatory Visit (INDEPENDENT_AMBULATORY_CARE_PROVIDER_SITE_OTHER): Payer: 59 | Admitting: "Endocrinology

## 2021-10-23 VITALS — BP 134/76 | HR 88 | Ht 71.0 in | Wt 297.8 lb

## 2021-10-23 DIAGNOSIS — I10 Essential (primary) hypertension: Secondary | ICD-10-CM

## 2021-10-23 DIAGNOSIS — E782 Mixed hyperlipidemia: Secondary | ICD-10-CM

## 2021-10-23 DIAGNOSIS — E1159 Type 2 diabetes mellitus with other circulatory complications: Secondary | ICD-10-CM | POA: Insufficient documentation

## 2021-10-23 DIAGNOSIS — E118 Type 2 diabetes mellitus with unspecified complications: Secondary | ICD-10-CM | POA: Insufficient documentation

## 2021-10-23 LAB — POCT GLYCOSYLATED HEMOGLOBIN (HGB A1C): HbA1c, POC (controlled diabetic range): 8.6 % — AB (ref 0.0–7.0)

## 2021-10-23 MED ORDER — FREESTYLE LIBRE 3 SENSOR MISC: 1.0000 | 2 refills | Status: DC

## 2021-10-23 NOTE — Progress Notes (Signed)
10/23/2021  Endocrinology follow-up note  Subjective:    Patient ID: Lance [name], male   DOB: 1976-01-03.  Lance [name] is being seen in follow-up for management of currently uncontrolled type 2 diabetes, complicated by coronary artery disease recently which required stent placement, hyperlipidemia, hypertension.   Past Medical History:  Diagnosis Date   CAD (coronary artery disease), native coronary artery    10/18 PCI/DES to mRCA, and OM, with total occlusion of dLCx with collaterals.    Essential hypertension    GERD (gastroesophageal reflux disease)    History of kidney stones    Hyperlipemia    SVT (supraventricular tachycardia) ([place]) 2015   Converted with adenosine   Type 2 diabetes mellitus ([place])    Past Surgical History:  Procedure Laterality Date   CARDIAC SURGERY     CORONARY ANGIOPLASTY WITH STENT PLACEMENT  07/15/2017   "2 stents"   CORONARY/GRAFT ACUTE MI REVASCULARIZATION N/A 03/12/2019   Procedure: Coronary/Graft Acute MI Revascularization;  Surgeon: Nelva Bush, MD;  Location: [facility];  Service: Cardiovascular;  Laterality: N/A;   LAPAROSCOPIC APPENDECTOMY N/A 11/12/2017   Procedure: APPENDECTOMY LAPAROSCOPIC, REPAIR OF INCARCERATED INCISIONAL HERNIA;  Surgeon: Michael Boston, MD;  Location: [facility];  Service: General;  Laterality: N/A;   LAPAROSCOPIC CHOLECYSTECTOMY     LEFT HEART CATH AND CORONARY ANGIOGRAPHY N/A 07/15/2017   Procedure: LEFT HEART CATH AND CORONARY ANGIOGRAPHY;  Surgeon: Sherren Mocha, MD;  Location: [facility];  Service: Cardiovascular;  Laterality: N/A;   LEFT HEART CATH AND CORONARY ANGIOGRAPHY N/A 03/12/2019   Procedure: LEFT HEART CATH AND CORONARY ANGIOGRAPHY;  Surgeon: Nelva Bush, MD;  Location: [facility];  Service: Cardiovascular;  Laterality: N/A;   SVT ABLATION N/A 04/22/2021   Procedure: SVT ABLATION;  Surgeon: Evans Lance, MD;  Location: [facility];  Service: Cardiovascular;  Laterality: N/A;    Social History   Socioeconomic History   Marital status: Married    Spouse name: Not on file   Number of children: Not on file   Years of education: Not on file   Highest education level: Not on file  Occupational History   Occupation: Superintendent    Employer: [employer]  Tobacco Use   Smoking status: Light Smoker    Packs/day: 0.50    Years: 26.00    Pack years: 13.00    Types: Cigarettes    Last attempt to quit: 03/07/2019    Years since quitting: 2.6   Smokeless tobacco: Never  Vaping Use   Vaping Use: Never used  Substance and Sexual Activity   Alcohol use: No   Drug use: No   Sexual activity: Yes    Birth control/protection: None  Other Topics Concern   Not on file  Social History Narrative   Not on file   Social Determinants of Health   Financial Resource Strain: Not on file  Food Insecurity: Not on file  Transportation Needs: Not on file  Physical Activity: Not on file  Stress: Not on file  Social Connections: Not on file   Outpatient Encounter Medications as of 10/23/2021  Medication Sig   Continuous Blood Gluc Sensor (FREESTYLE LIBRE 3 SENSOR) MISC 1 Piece by Does not apply route every 14 (fourteen) days. Place 1 sensor on the skin every 14 days. Use to check glucose continuously   ALPRAZolam (XANAX) 1 MG tablet Take 1 mg by mouth daily as needed for anxiety.   aspirin EC 81 MG tablet Take  1 tablet (81 mg total) by mouth daily with breakfast.   atorvastatin (LIPITOR) 80 MG tablet TAKE 1 TABLET BY MOUTH EVERYDAY AT BEDTIME   blood glucose meter kit and supplies KIT 1 each by Does not apply route 4 (four) times daily. Dispense based on patient and insurance preference. Use up to four times daily as directed. (FOR ICD-9 250.00, 250.01).   BRILINTA 60 MG TABS tablet TAKE 1 TABLET BY MOUTH 2 TIMES DAILY.   insulin aspart (NOVOLOG FLEXPEN) 100 UNIT/ML FlexPen Inject 24-30 Units into the skin 3 (three) times daily with meals. Sliding scale   Insulin  Glargine (BASAGLAR KWIKPEN) 100 UNIT/ML INJECT 80 UNITS INTO THE SKIN AT BEDTIME.   Insulin Pen Needle (PEN NEEDLES) 31G X 8 MM MISC 1 each by Does not apply route 4 (four) times daily.   lisinopril (ZESTRIL) 40 MG tablet Take 1 tablet (40 mg total) by mouth daily.   metFORMIN (GLUCOPHAGE-XR) 500 MG 24 hr tablet Take 2 tablets (1,000 mg total) by mouth daily with breakfast.   nitroGLYCERIN (NITROSTAT) 0.4 MG SL tablet Place 1 tablet (0.4 mg total) under the tongue every 5 (five) minutes x 3 doses as needed for chest pain.   Omega-3 1000 MG CAPS Take 1,000 mg by mouth daily.   [DISCONTINUED] Continuous Blood Gluc Receiver (FREESTYLE LIBRE 2 READER) DEVI As directed   [DISCONTINUED] Continuous Blood Gluc Sensor (FREESTYLE LIBRE 2 SENSOR) MISC 1 PIECE INTRADERMALLY EVERY 14 DAYS   No facility-administered encounter medications on file as of 10/23/2021.   ALLERGIES: Allergies  Allergen Reactions   Bee Venom Anaphylaxis and Swelling   Penicillins Hives, Itching and Rash    Has patient had a PCN reaction causing immediate rash, facial/tongue/throat swelling, SOB or lightheadedness with hypotension: No Has patient had a PCN reaction causing severe rash involving mucus membranes or skin necrosis: No Has patient had a PCN reaction that required hospitalization: No Has patient had a PCN reaction occurring within the last 10 years: No If all of the above answers are "NO", then may proceed with Cephalosporin use.    VACCINATION STATUS:  There is no immunization history on file for this patient.  Diabetes He presents for his follow-up diabetic visit. He has type 2 diabetes mellitus. Onset time: He was diagnosed at approximate age of 41 years. His disease course has been improving (Was recently hospitalized due to appendicitis after 2 months of antibiotic treatment.  He is status post appendectomy.  ). There are no hypoglycemic associated symptoms. Pertinent negatives for hypoglycemia include no  confusion, headaches, pallor or seizures. Pertinent negatives for diabetes include no blurred vision, no chest pain, no fatigue, no polydipsia, no polyphagia, no polyuria and no weakness. There are no hypoglycemic complications. Symptoms are improving. There are no diabetic complications. Risk factors for coronary artery disease include diabetes mellitus, dyslipidemia, family history, hypertension, male sex, obesity, tobacco exposure and sedentary lifestyle. Current diabetic treatment includes intensive insulin program (He is on Levemir 68 units BID and Humalog 10 units TIDAC.). His weight is fluctuating minimally. He is following a generally unhealthy diet. When asked about meal planning, he reported none. He has not had a previous visit with a dietitian. He rarely participates in exercise. His home blood glucose trend is decreasing steadily. His breakfast blood glucose range is generally 140-180 mg/dl. His lunch blood glucose range is generally 140-180 mg/dl. His dinner blood glucose range is generally 140-180 mg/dl. His bedtime blood glucose range is generally 140-180 mg/dl. His overall blood glucose  range is 140-180 mg/dl. Larkin Ina presents with improved glycemic profile.  His CGM AGP shows 58% time range, 42% above range.  No hypoglycemia.  His point-of-care A1c is 8.6 percent generally improving.  His most recent 2 weeks average blood glucose is 172.  ) An ACE inhibitor/angiotensin II receptor blocker is being taken. He does not see a podiatrist.Eye exam is not current.  Hyperlipidemia This is a chronic problem. The current episode started more than 1 year ago. The problem is controlled. Exacerbating diseases include diabetes and obesity. Pertinent negatives include no chest pain, myalgias or shortness of breath. Current antihyperlipidemic treatment includes statins and bile acid squestrants. The current treatment provides significant improvement of lipids. Risk factors for coronary artery disease include  diabetes mellitus, dyslipidemia, hypertension, male sex, obesity and a sedentary lifestyle.  Hypertension This is a chronic problem. The current episode started more than 1 year ago. Pertinent negatives include no blurred vision, chest pain, headaches, neck pain, palpitations or shortness of breath. Risk factors for coronary artery disease include dyslipidemia, diabetes mellitus, male gender, smoking/tobacco exposure and sedentary lifestyle. Past treatments include ACE inhibitors. Hypertensive end-organ damage includes CAD/MI.  Review of systems  Constitutional: + Minimally fluctuating body weight,  current  Body mass index is 41.53 kg/m. , no fatigue, no subjective hyperthermia, no subjective hypothermia    Objective:    BP 134/76    Pulse 88    Ht 5' 11"  (1.803 m)    Wt 297 lb 12.8 oz (135.1 kg)    BMI 41.53 kg/m   Wt Readings from Last 3 Encounters:  10/23/21 297 lb 12.8 oz (135.1 kg)  09/02/21 296 lb (134.3 kg)  07/23/21 298 lb (135.2 kg)      Physical Exam- Limited  Constitutional:  Body mass index is 41.53 kg/m. , not in acute distress  CMP ( most recent) CMP     Component Value Date/Time   NA 140 06/26/2021 1529   NA 140 12/10/2020 0802   K 4.8 06/26/2021 1529   CL 104 06/26/2021 1529   CO2 28 06/26/2021 1529   GLUCOSE 234 (H) 06/26/2021 1529   BUN 13 06/26/2021 1529   BUN 13 12/10/2020 0802   CREATININE 1.00 06/26/2021 1529   CREATININE 0.91 04/30/2020 0728   CALCIUM 9.3 06/26/2021 1529   PROT 7.1 03/09/2021 1803   PROT 6.7 12/10/2020 0802   ALBUMIN 3.9 03/09/2021 1803   ALBUMIN 4.3 12/10/2020 0802   AST 34 03/09/2021 1803   ALT 46 (H) 03/09/2021 1803   ALKPHOS 96 03/09/2021 1803   BILITOT 1.0 03/09/2021 1803   BILITOT 0.8 12/10/2020 0802   GFRNONAA >60 06/26/2021 1529   GFRNONAA 102 04/30/2020 0728   GFRAA 118 04/30/2020 0728     Diabetic Labs (most recent): Lab Results  Component Value Date   HGBA1C 8.6 (A) 10/23/2021   HGBA1C 8.9 (H) 06/26/2021    HGBA1C 8.6 (A) 03/20/2021    Lipid Panel     Component Value Date/Time   CHOL 115 12/10/2020 0802   TRIG 287 (H) 12/10/2020 0802   HDL 20 (L) 12/10/2020 0802   CHOLHDL 5.8 (H) 12/10/2020 0802   CHOLHDL 5.5 (H) 10/11/2019 0856   VLDL 47 (H) 03/13/2019 0547   LDLCALC 50 12/10/2020 0802   LDLCALC 63 10/11/2019 0856     Assessment & Plan:   1. Uncontrolled type 2 diabetes mellitus with chronic kidney disease, coronary artery disease, with long-term current use of insulin.  [place] presents with improved glycemic profile.  His CGM AGP shows 58% time range, 42% above range.  No hypoglycemia.  His point-of-care A1c is 8.6% overall improving.  His most recent couple of weeks average blood glucose is 172.    Recent labs reviewed. -his diabetes is complicated by obesity/sedentary life, chronic heavy smoking and AZLAAN ISIDORE remains at a high risk for more acute and chronic complications which include CAD, CVA, CKD, retinopathy, and neuropathy. These are all discussed in detail with the patient.  - I have counseled him on diet management and weight loss, by adopting a carbohydrate restricted/protein rich diet.  - he acknowledges that there is a room for improvement in his food and drink choices. - Suggestion is made for him to avoid simple carbohydrates  from his diet including Cakes, Sweet Desserts, Ice Cream, Soda (diet and regular), Sweet Tea, Candies, Chips, Cookies, Store Bought Juices, Alcohol , Artificial Sweeteners,  Coffee Creamer, and "Sugar-free" Products, Lemonade. This will help patient to have more stable blood glucose profile and potentially avoid unintended weight gain.  The following Lifestyle Medicine recommendations according to [facility]) were discussed and and offered to patient and he  agrees to start the journey:  A. Whole Foods, Plant-Based Nutrition comprising of fruits and vegetables, plant-based proteins, whole-grain  carbohydrates was discussed in detail with the patient.   A list for source of those nutrients were also provided to the patient.  Patient will use only water or unsweetened tea for hydration. B.  The need to stay away from risky substances including alcohol, smoking; obtaining 7 to 9 hours of restorative sleep, at least 150 minutes of moderate intensity exercise weekly, the importance of healthy social connections,  and stress management techniques were discussed. C.  A full color page of  Calorie density of various food groups per pound showing examples of each food groups was provided to the patient.   - he is advised to stick to a routine mealtimes to eat 3 meals  a day and avoid unnecessary snacks ( to snack only to correct hypoglycemia).   - I have approached him with the following individualized plan to manage diabetes and patient agrees:   -He will continue to require intensive treatment with basal/bolus insulin in order for him to achieve and maintain control of diabetes to target.    -In light of his presentation with with near target glycemic profile, he will be continued on the same insulin regimen and doses.   -He is advised to continue Basaglar 80 units nightly, continue NovoLog 24-30 units 3 times daily before meals, associated with strict monitoring of blood glucose 4 times a day-before meals and at bedtime . -He is advised to use his CGM at all times, especially monitoring before meals and at bedtime.  He is given a prescription for Libre 3.  -Patient is encouraged to call clinic for blood glucose levels less than 70 or above 200 mg /dl.  -He is advised to continue with Metformin 500 mg XR p.o. once daily. -He declined offer for weekly incretin injection therapy. -He is benefiting from low-dose glipizide.  He is advised to continue glipizide 5 mg XL p.o. daily at breakfast .  - Patient specific target  A1c;  LDL, HDL, Triglycerides,  were discussed in detail.  2) BP/HTN: -His  blood pressure is controlled to target.   He is advised to continue  with his current blood pressure medications including lisinopril 40 mg p.o. daily along with his carvedilol  3.125 mg p.o. twice daily.    3) Lipids: His recent lipid panel showed improving LDL to 50.  He is advised to continue Lipitor 80 mg p.o. nightly along with his omega-3 fatty acids 1000 mg p.o. twice daily.    4)  Weight/Diet: His BMI is 10.25--ENIDPOE complicating his diabetes care.  He is a candidate for major weight loss.  CDE Consult has been initiated  , exercise, and detailed carbohydrates information provided.  He is an ideal candidate for bariatric surgery.  He declined bariatric surgery as an option for weight control.    5) Chronic Care/Health Maintenance:  -he  is on ACEI/ARB and Statin medications and  is encouraged to continue to follow up with Ophthalmology, Dentist,  Podiatrist at least yearly or according to recommendations, and advised to  quit smoking. I have recommended yearly flu vaccine and pneumonia vaccination at least every 5 years; moderate intensity exercise for up to 150 minutes weekly; and  sleep for at least 7 hours a day.  - I advised patient to maintain close follow up with Lanelle Bal, PA-C for primary care needs.   I spent 44 minutes in the care of the patient today including review of labs from [facility], Lipids, Thyroid Function, Hematology (current and previous including abstractions from other facilities); face-to-face time discussing  his blood glucose readings/logs, discussing hypoglycemia and hyperglycemia episodes and symptoms, medications doses, his options of short and long term treatment based on the latest standards of care / guidelines;  discussion about incorporating lifestyle medicine;  and documenting the encounter.    Please refer to Patient Instructions for Blood Glucose Monitoring and Insulin/Medications Dosing Guide"  in media tab for additional information. Please  also refer  to " Patient Self Inventory" in the Media  tab for reviewed elements of pertinent patient history.  Redmond Pulling participated in the discussions, expressed understanding, and voiced agreement with the above plans.  All questions were answered to his satisfaction. he is encouraged to contact clinic should he have any questions or concerns prior to his return visit.    Follow up plan: - Return in about 4 months (around 02/20/2022) for F/U with Pre-visit Labs, Meter, Logs, A1c here.Glade Lloyd, MD Phone: [phone]  Fax: [phone]  -  This note was partially dictated with voice recognition software. Similar sounding words can be transcribed inadequately or may not  be corrected upon review.  10/23/2021, 5:35 PM

## 2021-10-23 NOTE — Patient Instructions (Signed)

## 2021-12-09 ENCOUNTER — Other Ambulatory Visit: Payer: Self-pay | Admitting: "Endocrinology

## 2021-12-09 DIAGNOSIS — E1165 Type 2 diabetes mellitus with hyperglycemia: Secondary | ICD-10-CM

## 2021-12-10 ENCOUNTER — Other Ambulatory Visit: Payer: Self-pay | Admitting: "Endocrinology

## 2021-12-10 DIAGNOSIS — E1165 Type 2 diabetes mellitus with hyperglycemia: Secondary | ICD-10-CM

## 2021-12-28 ENCOUNTER — Other Ambulatory Visit: Payer: Self-pay | Admitting: Nurse Practitioner

## 2022-01-26 ENCOUNTER — Other Ambulatory Visit: Payer: Self-pay | Admitting: "Endocrinology

## 2022-01-27 ENCOUNTER — Other Ambulatory Visit: Payer: Self-pay | Admitting: "Endocrinology

## 2022-02-19 LAB — COMPREHENSIVE METABOLIC PANEL
ALT: 37 IU/L (ref 0–44)
AST: 25 IU/L (ref 0–40)
Albumin/Globulin Ratio: 1.8 (ref 1.2–2.2)
Albumin: 4.3 g/dL (ref 4.0–5.0)
Alkaline Phosphatase: 104 IU/L (ref 44–121)
BUN/Creatinine Ratio: 12 (ref 9–20)
BUN: 13 mg/dL (ref 6–24)
Bilirubin Total: 0.6 mg/dL (ref 0.0–1.2)
CO2: 23 mmol/L (ref 20–29)
Calcium: 9.3 mg/dL (ref 8.7–10.2)
Chloride: 100 mmol/L (ref 96–106)
Creatinine, Ser: 1.09 mg/dL (ref 0.76–1.27)
Globulin, Total: 2.4 g/dL (ref 1.5–4.5)
Glucose: 132 mg/dL — ABNORMAL HIGH (ref 70–99)
Potassium: 4.7 mmol/L (ref 3.5–5.2)
Sodium: 137 mmol/L (ref 134–144)
Total Protein: 6.7 g/dL (ref 6.0–8.5)
eGFR: 85 mL/min/{1.73_m2} (ref >59)

## 2022-02-19 LAB — TSH: TSH: 1.61 u[IU]/mL (ref 0.450–4.500)

## 2022-02-19 LAB — LIPID PANEL
Chol/HDL Ratio: 5.9 ratio — ABNORMAL HIGH (ref 0.0–5.0)
Cholesterol, Total: 112 mg/dL (ref 100–199)
HDL: 19 mg/dL — ABNORMAL LOW (ref >39)
LDL Chol Calc (NIH): 61 mg/dL (ref 0–99)
Triglycerides: 192 mg/dL — ABNORMAL HIGH (ref 0–149)
VLDL Cholesterol Cal: 32 mg/dL (ref 5–40)

## 2022-02-19 LAB — T4, FREE: Free T4: 1.34 ng/dL (ref 0.82–1.77)

## 2022-02-20 ENCOUNTER — Encounter: Payer: Self-pay | Admitting: "Endocrinology

## 2022-02-20 ENCOUNTER — Ambulatory Visit (INDEPENDENT_AMBULATORY_CARE_PROVIDER_SITE_OTHER): Payer: 59 | Admitting: "Endocrinology

## 2022-02-20 VITALS — BP 142/80 | HR 84 | Ht 71.0 in | Wt 297.2 lb

## 2022-02-20 DIAGNOSIS — E1159 Type 2 diabetes mellitus with other circulatory complications: Secondary | ICD-10-CM | POA: Diagnosis not present

## 2022-02-20 DIAGNOSIS — I10 Essential (primary) hypertension: Secondary | ICD-10-CM | POA: Diagnosis not present

## 2022-02-20 DIAGNOSIS — E782 Mixed hyperlipidemia: Secondary | ICD-10-CM

## 2022-02-20 LAB — POCT GLYCOSYLATED HEMOGLOBIN (HGB A1C): HbA1c, POC (controlled diabetic range): 7.8 % — AB (ref 0.0–7.0)

## 2022-02-20 NOTE — Progress Notes (Signed)
02/20/2022  Endocrinology follow-up note  Subjective:    Patient ID: Lance [name], male   DOB: 12-Jan-1976.  Lance [name] is being seen in follow-up for management of currently uncontrolled type 2 diabetes, complicated by coronary artery disease recently which required stent placement, hyperlipidemia, hypertension.   Past Medical History:  Diagnosis Date   CAD (coronary artery disease), native coronary artery    10/18 PCI/DES to mRCA, and OM, with total occlusion of dLCx with collaterals.    Essential hypertension    GERD (gastroesophageal reflux disease)    History of kidney stones    Hyperlipemia    SVT (supraventricular tachycardia) ([facility]) 2015   Converted with adenosine   Type 2 diabetes mellitus ([place])    Past Surgical History:  Procedure Laterality Date   CARDIAC SURGERY     CORONARY ANGIOPLASTY WITH STENT PLACEMENT  07/15/2017   "2 stents"   CORONARY/GRAFT ACUTE MI REVASCULARIZATION N/A 03/12/2019   Procedure: Coronary/Graft Acute MI Revascularization;  Surgeon: Nelva Bush, MD;  Location: [facility];  Service: Cardiovascular;  Laterality: N/A;   LAPAROSCOPIC APPENDECTOMY N/A 11/12/2017   Procedure: APPENDECTOMY LAPAROSCOPIC, REPAIR OF INCARCERATED INCISIONAL HERNIA;  Surgeon: Michael Boston, MD;  Location: [facility];  Service: General;  Laterality: N/A;   LAPAROSCOPIC CHOLECYSTECTOMY     LEFT HEART CATH AND CORONARY ANGIOGRAPHY N/A 07/15/2017   Procedure: LEFT HEART CATH AND CORONARY ANGIOGRAPHY;  Surgeon: Sherren Mocha, MD;  Location: [facility];  Service: Cardiovascular;  Laterality: N/A;   LEFT HEART CATH AND CORONARY ANGIOGRAPHY N/A 03/12/2019   Procedure: LEFT HEART CATH AND CORONARY ANGIOGRAPHY;  Surgeon: Nelva Bush, MD;  Location: [facility];  Service: Cardiovascular;  Laterality: N/A;   SVT ABLATION N/A 04/22/2021   Procedure: SVT ABLATION;  Surgeon: Evans Lance, MD;  Location: [facility];  Service: Cardiovascular;  Laterality: N/A;    Social History   Socioeconomic History   Marital status: Married    Spouse name: Not on file   Number of children: Not on file   Years of education: Not on file   Highest education level: Not on file  Occupational History   Occupation: Superintendent    Employer: [employer]  Tobacco Use   Smoking status: Light Smoker    Packs/day: 0.50    Years: 26.00    Pack years: 13.00    Types: Cigarettes    Last attempt to quit: 03/07/2019    Years since quitting: 2.9   Smokeless tobacco: Never  Vaping Use   Vaping Use: Never used  Substance and Sexual Activity   Alcohol use: No   Drug use: No   Sexual activity: Yes    Birth control/protection: None  Other Topics Concern   Not on file  Social History Narrative   Not on file   Social Determinants of Health   Financial Resource Strain: Not on file  Food Insecurity: Not on file  Transportation Needs: Not on file  Physical Activity: Not on file  Stress: Not on file  Social Connections: Not on file   Outpatient Encounter Medications as of 02/20/2022  Medication Sig   ALPRAZolam (XANAX) 1 MG tablet Take 1 mg by mouth daily as needed for anxiety.   aspirin EC 81 MG tablet Take 1 tablet (81 mg total) by mouth daily with breakfast.   atorvastatin (LIPITOR) 80 MG tablet TAKE 1 TABLET BY MOUTH EVERYDAY AT BEDTIME   B-D ULTRAFINE III SHORT PEN 31G X 8 MM  MISC 1 EACH BY DOES NOT APPLY ROUTE 4 (FOUR) TIMES DAILY.   blood glucose meter kit and supplies KIT 1 each by Does not apply route 4 (four) times daily. Dispense based on patient and insurance preference. Use up to four times daily as directed. (FOR ICD-9 250.00, 250.01).   BRILINTA 60 MG TABS tablet TAKE 1 TABLET BY MOUTH 2 TIMES DAILY.   Continuous Blood Gluc Sensor (FREESTYLE LIBRE 3 SENSOR) MISC 1 Piece by Does not apply route every 14 (fourteen) days. Place 1 sensor on the skin every 14 days. Use to check glucose continuously   insulin aspart (NOVOLOG FLEXPEN) 100 UNIT/ML  FlexPen Inject 24-30 Units into the skin 3 (three) times daily with meals. Sliding scale   Insulin Glargine (BASAGLAR KWIKPEN) 100 UNIT/ML INJECT 80 UNITS INTO THE SKIN AT BEDTIME.   lisinopril (ZESTRIL) 40 MG tablet Take 1 tablet (40 mg total) by mouth daily.   nitroGLYCERIN (NITROSTAT) 0.4 MG SL tablet Place 1 tablet (0.4 mg total) under the tongue every 5 (five) minutes x 3 doses as needed for chest pain.   Omega-3 1000 MG CAPS Take 1,000 mg by mouth daily.   [DISCONTINUED] metFORMIN (GLUCOPHAGE-XR) 500 MG 24 hr tablet Take 2 tablets (1,000 mg total) by mouth daily with breakfast.   No facility-administered encounter medications on file as of 02/20/2022.   ALLERGIES: Allergies  Allergen Reactions   Bee Venom Anaphylaxis and Swelling   Penicillins Hives, Itching and Rash    Has patient had a PCN reaction causing immediate rash, facial/tongue/throat swelling, SOB or lightheadedness with hypotension: No Has patient had a PCN reaction causing severe rash involving mucus membranes or skin necrosis: No Has patient had a PCN reaction that required hospitalization: No Has patient had a PCN reaction occurring within the last 10 years: No If all of the above answers are "NO", then may proceed with Cephalosporin use.    VACCINATION STATUS:  There is no immunization history on file for this patient.  Diabetes He presents for his follow-up diabetic visit. He has type 2 diabetes mellitus. Onset time: He was diagnosed at approximate age of 32 years. His disease course has been improving (Was recently hospitalized due to appendicitis after 2 months of antibiotic treatment.  He is status post appendectomy.  ). There are no hypoglycemic associated symptoms. Pertinent negatives for hypoglycemia include no confusion, headaches, pallor or seizures. Pertinent negatives for diabetes include no blurred vision, no chest pain, no fatigue, no polydipsia, no polyphagia, no polyuria and no weakness. There are no  hypoglycemic complications. Symptoms are improving. There are no diabetic complications. Risk factors for coronary artery disease include diabetes mellitus, dyslipidemia, family history, hypertension, male sex, obesity, tobacco exposure and sedentary lifestyle. Current diabetic treatment includes intensive insulin program (He is on Levemir 68 units BID and Humalog 10 units TIDAC.). His weight is fluctuating minimally. He is following a generally unhealthy diet. When asked about meal planning, he reported none. He has not had a previous visit with a dietitian. He rarely participates in exercise. His home blood glucose trend is decreasing steadily. His breakfast blood glucose range is generally 140-180 mg/dl. His lunch blood glucose range is generally 140-180 mg/dl. His dinner blood glucose range is generally 140-180 mg/dl. His bedtime blood glucose range is generally 140-180 mg/dl. His overall blood glucose range is 140-180 mg/dl. Larkin Ina presents with improved glycemic profile.  His CGM AGP 70% time range, 29% slightly above range.  No hypoglycemia.  His point-of-care A1c is 7.8%, progressively  improving from 11.9%.  ) An ACE inhibitor/angiotensin II receptor blocker is being taken. He does not see a podiatrist.Eye exam is not current.  Hyperlipidemia This is a chronic problem. The current episode started more than 1 year ago. The problem is controlled. Exacerbating diseases include diabetes and obesity. Pertinent negatives include no chest pain, myalgias or shortness of breath. Current antihyperlipidemic treatment includes statins and bile acid squestrants. The current treatment provides significant improvement of lipids. Risk factors for coronary artery disease include diabetes mellitus, dyslipidemia, hypertension, male sex, obesity and a sedentary lifestyle.  Hypertension This is a chronic problem. The current episode started more than 1 year ago. Pertinent negatives include no blurred vision, chest pain,  headaches, neck pain, palpitations or shortness of breath. Risk factors for coronary artery disease include dyslipidemia, diabetes mellitus, male gender, smoking/tobacco exposure and sedentary lifestyle. Past treatments include ACE inhibitors. Hypertensive end-organ damage includes CAD/MI.  Review of systems  Constitutional: + Minimally fluctuating body weight,  current  Body mass index is 41.45 kg/m. , no fatigue, no subjective hyperthermia, no subjective hypothermia    Objective:    BP (!) 142/80   Pulse 84   Ht 5' 11"  (1.803 m)   Wt 297 lb 3.2 oz (134.8 kg)   BMI 41.45 kg/m   Wt Readings from Last 3 Encounters:  02/20/22 297 lb 3.2 oz (134.8 kg)  10/23/21 297 lb 12.8 oz (135.1 kg)  09/02/21 296 lb (134.3 kg)      Physical Exam- Limited  Constitutional:  Body mass index is 41.45 kg/m. , not in acute distress  CMP ( most recent) CMP     Component Value Date/Time   NA 137 02/18/2022 0756   K 4.7 02/18/2022 0756   CL 100 02/18/2022 0756   CO2 23 02/18/2022 0756   GLUCOSE 132 (H) 02/18/2022 0756   GLUCOSE 234 (H) 06/26/2021 1529   BUN 13 02/18/2022 0756   CREATININE 1.09 02/18/2022 0756   CREATININE 0.91 04/30/2020 0728   CALCIUM 9.3 02/18/2022 0756   PROT 6.7 02/18/2022 0756   ALBUMIN 4.3 02/18/2022 0756   AST 25 02/18/2022 0756   ALT 37 02/18/2022 0756   ALKPHOS 104 02/18/2022 0756   BILITOT 0.6 02/18/2022 0756   GFRNONAA >60 06/26/2021 1529   GFRNONAA 102 04/30/2020 0728   GFRAA 118 04/30/2020 0728     Diabetic Labs (most recent): Lab Results  Component Value Date   HGBA1C 7.8 (A) 02/20/2022   HGBA1C 8.6 (A) 10/23/2021   HGBA1C 8.9 (H) 06/26/2021    Lipid Panel     Component Value Date/Time   CHOL 112 02/18/2022 0756   TRIG 192 (H) 02/18/2022 0756   HDL 19 (L) 02/18/2022 0756   CHOLHDL 5.9 (H) 02/18/2022 0756   CHOLHDL 5.5 (H) 10/11/2019 0856   VLDL 47 (H) 03/13/2019 0547   LDLCALC 61 02/18/2022 0756   LDLCALC 63 10/11/2019 0856      Assessment & Plan:   1. Uncontrolled type 2 diabetes mellitus with chronic kidney disease, coronary artery disease, with long-term current use of insulin.  Cadel presents with improved glycemic profile.  His CGM AGP 70% time range, 29% slightly above range.  No hypoglycemia.  His point-of-care A1c is 7.8%, progressively improving from 11.9%.      Recent labs reviewed. -his diabetes is complicated by obesity/sedentary life, chronic heavy smoking and LAMARIO MANI remains at a high risk for more acute and chronic complications which include CAD, CVA, CKD, retinopathy, and neuropathy. These  are all discussed in detail with the patient.  - I have counseled him on diet management and weight loss, by adopting a carbohydrate restricted/protein rich diet.  - he acknowledges that there is a room for improvement in his food and drink choices. - Suggestion is made for him to avoid simple carbohydrates  from his diet including Cakes, Sweet Desserts, Ice Cream, Soda (diet and regular), Sweet Tea, Candies, Chips, Cookies, Store Bought Juices, Alcohol , Artificial Sweeteners,  Coffee Creamer, and "Sugar-free" Products, Lemonade. This will help patient to have more stable blood glucose profile and potentially avoid unintended weight gain.  The following Lifestyle Medicine recommendations according to [place] [facility]) were discussed and and offered to patient and he  agrees to start the journey:  A. Whole Foods, Plant-Based Nutrition comprising of fruits and vegetables, plant-based proteins, whole-grain carbohydrates was discussed in detail with the patient.   A list for source of those nutrients were also provided to the patient.  Patient will use only water or unsweetened tea for hydration. B.  The need to stay away from risky substances including alcohol, smoking; obtaining 7 to 9 hours of restorative sleep, at least 150 minutes of moderate intensity exercise weekly, the  importance of healthy social connections,  and stress management techniques were discussed. C.  A full color page of  Calorie density of various food groups per pound showing examples of each food groups was provided to the patient.   - he is advised to stick to a routine mealtimes to eat 3 meals  a day and avoid unnecessary snacks ( to snack only to correct hypoglycemia).   - I have approached him with the following individualized plan to manage diabetes and patient agrees:   -Because 2 of his friends have kidney failure, he decided to stop his metformin.   - He will continue to require intensive treatment with basal/bolus insulin in order for him to achieve and maintain control of diabetes to target.    -In light of his presentation with with near target glycemic profile, he will be continued on the same insulin regimen and doses.   -He is advised to continue Basaglar 80 units nightly, continue NovoLog  24-30 units 3 times daily before meals, associated with strict monitoring of blood glucose 4 times a day-before meals and at bedtime . -He is advised to use his CGM at all times, especially monitoring before meals and at bedtime.  He is given a prescription for Libre 3.  -Patient is encouraged to call clinic for blood glucose levels less than 70 or above 200 mg /dl.  -He is advised to discontinue metformin at this time.    -He declined offer for weekly incretin injection therapy. -He is benefiting from low-dose glipizide.  He is advised to continue glipizide 5 mg XL p.o. daily at breakfast .  - Patient specific target  A1c;  LDL, HDL, Triglycerides,  were discussed in detail.  2) BP/HTN: -His blood pressure is not controlled to target.   He is advised to continue  with his current blood pressure medications including lisinopril 40 mg p.o. daily along with his carvedilol 3.125 mg p.o. twice daily.    3) Lipids: His recent lipid panel showed improving LDL to 50.  He is advised to continue  Lipitor 80 mg p.o. nightly.  He is also on omega-3 fatty acids 1000 mg p.o. twice daily.  Whole food plant-based diet will help with dyslipidemia.  4)  Weight/Diet: His BMI is 59.16---[id number] complicating his diabetes care.  He is a candidate for major weight loss.  CDE Consult has been initiated  , exercise, and detailed carbohydrates information provided.  He is an ideal candidate for bariatric surgery.  He declined bariatric surgery as an option for weight control.    5) Chronic Care/Health Maintenance:  -he  is on ACEI/ARB and Statin medications and  is encouraged to continue to follow up with Ophthalmology, Dentist,  Podiatrist at least yearly or according to recommendations, and advised to  quit smoking. I have recommended yearly flu vaccine and pneumonia vaccination at least every 5 years; moderate intensity exercise for up to 150 minutes weekly; and  sleep for at least 7 hours a day.  - I advised patient to maintain close follow up with Lanelle Bal, PA-C for primary care needs.   I spent 41 minutes in the care of the patient today including review of labs from [facility], Lipids, Thyroid Function, Hematology (current and previous including abstractions from other facilities); face-to-face time discussing  his blood glucose readings/logs, discussing hypoglycemia and hyperglycemia episodes and symptoms, medications doses, his options of short and long term treatment based on the latest standards of care / guidelines;  discussion about incorporating lifestyle medicine;  and documenting the encounter.    Please refer to Patient Instructions for Blood Glucose Monitoring and Insulin/Medications Dosing Guide"  in media tab for additional information. Please  also refer to " Patient Self Inventory" in the Media  tab for reviewed elements of pertinent patient history.  Redmond Pulling participated in the discussions, expressed understanding, and voiced agreement with the above plans.  All questions were  answered to his satisfaction. he is encouraged to contact clinic should he have any questions or concerns prior to his return visit.     Follow up plan: - Return in about 3 months (around 05/23/2022) for Bring Meter/CGM Device/Logs- A1c in Office.  Glade Lloyd, MD Phone: [phone]  Fax: [phone]  -  This note was partially dictated with voice recognition software. Similar sounding words can be transcribed inadequately or may not  be corrected upon review.  02/20/2022, 5:09 PM

## 2022-02-20 NOTE — Patient Instructions (Signed)

## 2022-03-06 ENCOUNTER — Other Ambulatory Visit: Payer: Self-pay | Admitting: Cardiology

## 2022-03-17 ENCOUNTER — Other Ambulatory Visit: Payer: Self-pay | Admitting: Cardiology

## 2022-04-03 ENCOUNTER — Other Ambulatory Visit: Payer: Self-pay | Admitting: "Endocrinology

## 2022-05-04 ENCOUNTER — Other Ambulatory Visit: Payer: Self-pay | Admitting: "Endocrinology

## 2022-05-04 DIAGNOSIS — E1165 Type 2 diabetes mellitus with hyperglycemia: Secondary | ICD-10-CM

## 2022-05-05 ENCOUNTER — Other Ambulatory Visit: Payer: Self-pay

## 2022-05-05 DIAGNOSIS — E1159 Type 2 diabetes mellitus with other circulatory complications: Secondary | ICD-10-CM

## 2022-05-05 MED ORDER — BASAGLAR KWIKPEN 100 UNIT/ML ~~LOC~~ SOPN: 80.0000 [IU] | PEN_INJECTOR | Freq: Every day | SUBCUTANEOUS | 0 refills | Status: DC

## 2022-05-29 ENCOUNTER — Ambulatory Visit (INDEPENDENT_AMBULATORY_CARE_PROVIDER_SITE_OTHER): Payer: 59 | Admitting: "Endocrinology

## 2022-05-29 ENCOUNTER — Encounter: Payer: Self-pay | Admitting: "Endocrinology

## 2022-05-29 VITALS — BP 140/78 | HR 84 | Ht 71.0 in | Wt 295.4 lb

## 2022-05-29 DIAGNOSIS — E1159 Type 2 diabetes mellitus with other circulatory complications: Secondary | ICD-10-CM | POA: Diagnosis not present

## 2022-05-29 DIAGNOSIS — E782 Mixed hyperlipidemia: Secondary | ICD-10-CM | POA: Diagnosis not present

## 2022-05-29 DIAGNOSIS — I10 Essential (primary) hypertension: Secondary | ICD-10-CM

## 2022-05-29 LAB — POCT GLYCOSYLATED HEMOGLOBIN (HGB A1C): HbA1c, POC (controlled diabetic range): 7.7 % — AB (ref 0.0–7.0)

## 2022-05-29 MED ORDER — BASAGLAR KWIKPEN 100 UNIT/ML ~~LOC~~ SOPN: 80.0000 [IU] | PEN_INJECTOR | Freq: Every day | SUBCUTANEOUS | 1 refills | Status: DC

## 2022-05-29 MED ORDER — NOVOLOG FLEXPEN 100 UNIT/ML ~~LOC~~ SOPN: 20.0000 [IU] | PEN_INJECTOR | Freq: Three times a day (TID) | SUBCUTANEOUS | 1 refills | Status: DC

## 2022-05-29 NOTE — Patient Instructions (Signed)

## 2022-05-29 NOTE — Progress Notes (Signed)
05/29/2022  Endocrinology follow-up note  Subjective:    Patient ID: Lance [name], male   DOB: 1976-05-28.  Lance [name] is being seen in follow-up for management of currently uncontrolled type 2 diabetes, complicated by coronary artery disease recently which required stent placement, hyperlipidemia, hypertension.   Past Medical History:  Diagnosis Date   CAD (coronary artery disease), native coronary artery    10/18 PCI/DES to mRCA, and OM, with total occlusion of dLCx with collaterals.    Essential hypertension    GERD (gastroesophageal reflux disease)    History of kidney stones    Hyperlipemia    SVT (supraventricular tachycardia) ([place]) 2015   Converted with adenosine   Type 2 diabetes mellitus ([place])    Past Surgical History:  Procedure Laterality Date   CARDIAC SURGERY     CORONARY ANGIOPLASTY WITH STENT PLACEMENT  07/15/2017   "2 stents"   CORONARY/GRAFT ACUTE MI REVASCULARIZATION N/A 03/12/2019   Procedure: Coronary/Graft Acute MI Revascularization;  Surgeon: Nelva Bush, MD;  Location: [facility];  Service: Cardiovascular;  Laterality: N/A;   LAPAROSCOPIC APPENDECTOMY N/A 11/12/2017   Procedure: APPENDECTOMY LAPAROSCOPIC, REPAIR OF INCARCERATED INCISIONAL HERNIA;  Surgeon: Michael Boston, MD;  Location: [facility];  Service: General;  Laterality: N/A;   LAPAROSCOPIC CHOLECYSTECTOMY     LEFT HEART CATH AND CORONARY ANGIOGRAPHY N/A 07/15/2017   Procedure: LEFT HEART CATH AND CORONARY ANGIOGRAPHY;  Surgeon: Sherren Mocha, MD;  Location: [facility];  Service: Cardiovascular;  Laterality: N/A;   LEFT HEART CATH AND CORONARY ANGIOGRAPHY N/A 03/12/2019   Procedure: LEFT HEART CATH AND CORONARY ANGIOGRAPHY;  Surgeon: Nelva Bush, MD;  Location: [place] [facility];  Service: Cardiovascular;  Laterality: N/A;   SVT ABLATION N/A 04/22/2021   Procedure: SVT ABLATION;  Surgeon: Evans Lance, MD;  Location: [place] [facility];  Service: Cardiovascular;  Laterality: N/A;    Social History   Socioeconomic History   Marital status: Married    Spouse name: Not on file   Number of children: Not on file   Years of education: Not on file   Highest education level: Not on file  Occupational History   Occupation: Superintendent    Employer: [employer]  Tobacco Use   Smoking status: Light Smoker    Packs/day: 0.50    Years: 26.00    Total pack years: 13.00    Types: Cigarettes    Last attempt to quit: 03/07/2019    Years since quitting: 3.2   Smokeless tobacco: Never  Vaping Use   Vaping Use: Never used  Substance and Sexual Activity   Alcohol use: No   Drug use: No   Sexual activity: Yes    Birth control/protection: None  Other Topics Concern   Not on file  Social History Narrative   Not on file   Social Determinants of Health   Financial Resource Strain: Not on file  Food Insecurity: Not on file  Transportation Needs: Not on file  Physical Activity: Not on file  Stress: Not on file  Social Connections: Not on file   Outpatient Encounter Medications as of 05/29/2022  Medication Sig   ALPRAZolam (XANAX) 1 MG tablet Take 1 mg by mouth daily as needed for anxiety.   aspirin EC 81 MG tablet Take 1 tablet (81 mg total) by mouth daily with breakfast.   atorvastatin (LIPITOR) 80 MG tablet TAKE 1 TABLET BY MOUTH EVERYDAY AT BEDTIME   B-D ULTRAFINE III SHORT PEN 31G X 8  MM MISC 1 EACH BY DOES NOT APPLY ROUTE 4 (FOUR) TIMES DAILY.   blood glucose meter kit and supplies KIT 1 each by Does not apply route 4 (four) times daily. Dispense based on patient and insurance preference. Use up to four times daily as directed. (FOR ICD-9 250.00, 250.01).   BRILINTA 60 MG TABS tablet TAKE 1 TABLET BY MOUTH TWICE A DAY   Continuous Blood Gluc Sensor (FREESTYLE LIBRE 3 SENSOR) MISC 1 PIECE BY DOES NOT APPLY ROUTE EVERY 14 (FOURTEEN) DAYS. PLACE 1 SENSOR ON THE SKIN EVERY 14 DAYS. USE TO CHECK GLUCOSE CONTINUOUSLY   insulin aspart (NOVOLOG FLEXPEN) 100 UNIT/ML  FlexPen Inject 20-26 Units into the skin 3 (three) times daily with meals. Sliding scale   Insulin Glargine (BASAGLAR KWIKPEN) 100 UNIT/ML Inject 80 Units into the skin at bedtime.   lisinopril (ZESTRIL) 40 MG tablet Take 1 tablet (40 mg total) by mouth daily.   nitroGLYCERIN (NITROSTAT) 0.4 MG SL tablet Place 1 tablet (0.4 mg total) under the tongue every 5 (five) minutes x 3 doses as needed for chest pain.   Omega-3 1000 MG CAPS Take 1,000 mg by mouth daily.   [DISCONTINUED] Insulin Glargine (BASAGLAR KWIKPEN) 100 UNIT/ML Inject 80 Units into the skin at bedtime.   [DISCONTINUED] NOVOLOG FLEXPEN 100 UNIT/ML FlexPen INJECT 24-30 UNITS INTO THE SKIN 3 (THREE) TIMES DAILY WITH MEALS. SLIDING SCALE   No facility-administered encounter medications on file as of 05/29/2022.   ALLERGIES: Allergies  Allergen Reactions   Bee Venom Anaphylaxis and Swelling   Penicillins Hives, Itching and Rash    Has patient had a PCN reaction causing immediate rash, facial/tongue/throat swelling, SOB or lightheadedness with hypotension: No Has patient had a PCN reaction causing severe rash involving mucus membranes or skin necrosis: No Has patient had a PCN reaction that required hospitalization: No Has patient had a PCN reaction occurring within the last 10 years: No If all of the above answers are "NO", then may proceed with Cephalosporin use.    VACCINATION STATUS:  There is no immunization history on file for this patient.  Diabetes He presents for his follow-up diabetic visit. He has type 2 diabetes mellitus. Onset time: He was diagnosed at approximate age of 37 years. His disease course has been improving (Was recently hospitalized due to appendicitis after 2 months of antibiotic treatment.  He is status post appendectomy.  ). There are no hypoglycemic associated symptoms. Pertinent negatives for hypoglycemia include no confusion, headaches, pallor or seizures. Pertinent negatives for diabetes include no  blurred vision, no chest pain, no fatigue, no polydipsia, no polyphagia, no polyuria and no weakness. There are no hypoglycemic complications. Symptoms are improving. There are no diabetic complications. Risk factors for coronary artery disease include diabetes mellitus, dyslipidemia, family history, hypertension, male sex, obesity, tobacco exposure and sedentary lifestyle. Current diabetic treatment includes intensive insulin program (He is on Levemir 68 units BID and Humalog 10 units TIDAC.). His weight is fluctuating minimally. He is following a generally unhealthy diet. When asked about meal planning, he reported none. He has not had a previous visit with a dietitian. He rarely participates in exercise. His home blood glucose trend is decreasing steadily. His breakfast blood glucose range is generally 130-140 mg/dl. His lunch blood glucose range is generally 140-180 mg/dl. His dinner blood glucose range is generally 140-180 mg/dl. His bedtime blood glucose range is generally 140-180 mg/dl. His overall blood glucose range is 140-180 mg/dl. [place] presents with continued improvement in his glycemic  profile.  He is freestyle libre AGP was analyzed and discussed with him.  For the last 30 days his average blood glucose is 151.  He does not 57% time range, 39% Libre 1 hyperglycemia 4% level 2 hyperglycemia.  No hypoglycemia.  His point-of-care A1c is 7.7%.  Is a general improvement from A1c of 11.9%. He is partially engaged in lifestyle medicine.) An ACE inhibitor/angiotensin II receptor blocker is being taken. He does not see a podiatrist.Eye exam is not current.  Hyperlipidemia This is a chronic problem. The current episode started more than 1 year ago. The problem is controlled. Exacerbating diseases include diabetes and obesity. Pertinent negatives include no chest pain, myalgias or shortness of breath. Current antihyperlipidemic treatment includes statins and bile acid squestrants. The current treatment  provides significant improvement of lipids. Risk factors for coronary artery disease include diabetes mellitus, dyslipidemia, hypertension, male sex, obesity and a sedentary lifestyle.  Hypertension This is a chronic problem. The current episode started more than 1 year ago. Pertinent negatives include no blurred vision, chest pain, headaches, neck pain, palpitations or shortness of breath. Risk factors for coronary artery disease include dyslipidemia, diabetes mellitus, male gender, smoking/tobacco exposure and sedentary lifestyle. Past treatments include ACE inhibitors. Hypertensive end-organ damage includes CAD/MI.   Review of systems  Constitutional: + Minimally fluctuating body weight,  current  Body mass index is 41.2 kg/m. , no fatigue, no subjective hyperthermia, no subjective hypothermia    Objective:    BP (!) 140/78   Pulse 84   Ht 5' 11" (1.803 m)   Wt 295 lb 6.4 oz (134 kg)   BMI 41.20 kg/m   Wt Readings from Last 3 Encounters:  05/29/22 295 lb 6.4 oz (134 kg)  02/20/22 297 lb 3.2 oz (134.8 kg)  10/23/21 297 lb 12.8 oz (135.1 kg)      Physical Exam- Limited  Constitutional:  Body mass index is 41.2 kg/m. , not in acute distress  CMP ( most recent) CMP     Component Value Date/Time   NA 137 02/18/2022 0756   K 4.7 02/18/2022 0756   CL 100 02/18/2022 0756   CO2 23 02/18/2022 0756   GLUCOSE 132 (H) 02/18/2022 0756   GLUCOSE 234 (H) 06/26/2021 1529   BUN 13 02/18/2022 0756   CREATININE 1.09 02/18/2022 0756   CREATININE 0.91 04/30/2020 0728   CALCIUM 9.3 02/18/2022 0756   PROT 6.7 02/18/2022 0756   ALBUMIN 4.3 02/18/2022 0756   AST 25 02/18/2022 0756   ALT 37 02/18/2022 0756   ALKPHOS 104 02/18/2022 0756   BILITOT 0.6 02/18/2022 0756   GFRNONAA >60 06/26/2021 1529   GFRNONAA 102 04/30/2020 0728   GFRAA 118 04/30/2020 0728     Diabetic Labs (most recent): Lab Results  Component Value Date   HGBA1C 7.7 (A) 05/29/2022   HGBA1C 7.8 (A) 02/20/2022    HGBA1C 8.6 (A) 10/23/2021   MICROALBUR 1.0 04/30/2020   MICROALBUR 0.4 10/11/2019   MICROALBUR 0.8 04/19/2018    Lipid Panel     Component Value Date/Time   CHOL 112 02/18/2022 0756   TRIG 192 (H) 02/18/2022 0756   HDL 19 (L) 02/18/2022 0756   CHOLHDL 5.9 (H) 02/18/2022 0756   CHOLHDL 5.5 (H) 10/11/2019 0856   VLDL 47 (H) 03/13/2019 0547   LDLCALC 61 02/18/2022 0756   LDLCALC 63 10/11/2019 0856     Assessment & Plan:   1. Uncontrolled type 2 diabetes mellitus with chronic kidney disease, coronary artery disease, with  long-term current use of insulin. [place] presents with continued improvement in his glycemic profile.  He is freestyle libre AGP was analyzed and discussed with him.  For the last 30 days his average blood glucose is 151.  He does not 57% time range, 39% Libre 1 hyperglycemia 4% level 2 hyperglycemia.  No hypoglycemia.  His point-of-care A1c is 7.7%.  Is a general improvement from A1c of 11.9%. He is partially engaged in lifestyle medicine.   Recent labs reviewed. -his diabetes is complicated by obesity/sedentary life, chronic heavy smoking and GARRET TEALE remains at a high risk for more acute and chronic complications which include CAD, CVA, CKD, retinopathy, and neuropathy. These are all discussed in detail with the patient.  - I have counseled him on diet management and weight loss, by adopting a carbohydrate restricted/protein rich diet.  - he acknowledges that there is a room for improvement in his food and drink choices. - Suggestion is made for him to avoid simple carbohydrates  from his diet including Cakes, Sweet Desserts, Ice Cream, Soda (diet and regular), Sweet Tea, Candies, Chips, Cookies, Store Bought Juices, Alcohol , Artificial Sweeteners,  Coffee Creamer, and "Sugar-free" Products, Lemonade. This will help patient to have more stable blood glucose profile and potentially avoid unintended weight gain.  The following Lifestyle Medicine recommendations  according to [place]  [facility]) were discussed and and offered to patient and he  agrees to start the journey:  A. Whole Foods, Plant-Based Nutrition comprising of fruits and vegetables, plant-based proteins, whole-grain carbohydrates was discussed in detail with the patient.   A list for source of those nutrients were also provided to the patient.  Patient will use only water or unsweetened tea for hydration. B.  The need to stay away from risky substances including alcohol, smoking; obtaining 7 to 9 hours of restorative sleep, at least 150 minutes of moderate intensity exercise weekly, the importance of healthy social connections,  and stress management techniques were discussed. C.  A full color page of  Calorie density of various food groups per pound showing examples of each food groups was provided to the patient.   - he is advised to stick to a routine mealtimes to eat 3 meals  a day and avoid unnecessary snacks ( to snack only to correct hypoglycemia).   - I have approached him with the following individualized plan to manage diabetes and patient agrees:   -Because 2 of his friends have kidney failure, he decided to stop his metformin.   - He will continue to require intensive treatment with basal/bolus insulin in order for him to achieve and maintain control of diabetes to target.    -In light of his presentation with significantly improved glycemic profile, he would not need further escalation in his insulin treatment.  He is advised to continue Basaglar 80 units nightly, lower his NovoLog to 20  units 3 times daily before meals, associated with strict monitoring of blood glucose 4 times a day-before meals and at bedtime . -He is advised to use his CGM at all times, especially monitoring before meals and at bedtime.  He is given a prescription for Libre 3.  -Patient is encouraged to call clinic for blood glucose levels less than 70 or above 200 mg /dl.  -He is  advised to discontinue metformin at this time.    -He declined offer for weekly incretin injection therapy. -He decided to come off of glipizide.  - Patient specific  target  A1c;  LDL, HDL, Triglycerides,  were discussed in detail.  2) BP/HTN: -His blood pressure is not controlled to target.   He is advised to continue  with his current blood pressure medications including lisinopril 40 mg p.o. daily along with his carvedilol 3.125 mg p.o. twice daily.    3) Lipids: His recent lipid panel showed improving LDL to 50.  He is advised to continue Lipitor 80 mg p.o. nightly.  He is also on omega-3 fatty acids 1000 mg p.o. twice daily.  Whole food plant-based diet will help with dyslipidemia.      4)  Weight/Diet: His BMI is 70.62---BJSEGBT complicating his diabetes care.  He is a candidate for major weight loss.  CDE Consult has been initiated  , exercise, and detailed carbohydrates information provided.  He is an ideal candidate for bariatric surgery.    5) Chronic Care/Health Maintenance:  -he  is on ACEI/ARB and Statin medications and  is encouraged to continue to follow up with Ophthalmology, Dentist,  Podiatrist at least yearly or according to recommendations, and advised to  quit smoking. I have recommended yearly flu vaccine and pneumonia vaccination at least every 5 years; moderate intensity exercise for up to 150 minutes weekly; and  sleep for at least 7 hours a day.  - I advised patient to maintain close follow up with Lanelle Bal, PA-C for primary care needs.   I spent 41 minutes in the care of the patient today including review of labs from [place], Lipids, Thyroid Function, Hematology (current and previous including abstractions from other facilities); face-to-face time discussing  his blood glucose readings/logs, discussing hypoglycemia and hyperglycemia episodes and symptoms, medications doses, his options of short and long term treatment based on the latest standards of care /  guidelines;  discussion about incorporating lifestyle medicine;  and documenting the encounter. Risk reduction counseling performed per USPSTF guidelines to reduce obesity and cardiovascular risk factors.     Please refer to Patient Instructions for Blood Glucose Monitoring and Insulin/Medications Dosing Guide"  in media tab for additional information. Please  also refer to " Patient Self Inventory" in the Media  tab for reviewed elements of pertinent patient history.  Redmond Pulling participated in the discussions, expressed understanding, and voiced agreement with the above plans.  All questions were answered to his satisfaction. he is encouraged to contact clinic should he have any questions or concerns prior to his return visit.   Follow up plan: - Return in about 4 months (around 09/28/2022) for F/U with Pre-visit Labs, Meter/CGM/Logs, A1c here.  Glade Lloyd, MD Phone: [phone]  Fax: [phone]  -  This note was partially dictated with voice recognition software. Similar sounding words can be transcribed inadequately or may not  be corrected upon review.  05/29/2022, 6:17 PM

## 2022-06-11 ENCOUNTER — Other Ambulatory Visit: Payer: Self-pay | Admitting: Internal Medicine

## 2022-06-24 ENCOUNTER — Ambulatory Visit: Payer: 59 | Attending: Internal Medicine | Admitting: Internal Medicine

## 2022-06-24 ENCOUNTER — Encounter: Payer: Self-pay | Admitting: Internal Medicine

## 2022-06-24 VITALS — BP 128/60 | HR 87 | Ht 71.0 in | Wt 296.4 lb

## 2022-06-24 DIAGNOSIS — I25119 Atherosclerotic heart disease of native coronary artery with unspecified angina pectoris: Secondary | ICD-10-CM

## 2022-06-24 NOTE — Patient Instructions (Signed)
Medication Instructions:  Your physician recommends that you continue on your current medications as directed. Please refer to the Current Medication list given to you today.  *If you need a refill on your cardiac medications before your next appointment, please call your pharmacy*   Lab Work: NONE   If you have labs (blood work) drawn today and your tests are completely normal, you will receive your results only by: MyChart Message (if you have MyChart) OR A paper copy in the mail If you have any lab test that is abnormal or we need to change your treatment, we will call you to review the results.   Testing/Procedures: NONE    Follow-Up: At [facility], you and your health needs are our priority.  As part of our continuing mission to provide you with exceptional heart care, we have created designated Provider Care Teams.  These Care Teams include your primary Cardiologist (physician) and Advanced Practice Providers (APPs -  Physician Assistants and Nurse Practitioners) who all work together to provide you with the care you need, when you need it.  We recommend signing up for the patient portal called "MyChart".  Sign up information is provided on this After Visit Summary.  MyChart is used to connect with patients for Virtual Visits (Telemedicine).  Patients are able to view lab/test results, encounter notes, upcoming appointments, etc.  Non-urgent messages can be sent to your provider as well.   To learn more about what you can do with MyChart, go to [email or web address].    Your next appointment:    As Needed   The format for your next appointment:   In Person  Provider:   Gregg Taylor, MD    Other Instructions Thank you for choosing [facility]!    Important Information About Sugar       

## 2022-06-24 NOTE — Progress Notes (Signed)
HPI Lance [name] returns for followup of SVT. He is a pleasant 46 yo man with CAD, s/p stenting, who has had symptomatic SVT for about 4 years. His spells start and stop suddenly and break with IV adenosine. 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 He underwent successful ablation rendering his SVT non-inducible. He has done well in the interim. He has had very rare episodes where he feels like his heart races for a second or two then stops. He has been prescribed Ozempic. He has questions about pancreatitis.  Allergies  Allergen Reactions   Bee Venom Anaphylaxis and Swelling   Penicillins Hives, Itching and Rash    Has patient had a PCN reaction causing immediate rash, facial/tongue/throat swelling, SOB or lightheadedness with hypotension: No Has patient had a PCN reaction causing severe rash involving mucus membranes or skin necrosis: No Has patient had a PCN reaction that required hospitalization: No Has patient had a PCN reaction occurring within the last 10 years: No If all of the above answers are "NO", then may proceed with Cephalosporin use.      Current Outpatient Medications  Medication Sig Dispense Refill   ALPRAZolam (XANAX) 1 MG tablet Take 1 mg by mouth daily as needed for anxiety.     aspirin EC 81 MG tablet Take 1 tablet (81 mg total) by mouth daily with breakfast. 120 tablet 5   atorvastatin (LIPITOR) 80 MG tablet TAKE 1 TABLET BY MOUTH EVERYDAY AT BEDTIME 90 tablet 1   B-D ULTRAFINE III SHORT PEN 31G X 8 MM MISC 1 EACH BY DOES NOT APPLY ROUTE 4 (FOUR) TIMES DAILY. 100 each 2   blood glucose meter kit and supplies KIT 1 each by Does not apply route 4 (four) times daily. Dispense based on patient and insurance preference. Use up to four times daily as directed. (FOR ICD-9 250.00, 250.01). 1 each 0   BRILINTA 60 MG TABS tablet TAKE 1 TABLET BY MOUTH TWICE A DAY 180 tablet 2   Continuous Blood Gluc Sensor (FREESTYLE LIBRE 3 SENSOR)  MISC 1 PIECE BY DOES NOT APPLY ROUTE EVERY 14 (FOURTEEN) DAYS. PLACE 1 SENSOR ON THE SKIN EVERY 14 DAYS. USE TO CHECK GLUCOSE CONTINUOUSLY 2 each 2   insulin aspart (NOVOLOG FLEXPEN) 100 UNIT/ML FlexPen Inject 20-26 Units into the skin 3 (three) times daily with meals. Sliding scale 90 mL 1   Insulin Glargine (BASAGLAR KWIKPEN) 100 UNIT/ML Inject 80 Units into the skin at bedtime. 90 mL 1   lisinopril (ZESTRIL) 40 MG tablet TAKE 1 TABLET BY MOUTH EVERY DAY 90 tablet 1   nitroGLYCERIN (NITROSTAT) 0.4 MG SL tablet Place 1 tablet (0.4 mg total) under the tongue every 5 (five) minutes x 3 doses as needed for chest pain. 25 tablet 3   Omega-3 1000 MG CAPS Take 1,000 mg by mouth daily.     No current facility-administered medications for this visit.     Past Medical History:  Diagnosis Date   CAD (coronary artery disease), native coronary artery    10/18 PCI/DES to mRCA, and OM, with total occlusion of dLCx with collaterals.    Essential hypertension    GERD (gastroesophageal reflux disease)    History of kidney stones    Hyperlipemia    SVT (supraventricular tachycardia) ([place]) 2015   Converted with adenosine   Type 2 diabetes mellitus ([place])     ROS:   All systems reviewed and negative except  as noted in the HPI.   Past Surgical History:  Procedure Laterality Date   CARDIAC SURGERY     CORONARY ANGIOPLASTY WITH STENT PLACEMENT  07/15/2017   "2 stents"   CORONARY/GRAFT ACUTE MI REVASCULARIZATION N/A 03/12/2019   Procedure: Coronary/Graft Acute MI Revascularization;  Surgeon: Nelva Bush, MD;  Location: [facility];  Service: Cardiovascular;  Laterality: N/A;   LAPAROSCOPIC APPENDECTOMY N/A 11/12/2017   Procedure: APPENDECTOMY LAPAROSCOPIC, REPAIR OF INCARCERATED INCISIONAL HERNIA;  Surgeon: Michael Boston, MD;  Location: [facility];  Service: General;  Laterality: N/A;   LAPAROSCOPIC CHOLECYSTECTOMY     LEFT HEART CATH AND CORONARY ANGIOGRAPHY N/A 07/15/2017   Procedure: LEFT  HEART CATH AND CORONARY ANGIOGRAPHY;  Surgeon: Sherren Mocha, MD;  Location: [facility];  Service: Cardiovascular;  Laterality: N/A;   LEFT HEART CATH AND CORONARY ANGIOGRAPHY N/A 03/12/2019   Procedure: LEFT HEART CATH AND CORONARY ANGIOGRAPHY;  Surgeon: Nelva Bush, MD;  Location: [facility];  Service: Cardiovascular;  Laterality: N/A;   SVT ABLATION N/A 04/22/2021   Procedure: SVT ABLATION;  Surgeon: Evans Lance, MD;  Location: [facility];  Service: Cardiovascular;  Laterality: N/A;     Family History  Problem Relation Age of Onset   Heart disease Mother 40       CABG   Diabetes Mother    Gout Mother    Heart attack Father 62     Social History   Socioeconomic History   Marital status: Married    Spouse name: Not on file   Number of children: Not on file   Years of education: Not on file   Highest education level: Not on file  Occupational History   Occupation: Superintendent    Employer: [employer]  Tobacco Use   Smoking status: Light Smoker    Packs/day: 0.50    Years: 26.00    Total pack years: 13.00    Types: Cigarettes    Last attempt to quit: 03/07/2019    Years since quitting: 3.3   Smokeless tobacco: Never  Vaping Use   Vaping Use: Never used  Substance and Sexual Activity   Alcohol use: No   Drug use: No   Sexual activity: Yes    Birth control/protection: None  Other Topics Concern   Not on file  Social History Narrative   Not on file   Social Determinants of Health   Financial Resource Strain: Not on file  Food Insecurity: Not on file  Transportation Needs: Not on file  Physical Activity: Not on file  Stress: Not on file  Social Connections: Not on file  Intimate Partner Violence: Not on file     BP 128/60   Pulse 87   Ht _0  (1.803 m)   Wt 296 lb 6.4 oz (134.4 kg)   SpO2 97%   BMI 41.34 kg/m   Physical Exam:  Obese, but well appearing NAD HEENT: Unremarkable Neck:  No JVD, no  thyromegally Lymphatics:  No adenopathy Back:  No CVA tenderness Lungs:  Clear with no wheezes HEART:  Regular rate rhythm, no murmurs, no rubs, no clicks Abd:  soft, positive bowel sounds, no organomegally, no rebound, no guarding Ext:  2 plus pulses, no edema, no cyanosis, no clubbing Skin:  No rashes no nodules Neuro:  CN II through XII intact, motor grossly intact  EKG  DEVICE  Normal device function.  See PaceArt for details.   Assess/Plan:   SVT - he is  s/p EPS/RFA of AVNRT and is doing well with no recurrent SVT Obesity - he is encouraged to work on weight loss. His weight is down 2 lbs since his ablation. HTN - His bp is well controlled. He is encouraged to consider ozempic which will help his BP.  CAD - He denies anginal symptoms. We will follow. He is encouraged to increase his physical activity.   Lance Overlie Dariel Pellecchia,MD

## 2022-06-26 ENCOUNTER — Other Ambulatory Visit: Payer: Self-pay | Admitting: "Endocrinology

## 2022-07-01 ENCOUNTER — Other Ambulatory Visit (HOSPITAL_COMMUNITY): Payer: Self-pay

## 2022-07-01 ENCOUNTER — Telehealth: Payer: Self-pay

## 2022-07-01 DIAGNOSIS — E1159 Type 2 diabetes mellitus with other circulatory complications: Secondary | ICD-10-CM

## 2022-07-01 NOTE — Telephone Encounter (Signed)
Patient Advocate Encounter  Received PA request for [employer] 3 Sensor.   Per Test Claim: Dexcom is preferred  Messaged Doctor to: either continue with PA or change prescription to [facility]

## 2022-07-02 ENCOUNTER — Other Ambulatory Visit (HOSPITAL_COMMUNITY): Payer: Self-pay

## 2022-07-02 ENCOUNTER — Telehealth: Payer: Self-pay

## 2022-07-02 NOTE — Telephone Encounter (Signed)
Noted  

## 2022-07-02 NOTE — Telephone Encounter (Signed)
  Received notification from [place] that prior authorization is required for Dexcom G6 Sensor. PA submitted and APPROVED on 07/02/22.  Key [id number] Effective: 07/02/22 - 07/02/23

## 2022-07-03 ENCOUNTER — Other Ambulatory Visit (HOSPITAL_COMMUNITY): Payer: Self-pay

## 2022-07-03 MED ORDER — DEXCOM G7 SENSOR MISC: 2 refills | Status: DC

## 2022-07-03 NOTE — Telephone Encounter (Signed)
Sent Rx for Dexcom G7 sensors to [facility]. Pt made aware.

## 2022-07-03 NOTE — Addendum Note (Signed)
Addended by: Ellin Saba on: 07/03/2022 03:22 PM   Modules accepted: Orders

## 2022-07-07 ENCOUNTER — Telehealth: Payer: Self-pay | Admitting: "Endocrinology

## 2022-07-07 NOTE — Telephone Encounter (Signed)
Pt said that his PCP put him on Ozempic for weight loss. He said he is fixing to take his 3rd shot. He said he has lost 13 pounds in 2 weeks but he is asking if it is okay to take it with his other insulins. He said his sugar has been regulated. Please Advise. [phone]

## 2022-07-08 ENCOUNTER — Ambulatory Visit: Payer: 59 | Admitting: Orthopedic Surgery

## 2022-07-08 ENCOUNTER — Encounter: Payer: Self-pay | Admitting: Orthopedic Surgery

## 2022-07-08 VITALS — BP 151/89 | HR 77 | Ht 71.0 in | Wt 292.0 lb

## 2022-07-08 DIAGNOSIS — M79642 Pain in left hand: Secondary | ICD-10-CM | POA: Diagnosis not present

## 2022-07-08 NOTE — Progress Notes (Signed)
New Patient Visit  Assessment: Lance [name] is a 46 y.o. male with the following: 1. Pain in left hand  Plan: [name] has pain in his left hand, after a branch snapped back and impacted the hand.  There was some bleeding at that time.  He presented to clinic with x-rays from his primary care provider, and these are negative for fracture.  We do not need to immobilize his left hand.  Encouraged him to work on range of motion.  As his motion improves, the pain should improve.  He states his understanding.  He will follow-up as needed.  Follow-up: Return if symptoms worsen or fail to improve.  Subjective:  Chief Complaint  Patient presents with   Hand Injury    Pt states he had a tree limb left hand DOI 07/02/22    History of Present Illness: Lance [name] is a 46 y.o. male who has been referred by [name], PA-C for evaluation of left hand pain.  He was at work last week, when he moved to branch.  Another branch, roughly the caliber of his thumb was bent backwards and subsequently snapped back.  It impacted the dorsum of his hand.  He had immediate pain, and bleeding.  He was seen by his PCP, and x-rays were concerning for fracture.  Since then, he continues to have pain in his hands.  He is taking over-the-counter medications as needed.  He is unable to make a fist.   Review of Systems: No fevers or chills No numbness or tingling No chest pain No shortness of breath No bowel or bladder dysfunction No GI distress No headaches   Medical History:  Past Medical History:  Diagnosis Date   CAD (coronary artery disease), native coronary artery    10/18 PCI/DES to mRCA, and OM, with total occlusion of dLCx with collaterals.    Essential hypertension    GERD (gastroesophageal reflux disease)    History of kidney stones    Hyperlipemia    SVT (supraventricular tachycardia) 2015   Converted with adenosine   Type 2 diabetes mellitus ([place])     Past Surgical History:   Procedure Laterality Date   CARDIAC SURGERY     CORONARY ANGIOPLASTY WITH STENT PLACEMENT  07/15/2017   "2 stents"   CORONARY/GRAFT ACUTE MI REVASCULARIZATION N/A 03/12/2019   Procedure: Coronary/Graft Acute MI Revascularization;  Surgeon: Nelva Bush, MD;  Location: [facility];  Service: Cardiovascular;  Laterality: N/A;   LAPAROSCOPIC APPENDECTOMY N/A 11/12/2017   Procedure: APPENDECTOMY LAPAROSCOPIC, REPAIR OF INCARCERATED INCISIONAL HERNIA;  Surgeon: Michael Boston, MD;  [facility];  Service: General;  Laterality: N/A;   LAPAROSCOPIC CHOLECYSTECTOMY     LEFT HEART CATH AND CORONARY ANGIOGRAPHY N/A 07/15/2017   Procedure: LEFT HEART CATH AND CORONARY ANGIOGRAPHY;  Surgeon: Sherren Mocha, MD;  Location: [facility];  Service: Cardiovascular;  Laterality: N/A;   LEFT HEART CATH AND CORONARY ANGIOGRAPHY N/A 03/12/2019   Procedure: LEFT HEART CATH AND CORONARY ANGIOGRAPHY;  Surgeon: Nelva Bush, MD;  Location: [facility];  Service: Cardiovascular;  Laterality: N/A;   SVT ABLATION N/A 04/22/2021   Procedure: SVT ABLATION;  Surgeon: Evans Lance, MD;  Location: [facility];  Service: Cardiovascular;  Laterality: N/A;    Family History  Problem Relation Age of Onset   Heart disease Mother 66       CABG   Diabetes Mother    Gout Mother  Heart attack Father 83   Social History   Tobacco Use   Smoking status: Light Smoker    Packs/day: 0.50    Years: 26.00    Total pack years: 13.00    Types: Cigarettes    Last attempt to quit: 03/07/2019    Years since quitting: 3.3   Smokeless tobacco: Never  Vaping Use   Vaping Use: Never used  Substance Use Topics   Alcohol use: No   Drug use: No    Allergies  Allergen Reactions   Bee Venom Anaphylaxis and Swelling   Penicillins Hives, Itching and Rash    Has patient had a PCN reaction causing immediate rash, facial/tongue/throat swelling, SOB or lightheadedness with hypotension: No Has patient  had a PCN reaction causing severe rash involving mucus membranes or skin necrosis: No Has patient had a PCN reaction that required hospitalization: No Has patient had a PCN reaction occurring within the last 10 years: No If all of the above answers are "NO", then may proceed with Cephalosporin use.     No outpatient medications have been marked as taking for the 07/08/22 encounter (Office Visit) with [name], MD.    Objective: BP (!) 151/89   Pulse 77   Ht 5\' 11"  (1.803 m)   Wt 292 lb (132.5 kg)   BMI 40.73 kg/m   Physical Exam:  General: Alert and oriented. and No acute distress. Gait: Normal gait.  Left hand with minimal swelling.  Small scab in the middle of the dorsum of his hand.  He has tenderness to palpation along the shaft of the fifth metacarpal.  Sensation is intact otherwise.  Limited range of motion in the ulnar side of his hand due to pain.   IMAGING: I personally reviewed images previously obtained in clinic  X-rays were taken elsewhere, and provided in clinic today.  No acute injuries are noted.  No dislocation.  No evidence of a prior injury.   New Medications:  No orders of the defined types were placed in this encounter.     [name], MD  07/08/2022 10:23 AM

## 2022-07-31 ENCOUNTER — Encounter: Payer: Self-pay | Admitting: Cardiology

## 2022-07-31 ENCOUNTER — Ambulatory Visit: Payer: 59 | Attending: Cardiology | Admitting: Cardiology

## 2022-07-31 VITALS — BP 138/78 | HR 87 | Ht 71.0 in | Wt 293.0 lb

## 2022-07-31 DIAGNOSIS — I4719 Other supraventricular tachycardia: Secondary | ICD-10-CM | POA: Diagnosis not present

## 2022-07-31 DIAGNOSIS — E782 Mixed hyperlipidemia: Secondary | ICD-10-CM | POA: Diagnosis not present

## 2022-07-31 DIAGNOSIS — I25119 Atherosclerotic heart disease of native coronary artery with unspecified angina pectoris: Secondary | ICD-10-CM | POA: Diagnosis not present

## 2022-07-31 NOTE — Progress Notes (Signed)
Cardiology Office Note  Date: 07/31/2022   ID: Lance [name], DOB Aug 03, 1976, MRN [id number]  PCP:  Lanelle Bal, PA-C  Cardiologist:  Rozann Lesches, MD Electrophysiologist:  None   Chief Complaint  Patient presents with   Cardiac follow-up    History of Present Illness: Lance [name] is a 46 y.o. male last seen in November 2022.  He is here for a follow-up visit.  Reports no angina and stable NYHA class I-II dyspnea.  He continues to work full-time, has been very busy.  He did have a recent visit with Dr. Lovena Le in September, I reviewed the note.  He is status post successful radiofrequency ablation of AVNRT.  He reports only very brief palpitations at times lasting only a few seconds.  I reviewed his medications which are otherwise stable from a cardiac perspective.  He remains on long-term dual antiplatelet therapy.  No recent nitroglycerin use.  He has done well on high-dose Lipitor with recent LDL 61.  Past Medical History:  Diagnosis Date   CAD (coronary artery disease), native coronary artery    10/18 PCI/DES to mRCA, and OM, with total occlusion of dLCx with collaterals.    Essential hypertension    GERD (gastroesophageal reflux disease)    History of kidney stones    Hyperlipemia    SVT (supraventricular tachycardia) 2015   Converted with adenosine   Type 2 diabetes mellitus ([place])     Past Surgical History:  Procedure Laterality Date   CARDIAC SURGERY     CORONARY ANGIOPLASTY WITH STENT PLACEMENT  07/15/2017   "2 stents"   CORONARY/GRAFT ACUTE MI REVASCULARIZATION N/A 03/12/2019   Procedure: Coronary/Graft Acute MI Revascularization;  Surgeon: Nelva Bush, MD;  Location: [facility];  Service: Cardiovascular;  Laterality: N/A;   LAPAROSCOPIC APPENDECTOMY N/A 11/12/2017   Procedure: APPENDECTOMY LAPAROSCOPIC, REPAIR OF INCARCERATED INCISIONAL HERNIA;  Surgeon: Michael Boston, MD;  [facility];  Service: General;  Laterality: N/A;   LAPAROSCOPIC  CHOLECYSTECTOMY     LEFT HEART CATH AND CORONARY ANGIOGRAPHY N/A 07/15/2017   Procedure: LEFT HEART CATH AND CORONARY ANGIOGRAPHY;  Surgeon: Sherren Mocha, MD;  Location: [facility];  Service: Cardiovascular;  Laterality: N/A;   LEFT HEART CATH AND CORONARY ANGIOGRAPHY N/A 03/12/2019   Procedure: LEFT HEART CATH AND CORONARY ANGIOGRAPHY;  Surgeon: Nelva Bush, MD;  Location: [facility];  Service: Cardiovascular;  Laterality: N/A;   SVT ABLATION N/A 04/22/2021   Procedure: SVT ABLATION;  Surgeon: Evans Lance, MD;  Location: [facility];  Service: Cardiovascular;  Laterality: N/A;    Current Outpatient Medications  Medication Sig Dispense Refill   ALPRAZolam (XANAX) 1 MG tablet Take 1 mg by mouth daily as needed for anxiety.     aspirin EC 81 MG tablet Take 1 tablet (81 mg total) by mouth daily with breakfast. 120 tablet 5   atorvastatin (LIPITOR) 80 MG tablet TAKE 1 TABLET BY MOUTH EVERYDAY AT BEDTIME 90 tablet 1   B-D ULTRAFINE III SHORT PEN 31G X 8 MM MISC 1 EACH BY DOES NOT APPLY ROUTE 4 (FOUR) TIMES DAILY. 100 each 2   blood glucose meter kit and supplies KIT 1 each by Does not apply route 4 (four) times daily. Dispense based on patient and insurance preference. Use up to four times daily as directed. (FOR ICD-9 250.00, 250.01). 1 each 0   BRILINTA 60 MG TABS tablet TAKE 1 TABLET BY MOUTH TWICE A DAY 180 tablet  2   Continuous Blood Gluc Sensor (DEXCOM G7 SENSOR) MISC Change sensor every 10 days 3 each 2   insulin aspart (NOVOLOG FLEXPEN) 100 UNIT/ML FlexPen Inject 20-26 Units into the skin 3 (three) times daily with meals. Sliding scale 90 mL 1   Insulin Glargine (BASAGLAR KWIKPEN) 100 UNIT/ML Inject 80 Units into the skin at bedtime. (Patient taking differently: Inject 70 Units into the skin at bedtime.) 90 mL 1   lisinopril (ZESTRIL) 40 MG tablet TAKE 1 TABLET BY MOUTH EVERY DAY 90 tablet 1   nitroGLYCERIN (NITROSTAT) 0.4 MG SL tablet Place 1 tablet (0.4 mg  total) under the tongue every 5 (five) minutes x 3 doses as needed for chest pain. 25 tablet 3   Omega-3 1000 MG CAPS Take 1,000 mg by mouth daily.     No current facility-administered medications for this visit.   Allergies:  Bee venom and Penicillins   ROS: No syncope.  Physical Exam: VS:  BP 138/78   Pulse 87   Ht '5\' 11"'  (1.803 m)   Wt 293 lb (132.9 kg)   SpO2 94%   BMI 40.87 kg/m , BMI Body mass index is 40.87 kg/m.  Wt Readings from Last 3 Encounters:  07/31/22 293 lb (132.9 kg)  07/08/22 292 lb (132.5 kg)  06/24/22 296 lb 6.4 oz (134.4 kg)    General: Patient appears comfortable at rest. HEENT: Conjunctiva and lids normal. Neck: Supple, no elevated JVP or carotid bruits. Lungs: Clear to auscultation, nonlabored breathing at rest. Cardiac: Regular rate and rhythm, no S3 or significant systolic murmur. Extremities: No pitting edema.  ECG:  An ECG dated 06/24/2022 was personally reviewed today and demonstrated:  Sinus rhythm.  Recent Labwork: 02/18/2022: ALT 37; AST 25; BUN 13; Creatinine, Ser 1.09; Potassium 4.7; Sodium 137; TSH 1.610     Component Value Date/Time   CHOL 112 02/18/2022 0756   TRIG 192 (H) 02/18/2022 0756   HDL 19 (L) 02/18/2022 0756   CHOLHDL 5.9 (H) 02/18/2022 0756   CHOLHDL 5.5 (H) 10/11/2019 0856   VLDL 47 (H) 03/13/2019 0547   LDLCALC 61 02/18/2022 0756   LDLCALC 63 10/11/2019 0856    Other Studies Reviewed Today:  Echocardiogram 07/26/2019:  1. Left ventricular ejection fraction, by visual estimation, is 65 to  70%. The left ventricle has hyperdynamic function. There is mildly  increased left ventricular posterior wall hypertrophy.   2. Elevated left ventricular end-diastolic pressure.   3. Left ventricular diastolic Doppler parameters are consistent with  impaired relaxation pattern of LV diastolic filling.   4. Global right ventricle has normal systolic function.The right  ventricular size is normal. Right vetricular wall thickness  was not  assessed.   5. Left atrial size was normal.   6. Right atrial size was normal.   7. The mitral valve is grossly normal. Mild mitral valve regurgitation.   8. The tricuspid valve is grossly normal. Tricuspid valve regurgitation  is trivial.   9. The aortic valve is tricuspid Aortic valve regurgitation was not  visualized by color flow Doppler. Mild aortic valve sclerosis without  stenosis.  10. The pulmonic valve was grossly normal. Pulmonic valve regurgitation is  not visualized by color flow Doppler.  11. Normal pulmonary artery systolic pressure.  12. The inferior vena cava IVC is small suggestive of low RAP.   Assessment and Plan:  1.  CAD status post multiple percutaneous coronary interventions, most recently DES to the LAD and staged angioplasty of restenosed OM in June  2020.  Fortunately, continues to do well without recurrent angina.  Continues on aspirin and Brilinta long-term, also lisinopril, Lipitor, and as needed nitroglycerin.  2.  Mixed hyperlipidemia, on Lipitor and omega-3 supplements.  Most recent LDL 61.  3.  History of AVNRT status post successful slow pathway radiofrequency ablation by Dr. Lovena Le.  He continues to do well.  Medication Adjustments/Labs and Tests Ordered: Current medicines are reviewed at length with the patient today.  Concerns regarding medicines are outlined above.   Tests Ordered: No orders of the defined types were placed in this encounter.   Medication Changes: No orders of the defined types were placed in this encounter.   Disposition:  Follow up  1 year.  Signed, Satira Sark, MD, [facility] 07/31/2022 2:44 PM    [facility] [address], [place], [place] [address] Phone: [phone]; Fax: [phone]

## 2022-07-31 NOTE — Patient Instructions (Signed)
Medication Instructions:  Your physician recommends that you continue on your current medications as directed. Please refer to the Current Medication list given to you today.   Labwork: none  Testing/Procedures: none  Follow-Up:  Your physician recommends that you schedule a follow-up appointment in: 1 year. You will receive a call in about 10 months reminding you to schedule your appointment. If you do not receive this call, please contact our office.   Any Other Special Instructions Will Be Listed Below (If Applicable).  If you need a refill on your cardiac medications before your next appointment, please call your pharmacy.  

## 2022-09-09 ENCOUNTER — Other Ambulatory Visit: Payer: Self-pay | Admitting: Cardiology

## 2022-09-23 LAB — LIPID PANEL
Chol/HDL Ratio: 5.8 ratio — ABNORMAL HIGH (ref 0.0–5.0)
Cholesterol, Total: 111 mg/dL (ref 100–199)
HDL: 19 mg/dL — ABNORMAL LOW (ref >39)
LDL Chol Calc (NIH): 59 mg/dL (ref 0–99)
Triglycerides: 197 mg/dL — ABNORMAL HIGH (ref 0–149)
VLDL Cholesterol Cal: 33 mg/dL (ref 5–40)

## 2022-09-23 LAB — COMPREHENSIVE METABOLIC PANEL
ALT: 29 IU/L (ref 0–44)
AST: 17 IU/L (ref 0–40)
Albumin/Globulin Ratio: 1.8 (ref 1.2–2.2)
Albumin: 4.1 g/dL (ref 4.1–5.1)
Alkaline Phosphatase: 114 IU/L (ref 44–121)
BUN/Creatinine Ratio: 13 (ref 9–20)
BUN: 16 mg/dL (ref 6–24)
Bilirubin Total: 0.6 mg/dL (ref 0.0–1.2)
CO2: 25 mmol/L (ref 20–29)
Calcium: 9.2 mg/dL (ref 8.7–10.2)
Chloride: 100 mmol/L (ref 96–106)
Creatinine, Ser: 1.21 mg/dL (ref 0.76–1.27)
Globulin, Total: 2.3 g/dL (ref 1.5–4.5)
Glucose: 119 mg/dL — ABNORMAL HIGH (ref 70–99)
Potassium: 4.9 mmol/L (ref 3.5–5.2)
Sodium: 139 mmol/L (ref 134–144)
Total Protein: 6.4 g/dL (ref 6.0–8.5)
eGFR: 75 mL/min/{1.73_m2} (ref >59)

## 2022-09-23 LAB — TSH: TSH: 1.48 u[IU]/mL (ref 0.450–4.500)

## 2022-09-23 LAB — T4, FREE: Free T4: 1.2 ng/dL (ref 0.82–1.77)

## 2022-09-25 ENCOUNTER — Ambulatory Visit: Payer: 59 | Admitting: "Endocrinology

## 2022-09-30 ENCOUNTER — Ambulatory Visit: Payer: 59 | Admitting: "Endocrinology

## 2022-10-05 ENCOUNTER — Other Ambulatory Visit: Payer: Self-pay

## 2022-10-05 ENCOUNTER — Encounter (HOSPITAL_COMMUNITY): Payer: Self-pay | Admitting: *Deleted

## 2022-10-05 ENCOUNTER — Emergency Department (HOSPITAL_COMMUNITY)
Admission: EM | Admit: 2022-10-05 | Discharge: 2022-10-05 | Disposition: A | Discharge status: Home / Self Care | Payer: 59 | Attending: Emergency Medicine | Admitting: Emergency Medicine

## 2022-10-05 DIAGNOSIS — J101 Influenza due to other identified influenza virus with other respiratory manifestations: Secondary | ICD-10-CM | POA: Diagnosis not present

## 2022-10-05 DIAGNOSIS — I2510 Atherosclerotic heart disease of native coronary artery without angina pectoris: Secondary | ICD-10-CM | POA: Insufficient documentation

## 2022-10-05 DIAGNOSIS — Z794 Long term (current) use of insulin: Secondary | ICD-10-CM | POA: Diagnosis not present

## 2022-10-05 DIAGNOSIS — Z7982 Long term (current) use of aspirin: Secondary | ICD-10-CM | POA: Insufficient documentation

## 2022-10-05 DIAGNOSIS — R002 Palpitations: Secondary | ICD-10-CM | POA: Diagnosis present

## 2022-10-05 DIAGNOSIS — Z79899 Other long term (current) drug therapy: Secondary | ICD-10-CM | POA: Diagnosis not present

## 2022-10-05 DIAGNOSIS — I10 Essential (primary) hypertension: Secondary | ICD-10-CM | POA: Diagnosis not present

## 2022-10-05 DIAGNOSIS — E119 Type 2 diabetes mellitus without complications: Secondary | ICD-10-CM | POA: Insufficient documentation

## 2022-10-05 DIAGNOSIS — J069 Acute upper respiratory infection, unspecified: Secondary | ICD-10-CM

## 2022-10-05 DIAGNOSIS — Z1152 Encounter for screening for COVID-19: Secondary | ICD-10-CM | POA: Insufficient documentation

## 2022-10-05 LAB — RESP PANEL BY RT-PCR (RSV, FLU A&B, COVID)  RVPGX2
Influenza A by PCR: POSITIVE — AB
Influenza B by PCR: NEGATIVE
Resp Syncytial Virus by PCR: NEGATIVE
SARS Coronavirus 2 by RT PCR: NEGATIVE

## 2022-10-05 LAB — CBC
HCT: 42.6 % (ref 39.0–52.0)
Hemoglobin: 13.8 g/dL (ref 13.0–17.0)
MCH: 27.4 pg (ref 26.0–34.0)
MCHC: 32.4 g/dL (ref 30.0–36.0)
MCV: 84.7 fL (ref 80.0–100.0)
Platelets: 149 10*3/uL — ABNORMAL LOW (ref 150–400)
RBC: 5.03 MIL/uL (ref 4.22–5.81)
RDW: 14.6 % (ref 11.5–15.5)
WBC: 5.8 10*3/uL (ref 4.0–10.5)
nRBC: 0 % (ref 0.0–0.2)

## 2022-10-05 LAB — BASIC METABOLIC PANEL
Anion gap: 6 (ref 5–15)
BUN: 13 mg/dL (ref 6–20)
CO2: 25 mmol/L (ref 22–32)
Calcium: 8.3 mg/dL — ABNORMAL LOW (ref 8.9–10.3)
Chloride: 104 mmol/L (ref 98–111)
Creatinine, Ser: 1.02 mg/dL (ref 0.61–1.24)
GFR, Estimated: >60 mL/min (ref >60)
Glucose, Bld: 240 mg/dL — ABNORMAL HIGH (ref 70–99)
Potassium: 3.9 mmol/L (ref 3.5–5.1)
Sodium: 135 mmol/L (ref 135–145)

## 2022-10-05 LAB — TROPONIN I (HIGH SENSITIVITY)
Troponin I (High Sensitivity): 10 ng/L (ref <18)
Troponin I (High Sensitivity): 14 ng/L (ref <18)

## 2022-10-05 LAB — BRAIN NATRIURETIC PEPTIDE: B Natriuretic Peptide: 25 pg/mL (ref 0.0–100.0)

## 2022-10-05 LAB — MAGNESIUM: Magnesium: 1.9 mg/dL (ref 1.7–2.4)

## 2022-10-05 LAB — TSH: TSH: 2.45 u[IU]/mL (ref 0.350–4.500)

## 2022-10-05 NOTE — ED Provider Notes (Signed)
[facility] EMERGENCY DEPARTMENT Provider Note   CSN: [id number] Arrival date & time: 10/05/22  1829     History  Chief Complaint  Patient presents with   Palpitations    Lance [name] is a 46 y.o. male.  46 year old male with a history of CAD status post PCI, HTN, HLD, DM 2, and SVT who presents emergency department with hot flashes.  Patient states that today he has had 4 episode of abnormal sensations where his entire body will feel hot and cold.  Starts in his feet and then goes up to the rest of his body.  Had a similar sensation with his heart attack so tried nitroglycerin which she thinks may have improved his symptoms and came to the emergency department for evaluation.  No chest pain.  Unsure if he has been having palpitations with it.  No nausea or vomiting.  Says that he recently had a URI and is still having congestion with cough.  No shortness of breath.  Has been taking Claritin, nasal spray, Tylenol sinus at home for his URI.  No lower extremity swelling.  No history of DVT or PE.  Says that he has been compliant with his aspirin and Brilinta.       Home Medications Prior to Admission medications   Medication Sig Start Date End Date Taking? Authorizing Provider  ALPRAZolam Duanne Moron) 1 MG tablet Take 1 mg by mouth daily as needed for anxiety.    [provider]  aspirin EC 81 MG tablet Take 1 tablet (81 mg total) by mouth daily with breakfast. 04/05/19   Denton Brick, Courage, MD  atorvastatin (LIPITOR) 80 MG tablet TAKE 1 TABLET BY MOUTH EVERYDAY AT BEDTIME 09/09/22   Satira Sark, MD  B-D ULTRAFINE III SHORT PEN 31G X 8 MM MISC 1 EACH BY DOES NOT APPLY ROUTE 4 (FOUR) TIMES DAILY. 12/10/21   Cassandria Anger, MD  blood glucose meter kit and supplies KIT 1 each by Does not apply route 4 (four) times daily. Dispense based on patient and insurance preference. Use up to four times daily as directed. (FOR ICD-9 250.00, 250.01). 02/14/20   Cassandria Anger, MD   BRILINTA 60 MG TABS tablet TAKE 1 TABLET BY MOUTH TWICE A DAY 03/17/22   Satira Sark, MD  Continuous Blood Gluc Sensor (DEXCOM G7 SENSOR) MISC Change sensor every 10 days 07/03/22   Cassandria Anger, MD  insulin aspart (NOVOLOG FLEXPEN) 100 UNIT/ML FlexPen Inject 20-26 Units into the skin 3 (three) times daily with meals. Sliding scale 05/29/22   Cassandria Anger, MD  Insulin Glargine [facility] KWIKPEN) 100 UNIT/ML Inject 80 Units into the skin at bedtime. Patient taking differently: Inject 70 Units into the skin at bedtime. 05/29/22   Cassandria Anger, MD  lisinopril (ZESTRIL) 40 MG tablet TAKE 1 TABLET BY MOUTH EVERY DAY 06/11/22   Evans Lance, MD  nitroGLYCERIN (NITROSTAT) 0.4 MG SL tablet Place 1 tablet (0.4 mg total) under the tongue every 5 (five) minutes x 3 doses as needed for chest pain. 09/02/21   Satira Sark, MD  Omega-3 1000 MG CAPS Take 1,000 mg by mouth daily.    [provider]      Allergies    Bee venom and Penicillins    Review of Systems   Review of Systems  Physical Exam Updated Vital Signs BP 138/68   Pulse 60   Resp 18   Ht _0  (1.803 m)   Wt 129.3  kg   SpO2 100%   BMI 39.75 kg/m  Physical Exam Vitals and nursing note reviewed.  Constitutional:      General: He is not in acute distress.    Appearance: He is well-developed.  HENT:     Head: Normocephalic and atraumatic.     Right Ear: External ear normal.     Left Ear: External ear normal.     Nose: Nose normal.  Eyes:     Extraocular Movements: Extraocular movements intact.     Conjunctiva/sclera: Conjunctivae normal.     Pupils: Pupils are equal, round, and reactive to light.  Cardiovascular:     Rate and Rhythm: Normal rate and regular rhythm.     Heart sounds: Normal heart sounds.  Pulmonary:     Effort: Pulmonary effort is normal. No respiratory distress.     Breath sounds: Normal breath sounds.  Abdominal:     General: There is no distension.      Palpations: Abdomen is soft. There is no mass.     Tenderness: There is no abdominal tenderness. There is no guarding.  Musculoskeletal:     Cervical back: Normal range of motion and neck supple.     Right lower leg: No edema.     Left lower leg: No edema.  Skin:    General: Skin is warm and dry.  Neurological:     Mental Status: He is alert. Mental status is at baseline.  Psychiatric:        Mood and Affect: Mood normal.        Behavior: Behavior normal.     ED Results / Procedures / Treatments   Labs (all labs ordered are listed, but only abnormal results are displayed) Labs Reviewed  RESP PANEL BY RT-PCR (RSV, FLU A&B, COVID)  RVPGX2 - Abnormal; Notable for the following components:      Result Value   Influenza A by PCR POSITIVE (*)    All other components within normal limits  BASIC METABOLIC PANEL - Abnormal; Notable for the following components:   Glucose, Bld 240 (*)    Calcium 8.3 (*)    All other components within normal limits  CBC - Abnormal; Notable for the following components:   Platelets 149 (*)    All other components within normal limits  MAGNESIUM  TSH  BRAIN NATRIURETIC PEPTIDE  TROPONIN I (HIGH SENSITIVITY)  TROPONIN I (HIGH SENSITIVITY)    EKG EKG Interpretation  Date/Time:  Sunday October 05 2022 11:53:48 EST Ventricular Rate:  65 PR Interval:  192 QRS Duration: 94 QT Interval:  416 QTC Calculation: 433 R Axis:   91 Text Interpretation: Sinus rhythm Borderline right axis deviation Confirmed by Margaretmary Eddy [phone]) on 10/05/2022 12:05:28 PM  Radiology No results found.  Procedures Procedures   Medications Ordered in ED Medications - No data to display  ED Course/ Medical Decision Making/ A&P                           Medical Decision Making Amount and/or Complexity of Data Reviewed Labs: ordered.   Lance [name] is a 46 y.o. male with comorbidities that complicate the patient evaluation including CAD status post PCI, HTN,  HLD, DM 2, and SVT who presents emergency department with hot flashes.    Initial Ddx:  MI, URI, nonspecific viral illness  MDM:  Feel the patient is likely having chills from a URI.  With his history of MI we  will obtain EKG and troponins.  However, feel this is less likely as he is not having any chest pain, shortness of breath, or vomiting.  Of note, initial triage note described palpitation but patient denies this to me.  Plan:  Labs Troponin EKG COVID/flu  ED Summary/Re-evaluation:  Patient reevaluated and was stable.  Did not develop chest pain or shortness of breath while in the emergency department.  EKG and serial troponins were WNL.  Patient was found to have influenza which I suspect is the cause of his symptoms.  This patient presents to the ED for concern of complaints listed in HPI, this involves an extensive number of treatment options, and is a complaint that carries with it a high risk of complications and morbidity. Disposition including potential need for admission considered.   Dispo: DC Home. Return precautions discussed including, but not limited to, those listed in the AVS. Allowed pt time to ask questions which were answered fully prior to dc.  Additional history obtained from family Records reviewed Outpatient Clinic Notes The following labs were independently interpreted: Serial Troponins and show no acute abnormality I personally reviewed and interpreted cardiac monitoring: normal sinus rhythm  I personally reviewed and interpreted the pt's EKG: see above for interpretation  I have reviewed the patients home medications and made adjustments as needed  Final Clinical Impression(s) / ED Diagnoses Final diagnoses:  Upper respiratory tract infection, unspecified type  Coronary artery disease involving native coronary artery of native heart without angina pectoris    Rx / DC Orders ED Discharge Orders          Ordered    Ambulatory referral to Cardiology         10/05/22 1341              Fransico Meadow, MD 10/05/22 2103

## 2022-10-05 NOTE — Discharge Instructions (Signed)
You were seen for the flu and your heart in the emergency department.   At home, please take tylenol and over the counter medications for your flu like symptoms.    Follow-up with your primary doctor in 2-3 days regarding your visit.  Call your cardiologist about follow-up as well.   Return immediately to the emergency department if you experience any of the following: chest pain, difficulty breathing, unexplained sweating, or any other concerning symptoms.    Thank you for visiting our Emergency Department. It was a pleasure taking care of you today.

## 2022-10-05 NOTE — ED Triage Notes (Signed)
Pt states he started having palpitations this am while sitting in a drive-thru; pt state he took a nitro and the pain felt better  Pt states he has had a cold and has been taking cold medications

## 2022-10-25 ENCOUNTER — Other Ambulatory Visit: Payer: Self-pay | Admitting: "Endocrinology

## 2022-10-25 DIAGNOSIS — E1159 Type 2 diabetes mellitus with other circulatory complications: Secondary | ICD-10-CM

## 2022-10-29 ENCOUNTER — Ambulatory Visit: Payer: 59 | Admitting: "Endocrinology

## 2022-10-29 ENCOUNTER — Encounter: Payer: Self-pay | Admitting: "Endocrinology

## 2022-10-29 VITALS — BP 154/90 | HR 84 | Ht 71.0 in | Wt 294.6 lb

## 2022-10-29 DIAGNOSIS — I10 Essential (primary) hypertension: Secondary | ICD-10-CM

## 2022-10-29 DIAGNOSIS — E1159 Type 2 diabetes mellitus with other circulatory complications: Secondary | ICD-10-CM | POA: Diagnosis not present

## 2022-10-29 DIAGNOSIS — E782 Mixed hyperlipidemia: Secondary | ICD-10-CM | POA: Diagnosis not present

## 2022-10-29 LAB — POCT GLYCOSYLATED HEMOGLOBIN (HGB A1C): HbA1c, POC (controlled diabetic range): 7.2 % — AB (ref 0.0–7.0)

## 2022-10-29 MED ORDER — BASAGLAR KWIKPEN 100 UNIT/ML ~~LOC~~ SOPN: 70.0000 [IU] | PEN_INJECTOR | Freq: Every day | SUBCUTANEOUS | 2 refills | Status: DC

## 2022-10-29 MED ORDER — NOVOLOG FLEXPEN 100 UNIT/ML ~~LOC~~ SOPN: 16.0000 [IU] | PEN_INJECTOR | Freq: Three times a day (TID) | SUBCUTANEOUS | 1 refills | Status: DC

## 2022-10-29 NOTE — Progress Notes (Signed)
10/29/2022  Endocrinology follow-up note  Subjective:    Patient ID: Lance [name], male   DOB: May 17, 1976.  Lance [name] is being seen in follow-up for management of currently uncontrolled type 2 diabetes, complicated by coronary artery disease recently which required stent placement, hyperlipidemia, hypertension.   Past Medical History:  Diagnosis Date   CAD (coronary artery disease), native coronary artery    10/18 PCI/DES to mRCA, and OM, with total occlusion of dLCx with collaterals.    Essential hypertension    GERD (gastroesophageal reflux disease)    History of kidney stones    Hyperlipemia    SVT (supraventricular tachycardia) 2015   Converted with adenosine   Type 2 diabetes mellitus ([place])    Past Surgical History:  Procedure Laterality Date   CARDIAC SURGERY     CORONARY ANGIOPLASTY WITH STENT PLACEMENT  07/15/2017   "2 stents"   CORONARY/GRAFT ACUTE MI REVASCULARIZATION N/A 03/12/2019   Procedure: Coronary/Graft Acute MI Revascularization;  Surgeon: Nelva Bush, MD;  Location: [facility];  Service: Cardiovascular;  Laterality: N/A;   LAPAROSCOPIC APPENDECTOMY N/A 11/12/2017   Procedure: APPENDECTOMY LAPAROSCOPIC, REPAIR OF INCARCERATED INCISIONAL HERNIA;  Surgeon: Michael Boston, MD;  Location: [facility];  Service: General;  Laterality: N/A;   LAPAROSCOPIC CHOLECYSTECTOMY     LEFT HEART CATH AND CORONARY ANGIOGRAPHY N/A 07/15/2017   Procedure: LEFT HEART CATH AND CORONARY ANGIOGRAPHY;  Surgeon: Sherren Mocha, MD;  Location: [facility];  Service: Cardiovascular;  Laterality: N/A;   LEFT HEART CATH AND CORONARY ANGIOGRAPHY N/A 03/12/2019   Procedure: LEFT HEART CATH AND CORONARY ANGIOGRAPHY;  Surgeon: Nelva Bush, MD;  Location: [facility];  Service: Cardiovascular;  Laterality: N/A;   SVT ABLATION N/A 04/22/2021   Procedure: SVT ABLATION;  Surgeon: Evans Lance, MD;  Location: [facility];  Service: Cardiovascular;  Laterality: N/A;    Social History   Socioeconomic History   Marital status: Married    Spouse name: Not on file   Number of children: Not on file   Years of education: Not on file   Highest education level: Not on file  Occupational History   Occupation: Superintendent    Employer: [employer]  Tobacco Use   Smoking status: Light Smoker    Packs/day: 0.50    Years: 26.00    Total pack years: 13.00    Types: Cigarettes    Last attempt to quit: 03/07/2019    Years since quitting: 3.6   Smokeless tobacco: Never  Vaping Use   Vaping Use: Never used  Substance and Sexual Activity   Alcohol use: No   Drug use: No   Sexual activity: Yes    Birth control/protection: None  Other Topics Concern   Not on file  Social History Narrative   Not on file   Social Determinants of Health   Financial Resource Strain: Not on file  Food Insecurity: Not on file  Transportation Needs: Not on file  Physical Activity: Not on file  Stress: Not on file  Social Connections: Not on file   Outpatient Encounter Medications as of 10/29/2022  Medication Sig   Semaglutide,0.25 or 0.5MG /DOS, (OZEMPIC, 0.25 OR 0.5 MG/DOSE,) 2 MG/1.5ML SOPN Inject 0.25 mg into the skin once a week.   ALPRAZolam (XANAX) 1 MG tablet Take 1 mg by mouth daily as needed for anxiety.   aspirin EC 81 MG tablet Take 1 tablet (81 mg total) by mouth daily with breakfast.   atorvastatin (LIPITOR)  80 MG tablet TAKE 1 TABLET BY MOUTH EVERYDAY AT BEDTIME   B-D ULTRAFINE III SHORT PEN 31G X 8 MM MISC 1 EACH BY DOES NOT APPLY ROUTE 4 (FOUR) TIMES DAILY.   blood glucose meter kit and supplies KIT 1 each by Does not apply route 4 (four) times daily. Dispense based on patient and insurance preference. Use up to four times daily as directed. (FOR ICD-9 250.00, 250.01).   BRILINTA 60 MG TABS tablet TAKE 1 TABLET BY MOUTH TWICE A DAY   Continuous Blood Gluc Sensor (DEXCOM G7 SENSOR) MISC CHANGE SENSOR EVERY 10 DAYS   insulin aspart (NOVOLOG FLEXPEN)  100 UNIT/ML FlexPen Inject 16-22 Units into the skin 3 (three) times daily with meals. Sliding scale   Insulin Glargine (BASAGLAR KWIKPEN) 100 UNIT/ML Inject 70 Units into the skin at bedtime.   lisinopril (ZESTRIL) 40 MG tablet TAKE 1 TABLET BY MOUTH EVERY DAY   nitroGLYCERIN (NITROSTAT) 0.4 MG SL tablet Place 1 tablet (0.4 mg total) under the tongue every 5 (five) minutes x 3 doses as needed for chest pain.   Omega-3 1000 MG CAPS Take 1,000 mg by mouth daily.   [DISCONTINUED] insulin aspart (NOVOLOG FLEXPEN) 100 UNIT/ML FlexPen Inject 20-26 Units into the skin 3 (three) times daily with meals. Sliding scale   [DISCONTINUED] Insulin Glargine (BASAGLAR KWIKPEN) 100 UNIT/ML Inject 80 Units into the skin at bedtime. (Patient taking differently: Inject 70 Units into the skin at bedtime.)   No facility-administered encounter medications on file as of 10/29/2022.   ALLERGIES: Allergies  Allergen Reactions   Bee Venom Anaphylaxis and Swelling   Penicillins Hives, Itching and Rash    Has patient had a PCN reaction causing immediate rash, facial/tongue/throat swelling, SOB or lightheadedness with hypotension: No Has patient had a PCN reaction causing severe rash involving mucus membranes or skin necrosis: No Has patient had a PCN reaction that required hospitalization: No Has patient had a PCN reaction occurring within the last 10 years: No If all of the above answers are "NO", then may proceed with Cephalosporin use.    VACCINATION STATUS:  There is no immunization history on file for this patient.  Diabetes He presents for his follow-up diabetic visit. He has type 2 diabetes mellitus. Onset time: He was diagnosed at approximate age of 42 years. His disease course has been improving (Was recently hospitalized due to appendicitis after 2 months of antibiotic treatment.  He is status post appendectomy.  ). There are no hypoglycemic associated symptoms. Pertinent negatives for hypoglycemia include no  confusion, headaches, pallor or seizures. Pertinent negatives for diabetes include no blurred vision, no chest pain, no fatigue, no polydipsia, no polyphagia, no polyuria and no weakness. There are no hypoglycemic complications. Symptoms are improving. There are no diabetic complications. Risk factors for coronary artery disease include diabetes mellitus, dyslipidemia, family history, hypertension, male sex, obesity, tobacco exposure and sedentary lifestyle. Current diabetic treatment includes intensive insulin program (He is on Levemir 68 units BID and Humalog 10 units TIDAC.). His weight is increasing steadily. He is following a generally unhealthy diet. When asked about meal planning, he reported none. He has not had a previous visit with a dietitian. He rarely participates in exercise. His home blood glucose trend is decreasing steadily. His breakfast blood glucose range is generally 140-180 mg/dl. His lunch blood glucose range is generally 140-180 mg/dl. His dinner blood glucose range is generally 140-180 mg/dl. His bedtime blood glucose range is generally 140-180 mg/dl. His overall blood glucose range  is 140-180 mg/dl. Lance [name] presents with continued improvement in his glycemic profile.  His freestyle [place] was analyzed and showing 84% time in range, 15% level 1 hyperglycemia, no significant hypoglycemia.  He is point-of-care A1c 7.2%, continuous improvement overall from 11.9%.   ) An ACE inhibitor/angiotensin II receptor blocker is being taken. He does not see a podiatrist.Eye exam is not current.  Hyperlipidemia This is a chronic problem. The current episode started more than 1 year ago. The problem is controlled. Exacerbating diseases include diabetes and obesity. Pertinent negatives include no chest pain, myalgias or shortness of breath. Current antihyperlipidemic treatment includes statins and bile acid squestrants. The current treatment provides significant improvement of lipids. Risk factors for  coronary artery disease include diabetes mellitus, dyslipidemia, hypertension, male sex, obesity and a sedentary lifestyle.  Hypertension This is a chronic problem. The current episode started more than 1 year ago. Pertinent negatives include no blurred vision, chest pain, headaches, neck pain, palpitations or shortness of breath. Risk factors for coronary artery disease include dyslipidemia, diabetes mellitus, male gender, smoking/tobacco exposure and sedentary lifestyle. Past treatments include ACE inhibitors. Hypertensive end-organ damage includes CAD/MI.   Review of systems  Constitutional: + Minimally fluctuating body weight,  current  Body mass index is 41.09 kg/m. , no fatigue, no subjective hyperthermia, no subjective hypothermia    Objective:    BP (!) 154/90   Pulse 84   Ht 5\' 11"  (1.803 m)   Wt 294 lb 9.6 oz (133.6 kg)   BMI 41.09 kg/m   Wt Readings from Last 3 Encounters:  10/29/22 294 lb 9.6 oz (133.6 kg)  10/05/22 285 lb (129.3 kg)  07/31/22 293 lb (132.9 kg)      Physical Exam- Limited  Constitutional:  Body mass index is 41.09 kg/m. , not in acute distress  CMP ( most recent) CMP     Component Value Date/Time   NA 135 10/05/2022 1006   NA 139 09/22/2022 0756   K 3.9 10/05/2022 1006   CL 104 10/05/2022 1006   CO2 25 10/05/2022 1006   GLUCOSE 240 (H) 10/05/2022 1006   BUN 13 10/05/2022 1006   BUN 16 09/22/2022 0756   CREATININE 1.02 10/05/2022 1006   CREATININE 0.91 04/30/2020 0728   CALCIUM 8.3 (L) 10/05/2022 1006   PROT 6.4 09/22/2022 0756   ALBUMIN 4.1 09/22/2022 0756   AST 17 09/22/2022 0756   ALT 29 09/22/2022 0756   ALKPHOS 114 09/22/2022 0756   BILITOT 0.6 09/22/2022 0756   GFRNONAA >60 10/05/2022 1006   GFRNONAA 102 04/30/2020 0728   GFRAA 118 04/30/2020 0728     Diabetic Labs (most recent): Lab Results  Component Value Date   HGBA1C 7.2 (A) 10/29/2022   HGBA1C 7.7 (A) 05/29/2022   HGBA1C 7.8 (A) 02/20/2022   MICROALBUR 1.0  04/30/2020   MICROALBUR 0.4 10/11/2019   MICROALBUR 0.8 04/19/2018    Lipid Panel     Component Value Date/Time   CHOL 111 09/22/2022 0756   TRIG 197 (H) 09/22/2022 0756   HDL 19 (L) 09/22/2022 0756   CHOLHDL 5.8 (H) 09/22/2022 0756   CHOLHDL 5.5 (H) 10/11/2019 0856   VLDL 47 (H) 03/13/2019 0547   LDLCALC 59 09/22/2022 0756   LDLCALC 63 10/11/2019 0856     Assessment & Plan:   1. Uncontrolled type 2 diabetes mellitus with chronic kidney disease, coronary artery disease, with long-term current use of insulin. Lance [name] presents with continued improvement in his glycemic profile.  His freestyle [place]  was analyzed and showing 84% time in range, 15% level 1 hyperglycemia, no significant hypoglycemia.  He is point-of-care A1c 7.2%, continuous improvement overall from 11.9%.     Recent labs reviewed. -his diabetes is complicated by obesity/sedentary life, chronic heavy smoking and Lance [name] remains at a high risk for more acute and chronic complications which include CAD, CVA, CKD, retinopathy, and neuropathy. These are all discussed in detail with the patient.  - I have counseled him on diet management and weight loss, by adopting a carbohydrate restricted/protein rich diet. -He is partially disengaged from lifestyle medicine.  He will still benefit from lifestyle medicine.   - he acknowledges that there is a room for improvement in his food and drink choices. - Suggestion is made for him to avoid simple carbohydrates  from his diet including Cakes, Sweet Desserts, Ice Cream, Soda (diet and regular), Sweet Tea, Candies, Chips, Cookies, Store Bought Juices, Alcohol , Artificial Sweeteners,  Coffee Creamer, and "Sugar-free" Products, Lemonade. This will help patient to have more stable blood glucose profile and potentially avoid unintended weight gain.  The following Lifestyle Medicine recommendations according to American College of Lifestyle Medicine  [facility]) were discussed and and  offered to patient and he  agrees to start the journey:  A. Whole Foods, Plant-Based Nutrition comprising of fruits and vegetables, plant-based proteins, whole-grain carbohydrates was discussed in detail with the patient.   A list for source of those nutrients were also provided to the patient.  Patient will use only water or unsweetened tea for hydration. B.  The need to stay away from risky substances including alcohol, smoking; obtaining 7 to 9 hours of restorative sleep, at least 150 minutes of moderate intensity exercise weekly, the importance of healthy social connections,  and stress management techniques were discussed. C.  A full color page of  Calorie density of various food groups per pound showing examples of each food groups was provided to the patient.   - he is advised to stick to a routine mealtimes to eat 3 meals  a day and avoid unnecessary snacks ( to snack only to correct hypoglycemia).   - I have approached him with the following individualized plan to manage diabetes and patient agrees:   -Because 2 of his friends have kidney failure, he decided to stop his metformin.   -He is skeptical of GLP-1 receptor agonists.  He will continue to require intensive treatment with basal/bolus insulin in order for him to achieve and maintain control of diabetes to target.   -He is advised to lower Basaglar to 70 units nightly, lower NovoLog to 16  units 3 times daily before meals, associated with strict monitoring of blood glucose 4 times a day-before meals and at bedtime . -He is advised to use his CGM at all times, especially monitoring before meals and at bedtime.   He has Ozempic 0.25 mg weekly prescription in place at home.  He is advised to resume Ozempic 0.25 mg subcutaneously weekly.  Side effects and precautions discussed with him.  -Patient is encouraged to call clinic for blood glucose levels less than 70 or above 200 mg /dl.  -He does not want to continue metformin. He decided to  come off of glipizide.  - Patient specific target  A1c;  LDL, HDL, Triglycerides,  were discussed in detail.  2) BP/HTN: -His blood pressure is not controlled to target.   He is advised to continue  with his current blood pressure medications including lisinopril  40 mg p.o. daily along with his carvedilol 3.125 mg p.o. twice daily.    3) Lipids: His recent lipid panel showed improved and controlled LDL at 59.  He is  advised to continue Lipitor 80 mg p.o. nightly.  He is also on omega-3 fatty acids 1000 mg p.o. twice daily.  Whole food plant-based diet will help with dyslipidemia.      4)  Weight/Diet: His BMI is 41.09---clearly complicating his diabetes care.  He is a candidate for major weight loss.  CDE Consult has been initiated  , exercise, and detailed carbohydrates information provided.  He is an ideal candidate for bariatric surgery.    5) Chronic Care/Health Maintenance:  -he  is on ACEI/ARB and Statin medications and  is encouraged to continue to follow up with Ophthalmology, Dentist,  Podiatrist at least yearly or according to recommendations, and advised to  quit smoking. I have recommended yearly flu vaccine and pneumonia vaccination at least every 5 years; moderate intensity exercise for up to 150 minutes weekly; and  sleep for at least 7 hours a day.  - I advised patient to maintain close follow up with Lance Moris, PA-C for primary care needs.  I spent 41 minutes in the care of the patient today including review of labs from CMP, Lipids, Thyroid Function, Hematology (current and previous including abstractions from other facilities); face-to-face time discussing  his blood glucose readings/logs, discussing hypoglycemia and hyperglycemia episodes and symptoms, medications doses, his options of short and long term treatment based on the latest standards of care / guidelines;  discussion about incorporating lifestyle medicine;  and documenting the encounter. Risk reduction  counseling performed per USPSTF guidelines to reduce  obesity and cardiovascular risk factors.     Please refer to Patient Instructions for Blood Glucose Monitoring and Insulin/Medications Dosing Guide"  in media tab for additional information. Please  also refer to " Patient Self Inventory" in the Media  tab for reviewed elements of pertinent patient history.  [name] participated in the discussions, expressed understanding, and voiced agreement with the above plans.  All questions were answered to his satisfaction. he is encouraged to contact clinic should he have any questions or concerns prior to his return visit.    Follow up plan: - Return in about 4 months (around 02/27/2023) for Bring Meter/CGM Device/Logs- A1c in Office.  Marquis Lunch, MD Phone: [phone]  Fax: [phone]  -  This note was partially dictated with voice recognition software. Similar sounding words can be transcribed inadequately or may not  be corrected upon review.  10/29/2022, 7:51 PM

## 2022-10-29 NOTE — Patient Instructions (Signed)

## 2022-12-02 ENCOUNTER — Other Ambulatory Visit (HOSPITAL_COMMUNITY): Payer: Self-pay

## 2022-12-04 ENCOUNTER — Encounter: Payer: Self-pay | Admitting: Radiology

## 2022-12-05 ENCOUNTER — Other Ambulatory Visit: Payer: Self-pay | Admitting: "Endocrinology

## 2022-12-05 ENCOUNTER — Other Ambulatory Visit: Payer: Self-pay | Admitting: Internal Medicine

## 2022-12-05 ENCOUNTER — Other Ambulatory Visit: Payer: Self-pay | Admitting: Cardiology

## 2022-12-05 DIAGNOSIS — E1159 Type 2 diabetes mellitus with other circulatory complications: Secondary | ICD-10-CM

## 2022-12-10 ENCOUNTER — Other Ambulatory Visit: Payer: Self-pay

## 2022-12-10 DIAGNOSIS — E1165 Type 2 diabetes mellitus with hyperglycemia: Secondary | ICD-10-CM

## 2022-12-10 MED ORDER — BD PEN NEEDLE SHORT U/F 31G X 8 MM MISC: 2 refills | Status: DC

## 2022-12-10 MED ORDER — INSULIN GLARGINE 100 UNIT/ML SOLOSTAR PEN: 70.0000 [IU] | PEN_INJECTOR | Freq: Every day | SUBCUTANEOUS | 0 refills | Status: DC

## 2022-12-17 ENCOUNTER — Other Ambulatory Visit: Payer: Self-pay | Admitting: "Endocrinology

## 2022-12-17 DIAGNOSIS — E1159 Type 2 diabetes mellitus with other circulatory complications: Secondary | ICD-10-CM

## 2023-01-03 ENCOUNTER — Other Ambulatory Visit: Payer: Self-pay | Admitting: "Endocrinology

## 2023-01-03 DIAGNOSIS — E1165 Type 2 diabetes mellitus with hyperglycemia: Secondary | ICD-10-CM

## 2023-01-15 ENCOUNTER — Other Ambulatory Visit: Payer: Self-pay

## 2023-01-15 ENCOUNTER — Emergency Department (HOSPITAL_COMMUNITY): Payer: 59

## 2023-01-15 ENCOUNTER — Inpatient Hospital Stay (HOSPITAL_COMMUNITY)
Admission: EM | Admit: 2023-01-15 | Discharge: 2023-01-17 | DRG: 251 | Disposition: A | Discharge status: Home / Self Care | Payer: 59 | Attending: Cardiology | Admitting: Cardiology

## 2023-01-15 ENCOUNTER — Encounter (HOSPITAL_COMMUNITY): Payer: Self-pay

## 2023-01-15 DIAGNOSIS — F17210 Nicotine dependence, cigarettes, uncomplicated: Secondary | ICD-10-CM | POA: Diagnosis present

## 2023-01-15 DIAGNOSIS — Z7902 Long term (current) use of antithrombotics/antiplatelets: Secondary | ICD-10-CM

## 2023-01-15 DIAGNOSIS — Z79899 Other long term (current) drug therapy: Secondary | ICD-10-CM

## 2023-01-15 DIAGNOSIS — Z7982 Long term (current) use of aspirin: Secondary | ICD-10-CM

## 2023-01-15 DIAGNOSIS — Z28310 Unvaccinated for covid-19: Secondary | ICD-10-CM

## 2023-01-15 DIAGNOSIS — E1159 Type 2 diabetes mellitus with other circulatory complications: Secondary | ICD-10-CM

## 2023-01-15 DIAGNOSIS — I25110 Atherosclerotic heart disease of native coronary artery with unstable angina pectoris: Secondary | ICD-10-CM | POA: Diagnosis present

## 2023-01-15 DIAGNOSIS — I252 Old myocardial infarction: Secondary | ICD-10-CM

## 2023-01-15 DIAGNOSIS — Z6841 Body Mass Index (BMI) 40.0 and over, adult: Secondary | ICD-10-CM

## 2023-01-15 DIAGNOSIS — K219 Gastro-esophageal reflux disease without esophagitis: Secondary | ICD-10-CM | POA: Diagnosis present

## 2023-01-15 DIAGNOSIS — I10 Essential (primary) hypertension: Secondary | ICD-10-CM | POA: Diagnosis present

## 2023-01-15 DIAGNOSIS — F17200 Nicotine dependence, unspecified, uncomplicated: Secondary | ICD-10-CM | POA: Diagnosis present

## 2023-01-15 DIAGNOSIS — T82855A Stenosis of coronary artery stent, initial encounter: Principal | ICD-10-CM | POA: Diagnosis present

## 2023-01-15 DIAGNOSIS — E118 Type 2 diabetes mellitus with unspecified complications: Secondary | ICD-10-CM

## 2023-01-15 DIAGNOSIS — I471 Supraventricular tachycardia, unspecified: Secondary | ICD-10-CM | POA: Diagnosis present

## 2023-01-15 DIAGNOSIS — R001 Bradycardia, unspecified: Secondary | ICD-10-CM | POA: Diagnosis present

## 2023-01-15 DIAGNOSIS — I5022 Chronic systolic (congestive) heart failure: Secondary | ICD-10-CM | POA: Insufficient documentation

## 2023-01-15 DIAGNOSIS — Z720 Tobacco use: Secondary | ICD-10-CM | POA: Diagnosis present

## 2023-01-15 DIAGNOSIS — R079 Chest pain, unspecified: Secondary | ICD-10-CM | POA: Diagnosis present

## 2023-01-15 DIAGNOSIS — I110 Hypertensive heart disease with heart failure: Secondary | ICD-10-CM | POA: Diagnosis present

## 2023-01-15 DIAGNOSIS — J9811 Atelectasis: Secondary | ICD-10-CM | POA: Diagnosis present

## 2023-01-15 DIAGNOSIS — Z880 Allergy status to penicillin: Secondary | ICD-10-CM

## 2023-01-15 DIAGNOSIS — Z9861 Coronary angioplasty status: Secondary | ICD-10-CM

## 2023-01-15 DIAGNOSIS — I200 Unstable angina: Secondary | ICD-10-CM | POA: Diagnosis present

## 2023-01-15 DIAGNOSIS — E669 Obesity, unspecified: Secondary | ICD-10-CM | POA: Insufficient documentation

## 2023-01-15 DIAGNOSIS — I2510 Atherosclerotic heart disease of native coronary artery without angina pectoris: Secondary | ICD-10-CM | POA: Diagnosis present

## 2023-01-15 DIAGNOSIS — Z91030 Bee allergy status: Secondary | ICD-10-CM

## 2023-01-15 DIAGNOSIS — Z955 Presence of coronary angioplasty implant and graft: Secondary | ICD-10-CM

## 2023-01-15 DIAGNOSIS — I5032 Chronic diastolic (congestive) heart failure: Secondary | ICD-10-CM | POA: Diagnosis present

## 2023-01-15 DIAGNOSIS — Z87442 Personal history of urinary calculi: Secondary | ICD-10-CM

## 2023-01-15 DIAGNOSIS — Y831 Surgical operation with implant of artificial internal device as the cause of abnormal reaction of the patient, or of later complication, without mention of misadventure at the time of the procedure: Secondary | ICD-10-CM | POA: Diagnosis present

## 2023-01-15 DIAGNOSIS — Z8249 Family history of ischemic heart disease and other diseases of the circulatory system: Secondary | ICD-10-CM

## 2023-01-15 DIAGNOSIS — Z794 Long term (current) use of insulin: Secondary | ICD-10-CM

## 2023-01-15 DIAGNOSIS — Z833 Family history of diabetes mellitus: Secondary | ICD-10-CM

## 2023-01-15 DIAGNOSIS — Z8674 Personal history of sudden cardiac arrest: Secondary | ICD-10-CM

## 2023-01-15 DIAGNOSIS — Z7985 Long-term (current) use of injectable non-insulin antidiabetic drugs: Secondary | ICD-10-CM

## 2023-01-15 DIAGNOSIS — E782 Mixed hyperlipidemia: Secondary | ICD-10-CM | POA: Diagnosis present

## 2023-01-15 LAB — BASIC METABOLIC PANEL
Anion gap: 9 (ref 5–15)
BUN: 14 mg/dL (ref 6–20)
CO2: 25 mmol/L (ref 22–32)
Calcium: 8.6 mg/dL — ABNORMAL LOW (ref 8.9–10.3)
Chloride: 100 mmol/L (ref 98–111)
Creatinine, Ser: 1.15 mg/dL (ref 0.61–1.24)
GFR, Estimated: >60 mL/min (ref >60)
Glucose, Bld: 238 mg/dL — ABNORMAL HIGH (ref 70–99)
Potassium: 4.3 mmol/L (ref 3.5–5.1)
Sodium: 134 mmol/L — ABNORMAL LOW (ref 135–145)

## 2023-01-15 LAB — CBC
HCT: 39.9 % (ref 39.0–52.0)
Hemoglobin: 13 g/dL (ref 13.0–17.0)
MCH: 27.4 pg (ref 26.0–34.0)
MCHC: 32.6 g/dL (ref 30.0–36.0)
MCV: 84 fL (ref 80.0–100.0)
Platelets: 210 10*3/uL (ref 150–400)
RBC: 4.75 MIL/uL (ref 4.22–5.81)
RDW: 14.1 % (ref 11.5–15.5)
WBC: 11.9 10*3/uL — ABNORMAL HIGH (ref 4.0–10.5)
nRBC: 0 % (ref 0.0–0.2)

## 2023-01-15 LAB — TROPONIN I (HIGH SENSITIVITY)
Troponin I (High Sensitivity): 13 ng/L (ref <18)
Troponin I (High Sensitivity): 23 ng/L — ABNORMAL HIGH (ref <18)

## 2023-01-15 LAB — LIPASE, BLOOD: Lipase: 34 U/L (ref 11–51)

## 2023-01-15 MED ORDER — ALUM & MAG HYDROXIDE-SIMETH 200-200-20 MG/5ML PO SUSP
30.0000 mL | Freq: Once | ORAL | Status: AC
  Administered 2023-01-15: 30 mL via ORAL
  Filled 2023-01-15: qty 30

## 2023-01-15 MED ORDER — LIDOCAINE VISCOUS HCL 2 % MT SOLN
15.0000 mL | Freq: Once | OROMUCOSAL | Status: AC
  Administered 2023-01-15: 15 mL via ORAL
  Filled 2023-01-15: qty 15

## 2023-01-15 MED ORDER — ASPIRIN 81 MG PO CHEW
243.0000 mg | CHEWABLE_TABLET | Freq: Once | ORAL | Status: AC
  Administered 2023-01-15: 243 mg via ORAL
  Filled 2023-01-15: qty 3

## 2023-01-15 NOTE — ED Triage Notes (Signed)
Pt presents to ED with c/o chest/epigastric pain that started about 1-1/2 hours ago while eating chk pot pie, pt says he has been belching with some relief of pressure, pt also took 2 nitro at home with no relief.

## 2023-01-15 NOTE — ED Provider Notes (Signed)
[place] [facility] AT [facility] Provider Note   CSN: [id number] Arrival date & time: 01/15/23  1940     History  Chief Complaint  Patient presents with   Chest Pain    Epigastric pain    Lance [name] is a 47 y.o. male.  47 year old male with history of CAD status post PCI, GERD, HTN, HLD, DM, and SVT who presents emergency department with chest discomfort.  Patient said that 6:30 PM tonight started experiencing substernal chest pressure.  Says that moved down to his epigastrium.  Says that it feels like reflux but was more severe.  Did have an episode of nausea and vomiting and reported improvement of his symptoms afterwards.  Says he did have an episode of diaphoresis with that as well.  Unsure if the pain is exertional.  Says he has been compliant with his aspirin and Brilinta.  Father had MI before the age of 71.  Says that he still smokes.  See most recent heart cath report below.  Denies EtOH use.   03/2019 Conclusions: 1. Severe two-vessel CAD with 90% stenosis of proximal LAD just beyond D1 with thrombos (some of which had embolized to the apical LAD) and 99% in-stent restenosis of OM1 and 90% stenosis of jailed mid LCx. 2. Mild in-stent restenosis involving the mid RCA. 3. Moderately elevated left ventricular filling pressure with suggestion of anterior hypokinesis on the left ventriculogram (suboptimal opacification with hand-injection). 4. Successful PCI to proximal LAD using Synergy 3.0 x 2 mm DES (post-dilated proximally to 4.0 mm) with 0% residual stenosis and TIMI-3 flow. 5. Success balloon angioplasty to LCx/OM1 stent using [place] Euphora 2.25 x 8 mm and Sapphire 2.5 x 12 mm balloons with reduction in stenosis of OM1 from 99% to 40% and mid LCx from 90% to 60% and restoration of TIMI-3 flow.  Given improving chest pain, relatively small vessel size with need for complex bifurcation intervention, and radiation/contrast doses administered to that point,  decision was made to forego additional intervention to this vessel.   Recommendations: 1. Continue tirofiban infusion x 18 hours. 2. Dual antiplatelet therapy with aspirin and ticagrelor for at least 12 months, ideally longer. 3. Aggressive secondary prevention. 4. Obtain transthoracic echocardiogram to assess LV function. 5. If patient has refractory chest pain, complex bifurcation intervention to previously stented LCx/OM1 and AV groove LCx could be considered, though I worry about restenosis in the future given aggressive restenosis since prior PCI in 2018, relatively small vessel size, and likely need for culotte stenting of the LCx.        Home Medications Prior to Admission medications   Medication Sig Start Date End Date Taking? Authorizing Provider  ALPRAZolam Prudy Feeler) 1 MG tablet Take 1 mg by mouth daily as needed for anxiety.    [provider]  aspirin EC 81 MG tablet Take 1 tablet (81 mg total) by mouth daily with breakfast. 04/05/19   Mariea Clonts, Courage, MD  atorvastatin (LIPITOR) 80 MG tablet TAKE 1 TABLET BY MOUTH EVERYDAY AT BEDTIME 09/09/22   Jonelle Sidle, MD  blood glucose meter kit and supplies KIT 1 each by Does not apply route 4 (four) times daily. Dispense based on patient and insurance preference. Use up to four times daily as directed. (FOR ICD-9 250.00, 250.01). 02/14/20   Roma Kayser, MD  BRILINTA 60 MG TABS tablet TAKE 1 TABLET BY MOUTH TWICE A DAY 12/05/22   Jonelle Sidle, MD  Continuous Blood Gluc Sensor [facility]  G7 SENSOR) MISC CHANGE SENSOR EVERY 10 DAYS 12/18/22   Roma Kayser, MD  insulin aspart (NOVOLOG FLEXPEN) 100 UNIT/ML FlexPen Inject 16-22 Units into the skin 3 (three) times daily with meals. Sliding scale 10/29/22   Roma Kayser, MD  insulin glargine (LANTUS SOLOSTAR) 100 UNIT/ML Solostar Pen INJECT 70 UNITS INTO THE SKIN AT BEDTIME. 01/06/23   Nida, Denman George, MD  Insulin Pen Needle (B-D ULTRAFINE III SHORT  PEN) 31G X 8 MM MISC USE TO INJECT INSULIN 4 (FOUR) TIMES DAILY. 12/10/22   Roma Kayser, MD  lisinopril (ZESTRIL) 40 MG tablet TAKE 1 TABLET BY MOUTH EVERY DAY 12/05/22   Jonelle Sidle, MD  nitroGLYCERIN (NITROSTAT) 0.4 MG SL tablet Place 1 tablet (0.4 mg total) under the tongue every 5 (five) minutes x 3 doses as needed for chest pain. 09/02/21   Jonelle Sidle, MD  Omega-3 1000 MG CAPS Take 1,000 mg by mouth daily.    [provider]  Semaglutide,0.25 or 0.5MG /DOS, (OZEMPIC, 0.25 OR 0.5 MG/DOSE,) 2 MG/1.5ML SOPN Inject 0.25 mg into the skin once a week.    [provider]      Allergies    Bee venom and Penicillins    Review of Systems   Review of Systems  Physical Exam Updated Vital Signs BP 137/60   Pulse 73   Temp 97.9 F (36.6 C) (Oral)   Resp 17   Ht 5\' 11"  (1.803 m)   Wt 133.6 kg   SpO2 96%   BMI 41.08 kg/m  Physical Exam Vitals and nursing note reviewed.  Constitutional:      General: He is not in acute distress.    Appearance: He is well-developed.  HENT:     Head: Normocephalic and atraumatic.     Right Ear: External ear normal.     Left Ear: External ear normal.     Nose: Nose normal.  Eyes:     Extraocular Movements: Extraocular movements intact.     Conjunctiva/sclera: Conjunctivae normal.     Pupils: Pupils are equal, round, and reactive to light.  Cardiovascular:     Rate and Rhythm: Normal rate and regular rhythm.     Heart sounds: Normal heart sounds.  Pulmonary:     Effort: Pulmonary effort is normal. No respiratory distress.     Breath sounds: Normal breath sounds.  Abdominal:     General: There is no distension.     Palpations: Abdomen is soft. There is no mass.     Tenderness: There is no abdominal tenderness. There is no guarding.  Musculoskeletal:     Cervical back: Normal range of motion and neck supple.     Right lower leg: No edema.     Left lower leg: No edema.  Skin:    General: Skin is warm and dry.   Neurological:     Mental Status: He is alert. Mental status is at baseline.  Psychiatric:        Mood and Affect: Mood normal.        Behavior: Behavior normal.     ED Results / Procedures / Treatments   Labs (all labs ordered are listed, but only abnormal results are displayed) Labs Reviewed  BASIC METABOLIC PANEL - Abnormal; Notable for the following components:      Result Value   Sodium 134 (*)    Glucose, Bld 238 (*)    Calcium 8.6 (*)    All other components within normal limits  CBC - Abnormal; Notable for the following components:   WBC 11.9 (*)    All other components within normal limits  TROPONIN I (HIGH SENSITIVITY) - Abnormal; Notable for the following components:   Troponin I (High Sensitivity) 23 (*)    All other components within normal limits  LIPASE, BLOOD  HIV ANTIBODY (ROUTINE TESTING W REFLEX)  COMPREHENSIVE METABOLIC PANEL  MAGNESIUM  CBC WITH DIFFERENTIAL/PLATELET  TROPONIN I (HIGH SENSITIVITY)  TROPONIN I (HIGH SENSITIVITY)    EKG EKG Interpretation  Date/Time:  Thursday January 15 2023 20:22:29 EDT Ventricular Rate:  94 PR Interval:  181 QRS Duration: 96 QT Interval:  352 QTC Calculation: 441 R Axis:   101 Text Interpretation: Sinus rhythm Right axis deviation Baseline wander in lead(s) V5 Confirmed by Vonita Moss [phone]) on 01/15/2023 9:24:38 PM  Radiology DG Chest 2 View  Result Date: 01/15/2023 CLINICAL DATA:  Chest and epigastric pain. EXAM: CHEST - 2 VIEW COMPARISON:  03/09/2021. FINDINGS: Heart is enlarged and the mediastinal contour is within normal limits. There is mild atelectasis at the right lung base no consolidation, effusion, or pneumothorax. Mild degenerative changes are noted in the thoracic spine. Surgical clips are present in the upper abdomen. IMPRESSION: Mild atelectasis at the right lung base. Electronically Signed   By: Thornell Sartorius M.D.   On: 01/15/2023 21:18    Procedures Procedures   Medications Ordered in  ED Medications  aspirin EC tablet 81 mg (has no administration in time range)  atorvastatin (LIPITOR) tablet 80 mg (has no administration in time range)  lisinopril (ZESTRIL) tablet 40 mg (has no administration in time range)  ALPRAZolam (XANAX) tablet 1 mg (has no administration in time range)  ticagrelor (BRILINTA) tablet 60 mg (has no administration in time range)  acetaminophen (TYLENOL) tablet 650 mg (has no administration in time range)    Or  acetaminophen (TYLENOL) suppository 650 mg (has no administration in time range)  oxyCODONE (Oxy IR/ROXICODONE) immediate release tablet 5 mg (has no administration in time range)  morphine (PF) 2 MG/ML injection 2 mg (has no administration in time range)  ondansetron (ZOFRAN) tablet 4 mg (has no administration in time range)    Or  ondansetron (ZOFRAN) injection 4 mg (has no administration in time range)  heparin injection 5,000 Units (has no administration in time range)  alum & mag hydroxide-simeth (MAALOX/MYLANTA) 200-200-20 MG/5ML suspension 30 mL (30 mLs Oral Given 01/15/23 2134)    And  lidocaine (XYLOCAINE) 2 % viscous mouth solution 15 mL (15 mLs Oral Given 01/15/23 2134)  aspirin chewable tablet 243 mg (243 mg Oral Given 01/15/23 2318)    ED Course/ Medical Decision Making/ A&P Clinical Course as of 01/16/23 0057  Thu Jan 15, 2023  2333 Dr Park Breed recommends bringing in for obs and echo in the morning. Recommends repeat consult if troponin increases.  [RP]  2335 Dr Dr Dorthula Perfect from hospitalist to admit.  [RP]    Clinical Course User Index [RP] Rondel Baton, MD                             Medical Decision Making Amount and/or Complexity of Data Reviewed Labs: ordered. Radiology: ordered.  Risk OTC drugs. Prescription drug management. Decision regarding hospitalization.   JAYKE CAUL is a 47 y.o. male with comorbidities that complicate the patient evaluation including CAD status post PCI, GERD, HTN, HLD, DM, and SVT who  presents emergency department with chest discomfort.  Initial Ddx:  MI, reflux, pancreatitis  MDM:  Concern the patient may have an MI given his history and risk factors and description of his discomfort with his vomiting and diaphoresis.  Potentially also have reflux since he has a history of this and reports that it feels like his reflux.  With his epigastric pain and vomiting we will also obtain a lipase does not have any significant tenderness to palpation on exam.  Plan:  Labs Troponin EKG Chest x-ray GI cocktail Aspirin  ED Summary/Re-evaluation:  Patient underwent the above workup.  EKG did not show any new ischemic changes but his troponin did increase from 13 and 23.  Patient discussed with cardiology and especially given his recent heart catheterization that shows residual disease we opted to admit the patient to medicine for serial troponins and echo.  Patient remained stable in the emergency department.  This patient presents to the ED for concern of complaints listed in HPI, this involves an extensive number of treatment options, and is a complaint that carries with it a high risk of complications and morbidity. Disposition including potential need for admission considered.   Dispo: Admit to Floor  Additional history obtained from family Records reviewed ED Visit Notes The following labs were independently interpreted: Chemistry and show  no acute abnormalities aside from hyperglycemia without DKA I independently reviewed the following imaging with scope of interpretation limited to determining acute life threatening conditions related to emergency care: Chest x-ray and agree with the radiologist interpretation with the following exceptions: None I personally reviewed and interpreted cardiac monitoring: normal sinus rhythm  I personally reviewed and interpreted the pt's EKG: see above for interpretation  I have reviewed the patients home medications and made adjustments as  needed Consults: Cardiology and Hospitalist  Final Clinical Impression(s) / ED Diagnoses Final diagnoses:  Chest pain, unspecified type  History of MI (myocardial infarction)    Rx / DC Orders ED Discharge Orders     None         Rondel BatonPaterson, Tawnya Pujol C, MD 01/16/23 [phone]

## 2023-01-15 NOTE — ED Notes (Signed)
Pt states after throwing up earlier, pt feels better

## 2023-01-16 ENCOUNTER — Observation Stay (HOSPITAL_BASED_OUTPATIENT_CLINIC_OR_DEPARTMENT_OTHER): Payer: 59

## 2023-01-16 ENCOUNTER — Other Ambulatory Visit (HOSPITAL_COMMUNITY): Payer: Self-pay | Admitting: *Deleted

## 2023-01-16 ENCOUNTER — Other Ambulatory Visit (HOSPITAL_COMMUNITY): Payer: Self-pay

## 2023-01-16 ENCOUNTER — Encounter (HOSPITAL_COMMUNITY)
Admission: EM | Disposition: A | Discharge status: Home / Self Care | Payer: Self-pay | Source: Home / Self Care | Attending: Cardiology

## 2023-01-16 DIAGNOSIS — F17210 Nicotine dependence, cigarettes, uncomplicated: Secondary | ICD-10-CM | POA: Diagnosis not present

## 2023-01-16 DIAGNOSIS — E669 Obesity, unspecified: Secondary | ICD-10-CM

## 2023-01-16 DIAGNOSIS — Z7985 Long-term (current) use of injectable non-insulin antidiabetic drugs: Secondary | ICD-10-CM | POA: Diagnosis not present

## 2023-01-16 DIAGNOSIS — Z79899 Other long term (current) drug therapy: Secondary | ICD-10-CM | POA: Diagnosis not present

## 2023-01-16 DIAGNOSIS — I110 Hypertensive heart disease with heart failure: Secondary | ICD-10-CM | POA: Diagnosis not present

## 2023-01-16 DIAGNOSIS — R0789 Other chest pain: Secondary | ICD-10-CM

## 2023-01-16 DIAGNOSIS — Z955 Presence of coronary angioplasty implant and graft: Secondary | ICD-10-CM

## 2023-01-16 DIAGNOSIS — I2510 Atherosclerotic heart disease of native coronary artery without angina pectoris: Secondary | ICD-10-CM | POA: Diagnosis not present

## 2023-01-16 DIAGNOSIS — Z8674 Personal history of sudden cardiac arrest: Secondary | ICD-10-CM | POA: Diagnosis not present

## 2023-01-16 DIAGNOSIS — Y831 Surgical operation with implant of artificial internal device as the cause of abnormal reaction of the patient, or of later complication, without mention of misadventure at the time of the procedure: Secondary | ICD-10-CM | POA: Diagnosis not present

## 2023-01-16 DIAGNOSIS — I10 Essential (primary) hypertension: Secondary | ICD-10-CM

## 2023-01-16 DIAGNOSIS — Z6841 Body Mass Index (BMI) 40.0 and over, adult: Secondary | ICD-10-CM | POA: Diagnosis not present

## 2023-01-16 DIAGNOSIS — Z28310 Unvaccinated for covid-19: Secondary | ICD-10-CM | POA: Diagnosis not present

## 2023-01-16 DIAGNOSIS — I252 Old myocardial infarction: Secondary | ICD-10-CM | POA: Diagnosis not present

## 2023-01-16 DIAGNOSIS — Z833 Family history of diabetes mellitus: Secondary | ICD-10-CM | POA: Diagnosis not present

## 2023-01-16 DIAGNOSIS — Z7982 Long term (current) use of aspirin: Secondary | ICD-10-CM | POA: Diagnosis not present

## 2023-01-16 DIAGNOSIS — R079 Chest pain, unspecified: Secondary | ICD-10-CM

## 2023-01-16 DIAGNOSIS — Z7902 Long term (current) use of antithrombotics/antiplatelets: Secondary | ICD-10-CM | POA: Diagnosis not present

## 2023-01-16 DIAGNOSIS — Z794 Long term (current) use of insulin: Secondary | ICD-10-CM | POA: Diagnosis not present

## 2023-01-16 DIAGNOSIS — E1159 Type 2 diabetes mellitus with other circulatory complications: Secondary | ICD-10-CM

## 2023-01-16 DIAGNOSIS — I471 Supraventricular tachycardia, unspecified: Secondary | ICD-10-CM | POA: Diagnosis not present

## 2023-01-16 DIAGNOSIS — I200 Unstable angina: Secondary | ICD-10-CM | POA: Diagnosis present

## 2023-01-16 DIAGNOSIS — I25118 Atherosclerotic heart disease of native coronary artery with other forms of angina pectoris: Secondary | ICD-10-CM | POA: Diagnosis not present

## 2023-01-16 DIAGNOSIS — Z87442 Personal history of urinary calculi: Secondary | ICD-10-CM | POA: Diagnosis not present

## 2023-01-16 DIAGNOSIS — J9811 Atelectasis: Secondary | ICD-10-CM | POA: Diagnosis not present

## 2023-01-16 DIAGNOSIS — Z880 Allergy status to penicillin: Secondary | ICD-10-CM | POA: Diagnosis not present

## 2023-01-16 DIAGNOSIS — Z8249 Family history of ischemic heart disease and other diseases of the circulatory system: Secondary | ICD-10-CM | POA: Diagnosis not present

## 2023-01-16 DIAGNOSIS — Z720 Tobacco use: Secondary | ICD-10-CM

## 2023-01-16 DIAGNOSIS — I25110 Atherosclerotic heart disease of native coronary artery with unstable angina pectoris: Secondary | ICD-10-CM | POA: Diagnosis not present

## 2023-01-16 DIAGNOSIS — T82855A Stenosis of coronary artery stent, initial encounter: Secondary | ICD-10-CM | POA: Diagnosis not present

## 2023-01-16 DIAGNOSIS — I5022 Chronic systolic (congestive) heart failure: Secondary | ICD-10-CM | POA: Diagnosis not present

## 2023-01-16 DIAGNOSIS — K219 Gastro-esophageal reflux disease without esophagitis: Secondary | ICD-10-CM | POA: Diagnosis not present

## 2023-01-16 DIAGNOSIS — I5032 Chronic diastolic (congestive) heart failure: Secondary | ICD-10-CM | POA: Diagnosis not present

## 2023-01-16 DIAGNOSIS — E782 Mixed hyperlipidemia: Secondary | ICD-10-CM | POA: Diagnosis not present

## 2023-01-16 HISTORY — PX: CORONARY BALLOON ANGIOPLASTY: CATH118233

## 2023-01-16 HISTORY — PX: LEFT HEART CATH AND CORONARY ANGIOGRAPHY: CATH118249

## 2023-01-16 LAB — HEMOGLOBIN A1C
Hgb A1c MFr Bld: 7.1 % — ABNORMAL HIGH (ref 4.8–5.6)
Mean Plasma Glucose: 157.07 mg/dL

## 2023-01-16 LAB — CBC
HCT: 40.1 % (ref 39.0–52.0)
Hemoglobin: 13.2 g/dL (ref 13.0–17.0)
MCH: 27.2 pg (ref 26.0–34.0)
MCHC: 32.9 g/dL (ref 30.0–36.0)
MCV: 82.5 fL (ref 80.0–100.0)
Platelets: 221 10*3/uL (ref 150–400)
RBC: 4.86 MIL/uL (ref 4.22–5.81)
RDW: 14.1 % (ref 11.5–15.5)
WBC: 12.6 10*3/uL — ABNORMAL HIGH (ref 4.0–10.5)
nRBC: 0 % (ref 0.0–0.2)

## 2023-01-16 LAB — COMPREHENSIVE METABOLIC PANEL
ALT: 27 U/L (ref 0–44)
AST: 20 U/L (ref 15–41)
Albumin: 3.5 g/dL (ref 3.5–5.0)
Alkaline Phosphatase: 85 U/L (ref 38–126)
Anion gap: 7 (ref 5–15)
BUN: 14 mg/dL (ref 6–20)
CO2: 27 mmol/L (ref 22–32)
Calcium: 8.8 mg/dL — ABNORMAL LOW (ref 8.9–10.3)
Chloride: 101 mmol/L (ref 98–111)
Creatinine, Ser: 1.01 mg/dL (ref 0.61–1.24)
GFR, Estimated: >60 mL/min (ref >60)
Glucose, Bld: 127 mg/dL — ABNORMAL HIGH (ref 70–99)
Potassium: 4.1 mmol/L (ref 3.5–5.1)
Sodium: 135 mmol/L (ref 135–145)
Total Bilirubin: 0.9 mg/dL (ref 0.3–1.2)
Total Protein: 6.6 g/dL (ref 6.5–8.1)

## 2023-01-16 LAB — CBC WITH DIFFERENTIAL/PLATELET
Abs Immature Granulocytes: 0.03 10*3/uL (ref 0.00–0.07)
Basophils Absolute: 0.1 10*3/uL (ref 0.0–0.1)
Basophils Relative: 1 %
Eosinophils Absolute: 0.3 10*3/uL (ref 0.0–0.5)
Eosinophils Relative: 3 %
HCT: 41.3 % (ref 39.0–52.0)
Hemoglobin: 13.3 g/dL (ref 13.0–17.0)
Immature Granulocytes: 0 %
Lymphocytes Relative: 31 %
Lymphs Abs: 3.6 10*3/uL (ref 0.7–4.0)
MCH: 27.3 pg (ref 26.0–34.0)
MCHC: 32.2 g/dL (ref 30.0–36.0)
MCV: 84.6 fL (ref 80.0–100.0)
Monocytes Absolute: 0.8 10*3/uL (ref 0.1–1.0)
Monocytes Relative: 7 %
Neutro Abs: 6.7 10*3/uL (ref 1.7–7.7)
Neutrophils Relative %: 58 %
Platelets: 218 10*3/uL (ref 150–400)
RBC: 4.88 MIL/uL (ref 4.22–5.81)
RDW: 14.1 % (ref 11.5–15.5)
WBC: 11.5 10*3/uL — ABNORMAL HIGH (ref 4.0–10.5)
nRBC: 0 % (ref 0.0–0.2)

## 2023-01-16 LAB — TROPONIN I (HIGH SENSITIVITY)
Troponin I (High Sensitivity): 33 ng/L — ABNORMAL HIGH (ref <18)
Troponin I (High Sensitivity): 40 ng/L — ABNORMAL HIGH (ref <18)
Troponin I (High Sensitivity): 39 ng/L — ABNORMAL HIGH (ref <18)

## 2023-01-16 LAB — CBG MONITORING, ED: Glucose-Capillary: 134 mg/dL — ABNORMAL HIGH (ref 70–99)

## 2023-01-16 LAB — ECHOCARDIOGRAM COMPLETE
AR max vel: 1.67 cm2
AV Area VTI: 1.6 cm2
AV Area mean vel: 1.81 cm2
AV Mean grad: 9 mmHg
AV Peak grad: 17.8 mmHg
Ao pk vel: 2.11 m/s
Area-P 1/2: 1.83 cm2
Height: 71 in
MV VTI: 1.22 cm2
S' Lateral: 3.1 cm
Weight: 4712.55 oz

## 2023-01-16 LAB — POCT ACTIVATED CLOTTING TIME
Activated Clotting Time: 396 seconds
Activated Clotting Time: 672 seconds

## 2023-01-16 LAB — CREATININE, SERUM
Creatinine, Ser: 1 mg/dL (ref 0.61–1.24)
GFR, Estimated: >60 mL/min (ref >60)

## 2023-01-16 LAB — MAGNESIUM: Magnesium: 2.2 mg/dL (ref 1.7–2.4)

## 2023-01-16 LAB — GLUCOSE, CAPILLARY
Glucose-Capillary: 246 mg/dL — ABNORMAL HIGH (ref 70–99)
Glucose-Capillary: 155 mg/dL — ABNORMAL HIGH (ref 70–99)

## 2023-01-16 LAB — HIV ANTIBODY (ROUTINE TESTING W REFLEX): HIV Screen 4th Generation wRfx: NONREACTIVE

## 2023-01-16 SURGERY — LEFT HEART CATH AND CORONARY ANGIOGRAPHY
Anesthesia: LOCAL

## 2023-01-16 MED ORDER — DIAZEPAM 5 MG PO TABS: 5.0000 mg | ORAL_TABLET | Freq: Four times a day (QID) | ORAL | Status: DC | PRN

## 2023-01-16 MED ORDER — ASPIRIN 81 MG PO TBEC
81.0000 mg | DELAYED_RELEASE_TABLET | Freq: Every day | ORAL | Status: DC
  Administered 2023-01-16 – 2023-01-17 (×2): 81 mg via ORAL
  Filled 2023-01-16 (×2): qty 1

## 2023-01-16 MED ORDER — HEPARIN (PORCINE) 25000 UT/250ML-% IV SOLN
1600.0000 [IU]/h | INTRAVENOUS | Status: DC
  Administered 2023-01-16: 1600 [IU]/h via INTRAVENOUS
  Filled 2023-01-16: qty 250

## 2023-01-16 MED ORDER — MIDAZOLAM HCL 2 MG/2ML IJ SOLN
INTRAMUSCULAR | Status: DC | PRN
  Administered 2023-01-16: 2 mg via INTRAVENOUS

## 2023-01-16 MED ORDER — SODIUM CHLORIDE 0.9 % IV SOLN: INTRAVENOUS | Status: AC

## 2023-01-16 MED ORDER — LABETALOL HCL 5 MG/ML IV SOLN: 10.0000 mg | INTRAVENOUS | Status: AC | PRN

## 2023-01-16 MED ORDER — TICAGRELOR 90 MG PO TABS
90.0000 mg | ORAL_TABLET | Freq: Two times a day (BID) | ORAL | Status: DC
  Administered 2023-01-16 – 2023-01-17 (×2): 90 mg via ORAL
  Filled 2023-01-16 (×2): qty 1

## 2023-01-16 MED ORDER — ONDANSETRON HCL 4 MG PO TABS: 4.0000 mg | ORAL_TABLET | Freq: Four times a day (QID) | ORAL | Status: DC | PRN

## 2023-01-16 MED ORDER — HEPARIN SODIUM (PORCINE) 1000 UNIT/ML IJ SOLN
INTRAMUSCULAR | Status: AC
  Filled 2023-01-16: qty 10

## 2023-01-16 MED ORDER — ATORVASTATIN CALCIUM 80 MG PO TABS
80.0000 mg | ORAL_TABLET | Freq: Every day | ORAL | Status: DC
  Administered 2023-01-16 – 2023-01-17 (×2): 80 mg via ORAL
  Filled 2023-01-16: qty 1
  Filled 2023-01-16: qty 2

## 2023-01-16 MED ORDER — HEPARIN SODIUM (PORCINE) 5000 UNIT/ML IJ SOLN: 5000.0000 [IU] | Freq: Three times a day (TID) | INTRAMUSCULAR | Status: DC

## 2023-01-16 MED ORDER — HEPARIN SODIUM (PORCINE) 5000 UNIT/ML IJ SOLN
5000.0000 [IU] | Freq: Three times a day (TID) | INTRAMUSCULAR | Status: DC
  Filled 2023-01-16: qty 1

## 2023-01-16 MED ORDER — SODIUM CHLORIDE 0.9% FLUSH: 3.0000 mL | INTRAVENOUS | Status: DC | PRN

## 2023-01-16 MED ORDER — ONDANSETRON HCL 4 MG/2ML IJ SOLN: 4.0000 mg | Freq: Four times a day (QID) | INTRAMUSCULAR | Status: DC | PRN

## 2023-01-16 MED ORDER — VERAPAMIL HCL 2.5 MG/ML IV SOLN
INTRAVENOUS | Status: DC | PRN
  Administered 2023-01-16: 10 mL via INTRA_ARTERIAL

## 2023-01-16 MED ORDER — INSULIN ASPART 100 UNIT/ML IJ SOLN: 0.0000 [IU] | Freq: Every day | INTRAMUSCULAR | Status: DC

## 2023-01-16 MED ORDER — IOHEXOL 350 MG/ML SOLN
INTRAVENOUS | Status: DC | PRN
  Administered 2023-01-16: 135 mL

## 2023-01-16 MED ORDER — MORPHINE SULFATE (PF) 2 MG/ML IV SOLN: 2.0000 mg | INTRAVENOUS | Status: DC | PRN

## 2023-01-16 MED ORDER — ACETAMINOPHEN 325 MG PO TABS: 650.0000 mg | ORAL_TABLET | ORAL | Status: DC | PRN

## 2023-01-16 MED ORDER — OXYCODONE HCL 5 MG PO TABS: 5.0000 mg | ORAL_TABLET | ORAL | Status: DC | PRN

## 2023-01-16 MED ORDER — ACETAMINOPHEN 650 MG RE SUPP: 650.0000 mg | Freq: Four times a day (QID) | RECTAL | Status: DC | PRN

## 2023-01-16 MED ORDER — LIDOCAINE HCL (PF) 1 % IJ SOLN
INTRAMUSCULAR | Status: AC
  Filled 2023-01-16: qty 30

## 2023-01-16 MED ORDER — SODIUM CHLORIDE 0.9% FLUSH
3.0000 mL | Freq: Two times a day (BID) | INTRAVENOUS | Status: DC
  Administered 2023-01-16: 3 mL via INTRAVENOUS

## 2023-01-16 MED ORDER — SODIUM CHLORIDE 0.9 % WEIGHT BASED INFUSION
1.0000 mL/kg/h | INTRAVENOUS | Status: DC
  Administered 2023-01-16: 1 mL/kg/h via INTRAVENOUS

## 2023-01-16 MED ORDER — HEPARIN (PORCINE) IN NACL 1000-0.9 UT/500ML-% IV SOLN
INTRAVENOUS | Status: DC | PRN
  Administered 2023-01-16 (×2): 500 mL

## 2023-01-16 MED ORDER — ENOXAPARIN SODIUM 40 MG/0.4ML IJ SOSY: 40.0000 mg | PREFILLED_SYRINGE | INTRAMUSCULAR | Status: DC

## 2023-01-16 MED ORDER — MIDAZOLAM HCL 2 MG/2ML IJ SOLN
INTRAMUSCULAR | Status: AC
  Filled 2023-01-16: qty 2

## 2023-01-16 MED ORDER — SODIUM CHLORIDE 0.9 % WEIGHT BASED INFUSION
3.0000 mL/kg/h | INTRAVENOUS | Status: DC
  Administered 2023-01-16: 3 mL/kg/h via INTRAVENOUS

## 2023-01-16 MED ORDER — PERFLUTREN LIPID MICROSPHERE
1.0000 mL | INTRAVENOUS | Status: AC | PRN
  Administered 2023-01-16: 3 mL via INTRAVENOUS

## 2023-01-16 MED ORDER — VERAPAMIL HCL 2.5 MG/ML IV SOLN
INTRAVENOUS | Status: AC
  Filled 2023-01-16: qty 2

## 2023-01-16 MED ORDER — LIDOCAINE HCL (PF) 1 % IJ SOLN
INTRAMUSCULAR | Status: DC | PRN
  Administered 2023-01-16: 2 mL

## 2023-01-16 MED ORDER — TICAGRELOR 90 MG PO TABS
ORAL_TABLET | ORAL | Status: DC | PRN
  Administered 2023-01-16: 180 mg via ORAL

## 2023-01-16 MED ORDER — SODIUM CHLORIDE 0.9% FLUSH
3.0000 mL | Freq: Two times a day (BID) | INTRAVENOUS | Status: DC
  Administered 2023-01-16 – 2023-01-17 (×2): 3 mL via INTRAVENOUS

## 2023-01-16 MED ORDER — HEPARIN BOLUS VIA INFUSION
4000.0000 [IU] | Freq: Once | INTRAVENOUS | Status: AC
  Administered 2023-01-16: 4000 [IU] via INTRAVENOUS

## 2023-01-16 MED ORDER — HYDRALAZINE HCL 20 MG/ML IJ SOLN: 10.0000 mg | INTRAMUSCULAR | Status: AC | PRN

## 2023-01-16 MED ORDER — ALPRAZOLAM 0.5 MG PO TABS: 1.0000 mg | ORAL_TABLET | Freq: Every day | ORAL | Status: DC | PRN

## 2023-01-16 MED ORDER — FENTANYL CITRATE (PF) 100 MCG/2ML IJ SOLN
INTRAMUSCULAR | Status: AC
  Filled 2023-01-16: qty 2

## 2023-01-16 MED ORDER — NITROGLYCERIN 1 MG/10 ML FOR IR/CATH LAB
INTRA_ARTERIAL | Status: AC
  Filled 2023-01-16: qty 10

## 2023-01-16 MED ORDER — ACETAMINOPHEN 325 MG PO TABS: 650.0000 mg | ORAL_TABLET | Freq: Four times a day (QID) | ORAL | Status: DC | PRN

## 2023-01-16 MED ORDER — SODIUM CHLORIDE 0.9 % IV SOLN: 250.0000 mL | INTRAVENOUS | Status: DC | PRN

## 2023-01-16 MED ORDER — FENTANYL CITRATE (PF) 100 MCG/2ML IJ SOLN
INTRAMUSCULAR | Status: DC | PRN
  Administered 2023-01-16: 50 ug via INTRAVENOUS

## 2023-01-16 MED ORDER — INSULIN DETEMIR 100 UNIT/ML ~~LOC~~ SOLN
55.0000 [IU] | Freq: Every day | SUBCUTANEOUS | Status: DC
  Administered 2023-01-16: 55 [IU] via SUBCUTANEOUS
  Filled 2023-01-16 (×3): qty 0.55

## 2023-01-16 MED ORDER — HEPARIN SODIUM (PORCINE) 1000 UNIT/ML IJ SOLN
INTRAMUSCULAR | Status: DC | PRN
  Administered 2023-01-16 (×2): 6500 [IU] via INTRAVENOUS

## 2023-01-16 MED ORDER — TICAGRELOR 60 MG PO TABS
60.0000 mg | ORAL_TABLET | Freq: Two times a day (BID) | ORAL | Status: DC
  Administered 2023-01-16: 60 mg via ORAL
  Filled 2023-01-16 (×8): qty 1

## 2023-01-16 MED ORDER — NITROGLYCERIN 1 MG/10 ML FOR IR/CATH LAB
INTRA_ARTERIAL | Status: DC | PRN
  Administered 2023-01-16 (×2): 200 ug via INTRACORONARY

## 2023-01-16 MED ORDER — INSULIN ASPART 100 UNIT/ML IJ SOLN
0.0000 [IU] | Freq: Three times a day (TID) | INTRAMUSCULAR | Status: DC
  Administered 2023-01-16: 5 [IU] via SUBCUTANEOUS
  Administered 2023-01-16: 2 [IU] via SUBCUTANEOUS
  Administered 2023-01-17: 8 [IU] via SUBCUTANEOUS
  Filled 2023-01-16: qty 1

## 2023-01-16 MED ORDER — ASPIRIN 81 MG PO CHEW: 81.0000 mg | CHEWABLE_TABLET | Freq: Every day | ORAL | Status: DC

## 2023-01-16 MED ORDER — LISINOPRIL 20 MG PO TABS
40.0000 mg | ORAL_TABLET | Freq: Every day | ORAL | Status: DC
  Administered 2023-01-16 – 2023-01-17 (×2): 40 mg via ORAL
  Filled 2023-01-16: qty 4
  Filled 2023-01-16: qty 2

## 2023-01-16 SURGICAL SUPPLY — 19 items
BALL SAPPHIRE NC24 3.75X15 (BALLOONS) ×1
BALLN WOLVERINE 3.00X15 (BALLOONS) ×1
BALLOON SAPPHIRE NC24 3.75X15 (BALLOONS) IMPLANT
BALLOON WOLVERINE 3.00X15 (BALLOONS) IMPLANT
CATH INFINITI JR4 5F (CATHETERS) IMPLANT
CATH OPTITORQUE TIG 4.0 5F (CATHETERS) IMPLANT
CATH VISTA GUIDE 6FR XBLAD3.5 (CATHETERS) IMPLANT
DEVICE RAD COMP TR BAND LRG (VASCULAR PRODUCTS) IMPLANT
GLIDESHEATH SLEND SS 6F .021 (SHEATH) IMPLANT
GUIDEWIRE INQWIRE 1.5J.035X260 (WIRE) IMPLANT
INQWIRE 1.5J .035X260CM (WIRE) ×1
KIT ENCORE 26 ADVANTAGE (KITS) IMPLANT
KIT HEART LEFT (KITS) ×1 IMPLANT
PACK CARDIAC CATHETERIZATION (CUSTOM PROCEDURE TRAY) ×1 IMPLANT
SHEATH PROBE COVER 6X72 (BAG) IMPLANT
SYR MEDRAD MARK 7 150ML (SYRINGE) ×1 IMPLANT
TRANSDUCER W/STOPCOCK (MISCELLANEOUS) ×1 IMPLANT
TUBING CIL FLEX 10 FLL-RA (TUBING) ×1 IMPLANT
WIRE COUGAR XT STRL 190CM (WIRE) IMPLANT

## 2023-01-16 NOTE — Assessment & Plan Note (Signed)
-   BMI 41 - Discussed importance of modifying risk factors that we can control including obesity and smoking - Patient reports he is already been counseled on how to diet

## 2023-01-16 NOTE — Progress Notes (Signed)
Pt arriving via [facility]. Reports that he has taken daily ASA but has not had schedule dose brilinta. Heparin at 1600units/hr and ns at 133.6. Denies any chest pain, groin prepped awaiting transport to Cath lab

## 2023-01-16 NOTE — Assessment & Plan Note (Signed)
-   See plan for CAD

## 2023-01-16 NOTE — Assessment & Plan Note (Signed)
-   70 units basal insulin at baseline - Continue 55 units reduced basal insulin here - Sliding scale coverage - Hemoglobin A1c pending - Continue to monitor

## 2023-01-16 NOTE — H&P (Signed)
History and Physical    Patient: Lance [name]:[id number] DOB: 1976-08-18 DOA: 01/15/2023 DOS: the patient was seen and examined on 01/16/2023 PCP: Lianne Moris, PA-C  Patient coming from: Home  Chief Complaint:  Chief Complaint  Patient presents with   Chest Pain    Epigastric pain   HPI: [name] is a 47 y.o. male with medical history significant of coronary artery disease, essential hypertension, GERD, type 2 diabetes mellitus, SVT, hyperlipidemia, and more presents ED with a chief complaint of chest pain.  It started at 6:30 PM on 01/15/2023.  It felt like a pressure.  It lasted about 90 minutes.  He tried nitro at home and it did not help.  He had associated nausea, diaphoresis, dyspnea.  The dyspnea and chest pain were no worse with exertion.  He was eating when all this started.  He denies palpitations and dizziness.  He is not sure if it feels like his previous heart attack or not.  Patient has no other complaints at this time.  Patient does smoke a pack per day.  He declines nicotine patch at this time.  He drinks occasionally.  He does not use illicit drugs.  He is not vaccinated for COVID or flu.  Patient is full code. Review of Systems: As mentioned in the history of present illness. All other systems reviewed and are negative. Past Medical History:  Diagnosis Date   CAD (coronary artery disease), native coronary artery    10/18 PCI/DES to mRCA, and OM, with total occlusion of dLCx with collaterals.    Essential hypertension    GERD (gastroesophageal reflux disease)    History of kidney stones    Hyperlipemia    SVT (supraventricular tachycardia) 2015   Converted with adenosine   Type 2 diabetes mellitus    Past Surgical History:  Procedure Laterality Date   CARDIAC SURGERY     CORONARY ANGIOPLASTY WITH STENT PLACEMENT  07/15/2017   "2 stents"   CORONARY/GRAFT ACUTE MI REVASCULARIZATION N/A 03/12/2019   Procedure: Coronary/Graft Acute MI Revascularization;   Surgeon: Yvonne Kendall, MD;  Location: [facility] INVASIVE CV LAB;  Service: Cardiovascular;  Laterality: N/A;   LAPAROSCOPIC APPENDECTOMY N/A 11/12/2017   Procedure: APPENDECTOMY LAPAROSCOPIC, REPAIR OF INCARCERATED INCISIONAL HERNIA;  Surgeon: Karie Soda, MD;  Location: [facility] ORS;  Service: General;  Laterality: N/A;   LAPAROSCOPIC CHOLECYSTECTOMY     LEFT HEART CATH AND CORONARY ANGIOGRAPHY N/A 07/15/2017   Procedure: LEFT HEART CATH AND CORONARY ANGIOGRAPHY;  Surgeon: Tonny Bollman, MD;  Location: [facility] INVASIVE CV LAB;  Service: Cardiovascular;  Laterality: N/A;   LEFT HEART CATH AND CORONARY ANGIOGRAPHY N/A 03/12/2019   Procedure: LEFT HEART CATH AND CORONARY ANGIOGRAPHY;  Surgeon: Yvonne Kendall, MD;  Location: [facility] INVASIVE CV LAB;  Service: Cardiovascular;  Laterality: N/A;   SVT ABLATION N/A 04/22/2021   Procedure: SVT ABLATION;  Surgeon: Marinus Maw, MD;  Location: [facility] INVASIVE CV LAB;  Service: Cardiovascular;  Laterality: N/A;   Social History:  reports that he has been smoking cigarettes. He has a 13.00 pack-year smoking history. He has never used smokeless tobacco. He reports that he does not drink alcohol and does not use drugs.  Allergies  Allergen Reactions   Bee Venom Anaphylaxis and Swelling   Penicillins Hives, Itching and Rash    Has patient had a PCN reaction causing immediate rash, facial/tongue/throat swelling, SOB or lightheadedness with hypotension: No  Has patient had a PCN reaction causing severe rash involving mucus membranes or  skin necrosis: No  Has patient had a PCN reaction that required hospitalization: No  Has patient had a PCN reaction occurring within the last 10 years: No  If all of the above answers are "NO", then may proceed with Cephalosporin use.  Has patient had a PCN reaction causing immediate rash, facial/tongue/throat swelling, SOB or lightheadedness with hypotension: Yes, Has patient had a PCN reaction causing severe rash involving mucus membranes or  skin necrosis: No, Has patient had a PCN reaction that required hospitalization: No, Has patient had a PCN reaction occurring within the last 10 years: No, If all of the above answers are "NO", then may proceed with Cephalosporin use.    Family History  Problem Relation Age of Onset   Heart disease Mother 23       CABG   Diabetes Mother    Gout Mother    Heart attack Father 83    Prior to Admission medications   Medication Sig Start Date End Date Taking? Authorizing Provider  ALPRAZolam Prudy Feeler) 1 MG tablet Take 1 mg by mouth daily as needed for anxiety.    [provider]  aspirin EC 81 MG tablet Take 1 tablet (81 mg total) by mouth daily with breakfast. 04/05/19   Mariea Clonts, Courage, MD  atorvastatin (LIPITOR) 80 MG tablet TAKE 1 TABLET BY MOUTH EVERYDAY AT BEDTIME 09/09/22   Jonelle Sidle, MD  blood glucose meter kit and supplies KIT 1 each by Does not apply route 4 (four) times daily. Dispense based on patient and insurance preference. Use up to four times daily as directed. (FOR ICD-9 250.00, 250.01). 02/14/20   Roma Kayser, MD  BRILINTA 60 MG TABS tablet TAKE 1 TABLET BY MOUTH TWICE A DAY 12/05/22   Jonelle Sidle, MD  Continuous Blood Gluc Sensor (DEXCOM G7 SENSOR) MISC CHANGE SENSOR EVERY 10 DAYS 12/18/22   Roma Kayser, MD  insulin aspart (NOVOLOG FLEXPEN) 100 UNIT/ML FlexPen Inject 16-22 Units into the skin 3 (three) times daily with meals. Sliding scale 10/29/22   Roma Kayser, MD  insulin glargine (LANTUS SOLOSTAR) 100 UNIT/ML Solostar Pen INJECT 70 UNITS INTO THE SKIN AT BEDTIME. 01/06/23   Nida, Denman George, MD  Insulin Pen Needle (B-D ULTRAFINE III SHORT PEN) 31G X 8 MM MISC USE TO INJECT INSULIN 4 (FOUR) TIMES DAILY. 12/10/22   Roma Kayser, MD  lisinopril (ZESTRIL) 40 MG tablet TAKE 1 TABLET BY MOUTH EVERY DAY 12/05/22   Jonelle Sidle, MD  nitroGLYCERIN (NITROSTAT) 0.4 MG SL tablet Place 1 tablet (0.4 mg total) under the tongue  every 5 (five) minutes x 3 doses as needed for chest pain. 09/02/21   Jonelle Sidle, MD  Omega-3 1000 MG CAPS Take 1,000 mg by mouth daily.    [provider]  Semaglutide,0.25 or 0.5MG /DOS, (OZEMPIC, 0.25 OR 0.5 MG/DOSE,) 2 MG/1.5ML SOPN Inject 0.25 mg into the skin once a week.    [provider]    Physical Exam: Vitals:   01/16/23 0430 01/16/23 0500 01/16/23 0504 01/16/23 0530  BP: (!) 121/53 124/74  121/76  Pulse: 67 (!) 57  62  Resp: 16 15  13   Temp:   97.8 F (36.6 C)   TempSrc:   Oral   SpO2: 98% 97%  98%  Weight:      Height:       1.  General: Patient lying supine in bed,  no acute distress   2. Psychiatric: Alert and oriented x 3,  mood and behavior normal for situation, pleasant and cooperative with exam   3. Neurologic: Speech and language are normal, face is symmetric, moves all 4 extremities voluntarily, at baseline without acute deficits on limited exam   4. HEENMT:  Head is atraumatic, normocephalic, pupils reactive to light, neck is supple, trachea is midline, mucous membranes are moist   5. Respiratory : Lungs are clear to auscultation bilaterally without wheezing, rhonchi, rales, no cyanosis, no increase in work of breathing or accessory muscle use   6. Cardiovascular : Heart rate normal, rhythm is regular, no murmurs, rubs or gallops, no peripheral edema, peripheral pulses palpated   7. Gastrointestinal:  Abdomen is soft, nondistended, nontender to palpation bowel sounds active, no masses or organomegaly palpated   8. Skin:  Skin is warm, dry and intact without rashes, acute lesions, or ulcers on limited exam   9.Musculoskeletal:  No acute deformities or trauma, no asymmetry in tone, no peripheral edema, peripheral pulses palpated, no tenderness to palpation in the extremities  Data Reviewed: In the ED Temp 97.9, heart rate 65-95, respiratory rate 12-21, blood pressure 116/53-142/78, satting 94-100% Leukocytosis at 11.9,  hemoglobin 13.0, platelets 210 Chemistry reveals a hyperglycemia at 238 Troponin uptrending 13, 23, 33, 40 Chest x-ray shows mild atelectasis at right lung base EKG shows a heart rate of 94, sinus rhythm, QTc 441 without any acute ST changes Aspirin 324 mg given in the ED GI cocktail given in the ED Consult was called to cards and they recommended echo in the a.m. Patient's last heart cath was 2020 and showed severe disease Admission requested for further workup and management of chest pain. Assessment and Plan: Obesity (BMI 30-39.9) - BMI 41 - Discussed importance of modifying risk factors that we can control including obesity and smoking - Patient reports he is already been counseled on how to diet  Chest pain - See plan for CAD  DM type 2 causing vascular disease - 70 units basal insulin at baseline - Continue 55 units reduced basal insulin here - Sliding scale coverage - Hemoglobin A1c pending - Continue to monitor  S/P drug eluting coronary stent placement - Continue Brilinta - Plavix is no longer on home medication list  CAD (coronary artery disease) - Continue aspirin, statin, ACE - Presents with chest pain today - EKG is without acute ST changes - Cardiology consulted and recommended trending tropes and getting an echo in the a.m. - Echo ordered - Troponins slowly uptrending from 13, 23, 33, 40 - Inpatient consult to cardiology - Monitor on telemetry  Tobacco abuse - Counseled on importance of cessation - Declines nicotine patch at this time  Essential hypertension, benign - Continue ACE inhibitor      Advance Care Planning:   Code Status: Full Code  Consults: Cardiology  Family Communication: Significant other at bedside  Severity of Illness: The appropriate patient status for this patient is OBSERVATION. Observation status is judged to be reasonable and necessary in order to provide the required intensity of service to ensure the patient's safety.  The patient's presenting symptoms, physical exam findings, and initial radiographic and laboratory data in the context of their medical condition is felt to place them at decreased risk for further clinical deterioration. Furthermore, it is anticipated that the patient will be medically stable for discharge from the hospital within 2 midnights of admission.   Author: Lilyan Gilford, DO 01/16/2023 6:09 AM  For on call review [email or web address].

## 2023-01-16 NOTE — Assessment & Plan Note (Signed)
-   Continue aspirin, statin, ACE - Presents with chest pain today - EKG is without acute ST changes - Cardiology consulted and recommended trending tropes and getting an echo in the a.m. - Echo ordered - Troponins slowly uptrending from 13, 23, 33, 40 - Inpatient consult to cardiology - Monitor on telemetry

## 2023-01-16 NOTE — ED Notes (Signed)
[facility] at bedside to transport pt to [facility] Cardiac Cath Lab.

## 2023-01-16 NOTE — Interval H&P Note (Signed)
Cath Lab Visit (complete for each Cath Lab visit)  Clinical Evaluation Leading to the Procedure:   ACS: No.  Non-ACS:    Anginal Classification: CCS II  Anti-ischemic medical therapy: Minimal Therapy (1 class of medications)  Non-Invasive Test Results: No non-invasive testing performed  Prior CABG: No previous CABG      History and Physical Interval Note:  01/16/2023 11:14 AM  Lance [name]  has presented today for surgery, with the diagnosis of unstable angina.  The various methods of treatment have been discussed with the patient and family. After consideration of risks, benefits and other options for treatment, the patient has consented to  Procedure(s): LEFT HEART CATH AND CORONARY ANGIOGRAPHY (N/A) as a surgical intervention.  The patient's history has been reviewed, patient examined, no change in status, stable for surgery.  I have reviewed the patient's chart and labs.  Questions were answered to the patient's satisfaction.     Nicki Guadalajara

## 2023-01-16 NOTE — Assessment & Plan Note (Signed)
Continue ACE inhibitor 

## 2023-01-16 NOTE — Consult Note (Addendum)
 Cardiology Consultation   Patient ID: Lance [name] MRN: [id number]; DOB: 03/14/1976  Admit date: 01/15/2023 Date of Consult: 01/16/2023  PCP:  Carroll, Erin, PA-C   [facility] Providers Cardiologist:  Samuel McDowell, MD  EP: Dr. Taylor  Patient Profile:   Lance [name] is a 47 y.o. male with a hx of CAD (s/p DES to RCA and OM in 07/2017, STEMI in 03/2019 complicated by VF arrest and received DES to LAD and staged PTCA to restenosed OM), HFimpEF (EF 40-45% in 03/2019 and improved to 65-70% by repeat imaging in 07/2019), SVT (s/p ablation in 04/2021), HTN, HLD, tobacco use and Type 2 DM who is being seen 01/16/2023 for the evaluation of chest pain at the request of Dr. Zierle-Ghosh.  History of Present Illness:   Lance [name] was last examined by Dr. McDowell in 07/2022 and denied any recent anginal symptoms. He did report occasional palpitations but they were very brief and spontaneously resolved within a few seconds. It was recommended to continue long-term DAPT with ASA and Brilinta and he was also continued on Lisinopril and Lipitor.  He presented to [facility] ED on 01/15/2023 for evaluation of chest/epigastric pain which started approximately 1 hour prior to arrival. 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 Denies any specific exertional chest pain or dyspnea on exertion. Last night, he developed a sternal discomfort while consuming chicken pot pie. Reports he had the pain and then had 1 episode of vomiting. He has known acid reflux but reports symptoms feel different from acid reflux. Pain persisted for over an hour, therefore he came to the ED for further evaluation. He reports still having some intermittent sternal discomfort overnight which has been waxing and waning. No association with exertion or positional changes. He denies any associated dyspnea, nausea or recurrent vomiting. No  recent orthopnea, PND or pitting edema. Reports undergoing a sleep study in the past which was negative for OSA. He reports he did not have any warning signs prior to his STEMI and VF arrest in 2020. His main symptom at that time was feeling flushed across his chest and he has noticed this over the past few days as well. Says he will not undergo a repeat Lexiscan Myoview as he has significant side effects to this in the past.   Initial labs show WBC 11.9, Hgb 13.0, platelets 210, Na+ 134, K+ 4.3 and creatinine 1.15.  Lipase normal at 34. AST 20 and ALT 27. Initial and repeat Hs Troponin values have been slightly trending up from 13, 23, 33 and 40. CXR shows mild atelectasis at the right lung base. EKG shows NSR, HR 94 with RAD and down-sloping ST segment along inferior leads but similar to prior tracings.    Past Medical History:  Diagnosis Date   CAD (coronary artery disease), native coronary artery    10/18 PCI/DES to mRCA, and OM, with total occlusion of dLCx with collaterals.    Essential hypertension    GERD (gastroesophageal reflux disease)    History of kidney stones    Hyperlipemia    SVT (supraventricular tachycardia) 2015   Converted with adenosine   Type 2 diabetes mellitus     Past Surgical History:  Procedure Laterality Date   CARDIAC SURGERY     CORONARY ANGIOPLASTY WITH STENT PLACEMENT  07/15/2017   "2 stents"   CORONARY/GRAFT ACUTE MI REVASCULARIZATION N/A 03/12/2019     Procedure: Coronary/Graft Acute MI Revascularization;  Surgeon: End, Christopher, MD;  Location: [facility];  Service: Cardiovascular;  Laterality: N/A;   LAPAROSCOPIC APPENDECTOMY N/A 11/12/2017   Procedure: APPENDECTOMY LAPAROSCOPIC, REPAIR OF INCARCERATED INCISIONAL HERNIA;  Surgeon: Gross, Steven, MD;  Location: [facility];  Service: General;  Laterality: N/A;   LAPAROSCOPIC CHOLECYSTECTOMY     LEFT HEART CATH AND CORONARY ANGIOGRAPHY N/A 07/15/2017   Procedure: LEFT HEART CATH AND CORONARY ANGIOGRAPHY;   Surgeon: Cooper, Michael, MD;  Location: [facility];  Service: Cardiovascular;  Laterality: N/A;   LEFT HEART CATH AND CORONARY ANGIOGRAPHY N/A 03/12/2019   Procedure: LEFT HEART CATH AND CORONARY ANGIOGRAPHY;  Surgeon: End, Christopher, MD;  Location: [facility];  Service: Cardiovascular;  Laterality: N/A;   SVT ABLATION N/A 04/22/2021   Procedure: SVT ABLATION;  Surgeon: Taylor, Gregg W, MD;  Location: [facility];  Service: Cardiovascular;  Laterality: N/A;     Home Medications:  Prior to Admission medications   Medication Sig Start Date End Date Taking? Authorizing Provider  ALPRAZolam (XANAX) 1 MG tablet Take 1 mg by mouth daily as needed for anxiety.   Yes [provider]  aspirin EC 81 MG tablet Take 1 tablet (81 mg total) by mouth daily with breakfast. 04/05/19  Yes Emokpae, Courage, MD  atorvastatin (LIPITOR) 80 MG tablet TAKE 1 TABLET BY MOUTH EVERYDAY AT BEDTIME 09/09/22  Yes McDowell, Samuel G, MD  BRILINTA 60 MG TABS tablet TAKE 1 TABLET BY MOUTH TWICE A DAY 12/05/22  Yes McDowell, Samuel G, MD  esomeprazole (NEXIUM) 20 MG capsule Take 20 mg by mouth daily at 12 noon.   Yes [provider]  insulin aspart (NOVOLOG FLEXPEN) 100 UNIT/ML FlexPen Inject 16-22 Units into the skin 3 (three) times daily with meals. Sliding scale 10/29/22  Yes Nida, Gebreselassie W, MD  insulin glargine (LANTUS SOLOSTAR) 100 UNIT/ML Solostar Pen INJECT 70 UNITS INTO THE SKIN AT BEDTIME. 01/06/23  Yes Nida, Gebreselassie W, MD  lisinopril (ZESTRIL) 40 MG tablet TAKE 1 TABLET BY MOUTH EVERY DAY 12/05/22  Yes McDowell, Samuel G, MD  nitroGLYCERIN (NITROSTAT) 0.4 MG SL tablet Place 1 tablet (0.4 mg total) under the tongue every 5 (five) minutes x 3 doses as needed for chest pain. 09/02/21  Yes McDowell, Samuel G, MD  Omega-3 1000 MG CAPS Take 1,000 mg by mouth daily.   Yes [provider]  Semaglutide,0.25 or 0.5MG/DOS, (OZEMPIC, 0.25 OR 0.5 MG/DOSE,) 2 MG/1.5ML SOPN Inject  0.5 mg into the skin once a week.   Yes [provider]  blood glucose meter kit and supplies KIT 1 each by Does not apply route 4 (four) times daily. Dispense based on patient and insurance preference. Use up to four times daily as directed. (FOR ICD-9 250.00, 250.01). 02/14/20   Nida, Gebreselassie W, MD  Continuous Blood Gluc Sensor (DEXCOM G7 SENSOR) MISC CHANGE SENSOR EVERY 10 DAYS 12/18/22   Nida, Gebreselassie W, MD  Insulin Pen Needle (B-D ULTRAFINE III SHORT PEN) 31G X 8 MM MISC USE TO INJECT INSULIN 4 (FOUR) TIMES DAILY. 12/10/22   Nida, Gebreselassie W, MD    Inpatient Medications: Scheduled Meds:  aspirin EC  81 mg Oral Q breakfast   atorvastatin  80 mg Oral Daily   heparin  4,000 Units Intravenous Once   insulin aspart  0-15 Units Subcutaneous TID WC   insulin aspart  0-5 Units Subcutaneous QHS   insulin detemir  55 Units Subcutaneous QHS     lisinopril  40 mg Oral Daily   ticagrelor  60 mg Oral BID   Continuous Infusions:  sodium chloride     Followed by   sodium chloride     heparin     PRN Meds: acetaminophen **OR** acetaminophen, ALPRAZolam, morphine injection, ondansetron **OR** ondansetron (ZOFRAN) IV, oxyCODONE  Allergies:    Allergies  Allergen Reactions   Bee Venom Anaphylaxis and Swelling   Penicillins Hives, Itching and Rash    Has patient had a PCN reaction causing immediate rash, facial/tongue/throat swelling, SOB or lightheadedness with hypotension: No  Has patient had a PCN reaction causing severe rash involving mucus membranes or skin necrosis: No  Has patient had a PCN reaction that required hospitalization: No  Has patient had a PCN reaction occurring within the last 10 years: No  If all of the above answers are "NO", then may proceed with Cephalosporin use.  Has patient had a PCN reaction causing immediate rash, facial/tongue/throat swelling, SOB or lightheadedness with hypotension: Yes, Has patient had a PCN reaction causing severe rash  involving mucus membranes or skin necrosis: No, Has patient had a PCN reaction that required hospitalization: No, Has patient had a PCN reaction occurring within the last 10 years: No, If all of the above answers are "NO", then may proceed with Cephalosporin use.    Social History:   Social History   Socioeconomic History   Marital status: Married    Spouse name: Not on file   Number of children: Not on file   Years of education: Not on file   Highest education level: Not on file  Occupational History   Occupation: Superintendent    Employer: [employer]  Tobacco Use   Smoking status: Light Smoker    Packs/day: 0.50    Years: 26.00    Additional pack years: 0.00    Total pack years: 13.00    Types: Cigarettes    Last attempt to quit: 03/07/2019    Years since quitting: 3.8   Smokeless tobacco: Never  Vaping Use   Vaping Use: Never used  Substance and Sexual Activity   Alcohol use: No   Drug use: No   Sexual activity: Yes    Birth control/protection: None  Other Topics Concern   Not on file  Social History Narrative   Not on file   Social Determinants of Health   Financial Resource Strain: Not on file  Food Insecurity: Not on file  Transportation Needs: Not on file  Physical Activity: Not on file  Stress: Not on file  Social Connections: Not on file  Intimate Partner Violence: Not on file    Family History:    Family History  Problem Relation Age of Onset   Heart disease Mother 62       CABG   Diabetes Mother    Gout Mother    Heart attack Father 57     ROS:  Please see the history of present illness.   All other ROS reviewed and negative.     Physical Exam/Data:   Vitals:   01/16/23 0600 01/16/23 0630 01/16/23 0636 01/16/23 0745  BP:   133/64 137/65  Pulse: 71 76 69 72  Resp: 16 20 15 15  Temp:      TempSrc:      SpO2: 98% 97% 97% 99%  Weight:      Height:       No intake or output data in the 24 hours ending 01/16/23 0851       01/15/2023    8:28 PM 10/29/2022    4:03 PM 10/05/2022    9:54 AM  Last 3 Weights  Weight (lbs) 294 lb 8.6 oz 294 lb 9.6 oz 285 lb  Weight (kg) 133.6 kg 133.63 kg 129.275 kg     Body mass index is 41.08 kg/m.  General: Pleasant male appearing in no acute distress. HEENT: normal Neck: no JVD or carotid bruits.  Vascular: No carotid bruits; Distal pulses 2+ bilaterally Cardiac:  normal S1, S2; RRR; no murmur. Lungs:  clear to auscultation bilaterally, no wheezing, rhonchi or rales  Abd: soft, nontender, no hepatomegaly  Ext: no pitting edema Musculoskeletal:  No deformities, BUE and BLE strength normal and equal Skin: warm and dry  Neuro:  CNs 2-12 intact, no focal abnormalities noted Psych:  Normal affect   EKG:  The EKG was personally reviewed and demonstrates: NSR, HR 94 with RAD and down-sloping ST segment along inferior leads but similar to prior tracings.   Telemetry:  Telemetry was personally reviewed and demonstrates: Sinus bradycardia, HR in 50's.   Relevant CV Studies:  Cardiac Catheterization: 03/2019 Conclusions: Severe two-vessel CAD with 90% stenosis of proximal LAD just beyond D1 with thrombos (some of which had embolized to the apical LAD) and 99% in-stent restenosis of OM1 and 90% stenosis of jailed mid LCx. Mild in-stent restenosis involving the mid RCA. Moderately elevated left ventricular filling pressure with suggestion of anterior hypokinesis on the left ventriculogram (suboptimal opacification with hand-injection). Successful PCI to proximal LAD using Synergy 3.0 x 2 mm DES (post-dilated proximally to 4.0 mm) with 0% residual stenosis and TIMI-3 flow. Success balloon angioplasty to LCx/OM1 stent using [place] Euphora 2.25 x 8 mm and Sapphire 2.5 x 12 mm balloons with reduction in stenosis of OM1 from 99% to 40% and mid LCx from 90% to 60% and restoration of TIMI-3 flow.  Given improving chest pain, relatively small vessel size with need for complex bifurcation  intervention, and radiation/contrast doses administered to that point, decision was made to forego additional intervention to this vessel.   Recommendations: Continue tirofiban infusion x 18 hours. Dual antiplatelet therapy with aspirin and ticagrelor for at least 12 months, ideally longer. Aggressive secondary prevention. Obtain transthoracic echocardiogram to assess LV function. If patient has refractory chest pain, complex bifurcation intervention to previously stented LCx/OM1 and AV groove LCx could be considered, though I worry about restenosis in the future given aggressive restenosis since prior PCI in 2018, relatively small vessel size, and likely need for culotte stenting of the LCx.  Echocardiogram: 07/2019 IMPRESSIONS     1. Left ventricular ejection fraction, by visual estimation, is 65 to  70%. The left ventricle has hyperdynamic function. There is mildly  increased left ventricular posterior wall hypertrophy.   2. Elevated left ventricular end-diastolic pressure.   3. Left ventricular diastolic Doppler parameters are consistent with  impaired relaxation pattern of LV diastolic filling.   4. Global right ventricle has normal systolic function.The right  ventricular size is normal. Right vetricular wall thickness was not  assessed.   5. Left atrial size was normal.   6. Right atrial size was normal.   7. The mitral valve is grossly normal. Mild mitral valve regurgitation.   8. The tricuspid valve is grossly normal. Tricuspid valve regurgitation  is trivial.   9. The aortic valve is tricuspid Aortic valve regurgitation was not  visualized by color flow Doppler. Mild aortic valve sclerosis without  stenosis.  10. The pulmonic valve was grossly   normal. Pulmonic valve regurgitation is  not visualized by color flow Doppler.  11. Normal pulmonary artery systolic pressure.  12. The inferior vena cava IVC is small suggestive of low RAP.    Laboratory Data:  High Sensitivity  Troponin:   Recent Labs  Lab 01/15/23 2059 01/15/23 2234 01/16/23 0100 01/16/23 0259  TROPONINIHS 13 23* 33* 40*     Chemistry Recent Labs  Lab 01/15/23 2059 01/16/23 0259  NA 134* 135  K 4.3 4.1  CL 100 101  CO2 25 27  GLUCOSE 238* 127*  BUN 14 14  CREATININE 1.15 1.01  CALCIUM 8.6* 8.8*  MG  --  2.2  GFRNONAA >60 >60  ANIONGAP 9 7    Recent Labs  Lab 01/16/23 0259  PROT 6.6  ALBUMIN 3.5  AST 20  ALT 27  ALKPHOS 85  BILITOT 0.9   Lipids No results for input(s): "CHOL", "TRIG", "HDL", "LABVLDL", "LDLCALC", "CHOLHDL" in the last 168 hours.  Hematology Recent Labs  Lab 01/15/23 2059 01/16/23 0259  WBC 11.9* 11.5*  RBC 4.75 4.88  HGB 13.0 13.3  HCT 39.9 41.3  MCV 84.0 84.6  MCH 27.4 27.3  MCHC 32.6 32.2  RDW 14.1 14.1  PLT 210 218   Thyroid No results for input(s): "TSH", "FREET4" in the last 168 hours.  BNPNo results for input(s): "BNP", "PROBNP" in the last 168 hours.  DDimer No results for input(s): "DDIMER" in the last 168 hours.   Radiology/Studies:  DG Chest 2 View  Result Date: 01/15/2023 CLINICAL DATA:  Chest and epigastric pain. EXAM: CHEST - 2 VIEW COMPARISON:  03/09/2021. FINDINGS: Heart is enlarged and the mediastinal contour is within normal limits. There is mild atelectasis at the right lung base no consolidation, effusion, or pneumothorax. Mild degenerative changes are noted in the thoracic spine. Surgical clips are present in the upper abdomen. IMPRESSION: Mild atelectasis at the right lung base. Electronically Signed   By: Laura  Taylor M.D.   On: 01/15/2023 21:18     Assessment and Plan:   1. Chest Discomfort concerning for Accelerating Angina/Elevated Troponin Values - Presents with sternal chest discomfort which feels different from his prior acid reflux. Of concern, he has noted worsening fatigue over the past few weeks and also reports episodes of flushing along his chest which is the only symptom he had prior to his STEMI in 2020  as he did not experience classic angina prior to his presentation. - Hs troponin values have been trending up slightly from 13 to 23, 33 and 40 on most recent check. EKG is without acute ST changes. Echo pending.  - Will review with Dr. Keldon Lassen but anticipate a repeat cardiac catheterization for definitive evaluation given his symptoms, elevated enzymes and cardiac history. The patient does refuse a repeat Lexiscan Myoview as he did not tolerate this well in the past. He is in agreement with proceeding with a repeat cardiac catheterization. The patient understands that risks include but are not limited to stroke (1 in 1000), death (1 in 1000), kidney failure [usually temporary] (1 in 500), bleeding (1 in 200), allergic reaction [possibly serious] (1 in 200).   - Given that his enzymes are trending up and he is still having intermittent discomfort, will switch from DVT prophylaxis Heparin to full anticoagulation. Continue ASA, Brilinta and statin therapy. Not on a beta-blocker given HR in the 50's.   2. CAD - He is s/p DES to RCA and OM in 07/2017 and STEMI in 03/2019 complicated by   VF arrest and received DES to LAD and staged PTCA to restenosed OM. - Will plan for a repeat cardiac catheterization as discussed above.  - Continue ASA 81mg daily, Brilinta 60mg BID (may need to be titrated to 90mg BID pending cath results) and Atorvastatin 80mg daily. Not on a beta-blocker due to bradycardia.   3. HFimpEF - His EF was at 40-45% in 03/2019 and improved to 65-70% by repeat imaging in 07/2019.  - He denies any recent fluid issues and does not appear volume overloaded by examination today. Repeat echo pending.  4. SVT  - He is s/p ablation in 04/2021. Has been maintaining NSR by review of telemetry.   5. HTN - His BP has overall been well-controlled, at 133/64 on most recent check. He has been continued on Lisinopril 40 mg daily.  6. HLD - FLP in 09/2022 showed total cholesterol 111, triglycerides 197,  HDL 19 and LDL 59. Continue Atorvastatin 80 mg daily.  7. Type 2 DM - Hgb A1c was at 7.2 in 10/2022 with repeat pending. Management per the admitting team.   For questions or updates, please contact [facility] Please consult [email or web address] for contact info under    Signed, Brittany M Strader, PA-C  01/16/2023 8:51 AM   Attending note   Patient seen and discussed with PA Strader, I agree with her documentation. 46 yo male history of CAD with prior interventions as outlined above presents with chest pains. Somewhat atypical symptoms however similar to his prior angina in the past. EKG without acute ischemic changes, mildly elevated troponin that is rising. Given his extensive CAD history, symptoms, and rising troponin concern for recurrent obstructive disease is high. He would best be further managed/evaluated with heart catheterization which he is in agreement with. Plan for transfer to [facility] for cath. Medical therapy with ASA 81, atorva 80, lisinopril 40, has been on brillinta at home long term. Would start hep gtt.     Shawne Eskelson MD  

## 2023-01-16 NOTE — TOC Benefit Eligibility Note (Signed)
Patient Product/process development scientist completed.    The patient is currently admitted and upon discharge could be taking Jardiance 10 mg.  The current 30 day co-pay is $75.00.   The patient is currently admitted and upon discharge could be taking Farxiga 10 mg.  Requires Prior Authorization  The patient is currently admitted and upon discharge could be taking Vascepa 1 g.  Requires Prior Authorization  The patient is insured through [employer]   This test claim was processed through [facility]- copay amounts may vary at other pharmacies due to pharmacy/plan contracts, or as the patient moves through the different stages of their insurance plan.  Roland Earl, CPHT Pharmacy Patient Advocate Specialist [facility] Patient Advocate Team Direct Number: [phone]  Fax: [phone]

## 2023-01-16 NOTE — Progress Notes (Signed)
*  PRELIMINARY RESULTS* Echocardiogram 2D Echocardiogram has been performed with Definity.  Stacey Drain 01/16/2023, 9:34 AM

## 2023-01-16 NOTE — ED Notes (Signed)
AC called to bring pt's medication from main pharmacy

## 2023-01-16 NOTE — H&P (View-Only) (Signed)
Cardiology Consultation   Patient ID: Lance [name] MRN: [id number]; DOB: March 25, 1976  Admit date: 01/15/2023 Date of Consult: 01/16/2023  PCP:  Lianne Moris, PA-C   [facility] Providers Cardiologist:  Nona Dell, MD  EP: Dr. Ladona Ridgel  Patient Profile:   Lance [name] is a 47 y.o. male with a hx of CAD (s/p DES to RCA and OM in 07/2017, STEMI in 03/2019 complicated by VF arrest and received DES to LAD and staged PTCA to restenosed OM), HFimpEF (EF 40-45% in 03/2019 and improved to 65-70% by repeat imaging in 07/2019), SVT (s/p ablation in 04/2021), HTN, HLD, tobacco use and Type 2 DM who is being seen 01/16/2023 for the evaluation of chest pain at the request of Dr. Carren Rang.  History of Present Illness:   Lance [name] was last examined by Dr. Diona Browner in 07/2022 and denied any recent anginal symptoms. He did report occasional palpitations but they were very brief and spontaneously resolved within a few seconds. It was recommended to continue long-term DAPT with ASA and Brilinta and he was also continued on Lisinopril and Lipitor.  He presented to [facility] ED on 01/15/2023 for evaluation of chest/epigastric pain which started approximately 1 hour prior to arrival. In talking with the patient and his wife today, he reports working in [employer] and has noticed more fatigue over the past few weeks. Denies any specific exertional chest pain or dyspnea on exertion. Last night, he developed a sternal discomfort while consuming chicken pot pie. Reports he had the pain and then had 1 episode of vomiting. He has known acid reflux but reports symptoms feel different from acid reflux. Pain persisted for over an hour, therefore he came to the ED for further evaluation. He reports still having some intermittent sternal discomfort overnight which has been waxing and waning. No association with exertion or positional changes. He denies any associated dyspnea, nausea or recurrent vomiting. No  recent orthopnea, PND or pitting edema. Reports undergoing a sleep study in the past which was negative for OSA. He reports he did not have any warning signs prior to his STEMI and VF arrest in 2020. His main symptom at that time was feeling flushed across his chest and he has noticed this over the past few days as well. Says he will not undergo a repeat Lexiscan Myoview as he has significant side effects to this in the past.   Initial labs show WBC 11.9, Hgb 13.0, platelets 210, Na+ 134, K+ 4.3 and creatinine 1.15.  Lipase normal at 34. AST 20 and ALT 27. Initial and repeat Hs Troponin values have been slightly trending up from 13, 23, 33 and 40. CXR shows mild atelectasis at the right lung base. EKG shows NSR, HR 94 with RAD and down-sloping ST segment along inferior leads but similar to prior tracings.    Past Medical History:  Diagnosis Date   CAD (coronary artery disease), native coronary artery    10/18 PCI/DES to mRCA, and OM, with total occlusion of dLCx with collaterals.    Essential hypertension    GERD (gastroesophageal reflux disease)    History of kidney stones    Hyperlipemia    SVT (supraventricular tachycardia) 2015   Converted with adenosine   Type 2 diabetes mellitus     Past Surgical History:  Procedure Laterality Date   CARDIAC SURGERY     CORONARY ANGIOPLASTY WITH STENT PLACEMENT  07/15/2017   "2 stents"   CORONARY/GRAFT ACUTE MI REVASCULARIZATION N/A 03/12/2019  Procedure: Coronary/Graft Acute MI Revascularization;  Surgeon: Yvonne Kendall, MD;  Location: [facility] INVASIVE CV LAB;  Service: Cardiovascular;  Laterality: N/A;   LAPAROSCOPIC APPENDECTOMY N/A 11/12/2017   Procedure: APPENDECTOMY LAPAROSCOPIC, REPAIR OF INCARCERATED INCISIONAL HERNIA;  Surgeon: Karie Soda, MD;  Location: [facility] ORS;  Service: General;  Laterality: N/A;   LAPAROSCOPIC CHOLECYSTECTOMY     LEFT HEART CATH AND CORONARY ANGIOGRAPHY N/A 07/15/2017   Procedure: LEFT HEART CATH AND CORONARY ANGIOGRAPHY;   Surgeon: Tonny Bollman, MD;  Location: [facility] INVASIVE CV LAB;  Service: Cardiovascular;  Laterality: N/A;   LEFT HEART CATH AND CORONARY ANGIOGRAPHY N/A 03/12/2019   Procedure: LEFT HEART CATH AND CORONARY ANGIOGRAPHY;  Surgeon: Yvonne Kendall, MD;  Location: [facility] INVASIVE CV LAB;  Service: Cardiovascular;  Laterality: N/A;   SVT ABLATION N/A 04/22/2021   Procedure: SVT ABLATION;  Surgeon: Marinus Maw, MD;  Location: [facility] INVASIVE CV LAB;  Service: Cardiovascular;  Laterality: N/A;     Home Medications:  Prior to Admission medications   Medication Sig Start Date End Date Taking? Authorizing Provider  ALPRAZolam Prudy Feeler) 1 MG tablet Take 1 mg by mouth daily as needed for anxiety.   Yes [provider]  aspirin EC 81 MG tablet Take 1 tablet (81 mg total) by mouth daily with breakfast. 04/05/19  Yes Emokpae, Courage, MD  atorvastatin (LIPITOR) 80 MG tablet TAKE 1 TABLET BY MOUTH EVERYDAY AT BEDTIME 09/09/22  Yes Jonelle Sidle, MD  BRILINTA 60 MG TABS tablet TAKE 1 TABLET BY MOUTH TWICE A DAY 12/05/22  Yes Jonelle Sidle, MD  esomeprazole (NEXIUM) 20 MG capsule Take 20 mg by mouth daily at 12 noon.   Yes [provider]  insulin aspart (NOVOLOG FLEXPEN) 100 UNIT/ML FlexPen Inject 16-22 Units into the skin 3 (three) times daily with meals. Sliding scale 10/29/22  Yes Nida, Denman George, MD  insulin glargine (LANTUS SOLOSTAR) 100 UNIT/ML Solostar Pen INJECT 70 UNITS INTO THE SKIN AT BEDTIME. 01/06/23  Yes Nida, Denman George, MD  lisinopril (ZESTRIL) 40 MG tablet TAKE 1 TABLET BY MOUTH EVERY DAY 12/05/22  Yes Jonelle Sidle, MD  nitroGLYCERIN (NITROSTAT) 0.4 MG SL tablet Place 1 tablet (0.4 mg total) under the tongue every 5 (five) minutes x 3 doses as needed for chest pain. 09/02/21  Yes Jonelle Sidle, MD  Omega-3 1000 MG CAPS Take 1,000 mg by mouth daily.   Yes [provider]  Semaglutide,0.25 or 0.5MG /DOS, (OZEMPIC, 0.25 OR 0.5 MG/DOSE,) 2 MG/1.5ML SOPN Inject  0.5 mg into the skin once a week.   Yes [provider]  blood glucose meter kit and supplies KIT 1 each by Does not apply route 4 (four) times daily. Dispense based on patient and insurance preference. Use up to four times daily as directed. (FOR ICD-9 250.00, 250.01). 02/14/20   Roma Kayser, MD  Continuous Blood Gluc Sensor (DEXCOM G7 SENSOR) MISC CHANGE SENSOR EVERY 10 DAYS 12/18/22   Roma Kayser, MD  Insulin Pen Needle (B-D ULTRAFINE III SHORT PEN) 31G X 8 MM MISC USE TO INJECT INSULIN 4 (FOUR) TIMES DAILY. 12/10/22   Roma Kayser, MD    Inpatient Medications: Scheduled Meds:  aspirin EC  81 mg Oral Q breakfast   atorvastatin  80 mg Oral Daily   heparin  4,000 Units Intravenous Once   insulin aspart  0-15 Units Subcutaneous TID WC   insulin aspart  0-5 Units Subcutaneous QHS   insulin detemir  55 Units Subcutaneous QHS  lisinopril  40 mg Oral Daily   ticagrelor  60 mg Oral BID   Continuous Infusions:  sodium chloride     Followed by   sodium chloride     heparin     PRN Meds: acetaminophen **OR** acetaminophen, ALPRAZolam, morphine injection, ondansetron **OR** ondansetron (ZOFRAN) IV, oxyCODONE  Allergies:    Allergies  Allergen Reactions   Bee Venom Anaphylaxis and Swelling   Penicillins Hives, Itching and Rash    Has patient had a PCN reaction causing immediate rash, facial/tongue/throat swelling, SOB or lightheadedness with hypotension: No  Has patient had a PCN reaction causing severe rash involving mucus membranes or skin necrosis: No  Has patient had a PCN reaction that required hospitalization: No  Has patient had a PCN reaction occurring within the last 10 years: No  If all of the above answers are "NO", then may proceed with Cephalosporin use.  Has patient had a PCN reaction causing immediate rash, facial/tongue/throat swelling, SOB or lightheadedness with hypotension: Yes, Has patient had a PCN reaction causing severe rash  involving mucus membranes or skin necrosis: No, Has patient had a PCN reaction that required hospitalization: No, Has patient had a PCN reaction occurring within the last 10 years: No, If all of the above answers are "NO", then may proceed with Cephalosporin use.    Social History:   Social History   Socioeconomic History   Marital status: Married    Spouse name: Not on file   Number of children: Not on file   Years of education: Not on file   Highest education level: Not on file  Occupational History   Occupation: Superintendent    Employer: [employer]  Tobacco Use   Smoking status: Light Smoker    Packs/day: 0.50    Years: 26.00    Additional pack years: 0.00    Total pack years: 13.00    Types: Cigarettes    Last attempt to quit: 03/07/2019    Years since quitting: 3.8   Smokeless tobacco: Never  Vaping Use   Vaping Use: Never used  Substance and Sexual Activity   Alcohol use: No   Drug use: No   Sexual activity: Yes    Birth control/protection: None  Other Topics Concern   Not on file  Social History Narrative   Not on file   Social Determinants of Health   Financial Resource Strain: Not on file  Food Insecurity: Not on file  Transportation Needs: Not on file  Physical Activity: Not on file  Stress: Not on file  Social Connections: Not on file  Intimate Partner Violence: Not on file    Family History:    Family History  Problem Relation Age of Onset   Heart disease Mother 78       CABG   Diabetes Mother    Gout Mother    Heart attack Father 47     ROS:  Please see the history of present illness.   All other ROS reviewed and negative.     Physical Exam/Data:   Vitals:   01/16/23 0600 01/16/23 0630 01/16/23 0636 01/16/23 0745  BP:   133/64 137/65  Pulse: 71 76 69 72  Resp: 16 20 15 15   Temp:      TempSrc:      SpO2: 98% 97% 97% 99%  Weight:      Height:       No intake or output data in the 24 hours ending 01/16/23 0851  01/15/2023    8:28 PM 10/29/2022    4:03 PM 10/05/2022    9:54 AM  Last 3 Weights  Weight (lbs) 294 lb 8.6 oz 294 lb 9.6 oz 285 lb  Weight (kg) 133.6 kg 133.63 kg 129.275 kg     Body mass index is 41.08 kg/m.  General: Pleasant male appearing in no acute distress. HEENT: normal Neck: no JVD or carotid bruits.  Vascular: No carotid bruits; Distal pulses 2+ bilaterally Cardiac:  normal S1, S2; RRR; no murmur. Lungs:  clear to auscultation bilaterally, no wheezing, rhonchi or rales  Abd: soft, nontender, no hepatomegaly  Ext: no pitting edema Musculoskeletal:  No deformities, BUE and BLE strength normal and equal Skin: warm and dry  Neuro:  CNs 2-12 intact, no focal abnormalities noted Psych:  Normal affect   EKG:  The EKG was personally reviewed and demonstrates: NSR, HR 94 with RAD and down-sloping ST segment along inferior leads but similar to prior tracings.   Telemetry:  Telemetry was personally reviewed and demonstrates: Sinus bradycardia, HR in 50's.   Relevant CV Studies:  Cardiac Catheterization: 03/2019 Conclusions: Severe two-vessel CAD with 90% stenosis of proximal LAD just beyond D1 with thrombos (some of which had embolized to the apical LAD) and 99% in-stent restenosis of OM1 and 90% stenosis of jailed mid LCx. Mild in-stent restenosis involving the mid RCA. Moderately elevated left ventricular filling pressure with suggestion of anterior hypokinesis on the left ventriculogram (suboptimal opacification with hand-injection). Successful PCI to proximal LAD using Synergy 3.0 x 2 mm DES (post-dilated proximally to 4.0 mm) with 0% residual stenosis and TIMI-3 flow. Success balloon angioplasty to LCx/OM1 stent using [place] Euphora 2.25 x 8 mm and Sapphire 2.5 x 12 mm balloons with reduction in stenosis of OM1 from 99% to 40% and mid LCx from 90% to 60% and restoration of TIMI-3 flow.  Given improving chest pain, relatively small vessel size with need for complex bifurcation  intervention, and radiation/contrast doses administered to that point, decision was made to forego additional intervention to this vessel.   Recommendations: Continue tirofiban infusion x 18 hours. Dual antiplatelet therapy with aspirin and ticagrelor for at least 12 months, ideally longer. Aggressive secondary prevention. Obtain transthoracic echocardiogram to assess LV function. If patient has refractory chest pain, complex bifurcation intervention to previously stented LCx/OM1 and AV groove LCx could be considered, though I worry about restenosis in the future given aggressive restenosis since prior PCI in 2018, relatively small vessel size, and likely need for culotte stenting of the LCx.  Echocardiogram: 07/2019 IMPRESSIONS     1. Left ventricular ejection fraction, by visual estimation, is 65 to  70%. The left ventricle has hyperdynamic function. There is mildly  increased left ventricular posterior wall hypertrophy.   2. Elevated left ventricular end-diastolic pressure.   3. Left ventricular diastolic Doppler parameters are consistent with  impaired relaxation pattern of LV diastolic filling.   4. Global right ventricle has normal systolic function.The right  ventricular size is normal. Right vetricular wall thickness was not  assessed.   5. Left atrial size was normal.   6. Right atrial size was normal.   7. The mitral valve is grossly normal. Mild mitral valve regurgitation.   8. The tricuspid valve is grossly normal. Tricuspid valve regurgitation  is trivial.   9. The aortic valve is tricuspid Aortic valve regurgitation was not  visualized by color flow Doppler. Mild aortic valve sclerosis without  stenosis.  10. The pulmonic valve was grossly  normal. Pulmonic valve regurgitation is  not visualized by color flow Doppler.  11. Normal pulmonary artery systolic pressure.  12. The inferior vena cava IVC is small suggestive of low RAP.    Laboratory Data:  High Sensitivity  Troponin:   Recent Labs  Lab 01/15/23 2059 01/15/23 2234 01/16/23 0100 01/16/23 0259  TROPONINIHS 13 23* 33* 40*     Chemistry Recent Labs  Lab 01/15/23 2059 01/16/23 0259  NA 134* 135  K 4.3 4.1  CL 100 101  CO2 25 27  GLUCOSE 238* 127*  BUN 14 14  CREATININE 1.15 1.01  CALCIUM 8.6* 8.8*  MG  --  2.2  GFRNONAA >60 >60  ANIONGAP 9 7    Recent Labs  Lab 01/16/23 0259  PROT 6.6  ALBUMIN 3.5  AST 20  ALT 27  ALKPHOS 85  BILITOT 0.9   Lipids No results for input(s): "CHOL", "TRIG", "HDL", "LABVLDL", "LDLCALC", "CHOLHDL" in the last 168 hours.  Hematology Recent Labs  Lab 01/15/23 2059 01/16/23 0259  WBC 11.9* 11.5*  RBC 4.75 4.88  HGB 13.0 13.3  HCT 39.9 41.3  MCV 84.0 84.6  MCH 27.4 27.3  MCHC 32.6 32.2  RDW 14.1 14.1  PLT 210 218   Thyroid No results for input(s): "TSH", "FREET4" in the last 168 hours.  BNPNo results for input(s): "BNP", "PROBNP" in the last 168 hours.  DDimer No results for input(s): "DDIMER" in the last 168 hours.   Radiology/Studies:  DG Chest 2 View  Result Date: 01/15/2023 CLINICAL DATA:  Chest and epigastric pain. EXAM: CHEST - 2 VIEW COMPARISON:  03/09/2021. FINDINGS: Heart is enlarged and the mediastinal contour is within normal limits. There is mild atelectasis at the right lung base no consolidation, effusion, or pneumothorax. Mild degenerative changes are noted in the thoracic spine. Surgical clips are present in the upper abdomen. IMPRESSION: Mild atelectasis at the right lung base. Electronically Signed   By: Thornell Sartorius M.D.   On: 01/15/2023 21:18     Assessment and Plan:   1. Chest Discomfort concerning for Accelerating Angina/Elevated Troponin Values - Presents with sternal chest discomfort which feels different from his prior acid reflux. Of concern, he has noted worsening fatigue over the past few weeks and also reports episodes of flushing along his chest which is the only symptom he had prior to his STEMI in 2020  as he did not experience classic angina prior to his presentation. - Hs troponin values have been trending up slightly from 13 to 23, 33 and 40 on most recent check. EKG is without acute ST changes. Echo pending.  - Will review with Dr. Wyline Mood but anticipate a repeat cardiac catheterization for definitive evaluation given his symptoms, elevated enzymes and cardiac history. The patient does refuse a repeat Lexiscan Myoview as he did not tolerate this well in the past. He is in agreement with proceeding with a repeat cardiac catheterization. The patient understands that risks include but are not limited to stroke (1 in 1000), death (1 in 1000), kidney failure [usually temporary] (1 in 500), bleeding (1 in 200), allergic reaction [possibly serious] (1 in 200).   - Given that his enzymes are trending up and he is still having intermittent discomfort, will switch from DVT prophylaxis Heparin to full anticoagulation. Continue ASA, Brilinta and statin therapy. Not on a beta-blocker given HR in the 50's.   2. CAD - He is s/p DES to RCA and OM in 07/2017 and STEMI in 03/2019 complicated by  VF arrest and received DES to LAD and staged PTCA to restenosed OM. - Will plan for a repeat cardiac catheterization as discussed above.  - Continue ASA 81mg  daily, Brilinta 60mg  BID (may need to be titrated to 90mg  BID pending cath results) and Atorvastatin 80mg  daily. Not on a beta-blocker due to bradycardia.   3. HFimpEF - His EF was at 40-45% in 03/2019 and improved to 65-70% by repeat imaging in 07/2019.  - He denies any recent fluid issues and does not appear volume overloaded by examination today. Repeat echo pending.  4. SVT  - He is s/p ablation in 04/2021. Has been maintaining NSR by review of telemetry.   5. HTN - His BP has overall been well-controlled, at 133/64 on most recent check. He has been continued on Lisinopril 40 mg daily.  6. HLD - FLP in 09/2022 showed total cholesterol 111, triglycerides 197,  HDL 19 and LDL 59. Continue Atorvastatin 80 mg daily.  7. Type 2 DM - Hgb A1c was at 7.2 in 10/2022 with repeat pending. Management per the admitting team.   For questions or updates, please contact [facility] Please consult [email or web address] for contact info under    Signed, Ellsworth Lennox, PA-C  01/16/2023 8:51 AM   Attending note   Patient seen and discussed with PA Iran Ouch, I agree with her documentation. 47 yo male history of CAD with prior interventions as outlined above presents with chest pains. Somewhat atypical symptoms however similar to his prior angina in the past. EKG without acute ischemic changes, mildly elevated troponin that is rising. Given his extensive CAD history, symptoms, and rising troponin concern for recurrent obstructive disease is high. He would best be further managed/evaluated with heart catheterization which he is in agreement with. Plan for transfer to [facility] for cath. Medical therapy with ASA 81, atorva 80, lisinopril 40, has been on brillinta at home long term. Would start hep gtt.     Dina Rich MD

## 2023-01-16 NOTE — Assessment & Plan Note (Signed)
-   Continue Brilinta - Plavix is no longer on home medication list

## 2023-01-16 NOTE — Progress Notes (Signed)
ANTICOAGULATION CONSULT NOTE - Initial Consult  Pharmacy Consult for heparin Indication: chest pain/ACS  Allergies  Allergen Reactions   Bee Venom Anaphylaxis and Swelling   Penicillins Hives, Itching and Rash    Has patient had a PCN reaction causing immediate rash, facial/tongue/throat swelling, SOB or lightheadedness with hypotension: No  Has patient had a PCN reaction causing severe rash involving mucus membranes or skin necrosis: No  Has patient had a PCN reaction that required hospitalization: No  Has patient had a PCN reaction occurring within the last 10 years: No  If all of the above answers are "NO", then may proceed with Cephalosporin use.  Has patient had a PCN reaction causing immediate rash, facial/tongue/throat swelling, SOB or lightheadedness with hypotension: Yes, Has patient had a PCN reaction causing severe rash involving mucus membranes or skin necrosis: No, Has patient had a PCN reaction that required hospitalization: No, Has patient had a PCN reaction occurring within the last 10 years: No, If all of the above answers are "NO", then may proceed with Cephalosporin use.    Patient Measurements: Height: 5\' 11"  (180.3 cm) Weight: 133.6 kg (294 lb 8.6 oz) IBW/kg (Calculated) : 75.3 Heparin Dosing Weight: 106kg  Vital Signs: Temp: 97.8 F (36.6 C) (04/12 0504) Temp Source: Oral (04/12 0504) BP: 133/64 (04/12 0636) Pulse Rate: 69 (04/12 0636)  Labs: Recent Labs    01/15/23 2059 01/15/23 2234 01/16/23 0100 01/16/23 0259  HGB 13.0  --   --  13.3  HCT 39.9  --   --  41.3  PLT 210  --   --  218  CREATININE 1.15  --   --  1.01  TROPONINIHS 13 23* 33* 40*    Estimated Creatinine Clearance: 127.5 mL/min (by C-G formula based on SCr of 1.01 mg/dL).   Medical History: Past Medical History:  Diagnosis Date   CAD (coronary artery disease), native coronary artery    10/18 PCI/DES to mRCA, and OM, with total occlusion of dLCx with collaterals.    Essential  hypertension    GERD (gastroesophageal reflux disease)    History of kidney stones    Hyperlipemia    SVT (supraventricular tachycardia) 2015   Converted with adenosine   Type 2 diabetes mellitus     Assessment: 47 year old male with known CAD presents to [facility] with chest pain. New orders to start IV heparin for rule out ACS. Patient is not on anticoagulation prior to admit. CBC within normal limits.    Goal of Therapy:  Heparin level 0.3-0.7 units/ml Monitor platelets by anticoagulation protocol: Yes   Plan:  Give 4000 units bolus x 1 Start heparin infusion at 1600 units/hr Check anti-Xa level in 6 hours and daily while on heparin Continue to monitor H&H and platelets  Sheppard Coil PharmD., BCPS Clinical Pharmacist 01/16/2023 8:49 AM

## 2023-01-16 NOTE — Assessment & Plan Note (Signed)
-   Counseled on importance of cessation - Declines nicotine patch at this time 

## 2023-01-17 DIAGNOSIS — Z833 Family history of diabetes mellitus: Secondary | ICD-10-CM | POA: Diagnosis not present

## 2023-01-17 DIAGNOSIS — I252 Old myocardial infarction: Secondary | ICD-10-CM | POA: Diagnosis not present

## 2023-01-17 DIAGNOSIS — Z79899 Other long term (current) drug therapy: Secondary | ICD-10-CM | POA: Diagnosis not present

## 2023-01-17 DIAGNOSIS — Z7902 Long term (current) use of antithrombotics/antiplatelets: Secondary | ICD-10-CM | POA: Diagnosis not present

## 2023-01-17 DIAGNOSIS — Z720 Tobacco use: Secondary | ICD-10-CM | POA: Diagnosis not present

## 2023-01-17 DIAGNOSIS — I110 Hypertensive heart disease with heart failure: Secondary | ICD-10-CM | POA: Diagnosis present

## 2023-01-17 DIAGNOSIS — E7849 Other hyperlipidemia: Secondary | ICD-10-CM

## 2023-01-17 DIAGNOSIS — Z8249 Family history of ischemic heart disease and other diseases of the circulatory system: Secondary | ICD-10-CM | POA: Diagnosis not present

## 2023-01-17 DIAGNOSIS — I25110 Atherosclerotic heart disease of native coronary artery with unstable angina pectoris: Secondary | ICD-10-CM | POA: Diagnosis present

## 2023-01-17 DIAGNOSIS — I5032 Chronic diastolic (congestive) heart failure: Secondary | ICD-10-CM | POA: Diagnosis present

## 2023-01-17 DIAGNOSIS — Z6841 Body Mass Index (BMI) 40.0 and over, adult: Secondary | ICD-10-CM | POA: Diagnosis not present

## 2023-01-17 DIAGNOSIS — Z87442 Personal history of urinary calculi: Secondary | ICD-10-CM | POA: Diagnosis not present

## 2023-01-17 DIAGNOSIS — E1159 Type 2 diabetes mellitus with other circulatory complications: Secondary | ICD-10-CM | POA: Diagnosis present

## 2023-01-17 DIAGNOSIS — Z794 Long term (current) use of insulin: Secondary | ICD-10-CM | POA: Diagnosis not present

## 2023-01-17 DIAGNOSIS — Z7985 Long-term (current) use of injectable non-insulin antidiabetic drugs: Secondary | ICD-10-CM | POA: Diagnosis not present

## 2023-01-17 DIAGNOSIS — Y831 Surgical operation with implant of artificial internal device as the cause of abnormal reaction of the patient, or of later complication, without mention of misadventure at the time of the procedure: Secondary | ICD-10-CM | POA: Diagnosis present

## 2023-01-17 DIAGNOSIS — F17210 Nicotine dependence, cigarettes, uncomplicated: Secondary | ICD-10-CM | POA: Diagnosis present

## 2023-01-17 DIAGNOSIS — E782 Mixed hyperlipidemia: Secondary | ICD-10-CM | POA: Diagnosis present

## 2023-01-17 DIAGNOSIS — Z28310 Unvaccinated for covid-19: Secondary | ICD-10-CM | POA: Diagnosis not present

## 2023-01-17 DIAGNOSIS — J9811 Atelectasis: Secondary | ICD-10-CM | POA: Diagnosis present

## 2023-01-17 DIAGNOSIS — Z7982 Long term (current) use of aspirin: Secondary | ICD-10-CM | POA: Diagnosis not present

## 2023-01-17 DIAGNOSIS — I5022 Chronic systolic (congestive) heart failure: Secondary | ICD-10-CM | POA: Insufficient documentation

## 2023-01-17 DIAGNOSIS — Z880 Allergy status to penicillin: Secondary | ICD-10-CM | POA: Diagnosis not present

## 2023-01-17 DIAGNOSIS — I200 Unstable angina: Secondary | ICD-10-CM | POA: Diagnosis present

## 2023-01-17 DIAGNOSIS — T82855A Stenosis of coronary artery stent, initial encounter: Secondary | ICD-10-CM | POA: Diagnosis present

## 2023-01-17 DIAGNOSIS — I471 Supraventricular tachycardia, unspecified: Secondary | ICD-10-CM | POA: Diagnosis present

## 2023-01-17 DIAGNOSIS — K219 Gastro-esophageal reflux disease without esophagitis: Secondary | ICD-10-CM | POA: Diagnosis present

## 2023-01-17 DIAGNOSIS — Z8674 Personal history of sudden cardiac arrest: Secondary | ICD-10-CM | POA: Diagnosis not present

## 2023-01-17 LAB — BASIC METABOLIC PANEL
Anion gap: 7 (ref 5–15)
BUN: 17 mg/dL (ref 6–20)
CO2: 23 mmol/L (ref 22–32)
Calcium: 8.5 mg/dL — ABNORMAL LOW (ref 8.9–10.3)
Chloride: 102 mmol/L (ref 98–111)
Creatinine, Ser: 1.08 mg/dL (ref 0.61–1.24)
GFR, Estimated: >60 mL/min (ref >60)
Glucose, Bld: 166 mg/dL — ABNORMAL HIGH (ref 70–99)
Potassium: 4.3 mmol/L (ref 3.5–5.1)
Sodium: 132 mmol/L — ABNORMAL LOW (ref 135–145)

## 2023-01-17 LAB — CBC
HCT: 39.9 % (ref 39.0–52.0)
Hemoglobin: 13.2 g/dL (ref 13.0–17.0)
MCH: 27.4 pg (ref 26.0–34.0)
MCHC: 33.1 g/dL (ref 30.0–36.0)
MCV: 83 fL (ref 80.0–100.0)
Platelets: 220 10*3/uL (ref 150–400)
RBC: 4.81 MIL/uL (ref 4.22–5.81)
RDW: 14 % (ref 11.5–15.5)
WBC: 11.3 10*3/uL — ABNORMAL HIGH (ref 4.0–10.5)
nRBC: 0 % (ref 0.0–0.2)

## 2023-01-17 LAB — GLUCOSE, CAPILLARY
Glucose-Capillary: 285 mg/dL — ABNORMAL HIGH (ref 70–99)
Glucose-Capillary: 110 mg/dL — ABNORMAL HIGH (ref 70–99)

## 2023-01-17 MED ORDER — TICAGRELOR 90 MG PO TABS: 90.0000 mg | ORAL_TABLET | Freq: Two times a day (BID) | ORAL | 11 refills | Status: DC

## 2023-01-17 NOTE — Progress Notes (Addendum)
Progress Note  Patient Name: Lance [name] Date of Encounter: 01/17/2023  Primary Cardiologist: Nona Dell, MD  Subjective   No acute events overnight, no chest pain. Right radial access site intact and no swelling/bleeding or oozing.  Inpatient Medications    Scheduled Meds:  aspirin  81 mg Oral Daily   aspirin EC  81 mg Oral Q breakfast   atorvastatin  80 mg Oral Daily   enoxaparin (LOVENOX) injection  40 mg Subcutaneous Q24H   insulin aspart  0-15 Units Subcutaneous TID WC   insulin aspart  0-5 Units Subcutaneous QHS   insulin detemir  55 Units Subcutaneous QHS   lisinopril  40 mg Oral Daily   sodium chloride flush  3 mL Intravenous Q12H   sodium chloride flush  3 mL Intravenous Q12H   ticagrelor  90 mg Oral BID   Continuous Infusions:  sodium chloride     sodium chloride     PRN Meds: sodium chloride, sodium chloride, acetaminophen **OR** acetaminophen, acetaminophen, ALPRAZolam, diazepam, morphine injection, ondansetron **OR** ondansetron (ZOFRAN) IV, ondansetron (ZOFRAN) IV, oxyCODONE, sodium chloride flush, sodium chloride flush   Vital Signs    Vitals:   01/16/23 2016 01/17/23 0100 01/17/23 0500 01/17/23 0738  BP: (!) 152/68 131/60 (!) 155/74 (!) 148/60  Pulse: 82 70 69 78  Resp: 20 18 18 18   Temp: 98.1 F (36.7 C) 98.5 F (36.9 C) 98.1 F (36.7 C) 97.6 F (36.4 C)  TempSrc: Oral Oral Oral Oral  SpO2: 95% 96% 100% 96%  Weight:   128.2 kg   Height:        Intake/Output Summary (Last 24 hours) at 01/17/2023 1032 Last data filed at 01/16/2023 2215 Gross per 24 hour  Intake 420 ml  Output --  Net 420 ml   Filed Weights   01/15/23 2028 01/17/23 0500  Weight: 133.6 kg 128.2 kg    Telemetry     Personally reviewed.  ECG    NSR  Physical Exam   GEN: No acute distress.   Neck: No JVD. Cardiac: RRR, no murmur, rub, or gallop.  Respiratory: Nonlabored. Clear to auscultation bilaterally. GI: Soft, nontender, bowel sounds present. MS: No  edema; No deformity. Neuro:  Nonfocal. Psych: Alert and oriented x 3. Normal affect.  Labs    Chemistry Recent Labs  Lab 01/15/23 2059 01/16/23 0259 01/16/23 1534 01/17/23 0140  NA 134* 135  --  132*  K 4.3 4.1  --  4.3  CL 100 101  --  102  CO2 25 27  --  23  GLUCOSE 238* 127*  --  166*  BUN 14 14  --  17  CREATININE 1.15 1.01 1.00 1.08  CALCIUM 8.6* 8.8*  --  8.5*  PROT  --  6.6  --   --   ALBUMIN  --  3.5  --   --   AST  --  20  --   --   ALT  --  27  --   --   ALKPHOS  --  85  --   --   BILITOT  --  0.9  --   --   GFRNONAA >60 >60 >60 >60  ANIONGAP 9 7  --  7     Hematology Recent Labs  Lab 01/16/23 0259 01/16/23 1534 01/17/23 0140  WBC 11.5* 12.6* 11.3*  RBC 4.88 4.86 4.81  HGB 13.3 13.2 13.2  HCT 41.3 40.1 39.9  MCV 84.6 82.5 83.0  MCH 27.3 27.2  27.4  MCHC 32.2 32.9 33.1  RDW 14.1 14.1 14.0  PLT 218 221 220    Cardiac Enzymes Recent Labs  Lab 01/15/23 2059 01/15/23 2234 01/16/23 0100 01/16/23 0259 01/16/23 0902  TROPONINIHS 13 23* 33* 40* 39*    BNPNo results for input(s): "BNP", "PROBNP" in the last 168 hours.   DDimerNo results for input(s): "DDIMER" in the last 168 hours.   Radiology    CARDIAC CATHETERIZATION  Result Date: 01/16/2023   Mid RCA lesion is 25% stenosed.   Prox LAD lesion is 80% stenosed.   Ost 1st Mrg to 1st Mrg lesion is 20% stenosed.   Dist RCA lesion is 40% stenosed.   Mid Cx lesion is 20% stenosed.   Previously placed Prox Cx stent of unknown type is  widely patent.   Scoring balloon angioplasty was performed using a BALL SAPPHIRE NC24 [id number].   Post intervention, there is a 5% residual stenosis.   The left ventricular systolic function is normal.   LV end diastolic pressure is low. Multivessel CAD with the previously placed proximal LAD stent with diffuse 80% in-stent restenosis. Left circumflex coronary artery has previously placed proximal and OM1 stents with mild 15% narrowing within the OM stent.  There is mid AV  groove 20% stenosis. The RCA has previously placed mid stent with 20% smooth in-stent narrowing.  There is 40% distal smooth RCA stenosis. Relatively preserved global LV contractility with LVEDP low at 9 mmHg.  EF estimate approximately 55%. Successful percutaneous coronary intervention with [place] cutting balloon atherotomy with noncompliant dilatation with a 3.75 x 15 mm noncompliant balloon.  The 80% stenosis was reduced to less than 5%. RECOMMENDATION: DAPT with aspirin/Brilinta for minimum of 12 months with probable long-term treatment.  Aggressive lipid-lowering therapy with target LDL less than 55.  Weight loss and optimal blood pressure control are indicated.   ECHOCARDIOGRAM COMPLETE  Result Date: 01/16/2023    ECHOCARDIOGRAM REPORT   Patient Name:   Lance [name] Date of Exam: 01/16/2023 Medical Rec #:  [id number]     Height:       71.0 in Accession #:    [id number]    Weight:       294.5 lb Date of Birth:  1976-08-06      BSA:          2.486 m Patient Age:    46 years      BP:           133/64 mmHg Patient Gender: M             HR:           69 bpm. [facility] Procedure: 2D Echo, Cardiac Doppler and Color Doppler Indications:    Chest Pain R07.9  History:        Patient has prior history of Echocardiogram examinations, most                 recent 07/26/2019. Cardiomyopathy, CAD; Risk                 Factors:Hypertension, Diabetes and Dyslipidemia. S/P drug                 eluting coronary stent placement, Hx of                 Acute ST elevation myocardial infarction (STEMI) involving left                 main coronary  artery. Chronic anticoagulation (Plavix). Morbid                 obesity.  Sonographer:    Celesta Gentile RCS Referring Phys: [id number] ASIA B ZIERLE-GHOSH IMPRESSIONS  1. The very distal apex is dyskinetic. Marland Kitchen Left ventricular ejection fraction, by estimation, is 60 to 65%. The left ventricle has normal function. The left ventricle demonstrates regional wall motion  abnormalities (see scoring diagram/findings for description). Left ventricular diastolic parameters are consistent with Grade I diastolic dysfunction (impaired relaxation). Elevated left atrial pressure.  2. Right ventricular systolic function is normal. The right ventricular size is normal.  3. The mitral valve is normal in structure. No evidence of mitral valve regurgitation. No evidence of mitral stenosis.  4. The aortic valve is tricuspid. There is mild calcification of the aortic valve. There is mild thickening of the aortic valve. Aortic valve regurgitation is not visualized. No aortic stenosis is present.  5. The inferior vena cava is normal in size with greater than 50% respiratory variability, suggesting right atrial pressure of 3 mmHg. FINDINGS  Left Ventricle: The very distal apex is dyskinetic. Left ventricular ejection fraction, by estimation, is 60 to 65%. The left ventricle has normal function. The left ventricle demonstrates regional wall motion abnormalities. Definity contrast agent was given IV to delineate the left ventricular endocardial borders. The left ventricular internal cavity size was normal in size. There is no left ventricular hypertrophy. Left ventricular diastolic parameters are consistent with Grade I diastolic dysfunction (impaired relaxation). Elevated left atrial pressure. Right Ventricle: The right ventricular size is normal. Right vetricular wall thickness was not well visualized. Right ventricular systolic function is normal. Left Atrium: Left atrial size was normal in size. Right Atrium: Right atrial size was normal in size. Pericardium: There is no evidence of pericardial effusion. Mitral Valve: The mitral valve is normal in structure. No evidence of mitral valve regurgitation. No evidence of mitral valve stenosis. MV peak gradient, 8.8 mmHg. The mean mitral valve gradient is 3.0 mmHg. Tricuspid Valve: The tricuspid valve is normal in structure. Tricuspid valve regurgitation  is not demonstrated. No evidence of tricuspid stenosis. Aortic Valve: The aortic valve is tricuspid. There is mild calcification of the aortic valve. There is mild thickening of the aortic valve. There is mild aortic valve annular calcification. Aortic valve regurgitation is not visualized. No aortic stenosis  is present. Aortic valve mean gradient measures 9.0 mmHg. Aortic valve peak gradient measures 17.8 mmHg. Aortic valve area, by VTI measures 1.60 cm. Pulmonic Valve: The pulmonic valve was not well visualized. Pulmonic valve regurgitation is not visualized. No evidence of pulmonic stenosis. Aorta: The aortic root is normal in size and structure. Venous: The inferior vena cava is normal in size with greater than 50% respiratory variability, suggesting right atrial pressure of 3 mmHg. IAS/Shunts: No atrial level shunt detected by color flow Doppler.  LEFT VENTRICLE PLAX 2D LVIDd:         4.80 cm   Diastology LVIDs:         3.10 cm   LV e' medial:    5.66 cm/s LV PW:         1.10 cm   LV E/e' medial:  22.8 LV IVS:        1.00 cm   LV e' lateral:   4.46 cm/s LVOT diam:     2.20 cm   LV E/e' lateral: 28.9 LV SV:         71 LV SV Index:  28 LVOT Area:     3.80 cm  RIGHT VENTRICLE RV S prime:     13.80 cm/s TAPSE (M-mode): 2.1 cm LEFT ATRIUM             Index        RIGHT ATRIUM           Index LA diam:        4.10 cm 1.65 cm/m   RA Area:     13.20 cm LA Vol (A2C):   57.2 ml 23.01 ml/m  RA Volume:   31.40 ml  12.63 ml/m LA Vol (A4C):   84.4 ml 33.96 ml/m LA Biplane Vol: 73.5 ml 29.57 ml/m  AORTIC VALVE AV Area (Vmax):    1.67 cm AV Area (Vmean):   1.81 cm AV Area (VTI):     1.60 cm AV Vmax:           211.00 cm/s AV Vmean:          133.000 cm/s AV VTI:            0.442 m AV Peak Grad:      17.8 mmHg AV Mean Grad:      9.0 mmHg LVOT Vmax:         92.50 cm/s LVOT Vmean:        63.200 cm/s LVOT VTI:          0.186 m LVOT/AV VTI ratio: 0.42  AORTA Ao Root diam: 3.10 cm MITRAL VALVE MV Area (PHT): 1.83 cm      SHUNTS MV Area VTI:   1.22 cm     Systemic VTI:  0.19 m MV Peak grad:  8.8 mmHg     Systemic Diam: 2.20 cm MV Mean grad:  3.0 mmHg MV Vmax:       1.48 m/s MV Vmean:      79.0 cm/s MV Decel Time: 415 msec MV E velocity: 129.00 cm/s MV A velocity: 153.00 cm/s MV E/A ratio:  0.84 Dina Rich MD Electronically signed by Dina Rich MD Signature Date/Time: 01/16/2023/12:19:27 PM    Final    DG Chest 2 View  Result Date: 01/15/2023 CLINICAL DATA:  Chest and epigastric pain. EXAM: CHEST - 2 VIEW COMPARISON:  03/09/2021. FINDINGS: Heart is enlarged and the mediastinal contour is within normal limits. There is mild atelectasis at the right lung base no consolidation, effusion, or pneumothorax. Mild degenerative changes are noted in the thoracic spine. Surgical clips are present in the upper abdomen. IMPRESSION: Mild atelectasis at the right lung base. Electronically Signed   By: Thornell Sartorius M.D.   On: 01/15/2023 21:18     Assessment & Plan    Patient is a 47 year old M known to have CAD s/p RCA and OM PCI in 2018, out-of-hospital V-fib arrest with STEMI in 2020 s/p LAD PCI and staged angioplasty to restenosed OM, SVT ablation in 2022, HTN, DM 2, HLD, nicotine abuse, HF imp EF is currently admitted to hospitalist team for the management of unstable angina.  # Unstable angina s/p LAD ISR PCI with patent RCA and LCx/OM stents # CAD s/p RCA and OM PCI in 2018, LAD and angioplasty to OM ISR in 2020 -Continue DAPT for total duration of 1 year, aspirin 81 mg once daily with Brilinta 90 mg twice daily (home dose of Brilinta 60 mg twice daily will need to be discontinued upon discharge). All elective procedures will need to be postponed at least for 1 year. Right radial access site is  intact with no swelling/bleeding or oozing. Radial access site instructions provided. -Continue high intensity statin, atorvastatin 80 mg nightly -Continue lisinopril 40 mg once daily -Echo from this admission showed normal  LVEF -Outpatient cardiac rehab -Patient stable for discharge to home today  # HLD, at goal: Continue atorvastatin 80 mg nightly, goal LDL less than 55.  LDL around 3 months ago was 59. # Nicotine abuse: Currently smokes from 5-30 cigarettes per day, patient is not interested to quit smoking ever in his life despite knowing that smoking is a risk factor for multiple heart attacks before.  I have spent a total of 30 minutes with patient reviewing chart, telemetry, EKGs, labs and examining patient as well as establishing an assessment and plan that was discussed with the patient.  > 50% of time was spent in direct patient care.        Signed, Marjo Bicker, MD  01/17/2023, 10:32 AM

## 2023-01-17 NOTE — Plan of Care (Signed)
  Problem: Education: Goal: Ability to describe self-care measures that may prevent or decrease complications (Diabetes Survival Skills Education) will improve Outcome: Adequate for Discharge Goal: Individualized Educational Video(s) Outcome: Adequate for Discharge   Problem: Coping: Goal: Ability to adjust to condition or change in health will improve Outcome: Adequate for Discharge   Problem: Fluid Volume: Goal: Ability to maintain a balanced intake and output will improve Outcome: Adequate for Discharge   Problem: Health Behavior/Discharge Planning: Goal: Ability to identify and utilize available resources and services will improve Outcome: Adequate for Discharge Goal: Ability to manage health-related needs will improve Outcome: Adequate for Discharge   Problem: Metabolic: Goal: Ability to maintain appropriate glucose levels will improve Outcome: Adequate for Discharge   Problem: Nutritional: Goal: Maintenance of adequate nutrition will improve Outcome: Adequate for Discharge Goal: Progress toward achieving an optimal weight will improve Outcome: Adequate for Discharge   Problem: Skin Integrity: Goal: Risk for impaired skin integrity will decrease Outcome: Adequate for Discharge   Problem: Tissue Perfusion: Goal: Adequacy of tissue perfusion will improve Outcome: Adequate for Discharge   Problem: Education: Goal: Understanding of CV disease, CV risk reduction, and recovery process will improve Outcome: Adequate for Discharge Goal: Individualized Educational Video(s) Outcome: Adequate for Discharge   Problem: Activity: Goal: Ability to return to baseline activity level will improve Outcome: Adequate for Discharge   Problem: Cardiovascular: Goal: Ability to achieve and maintain adequate cardiovascular perfusion will improve Outcome: Adequate for Discharge Goal: Vascular access site(s) Level 0-1 will be maintained Outcome: Adequate for Discharge   Problem: Health  Behavior/Discharge Planning: Goal: Ability to safely manage health-related needs after discharge will improve Outcome: Adequate for Discharge   Problem: Education: Goal: Knowledge of General Education information will improve Description: Including pain rating scale, medication(s)/side effects and non-pharmacologic comfort measures Outcome: Adequate for Discharge   Problem: Health Behavior/Discharge Planning: Goal: Ability to manage health-related needs will improve Outcome: Adequate for Discharge   Problem: Clinical Measurements: Goal: Ability to maintain clinical measurements within normal limits will improve Outcome: Adequate for Discharge Goal: Will remain free from infection Outcome: Adequate for Discharge Goal: Diagnostic test results will improve Outcome: Adequate for Discharge Goal: Respiratory complications will improve Outcome: Adequate for Discharge Goal: Cardiovascular complication will be avoided Outcome: Adequate for Discharge   Problem: Activity: Goal: Risk for activity intolerance will decrease Outcome: Adequate for Discharge   Problem: Nutrition: Goal: Adequate nutrition will be maintained Outcome: Adequate for Discharge   Problem: Coping: Goal: Level of anxiety will decrease Outcome: Adequate for Discharge   Problem: Elimination: Goal: Will not experience complications related to bowel motility Outcome: Adequate for Discharge Goal: Will not experience complications related to urinary retention Outcome: Adequate for Discharge   Problem: Pain Managment: Goal: General experience of comfort will improve Outcome: Adequate for Discharge   Problem: Safety: Goal: Ability to remain free from injury will improve Outcome: Adequate for Discharge   Problem: Skin Integrity: Goal: Risk for impaired skin integrity will decrease Outcome: Adequate for Discharge   

## 2023-01-17 NOTE — Progress Notes (Signed)
CARDIAC REHAB PHASE I   PRE:  Rate/Rhythm: 79/ NSR  BP:  Sitting: 148/60      SaO2: 96  MODE:  Ambulation: 470 ft   POST:  Rate/Rhythm: 84/ NSR  BP:  Sitting: 133/64      SaO2: 100  Pt tolerated exercise well and amb 470 ft independently. Pt denies CP, SOB, or dizziness throughout walk. Pt placed back in bed with call bell in reach after walk. Education on smoking cessation, heart healthy diabetic diet, exercise guidelines, risk factors, NTG use, CRPII and antiplatelet medication done. Pt referred to [facility]. Will continue to follow.  [id number] Guss Bunde, RRT, BSRT 01/17/2023 9:12 AM

## 2023-01-17 NOTE — Plan of Care (Signed)
  Problem: Activity: Goal: Ability to return to baseline activity level will improve Outcome: Progressing   Problem: Education: Goal: Knowledge of General Education information will improve Description: Including pain rating scale, medication(s)/side effects and non-pharmacologic comfort measures Outcome: Progressing   

## 2023-01-17 NOTE — Discharge Instructions (Addendum)
Medication Changes: - STOP Brilinta 60mg  twice daily and START Brilinta 90mg  twice daily instead. Take this in addition to your Aspirin 81mg  daily. These medications are very important in helping keep the new area that we ballooned in your heart open. - Continue all other home medications as described elsewhere on discharge paperwork.  Post Cardiac Catheterization: NO HEAVY LIFTING OR SEXUAL ACTIVITY X 7 DAYS. NO DRIVING X 3-5 DAYS. NO SOAKING BATHS, HOT TUBS, POOLS, ETC., X 7 DAYS.  Radial Site Care: Refer to this sheet in the next few weeks. These instructions provide you with information on caring for yourself after your procedure. Your caregiver may also give you more specific instructions. Your treatment has been planned according to current medical practices, but problems sometimes occur. Call your caregiver if you have any problems or questions after your procedure. HOME CARE INSTRUCTIONS You may shower the day after the procedure. Remove the bandage (dressing) and gently wash the site with plain soap and water. Gently pat the site dry.  Do not apply powder or lotion to the site.  Do not submerge the affected site in water for 3 to 5 days.  Inspect the site at least twice daily.  Do not flex or bend the affected arm for 24 hours.  No lifting over 5 pounds (2.3 kg) for 5 days after your procedure.  Do not drive home if you are discharged the same day of the procedure. Have someone else drive you.  What to expect: Any bruising will usually fade within 1 to 2 weeks.  Blood that collects in the tissue (hematoma) may be painful to the touch. It should usually decrease in size and tenderness within 1 to 2 weeks.  SEEK IMMEDIATE MEDICAL CARE IF: You have unusual pain at the radial site.  You have redness, warmth, swelling, or pain at the radial site.  You have drainage (other than a small amount of blood on the dressing).  You have chills.  You have a fever or persistent symptoms for more  than 72 hours.  You have a fever and your symptoms suddenly get worse.  Your arm becomes pale, cool, tingly, or numb.  You have heavy bleeding from the site. Hold pressure on the site.

## 2023-01-17 NOTE — Discharge Summary (Addendum)
Discharge Summary    Patient ID: Lance [name] MRN: [id number]; DOB: [birth year]  Admit date: 01/15/2023 Discharge date: 01/17/2023  PCP:  Lianne Moris, PA-C   [facility] Providers Cardiologist:  Nona Dell, MD   {  Discharge Diagnoses    Principal Problem:   Unstable angina Active Problems:   CAD (coronary artery disease)   Chronic HFimpEF   SVT (supraventricular tachycardia)   Essential hypertension, benign   Mixed hyperlipidemia   Tobacco abuse   Type 2 diabetes mellitus with complication, with long-term current use of insulin   Chest pain   Obesity (BMI 30-39.9)    Diagnostic Studies/Procedures    Echocardiogram 01/16/2023: Impressions:  1. The very distal apex is dyskinetic. Marland Kitchen Left ventricular ejection  fraction, by estimation, is 60 to 65%. The left ventricle has normal  function. The left ventricle demonstrates regional wall motion  abnormalities (see scoring diagram/findings for  description). Left ventricular diastolic parameters are consistent with  Grade I diastolic dysfunction (impaired relaxation). Elevated left atrial  pressure.   2. Right ventricular systolic function is normal. The right ventricular  size is normal.   3. The mitral valve is normal in structure. No evidence of mitral valve  regurgitation. No evidence of mitral stenosis.   4. The aortic valve is tricuspid. There is mild calcification of the  aortic valve. There is mild thickening of the aortic valve. Aortic valve  regurgitation is not visualized. No aortic stenosis is present.   5. The inferior vena cava is normal in size with greater than 50%  respiratory variability, suggesting right atrial pressure of 3 mmHg.  _____________  Left Cardiac Catheterization 01/16/2023:   Mid RCA lesion is 25% stenosed.   Prox LAD lesion is 80% stenosed.   Ost 1st Mrg to 1st Mrg lesion is 20% stenosed.   Dist RCA lesion is 40% stenosed.   Mid Cx lesion is 20% stenosed.   Previously  placed Prox Cx stent of unknown type is  widely patent.   Scoring balloon angioplasty was performed using a BALL SAPPHIRE NC24 3.75X15.   Post intervention, there is a 5% residual stenosis.   The left ventricular systolic function is normal.   LV end diastolic pressure is low.   Multivessel CAD with the previously placed proximal LAD stent with diffuse 80% in-stent restenosis.   Left circumflex coronary artery has previously placed proximal and OM1 stents with mild 15% narrowing within the OM stent.  There is mid AV groove 20% stenosis.   The RCA has previously placed mid stent with 20% smooth in-stent narrowing.  There is 40% distal smooth RCA stenosis.   Relatively preserved global LV contractility with LVEDP low at 9 mmHg.  EF estimate approximately 55%.   Successful percutaneous coronary intervention with [place] cutting balloon atherotomy with noncompliant dilatation with a 3.75 x 15 mm noncompliant balloon.  The 80% stenosis was reduced to less than 5%.   Recommendation: DAPT with aspirin/Brilinta for minimum of 12 months with probable long-term treatment.  Aggressive lipid-lowering therapy with target LDL less than 55.  Weight loss and optimal blood pressure control are indicated.  Diagnostic Dominance: Right  Intervention        History of Present Illness     Lance [name] is a 47 y.o. male with a history of CAD s/p multiple PCIs (DES to RCA and OM in 07/2017 and DES to LAD and staged PTCA to re-stenosed OM in 03/2019 in setting of STEMI complicated by VF  arrest), chronic HFimpEF with EF of 40-45% in 03/2019 at time of STEMI but later normalized to 65-70% on Echo in 07/2019, SVT s/p ablation in 04/221, hypertension, hyperlipidemia, type 2 diabetes mellitus, and tobacco abuse who  presented to [facility] ED on 01/15/2023 for evaluation of chest/ epigastric pain. Pain started approximately 1 hour prior to arrival. Patient works in [employer] and reported more fatigue over the  past few weeks. However, he denied any exertional chest pain or dyspnea. He developed substernal chest discomfort on the evening of 01/15/2023 after eating chicken pot pot pie. He reports he had the pain and then had 1 episode of vomiting. He has known acid reflux but reported the symptoms felt different than his usual GERD. The pain persistent for over 1 hour which is when he decided to go to the ED.   Work-up in the ED: High-sensitivity troponin mildly elevated at 13 >> 23 >> 33 >> 40. EKG showed normal sinus rhythm, rate 94 bpm, with down sloping ST segments along inferior leads but similar to prior tracings. WBC 11.9, Hgb 13.0, Plts 210. Na 134, K 4.3, Cr 1.15. AST 20 and ALT 27. Lipase normal at 34. He was admitted to [facility] and seen by our Cardiology team there who recommended transfer to [facility] for cardiac catheterization.   Hospital Course     Consultants: None  Unstable Angina CAD  Patient was transferred from [facility] to [facility] [place] on 01/16/2023 for chest pain concerning for unstable angina. Echo showed LVEF of 60-65% with dyskinesis of very distal apex and grade 1 diastolic dysfunction. LHC showed multivessel CAD with diffuse 80% in-stent restenosis of prior proximal LAD stent with otherwise only mild disease. He underwent successful PCI with [facility] cutting balloon atherotomy and angioplasty of the LAD lesions and the 80% stenosis was reduced to <5%. He tolerated the procedure well. He denied any recurrent chest pain and right radial cath site stable. Plan is for DAPT with Aspirin 81mg  daily and Brilinta 90mg  twice daily. He was previously on Brilinta 60mg  twice daily at home so this was stopped and he was advised to start 90mg  twice daily instead. Continue home Lipitor 80mg  daily and Lisinopril 40mg  daily.  He was not started on a beta-blocker due to some bradycardia.   Chronic HFimpEF LVEF as low as 40-45% in 03/2019 at the time of his STEMI but later normalized. Echo this  admission showed LVEF of 60-65% with dyskinesis of very distal apex and grade 1 diastolic dysfunction. No evidence of volume overload this admission. Continue home Lisinopril 40mg  daily. No beta-blocker given bradycardia per consult note on 01/16/2023.  SVT s/p Ablation S/p ablation in 04/2021. No issues this admission.   Hypertension Continue home Lisinopril 40mg  daily.  Hyperlipidemia Lipid panel was not checked this admission but lipid panel in 09/2022 showed Total Cholesterol 111, Triglycerides 197, HDL 19, LDL 59. LDL goal <55. Continue Lipitor 80mg  daily. Consider rechecking lipid panel as an outpatient and starting Zetia 10mg  daily if LDL still above goal.  Type 2 Diabetes Mellitus  Hemoglobin A1c 7.1%. Placed on sliding scale insulin during admission. Can resume home regimen at discharge.  Tobacco Abuse Patient currently smoke between 5 to 30 cigarettes per day. He was counseled on the importance of smoking cessation but is not interested in quitting.   Outpatient follow-up arranged. Medication as below. He works in [employer] so I will write him out of work until he can be seen in our office for follow-up on  01/26/2023.                     Did the patient have a percutaneous coronary intervention (stent / angioplasty)?:  Yes.     Cath/PCI Registry Performance & Quality Measures: Aspirin prescribed? - Yes ADP Receptor Inhibitor (Plavix/Clopidogrel, Brilinta/Ticagrelor or Effient/Prasugrel) prescribed (includes medically managed patients)? - Yes High Intensity Statin (Lipitor 40-80mg  or Crestor 20-40mg ) prescribed? - Yes For EF <40%, was ACEI/ARB prescribed? - Not Applicable (EF >/= 40%) For EF <40%, Aldosterone Antagonist (Spironolactone or Eplerenone) prescribed? - Not Applicable (EF >/= 40%) Cardiac Rehab Phase II ordered? - Yes   _____________  Discharge Vitals Blood pressure 136/64, pulse 73, temperature 97.6 F (36.4 C), temperature source Oral, resp. rate 20, height 5'  11" (1.803 m), weight 128.2 kg, SpO2 99 %.  Filed Weights   01/15/23 2028 01/17/23 0500  Weight: 133.6 kg 128.2 kg    Labs & Radiologic Studies    CBC Recent Labs    01/16/23 0259 01/16/23 1534 01/17/23 0140  WBC 11.5* 12.6* 11.3*  NEUTROABS 6.7  --   --   HGB 13.3 13.2 13.2  HCT 41.3 40.1 39.9  MCV 84.6 82.5 83.0  PLT 218 221 220   Basic Metabolic Panel Recent Labs    40/10/27 0259 01/16/23 1534 01/17/23 0140  NA 135  --  132*  K 4.1  --  4.3  CL 101  --  102  CO2 27  --  23  GLUCOSE 127*  --  166*  BUN 14  --  17  CREATININE 1.01 1.00 1.08  CALCIUM 8.8*  --  8.5*  MG 2.2  --   --    Liver Function Tests Recent Labs    01/16/23 0259  AST 20  ALT 27  ALKPHOS 85  BILITOT 0.9  PROT 6.6  ALBUMIN 3.5   Recent Labs    01/15/23 2059  LIPASE 34   High Sensitivity Troponin:   Recent Labs  Lab 01/15/23 2059 01/15/23 2234 01/16/23 0100 01/16/23 0259 01/16/23 0902  TROPONINIHS 13 23* 33* 40* 39*    BNP Invalid input(s): "POCBNP" D-Dimer No results for input(s): "DDIMER" in the last 72 hours. Hemoglobin A1C Recent Labs    01/16/23 0259  HGBA1C 7.1*   Fasting Lipid Panel No results for input(s): "CHOL", "HDL", "LDLCALC", "TRIG", "CHOLHDL", "LDLDIRECT" in the last 72 hours. Thyroid Function Tests No results for input(s): "TSH", "T4TOTAL", "T3FREE", "THYROIDAB" in the last 72 hours.  Invalid input(s): "FREET3" _____________  CARDIAC CATHETERIZATION  Result Date: 01/16/2023   Mid RCA lesion is 25% stenosed.   Prox LAD lesion is 80% stenosed.   Ost 1st Mrg to 1st Mrg lesion is 20% stenosed.   Dist RCA lesion is 40% stenosed.   Mid Cx lesion is 20% stenosed.   Previously placed Prox Cx stent of unknown type is  widely patent.   Scoring balloon angioplasty was performed using a BALL SAPPHIRE NC24 3.75X15.   Post intervention, there is a 5% residual stenosis.   The left ventricular systolic function is normal.   LV end diastolic pressure is low.  Multivessel CAD with the previously placed proximal LAD stent with diffuse 80% in-stent restenosis. Left circumflex coronary artery has previously placed proximal and OM1 stents with mild 15% narrowing within the OM stent.  There is mid AV groove 20% stenosis. The RCA has previously placed mid stent with 20% smooth in-stent narrowing.  There is 40% distal smooth RCA stenosis. Relatively  preserved global LV contractility with LVEDP low at 9 mmHg.  EF estimate approximately 55%. Successful percutaneous coronary intervention with [place] cutting balloon atherotomy with noncompliant dilatation with a 3.75 x 15 mm noncompliant balloon.  The 80% stenosis was reduced to less than 5%. RECOMMENDATION: DAPT with aspirin/Brilinta for minimum of 12 months with probable long-term treatment.  Aggressive lipid-lowering therapy with target LDL less than 55.  Weight loss and optimal blood pressure control are indicated.   ECHOCARDIOGRAM COMPLETE  Result Date: 01/16/2023    ECHOCARDIOGRAM REPORT   Patient Name:   Lance [name] Date of Exam: 01/16/2023 Medical Rec #:  [id number]     Height:       71.0 in Accession #:    [id number]    Weight:       294.5 lb Date of Birth:  Mar 15, 1976      BSA:          2.486 m Patient Age:    46 years      BP:           133/64 mmHg Patient Gender: M             HR:           69 bpm. [facility] Procedure: 2D Echo, Cardiac Doppler and Color Doppler Indications:    Chest Pain R07.9  History:        Patient has prior history of Echocardiogram examinations, most                 recent 07/26/2019. Cardiomyopathy, CAD; Risk                 Factors:Hypertension, Diabetes and Dyslipidemia. S/P drug                 eluting coronary stent placement, Hx of                 Acute ST elevation myocardial infarction (STEMI) involving left                 main coronary artery. Chronic anticoagulation (Plavix). Morbid                 obesity.  Sonographer:    Celesta Gentile RCS Referring Phys: [id number]  ASIA B ZIERLE-GHOSH IMPRESSIONS  1. The very distal apex is dyskinetic. Marland Kitchen Left ventricular ejection fraction, by estimation, is 60 to 65%. The left ventricle has normal function. The left ventricle demonstrates regional wall motion abnormalities (see scoring diagram/findings for description). Left ventricular diastolic parameters are consistent with Grade I diastolic dysfunction (impaired relaxation). Elevated left atrial pressure.  2. Right ventricular systolic function is normal. The right ventricular size is normal.  3. The mitral valve is normal in structure. No evidence of mitral valve regurgitation. No evidence of mitral stenosis.  4. The aortic valve is tricuspid. There is mild calcification of the aortic valve. There is mild thickening of the aortic valve. Aortic valve regurgitation is not visualized. No aortic stenosis is present.  5. The inferior vena cava is normal in size with greater than 50% respiratory variability, suggesting right atrial pressure of 3 mmHg. FINDINGS  Left Ventricle: The very distal apex is dyskinetic. Left ventricular ejection fraction, by estimation, is 60 to 65%. The left ventricle has normal function. The left ventricle demonstrates regional wall motion abnormalities. Definity contrast agent was given IV to delineate the left ventricular endocardial borders. The left ventricular internal cavity size was normal in size.  There is no left ventricular hypertrophy. Left ventricular diastolic parameters are consistent with Grade I diastolic dysfunction (impaired relaxation). Elevated left atrial pressure. Right Ventricle: The right ventricular size is normal. Right vetricular wall thickness was not well visualized. Right ventricular systolic function is normal. Left Atrium: Left atrial size was normal in size. Right Atrium: Right atrial size was normal in size. Pericardium: There is no evidence of pericardial effusion. Mitral Valve: The mitral valve is normal in structure. No evidence  of mitral valve regurgitation. No evidence of mitral valve stenosis. MV peak gradient, 8.8 mmHg. The mean mitral valve gradient is 3.0 mmHg. Tricuspid Valve: The tricuspid valve is normal in structure. Tricuspid valve regurgitation is not demonstrated. No evidence of tricuspid stenosis. Aortic Valve: The aortic valve is tricuspid. There is mild calcification of the aortic valve. There is mild thickening of the aortic valve. There is mild aortic valve annular calcification. Aortic valve regurgitation is not visualized. No aortic stenosis  is present. Aortic valve mean gradient measures 9.0 mmHg. Aortic valve peak gradient measures 17.8 mmHg. Aortic valve area, by VTI measures 1.60 cm. Pulmonic Valve: The pulmonic valve was not well visualized. Pulmonic valve regurgitation is not visualized. No evidence of pulmonic stenosis. Aorta: The aortic root is normal in size and structure. Venous: The inferior vena cava is normal in size with greater than 50% respiratory variability, suggesting right atrial pressure of 3 mmHg. IAS/Shunts: No atrial level shunt detected by color flow Doppler.  LEFT VENTRICLE PLAX 2D LVIDd:         4.80 cm   Diastology LVIDs:         3.10 cm   LV e' medial:    5.66 cm/s LV PW:         1.10 cm   LV E/e' medial:  22.8 LV IVS:        1.00 cm   LV e' lateral:   4.46 cm/s LVOT diam:     2.20 cm   LV E/e' lateral: 28.9 LV SV:         71 LV SV Index:   28 LVOT Area:     3.80 cm  RIGHT VENTRICLE RV S prime:     13.80 cm/s TAPSE (M-mode): 2.1 cm LEFT ATRIUM             Index        RIGHT ATRIUM           Index LA diam:        4.10 cm 1.65 cm/m   RA Area:     13.20 cm LA Vol (A2C):   57.2 ml 23.01 ml/m  RA Volume:   31.40 ml  12.63 ml/m LA Vol (A4C):   84.4 ml 33.96 ml/m LA Biplane Vol: 73.5 ml 29.57 ml/m  AORTIC VALVE AV Area (Vmax):    1.67 cm AV Area (Vmean):   1.81 cm AV Area (VTI):     1.60 cm AV Vmax:           211.00 cm/s AV Vmean:          133.000 cm/s AV VTI:            0.442 m AV Peak  Grad:      17.8 mmHg AV Mean Grad:      9.0 mmHg LVOT Vmax:         92.50 cm/s LVOT Vmean:        63.200 cm/s LVOT VTI:  0.186 m LVOT/AV VTI ratio: 0.42  AORTA Ao Root diam: 3.10 cm MITRAL VALVE MV Area (PHT): 1.83 cm     SHUNTS MV Area VTI:   1.22 cm     Systemic VTI:  0.19 m MV Peak grad:  8.8 mmHg     Systemic Diam: 2.20 cm MV Mean grad:  3.0 mmHg MV Vmax:       1.48 m/s MV Vmean:      79.0 cm/s MV Decel Time: 415 msec MV E velocity: 129.00 cm/s MV A velocity: 153.00 cm/s MV E/A ratio:  0.84 Dina Rich MD Electronically signed by Dina Rich MD Signature Date/Time: 01/16/2023/12:19:27 PM    Final    DG Chest 2 View  Result Date: 01/15/2023 CLINICAL DATA:  Chest and epigastric pain. EXAM: CHEST - 2 VIEW COMPARISON:  03/09/2021. FINDINGS: Heart is enlarged and the mediastinal contour is within normal limits. There is mild atelectasis at the right lung base no consolidation, effusion, or pneumothorax. Mild degenerative changes are noted in the thoracic spine. Surgical clips are present in the upper abdomen. IMPRESSION: Mild atelectasis at the right lung base. Electronically Signed   By: Thornell Sartorius M.D.   On: 01/15/2023 21:18   Disposition   Patient is being discharged home today in good condition.  Follow-up Plans & Appointments     Follow-up Information     Sharlene Dory, NP Follow up.   Specialty: Cardiology Why: Hospital follow-up with Cardiology scheduled for 01/26/2023 at 10am in our [place] office. Please arrive 15 minutes early for check-in. If this date/time does not work for you, please call our office to reschedule. Contact information: [address] [place] [address] [phone]                Discharge Instructions     Amb Referral to Cardiac Rehabilitation   Complete by: As directed    Diagnosis: PTCA   After initial evaluation and assessments completed: Virtual Based Care may be provided alone or in conjunction with Phase 2 Cardiac Rehab  based on patient barriers.: Yes   Intensive Cardiac Rehabilitation (ICR) [facility] location only OR Traditional Cardiac Rehabilitation (TCR) *If criteria for ICR are not met will enroll in TCR [facility] only): Yes   Diet - low sodium heart healthy   Complete by: As directed    Increase activity slowly   Complete by: As directed         Discharge Medications   Allergies as of 01/17/2023       Reactions   Bee Venom Anaphylaxis, Swelling   Penicillins Hives, Itching, Rash   Has patient had a PCN reaction causing immediate rash, facial/tongue/throat swelling, SOB or lightheadedness with hypotension: No Has patient had a PCN reaction causing severe rash involving mucus membranes or skin necrosis: No Has patient had a PCN reaction that required hospitalization: No Has patient had a PCN reaction occurring within the last 10 years: No If all of the above answers are "NO", then may proceed with Cephalosporin use. Has patient had a PCN reaction causing immediate rash, facial/tongue/throat swelling, SOB or lightheadedness with hypotension: Yes, Has patient had a PCN reaction causing severe rash involving mucus membranes or skin necrosis: No, Has patient had a PCN reaction that required hospitalization: No, Has patient had a PCN reaction occurring within the last 10 years: No, If all of the above answers are "NO", then may proceed with Cephalosporin use.        Medication List  TAKE these medications    ALPRAZolam 1 MG tablet Commonly known as: XANAX Take 1 mg by mouth daily as needed for anxiety.   aspirin EC 81 MG tablet Take 1 tablet (81 mg total) by mouth daily with breakfast.   atorvastatin 80 MG tablet Commonly known as: LIPITOR TAKE 1 TABLET BY MOUTH EVERYDAY AT BEDTIME   B-D ULTRAFINE III SHORT PEN 31G X 8 MM Misc Generic drug: Insulin Pen Needle USE TO INJECT INSULIN 4 (FOUR) TIMES DAILY.   blood glucose meter kit and supplies Kit 1 each by Does not apply route 4 (four) times  daily. Dispense based on patient and insurance preference. Use up to four times daily as directed. (FOR ICD-9 250.00, 250.01).   Dexcom G7 Sensor Misc CHANGE SENSOR EVERY 10 DAYS   esomeprazole 20 MG capsule Commonly known as: NEXIUM Take 20 mg by mouth daily at 12 noon.   Lantus SoloStar 100 UNIT/ML Solostar Pen Generic drug: insulin glargine INJECT 70 UNITS INTO THE SKIN AT BEDTIME.   lisinopril 40 MG tablet Commonly known as: ZESTRIL TAKE 1 TABLET BY MOUTH EVERY DAY   nitroGLYCERIN 0.4 MG SL tablet Commonly known as: NITROSTAT Place 1 tablet (0.4 mg total) under the tongue every 5 (five) minutes x 3 doses as needed for chest pain.   NovoLOG FlexPen 100 UNIT/ML FlexPen Generic drug: insulin aspart Inject 16-22 Units into the skin 3 (three) times daily with meals. Sliding scale   Omega-3 1000 MG Caps Take 1,000 mg by mouth daily.   Ozempic (0.25 or 0.5 MG/DOSE) 2 MG/1.5ML Sopn Generic drug: Semaglutide(0.25 or 0.5MG /DOS) Inject 0.5 mg into the skin once a week.   ticagrelor 90 MG Tabs tablet Commonly known as: BRILINTA Take 1 tablet (90 mg total) by mouth 2 (two) times daily. What changed:  medication strength how much to take           Outstanding Labs/Studies   Consider repeat lipid panel at follow-up visit.  Duration of Discharge Encounter   Greater than 30 minutes including physician time.  Signed, Jaydyn Menon Verne Spurr, MD [facility]

## 2023-01-19 ENCOUNTER — Encounter (HOSPITAL_COMMUNITY): Payer: Self-pay | Admitting: Cardiovascular Disease

## 2023-01-20 LAB — LIPOPROTEIN A (LPA): Lipoprotein (a): 63.6 nmol/L — ABNORMAL HIGH (ref <75.0)

## 2023-01-26 ENCOUNTER — Encounter: Payer: Self-pay | Admitting: Nurse Practitioner

## 2023-01-26 ENCOUNTER — Ambulatory Visit: Payer: 59 | Attending: Nurse Practitioner | Admitting: Nurse Practitioner

## 2023-01-26 VITALS — BP 139/79 | HR 81 | Ht 71.0 in | Wt 291.8 lb

## 2023-01-26 DIAGNOSIS — Z79899 Other long term (current) drug therapy: Secondary | ICD-10-CM

## 2023-01-26 DIAGNOSIS — Z8679 Personal history of other diseases of the circulatory system: Secondary | ICD-10-CM

## 2023-01-26 DIAGNOSIS — E785 Hyperlipidemia, unspecified: Secondary | ICD-10-CM

## 2023-01-26 DIAGNOSIS — I10 Essential (primary) hypertension: Secondary | ICD-10-CM

## 2023-01-26 DIAGNOSIS — Z0279 Encounter for issue of other medical certificate: Secondary | ICD-10-CM

## 2023-01-26 DIAGNOSIS — Z720 Tobacco use: Secondary | ICD-10-CM

## 2023-01-26 DIAGNOSIS — I2510 Atherosclerotic heart disease of native coronary artery without angina pectoris: Secondary | ICD-10-CM | POA: Diagnosis not present

## 2023-01-26 MED ORDER — TICAGRELOR 90 MG PO TABS: 90.0000 mg | ORAL_TABLET | Freq: Two times a day (BID) | ORAL | 5 refills | Status: DC

## 2023-01-26 NOTE — Patient Instructions (Addendum)
Medication Instructions:  Your physician recommends that you continue on your current medications as directed. Please refer to the Current Medication list given to you today.   Labwork: CBC, BMET-due in 2 weeks (5/6) @ [facility])  Testing/Procedures: none  Follow-Up:  Your physician recommends that you schedule a follow-up appointment in: 6 months  Any Other Special Instructions Will Be Listed Below (If Applicable).  Nurse visit in 3-4 weeks to check blood pressure. Pleases bring your blood pressure cuff with you to that visit.  If you need a refill on your cardiac medications before your next appointment, please call your pharmacy.

## 2023-01-26 NOTE — Progress Notes (Unsigned)
Office Visit    Patient Name: Lance [name] Date of Encounter: 01/27/2023  PCP:  Dell Ponto   [facility]  Cardiologist:  Nona Dell, MD  Advanced Practice Provider:  No care team member to display Electrophysiologist:  None   Chief Complaint    Lance [name] is a 47 y.o. male with a hx of CAD, s/p PCI/DES to mRCA and OM, with total occlusion of dLCX, HTN, HLD, hx of SVT (converted with adenosine) T2DM, ischemic cardiomyopathy, tobacco abuse, and GERD, who presents today for hospital follow-up.    Past Medical History    Past Medical History:  Diagnosis Date   CAD (coronary artery disease), native coronary artery    10/18 PCI/DES to mRCA, and OM, with total occlusion of dLCx with collaterals.    Essential hypertension    GERD (gastroesophageal reflux disease)    History of kidney stones    Hyperlipemia    SVT (supraventricular tachycardia) 2015   Converted with adenosine   Type 2 diabetes mellitus    Past Surgical History:  Procedure Laterality Date   CARDIAC SURGERY     CORONARY ANGIOPLASTY WITH STENT PLACEMENT  07/15/2017   "2 stents"   CORONARY BALLOON ANGIOPLASTY N/A 01/16/2023   Procedure: CORONARY BALLOON ANGIOPLASTY;  Surgeon: Lennette Bihari, MD;  Location: [facility] INVASIVE CV [facility];  Service: Cardiovascular;  Laterality: N/A;   CORONARY/GRAFT ACUTE MI REVASCULARIZATION N/A 03/12/2019   Procedure: Coronary/Graft Acute MI Revascularization;  Surgeon: Yvonne Kendall, MD;  Location: [facility] IN[facility] CV [facility];  Service: Cardiovascular;  Laterality: N/A;   LAPAROSCOPIC APPENDECTOMY N/A 11/12/2017   Procedure: APPENDECTOMY LAPAROSCOPIC, REPAIR OF INCARCERATED INCISIONAL HERNIA;  Surgeon: Karie Soda, MD;  Location: [facility];  Service: General;  Laterality: N/A;   LAPAROSCOPIC CHOLECYSTECTOMY     LEFT HEART CATH AND CORONARY ANGIOGRAPHY N/A 07/15/2017   Procedure: LEFT HEART CATH AND CORONARY ANGIOGRAPHY;  Surgeon: Tonny Bollman, MD;  Location: [facility]  INVASIVE CV LAB;  Service: Cardiovascular;  Laterality: N/A;   LEFT HEART CATH AND CORONARY ANGIOGRAPHY N/A 03/12/2019   Procedure: LEFT HEART CATH AND CORONARY ANGIOGRAPHY;  Surgeon: Yvonne Kendall, MD;  Location: [facility];  Service: Cardiovascular;  Laterality: N/A;   LEFT HEART CATH AND CORONARY ANGIOGRAPHY N/A 01/16/2023   Procedure: LEFT HEART CATH AND CORONARY ANGIOGRAPHY;  Surgeon: Lennette Bihari, MD;  Location: [facility];  Service: Cardiovascular;  Laterality: N/A;   SVT ABLATION N/A 04/22/2021   Procedure: SVT ABLATION;  Surgeon: Marinus Maw, MD;  Location: [facility];  Service: Cardiovascular;  Laterality: N/A;    Allergies  Allergies  Allergen Reactions   Bee Venom Anaphylaxis and Swelling   Penicillins Hives, Itching and Rash    Has patient had a PCN reaction causing immediate rash, facial/tongue/throat swelling, SOB or lightheadedness with hypotension: No  Has patient had a PCN reaction causing severe rash involving mucus membranes or skin necrosis: No  Has patient had a PCN reaction that required hospitalization: No  Has patient had a PCN reaction occurring within the last 10 years: No  If all of the above answers are "NO", then may proceed with Cephalosporin use.  Has patient had a PCN reaction causing immediate rash, facial/tongue/throat swelling, SOB or lightheadedness with hypotension: Yes, Has patient had a PCN reaction causing severe rash involving mucus membranes or skin necrosis: No, Has patient had a PCN reaction that required hospitalization: No, Has patient had a PCN reaction occurring  within the last 10 years: No, If all of the above answers are "NO", then may proceed with Cephalosporin use.    History of Present Illness    Lance [name] is a 47 y.o. male with a PMH as mentioned above.   In addition to history mentioned above, he is status post successful radiofrequency ablation of a AVNRT in 2022.  History of multiple  percutaneous coronary interventions.  Most recent cardiac cath in 2020, received drug-eluting stent to LAD, staged angioplasty of restenosis OM.  Last seen by Dr. Diona Browner on July 31, 2022.  Was overall doing well at the time.  Recently hospitalized this month for unstable angina.  He also reported feeling more fatigued over the past few weeks, admitted to epigastric pain as well.  Troponins are mildly elevated, flat.  EKG revealed normal sinus rhythm, downsloping ST segments along inferior leads, similar to prior tracings.  Transferred from [facility] to [facility].  LHC revealed multivessel CAD, diffuse 80% in-stent restenosis of prior proximal LAD stents, otherwise mild disease noted.  Underwent successful PCI with [facility] cutting balloon atherotomy and angioplasty of LAD lesions, 80% stenosis was reduced to less than 5%.  Recommended DAPT with aspirin and Brilinta 90 mg twice daily.  Echo revealed normal EF.  Today he presents for hospital follow-up.  He states he is doing well. Denies any issues. Denies any chest pain, shortness of breath, palpitations, syncope, presyncope, dizziness, orthopnea, PND, swelling or significant weight changes, acute bleeding, or claudication. SBP averages 120's-150's per his report. Denies any caffeine use or energy drinks. Denies any salt intake.   EKGs/Labs/Other Studies Reviewed:   The following studies were reviewed today:   EKG:  EKG is not ordered today.    LHC 01/16/2023:    Mid RCA lesion is 25% stenosed.   Prox LAD lesion is 80% stenosed.   Ost 1st Mrg to 1st Mrg lesion is 20% stenosed.   Dist RCA lesion is 40% stenosed.   Mid Cx lesion is 20% stenosed.   Previously placed Prox Cx stent of unknown type is  widely patent.   Scoring balloon angioplasty was performed using a BALL SAPPHIRE NC24 [id number].   Post intervention, there is a 5% residual stenosis.   The left ventricular systolic function is normal.   LV end diastolic pressure is low.    Multivessel CAD with the previously placed proximal LAD stent with diffuse 80% in-stent restenosis.   Left circumflex coronary artery has previously placed proximal and OM1 stents with mild 15% narrowing within the OM stent.  There is mid AV groove 20% stenosis.   The RCA has previously placed mid stent with 20% smooth in-stent narrowing.  There is 40% distal smooth RCA stenosis.   Relatively preserved global LV contractility with LVEDP low at 9 mmHg.  EF estimate approximately 55%.   Successful percutaneous coronary intervention with [facility] cutting balloon atherotomy with noncompliant dilatation with a 3.75 x 15 mm noncompliant balloon.  The 80% stenosis was reduced to less than 5%.   RECOMMENDATION: DAPT with aspirin/Brilinta for minimum of 12 months with probable long-term treatment.  Aggressive lipid-lowering therapy with target LDL less than 55.  Weight loss and optimal blood pressure control are indicated.  Echo 01/2023:   1. The very distal apex is dyskinetic. Marland Kitchen Left ventricular ejection  fraction, by estimation, is 60 to 65%. The left ventricle has normal  function. The left ventricle demonstrates regional wall motion  abnormalities (see scoring diagram/findings for  description).  Left ventricular diastolic parameters are consistent with  Grade I diastolic dysfunction (impaired relaxation). Elevated left atrial  pressure.   2. Right ventricular systolic function is normal. The right ventricular  size is normal.   3. The mitral valve is normal in structure. No evidence of mitral valve  regurgitation. No evidence of mitral stenosis.   4. The aortic valve is tricuspid. There is mild calcification of the  aortic valve. There is mild thickening of the aortic valve. Aortic valve  regurgitation is not visualized. No aortic stenosis is present.   5. The inferior vena cava is normal in size with greater than 50%  respiratory variability, suggesting right atrial pressure of 3  mmHg.  Recent Labs: 10/05/2022: B Natriuretic Peptide 25.0; TSH 2.450 01/16/2023: ALT 27; Magnesium 2.2 01/17/2023: BUN 17; Creatinine, Ser 1.08; Hemoglobin 13.2; Platelets 220; Potassium 4.3; Sodium 132  Recent Lipid Panel    Component Value Date/Time   CHOL 111 09/22/2022 0756   TRIG 197 (H) 09/22/2022 0756   HDL 19 (L) 09/22/2022 0756   CHOLHDL 5.8 (H) 09/22/2022 0756   CHOLHDL 5.5 (H) 10/11/2019 0856   VLDL 47 (H) 03/13/2019 0547   LDLCALC 59 09/22/2022 0756   LDLCALC 63 10/11/2019 0856     Home Medications   Current Meds  Medication Sig   ALPRAZolam (XANAX) 1 MG tablet Take 1 mg by mouth daily as needed for anxiety.   aspirin EC 81 MG tablet Take 1 tablet (81 mg total) by mouth daily with breakfast.   atorvastatin (LIPITOR) 80 MG tablet TAKE 1 TABLET BY MOUTH EVERYDAY AT BEDTIME   blood glucose meter kit and supplies KIT 1 each by Does not apply route 4 (four) times daily. Dispense based on patient and insurance preference. Use up to four times daily as directed. (FOR ICD-9 250.00, 250.01).   Continuous Blood Gluc Sensor (DEXCOM G7 SENSOR) MISC CHANGE SENSOR EVERY 10 DAYS   esomeprazole (NEXIUM) 20 MG capsule Take 20 mg by mouth daily at 12 noon.   insulin aspart (NOVOLOG FLEXPEN) 100 UNIT/ML FlexPen Inject 16-22 Units into the skin 3 (three) times daily with meals. Sliding scale   insulin glargine (LANTUS SOLOSTAR) 100 UNIT/ML Solostar Pen INJECT 70 UNITS INTO THE SKIN AT BEDTIME.   Insulin Pen Needle (B-D ULTRAFINE III SHORT PEN) 31G X 8 MM MISC USE TO INJECT INSULIN 4 (FOUR) TIMES DAILY.   lisinopril (ZESTRIL) 40 MG tablet TAKE 1 TABLET BY MOUTH EVERY DAY   nitroGLYCERIN (NITROSTAT) 0.4 MG SL tablet Place 1 tablet (0.4 mg total) under the tongue every 5 (five) minutes x 3 doses as needed for chest pain.   Omega-3 1000 MG CAPS Take 1,000 mg by mouth daily.   Semaglutide,0.25 or 0.5MG /DOS, (OZEMPIC, 0.25 OR 0.5 MG/DOSE,) 2 MG/1.5ML SOPN Inject 0.5 mg into the skin once a week.     ticagrelor (BRILINTA) 90 MG TABS tablet Take 1 tablet (90 mg total) by mouth 2 (two) times daily.     Review of Systems   All other systems reviewed and are otherwise negative except as noted above.  Physical Exam    VS:  BP 139/79 (BP Location: Right Arm, Patient Position: Sitting, Cuff Size: Large)   Pulse 81   Ht  (1.803 m)   Wt 291 lb 12.8 oz (132.4 kg)   SpO2 96%   BMI 40.70 kg/m  , BMI Body mass index is 40.7 kg/m.  Wt Readings from Last 3 Encounters:  01/26/23 291 lb 12.8 oz (  132.4 kg)  01/17/23 282 lb 11.2 oz (128.2 kg)  10/29/22 294 lb 9.6 oz (133.6 kg)     GEN: Morbidly obese, 47 y.o. male in no acute distress. HEENT: normal. Neck: Supple, no JVD, carotid bruits, or masses. Cardiac: S1/S2, RRR, no murmurs, rubs, or gallops. No clubbing, cyanosis, edema.  Radials/PT 2+ and equal bilaterally. R radial site post cath is healing well and does not have any complications.  Respiratory:  Respirations regular and unlabored, clear to auscultation bilaterally. MS: No deformity or atrophy. Skin: Warm and dry, no rash. Neuro:  Strength and sensation are intact. Psych: Normal affect.  Assessment & Plan    CAD, medication management Previous hx of PCI/DES to mRCA and OM, with total occlusion of dLCX. Recent LHC revealed multivessel CAD, diffuse 80% in-stent restenosis of prior proximal LAD stents, otherwise mild disease noted.  Underwent successful PCI with [facility] cutting balloon atherotomy and angioplasty of LAD lesions, 80% stenosis was reduced to less than 5%. Stable with no anginal symptoms. No complications post cath. Continue ASA, atorvastatin, lisinopril, and Brilinta. Will refill Brilinta per his request. Heart healthy diet and regular cardiovascular exercise encouraged. Okay to start cardiac rehab. Will draw CBC and BMET in 1 week post cath.     Cardiac Rehabilitation Eligibility Assessment  The patient is ready to start cardiac rehabilitation from a  cardiac standpoint.   2. HTN BP on arrival 156/82, repeat BP 139/79. Discussed SBP goal < 130. Discussed to monitor BP at home at least 2 hours after medications and sitting for 5-10 minutes. Given BP log and recommended Omron cuff. Heart healthy diet and regular cardiovascular exercise encouraged. No medication changes at this time. Will bring back in 3-4 weeks for nurse visit and to check accuracy of his home BP cuff. If BP log does not show well controlled readings, plan to stop lisinopril and switch to losartan/valsartan.   3. HLD LDL 59 09/2022.  Continue current medication regimen. Heart healthy diet and regular cardiovascular exercise encouraged.   4. Hx of ischemic cardiomyopathy Recent echo revealed EF 60 to 65%, very distal apex was found to be dyskinetic, regional wall motion abnormalities Left ventricle, grade 1 DD, elevated left atrial pressure. Euvolemic and well compensated on exam.  Continue current medication regimen. Low sodium diet, fluid restriction <2L, and daily weights encouraged. Educated to contact our office for weight gain of 2 lbs overnight or 5 lbs in one week.  5. Tobacco abuse He is weaning himself off tobacco.  Currently using nicotine replacement therapy. Smoking cessation encouraged and discussed.   Disposition: Follow up in 6 month(s) with Nona Dell, MD or APP.  Signed, Sharlene Dory, NP 01/27/2023, 8:55 AM [facility]

## 2023-01-27 ENCOUNTER — Encounter: Payer: Self-pay | Admitting: Nurse Practitioner

## 2023-01-27 ENCOUNTER — Telehealth: Payer: Self-pay | Admitting: Nurse Practitioner

## 2023-01-27 NOTE — Telephone Encounter (Signed)
Pt's wife dropped off FMLA forms needing to be completed for her employer for when pt was in the hospital  Forms completed and signed by pt  $29 forms payment paid with cash and placed in safe  Forms placed on E. Pecks desk to be completed

## 2023-02-04 NOTE — Telephone Encounter (Signed)
FMLA forms completed by E. Philis Nettle  Faxed form to number provided and pt is aware that they are ready for pickup  Will send to billing to place charge

## 2023-02-06 ENCOUNTER — Encounter (HOSPITAL_COMMUNITY): Payer: Self-pay

## 2023-02-06 ENCOUNTER — Emergency Department (HOSPITAL_COMMUNITY): Payer: 59

## 2023-02-06 ENCOUNTER — Other Ambulatory Visit: Payer: Self-pay

## 2023-02-06 ENCOUNTER — Observation Stay (HOSPITAL_COMMUNITY)
Admission: EM | Admit: 2023-02-06 | Discharge: 2023-02-07 | Disposition: A | Discharge status: Home / Self Care | Payer: 59 | Attending: Internal Medicine | Admitting: Internal Medicine

## 2023-02-06 DIAGNOSIS — R079 Chest pain, unspecified: Secondary | ICD-10-CM | POA: Diagnosis present

## 2023-02-06 DIAGNOSIS — Z794 Long term (current) use of insulin: Secondary | ICD-10-CM

## 2023-02-06 DIAGNOSIS — E669 Obesity, unspecified: Secondary | ICD-10-CM | POA: Diagnosis not present

## 2023-02-06 DIAGNOSIS — Z7982 Long term (current) use of aspirin: Secondary | ICD-10-CM | POA: Diagnosis not present

## 2023-02-06 DIAGNOSIS — F17210 Nicotine dependence, cigarettes, uncomplicated: Secondary | ICD-10-CM | POA: Insufficient documentation

## 2023-02-06 DIAGNOSIS — E119 Type 2 diabetes mellitus without complications: Secondary | ICD-10-CM | POA: Insufficient documentation

## 2023-02-06 DIAGNOSIS — I2510 Atherosclerotic heart disease of native coronary artery without angina pectoris: Secondary | ICD-10-CM

## 2023-02-06 DIAGNOSIS — E782 Mixed hyperlipidemia: Secondary | ICD-10-CM | POA: Diagnosis not present

## 2023-02-06 DIAGNOSIS — Z7985 Long-term (current) use of injectable non-insulin antidiabetic drugs: Secondary | ICD-10-CM | POA: Insufficient documentation

## 2023-02-06 DIAGNOSIS — Z79899 Other long term (current) drug therapy: Secondary | ICD-10-CM | POA: Insufficient documentation

## 2023-02-06 DIAGNOSIS — I471 Supraventricular tachycardia, unspecified: Secondary | ICD-10-CM | POA: Diagnosis not present

## 2023-02-06 DIAGNOSIS — R0789 Other chest pain: Secondary | ICD-10-CM | POA: Diagnosis not present

## 2023-02-06 DIAGNOSIS — Z955 Presence of coronary angioplasty implant and graft: Secondary | ICD-10-CM | POA: Diagnosis not present

## 2023-02-06 DIAGNOSIS — I10 Essential (primary) hypertension: Secondary | ICD-10-CM

## 2023-02-06 DIAGNOSIS — F17200 Nicotine dependence, unspecified, uncomplicated: Secondary | ICD-10-CM | POA: Diagnosis present

## 2023-02-06 DIAGNOSIS — E1159 Type 2 diabetes mellitus with other circulatory complications: Secondary | ICD-10-CM

## 2023-02-06 DIAGNOSIS — Z9861 Coronary angioplasty status: Secondary | ICD-10-CM

## 2023-02-06 DIAGNOSIS — E118 Type 2 diabetes mellitus with unspecified complications: Secondary | ICD-10-CM

## 2023-02-06 DIAGNOSIS — R072 Precordial pain: Secondary | ICD-10-CM | POA: Diagnosis present

## 2023-02-06 DIAGNOSIS — Z720 Tobacco use: Secondary | ICD-10-CM

## 2023-02-06 DIAGNOSIS — Z6839 Body mass index (BMI) 39.0-39.9, adult: Secondary | ICD-10-CM | POA: Insufficient documentation

## 2023-02-06 LAB — LIPASE, BLOOD: Lipase: 33 U/L (ref 11–51)

## 2023-02-06 LAB — CBC
HCT: 38.9 % — ABNORMAL LOW (ref 39.0–52.0)
Hemoglobin: 13.1 g/dL (ref 13.0–17.0)
MCH: 27.7 pg (ref 26.0–34.0)
MCHC: 33.7 g/dL (ref 30.0–36.0)
MCV: 82.2 fL (ref 80.0–100.0)
Platelets: 232 10*3/uL (ref 150–400)
RBC: 4.73 MIL/uL (ref 4.22–5.81)
RDW: 14.1 % (ref 11.5–15.5)
WBC: 12.4 10*3/uL — ABNORMAL HIGH (ref 4.0–10.5)
nRBC: 0 % (ref 0.0–0.2)

## 2023-02-06 LAB — TROPONIN I (HIGH SENSITIVITY)
Troponin I (High Sensitivity): 9 ng/L (ref <18)
Troponin I (High Sensitivity): 10 ng/L (ref <18)

## 2023-02-06 LAB — HEPATIC FUNCTION PANEL
ALT: 33 U/L (ref 0–44)
AST: 22 U/L (ref 15–41)
Albumin: 3.7 g/dL (ref 3.5–5.0)
Alkaline Phosphatase: 90 U/L (ref 38–126)
Bilirubin, Direct: 0.1 mg/dL (ref 0.0–0.2)
Indirect Bilirubin: 0.7 mg/dL (ref 0.3–0.9)
Total Bilirubin: 0.8 mg/dL (ref 0.3–1.2)
Total Protein: 6.6 g/dL (ref 6.5–8.1)

## 2023-02-06 LAB — BASIC METABOLIC PANEL
Anion gap: 8 (ref 5–15)
BUN: 15 mg/dL (ref 6–20)
CO2: 24 mmol/L (ref 22–32)
Calcium: 8.6 mg/dL — ABNORMAL LOW (ref 8.9–10.3)
Chloride: 100 mmol/L (ref 98–111)
Creatinine, Ser: 1.16 mg/dL (ref 0.61–1.24)
GFR, Estimated: >60 mL/min (ref >60)
Glucose, Bld: 126 mg/dL — ABNORMAL HIGH (ref 70–99)
Potassium: 3.5 mmol/L (ref 3.5–5.1)
Sodium: 132 mmol/L — ABNORMAL LOW (ref 135–145)

## 2023-02-06 MED ORDER — ACETAMINOPHEN 650 MG RE SUPP: 650.0000 mg | Freq: Four times a day (QID) | RECTAL | Status: DC | PRN

## 2023-02-06 MED ORDER — ACETAMINOPHEN 325 MG PO TABS: 650.0000 mg | ORAL_TABLET | Freq: Four times a day (QID) | ORAL | Status: DC | PRN

## 2023-02-06 MED ORDER — ONDANSETRON HCL 4 MG/2ML IJ SOLN: 4.0000 mg | Freq: Four times a day (QID) | INTRAMUSCULAR | Status: DC | PRN

## 2023-02-06 MED ORDER — ENOXAPARIN SODIUM 40 MG/0.4ML IJ SOSY
40.0000 mg | PREFILLED_SYRINGE | INTRAMUSCULAR | Status: DC
  Filled 2023-02-06: qty 0.4

## 2023-02-06 MED ORDER — ONDANSETRON HCL 4 MG PO TABS: 4.0000 mg | ORAL_TABLET | Freq: Four times a day (QID) | ORAL | Status: DC | PRN

## 2023-02-06 MED ORDER — NITROGLYCERIN 0.4 MG SL SUBL
0.4000 mg | SUBLINGUAL_TABLET | Freq: Once | SUBLINGUAL | Status: AC
  Administered 2023-02-06: 0.4 mg via SUBLINGUAL
  Filled 2023-02-06 (×2): qty 1

## 2023-02-06 NOTE — ED Notes (Signed)
Patient transported to CT 

## 2023-02-06 NOTE — ED Triage Notes (Signed)
Pt presents with chest pain that started arrox 30 mins ago. Pt took 3 nitro with no relief. Pt had stent placement x3 weeks ago.

## 2023-02-06 NOTE — H&P (Signed)
History and Physical    Patient: Lance [name] ZOX:[id number] DOB: [birth year] DOA: 02/06/2023 DOS: the patient was seen and examined on 02/07/2023 PCP: Lianne Moris, PA-C  Patient coming from: Home  Chief Complaint:  Chief Complaint  Patient presents with   Chest Pain   HPI: Lance [name] is a 47 y.o. male with medical history significant of hypertension, GERD, T2DM, CAD s/p stent placement who presented to the emergency department due to chest pressure which started around the xiphoid process and was associated with diaphoresis.  Pain was similar to when he had stent placement (01/16/2023) and it started 30 minutes prior to arrival to the ED, he took 3 nitro sublingual with minimal relief.  EMS was activated and patient was taken to the ED for further evaluation  ED Course:  In the emergency department, BP was 145/65, other vital signs were within normal range.  Workup in the ED showed normal CBC except WBC of 12.4 and hematocrit of 30.9.  BMP was normal except for sodium of 132, blood glucose 126.  Troponin x 2 was normal, hepatic function panel was normal Chest x-ray showed no signs of pulmonary edema or focal pulmonary consolidation  Review of Systems: Review of systems as noted in the HPI. All other systems reviewed and are negative.   Past Medical History:  Diagnosis Date   CAD (coronary artery disease), native coronary artery    10/18 PCI/DES to mRCA, and OM, with total occlusion of dLCx with collaterals.    Essential hypertension    GERD (gastroesophageal reflux disease)    History of kidney stones    Hyperlipemia    SVT (supraventricular tachycardia) 2015   Converted with adenosine   Type 2 diabetes mellitus (HCC)    Past Surgical History:  Procedure Laterality Date   CARDIAC SURGERY     CORONARY ANGIOPLASTY WITH STENT PLACEMENT  07/15/2017   "2 stents"   CORONARY BALLOON ANGIOPLASTY N/A 01/16/2023   Procedure: CORONARY BALLOON ANGIOPLASTY;  Surgeon: Lennette Bihari, MD;   Location: [facility];  Service: Cardiovascular;  Laterality: N/A;   CORONARY/GRAFT ACUTE MI REVASCULARIZATION N/A 03/12/2019   Procedure: Coronary/Graft Acute MI Revascularization;  Surgeon: Yvonne Kendall, MD;  Location: [facility];  Service: Cardiovascular;  Laterality: N/A;   LAPAROSCOPIC APPENDECTOMY N/A 11/12/2017   Procedure: APPENDECTOMY LAPAROSCOPIC, REPAIR OF INCARCERATED INCISIONAL HERNIA;  Surgeon: Karie Soda, MD;  Location: [facility];  Service: General;  Laterality: N/A;   LAPAROSCOPIC CHOLECYSTECTOMY     LEFT HEART CATH AND CORONARY ANGIOGRAPHY N/A 07/15/2017   Procedure: LEFT HEART CATH AND CORONARY ANGIOGRAPHY;  Surgeon: Tonny Bollman, MD;  Location: [facility];  Service: Cardiovascular;  Laterality: N/A;   LEFT HEART CATH AND CORONARY ANGIOGRAPHY N/A 03/12/2019   Procedure: LEFT HEART CATH AND CORONARY ANGIOGRAPHY;  Surgeon: Yvonne Kendall, MD;  Location: [facility];  Service: Cardiovascular;  Laterality: N/A;   LEFT HEART CATH AND CORONARY ANGIOGRAPHY N/A 01/16/2023   Procedure: LEFT HEART CATH AND CORONARY ANGIOGRAPHY;  Surgeon: Lennette Bihari, MD;  Location: [facility];  Service: Cardiovascular;  Laterality: N/A;   SVT ABLATION N/A 04/22/2021   Procedure: SVT ABLATION;  Surgeon: Marinus Maw, MD;  Location: [facility];  Service: Cardiovascular;  Laterality: N/A;    Social History:  reports that he has been smoking cigarettes. He has a 13.00 pack-year smoking history. He has never been exposed to tobacco smoke. He has never used smokeless tobacco.  He reports that he does not drink alcohol and does not use drugs.   Allergies  Allergen Reactions   Bee Venom Anaphylaxis and Swelling   Penicillins Hives, Itching and Rash    Has patient had a PCN reaction causing immediate rash, facial/tongue/throat swelling, SOB or lightheadedness with hypotension: No  Has patient had a PCN reaction causing severe rash involving mucus membranes  or skin necrosis: No  Has patient had a PCN reaction that required hospitalization: No  Has patient had a PCN reaction occurring within the last 10 years: No  If all of the above answers are "NO", then may proceed with Cephalosporin use.  Has patient had a PCN reaction causing immediate rash, facial/tongue/throat swelling, SOB or lightheadedness with hypotension: Yes, Has patient had a PCN reaction causing severe rash involving mucus membranes or skin necrosis: No, Has patient had a PCN reaction that required hospitalization: No, Has patient had a PCN reaction occurring within the last 10 years: No, If all of the above answers are "NO", then may proceed with Cephalosporin use.    Family History  Problem Relation Age of Onset   Heart disease Mother 43       CABG   Diabetes Mother    Gout Mother    Heart attack Father 53     Prior to Admission medications   Medication Sig Start Date End Date Taking? Authorizing Provider  ALPRAZolam Prudy Feeler) 1 MG tablet Take 1 mg by mouth daily as needed for anxiety.    [provider]  aspirin EC 81 MG tablet Take 1 tablet (81 mg total) by mouth daily with breakfast. 04/05/19   Mariea Clonts, Courage, MD  atorvastatin (LIPITOR) 80 MG tablet TAKE 1 TABLET BY MOUTH EVERYDAY AT BEDTIME 09/09/22   Jonelle Sidle, MD  blood glucose meter kit and supplies KIT 1 each by Does not apply route 4 (four) times daily. Dispense based on patient and insurance preference. Use up to four times daily as directed. (FOR ICD-9 250.00, 250.01). 02/14/20   Roma Kayser, MD  Continuous Blood Gluc Sensor (DEXCOM G7 SENSOR) MISC CHANGE SENSOR EVERY 10 DAYS 12/18/22   Roma Kayser, MD  esomeprazole (NEXIUM) 20 MG capsule Take 20 mg by mouth daily at 12 noon.    [provider]  insulin aspart (NOVOLOG FLEXPEN) 100 UNIT/ML FlexPen Inject 16-22 Units into the skin 3 (three) times daily with meals. Sliding scale 10/29/22   Roma Kayser, MD  insulin  glargine (LANTUS SOLOSTAR) 100 UNIT/ML Solostar Pen INJECT 70 UNITS INTO THE SKIN AT BEDTIME. 01/06/23   Nida, Denman George, MD  Insulin Pen Needle (B-D ULTRAFINE III SHORT PEN) 31G X 8 MM MISC USE TO INJECT INSULIN 4 (FOUR) TIMES DAILY. 12/10/22   Roma Kayser, MD  lisinopril (ZESTRIL) 40 MG tablet TAKE 1 TABLET BY MOUTH EVERY DAY 12/05/22   Jonelle Sidle, MD  nitroGLYCERIN (NITROSTAT) 0.4 MG SL tablet Place 1 tablet (0.4 mg total) under the tongue every 5 (five) minutes x 3 doses as needed for chest pain. 09/02/21   Jonelle Sidle, MD  Omega-3 1000 MG CAPS Take 1,000 mg by mouth daily.    [provider]  Semaglutide,0.25 or 0.5MG /DOS, (OZEMPIC, 0.25 OR 0.5 MG/DOSE,) 2 MG/1.5ML SOPN Inject 0.5 mg into the skin once a week.    [provider]  ticagrelor (BRILINTA) 90 MG TABS tablet Take 1 tablet (90 mg total) by mouth 2 (two) times daily. 01/26/23   Sharlene Dory,  NP    Physical Exam: BP (!) 149/86 (BP Location: Left Arm)   Pulse 83   Temp 97.9 F (36.6 C) (Oral)   Resp 16   Ht 5\' 11"  (1.803 m)   Wt 129.7 kg   SpO2 97%   BMI 39.89 kg/m   General: 47 y.o. year-old male well developed well nourished in no acute distress.  Alert and oriented x3. HEENT: NCAT, EOMI Neck: Supple, trachea medial Cardiovascular: Regular rate and rhythm with no rubs or gallops.  No thyromegaly or JVD noted.  No lower extremity edema. 2/4 pulses in all 4 extremities. Respiratory: Clear to auscultation with no wheezes or rales. Good inspiratory effort. Abdomen: Soft, nontender nondistended with normal bowel sounds x4 quadrants. Muskuloskeletal: No cyanosis, clubbing or edema noted bilaterally Neuro: CN II-XII intact, strength 5/5 x 4, sensation, reflexes intact Skin: No ulcerative lesions noted or rashes Psychiatry: Judgement and insight appear normal. Mood is appropriate for condition and setting          Labs on Admission:  Basic Metabolic Panel: Recent Labs  Lab  02/06/23 2025  NA 132*  K 3.5  CL 100  CO2 24  GLUCOSE 126*  BUN 15  CREATININE 1.16  CALCIUM 8.6*   Liver Function Tests: Recent Labs  Lab 02/06/23 2025  AST 22  ALT 33  ALKPHOS 90  BILITOT 0.8  PROT 6.6  ALBUMIN 3.7   Recent Labs  Lab 02/06/23 2025  LIPASE 33   No results for input(s): "AMMONIA" in the last 168 hours. CBC: Recent Labs  Lab 02/06/23 2025  WBC 12.4*  HGB 13.1  HCT 38.9*  MCV 82.2  PLT 232   Cardiac Enzymes: No results for input(s): "CKTOTAL", "CKMB", "CKMBINDEX", "TROPONINI" in the last 168 hours.  BNP (last 3 results) Recent Labs    10/05/22 1006  BNP 25.0    ProBNP (last 3 results) No results for input(s): "PROBNP" in the last 8760 hours.  CBG: No results for input(s): "GLUCAP" in the last 168 hours.  Radiological Exams on Admission: DG Chest 2 View  Result Date: 02/06/2023 CLINICAL DATA:  Chest pain, shortness of breath EXAM: CHEST - 2 VIEW COMPARISON:  01/15/2023 FINDINGS: Transverse diameter of heart is slightly increased. There are no signs of pulmonary edema or focal pulmonary consolidation. There is no pleural effusion or pneumothorax. IMPRESSION: There are no signs of pulmonary edema or focal pulmonary consolidation. Electronically Signed   By: Ernie Avena M.D.   On: 02/06/2023 20:09    EKG: I independently viewed the EKG done and my findings are as followed: Normal sinus rhythm at a rate of 78 bpm  Assessment/Plan Present on Admission:  Atypical chest pain  CAD (coronary artery disease)  SVT (supraventricular tachycardia)  Tobacco abuse  Essential hypertension, benign  Obesity (BMI 30-39.9)  Mixed hyperlipidemia  Principal Problem:   Atypical chest pain Active Problems:   Essential hypertension, benign   Mixed hyperlipidemia   Tobacco abuse   CAD (coronary artery disease)   SVT (supraventricular tachycardia)   Type 2 diabetes mellitus with complication, with long-term current use of insulin (HCC)    Obesity (BMI 30-39.9)   Atypical Chest Pain Continue telemetry  Troponins x2 - 9 > 10 EKG personally reviewed showed normal sinus rhythm at a rate of 70 bpm Chest pain already resolved Echocardiogram will be done in the morning Give aspirin, nitroglycerin prn  CAD  Echo (01/16/2023) showed LVEF of 60-65% with dyskinesis of very distal apex and grade 1  diastolic dysfunction.  LHC (01/06/2023) showed multivessel CAD with diffuse 80% in-stent restenosis of prior proximal LAD stent with otherwise only mild disease.  Continue aspirin and Brilinta  SVT  Patient is s/p ablation in 04/2021. EKG personally reviewed showed normal sinus rhythm at rate of 78 bpm Continue telemetry   Essential hypertension (uncontrolled) Continue lisinopril   Mixed hyperlipidemia Continue Atorvastatin 80 mg daily.   Type 2 DM Hgb A1c was at 7.1 on 01/16/2023 Continue Semglee 6 units nightly and adjust dose accordingly Continue ISS and hypoglycemia protocol  GERD Continue Protonix  Obesity (BMI 39.89) Diet and lifestyle modification  Alcohol abuse Patient was counseled on tobacco use cessation   DVT prophylaxis: Lovenox  Advance Care Planning: Full code  Consults: None  Family Communication: Wife at bedside (all questions answered to satisfaction)  Severity of Illness: The appropriate patient status for this patient is OBSERVATION. Observation status is judged to be reasonable and necessary in order to provide the required intensity of service to ensure the patient's safety. The patient's presenting symptoms, physical exam findings, and initial radiographic and laboratory data in the context of their medical condition is felt to place them at decreased risk for further clinical deterioration. Furthermore, it is anticipated that the patient will be medically stable for discharge from the hospital within 2 midnights of admission.   Author: Frankey Shown, DO 02/07/2023 5:27 AM  For on call review  [email or web address].

## 2023-02-06 NOTE — ED Provider Notes (Signed)
[place] EMERGENCY DEPARTMENT AT [facility] Provider Note   CSN: [id number] Arrival date & time: 02/06/23  1943     History {Add pertinent medical, surgical, social history, OB history to HPI:1} Chief Complaint  Patient presents with   Chest Pain    Lance [name] is a 47 y.o. male.  Patient complains of upper epigastric pain similar to when he had his recent stent placed.  He had a stent placed approximately 3 to 4 weeks ago   Chest Pain      Home Medications Prior to Admission medications   Medication Sig Start Date End Date Taking? Authorizing Provider  ALPRAZolam Prudy Feeler) 1 MG tablet Take 1 mg by mouth daily as needed for anxiety.    [provider]  aspirin EC 81 MG tablet Take 1 tablet (81 mg total) by mouth daily with breakfast. 04/05/19   Mariea Clonts, Courage, MD  atorvastatin (LIPITOR) 80 MG tablet TAKE 1 TABLET BY MOUTH EVERYDAY AT BEDTIME 09/09/22   Jonelle Sidle, MD  blood glucose meter kit and supplies KIT 1 each by Does not apply route 4 (four) times daily. Dispense based on patient and insurance preference. Use up to four times daily as directed. (FOR ICD-9 250.00, 250.01). 02/14/20   Roma Kayser, MD  Continuous Blood Gluc Sensor (DEXCOM G7 SENSOR) MISC CHANGE SENSOR EVERY 10 DAYS 12/18/22   Roma Kayser, MD  esomeprazole (NEXIUM) 20 MG capsule Take 20 mg by mouth daily at 12 noon.    [provider]  insulin aspart (NOVOLOG FLEXPEN) 100 UNIT/ML FlexPen Inject 16-22 Units into the skin 3 (three) times daily with meals. Sliding scale 10/29/22   Roma Kayser, MD  insulin glargine (LANTUS SOLOSTAR) 100 UNIT/ML Solostar Pen INJECT 70 UNITS INTO THE SKIN AT BEDTIME. 01/06/23   Nida, Denman George, MD  Insulin Pen Needle (B-D ULTRAFINE III SHORT PEN) 31G X 8 MM MISC USE TO INJECT INSULIN 4 (FOUR) TIMES DAILY. 12/10/22   Roma Kayser, MD  lisinopril (ZESTRIL) 40 MG tablet TAKE 1 TABLET BY MOUTH EVERY DAY 12/05/22    Jonelle Sidle, MD  nitroGLYCERIN (NITROSTAT) 0.4 MG SL tablet Place 1 tablet (0.4 mg total) under the tongue every 5 (five) minutes x 3 doses as needed for chest pain. 09/02/21   Jonelle Sidle, MD  Omega-3 1000 MG CAPS Take 1,000 mg by mouth daily.    [provider]  Semaglutide,0.25 or 0.5MG /DOS, (OZEMPIC, 0.25 OR 0.5 MG/DOSE,) 2 MG/1.5ML SOPN Inject 0.5 mg into the skin once a week.    [provider]  ticagrelor (BRILINTA) 90 MG TABS tablet Take 1 tablet (90 mg total) by mouth 2 (two) times daily. 01/26/23   Sharlene Dory, NP      Allergies    Bee venom and Penicillins    Review of Systems   Review of Systems  Cardiovascular:  Positive for chest pain.    Physical Exam Updated Vital Signs BP (!) 120/54   Pulse 77   Temp 97.6 F (36.4 C) (Oral)   Resp 16   Ht 5\' 11"  (1.803 m)   Wt 129.7 kg   SpO2 96%   BMI 39.89 kg/m  Physical Exam  ED Results / Procedures / Treatments   Labs (all labs ordered are listed, but only abnormal results are displayed) Labs Reviewed  BASIC METABOLIC PANEL - Abnormal; Notable for the following components:      Result Value   Sodium 132 (*)  Glucose, Bld 126 (*)    Calcium 8.6 (*)    All other components within normal limits  CBC - Abnormal; Notable for the following components:   WBC 12.4 (*)    HCT 38.9 (*)    All other components within normal limits  LIPASE, BLOOD  HEPATIC FUNCTION PANEL  TROPONIN I (HIGH SENSITIVITY)  TROPONIN I (HIGH SENSITIVITY)    EKG EKG Interpretation  Date/Time:  Friday Feb 06 2023 19:52:10 EDT Ventricular Rate:  96 PR Interval:  184 QRS Duration: 98 QT Interval:  354 QTC Calculation: 448 R Axis:   93 Text Interpretation: Sinus rhythm Borderline right axis deviation Confirmed by Bethann Berkshire [id number]) on 02/06/2023 8:20:43 PM  Radiology DG Chest 2 View  Result Date: 02/06/2023 CLINICAL DATA:  Chest pain, shortness of breath EXAM: CHEST - 2 VIEW COMPARISON:  01/15/2023  FINDINGS: Transverse diameter of heart is slightly increased. There are no signs of pulmonary edema or focal pulmonary consolidation. There is no pleural effusion or pneumothorax. IMPRESSION: There are no signs of pulmonary edema or focal pulmonary consolidation. Electronically Signed   By: Ernie Avena M.D.   On: 02/06/2023 20:09    Procedures Procedures  {Document cardiac monitor, telemetry assessment procedure when appropriate:1}  Medications Ordered in ED Medications  nitroGLYCERIN (NITROSTAT) SL tablet 0.4 mg (0.4 mg Sublingual Given 02/06/23 2101)    ED Course/ Medical Decision Making/ A&P   {   Click here for ABCD2, HEART and other calculatorsREFRESH Note before signing :1}                          Medical Decision Making Amount and/or Complexity of Data Reviewed Labs: ordered. Radiology: ordered.  Risk Prescription drug management. Decision regarding hospitalization.   Patient with epigastric pain possibly related to coronary artery disease.  He has had 2 normal troponins.  He will be admitted to medicine and have his troponins cycled  {Document critical care time when appropriate:1} {Document review of labs and clinical decision tools ie heart score, Chads2Vasc2 etc:1}  {Document your independent review of radiology images, and any outside records:1} {Document your discussion with family members, caretakers, and with consultants:1} {Document social determinants of health affecting pt's care:1} {Document your decision making why or why not admission, treatments were needed:1} Final Clinical Impression(s) / ED Diagnoses Final diagnoses:  Atypical chest pain    Rx / DC Orders ED Discharge Orders     None

## 2023-02-06 NOTE — ED Notes (Signed)
Pt called me into room; reported feeling sweaty & asked if he could should take his own nitro.  Pt states he is having epigastric "discomfort" VS updated. EDP made aware.

## 2023-02-07 ENCOUNTER — Observation Stay (HOSPITAL_BASED_OUTPATIENT_CLINIC_OR_DEPARTMENT_OTHER): Payer: 59

## 2023-02-07 DIAGNOSIS — E669 Obesity, unspecified: Secondary | ICD-10-CM | POA: Diagnosis not present

## 2023-02-07 DIAGNOSIS — R0789 Other chest pain: Secondary | ICD-10-CM | POA: Diagnosis not present

## 2023-02-07 DIAGNOSIS — R079 Chest pain, unspecified: Secondary | ICD-10-CM

## 2023-02-07 DIAGNOSIS — I10 Essential (primary) hypertension: Secondary | ICD-10-CM | POA: Diagnosis not present

## 2023-02-07 DIAGNOSIS — Z720 Tobacco use: Secondary | ICD-10-CM | POA: Diagnosis not present

## 2023-02-07 LAB — COMPREHENSIVE METABOLIC PANEL
ALT: 31 U/L (ref 0–44)
AST: 22 U/L (ref 15–41)
Albumin: 3.5 g/dL (ref 3.5–5.0)
Alkaline Phosphatase: 87 U/L (ref 38–126)
Anion gap: 6 (ref 5–15)
BUN: 17 mg/dL (ref 6–20)
CO2: 26 mmol/L (ref 22–32)
Calcium: 8.8 mg/dL — ABNORMAL LOW (ref 8.9–10.3)
Chloride: 101 mmol/L (ref 98–111)
Creatinine, Ser: 1.09 mg/dL (ref 0.61–1.24)
GFR, Estimated: >60 mL/min (ref >60)
Glucose, Bld: 137 mg/dL — ABNORMAL HIGH (ref 70–99)
Potassium: 3.9 mmol/L (ref 3.5–5.1)
Sodium: 133 mmol/L — ABNORMAL LOW (ref 135–145)
Total Bilirubin: 1 mg/dL (ref 0.3–1.2)
Total Protein: 6.5 g/dL (ref 6.5–8.1)

## 2023-02-07 LAB — CBC
HCT: 41.1 % (ref 39.0–52.0)
Hemoglobin: 13.3 g/dL (ref 13.0–17.0)
MCH: 26.9 pg (ref 26.0–34.0)
MCHC: 32.4 g/dL (ref 30.0–36.0)
MCV: 83 fL (ref 80.0–100.0)
Platelets: 230 10*3/uL (ref 150–400)
RBC: 4.95 MIL/uL (ref 4.22–5.81)
RDW: 14.3 % (ref 11.5–15.5)
WBC: 11.9 10*3/uL — ABNORMAL HIGH (ref 4.0–10.5)
nRBC: 0 % (ref 0.0–0.2)

## 2023-02-07 LAB — ECHOCARDIOGRAM LIMITED
Area-P 1/2: 2.11 cm2
Height: 71 in
S' Lateral: 2.9 cm
Weight: 4576 oz

## 2023-02-07 LAB — PHOSPHORUS: Phosphorus: 3.8 mg/dL (ref 2.5–4.6)

## 2023-02-07 LAB — MAGNESIUM: Magnesium: 2.1 mg/dL (ref 1.7–2.4)

## 2023-02-07 MED ORDER — LISINOPRIL 10 MG PO TABS
40.0000 mg | ORAL_TABLET | Freq: Every day | ORAL | Status: DC
  Administered 2023-02-07: 40 mg via ORAL
  Filled 2023-02-07: qty 4

## 2023-02-07 MED ORDER — INSULIN ASPART 100 UNIT/ML IJ SOLN: 0.0000 [IU] | Freq: Every day | INTRAMUSCULAR | Status: DC

## 2023-02-07 MED ORDER — PERFLUTREN LIPID MICROSPHERE
1.0000 mL | INTRAVENOUS | Status: DC | PRN
  Administered 2023-02-07: 5 mL via INTRAVENOUS

## 2023-02-07 MED ORDER — INSULIN GLARGINE-YFGN 100 UNIT/ML ~~LOC~~ SOLN
6.0000 [IU] | Freq: Every day | SUBCUTANEOUS | Status: DC
  Filled 2023-02-07: qty 0.06

## 2023-02-07 MED ORDER — ATORVASTATIN CALCIUM 40 MG PO TABS
80.0000 mg | ORAL_TABLET | Freq: Every day | ORAL | Status: DC
  Administered 2023-02-07: 80 mg via ORAL
  Filled 2023-02-07: qty 2

## 2023-02-07 MED ORDER — TICAGRELOR 90 MG PO TABS
90.0000 mg | ORAL_TABLET | Freq: Two times a day (BID) | ORAL | Status: DC
  Administered 2023-02-07: 90 mg via ORAL
  Filled 2023-02-07: qty 1

## 2023-02-07 MED ORDER — ISOSORBIDE MONONITRATE ER 60 MG PO TB24: 30.0000 mg | ORAL_TABLET | Freq: Every day | ORAL | Status: DC

## 2023-02-07 MED ORDER — ISOSORBIDE MONONITRATE ER 30 MG PO TB24: 30.0000 mg | ORAL_TABLET | Freq: Every day | ORAL | 1 refills | Status: DC

## 2023-02-07 MED ORDER — PANTOPRAZOLE SODIUM 40 MG PO TBEC
40.0000 mg | DELAYED_RELEASE_TABLET | Freq: Every day | ORAL | Status: DC
  Administered 2023-02-07: 40 mg via ORAL
  Filled 2023-02-07: qty 1

## 2023-02-07 MED ORDER — NITROGLYCERIN 0.4 MG SL SUBL: 0.4000 mg | SUBLINGUAL_TABLET | SUBLINGUAL | Status: DC | PRN

## 2023-02-07 MED ORDER — ASPIRIN 81 MG PO TBEC
81.0000 mg | DELAYED_RELEASE_TABLET | Freq: Every day | ORAL | Status: DC
  Administered 2023-02-07: 81 mg via ORAL
  Filled 2023-02-07: qty 1

## 2023-02-07 MED ORDER — INSULIN ASPART 100 UNIT/ML IJ SOLN: 0.0000 [IU] | Freq: Three times a day (TID) | INTRAMUSCULAR | Status: DC

## 2023-02-07 NOTE — Discharge Summary (Signed)
Physician Discharge Summary   Patient: Lance [name] MRN: [id number] DOB: [birth year]  Admit date:     02/06/2023  Discharge date: 02/07/23  Discharge Physician: Onalee Hua Eudell Mcphee   PCP: Lianne Moris, PA-C   Recommendations at discharge:   Please follow up with primary care provider within 1-2 weeks  Please repeat BMP and CBC in one week      Hospital Course: 47 year old male with a history of hypertension, diabetes mellitus type 2, coronary artery disease presenting with substernal chest discomfort and diaphoresis about 30 minutes prior to admission.  He states that his chest discomfort was substernal and burning in nature.  He stated that it occurred after he took a few bites of food.  He denied any worsening shortness of breath or radiation to his jaw or arms.  Notably, the patient had a recent hospital admission from 01/15/2023 to 01/17/2023 for accelerated angina.  He underwent left heart catheterization on 01/16/2023 which showed multivessel CAD.  There was 80% in-stent stenosis in the proximal LAD.  He underwent balloon atherotomy.  He was instructed to continue his statin, aspirin, and Brilinta.  He endorses compliance with all his medications.  He states that he has been feeling well since his heart catheterization.  He denies any exertional chest discomfort.  He states that he has been walking for about 15 to 20 minutes each day without any chest discomfort since his heart catheterization.  He states that his endurance has been as good as usual.  He took 3 sublingual nitroglycerin with minimal relief.  As result, EMS was activated. In the ED, the patient was afebrile and hemodynamically stable with oxygen saturation 99% room air.  WBC 12.4, hemoglobin 13.1, platelets 232,000.  AST 33, ALT 22, alkaline phosphatase 90, total bilirubin 0.8.  Sodium 132, potassium 3.5, bicarbonate 24, serum creatinine 1.16.  Troponin 9>>10.    Assessment and Plan: Chest Pain -appears atypical by history -troponin  9>>10 -Echo EF 60-65%, akinetic distal apex (unchanged), G1DD, normal RVF -Echo unchanged from 01/16/23 -no further chest discomfort during hospitalization -started imdur  CAD  Echo (01/16/2023) showed LVEF of 60-65% with dyskinesis of very distal apex and grade 1 diastolic dysfunction.  LHC (01/06/2023) showed multivessel CAD with diffuse 80% in-stent restenosis of prior proximal LAD stent with otherwise only mild disease s/p balloon atherotomy Continue aspirin and Brilinta  SVT  Patient is s/p ablation in 04/2021. EKG personally reviewed showed normal sinus rhythm at rate of 78  Continue telemetry--no SVT  Essential hypertension (uncontrolled) Continue lisinopril   Mixed hyperlipidemia Continue Atorvastatin 80 mg daily.   Type 2 DM Hgb A1c was at 7.1 on 01/16/2023 Continue Semglee 6 units nightly and adjust dose accordingly Continue ISS and hypoglycemia protocol   GERD Continue Protonix   Class 2 Obesity (BMI 39.89) Diet and lifestyle modification   Tobacco abuse Patient was counseled on tobacco use cessation      Consultants: none Procedures performed: none  Disposition: Home Diet recommendation:  Cardiac and Carb modified diet DISCHARGE MEDICATION: Allergies as of 02/07/2023       Reactions   Bee Venom Anaphylaxis, Swelling   Penicillins Hives, Itching, Rash   Has patient had a PCN reaction causing immediate rash, facial/tongue/throat swelling, SOB or lightheadedness with hypotension: No Has patient had a PCN reaction causing severe rash involving mucus membranes or skin necrosis: No Has patient had a PCN reaction that required hospitalization: No Has patient had a PCN reaction occurring within the last 10 years: No If  all of the above answers are "NO", then may proceed with Cephalosporin use. Has patient had a PCN reaction causing immediate rash, facial/tongue/throat swelling, SOB or lightheadedness with hypotension: Yes, Has patient had a PCN reaction causing  severe rash involving mucus membranes or skin necrosis: No, Has patient had a PCN reaction that required hospitalization: No, Has patient had a PCN reaction occurring within the last 10 years: No, If all of the above answers are "NO", then may proceed with Cephalosporin use.        Medication List     TAKE these medications    ALPRAZolam 1 MG tablet Commonly known as: XANAX Take 1 mg by mouth daily as needed for anxiety.   aspirin EC 81 MG tablet Take 1 tablet (81 mg total) by mouth daily with breakfast.   atorvastatin 80 MG tablet Commonly known as: LIPITOR TAKE 1 TABLET BY MOUTH EVERYDAY AT BEDTIME What changed: See the new instructions.   B-D ULTRAFINE III SHORT PEN 31G X 8 MM Misc Generic drug: Insulin Pen Needle USE TO INJECT INSULIN 4 (FOUR) TIMES DAILY.   blood glucose meter kit and supplies Kit 1 each by Does not apply route 4 (four) times daily. Dispense based on patient and insurance preference. Use up to four times daily as directed. (FOR ICD-9 250.00, 250.01).   Dexcom G7 Sensor Misc CHANGE SENSOR EVERY 10 DAYS   esomeprazole 20 MG capsule Commonly known as: NEXIUM Take 20 mg by mouth daily at 12 noon.   isosorbide mononitrate 30 MG 24 hr tablet Commonly known as: IMDUR Take 1 tablet (30 mg total) by mouth daily.   Lantus SoloStar 100 UNIT/ML Solostar Pen Generic drug: insulin glargine INJECT 70 UNITS INTO THE SKIN AT BEDTIME.   lisinopril 40 MG tablet Commonly known as: ZESTRIL TAKE 1 TABLET BY MOUTH EVERY DAY   nitroGLYCERIN 0.4 MG SL tablet Commonly known as: NITROSTAT Place 1 tablet (0.4 mg total) under the tongue every 5 (five) minutes x 3 doses as needed for chest pain.   NovoLOG FlexPen 100 UNIT/ML FlexPen Generic drug: insulin aspart Inject 16-22 Units into the skin 3 (three) times daily with meals. Sliding scale   Omega-3 1000 MG Caps Take 1,000 mg by mouth daily.   Ozempic (2 MG/DOSE) 8 MG/3ML Sopn Generic drug: Semaglutide (2  MG/DOSE) Inject 2 mg into the skin once a week.   ticagrelor 90 MG Tabs tablet Commonly known as: BRILINTA Take 1 tablet (90 mg total) by mouth 2 (two) times daily.        Discharge Exam: Filed Weights   02/06/23 1953  Weight: 129.7 kg   HEENT:  [place]/AT, No thrush, no icterus CV:  RRR, no rub, no S3, no S4 Lung:  CTA, no wheeze, no rhonchi Abd:  soft/+BS, NT Ext:  No edema, no lymphangitis, no synovitis, no rash   Condition at discharge: stable  The results of significant diagnostics from this hospitalization (including imaging, microbiology, ancillary and laboratory) are listed below for reference.   Imaging Studies: ECHOCARDIOGRAM LIMITED  Result Date: 02/07/2023    ECHOCARDIOGRAM LIMITED REPORT   Patient Name:   Lance [name] Date of Exam: 02/07/2023 Medical Rec #:  [id number]     Height:       71.0 in Accession #:    [id number]    Weight:       286.0 lb Date of Birth:  Mar 01, 1976      BSA:  2.455 m Patient Age:    46 years      BP:           147/77 mmHg Patient Gender: M             HR:           68 bpm. [facility] Procedure: Limited Echo, Limited Color Doppler and Cardiac Doppler Indications:    chest pain  History:        Patient has prior history of Echocardiogram examinations, most                 recent 01/16/2023. CAD, Arrythmias:SVT; Risk                 Factors:Hypertension, Dyslipidemia, Diabetes and Current Smoker.  Sonographer:    [name] RDCS Referring Phys: [id number] OLADAPO ADEFESO  Sonographer Comments: Image acquisition challenging due to patient body habitus. IMPRESSIONS  1. Left ventricular ejection fraction, by estimation, is 60 to 65%. The left ventricle has normal function. The left ventricle demonstrates regional wall motion abnormalities (see scoring diagram/findings for description). The distal apex as detailed in  prior TTE, is akinetic on definity imaging (clip 39, 40). There is mild concentric left ventricular hypertrophy. Left  ventricular diastolic parameters are consistent with Grade I diastolic dysfunction (impaired relaxation).  2. Right ventricular systolic function is normal. The right ventricular size is normal.  3. The mitral valve is grossly normal. No evidence of mitral valve regurgitation.  4. The aortic valve is tricuspid. Aortic valve sclerosis/calcification is present, without any evidence of aortic stenosis.  5. The inferior vena cava is normal in size with greater than 50% respiratory variability, suggesting right atrial pressure of 3 mmHg. Comparison(s): No significant change from prior study. FINDINGS  Left Ventricle: The distal apex, as detailed on prior TTE is akinetic (best seen on clip 39, 40). Left ventricular ejection fraction, by estimation, is 60 to 65%. The left ventricle has normal function. The left ventricle demonstrates regional wall motion abnormalities. Definity contrast agent was given IV to delineate the left ventricular endocardial borders. The left ventricular internal cavity size was normal in size. There is mild concentric left ventricular hypertrophy. Left ventricular diastolic parameters are consistent with Grade I diastolic dysfunction (impaired relaxation). Right Ventricle: The right ventricular size is normal. Right ventricular systolic function is normal. Pericardium: There is no evidence of pericardial effusion. Mitral Valve: The mitral valve is grossly normal. Aortic Valve: The aortic valve is tricuspid. Aortic valve sclerosis/calcification is present, without any evidence of aortic stenosis. Aorta: The aortic root is normal in size and structure. Venous: The inferior vena cava is normal in size with greater than 50% respiratory variability, suggesting right atrial pressure of 3 mmHg. Additional Comments: Spectral Doppler performed. Color Doppler performed.  LEFT VENTRICLE PLAX 2D LVIDd:         4.70 cm Diastology LVIDs:         2.90 cm LV e' medial:    6.53 cm/s LV PW:         1.40 cm LV E/e'  medial:  19.0 LV IVS:        1.20 cm LV e' lateral:   7.18 cm/s                        LV E/e' lateral: 17.3  IVC IVC diam: 1.60 cm LEFT ATRIUM         Index LA diam:  3.80 cm 1.55 cm/m  AORTIC VALVE LVOT Vmax:   85.30 cm/s LVOT Vmean:  53.600 cm/s LVOT VTI:    0.178 m  AORTA Ao Asc diam: 2.80 cm MITRAL VALVE MV Area (PHT): 2.11 cm     SHUNTS MV Decel Time: 359 msec     Systemic VTI: 0.18 m MV E velocity: 124.00 cm/s MV A velocity: 156.00 cm/s MV E/A ratio:  0.79 Laurance Flatten MD Electronically signed by Laurance Flatten MD Signature Date/Time: 02/07/2023/12:34:59 PM    Final    DG Chest 2 View  Result Date: 02/06/2023 CLINICAL DATA:  Chest pain, shortness of breath EXAM: CHEST - 2 VIEW COMPARISON:  01/15/2023 FINDINGS: Transverse diameter of heart is slightly increased. There are no signs of pulmonary edema or focal pulmonary consolidation. There is no pleural effusion or pneumothorax. IMPRESSION: There are no signs of pulmonary edema or focal pulmonary consolidation. Electronically Signed   By: Ernie Avena M.D.   On: 02/06/2023 20:09   CARDIAC CATHETERIZATION  Result Date: 01/16/2023   Mid RCA lesion is 25% stenosed.   Prox LAD lesion is 80% stenosed.   Ost 1st Mrg to 1st Mrg lesion is 20% stenosed.   Dist RCA lesion is 40% stenosed.   Mid Cx lesion is 20% stenosed.   Previously placed Prox Cx stent of unknown type is  widely patent.   Scoring balloon angioplasty was performed using a BALL SAPPHIRE NC24 [id number].   Post intervention, there is a 5% residual stenosis.   The left ventricular systolic function is normal.   LV end diastolic pressure is low. Multivessel CAD with the previously placed proximal LAD stent with diffuse 80% in-stent restenosis. Left circumflex coronary artery has previously placed proximal and OM1 stents with mild 15% narrowing within the OM stent.  There is mid AV groove 20% stenosis. The RCA has previously placed mid stent with 20% smooth in-stent narrowing.  There is  40% distal smooth RCA stenosis. Relatively preserved global LV contractility with LVEDP low at 9 mmHg.  EF estimate approximately 55%. Successful percutaneous coronary intervention with [place] cutting balloon atherotomy with noncompliant dilatation with a 3.75 x 15 mm noncompliant balloon.  The 80% stenosis was reduced to less than 5%. RECOMMENDATION: DAPT with aspirin/Brilinta for minimum of 12 months with probable long-term treatment.  Aggressive lipid-lowering therapy with target LDL less than 55.  Weight loss and optimal blood pressure control are indicated.   ECHOCARDIOGRAM COMPLETE  Result Date: 01/16/2023    ECHOCARDIOGRAM REPORT   Patient Name:   Lance [name] Date of Exam: 01/16/2023 Medical Rec #:  [id number]     Height:       71.0 in Accession #:    [id number]    Weight:       294.5 lb Date of Birth:  [birth year]      BSA:          2.486 m Patient Age:    46 years      BP:           133/64 mmHg Patient Gender: M             HR:           69 bpm. [facility] Procedure: 2D Echo, Cardiac Doppler and Color Doppler Indications:    Chest Pain R07.9  History:        Patient has prior history of Echocardiogram examinations, most  recent 07/26/2019. Cardiomyopathy, CAD; Risk                 Factors:Hypertension, Diabetes and Dyslipidemia. S/P drug                 eluting coronary stent placement, Hx of                 Acute ST elevation myocardial infarction (STEMI) involving left                 main coronary artery. Chronic anticoagulation (Plavix). Morbid                 obesity.  Sonographer:    Celesta Gentile RCS Referring Phys: [id number] ASIA B ZIERLE-GHOSH IMPRESSIONS  1. The very distal apex is dyskinetic. Marland Kitchen Left ventricular ejection fraction, by estimation, is 60 to 65%. The left ventricle has normal function. The left ventricle demonstrates regional wall motion abnormalities (see scoring diagram/findings for description). Left ventricular diastolic parameters are consistent  with Grade I diastolic dysfunction (impaired relaxation). Elevated left atrial pressure.  2. Right ventricular systolic function is normal. The right ventricular size is normal.  3. The mitral valve is normal in structure. No evidence of mitral valve regurgitation. No evidence of mitral stenosis.  4. The aortic valve is tricuspid. There is mild calcification of the aortic valve. There is mild thickening of the aortic valve. Aortic valve regurgitation is not visualized. No aortic stenosis is present.  5. The inferior vena cava is normal in size with greater than 50% respiratory variability, suggesting right atrial pressure of 3 mmHg. FINDINGS  Left Ventricle: The very distal apex is dyskinetic. Left ventricular ejection fraction, by estimation, is 60 to 65%. The left ventricle has normal function. The left ventricle demonstrates regional wall motion abnormalities. Definity contrast agent was given IV to delineate the left ventricular endocardial borders. The left ventricular internal cavity size was normal in size. There is no left ventricular hypertrophy. Left ventricular diastolic parameters are consistent with Grade I diastolic dysfunction (impaired relaxation). Elevated left atrial pressure. Right Ventricle: The right ventricular size is normal. Right vetricular wall thickness was not well visualized. Right ventricular systolic function is normal. Left Atrium: Left atrial size was normal in size. Right Atrium: Right atrial size was normal in size. Pericardium: There is no evidence of pericardial effusion. Mitral Valve: The mitral valve is normal in structure. No evidence of mitral valve regurgitation. No evidence of mitral valve stenosis. MV peak gradient, 8.8 mmHg. The mean mitral valve gradient is 3.0 mmHg. Tricuspid Valve: The tricuspid valve is normal in structure. Tricuspid valve regurgitation is not demonstrated. No evidence of tricuspid stenosis. Aortic Valve: The aortic valve is tricuspid. There is mild  calcification of the aortic valve. There is mild thickening of the aortic valve. There is mild aortic valve annular calcification. Aortic valve regurgitation is not visualized. No aortic stenosis  is present. Aortic valve mean gradient measures 9.0 mmHg. Aortic valve peak gradient measures 17.8 mmHg. Aortic valve area, by VTI measures 1.60 cm. Pulmonic Valve: The pulmonic valve was not well visualized. Pulmonic valve regurgitation is not visualized. No evidence of pulmonic stenosis. Aorta: The aortic root is normal in size and structure. Venous: The inferior vena cava is normal in size with greater than 50% respiratory variability, suggesting right atrial pressure of 3 mmHg. IAS/Shunts: No atrial level shunt detected by color flow Doppler.  LEFT VENTRICLE PLAX 2D LVIDd:         4.80 cm  Diastology LVIDs:         3.10 cm   LV e' medial:    5.66 cm/s LV PW:         1.10 cm   LV E/e' medial:  22.8 LV IVS:        1.00 cm   LV e' lateral:   4.46 cm/s LVOT diam:     2.20 cm   LV E/e' lateral: 28.9 LV SV:         71 LV SV Index:   28 LVOT Area:     3.80 cm  RIGHT VENTRICLE RV S prime:     13.80 cm/s TAPSE (M-mode): 2.1 cm LEFT ATRIUM             Index        RIGHT ATRIUM           Index LA diam:        4.10 cm 1.65 cm/m   RA Area:     13.20 cm LA Vol (A2C):   57.2 ml 23.01 ml/m  RA Volume:   31.40 ml  12.63 ml/m LA Vol (A4C):   84.4 ml 33.96 ml/m LA Biplane Vol: 73.5 ml 29.57 ml/m  AORTIC VALVE AV Area (Vmax):    1.67 cm AV Area (Vmean):   1.81 cm AV Area (VTI):     1.60 cm AV Vmax:           211.00 cm/s AV Vmean:          133.000 cm/s AV VTI:            0.442 m AV Peak Grad:      17.8 mmHg AV Mean Grad:      9.0 mmHg LVOT Vmax:         92.50 cm/s LVOT Vmean:        63.200 cm/s LVOT VTI:          0.186 m LVOT/AV VTI ratio: 0.42  AORTA Ao Root diam: 3.10 cm MITRAL VALVE MV Area (PHT): 1.83 cm     SHUNTS MV Area VTI:   1.22 cm     Systemic VTI:  0.19 m MV Peak grad:  8.8 mmHg     Systemic Diam: 2.20 cm MV Mean  grad:  3.0 mmHg MV Vmax:       1.48 m/s MV Vmean:      79.0 cm/s MV Decel Time: 415 msec MV E velocity: 129.00 cm/s MV A velocity: 153.00 cm/s MV E/A ratio:  0.84 Dina Rich MD Electronically signed by Dina Rich MD Signature Date/Time: 01/16/2023/12:19:27 PM    Final    DG Chest 2 View  Result Date: 01/15/2023 CLINICAL DATA:  Chest and epigastric pain. EXAM: CHEST - 2 VIEW COMPARISON:  03/09/2021. FINDINGS: Heart is enlarged and the mediastinal contour is within normal limits. There is mild atelectasis at the right lung base no consolidation, effusion, or pneumothorax. Mild degenerative changes are noted in the thoracic spine. Surgical clips are present in the upper abdomen. IMPRESSION: Mild atelectasis at the right lung base. Electronically Signed   By: Thornell Sartorius M.D.   On: 01/15/2023 21:18    Microbiology: Results for orders placed or performed during the hospital encounter of 10/05/22  Resp panel by RT-PCR (RSV, Flu A&B, Covid) Anterior Nasal Swab     Status: Abnormal   Collection Time: 10/05/22 10:33 AM   Specimen: Anterior Nasal Swab  Result Value Ref Range Status   SARS Coronavirus 2 by  RT PCR NEGATIVE NEGATIVE Final    Comment: (NOTE) SARS-CoV-2 target nucleic acids are NOT DETECTED.  The SARS-CoV-2 RNA is generally detectable in upper respiratory specimens during the acute phase of infection. The lowest concentration of SARS-CoV-2 viral copies this assay can detect is 138 copies/mL. A negative result does not preclude SARS-Cov-2 infection and should not be used as the sole basis for treatment or other patient management decisions. A negative result may occur with  improper specimen collection/handling, submission of specimen other than nasopharyngeal swab, presence of viral mutation(s) within the areas targeted by this assay, and inadequate number of viral copies(<138 copies/mL). A negative result must be combined with clinical observations, patient history, and  epidemiological information. The expected result is Negative.  Fact Sheet for Patients:  BloggerCourse.com  Fact Sheet for Healthcare Providers:  SeriousBroker.it  This test is no t yet approved or cleared by the [place] FDA and  has been authorized for detection and/or diagnosis of SARS-CoV-2 by FDA under an Emergency Use Authorization (EUA). This EUA will remain  in effect (meaning this test can be used) for the duration of the COVID-19 declaration under Section 564(b)(1) of the Act, 21 U.S.C.section 360bbb-3(b)(1), unless the authorization is terminated  or revoked sooner.       Influenza A by PCR POSITIVE (A) NEGATIVE Final   Influenza B by PCR NEGATIVE NEGATIVE Final    Comment: (NOTE) The Xpert Xpress SARS-CoV-2/FLU/RSV plus assay is intended as an aid in the diagnosis of influenza from Nasopharyngeal swab specimens and should not be used as a sole basis for treatment. Nasal washings and aspirates are unacceptable for Xpert Xpress SARS-CoV-2/FLU/RSV testing.  Fact Sheet for Patients: BloggerCourse.com  Fact Sheet for Healthcare Providers: SeriousBroker.it  This test is not yet approved or cleared by the [place] FDA and has been authorized for detection and/or diagnosis of SARS-CoV-2 by FDA under an Emergency Use Authorization (EUA). This EUA will remain in effect (meaning this test can be used) for the duration of the COVID-19 declaration under Section 564(b)(1) of the Act, 21 U.S.C. section 360bbb-3(b)(1), unless the authorization is terminated or revoked.     Resp Syncytial Virus by PCR NEGATIVE NEGATIVE Final    Comment: (NOTE) Fact Sheet for Patients: BloggerCourse.com  Fact Sheet for Healthcare Providers: SeriousBroker.it  This test is not yet approved or cleared by the [place] FDA and has  been authorized for detection and/or diagnosis of SARS-CoV-2 by FDA under an Emergency Use Authorization (EUA). This EUA will remain in effect (meaning this test can be used) for the duration of the COVID-19 declaration under Section 564(b)(1) of the Act, 21 U.S.C. section 360bbb-3(b)(1), unless the authorization is terminated or revoked.  Performed at [facility], [address] [place], [place] [address]     Labs: CBC: Recent Labs  Lab 02/06/23 2025 02/07/23 0527  WBC 12.4* 11.9*  HGB 13.1 13.3  HCT 38.9* 41.1  MCV 82.2 83.0  PLT 232 230   Basic Metabolic Panel: Recent Labs  Lab 02/06/23 2025 02/07/23 0527  NA 132* 133*  K 3.5 3.9  CL 100 101  CO2 24 26  GLUCOSE 126* 137*  BUN 15 17  CREATININE 1.16 1.09  CALCIUM 8.6* 8.8*  MG  --  2.1  PHOS  --  3.8   Liver Function Tests: Recent Labs  Lab 02/06/23 2025 02/07/23 0527  AST 22 22  ALT 33 31  ALKPHOS 90 87  BILITOT 0.8 1.0  PROT 6.6 6.5  ALBUMIN 3.7 3.5   CBG: No results for input(s): "GLUCAP" in the last 168 hours.  Discharge time spent: greater than 30 minutes.  Signed: Catarina Hartshorn, MD [facility] 02/07/2023

## 2023-02-07 NOTE — Progress Notes (Signed)
  2D Echocardiogram has been performed.  [name] 02/07/2023, 11:41 AM

## 2023-02-07 NOTE — Progress Notes (Signed)
Patient states understanding of discharge instructions.  

## 2023-02-07 NOTE — Progress Notes (Signed)
BP elevated. No c/o pain. Pt slept through the night.

## 2023-02-07 NOTE — Hospital Course (Signed)
48 year old male with a history of hypertension, diabetes mellitus type 2, coronary artery disease presenting with substernal chest discomfort and diaphoresis about 30 minutes prior to admission.  He states that his chest discomfort was substernal and burning in nature.  He stated that it occurred after he took a few bites of food.  He denied any worsening shortness of breath or radiation to his jaw or arms.  Notably, the patient had a recent hospital admission from 01/15/2023 to 01/17/2023 for accelerated angina.  He underwent left heart catheterization on 01/16/2023 which showed multivessel CAD.  There was 80% in-stent stenosis in the proximal LAD.  He underwent balloon atherotomy.  He was instructed to continue his statin, aspirin, and Brilinta.  He endorses compliance with all his medications.  He states that he has been feeling well since his heart catheterization.  He denies any exertional chest discomfort.  He states that he has been walking for about 15 to 20 minutes each day without any chest discomfort since his heart catheterization.  He states that his endurance has been as good as usual.  He took 3 sublingual nitroglycerin with minimal relief.  As result, EMS was activated. In the ED, the patient was afebrile and hemodynamically stable with oxygen saturation 99% room air.  WBC 12.4, hemoglobin 13.1, platelets 232,000.  AST 33, ALT 22, alkaline phosphatase 90, total bilirubin 0.8.  Sodium 132, potassium 3.5, bicarbonate 24, serum creatinine 1.16.  Troponin 9>>10.

## 2023-02-10 NOTE — Telephone Encounter (Signed)
Called and informed pt's wife that the forms had been faxed.

## 2023-02-10 NOTE — Telephone Encounter (Signed)
Wife called to check if patient's FMLA paperwork has been faxed to [facility].

## 2023-02-17 ENCOUNTER — Ambulatory Visit: Payer: 59 | Attending: Cardiology | Admitting: *Deleted

## 2023-02-17 ENCOUNTER — Encounter: Payer: Self-pay | Admitting: *Deleted

## 2023-02-17 VITALS — BP 120/70 | HR 82 | Ht 71.0 in | Wt 290.6 lb

## 2023-02-17 DIAGNOSIS — I10 Essential (primary) hypertension: Secondary | ICD-10-CM | POA: Diagnosis not present

## 2023-02-17 NOTE — Progress Notes (Signed)
Presents for nurse visit per last OV request. Brought a few home BP readings but did not bring home BP cuff. (Will bring back in 3-4 weeks for nurse visit and to check accuracy of his home BP cuff. If BP log does not show well controlled readings, plan to stop lisinopril and switch to losartan/valsartan) Medications reviewed. Reports taking all medications as prescribed without missed doses or side effects. Denies dizziness, chest pain, SOB, headache, or blurred vision.  Vitals taken and routed to provider for review. Home BP readings placed on providers desk.

## 2023-02-17 NOTE — Patient Instructions (Signed)
Your physician recommends that you continue on your current medications as directed. Please refer to the Current Medication list given to you today. Your physician recommends that you schedule a follow-up appointment in: as planned 

## 2023-02-17 NOTE — Progress Notes (Signed)
Visit and home BP readings reviewed by Philis Nettle and said no changes. Continue same plan.

## 2023-03-04 ENCOUNTER — Encounter: Payer: Self-pay | Admitting: "Endocrinology

## 2023-03-04 ENCOUNTER — Ambulatory Visit (INDEPENDENT_AMBULATORY_CARE_PROVIDER_SITE_OTHER): Payer: 59 | Admitting: "Endocrinology

## 2023-03-04 VITALS — BP 130/68 | HR 88 | Ht 71.0 in | Wt 286.0 lb

## 2023-03-04 DIAGNOSIS — F17200 Nicotine dependence, unspecified, uncomplicated: Secondary | ICD-10-CM

## 2023-03-04 DIAGNOSIS — E782 Mixed hyperlipidemia: Secondary | ICD-10-CM

## 2023-03-04 DIAGNOSIS — I10 Essential (primary) hypertension: Secondary | ICD-10-CM

## 2023-03-04 DIAGNOSIS — Z794 Long term (current) use of insulin: Secondary | ICD-10-CM

## 2023-03-04 DIAGNOSIS — E1159 Type 2 diabetes mellitus with other circulatory complications: Secondary | ICD-10-CM | POA: Diagnosis not present

## 2023-03-04 NOTE — Patient Instructions (Signed)

## 2023-03-04 NOTE — Progress Notes (Signed)
03/04/2023  Endocrinology follow-up note  Subjective:    Patient ID: Lance [name], male   DOB: 01-Jul-1976.  Lance [name] is being seen in follow-up for management of currently uncontrolled type 2 diabetes, complicated by coronary artery disease recently which required stent placement, hyperlipidemia, hypertension.   Past Medical History:  Diagnosis Date   CAD (coronary artery disease), native coronary artery    10/18 PCI/DES to mRCA, and OM, with total occlusion of dLCx with collaterals.    Essential hypertension    GERD (gastroesophageal reflux disease)    History of kidney stones    Hyperlipemia    SVT (supraventricular tachycardia) 2015   Converted with adenosine   Type 2 diabetes mellitus (HCC)    Past Surgical History:  Procedure Laterality Date   CARDIAC SURGERY     CORONARY ANGIOPLASTY WITH STENT PLACEMENT  07/15/2017   "2 stents"   CORONARY BALLOON ANGIOPLASTY N/A 01/16/2023   Procedure: CORONARY BALLOON ANGIOPLASTY;  Surgeon: Lennette Bihari, MD;  Location: [facility];  Service: Cardiovascular;  Laterality: N/A;   CORONARY/GRAFT ACUTE MI REVASCULARIZATION N/A 03/12/2019   Procedure: Coronary/Graft Acute MI Revascularization;  Surgeon: Yvonne Kendall, MD;  Location: [facility];  Service: Cardiovascular;  Laterality: N/A;   LAPAROSCOPIC APPENDECTOMY N/A 11/12/2017   Procedure: APPENDECTOMY LAPAROSCOPIC, REPAIR OF INCARCERATED INCISIONAL HERNIA;  Surgeon: Karie Soda, MD;  Location: [facility];  Service: General;  Laterality: N/A;   LAPAROSCOPIC CHOLECYSTECTOMY     LEFT HEART CATH AND CORONARY ANGIOGRAPHY N/A 07/15/2017   Procedure: LEFT HEART CATH AND CORONARY ANGIOGRAPHY;  Surgeon: Tonny Bollman, MD;  Location: [facility] CV [facility];  Service: Cardiovascular;  Laterality: N/A;   LEFT HEART CATH AND CORONARY ANGIOGRAPHY N/A 03/12/2019   Procedure: LEFT HEART CATH AND CORONARY ANGIOGRAPHY;  Surgeon: Yvonne Kendall, MD;  Location: [facility];  Service:  Cardiovascular;  Laterality: N/A;   LEFT HEART CATH AND CORONARY ANGIOGRAPHY N/A 01/16/2023   Procedure: LEFT HEART CATH AND CORONARY ANGIOGRAPHY;  Surgeon: Lennette Bihari, MD;  Location: [facility];  Service: Cardiovascular;  Laterality: N/A;   SVT ABLATION N/A 04/22/2021   Procedure: SVT ABLATION;  Surgeon: Marinus Maw, MD;  Location: [facility];  Service: Cardiovascular;  Laterality: N/A;   Social History   Socioeconomic History   Marital status: Married    Spouse name: Not on file   Number of children: Not on file   Years of education: Not on file   Highest education level: Not on file  Occupational History   Occupation: Superintendent    Employer: [employer]  Tobacco Use   Smoking status: Light Smoker    Packs/day: 0.50    Years: 26.00    Additional pack years: 0.00    Total pack years: 13.00    Types: Cigarettes    Last attempt to quit: 03/07/2019    Years since quitting: 3.9    Passive exposure: Never   Smokeless tobacco: Never  Vaping Use   Vaping Use: Never used  Substance and Sexual Activity   Alcohol use: No   Drug use: No   Sexual activity: Yes    Birth control/protection: None  Other Topics Concern   Not on file  Social History Narrative   Not on file   Social Determinants of Health   Financial Resource Strain: Not on file  Food Insecurity: No Food Insecurity (02/07/2023)   Hunger Vital Sign    Worried About Running Out of  Food in the Last Year: Never true    Ran Out of Food in the Last Year: Never true  Transportation Needs: No Transportation Needs (02/07/2023)   PRAPARE - Administrator, Civil Service (Medical): No    Lack of Transportation (Non-Medical): No  Physical Activity: Not on file  Stress: Not on file  Social Connections: Not on file   Outpatient Encounter Medications as of 03/04/2023  Medication Sig   ALPRAZolam (XANAX) 1 MG tablet Take 1 mg by mouth daily as needed for anxiety.   aspirin EC 81 MG tablet  Take 1 tablet (81 mg total) by mouth daily with breakfast.   atorvastatin (LIPITOR) 80 MG tablet TAKE 1 TABLET BY MOUTH EVERYDAY AT BEDTIME (Patient taking differently: Take 80 mg by mouth at bedtime.)   blood glucose meter kit and supplies KIT 1 each by Does not apply route 4 (four) times daily. Dispense based on patient and insurance preference. Use up to four times daily as directed. (FOR ICD-9 250.00, 250.01).   Continuous Blood Gluc Sensor (DEXCOM G7 SENSOR) MISC CHANGE SENSOR EVERY 10 DAYS   esomeprazole (NEXIUM) 20 MG capsule Take 20 mg by mouth daily at 12 noon.   insulin aspart (NOVOLOG FLEXPEN) 100 UNIT/ML FlexPen Inject 16-22 Units into the skin 3 (three) times daily with meals. Sliding scale   insulin glargine (LANTUS SOLOSTAR) 100 UNIT/ML Solostar Pen INJECT 70 UNITS INTO THE SKIN AT BEDTIME.   Insulin Pen Needle (B-D ULTRAFINE III SHORT PEN) 31G X 8 MM MISC USE TO INJECT INSULIN 4 (FOUR) TIMES DAILY.   isosorbide mononitrate (IMDUR) 30 MG 24 hr tablet Take 1 tablet (30 mg total) by mouth daily.   lisinopril (ZESTRIL) 40 MG tablet TAKE 1 TABLET BY MOUTH EVERY DAY   nitroGLYCERIN (NITROSTAT) 0.4 MG SL tablet Place 1 tablet (0.4 mg total) under the tongue every 5 (five) minutes x 3 doses as needed for chest pain.   Omega-3 1000 MG CAPS Take 1,000 mg by mouth daily.   OZEMPIC, 2 MG/DOSE, 8 MG/3ML SOPN Inject 2 mg into the skin once a week.   ticagrelor (BRILINTA) 90 MG TABS tablet Take 1 tablet (90 mg total) by mouth 2 (two) times daily.   No facility-administered encounter medications on file as of 03/04/2023.   ALLERGIES: Allergies  Allergen Reactions   Bee Venom Anaphylaxis and Swelling   Penicillins Hives, Itching and Rash    Has patient had a PCN reaction causing immediate rash, facial/tongue/throat swelling, SOB or lightheadedness with hypotension: No  Has patient had a PCN reaction causing severe rash involving mucus membranes or skin necrosis: No  Has patient had a PCN  reaction that required hospitalization: No  Has patient had a PCN reaction occurring within the last 10 years: No  If all of the above answers are "NO", then may proceed with Cephalosporin use.  Has patient had a PCN reaction causing immediate rash, facial/tongue/throat swelling, SOB or lightheadedness with hypotension: Yes, Has patient had a PCN reaction causing severe rash involving mucus membranes or skin necrosis: No, Has patient had a PCN reaction that required hospitalization: No, Has patient had a PCN reaction occurring within the last 10 years: No, If all of the above answers are "NO", then may proceed with Cephalosporin use.   VACCINATION STATUS:  There is no immunization history on file for this patient.  Diabetes He presents for his follow-up diabetic visit. He has type 2 diabetes mellitus. Onset time: He was diagnosed at approximate  age of 35 years. His disease course has been stable (Was recently hospitalized due to appendicitis after 2 months of antibiotic treatment.  He is status post appendectomy.  ). There are no hypoglycemic associated symptoms. Pertinent negatives for hypoglycemia include no confusion, headaches, pallor or seizures. Pertinent negatives for diabetes include no blurred vision, no chest pain, no fatigue, no polydipsia, no polyphagia, no polyuria and no weakness. There are no hypoglycemic complications. Symptoms are stable. Diabetic complications include heart disease. Risk factors for coronary artery disease include diabetes mellitus, dyslipidemia, family history, hypertension, male sex, obesity, tobacco exposure and sedentary lifestyle. Current diabetic treatment includes intensive insulin program (He is on Levemir 68 units BID and Humalog 10 units TIDAC.). His weight is fluctuating minimally. He is following a generally unhealthy diet. When asked about meal planning, he reported none. He has not had a previous visit with a dietitian. He rarely participates in exercise.  His home blood glucose trend is decreasing steadily. His breakfast blood glucose range is generally 140-180 mg/dl. His lunch blood glucose range is generally 140-180 mg/dl. His dinner blood glucose range is generally 140-180 mg/dl. His bedtime blood glucose range is generally 140-180 mg/dl. His overall blood glucose range is 140-180 mg/dl. Jill Alexanders presents with his CGM device showing 94% time in range.  No sustained hypoglycemia.  His recent A1c was 7.1%, overall improving.   ) An ACE inhibitor/angiotensin II receptor blocker is being taken. He does not see a podiatrist.Eye exam is not current.  Hyperlipidemia This is a chronic problem. The current episode started more than 1 year ago. The problem is controlled. Exacerbating diseases include diabetes and obesity. Pertinent negatives include no chest pain, myalgias or shortness of breath. Current antihyperlipidemic treatment includes statins and bile acid squestrants. The current treatment provides significant improvement of lipids. Risk factors for coronary artery disease include diabetes mellitus, dyslipidemia, hypertension, male sex, obesity and a sedentary lifestyle.  Hypertension This is a chronic problem. The current episode started more than 1 year ago. Pertinent negatives include no blurred vision, chest pain, headaches, neck pain, palpitations or shortness of breath. Risk factors for coronary artery disease include dyslipidemia, diabetes mellitus, male gender, smoking/tobacco exposure and sedentary lifestyle. Past treatments include ACE inhibitors. Hypertensive end-organ damage includes CAD/MI.   Review of systems  Constitutional: + Minimally fluctuating body weight,  current  Body mass index is 39.89 kg/m. , no fatigue, no subjective hyperthermia, no subjective hypothermia    Objective:    BP 130/68   Pulse 88   Ht 5\' 11"  (1.803 m)   Wt 286 lb (129.7 kg)   BMI 39.89 kg/m   Wt Readings from Last 3 Encounters:  03/04/23 286 lb (129.7  kg)  02/17/23 290 lb 9.6 oz (131.8 kg)  02/06/23 286 lb (129.7 kg)      Physical Exam- Limited  Constitutional:  Body mass index is 39.89 kg/m. , not in acute distress  CMP ( most recent) CMP     Component Value Date/Time   NA 133 (L) 02/07/2023 0527   NA 139 09/22/2022 0756   K 3.9 02/07/2023 0527   CL 101 02/07/2023 0527   CO2 26 02/07/2023 0527   GLUCOSE 137 (H) 02/07/2023 0527   BUN 17 02/07/2023 0527   BUN 16 09/22/2022 0756   CREATININE 1.09 02/07/2023 0527   CREATININE 0.91 04/30/2020 0728   CALCIUM 8.8 (L) 02/07/2023 0527   PROT 6.5 02/07/2023 0527   PROT 6.4 09/22/2022 0756   ALBUMIN 3.5 02/07/2023 0527  ALBUMIN 4.1 09/22/2022 0756   AST 22 02/07/2023 0527   ALT 31 02/07/2023 0527   ALKPHOS 87 02/07/2023 0527   BILITOT 1.0 02/07/2023 0527   BILITOT 0.6 09/22/2022 0756   GFRNONAA >60 02/07/2023 0527   GFRNONAA 102 04/30/2020 0728   GFRAA 118 04/30/2020 0728     Diabetic Labs (most recent): Lab Results  Component Value Date   HGBA1C 7.1 (H) 01/16/2023   HGBA1C 7.2 (A) 10/29/2022   HGBA1C 7.7 (A) 05/29/2022   MICROALBUR 1.0 04/30/2020   MICROALBUR 0.4 10/11/2019   MICROALBUR 0.8 04/19/2018    Lipid Panel     Component Value Date/Time   CHOL 111 09/22/2022 0756   TRIG 197 (H) 09/22/2022 0756   HDL 19 (L) 09/22/2022 0756   CHOLHDL 5.8 (H) 09/22/2022 0756   CHOLHDL 5.5 (H) 10/11/2019 0856   VLDL 47 (H) 03/13/2019 0547   LDLCALC 59 09/22/2022 0756   LDLCALC 63 10/11/2019 0856     Assessment & Plan:   1. Uncontrolled type 2 diabetes mellitus with chronic kidney disease, coronary artery disease, with long-term current use of insulin. Tyrae presents with his CGM device showing 94% time in range.  No sustained hypoglycemia.  His recent A1c was 7.1%, overall improving.    Recent labs reviewed. -his diabetes is complicated by obesity/sedentary life, coronary artery disease which recently required coronary catheterization, chronic heavy smoking and  MANVIK KOZLOFF remains at a high risk for more acute and chronic complications which include CAD, CVA, CKD, retinopathy, and neuropathy. These are all discussed in detail with the patient.  - I have counseled him on diet management and weight loss, by adopting a carbohydrate restricted/protein rich diet. -He is partially disengaged from lifestyle medicine.  He will still benefit from lifestyle medicine.   - he acknowledges that there is a room for improvement in his food and drink choices.  He did not engage so far. - Suggestion is made for him to avoid simple carbohydrates  from his diet including Cakes, Sweet Desserts, Ice Cream, Soda (diet and regular), Sweet Tea, Candies, Chips, Cookies, Store Bought Juices, Alcohol , Artificial Sweeteners,  Coffee Creamer, and "Sugar-free" Products, Lemonade. This will help patient to have more stable blood glucose profile and potentially avoid unintended weight gain.  The following Lifestyle Medicine recommendations according to American College of Lifestyle Medicine  [facility]) were discussed and and offered to patient and he  agrees to start the journey:  A. Whole Foods, Plant-Based Nutrition comprising of fruits and vegetables, plant-based proteins, whole-grain carbohydrates was discussed in detail with the patient.   A list for source of those nutrients were also provided to the patient.  Patient will use only water or unsweetened tea for hydration. B.  The need to stay away from risky substances including alcohol, smoking; obtaining 7 to 9 hours of restorative sleep, at least 150 minutes of moderate intensity exercise weekly, the importance of healthy social connections,  and stress management techniques were discussed. C.  A full color page of  Calorie density of various food groups per pound showing examples of each food groups was provided to the patient.   - he is advised to stick to a routine mealtimes to eat 3 meals  a day and avoid unnecessary snacks (  to snack only to correct hypoglycemia).   - I have approached him with the following individualized plan to manage diabetes and patient agrees:   -Because 2 of his friends have kidney failure, he  decided to stop his metformin.   -He is skeptical of GLP-1 receptor agonists.    Based on his presentation with target glycemic profile, he is advised to continue on his current regimen of insulin and  Ozempic.  H -He is advised to continue Lantus 70 units nightly, continue NovoLog  16  units 3 times daily before meals, associated with strict monitoring of blood glucose 4 times a day-before meals and at bedtime . -He is advised to use his CGM at all times, especially monitoring before meals and at bedtime.   He is advised to continue Ozempic 1 mg subcutaneously weekly.  Side effects and precautions discussed with him.    -Patient is encouraged to call clinic for blood glucose levels less than 70 or above 200 mg /dl.  -He does not want to continue metformin.  - Patient specific target  A1c;  LDL, HDL, Triglycerides,  were discussed in detail.  2) BP/HTN: -His blood pressure is controlled to target.   He is advised to continue  with his current blood pressure medications including lisinopril 40 mg p.o. daily along with his carvedilol 3.125 mg p.o. twice daily.    3) Lipids: His recent lipid panel showed improved and controlled LDL at 59.  He is advised to continue Lipitor 80 mg p.o. daily.  He is also on omega-3 fatty acids 1000 mg p.o. twice daily.  Whole food plant-based diet will help with dyslipidemia.      4)  Weight/Diet: His BMI is 39.89---clearly complicating his diabetes care.  He is a candidate for major weight loss.  CDE Consult has been initiated  , exercise, and detailed carbohydrates information provided.  He is an ideal candidate for bariatric surgery.    5) Chronic Care/Health Maintenance:  -he  is on ACEI/ARB and Statin medications and  is encouraged to continue to follow up  with Ophthalmology, Dentist,  Podiatrist at least yearly or according to recommendations, and advised to  quit smoking. I have recommended yearly flu vaccine and pneumonia vaccination at least every 5 years; moderate intensity exercise for up to 150 minutes weekly; and  sleep for at least 7 hours a day. The patient was counseled on the dangers of tobacco use, and was advised to quit.  Reviewed strategies to maximize success, including removing cigarettes and smoking materials from environment.  - I advised patient to maintain close follow up with Lianne Moris, PA-C for primary care needs.   I spent  41  minutes in the care of the patient today including review of labs from CMP, Lipids, Thyroid Function, Hematology (current and previous including abstractions from other facilities); face-to-face time discussing  his blood glucose readings/logs, discussing hypoglycemia and hyperglycemia episodes and symptoms, medications doses, his options of short and long term treatment based on the latest standards of care / guidelines;  discussion about incorporating lifestyle medicine;  and documenting the encounter. Risk reduction counseling performed per USPSTF guidelines to reduce  obesity and cardiovascular risk factors.     Please refer to Patient Instructions for Blood Glucose Monitoring and Insulin/Medications Dosing Guide"  in media tab for additional information. Please  also refer to " Patient Self Inventory" in the Media  tab for reviewed elements of pertinent patient history.  [name] participated in the discussions, expressed understanding, and voiced agreement with the above plans.  All questions were answered to his satisfaction. he is encouraged to contact clinic should he have any questions or concerns prior to his return visit.  Follow up plan: - Return in about 4 months (around 07/05/2023) for Bring Meter/CGM Device/Logs- A1c in Office.  Marquis Lunch, MD Phone: [phone]  Fax:  [phone]  -  This note was partially dictated with voice recognition software. Similar sounding words can be transcribed inadequately or may not  be corrected upon review.  03/04/2023, 6:28 PM

## 2023-03-10 ENCOUNTER — Other Ambulatory Visit: Payer: Self-pay | Admitting: "Endocrinology

## 2023-03-10 DIAGNOSIS — E1159 Type 2 diabetes mellitus with other circulatory complications: Secondary | ICD-10-CM

## 2023-03-19 ENCOUNTER — Emergency Department (HOSPITAL_COMMUNITY): Payer: 59

## 2023-03-19 ENCOUNTER — Emergency Department (HOSPITAL_COMMUNITY)
Admission: EM | Admit: 2023-03-19 | Discharge: 2023-03-19 | Disposition: A | Discharge status: Home / Self Care | Payer: 59 | Attending: Emergency Medicine | Admitting: Emergency Medicine

## 2023-03-19 ENCOUNTER — Encounter (HOSPITAL_COMMUNITY): Payer: Self-pay | Admitting: Emergency Medicine

## 2023-03-19 ENCOUNTER — Other Ambulatory Visit: Payer: Self-pay

## 2023-03-19 DIAGNOSIS — Z79899 Other long term (current) drug therapy: Secondary | ICD-10-CM | POA: Diagnosis not present

## 2023-03-19 DIAGNOSIS — I2510 Atherosclerotic heart disease of native coronary artery without angina pectoris: Secondary | ICD-10-CM | POA: Diagnosis not present

## 2023-03-19 DIAGNOSIS — Z794 Long term (current) use of insulin: Secondary | ICD-10-CM | POA: Diagnosis not present

## 2023-03-19 DIAGNOSIS — R079 Chest pain, unspecified: Secondary | ICD-10-CM | POA: Insufficient documentation

## 2023-03-19 DIAGNOSIS — I10 Essential (primary) hypertension: Secondary | ICD-10-CM | POA: Insufficient documentation

## 2023-03-19 DIAGNOSIS — Z955 Presence of coronary angioplasty implant and graft: Secondary | ICD-10-CM | POA: Insufficient documentation

## 2023-03-19 DIAGNOSIS — E119 Type 2 diabetes mellitus without complications: Secondary | ICD-10-CM | POA: Diagnosis not present

## 2023-03-19 DIAGNOSIS — Z7982 Long term (current) use of aspirin: Secondary | ICD-10-CM | POA: Insufficient documentation

## 2023-03-19 LAB — TROPONIN I (HIGH SENSITIVITY)
Troponin I (High Sensitivity): 11 ng/L (ref <18)
Troponin I (High Sensitivity): 12 ng/L (ref <18)

## 2023-03-19 LAB — CBC
HCT: 39 % (ref 39.0–52.0)
Hemoglobin: 12.7 g/dL — ABNORMAL LOW (ref 13.0–17.0)
MCH: 27 pg (ref 26.0–34.0)
MCHC: 32.6 g/dL (ref 30.0–36.0)
MCV: 82.8 fL (ref 80.0–100.0)
Platelets: 238 10*3/uL (ref 150–400)
RBC: 4.71 MIL/uL (ref 4.22–5.81)
RDW: 14.5 % (ref 11.5–15.5)
WBC: 13.5 10*3/uL — ABNORMAL HIGH (ref 4.0–10.5)
nRBC: 0 % (ref 0.0–0.2)

## 2023-03-19 LAB — PROTIME-INR
INR: 1 (ref 0.8–1.2)
Prothrombin Time: 12.9 seconds (ref 11.4–15.2)

## 2023-03-19 LAB — BASIC METABOLIC PANEL
Anion gap: 8 (ref 5–15)
BUN: 15 mg/dL (ref 6–20)
CO2: 26 mmol/L (ref 22–32)
Calcium: 8.9 mg/dL (ref 8.9–10.3)
Chloride: 102 mmol/L (ref 98–111)
Creatinine, Ser: 1.25 mg/dL — ABNORMAL HIGH (ref 0.61–1.24)
GFR, Estimated: >60 mL/min (ref >60)
Glucose, Bld: 115 mg/dL — ABNORMAL HIGH (ref 70–99)
Potassium: 4.1 mmol/L (ref 3.5–5.1)
Sodium: 136 mmol/L (ref 135–145)

## 2023-03-19 NOTE — ED Triage Notes (Addendum)
Pt c/o central chest pain x 1 hour, nonradiating, rated 4/10 and described as pressure. Pt started taking ozempic 4 months ago. Hx DM2, HTN, MI, 5 stents. Pt feels SOB, nauseated, and sweaty. Denies dizziness.

## 2023-03-19 NOTE — Discharge Instructions (Signed)
If you have any minor continuing symptoms, follow-up with your cardiologist.  If you have any significant recurrence of your symptoms, return to the ED.

## 2023-03-19 NOTE — ED Provider Notes (Signed)
[facility] AT [facility] Provider Note   CSN: [id number] Arrival date & time: 03/19/23  1832     History  Chief Complaint  Patient presents with   Chest Pain    Lance [name] is a 47 y.o. male.  Patient presents to the emergency department for evaluation of chest pain.  Patient reports a previous history of coronary artery disease, has multiple stents.  Patient reports a pressure in the center of his chest that lasted for just over an hour and then resolved.  This is not similar to cardiac pain he has had in the past.  He reports that he is currently taking Ozempic, recently had some problems with increasing the dose.  He took a shot last night and thinks it may be related.  Patient did have some mild shortness of breath, nausea and felt sweaty.  This has completely resolved.       Home Medications Prior to Admission medications   Medication Sig Start Date End Date Taking? Authorizing Provider  ALPRAZolam Prudy Feeler) 1 MG tablet Take 1 mg by mouth daily as needed for anxiety.    [provider]  aspirin EC 81 MG tablet Take 1 tablet (81 mg total) by mouth daily with breakfast. 04/05/19   Emokpae, Courage, MD  atorvastatin (LIPITOR) 80 MG tablet TAKE 1 TABLET BY MOUTH EVERYDAY AT BEDTIME Patient taking differently: Take 80 mg by mouth at bedtime. 09/09/22   Jonelle Sidle, MD  blood glucose meter kit and supplies KIT 1 each by Does not apply route 4 (four) times daily. Dispense based on patient and insurance preference. Use up to four times daily as directed. (FOR ICD-9 250.00, 250.01). 02/14/20   Nida, Denman George, MD  Continuous Glucose Sensor (DEXCOM G7 SENSOR) MISC CHANGE SENSOR EVERY 10 DAYS 03/10/23   Roma Kayser, MD  esomeprazole (NEXIUM) 20 MG capsule Take 20 mg by mouth daily at 12 noon.    [provider]  insulin aspart (NOVOLOG FLEXPEN) 100 UNIT/ML FlexPen Inject 16-22 Units into the skin 3 (three) times daily with  meals. Sliding scale 10/29/22   Roma Kayser, MD  insulin glargine (LANTUS SOLOSTAR) 100 UNIT/ML Solostar Pen INJECT 70 UNITS INTO THE SKIN AT BEDTIME. 01/06/23   Nida, Denman George, MD  Insulin Pen Needle (B-D ULTRAFINE III SHORT PEN) 31G X 8 MM MISC USE TO INJECT INSULIN 4 (FOUR) TIMES DAILY. 12/10/22   Roma Kayser, MD  isosorbide mononitrate (IMDUR) 30 MG 24 hr tablet Take 1 tablet (30 mg total) by mouth daily. 02/07/23   Catarina Hartshorn, MD  lisinopril (ZESTRIL) 40 MG tablet TAKE 1 TABLET BY MOUTH EVERY DAY 12/05/22   Jonelle Sidle, MD  nitroGLYCERIN (NITROSTAT) 0.4 MG SL tablet Place 1 tablet (0.4 mg total) under the tongue every 5 (five) minutes x 3 doses as needed for chest pain. 09/02/21   Jonelle Sidle, MD  Omega-3 1000 MG CAPS Take 1,000 mg by mouth daily.    [provider]  OZEMPIC, 2 MG/DOSE, 8 MG/3ML SOPN Inject 2 mg into the skin once a week. 01/22/23   [provider]  ticagrelor (BRILINTA) 90 MG TABS tablet Take 1 tablet (90 mg total) by mouth 2 (two) times daily. 01/26/23   Sharlene Dory, NP      Allergies    Bee venom and Penicillins    Review of Systems   Review of Systems  Physical Exam Updated Vital Signs BP (!) 113/54  Pulse 71   Temp 98.1 F (36.7 C) (Oral)   Resp 17   Ht 5\' 11"  (1.803 m)   Wt 127 kg   SpO2 96%   BMI 39.05 kg/m  Physical Exam Vitals and nursing note reviewed.  Constitutional:      General: He is not in acute distress.    Appearance: He is well-developed.  HENT:     Head: Normocephalic and atraumatic.     Mouth/Throat:     Mouth: Mucous membranes are moist.  Eyes:     General: Vision grossly intact. Gaze aligned appropriately.     Extraocular Movements: Extraocular movements intact.     Conjunctiva/sclera: Conjunctivae normal.  Cardiovascular:     Rate and Rhythm: Normal rate and regular rhythm.     Pulses: Normal pulses.     Heart sounds: Normal heart sounds, S1 normal and S2 normal. No murmur  heard.    No friction rub. No gallop.  Pulmonary:     Effort: Pulmonary effort is normal. No respiratory distress.     Breath sounds: Normal breath sounds.  Abdominal:     Palpations: Abdomen is soft.     Tenderness: There is no abdominal tenderness. There is no guarding or rebound.     Hernia: No hernia is present.  Musculoskeletal:        General: No swelling.     Cervical back: Full passive range of motion without pain, normal range of motion and neck supple. No pain with movement, spinous process tenderness or muscular tenderness. Normal range of motion.     Right lower leg: No edema.     Left lower leg: No edema.  Skin:    General: Skin is warm and dry.     Capillary Refill: Capillary refill takes less than 2 seconds.     Findings: No ecchymosis, erythema, lesion or wound.  Neurological:     Mental Status: He is alert and oriented to person, place, and time.     GCS: GCS eye subscore is 4. GCS verbal subscore is 5. GCS motor subscore is 6.     Cranial Nerves: Cranial nerves 2-12 are intact.     Sensory: Sensation is intact.     Motor: Motor function is intact. No weakness or abnormal muscle tone.     Coordination: Coordination is intact.  Psychiatric:        Mood and Affect: Mood normal.        Speech: Speech normal.        Behavior: Behavior normal.     ED Results / Procedures / Treatments   Labs (all labs ordered are listed, but only abnormal results are displayed) Labs Reviewed  BASIC METABOLIC PANEL - Abnormal; Notable for the following components:      Result Value   Glucose, Bld 115 (*)    Creatinine, Ser 1.25 (*)    All other components within normal limits  CBC - Abnormal; Notable for the following components:   WBC 13.5 (*)    Hemoglobin 12.7 (*)    All other components within normal limits  PROTIME-INR  TROPONIN I (HIGH SENSITIVITY)  TROPONIN I (HIGH SENSITIVITY)    EKG EKG Interpretation  Date/Time:  Thursday March 19 2023 23:28:50 EDT Ventricular  Rate:  80 PR Interval:  194 QRS Duration: 95 QT Interval:  399 QTC Calculation: 461 R Axis:   94 Text Interpretation: Sinus rhythm Borderline right axis deviation Low voltage, precordial leads Confirmed by Gilda Crease [phone]) on  03/19/2023 11:30:55 PM  Radiology DG Chest 2 View  Result Date: 03/19/2023 CLINICAL DATA:  Chest pain EXAM: CHEST - 2 VIEW COMPARISON:  02/06/2023 FINDINGS: The heart size and mediastinal contours are within normal limits. Both lungs are clear. The visualized skeletal structures are unremarkable. IMPRESSION: No active cardiopulmonary disease. Electronically Signed   By: Alcide Clever M.D.   On: 03/19/2023 19:46    Procedures Procedures    Medications Ordered in ED Medications - No data to display  ED Course/ Medical Decision Making/ A&P                             Medical Decision Making Amount and/or Complexity of Data Reviewed Labs: ordered. Decision-making details documented in ED Course. Radiology: ordered and independent interpretation performed. Decision-making details documented in ED Course. ECG/medicine tests: ordered and independent interpretation performed. Decision-making details documented in ED Course.   Differential Diagnosis considered includes, but not limited to: STEMI; NSTEMI; myocarditis; pericarditis; pulmonary embolism; aortic dissection; pneumothorax; pneumonia; gastritis; musculoskeletal pain  Presents to the emergency room for evaluation of chest pain.  Patient thinks the symptoms may be related to his Ozempic because it did not resemble his cardiac symptoms.  His discomfort was present for a little more than 1 hour and then spontaneously resolved.  He is back to his baseline currently.  EKG identical to prior EKGs.  Troponin negative x 2.  As patient's symptoms have resolved, he is back to normal and workup is negative, will discharge and have him follow-up with his cardiologist.  Counseled to return for any return  symptoms.        Final Clinical Impression(s) / ED Diagnoses Final diagnoses:  Chest pain, unspecified type    Rx / DC Orders ED Discharge Orders     None         Gilda Crease, MD 03/19/23 2334

## 2023-04-08 ENCOUNTER — Other Ambulatory Visit: Payer: Self-pay | Admitting: "Endocrinology

## 2023-04-16 ENCOUNTER — Emergency Department (HOSPITAL_COMMUNITY)
Admission: EM | Admit: 2023-04-16 | Discharge: 2023-04-16 | Disposition: A | Discharge status: Home / Self Care | Payer: 59 | Attending: Emergency Medicine | Admitting: Emergency Medicine

## 2023-04-16 ENCOUNTER — Emergency Department (HOSPITAL_COMMUNITY): Payer: 59

## 2023-04-16 ENCOUNTER — Other Ambulatory Visit: Payer: Self-pay

## 2023-04-16 ENCOUNTER — Encounter (HOSPITAL_COMMUNITY): Payer: Self-pay

## 2023-04-16 DIAGNOSIS — Z794 Long term (current) use of insulin: Secondary | ICD-10-CM | POA: Insufficient documentation

## 2023-04-16 DIAGNOSIS — Z7982 Long term (current) use of aspirin: Secondary | ICD-10-CM | POA: Insufficient documentation

## 2023-04-16 DIAGNOSIS — T50905A Adverse effect of unspecified drugs, medicaments and biological substances, initial encounter: Secondary | ICD-10-CM

## 2023-04-16 DIAGNOSIS — I110 Hypertensive heart disease with heart failure: Secondary | ICD-10-CM | POA: Insufficient documentation

## 2023-04-16 DIAGNOSIS — Z79899 Other long term (current) drug therapy: Secondary | ICD-10-CM | POA: Diagnosis not present

## 2023-04-16 DIAGNOSIS — T383X5A Adverse effect of insulin and oral hypoglycemic [antidiabetic] drugs, initial encounter: Secondary | ICD-10-CM | POA: Diagnosis not present

## 2023-04-16 DIAGNOSIS — K219 Gastro-esophageal reflux disease without esophagitis: Secondary | ICD-10-CM | POA: Diagnosis not present

## 2023-04-16 DIAGNOSIS — I509 Heart failure, unspecified: Secondary | ICD-10-CM | POA: Diagnosis not present

## 2023-04-16 DIAGNOSIS — I2510 Atherosclerotic heart disease of native coronary artery without angina pectoris: Secondary | ICD-10-CM | POA: Insufficient documentation

## 2023-04-16 DIAGNOSIS — R079 Chest pain, unspecified: Secondary | ICD-10-CM | POA: Diagnosis present

## 2023-04-16 DIAGNOSIS — E119 Type 2 diabetes mellitus without complications: Secondary | ICD-10-CM | POA: Insufficient documentation

## 2023-04-16 DIAGNOSIS — R0789 Other chest pain: Secondary | ICD-10-CM

## 2023-04-16 LAB — CBC
HCT: 37 % — ABNORMAL LOW (ref 39.0–52.0)
Hemoglobin: 12 g/dL — ABNORMAL LOW (ref 13.0–17.0)
MCH: 26.3 pg (ref 26.0–34.0)
MCHC: 32.4 g/dL (ref 30.0–36.0)
MCV: 81.1 fL (ref 80.0–100.0)
Platelets: 249 10*3/uL (ref 150–400)
RBC: 4.56 MIL/uL (ref 4.22–5.81)
RDW: 14.6 % (ref 11.5–15.5)
WBC: 12.1 10*3/uL — ABNORMAL HIGH (ref 4.0–10.5)
nRBC: 0 % (ref 0.0–0.2)

## 2023-04-16 LAB — BASIC METABOLIC PANEL
Anion gap: 7 (ref 5–15)
BUN: 10 mg/dL (ref 6–20)
CO2: 24 mmol/L (ref 22–32)
Calcium: 8.6 mg/dL — ABNORMAL LOW (ref 8.9–10.3)
Chloride: 101 mmol/L (ref 98–111)
Creatinine, Ser: 1.17 mg/dL (ref 0.61–1.24)
GFR, Estimated: >60 mL/min (ref >60)
Glucose, Bld: 217 mg/dL — ABNORMAL HIGH (ref 70–99)
Potassium: 3.6 mmol/L (ref 3.5–5.1)
Sodium: 132 mmol/L — ABNORMAL LOW (ref 135–145)

## 2023-04-16 LAB — TROPONIN I (HIGH SENSITIVITY)
Troponin I (High Sensitivity): 9 ng/L (ref <18)
Troponin I (High Sensitivity): 10 ng/L (ref <18)

## 2023-04-16 LAB — LIPASE, BLOOD: Lipase: 35 U/L (ref 11–51)

## 2023-04-16 MED ORDER — ALUM & MAG HYDROXIDE-SIMETH 200-200-20 MG/5ML PO SUSP
30.0000 mL | Freq: Once | ORAL | Status: AC
  Administered 2023-04-16: 30 mL via ORAL
  Filled 2023-04-16: qty 30

## 2023-04-16 MED ORDER — LIDOCAINE VISCOUS HCL 2 % MT SOLN
15.0000 mL | Freq: Once | OROMUCOSAL | Status: AC
  Administered 2023-04-16: 15 mL via ORAL
  Filled 2023-04-16: qty 15

## 2023-04-16 MED ORDER — PANTOPRAZOLE SODIUM 40 MG PO TBEC
40.0000 mg | DELAYED_RELEASE_TABLET | Freq: Once | ORAL | Status: AC
  Administered 2023-04-16: 40 mg via ORAL
  Filled 2023-04-16: qty 1

## 2023-04-16 MED ORDER — PANTOPRAZOLE SODIUM 40 MG IV SOLR
40.0000 mg | Freq: Once | INTRAVENOUS | Status: DC
  Filled 2023-04-16: qty 10

## 2023-04-16 NOTE — ED Triage Notes (Addendum)
Pt c/o central chest pain that started around lunch. Describes pain as feeling like pressure and is intermittent. Pt states that he started ozempic x5 months ago and does not know if that what its from. Pt with history of MI in 2020.   Pt took 1 nitroglycerin PTA with no relief.

## 2023-04-16 NOTE — Discharge Instructions (Signed)
Thank you for coming to [facility] Emergency Department. You were seen for chest pain. We did an exam, labs, and imaging, and these showed no acute findings.  Please follow up with your primary care provider within 1 week about your ozempic. Please also follow up with your cardiologist about your chest pain. Please continue to take your omeprazole at home.  Do not hesitate to return to the ED or call 911 if you experience: -Worsening symptoms -Recurrent chest pain, shortness of breath -Lightheadedness, passing out -Fevers/chills -Anything else that concerns you

## 2023-04-16 NOTE — ED Provider Notes (Signed)
Lance [name]   CSN: [id number] Arrival date & time: 04/16/23  1907     History  Chief Complaint  Patient presents with   Chest Pain    Lance [name] is a 47 y.o. male with CAD w/ h/o STEMI s/p PCI, h/o SVT/VF, ICM, T2DM on insulin, HTN, HLD, morbid obesity, h/o incarcerated hernia s/p repair 2019, s/p appendectomy, chronic HFimpEF who presents with central non-radiating chest pain that started around lunch. Took ozempic shot this morning. Describes pain as feeling like pressure and is intermittent. A/w nausea no vomiting. Epigastric region. A/w burning and reflux sensation as well. Pt states that he started ozempic x5 months ago and does not know if that what its from. Pt with history of MI in 2020. Does feel similar to prior heart attack. Does take medicine for reflux, but it's not helping. Had reflux prior to starting ozempic. Took one of his nitroglycerin SL but it didn't help. Since starting ozempic he has experienced daily nausea and reflux. Denies SOB, back pain, fevers/chills, cough, urinary symptoms, diarrhea/hematochezia/melena.   .    Chest Pain      Home Medications Prior to Admission medications   Medication Sig Start Date End Date Taking? Authorizing Provider  ALPRAZolam Prudy Feeler) 1 MG tablet Take 1 mg by mouth daily as needed for anxiety.    [provider]  aspirin EC 81 MG tablet Take 1 tablet (81 mg total) by mouth daily with breakfast. 04/05/19   Emokpae, Courage, MD  atorvastatin (LIPITOR) 80 MG tablet TAKE 1 TABLET BY MOUTH EVERYDAY AT BEDTIME Patient taking differently: Take 80 mg by mouth at bedtime. 09/09/22   Jonelle Sidle, MD  blood glucose meter kit and supplies KIT 1 each by Does not apply route 4 (four) times daily. Dispense based on patient and insurance preference. Use up to four times daily as directed. (FOR ICD-9 250.00, 250.01). 02/14/20   Nida, Denman George, MD  Continuous Glucose  Sensor (DEXCOM G7 SENSOR) MISC CHANGE SENSOR EVERY 10 DAYS 03/10/23   Roma Kayser, MD  esomeprazole (NEXIUM) 20 MG capsule Take 20 mg by mouth daily at 12 noon.    [provider]  insulin aspart (NOVOLOG FLEXPEN) 100 UNIT/ML FlexPen Inject 16 Units into the skin 3 (three) times daily with meals. 04/08/23   Roma Kayser, MD  insulin glargine (LANTUS SOLOSTAR) 100 UNIT/ML Solostar Pen INJECT 70 UNITS INTO THE SKIN AT BEDTIME. 01/06/23   Nida, Denman George, MD  Insulin Pen Needle (B-D ULTRAFINE III SHORT PEN) 31G X 8 MM MISC USE TO INJECT INSULIN 4 (FOUR) TIMES DAILY. 12/10/22   Roma Kayser, MD  isosorbide mononitrate (IMDUR) 30 MG 24 hr tablet Take 1 tablet (30 mg total) by mouth daily. 02/07/23   Catarina Hartshorn, MD  lisinopril (ZESTRIL) 40 MG tablet TAKE 1 TABLET BY MOUTH EVERY DAY 12/05/22   Jonelle Sidle, MD  nitroGLYCERIN (NITROSTAT) 0.4 MG SL tablet Place 1 tablet (0.4 mg total) under the tongue every 5 (five) minutes x 3 doses as needed for chest pain. 09/02/21   Jonelle Sidle, MD  Omega-3 1000 MG CAPS Take 1,000 mg by mouth daily.    [provider]  OZEMPIC, 2 MG/DOSE, 8 MG/3ML SOPN Inject 2 mg into the skin once a week. 01/22/23   [provider]  ticagrelor (BRILINTA) 90 MG TABS tablet Take 1 tablet (90 mg total) by mouth 2 (two) times daily. 01/26/23  Sharlene Dory, NP      Allergies    Bee venom and Penicillins    Review of Systems   Review of Systems  Cardiovascular:  Positive for chest pain.   A 10 point review of systems was performed and is negative unless otherwise reported in HPI.  Physical Exam Updated Vital Signs BP (!) 120/57 (BP Location: Left Arm)   Pulse 72   Temp 97.9 F (36.6 C) (Oral)   Resp 15   Ht 5\' 11"  (1.803 m)   Wt 127 kg   SpO2 97%   BMI 39.05 kg/m  Physical Exam General: Normal appearing obese male, lying in bed.  HEENT: Sclera anicteric, MMM, trachea midline.  Cardiology: RRR, no  murmurs/rubs/gallops.  Resp: Normal respiratory rate and effort. CTAB, no wheezes, rhonchi, crackles.  Abd: Soft, non-tender, non-distended. No rebound tenderness or guarding.  GU: Deferred. MSK: No peripheral edema or signs of trauma.  Skin: warm, dry.  Neuro: A&Ox4, CNs II-XII grossly intact. MAEs. Sensation grossly intact.  Psych: Normal mood and affect.   ED Results / Procedures / Treatments   Labs (all labs ordered are listed, but only abnormal results are displayed) Labs Reviewed  BASIC METABOLIC PANEL - Abnormal; Notable for the following components:      Result Value   Sodium 132 (*)    Glucose, Bld 217 (*)    Calcium 8.6 (*)    All other components within normal limits  CBC - Abnormal; Notable for the following components:   WBC 12.1 (*)    Hemoglobin 12.0 (*)    HCT 37.0 (*)    All other components within normal limits  LIPASE, BLOOD  TROPONIN I (HIGH SENSITIVITY)  TROPONIN I (HIGH SENSITIVITY)    EKG None Sinus rhythm Borderline RAD No signs of ischemia  Radiology DG Chest 2 View  Result Date: 04/16/2023 CLINICAL DATA:  Chest pain. EXAM: CHEST - 2 VIEW COMPARISON:  Chest radiograph dated 03/19/2023. FINDINGS: No focal consolidation, pleural effusion or pneumothorax. Top-normal cardiac size. No acute osseous pathology. IMPRESSION: No active cardiopulmonary disease. Electronically Signed   By: Elgie Collard M.D.   On: 04/16/2023 19:49    Procedures Procedures    Medications Ordered in ED Medications  alum & mag hydroxide-simeth (MAALOX/MYLANTA) 200-200-20 MG/5ML suspension 30 mL (30 mLs Oral Given 04/16/23 2130)  lidocaine (XYLOCAINE) 2 % viscous mouth solution 15 mL (15 mLs Oral Given 04/16/23 2130)  pantoprazole (PROTONIX) EC tablet 40 mg (40 mg Oral Given 04/16/23 2130)    ED Course/ Medical Decision Making/ A&P                          Medical Decision Making Amount and/or Complexity of Data Reviewed Labs: ordered. Decision-making details  documented in ED Course. Radiology: ordered.  Risk OTC drugs. Prescription drug management.    This patient presents to the ED for concern of CP, this involves an extensive number of treatment options, and is a complaint that carries with it a high risk of complications and morbidity.  I considered the following differential and admission for this acute, potentially life threatening condition.   MDM:    DDX for chest pain includes but is not limited to:  ACS/arrhythmia,  PE, aortic dissection, PNA, biliary disease though no right upper quadrant tenderness, GERD/PUD/gastritis, or musculoskeletal pain.  Patient does have a very extensive cardiac history and will get troponins to evaluate for ACS, the pain was not exertional and not  relieved by nitro which is reassuring.  Very low suspicion for  aortic dissection given presenting sx. Patient does PERC out for PE.  No abdominal pain and no c/f biliary disease. Consider GERD, given known history. Given report of epigastric pain also consider pancreatitis. Patient relates his symptoms to side effects of ozempic, and he states it's always worse after he takes his injection, he took his injection this morning.  He states his symptoms have resolved which is not unusual.  Clinical Course as of 04/18/23 1528  Thu Apr 16, 2023  2126 WBC(!): 12.1 Chronic [HN]  2126 Basic metabolic panel(!) Unremarkable in the context of this patient's presentation [HN]  2126 Troponin I (High Sensitivity): 9 Initial neg [HN]  2145 Troponin I (High Sensitivity): 10 Flat, neg trop [HN]  2158 Lipase: 35 Neg lipase [HN]  2300 Likely patient with GI related chest pain.  Reflux/gastritis, indigestion. His cardiac w/u is reassuring. I discussed this with patient.  Given his risk factors he is high risk from a chest pain standpoint however he is very certain that his symptoms were related to the Ozempic given the temporal alignment with the injection in his recent GI upset  and trouble with the Ozempic.  His chest pain had resolved by the time I saw him in the ED and he had 0-10 pain, did not notice a difference with the GI cocktail or Protonix as he had no pain to begin with.  I discussed with the patient that he is high risk for cardiac standpoint and he reports understanding but would rather be discharged.  He will follow-up with his cardiologist and I advised he follow-up with his primary care physician about discontinuing the Ozempic, as he states he has been miserable on it.  I advised him continue using his omeprazole at home and to follow-up within 1 to 2 weeks.  Patient reports understanding.  Given discharge instructions and return precautions, all questions answered to patient satisfaction. [HN]    Clinical Course User Index [HN] Loetta Rough, MD    Labs: I Ordered, and personally interpreted labs.  The pertinent results include: Those listed above  Imaging Studies ordered: I ordered imaging studies including CXR I independently visualized and interpreted imaging. I agree with the radiologist interpretation  Additional history obtained from chart review.    Cardiac Monitoring: The patient was maintained on a cardiac monitor.  I personally viewed and interpreted the cardiac monitored which showed an underlying rhythm of: NSR  Reevaluation: After the interventions noted above, I reevaluated the patient and found that they have :resolved  Social Determinants of Health: Patient lives indepedently  Disposition:  DC w/ discharge instructions/return precautions. All questions answered to patient's satisfaction.    Co morbidities that complicate the patient evaluation  Past Medical History:  Diagnosis Date   CAD (coronary artery disease), native coronary artery    10/18 PCI/DES to mRCA, and OM, with total occlusion of dLCx with collaterals.    Essential hypertension    GERD (gastroesophageal reflux disease)    History of kidney stones     Hyperlipemia    SVT (supraventricular tachycardia) 2015   Converted with adenosine   Type 2 diabetes mellitus (HCC)      Medicines Meds ordered this encounter  Medications   DISCONTD: pantoprazole (PROTONIX) injection 40 mg   alum & mag hydroxide-simeth (MAALOX/MYLANTA) 200-200-20 MG/5ML suspension 30 mL   lidocaine (XYLOCAINE) 2 % viscous mouth solution 15 mL   pantoprazole (PROTONIX) EC tablet 40 mg  I have reviewed the patients home medicines and have made adjustments as needed  Problem List / ED Course: Problem List Items Addressed This Visit       Other   Atypical chest pain   Other Visit Diagnoses     Gastroesophageal reflux disease, unspecified whether esophagitis present    -  Primary   Relevant Medications   alum & mag hydroxide-simeth (MAALOX/MYLANTA) 200-200-20 MG/5ML suspension 30 mL (Completed)   pantoprazole (PROTONIX) EC tablet 40 mg (Completed)   Medication side effect, initial encounter                       This [name] was created using dictation software, which may contain spelling or grammatical errors.    Loetta Rough, MD 04/18/23 [phone]

## 2023-04-16 NOTE — ED Notes (Signed)
Patient transported to X-ray 

## 2023-05-29 ENCOUNTER — Other Ambulatory Visit: Payer: Self-pay | Admitting: Cardiology

## 2023-06-20 ENCOUNTER — Other Ambulatory Visit: Payer: Self-pay | Admitting: "Endocrinology

## 2023-06-27 ENCOUNTER — Emergency Department (HOSPITAL_COMMUNITY): Payer: 59

## 2023-06-27 ENCOUNTER — Other Ambulatory Visit: Payer: Self-pay

## 2023-06-27 ENCOUNTER — Emergency Department (HOSPITAL_COMMUNITY)
Admission: EM | Admit: 2023-06-27 | Discharge: 2023-06-27 | Disposition: A | Discharge status: Home / Self Care | Payer: 59 | Attending: Emergency Medicine | Admitting: Emergency Medicine

## 2023-06-27 ENCOUNTER — Encounter (HOSPITAL_COMMUNITY): Payer: Self-pay

## 2023-06-27 DIAGNOSIS — Z79899 Other long term (current) drug therapy: Secondary | ICD-10-CM | POA: Diagnosis not present

## 2023-06-27 DIAGNOSIS — Z794 Long term (current) use of insulin: Secondary | ICD-10-CM | POA: Insufficient documentation

## 2023-06-27 DIAGNOSIS — Z7982 Long term (current) use of aspirin: Secondary | ICD-10-CM | POA: Insufficient documentation

## 2023-06-27 DIAGNOSIS — I2510 Atherosclerotic heart disease of native coronary artery without angina pectoris: Secondary | ICD-10-CM | POA: Diagnosis not present

## 2023-06-27 DIAGNOSIS — E119 Type 2 diabetes mellitus without complications: Secondary | ICD-10-CM | POA: Insufficient documentation

## 2023-06-27 DIAGNOSIS — I10 Essential (primary) hypertension: Secondary | ICD-10-CM | POA: Diagnosis not present

## 2023-06-27 DIAGNOSIS — R1013 Epigastric pain: Secondary | ICD-10-CM | POA: Insufficient documentation

## 2023-06-27 DIAGNOSIS — R079 Chest pain, unspecified: Secondary | ICD-10-CM | POA: Diagnosis not present

## 2023-06-27 LAB — BASIC METABOLIC PANEL
Anion gap: 10 (ref 5–15)
BUN: 12 mg/dL (ref 6–20)
CO2: 23 mmol/L (ref 22–32)
Calcium: 8.7 mg/dL — ABNORMAL LOW (ref 8.9–10.3)
Chloride: 100 mmol/L (ref 98–111)
Creatinine, Ser: 1.03 mg/dL (ref 0.61–1.24)
GFR, Estimated: >60 mL/min (ref >60)
Glucose, Bld: 137 mg/dL — ABNORMAL HIGH (ref 70–99)
Potassium: 4.3 mmol/L (ref 3.5–5.1)
Sodium: 133 mmol/L — ABNORMAL LOW (ref 135–145)

## 2023-06-27 LAB — CBC
HCT: 39.2 % (ref 39.0–52.0)
Hemoglobin: 12.5 g/dL — ABNORMAL LOW (ref 13.0–17.0)
MCH: 25.1 pg — ABNORMAL LOW (ref 26.0–34.0)
MCHC: 31.9 g/dL (ref 30.0–36.0)
MCV: 78.7 fL — ABNORMAL LOW (ref 80.0–100.0)
Platelets: 216 10*3/uL (ref 150–400)
RBC: 4.98 MIL/uL (ref 4.22–5.81)
RDW: 15.2 % (ref 11.5–15.5)
WBC: 13.2 10*3/uL — ABNORMAL HIGH (ref 4.0–10.5)
nRBC: 0 % (ref 0.0–0.2)

## 2023-06-27 LAB — LIPASE, BLOOD: Lipase: 25 U/L (ref 11–51)

## 2023-06-27 LAB — HEPATIC FUNCTION PANEL
ALT: 34 U/L (ref 0–44)
AST: 29 U/L (ref 15–41)
Albumin: 3.8 g/dL (ref 3.5–5.0)
Alkaline Phosphatase: 83 U/L (ref 38–126)
Bilirubin, Direct: 0.2 mg/dL (ref 0.0–0.2)
Indirect Bilirubin: 1 mg/dL — ABNORMAL HIGH (ref 0.3–0.9)
Total Bilirubin: 1.2 mg/dL (ref 0.3–1.2)
Total Protein: 6.8 g/dL (ref 6.5–8.1)

## 2023-06-27 LAB — TROPONIN I (HIGH SENSITIVITY)
Troponin I (High Sensitivity): 15 ng/L (ref <18)
Troponin I (High Sensitivity): 16 ng/L (ref <18)

## 2023-06-27 MED ORDER — DICYCLOMINE HCL 20 MG PO TABS: 20.0000 mg | ORAL_TABLET | Freq: Two times a day (BID) | ORAL | 0 refills | Status: DC

## 2023-06-27 MED ORDER — FAMOTIDINE IN NACL 20-0.9 MG/50ML-% IV SOLN
20.0000 mg | Freq: Once | INTRAVENOUS | Status: AC
  Administered 2023-06-27: 20 mg via INTRAVENOUS
  Filled 2023-06-27: qty 50

## 2023-06-27 NOTE — ED Triage Notes (Signed)
Pt c/o epigastric chest pain intermittently with cold sweats since last night. No n/v but has a hx of MI.

## 2023-06-27 NOTE — Discharge Instructions (Signed)
Today for pain in her abdomen also having some sweating.  Your workup was all very reassuring.  Your troponin tests were normal.  Your white blood cell count was slightly elevated which has been the past several times you have blood work.  Follow your PCP for this.  Since you are having persistent problems with pain in your upper abdomen you to follow-up with GI as well.  You can try the Bentyl.  Also follow-up with your heart doctor.

## 2023-06-27 NOTE — ED Provider Notes (Signed)
[place] EMERGENCY DEPARTMENT AT [facility] Provider Note   CSN: [id number] Arrival date & time: 06/27/23  2130     History  Chief Complaint  Patient presents with   Chest Pain    DIMETRI DRUCKMAN is a 47 y.o. male.  Has PMH of CAD with history of STEMI status post PCI, diabetes on insulin, hypertension, hyperlipidemia, GERD, morbid obesity and history of SVT.   Presents the ER today complaining of epigastric pain since last night with intermittent cold sweats, no chest pain, no vomiting.  He states been having this problem on and off since he started Ozempic.  Last shot was 2 months ago, he states he was been in the ER couple of times for the same complaint with reassuring workup.  States he tried to be evaluated due to his cardiac history.   Patient had a left heart cath April 2024 which showed two-vessel CAD with previously placed stent in proximal LAD 80% restenosed, with PCI with balloon atherotomy reducing stenosis to less than 5% and put back on aspirin below Brilinta therapy  Chest Pain      Home Medications Prior to Admission medications   Medication Sig Start Date End Date Taking? Authorizing Provider  aspirin EC 81 MG tablet Take 1 tablet (81 mg total) by mouth daily with breakfast. 04/05/19  Yes Emokpae, Courage, MD  atorvastatin (LIPITOR) 80 MG tablet TAKE 1 TABLET BY MOUTH EVERYDAY AT BEDTIME Patient taking differently: Take 80 mg by mouth at bedtime. 09/09/22  Yes Jonelle Sidle, MD  dicyclomine (BENTYL) 20 MG tablet Take 1 tablet (20 mg total) by mouth 2 (two) times daily. 06/27/23  Yes Nadia Torr A, PA-C  famotidine (PEPCID) 40 MG tablet Take 40 mg by mouth daily. 04/30/23  Yes [provider]  insulin aspart (NOVOLOG FLEXPEN) 100 UNIT/ML FlexPen INJECT 16 UNITS INTO THE SKIN 3 (THREE) TIMES DAILY WITH MEALS. 06/23/23  Yes Nida, Denman George, MD  insulin glargine (LANTUS SOLOSTAR) 100 UNIT/ML Solostar Pen INJECT 70 UNITS INTO THE SKIN AT  BEDTIME. 01/06/23  Yes Nida, Denman George, MD  lisinopril (ZESTRIL) 40 MG tablet TAKE 1 TABLET BY MOUTH EVERY DAY 05/29/23  Yes Jonelle Sidle, MD  nitroGLYCERIN (NITROSTAT) 0.4 MG SL tablet Place 1 tablet (0.4 mg total) under the tongue every 5 (five) minutes x 3 doses as needed for chest pain. 09/02/21  Yes Jonelle Sidle, MD  omeprazole (PRILOSEC) 40 MG capsule Take 40 mg by mouth daily. 05/18/23  Yes [provider]  ticagrelor (BRILINTA) 90 MG TABS tablet Take 1 tablet (90 mg total) by mouth 2 (two) times daily. 01/26/23  Yes Sharlene Dory, NP  blood glucose meter kit and supplies KIT 1 each by Does not apply route 4 (four) times daily. Dispense based on patient and insurance preference. Use up to four times daily as directed. (FOR ICD-9 250.00, 250.01). 02/14/20   Roma Kayser, MD  Continuous Glucose Sensor (DEXCOM G7 SENSOR) MISC CHANGE SENSOR EVERY 10 DAYS 03/10/23   Roma Kayser, MD  Insulin Pen Needle (B-D ULTRAFINE III SHORT PEN) 31G X 8 MM MISC USE TO INJECT INSULIN 4 (FOUR) TIMES DAILY. 12/10/22   Roma Kayser, MD  OZEMPIC, 1 MG/DOSE, 4 MG/3ML SOPN Inject 1 mg into the skin once a week. Patient not taking: Reported on 06/27/2023 03/17/23   [provider]      Allergies    Bee venom and Penicillins    Review of Systems  Review of Systems  Cardiovascular:  Positive for chest pain.    Physical Exam Updated Vital Signs BP (!) 111/47   Pulse 64   Temp 98 F (36.7 C) (Oral)   Resp 18   Ht 5\' 11"  (1.803 m)   Wt 127 kg   SpO2 97%   BMI 39.05 kg/m  Physical Exam Vitals and nursing note reviewed.  Constitutional:      General: He is not in acute distress.    Appearance: He is well-developed.  HENT:     Head: Normocephalic and atraumatic.  Eyes:     Conjunctiva/sclera: Conjunctivae normal.  Cardiovascular:     Rate and Rhythm: Normal rate and regular rhythm.     Heart sounds: No murmur heard. Pulmonary:     Effort:  Pulmonary effort is normal. No respiratory distress.     Breath sounds: Normal breath sounds.  Chest:     Chest wall: No edema.  Abdominal:     Palpations: Abdomen is soft.     Tenderness: There is no abdominal tenderness.  Musculoskeletal:        General: No swelling.     Cervical back: Normal range of motion and neck supple.     Right lower leg: No tenderness. No edema.     Left lower leg: No tenderness. No edema.  Skin:    General: Skin is warm and dry.     Capillary Refill: Capillary refill takes less than 2 seconds.  Neurological:     General: No focal deficit present.     Mental Status: He is alert.  Psychiatric:        Mood and Affect: Mood normal.     ED Results / Procedures / Treatments   Labs (all labs ordered are listed, but only abnormal results are displayed) Labs Reviewed  BASIC METABOLIC PANEL - Abnormal; Notable for the following components:      Result Value   Sodium 133 (*)    Glucose, Bld 137 (*)    Calcium 8.7 (*)    All other components within normal limits  CBC - Abnormal; Notable for the following components:   WBC 13.2 (*)    Hemoglobin 12.5 (*)    MCV 78.7 (*)    MCH 25.1 (*)    All other components within normal limits  HEPATIC FUNCTION PANEL - Abnormal; Notable for the following components:   Indirect Bilirubin 1.0 (*)    All other components within normal limits  LIPASE, BLOOD  TROPONIN I (HIGH SENSITIVITY)  TROPONIN I (HIGH SENSITIVITY)    EKG EKG Interpretation Date/Time:  Saturday June 27 2023 09:09:03 EDT Ventricular Rate:  61 PR Interval:  188 QRS Duration:  93 QT Interval:  415 QTC Calculation: 418 R Axis:   93  Text Interpretation: Sinus rhythm Borderline right axis deviation No significant change since last tracing Confirmed by Vanetta Mulders [phone]) on 06/27/2023 9:27:51 AM  Radiology DG Chest 2 View  Result Date: 06/27/2023 CLINICAL DATA:  47 year old male with history of intermittent chest pain and diaphoresis.  EXAM: CHEST - 2 VIEW COMPARISON:  Chest x-ray 04/16/2023. FINDINGS: Lung volumes are normal. No consolidative airspace disease. No pleural effusions. No pneumothorax. No pulmonary nodule or mass noted. Pulmonary vasculature and the cardiomediastinal silhouette are within normal limits. IMPRESSION: No radiographic evidence of acute cardiopulmonary disease. Electronically Signed   By: Trudie Reed M.D.   On: 06/27/2023 09:49    Procedures Procedures    Medications Ordered in ED Medications  famotidine (PEPCID) IVPB 20 mg premix (0 mg Intravenous Stopped 06/27/23 1212)    ED Course/ Medical Decision Making/ A&P                                 Medical Decision Making This patient presents to the ED for concern of chest pain and epigastric pain, this involves an extensive number of treatment options, and is a complaint that carries with it a high risk of complications and morbidity.  The differential diagnosis includes but is not limited to ACS, PE, aortic dissection, pneumonia, pneumothorax, pericarditis, pancreatitis, GERD, esophagitis, peptic ulcer disease,    Co morbidities that complicate the patient evaluation :   Heart disease, diabetes   Additional history obtained: From EMR including labs and notes     Imaging Studies ordered:  I ordered imaging studies including x-ray which shows no pulmonary edema or infiltrate I independently visualized and interpreted imaging within scope of identifying emergent findings  I agree with the radiologist interpretation   Cardiac Monitoring: / EKG:  The patient was maintained on a cardiac monitor.  I personally viewed and interpreted the cardiac monitored which showed an underlying rhythm of: Normal sinus rhythm    Problem List / ED Course / Critical interventions / Medication management  Patient presents with epigastric pain that has been ongoing since last night he has had this problem intermittently for the past couple of months.   It is thought to be due to his Ozempic but he stopped it 2 months ago and has had some recurrent symptoms.  He denies any chest pain, syncope or palpitations, EKG is reassuring.  He is concerned about his heart because he has had some sweats and does have significant CAD.  Had cath in April with angioplasty.  He has been compliant with his Brilinta and aspirin.  Will obtain CBC, CMP, lipase, EKG and troponin series, give patient an antacid as he feels this is similar to prior GERD and reassess.  PE is low risk by Wells criteria, ruled out by negative PERC criteria, was EKG are not consistent with pericarditis/pericardial effusion, I very low suspicion for aortic dissection given normal blood pressure, lack of radiation of the pain, appearance with patient and intermittent symptoms.  He states it does not feel like prior heart attack, EKG is normal.    Chest x-ray already formed, reviewed and interpreted by me shows no pulmonary edema or infiltrate, no pneumothorax, no change in cardiac silhouette and no mediastinal widening on my interpretation, agree with radiology read  Patient's symptoms have resolved in the ED, he has a scheduled follow-up with his PCP this coming week, given Bentyl for symptoms advised on small frequent meals, advised on GI follow-up and allergy follow-up as well.     Amount and/or Complexity of Data Reviewed Labs: ordered. Radiology: ordered.  Risk Prescription drug management.           Final Clinical Impression(s) / ED Diagnoses Final diagnoses:  Epigastric pain    Rx / DC Orders ED Discharge Orders          Ordered    dicyclomine (BENTYL) 20 MG tablet  2 times daily        06/27/23 1409              Josem Kaufmann 06/27/23 1919    Vanetta Mulders, MD 06/28/23 [phone]

## 2023-07-06 ENCOUNTER — Telehealth (INDEPENDENT_AMBULATORY_CARE_PROVIDER_SITE_OTHER): Payer: Self-pay | Admitting: Gastroenterology

## 2023-07-06 ENCOUNTER — Ambulatory Visit (INDEPENDENT_AMBULATORY_CARE_PROVIDER_SITE_OTHER): Payer: 59 | Admitting: Gastroenterology

## 2023-07-06 ENCOUNTER — Encounter (INDEPENDENT_AMBULATORY_CARE_PROVIDER_SITE_OTHER): Payer: Self-pay | Admitting: Gastroenterology

## 2023-07-06 VITALS — BP 133/77 | HR 78 | Temp 97.9°F | Ht 71.0 in | Wt 286.0 lb

## 2023-07-06 DIAGNOSIS — R1013 Epigastric pain: Secondary | ICD-10-CM | POA: Diagnosis not present

## 2023-07-06 DIAGNOSIS — D649 Anemia, unspecified: Secondary | ICD-10-CM

## 2023-07-06 NOTE — Telephone Encounter (Signed)
Pt would like to know if he can go back on Ozempic. Pt was told by PCP to ask GI if he could go back on Ozempic. Please advise. Thank you.

## 2023-07-06 NOTE — Progress Notes (Signed)
[name] , M.D. Gastroenterology & Hepatology [facility] [address] [place], [place] [address] Primary Care Physician: Lianne Moris, PA-C [address] [place] [address]  Chief Complaint:  Abdominal pain   History of Present Illness: Lance [name] is a 47 y.o. male with CAD with history of STEMI status post PCI, diabetes on insulin, hypertension, hyperlipidemia, GERD, morbid obesity and history of SVT.  who presents for evaluation of Abdominal pain   The patient reports epigastric pain for which she has been to the ER multiple times in the past month.  He reports sudden onset epigastric pain nonradiating no aggravating or relieving factor.  Patient reports he feels sore .he mentions that this issue has been recurring intermittently since he started Ozempic, with his last injection being two months ago. He has visited the ER multiple times for the same complaint, with reassuring evaluations each time. He sought evaluation due to his history of cardiac issues.  Patient reports the pain seems to improve slightly with omeprazole and Pepcid.  Denies taking any NSAIDs just takes Tylenol for any pain. The patient denies having any nausea, vomiting, fever, chills, hematochezia, melena, hematemesis, a jaundice, pruritus or weight loss.  Last YWV:PXTG Last Colonoscopy:None  FHx: neg for any gastrointestinal/liver disease, no malignancies Social: Current Smoker  surgical: Appendectomy and cholecystectomy  Past Medical History: Past Medical History:  Diagnosis Date   CAD (coronary artery disease), native coronary artery    10/18 PCI/DES to mRCA, and OM, with total occlusion of dLCx with collaterals.    Essential hypertension    GERD (gastroesophageal reflux disease)    History of kidney stones    Hyperlipemia    MI (myocardial infarction) (HCC) 2020   SVT (supraventricular tachycardia) 2015   Converted with adenosine   Type 2 diabetes  mellitus (HCC)     Past Surgical History: Past Surgical History:  Procedure Laterality Date   CARDIAC SURGERY     CORONARY ANGIOPLASTY WITH STENT PLACEMENT  07/15/2017   "2 stents"   CORONARY BALLOON ANGIOPLASTY N/A 01/16/2023   Procedure: CORONARY BALLOON ANGIOPLASTY;  Surgeon: Lennette Bihari, MD;  Location: [facility] INVASIVE CV LAB;  Service: Cardiovascular;  Laterality: N/A;   CORONARY/GRAFT ACUTE MI REVASCULARIZATION N/A 03/12/2019   Procedure: Coronary/Graft Acute MI Revascularization;  Surgeon: Yvonne Kendall, MD;  Location: [facility] INVASIVE CV LAB;  Service: Cardiovascular;  Laterality: N/A;   LAPAROSCOPIC APPENDECTOMY N/A 11/12/2017   Procedure: APPENDECTOMY LAPAROSCOPIC, REPAIR OF INCARCERATED INCISIONAL HERNIA;  Surgeon: Karie Soda, MD;  Location: [facility] ORS;  Service: General;  Laterality: N/A;   LAPAROSCOPIC CHOLECYSTECTOMY     LEFT HEART CATH AND CORONARY ANGIOGRAPHY N/A 07/15/2017   Procedure: LEFT HEART CATH AND CORONARY ANGIOGRAPHY;  Surgeon: Tonny Bollman, MD;  Location: [facility] INVASIVE CV LAB;  Service: Cardiovascular;  Laterality: N/A;   LEFT HEART CATH AND CORONARY ANGIOGRAPHY N/A 03/12/2019   Procedure: LEFT HEART CATH AND CORONARY ANGIOGRAPHY;  Surgeon: Yvonne Kendall, MD;  Location: [facility] INVASIVE CV LAB;  Service: Cardiovascular;  Laterality: N/A;   LEFT HEART CATH AND CORONARY ANGIOGRAPHY N/A 01/16/2023   Procedure: LEFT HEART CATH AND CORONARY ANGIOGRAPHY;  Surgeon: Lennette Bihari, MD;  Location: [facility] INVASIVE CV LAB;  Service: Cardiovascular;  Laterality: N/A;   SVT ABLATION N/A 04/22/2021   Procedure: SVT ABLATION;  Surgeon: Marinus Maw, MD;  Location: [facility] INVASIVE CV LAB;  Service: Cardiovascular;  Laterality: N/A;    Family History: Family History  Problem Relation  Age of Onset   Heart disease Mother 40       CABG   Diabetes Mother    Gout Mother    Heart attack Father 31    Social History: Social History   Tobacco Use  Smoking Status Light Smoker   Current  packs/day: 0.00   Average packs/day: 0.5 packs/day for 26.0 years (13.0 ttl pk-yrs)   Types: Cigarettes   Start date: 03/06/1993   Last attempt to quit: 03/07/2019   Years since quitting: 4.3   Passive exposure: Never  Smokeless Tobacco Never   Social History   Substance and Sexual Activity  Alcohol Use No   Social History   Substance and Sexual Activity  Drug Use No    Allergies: Allergies  Allergen Reactions   Bee Venom Anaphylaxis and Swelling   Penicillins Hives, Itching and Rash    Medications: Current Outpatient Medications  Medication Sig Dispense Refill   aspirin EC 81 MG tablet Take 1 tablet (81 mg total) by mouth daily with breakfast. 120 tablet 5   atorvastatin (LIPITOR) 80 MG tablet TAKE 1 TABLET BY MOUTH EVERYDAY AT BEDTIME (Patient taking differently: Take 80 mg by mouth at bedtime.) 90 tablet 3   blood glucose meter kit and supplies KIT 1 each by Does not apply route 4 (four) times daily. Dispense based on patient and insurance preference. Use up to four times daily as directed. (FOR ICD-9 250.00, 250.01). 1 each 0   Continuous Glucose Sensor (DEXCOM G7 SENSOR) MISC CHANGE SENSOR EVERY 10 DAYS 9 each 1   famotidine (PEPCID) 40 MG tablet Take 40 mg by mouth daily.     insulin aspart (NOVOLOG FLEXPEN) 100 UNIT/ML FlexPen INJECT 16 UNITS INTO THE SKIN 3 (THREE) TIMES DAILY WITH MEALS. 45 mL 0   insulin glargine (LANTUS SOLOSTAR) 100 UNIT/ML Solostar Pen INJECT 70 UNITS INTO THE SKIN AT BEDTIME. 75 mL 1   Insulin Pen Needle (B-D ULTRAFINE III SHORT PEN) 31G X 8 MM MISC USE TO INJECT INSULIN 4 (FOUR) TIMES DAILY. 400 each 2   lisinopril (ZESTRIL) 40 MG tablet TAKE 1 TABLET BY MOUTH EVERY DAY 90 tablet 1   nitroGLYCERIN (NITROSTAT) 0.4 MG SL tablet Place 1 tablet (0.4 mg total) under the tongue every 5 (five) minutes x 3 doses as needed for chest pain. 25 tablet 3   omeprazole (PRILOSEC) 40 MG capsule Take 40 mg by mouth daily.     ticagrelor (BRILINTA) 90 MG TABS tablet  Take 1 tablet (90 mg total) by mouth 2 (two) times daily. 60 tablet 5   dicyclomine (BENTYL) 20 MG tablet Take 1 tablet (20 mg total) by mouth 2 (two) times daily. (Patient not taking: Reported on 07/06/2023) 20 tablet 0   OZEMPIC, 1 MG/DOSE, 4 MG/3ML SOPN Inject 1 mg into the skin once a week. (Patient not taking: Reported on 06/27/2023)     No current facility-administered medications for this visit.    Review of Systems: GENERAL: negative for malaise, night sweats HEENT: No changes in hearing or vision, no nose bleeds or other nasal problems. NECK: Negative for lumps, goiter, pain and significant neck swelling RESPIRATORY: Negative for cough, wheezing CARDIOVASCULAR: Negative for chest pain, leg swelling, palpitations, orthopnea GI: SEE HPI MUSCULOSKELETAL: Negative for joint pain or swelling, back pain, and muscle pain. SKIN: Negative for lesions, rash HEMATOLOGY Negative for prolonged bleeding, bruising easily, and swollen nodes. ENDOCRINE: Negative for cold or heat intolerance, polyuria, polydipsia and goiter. NEURO: negative for tremor, gait imbalance,  syncope and seizures. The remainder of the review of systems is noncontributory.   Physical Exam: BP 133/77   Pulse 78   Temp 97.9 F (36.6 C) (Oral)   Ht 5\' 11"  (1.803 m)   Wt 286 lb (129.7 kg)   BMI 39.89 kg/m  GENERAL: The patient is AO x3, in no acute distress. HEENT: Head is normocephalic and atraumatic. EOMI are intact. Mouth is well hydrated and without lesions. NECK: Supple. No masses LUNGS: Clear to auscultation. No presence of rhonchi/wheezing/rales. Adequate chest expansion HEART: RRR, normal s1 and s2. ABDOMEN: Soft, epigastric tenderness, no guarding, no peritoneal signs, and nondistended. BS +. No masses. EXTREMITIES: Without any cyanosis, clubbing, rash, lesions or edema. NEUROLOGIC: AOx3, no focal motor deficit. SKIN: no jaundice, no rashes   Imaging/Labs: as above     Latest Ref Rng & Units 06/27/2023    10:01 AM 04/16/2023    7:23 PM 03/19/2023    7:08 PM  CBC  WBC 4.0 - 10.5 K/uL 13.2  12.1  13.5   Hemoglobin 13.0 - 17.0 g/dL 16.1  09.6  04.5   Hematocrit 39.0 - 52.0 % 39.2  37.0  39.0   Platelets 150 - 400 K/uL 216  249  238    No results found for: "IRON", "TIBC", "FERRITIN"  I personally reviewed and interpreted the available labs, imaging and endoscopic files.  1. Near complete interval resolution of inflammatory changes involving the appendix. Appendiceal diameter now measures 8 mm and periappendiceal stranding has almost completely resolved. No developing abscess. 2. No evidence of bowel obstruction or inflammation. 3. Small left inguinal hernia containing fat.  12 point platelets 216 normal liver enzymes  Impression and Plan:  MERRELL BORSUK is a 47 y.o. male with CAD with history of STEMI status post PCI, diabetes on insulin, hypertension, hyperlipidemia, GERD, morbid obesity and history of SVT.  who presents for evaluation of Abdominal pain   #Abdominal pain #Dyspepsia  This could be acid mediated or H. pylori mediated dyspepsia or disease. given patient has significant vascular disease it could be chronic mesenteric ischemia.  This also could be side effect of Ozempic which is well-controlled with nausea and abdominal pain which started only after patient dose was increased  Patient has been to the ER few times in the past more than for similar epigastric pain.  Patient last cardiac stent was placed few months ago and is on dual antiplatelet therapy   Will obtain complete abdominal ultrasound  Discussed with patient given persistent pain and epigastric tenderness we will proceed with diagnostic upper endoscopy without much intervention such as polypectomy but may take biopsy as patient needs to continue his dual antiplatelet therapy given recent cardiac stents.  Again discussed with patient that this endoscopy would be diagnostic to rule out any underlying lesion such  as peptic ulcer and if any polyps are encountered would not be removed and patient may need repeat procedure at in future  Recommend omeprazole 40 mg daily 30 minutes before breakfast  Avoid all NSAIDs  If pain continues and upper endoscopy and ultrasound does not explain abdominal pain then CT angio abdomen will be recommended at that time to evaluate for vasculature to rule out chronic mesenteric ischemia  #Normocytic Anemia Patient noted to have new onset normocytic anemia without any overt GI bleed Will obtain ferritin level to assess for any iron deficiency anemia  #HCM: Patient never had a colonoscopy.  Given recent stents to her antiplatelet therapy should be continued and  hence will defer colonoscopy until next year when he can safely temporarily go on mono- antiplatelet therapy.  All questions were answered.      [name], MD Gastroenterology and Hepatology [facility]   This chart has been completed using [facility] Dictation software, and while attempts have been made to ensure accuracy , certain words and phrases may not be transcribed as intended

## 2023-07-06 NOTE — Telephone Encounter (Signed)
Yes please he can restart ozempic on low dose as tolerated , but again would hold it before the procedure

## 2023-07-06 NOTE — Patient Instructions (Addendum)
It was very nice to meet you today, as dicussed with will plan for the following :  1) Abdominal ultrasound  2) Upper endoscopy   3) blood work

## 2023-07-07 ENCOUNTER — Encounter (INDEPENDENT_AMBULATORY_CARE_PROVIDER_SITE_OTHER): Payer: Self-pay

## 2023-07-07 NOTE — Telephone Encounter (Signed)
Left message to return call. Also need to inform pt of ultrasound appt.

## 2023-07-09 NOTE — Telephone Encounter (Signed)
Pt contacted and verbalized understanding. Advised pt that he could start back on Ozempic would need to hold in it for 7 days prior to procedure. Advised pt of [place] appt. Will send updated instructions with directions to hold Ozempic

## 2023-07-13 ENCOUNTER — Encounter: Payer: Self-pay | Admitting: "Endocrinology

## 2023-07-13 ENCOUNTER — Ambulatory Visit (INDEPENDENT_AMBULATORY_CARE_PROVIDER_SITE_OTHER): Payer: 59 | Admitting: "Endocrinology

## 2023-07-13 ENCOUNTER — Ambulatory Visit (HOSPITAL_COMMUNITY): Payer: 59

## 2023-07-13 VITALS — BP 142/66 | HR 80 | Ht 71.0 in | Wt 290.2 lb

## 2023-07-13 DIAGNOSIS — Z794 Long term (current) use of insulin: Secondary | ICD-10-CM

## 2023-07-13 DIAGNOSIS — I10 Essential (primary) hypertension: Secondary | ICD-10-CM | POA: Diagnosis not present

## 2023-07-13 DIAGNOSIS — E782 Mixed hyperlipidemia: Secondary | ICD-10-CM

## 2023-07-13 DIAGNOSIS — E1159 Type 2 diabetes mellitus with other circulatory complications: Secondary | ICD-10-CM | POA: Diagnosis not present

## 2023-07-13 LAB — POCT GLYCOSYLATED HEMOGLOBIN (HGB A1C)

## 2023-07-13 MED ORDER — OZEMPIC (0.25 OR 0.5 MG/DOSE) 2 MG/3ML ~~LOC~~ SOPN: 0.2500 mg | PEN_INJECTOR | SUBCUTANEOUS | 0 refills | Status: DC

## 2023-07-13 MED ORDER — NOVOLOG FLEXPEN 100 UNIT/ML ~~LOC~~ SOPN: 20.0000 [IU] | PEN_INJECTOR | Freq: Three times a day (TID) | SUBCUTANEOUS | 2 refills | Status: DC

## 2023-07-13 NOTE — Progress Notes (Signed)
07/13/2023  Endocrinology follow-up note  Subjective:    Patient ID: Lance [name], male   DOB: 1976-06-01.  Lance [name] is being seen in follow-up for management of currently uncontrolled type 2 diabetes, complicated by coronary artery disease recently which required stent placement, hyperlipidemia, hypertension.   Past Medical History:  Diagnosis Date   CAD (coronary artery disease), native coronary artery    10/18 PCI/DES to mRCA, and OM, with total occlusion of dLCx with collaterals.    Essential hypertension    GERD (gastroesophageal reflux disease)    History of kidney stones    Hyperlipemia    MI (myocardial infarction) (HCC) 2020   SVT (supraventricular tachycardia) (HCC) 2015   Converted with adenosine   Type 2 diabetes mellitus (HCC)    Past Surgical History:  Procedure Laterality Date   CARDIAC SURGERY     CORONARY ANGIOPLASTY WITH STENT PLACEMENT  07/15/2017   "2 stents"   CORONARY BALLOON ANGIOPLASTY N/A 01/16/2023   Procedure: CORONARY BALLOON ANGIOPLASTY;  Surgeon: Lennette Bihari, MD;  Location: [facility];  Service: Cardiovascular;  Laterality: N/A;   CORONARY/GRAFT ACUTE MI REVASCULARIZATION N/A 03/12/2019   Procedure: Coronary/Graft Acute MI Revascularization;  Surgeon: Yvonne Kendall, MD;  Location: [facility];  Service: Cardiovascular;  Laterality: N/A;   LAPAROSCOPIC APPENDECTOMY N/A 11/12/2017   Procedure: APPENDECTOMY LAPAROSCOPIC, REPAIR OF INCARCERATED INCISIONAL HERNIA;  Surgeon: Karie Soda, MD;  Location: [facility];  Service: General;  Laterality: N/A;   LAPAROSCOPIC CHOLECYSTECTOMY     LEFT HEART CATH AND CORONARY ANGIOGRAPHY N/A 07/15/2017   Procedure: LEFT HEART CATH AND CORONARY ANGIOGRAPHY;  Surgeon: Tonny Bollman, MD;  Location: [facility];  Service: Cardiovascular;  Laterality: N/A;   LEFT HEART CATH AND CORONARY ANGIOGRAPHY N/A 03/12/2019   Procedure: LEFT HEART CATH AND CORONARY ANGIOGRAPHY;  Surgeon: Yvonne Kendall, MD;   Location: [facility];  Service: Cardiovascular;  Laterality: N/A;   LEFT HEART CATH AND CORONARY ANGIOGRAPHY N/A 01/16/2023   Procedure: LEFT HEART CATH AND CORONARY ANGIOGRAPHY;  Surgeon: Lennette Bihari, MD;  Location: [facility];  Service: Cardiovascular;  Laterality: N/A;   SVT ABLATION N/A 04/22/2021   Procedure: SVT ABLATION;  Surgeon: Marinus Maw, MD;  Location: [facility];  Service: Cardiovascular;  Laterality: N/A;   Social History   Socioeconomic History   Marital status: Married    Spouse name: Not on file   Number of children: Not on file   Years of education: Not on file   Highest education level: Not on file  Occupational History   Occupation: Superintendent    Employer: [employer]  Tobacco Use   Smoking status: Light Smoker    Current packs/day: 0.00    Average packs/day: 0.5 packs/day for 26.0 years (13.0 ttl pk-yrs)    Types: Cigarettes    Start date: 03/06/1993    Last attempt to quit: 03/07/2019    Years since quitting: 4.3    Passive exposure: Never   Smokeless tobacco: Never  Vaping Use   Vaping status: Never Used  Substance and Sexual Activity   Alcohol use: No   Drug use: No   Sexual activity: Yes    Birth control/protection: None  Other Topics Concern   Not on file  Social History Narrative   Not on file   Social Determinants of Health   Financial Resource Strain: Not on file  Food Insecurity: No Food Insecurity (02/07/2023)   Hunger Vital  Sign    Worried About Programme researcher, broadcasting/film/video in the Last Year: Never true    Ran Out of Food in the Last Year: Never true  Transportation Needs: No Transportation Needs (02/07/2023)   PRAPARE - Administrator, Civil Service (Medical): No    Lack of Transportation (Non-Medical): No  Physical Activity: Not on file  Stress: Not on file  Social Connections: Not on file   Outpatient Encounter Medications as of 07/13/2023  Medication Sig   Semaglutide,0.25 or 0.5MG /DOS,  (OZEMPIC, 0.25 OR 0.5 MG/DOSE,) 2 MG/3ML SOPN Inject 0.25 mg into the skin once a week.   aspirin EC 81 MG tablet Take 1 tablet (81 mg total) by mouth daily with breakfast.   atorvastatin (LIPITOR) 80 MG tablet TAKE 1 TABLET BY MOUTH EVERYDAY AT BEDTIME (Patient taking differently: Take 80 mg by mouth at bedtime.)   blood glucose meter kit and supplies KIT 1 each by Does not apply route 4 (four) times daily. Dispense based on patient and insurance preference. Use up to four times daily as directed. (FOR ICD-9 250.00, 250.01).   Continuous Glucose Sensor (DEXCOM G7 SENSOR) MISC CHANGE SENSOR EVERY 10 DAYS   dicyclomine (BENTYL) 20 MG tablet Take 1 tablet (20 mg total) by mouth 2 (two) times daily. (Patient not taking: Reported on 07/06/2023)   famotidine (PEPCID) 40 MG tablet Take 40 mg by mouth daily.   insulin aspart (NOVOLOG FLEXPEN) 100 UNIT/ML FlexPen Inject 20-26 Units into the skin 3 (three) times daily before meals.   insulin glargine (LANTUS SOLOSTAR) 100 UNIT/ML Solostar Pen INJECT 70 UNITS INTO THE SKIN AT BEDTIME.   Insulin Pen Needle (B-D ULTRAFINE III SHORT PEN) 31G X 8 MM MISC USE TO INJECT INSULIN 4 (FOUR) TIMES DAILY.   lisinopril (ZESTRIL) 40 MG tablet TAKE 1 TABLET BY MOUTH EVERY DAY   nitroGLYCERIN (NITROSTAT) 0.4 MG SL tablet Place 1 tablet (0.4 mg total) under the tongue every 5 (five) minutes x 3 doses as needed for chest pain.   omeprazole (PRILOSEC) 40 MG capsule Take 40 mg by mouth daily.   ticagrelor (BRILINTA) 90 MG TABS tablet Take 1 tablet (90 mg total) by mouth 2 (two) times daily.   [DISCONTINUED] insulin aspart (NOVOLOG FLEXPEN) 100 UNIT/ML FlexPen INJECT 16 UNITS INTO THE SKIN 3 (THREE) TIMES DAILY WITH MEALS. (Patient taking differently: Inject 22-28 Units into the skin 3 (three) times daily with meals.)   [DISCONTINUED] OZEMPIC, 1 MG/DOSE, 4 MG/3ML SOPN Inject 1 mg into the skin once a week. (Patient not taking: Reported on 06/27/2023)   No facility-administered  encounter medications on file as of 07/13/2023.   ALLERGIES: Allergies  Allergen Reactions   Bee Venom Anaphylaxis and Swelling   Penicillins Hives, Itching and Rash   VACCINATION STATUS:  There is no immunization history on file for this patient.  Diabetes He presents for his follow-up diabetic visit. He has type 2 diabetes mellitus. Onset time: He was diagnosed at approximate age of 35 years. His disease course has been worsening (Was recently hospitalized due to appendicitis after 2 months of antibiotic treatment.  He is status post appendectomy.  ). There are no hypoglycemic associated symptoms. Pertinent negatives for hypoglycemia include no confusion, headaches, pallor or seizures. Pertinent negatives for diabetes include no blurred vision, no chest pain, no fatigue, no polydipsia, no polyphagia, no polyuria and no weakness. There are no hypoglycemic complications. Symptoms are worsening. Diabetic complications include heart disease. Risk factors for coronary artery disease include  diabetes mellitus, dyslipidemia, family history, hypertension, male sex, obesity, tobacco exposure and sedentary lifestyle. Current diabetic treatment includes intensive insulin program (He is on Levemir 68 units BID and Humalog 10 units TIDAC.). His weight is increasing steadily. He is following a generally unhealthy diet. When asked about meal planning, he reported none. He has not had a previous visit with a dietitian. He rarely participates in exercise. His home blood glucose trend is increasing steadily. His breakfast blood glucose range is generally 140-180 mg/dl. His lunch blood glucose range is generally 140-180 mg/dl. His dinner blood glucose range is generally 140-180 mg/dl. His bedtime blood glucose range is generally 140-180 mg/dl. His overall blood glucose range is 140-180 mg/dl. Lance [name] presents with his CGM device showing worsening of glycemic profile recently.  His AGP report shows 73% time range, 26%  level 1 hyperglycemia, 1% level 2 hyperglycemia.  His point-of-care A1c is 7.9% increasing from 7.1%.   He did not document any hypoglycemia. ) An ACE inhibitor/angiotensin II receptor blocker is being taken. He does not see a podiatrist.Eye exam is not current.  Hyperlipidemia This is a chronic problem. The current episode started more than 1 year ago. The problem is controlled. Exacerbating diseases include diabetes and obesity. Pertinent negatives include no chest pain, myalgias or shortness of breath. Current antihyperlipidemic treatment includes statins and bile acid squestrants. The current treatment provides significant improvement of lipids. Risk factors for coronary artery disease include diabetes mellitus, dyslipidemia, hypertension, male sex, obesity and a sedentary lifestyle.  Hypertension This is a chronic problem. The current episode started more than 1 year ago. Pertinent negatives include no blurred vision, chest pain, headaches, neck pain, palpitations or shortness of breath. Risk factors for coronary artery disease include dyslipidemia, diabetes mellitus, male gender, smoking/tobacco exposure and sedentary lifestyle. Past treatments include ACE inhibitors. Hypertensive end-organ damage includes CAD/MI.   Review of systems  Constitutional: + Minimally fluctuating body weight,  current  Body mass index is 40.47 kg/m. , no fatigue, no subjective hyperthermia, no subjective hypothermia    Objective:    BP (!) 142/66 Comment: L arm with manuel cuff. Dr.Mikle Sternberg made aware. Pt states he does have a BP monitor at home and is checking BP daily. Advised pt to continue checking BP daily and contact PCP if BP continues to be elevated.  Pulse 80   Ht 5\' 11"  (1.803 m)   Wt 290 lb 3.2 oz (131.6 kg)   BMI 40.47 kg/m   Wt Readings from Last 3 Encounters:  07/13/23 290 lb 3.2 oz (131.6 kg)  07/06/23 286 lb (129.7 kg)  06/27/23 280 lb (127 kg)      Physical Exam- Limited  Constitutional:   Body mass index is 40.47 kg/m. , not in acute distress  CMP ( most recent) CMP     Component Value Date/Time   NA 133 (L) 06/27/2023 1001   NA 139 09/22/2022 0756   K 4.3 06/27/2023 1001   CL 100 06/27/2023 1001   CO2 23 06/27/2023 1001   GLUCOSE 137 (H) 06/27/2023 1001   BUN 12 06/27/2023 1001   BUN 16 09/22/2022 0756   CREATININE 1.03 06/27/2023 1001   CREATININE 0.91 04/30/2020 0728   CALCIUM 8.7 (L) 06/27/2023 1001   PROT 6.8 06/27/2023 1001   PROT 6.4 09/22/2022 0756   ALBUMIN 3.8 06/27/2023 1001   ALBUMIN 4.1 09/22/2022 0756   AST 29 06/27/2023 1001   ALT 34 06/27/2023 1001   ALKPHOS 83 06/27/2023 1001   BILITOT 1.2  06/27/2023 1001   BILITOT 0.6 09/22/2022 0756   GFRNONAA >60 06/27/2023 1001   GFRNONAA 102 04/30/2020 0728   GFRAA 118 04/30/2020 0728     Diabetic Labs (most recent): Lab Results  Component Value Date   HGBA1C 7.1 (H) 01/16/2023   HGBA1C 7.2 (A) 10/29/2022   HGBA1C 7.7 (A) 05/29/2022   MICROALBUR 1.0 04/30/2020   MICROALBUR 0.4 10/11/2019   MICROALBUR 0.8 04/19/2018    Lipid Panel     Component Value Date/Time   CHOL 111 09/22/2022 0756   TRIG 197 (H) 09/22/2022 0756   HDL 19 (L) 09/22/2022 0756   CHOLHDL 5.8 (H) 09/22/2022 0756   CHOLHDL 5.5 (H) 10/11/2019 0856   VLDL 47 (H) 03/13/2019 0547   LDLCALC 59 09/22/2022 0756   LDLCALC 63 10/11/2019 0856     Assessment & Plan:   1. Uncontrolled type 2 diabetes mellitus with chronic kidney disease, coronary artery disease, with long-term current use of insulin.   Lance [name] presents with his CGM device showing worsening of glycemic profile recently.  His AGP report shows 73% time range, 26% level 1 hyperglycemia, 1% level 2 hyperglycemia.  His point-of-care A1c is 7.9% increasing from 7.1%.   He did not document any hypoglycemia.   Recent labs reviewed. -his diabetes is complicated by obesity/sedentary life, coronary artery disease which recently required coronary catheterization, chronic  heavy smoking and Lance [name] remains at a high risk for more acute and chronic complications which include CAD, CVA, CKD, retinopathy, and neuropathy. These are all discussed in detail with the patient.  - I have counseled him on diet management and weight loss, by adopting a carbohydrate restricted/protein rich diet. -He is partially disengaged from lifestyle medicine.  He will still benefit from lifestyle medicine.   - he acknowledges that there is a room for improvement in his food and drink choices. - Suggestion is made for him to avoid simple carbohydrates  from his diet including Cakes, Sweet Desserts, Ice Cream, Soda (diet and regular), Sweet Tea, Candies, Chips, Cookies, Store Bought Juices, Alcohol , Artificial Sweeteners,  Coffee Creamer, and "Sugar-free" Products, Lemonade. This will help patient to have more stable blood glucose profile and potentially avoid unintended weight gain.  The following Lifestyle Medicine recommendations according to American College of Lifestyle Medicine  [facility]) were discussed and and offered to patient and he  agrees to start the journey:  A. Whole Foods, Plant-Based Nutrition comprising of fruits and vegetables, plant-based proteins, whole-grain carbohydrates was discussed in detail with the patient.   A list for source of those nutrients were also provided to the patient.  Patient will use only water or unsweetened tea for hydration. B.  The need to stay away from risky substances including alcohol, smoking; obtaining 7 to 9 hours of restorative sleep, at least 150 minutes of moderate intensity exercise weekly, the importance of healthy social connections,  and stress management techniques were discussed. C.  A full color page of  Calorie density of various food groups per pound showing examples of each food groups was provided to the patient.    - he is advised to stick to a routine mealtimes to eat 3 meals  a day and avoid unnecessary snacks ( to snack  only to correct hypoglycemia).   - I have approached him with the following individualized plan to manage diabetes and patient agrees:   -Based on his presentation with slight loss of control, he is advised to resume his basal/bolus insulin in  order for him to achieve control of diabetes to target. Accordingly, he is advised to continue Lantus 70 units nightly, continue NovoLog 20-26  units 3 times daily before meals, associated with strict monitoring of blood glucose 4 times a day-before meals and at bedtime . -He is advised to use his CGM at all times, especially monitoring before meals and at bedtime.   -He did not tolerate higher dose of Ozempic which caused abdominal pain.  He is cleared by his cardiologist to resume his Ozempic.  I discussed initiated 0.25 mg subcutaneously weekly, to advance as tolerated.  -Patient is encouraged to call clinic for blood glucose levels less than 70 or above 200 mg /dl.  -He does not want to continue metformin.  - Patient specific target  A1c;  LDL, HDL, Triglycerides,  were discussed in detail.  2) BP/HTN: -His blood pressure is not controlled to target.   He is advised to continue  with his current blood pressure medications including lisinopril 40 mg p.o. daily along with his carvedilol 3.125 mg p.o. twice daily.    3) Lipids: His recent lipid panel showed improved and controlled LDL at 59.  He is advised to continue Lipitor 80 mg p.o. daily at bedtime.    He is also on omega-3 fatty acids 1000 mg p.o. twice daily.  Whole food plant-based diet will help with dyslipidemia.      4)  Weight/Diet: His BMI is 40.47---clearly complicating his diabetes care.  He is a candidate for major weight loss.  CDE Consult has been initiated  , exercise, and detailed carbohydrates information provided.  He is an ideal candidate for bariatric surgery.    5) Chronic Care/Health Maintenance:  -he  is on ACEI/ARB and Statin medications and  is encouraged to continue to  follow up with Ophthalmology, Dentist,  Podiatrist at least yearly or according to recommendations, and advised to  quit smoking. I have recommended yearly flu vaccine and pneumonia vaccination at least every 5 years; moderate intensity exercise for up to 150 minutes weekly; and  sleep for at least 7 hours a day. The patient was counseled on the dangers of tobacco use, and was advised to quit.  Reviewed strategies to maximize success, including removing cigarettes and smoking materials from environment.  - I advised patient to maintain close follow up with Lianne Moris, PA-C for primary care needs.   I spent  26  minutes in the care of the patient today including review of labs from CMP, Lipids, Thyroid Function, Hematology (current and previous including abstractions from other facilities); face-to-face time discussing  his blood glucose readings/logs, discussing hypoglycemia and hyperglycemia episodes and symptoms, medications doses, his options of short and long term treatment based on the latest standards of care / guidelines;  discussion about incorporating lifestyle medicine;  and documenting the encounter. Risk reduction counseling performed per USPSTF guidelines to reduce  obesity and cardiovascular risk factors.     Please refer to Patient Instructions for Blood Glucose Monitoring and Insulin/Medications Dosing Guide"  in media tab for additional information. Please  also refer to " Patient Self Inventory" in the Media  tab for reviewed elements of pertinent patient history.  [name] participated in the discussions, expressed understanding, and voiced agreement with the above plans.  All questions were answered to his satisfaction. he is encouraged to contact clinic should he have any questions or concerns prior to his return visit.     Follow up plan: - Return in about  4 months (around 11/13/2023) for Bring Meter/CGM Device/Logs- A1c in Office.  Marquis Lunch, MD Phone: [phone]   Fax: [phone]  -  This note was partially dictated with voice recognition software. Similar sounding words can be transcribed inadequately or may not  be corrected upon review.  07/13/2023, 5:54 PM

## 2023-07-13 NOTE — Patient Instructions (Signed)

## 2023-07-18 LAB — FERRITIN: Ferritin: 8 ng/mL — ABNORMAL LOW (ref 30–400)

## 2023-07-20 ENCOUNTER — Telehealth: Payer: Self-pay

## 2023-07-20 ENCOUNTER — Other Ambulatory Visit (HOSPITAL_COMMUNITY): Payer: Self-pay

## 2023-07-20 ENCOUNTER — Telehealth: Payer: Self-pay | Admitting: "Endocrinology

## 2023-07-20 NOTE — Telephone Encounter (Signed)
Pharmacy Patient Advocate Encounter   Received notification from Pt Calls Messages that prior authorization for Dexcom G7 sensor is required/requested.   Insurance verification completed.   The patient is insured through [facility] ADVANTAGE/RX ADVANCE .   Per test claim: PA required; PA submitted to [facility] via CoverMyMeds Key/confirmation #/EOC [id number] Status is pending

## 2023-07-20 NOTE — Telephone Encounter (Signed)
Pt said [facility] is needing a Prior Auth for Dexcom G7 sensors.  Have yall seen this?

## 2023-07-20 NOTE — Progress Notes (Signed)
Tanya: can you please schedule diagnostic colonoscopy with the upper endoscopy which patient already has scheduled Diagnosis : severe iron deficiency Anemia  , abdominal pain , dyspepsia   No need to hold his aspirin and Brilinta as he is high risk  Also needs cardiology clearance given he has MI this year ( April 2024)  -----------------------------------  Toniann Fail : can you call the patient and tell him that his Iron level is very low ( ferritin 8) . This is why I am going to recommend both upper endoscopy and colonoscopy to be done at same time to make sure he doesn't have a lesion in his stomach and colon which would explain the iron deficiency . Again let him know this will be diagnostic procedure because if large polyps are seen they wont be removed now , and will be removed later with repeat procedure when he is off Brilinta given risk of bleeding   So in short he has severe iron deficiency anemia  which signifies slow bleeding and I recommend both upper endoscopy and colonoscopy

## 2023-07-21 ENCOUNTER — Encounter (INDEPENDENT_AMBULATORY_CARE_PROVIDER_SITE_OTHER): Payer: Self-pay

## 2023-07-21 ENCOUNTER — Other Ambulatory Visit: Payer: Self-pay | Admitting: "Endocrinology

## 2023-07-21 ENCOUNTER — Other Ambulatory Visit: Payer: Self-pay | Admitting: Cardiology

## 2023-07-21 ENCOUNTER — Telehealth (INDEPENDENT_AMBULATORY_CARE_PROVIDER_SITE_OTHER): Payer: Self-pay | Admitting: Gastroenterology

## 2023-07-21 ENCOUNTER — Ambulatory Visit (HOSPITAL_COMMUNITY)
Admission: RE | Admit: 2023-07-21 | Discharge: 2023-07-21 | Disposition: A | Discharge status: Home / Self Care | Payer: 59 | Source: Ambulatory Visit | Attending: Gastroenterology | Admitting: Gastroenterology

## 2023-07-21 DIAGNOSIS — R1013 Epigastric pain: Secondary | ICD-10-CM | POA: Insufficient documentation

## 2023-07-21 DIAGNOSIS — E1165 Type 2 diabetes mellitus with hyperglycemia: Secondary | ICD-10-CM

## 2023-07-21 MED ORDER — PEG 3350-KCL-NA BICARB-NACL 420 G PO SOLR: 4000.0000 mL | Freq: Once | ORAL | 0 refills | Status: AC

## 2023-07-21 NOTE — Telephone Encounter (Signed)
Primary Cardiologist:Samuel Diona Browner, MD  Chart reviewed as part of pre-operative protocol coverage. Because of LARREN COPES past medical history and time since last visit, he/she will require a follow-up visit in order to better assess preoperative cardiovascular risk.  Pre-op covering staff: -Patient has an appointment with Dr. Diona Browner on 07/22/2023 at which time clearance can be addressed.  Appointment notes have been updated.   Levi Aland, NP-C  07/21/2023, 3:36 PM [address] Office [phone] Fax [phone]

## 2023-07-21 NOTE — Telephone Encounter (Signed)
    07/21/23  Lance [name] 1976-08-13  What type of surgery is being performed? EGD/Colonoscopy   When is surgery scheduled? 08/06/23  What type of clearance is required (medical or pharmacy to hold medication or both? Cardiac Clearance  Are there any medications that need to be held prior to surgery and how long?   Name of physician performing surgery?  Dr.Ahmed Aaron [facility] Phone: [phone] Fax: [phone]  Anethesia type (none, local, MAC, general)? MAC

## 2023-07-21 NOTE — Telephone Encounter (Signed)
Lance Macho, MD  Metro Kung, LPN  Cc: Marlowe Shores, LPN  Kenney Houseman: can you please schedule diagnostic colonoscopy with the upper endoscopy which patient already has scheduled Diagnosis : severe iron deficiency Anemia, abdominal pain , dyspepsia   TCS has been added to EGD on 08/06/23. Instructions mailed and prep sent to pharmacy. No PA needed

## 2023-07-21 NOTE — Telephone Encounter (Signed)
Pharmacy Patient Advocate Encounter  Received notification from [facility] that Prior Authorization for Dexcom G7 sensor has been APPROVED through 07/19/2024   PA #/Case ID/Reference #: [id number]

## 2023-07-22 ENCOUNTER — Ambulatory Visit: Payer: 59 | Attending: Cardiology | Admitting: Cardiology

## 2023-07-22 ENCOUNTER — Encounter: Payer: Self-pay | Admitting: Cardiology

## 2023-07-22 VITALS — BP 138/62 | HR 77 | Ht 71.0 in | Wt 289.8 lb

## 2023-07-22 DIAGNOSIS — I10 Essential (primary) hypertension: Secondary | ICD-10-CM | POA: Diagnosis not present

## 2023-07-22 DIAGNOSIS — I25119 Atherosclerotic heart disease of native coronary artery with unspecified angina pectoris: Secondary | ICD-10-CM | POA: Diagnosis not present

## 2023-07-22 DIAGNOSIS — I4719 Other supraventricular tachycardia: Secondary | ICD-10-CM | POA: Diagnosis not present

## 2023-07-22 DIAGNOSIS — E782 Mixed hyperlipidemia: Secondary | ICD-10-CM

## 2023-07-22 NOTE — Progress Notes (Signed)
Cardiology Office Note  Date: 07/22/2023   ID: Lance [name], DOB July 09, 1976, MRN [id number]  History of Present Illness: Lance [name] is a 47 y.o. male last seen in April by Ms. Philis Nettle NP, I reviewed the note.  He is here today for a follow-up visit. Chart review finds plan for EGD and colonoscopy by Dr. Tasia Catchings later this month.  He has had epigastric discomfort and iron deficiency anemia, no obvious melena or hematochezia.  No indicates plan to proceed as a purely diagnostic study without stopping either aspirin or Brilinta.  He underwent DES intervention to the LAD in April of this year in the setting of unstable angina.  I reviewed his medications.  Current cardiac regimen includes aspirin, Brilinta, Lipitor, lisinopril, and as needed nitroglycerin.  He does not report any angina at this time, stable NYHA class II dyspnea.  No palpitations or syncope.  I reviewed his most recent lab work as noted below.  Physical Exam: VS:  BP 138/62 (BP Location: Right Arm)   Pulse 77   Ht 5\' 11"  (1.803 m)   Wt 289 lb 12.8 oz (131.5 kg)   SpO2 97%   BMI 40.42 kg/m , BMI Body mass index is 40.42 kg/m.  Wt Readings from Last 3 Encounters:  07/22/23 289 lb 12.8 oz (131.5 kg)  07/13/23 290 lb 3.2 oz (131.6 kg)  07/06/23 286 lb (129.7 kg)    General: Patient appears comfortable at rest. HEENT: Conjunctiva and lids normal. Neck: Supple, no elevated JVP or carotid bruits. Lungs: Clear to auscultation, nonlabored breathing at rest. Cardiac: Regular rate and rhythm, no S3 or significant systolic murmur, no pericardial rub. Extremities: No pitting edema.  ECG:  An ECG dated 06/27/2023 was personally reviewed today and demonstrated:  Sinus rhythm.  Labwork: 10/05/2022: B Natriuretic Peptide 25.0; TSH 2.450 02/07/2023: Magnesium 2.1 06/27/2023: ALT 34; AST 29; BUN 12; Creatinine, Ser 1.03; Hemoglobin 12.5; Platelets 216; Potassium 4.3; Sodium 133     Component Value Date/Time   CHOL 111 09/22/2022  0756   TRIG 197 (H) 09/22/2022 0756   HDL 19 (L) 09/22/2022 0756   CHOLHDL 5.8 (H) 09/22/2022 0756   CHOLHDL 5.5 (H) 10/11/2019 0856   VLDL 47 (H) 03/13/2019 0547   LDLCALC 59 09/22/2022 0756   LDLCALC 63 10/11/2019 0856  October 2024: Hemoglobin 11.7, platelets 254, BUN 12, creatinine 1.0, potassium 5.0, AST 25, ALT 34, cholesterol 130, triglycerides 191, HDL 24, LDL 73, TSH 1.39  Other Studies Reviewed Today:  Cardiac catheterization and PCI 01/16/2023:   Mid RCA lesion is 25% stenosed.   Prox LAD lesion is 80% stenosed.   Ost 1st Mrg to 1st Mrg lesion is 20% stenosed.   Dist RCA lesion is 40% stenosed.   Mid Cx lesion is 20% stenosed.   Previously placed Prox Cx stent of unknown type is  widely patent.   Scoring balloon angioplasty was performed using a BALL SAPPHIRE NC24 [id number].   Post intervention, there is a 5% residual stenosis.   The left ventricular systolic function is normal.   LV end diastolic pressure is low.   Multivessel CAD with the previously placed proximal LAD stent with diffuse 80% in-stent restenosis.   Left circumflex coronary artery has previously placed proximal and OM1 stents with mild 15% narrowing within the OM stent.  There is mid AV groove 20% stenosis.   The RCA has previously placed mid stent with 20% smooth in-stent narrowing.  There is 40% distal smooth RCA  stenosis.   Relatively preserved global LV contractility with LVEDP low at 9 mmHg.  EF estimate approximately 55%.   Successful percutaneous coronary intervention with [place] cutting balloon atherotomy with noncompliant dilatation with a 3.75 x 15 mm noncompliant balloon.  The 80% stenosis was reduced to less than 5%.  Echocardiogram 05/10/2023:  1. Left ventricular ejection fraction, by estimation, is 60 to 65%. The  left ventricle has normal function. The left ventricle demonstrates  regional wall motion abnormalities (see scoring diagram/findings for  description). The distal apex as  detailed in   prior TTE, is akinetic on definity imaging (clip 39, 40). There is mild  concentric left ventricular hypertrophy. Left ventricular diastolic  parameters are consistent with Grade I diastolic dysfunction (impaired  relaxation).   2. Right ventricular systolic function is normal. The right ventricular  size is normal.   3. The mitral valve is grossly normal. No evidence of mitral valve  regurgitation.   4. The aortic valve is tricuspid. Aortic valve sclerosis/calcification is  present, without any evidence of aortic stenosis.   5. The inferior vena cava is normal in size with greater than 50%  respiratory variability, suggesting right atrial pressure of 3 mmHg.   Assessment and Plan:  1.  CAD status post DES to the mid RCA and OM in October 2018 with documented occlusion of the distal circumflex associated with collaterals.  He underwent subsequent DES to the proximal LAD and angioplasty with stent intervention to the first obtuse marginal and mid circumflex in June 2020.  Most recently underwent cardiac catheterization in April 2024 in the setting of unstable angina and underwent atherectomy to the proximal LAD for treatment of in-stent restenosis.  He is symptomatically stable at this time.  Continues on aspirin, Brilinta, Lipitor, lisinopril, and as needed nitroglycerin.  Uninterrupted DAPT recommended for 1 year after intervention and most likely chronic treatment as tolerated.  2.  Epigastric pain with iron-deficiency anemia and plan for EGD and colonoscopy by Dr. Tasia Catchings.  Unable to stop aspirin and Brilinta until April 2025.  Chart indicates plan to proceed with endoscopic evaluation as a diagnostic only study on DAPT.  He is clinically stable from a cardiac perspective otherwise and should be able to proceed without further cardiac testing.  2.  AVNRT status post radiofrequency ablation by Dr. Ladona Ridgel in July 2022.  No palpitations.  3.  Mixed hyperlipidemia, recent LDL 73.   Continues on Lipitor.  4.  Primary hypertension.  Continue lisinopril.  Disposition:  Follow up  6 months.  Signed, Jonelle Sidle, M.D., F.A.C.C. [facility]

## 2023-07-22 NOTE — Patient Instructions (Addendum)

## 2023-07-30 ENCOUNTER — Telehealth (INDEPENDENT_AMBULATORY_CARE_PROVIDER_SITE_OTHER): Payer: Self-pay | Admitting: Gastroenterology

## 2023-07-30 MED ORDER — SUTAB 1479-225-188 MG PO TABS: ORAL_TABLET | ORAL | 0 refills | Status: DC

## 2023-07-30 NOTE — Telephone Encounter (Signed)
Pt called in and states he has not heard about pre op or anything regarding doing TCS with EGD. TCS has been added. Pt would like tablets instead of liquid. Pt has already picked up for liquid prep. Will send in pills. Will print out instructions for both prep and pt will come by to pick them up. Pt also advised about pre op

## 2023-07-30 NOTE — Progress Notes (Signed)
Hi Wendy ,  Can you please call the patient and tell the patient the ultrasound shows fatty liver   Also again can  tell him that his Iron level is very low ( ferritin 8) . This is why I am going to recommend both upper endoscopy and colonoscopy to be done at same time to make sure he doesn't have a lesion in his stomach and colon which would explain the iron deficiency . Again let him know this will be diagnostic procedure because if large polyps are seen they wont be removed now , and will be removed later with repeat procedure when he is off Brilinta given risk of bleeding   So in short he has severe iron deficiency anemia  which signifies slow bleeding and I recommend both upper endoscopy and colonoscopy for which he is already scheduled    Thanks,  [name], MD Gastroenterology and Hepatology [facility]

## 2023-07-30 NOTE — Telephone Encounter (Signed)
FYI-pt saw cardiology on 07/22/23 2.  Epigastric pain with iron-deficiency anemia and plan for EGD and colonoscopy by Dr. Tasia Catchings.  Unable to stop aspirin and Brilinta until April 2025.  Chart indicates plan to proceed with endoscopic evaluation as a diagnostic only study on DAPT.  He is clinically stable from a cardiac perspective otherwise and should be able to proceed without further cardiac testing.

## 2023-07-31 ENCOUNTER — Encounter (INDEPENDENT_AMBULATORY_CARE_PROVIDER_SITE_OTHER): Payer: Self-pay | Admitting: *Deleted

## 2023-08-04 ENCOUNTER — Encounter (HOSPITAL_COMMUNITY)
Admission: RE | Admit: 2023-08-04 | Discharge: 2023-08-04 | Disposition: A | Discharge status: Home / Self Care | Payer: 59 | Source: Ambulatory Visit | Attending: Gastroenterology | Admitting: Gastroenterology

## 2023-08-04 ENCOUNTER — Encounter (HOSPITAL_COMMUNITY): Payer: Self-pay

## 2023-08-06 ENCOUNTER — Encounter (HOSPITAL_COMMUNITY)
Admission: RE | Disposition: A | Discharge status: Home / Self Care | Payer: Self-pay | Source: Home / Self Care | Attending: Gastroenterology

## 2023-08-06 ENCOUNTER — Other Ambulatory Visit: Payer: Self-pay

## 2023-08-06 ENCOUNTER — Encounter (HOSPITAL_COMMUNITY): Payer: Self-pay

## 2023-08-06 ENCOUNTER — Ambulatory Visit (HOSPITAL_BASED_OUTPATIENT_CLINIC_OR_DEPARTMENT_OTHER): Payer: 59 | Admitting: Certified Registered"

## 2023-08-06 ENCOUNTER — Ambulatory Visit (HOSPITAL_COMMUNITY): Payer: 59 | Admitting: Certified Registered"

## 2023-08-06 ENCOUNTER — Ambulatory Visit (HOSPITAL_COMMUNITY)
Admission: RE | Admit: 2023-08-06 | Discharge: 2023-08-06 | Disposition: A | Discharge status: Home / Self Care | Payer: 59 | Attending: Gastroenterology | Admitting: Gastroenterology

## 2023-08-06 DIAGNOSIS — F17210 Nicotine dependence, cigarettes, uncomplicated: Secondary | ICD-10-CM | POA: Diagnosis not present

## 2023-08-06 DIAGNOSIS — I2510 Atherosclerotic heart disease of native coronary artery without angina pectoris: Secondary | ICD-10-CM | POA: Diagnosis not present

## 2023-08-06 DIAGNOSIS — K648 Other hemorrhoids: Secondary | ICD-10-CM | POA: Insufficient documentation

## 2023-08-06 DIAGNOSIS — K219 Gastro-esophageal reflux disease without esophagitis: Secondary | ICD-10-CM | POA: Insufficient documentation

## 2023-08-06 DIAGNOSIS — Z833 Family history of diabetes mellitus: Secondary | ICD-10-CM | POA: Diagnosis not present

## 2023-08-06 DIAGNOSIS — K317 Polyp of stomach and duodenum: Secondary | ICD-10-CM

## 2023-08-06 DIAGNOSIS — K3189 Other diseases of stomach and duodenum: Secondary | ICD-10-CM | POA: Diagnosis not present

## 2023-08-06 DIAGNOSIS — I10 Essential (primary) hypertension: Secondary | ICD-10-CM | POA: Insufficient documentation

## 2023-08-06 DIAGNOSIS — D122 Benign neoplasm of ascending colon: Secondary | ICD-10-CM | POA: Diagnosis not present

## 2023-08-06 DIAGNOSIS — I25119 Atherosclerotic heart disease of native coronary artery with unspecified angina pectoris: Secondary | ICD-10-CM | POA: Diagnosis not present

## 2023-08-06 DIAGNOSIS — K2970 Gastritis, unspecified, without bleeding: Secondary | ICD-10-CM

## 2023-08-06 DIAGNOSIS — Z6839 Body mass index (BMI) 39.0-39.9, adult: Secondary | ICD-10-CM | POA: Insufficient documentation

## 2023-08-06 DIAGNOSIS — I252 Old myocardial infarction: Secondary | ICD-10-CM | POA: Insufficient documentation

## 2023-08-06 DIAGNOSIS — D509 Iron deficiency anemia, unspecified: Secondary | ICD-10-CM | POA: Insufficient documentation

## 2023-08-06 DIAGNOSIS — R1013 Epigastric pain: Secondary | ICD-10-CM | POA: Diagnosis not present

## 2023-08-06 DIAGNOSIS — Z794 Long term (current) use of insulin: Secondary | ICD-10-CM | POA: Insufficient documentation

## 2023-08-06 DIAGNOSIS — K644 Residual hemorrhoidal skin tags: Secondary | ICD-10-CM | POA: Insufficient documentation

## 2023-08-06 DIAGNOSIS — D123 Benign neoplasm of transverse colon: Secondary | ICD-10-CM | POA: Insufficient documentation

## 2023-08-06 DIAGNOSIS — Z955 Presence of coronary angioplasty implant and graft: Secondary | ICD-10-CM | POA: Diagnosis not present

## 2023-08-06 DIAGNOSIS — D125 Benign neoplasm of sigmoid colon: Secondary | ICD-10-CM

## 2023-08-06 DIAGNOSIS — E119 Type 2 diabetes mellitus without complications: Secondary | ICD-10-CM | POA: Diagnosis not present

## 2023-08-06 DIAGNOSIS — Z8679 Personal history of other diseases of the circulatory system: Secondary | ICD-10-CM | POA: Diagnosis not present

## 2023-08-06 DIAGNOSIS — E785 Hyperlipidemia, unspecified: Secondary | ICD-10-CM | POA: Insufficient documentation

## 2023-08-06 DIAGNOSIS — D126 Benign neoplasm of colon, unspecified: Secondary | ICD-10-CM

## 2023-08-06 HISTORY — PX: COLONOSCOPY WITH PROPOFOL: SHX5780

## 2023-08-06 HISTORY — PX: ESOPHAGOGASTRODUODENOSCOPY (EGD) WITH PROPOFOL: SHX5813

## 2023-08-06 HISTORY — PX: POLYPECTOMY: SHX149

## 2023-08-06 HISTORY — PX: BIOPSY: SHX5522

## 2023-08-06 HISTORY — PX: HEMOSTASIS CLIP PLACEMENT: SHX6857

## 2023-08-06 LAB — GLUCOSE, CAPILLARY: Glucose-Capillary: 108 mg/dL — ABNORMAL HIGH (ref 70–99)

## 2023-08-06 LAB — HM COLONOSCOPY

## 2023-08-06 SURGERY — ESOPHAGOGASTRODUODENOSCOPY (EGD) WITH PROPOFOL
Anesthesia: General

## 2023-08-06 MED ORDER — FERROUS SULFATE 325 (65 FE) MG PO TABS
325.0000 mg | ORAL_TABLET | ORAL | Status: DC
Start: 2023-08-06 — End: 2024-01-24

## 2023-08-06 MED ORDER — LACTATED RINGERS IV SOLN: INTRAVENOUS | Status: DC | PRN

## 2023-08-06 MED ORDER — PHENYLEPHRINE 80 MCG/ML (10ML) SYRINGE FOR IV PUSH (FOR BLOOD PRESSURE SUPPORT)
PREFILLED_SYRINGE | INTRAVENOUS | Status: AC
Start: 2023-08-06 — End: ?
  Filled 2023-08-06: qty 10

## 2023-08-06 MED ORDER — LIDOCAINE HCL (CARDIAC) PF 100 MG/5ML IV SOSY
PREFILLED_SYRINGE | INTRAVENOUS | Status: DC | PRN
  Administered 2023-08-06: 50 mg via INTRAVENOUS

## 2023-08-06 MED ORDER — POLYETHYLENE GLYCOL 3350 17 GM/SCOOP PO POWD: 8.5000 g | Freq: Two times a day (BID) | ORAL | Status: AC

## 2023-08-06 MED ORDER — PROPOFOL 500 MG/50ML IV EMUL
INTRAVENOUS | Status: DC | PRN
  Administered 2023-08-06: 250 ug/kg/min via INTRAVENOUS

## 2023-08-06 MED ORDER — PROPOFOL 10 MG/ML IV BOLUS
INTRAVENOUS | Status: DC | PRN
  Administered 2023-08-06 (×2): 50 mg via INTRAVENOUS
  Administered 2023-08-06: 100 mg via INTRAVENOUS

## 2023-08-06 MED ORDER — PHENYLEPHRINE 80 MCG/ML (10ML) SYRINGE FOR IV PUSH (FOR BLOOD PRESSURE SUPPORT)
PREFILLED_SYRINGE | INTRAVENOUS | Status: DC | PRN
Start: 2023-08-06 — End: 2023-08-06
  Administered 2023-08-06 (×2): 160 ug via INTRAVENOUS

## 2023-08-06 NOTE — Op Note (Signed)
[facility] Patient Name: Lance [name] Procedure Date: 08/06/2023 12:06 PM MRN: [id number] Date of Birth: Jun 11, 1976 Attending MD: Sanjuan Dame , MD, [id number] CSN: [id number] Age: 47 Admit Type: Outpatient Procedure:                Upper GI endoscopy Indications:              Epigastric abdominal pain, Unexplained iron                            deficiency anemia Providers:                Sanjuan Dame, MD, Crystal Page, Zena Amos Referring MD:              Medicines:                Monitored Anesthesia Care Complications:            No immediate complications. Estimated Blood Loss:     Estimated blood loss was minimal. Procedure:                Pre-Anesthesia Assessment:                           - Prior to the procedure, a History and Physical                            was performed, and patient medications and                            allergies were reviewed. The patient's tolerance of                            previous anesthesia was also reviewed. The risks                            and benefits of the procedure and the sedation                            options and risks were discussed with the patient.                            All questions were answered, and informed consent                            was obtained. Prior Anticoagulants: The patient has                            taken Brilinta (ticagrelor), last dose was day of                            procedure. ASA Grade Assessment: III - A patient                            with severe systemic disease. After reviewing the  risks and benefits, the patient was deemed in                            satisfactory condition to undergo the procedure.                           After obtaining informed consent, the endoscope was                            passed under direct vision. Throughout the                            procedure, the patient's blood pressure, pulse, and                             oxygen saturations were monitored continuously. The                            [id number]-[id number] ([id number]) scope was introduced through the                            mouth, and advanced to the second part of duodenum.                            The upper GI endoscopy was accomplished without                            difficulty. The patient tolerated the procedure                            well. Scope In: 1:04:14 PM Scope Out: 1:08:13 PM Total Procedure Duration: 0 hours 3 minutes 59 seconds  Findings:      The examined esophagus was normal.      Diffuse mildly erythematous mucosa without bleeding was found in the       entire examined stomach. Biopsies were taken with a cold forceps for       histology.      Multiple 3 to 12 mm sessile polyps with no bleeding and no stigmata of       recent bleeding were found in the gastric antrum. Biopsies were taken       with a cold forceps for histology.      The duodenal bulb and second portion of the duodenum were normal. Impression:               - Normal esophagus.                           - Erythematous mucosa in the stomach. Biopsied.                           - Multiple gastric polyps. Biopsied representative                            polyp .                           -  Normal duodenal bulb and second portion of the                            duodenum. Moderate Sedation:      Per Anesthesia Care Recommendation:           - Patient has a contact number available for                            emergencies. The signs and symptoms of potential                            delayed complications were discussed with the                            patient. Return to normal activities tomorrow.                            Written discharge instructions were provided to the                            patient.                           - Resume previous diet.                           - Continue present medications.                            - Await pathology results.                           - Repeat upper endoscopy for surveillance of                            multiple polyps if biopsy returns as Hyerplastic                            polyp, for removal of other polyps once on                            monoplatelet                           - Return to GI clinic as previously scheduled. Procedure Code(s):        --- Professional ---                           [phone], Esophagogastroduodenoscopy, flexible,                            transoral; with biopsy, single or multiple Diagnosis Code(s):        --- Professional ---                           K31.89, Other diseases of stomach and  duodenum                           K31.7, Polyp of stomach and duodenum                           R10.13, Epigastric pain                           D50.9, Iron deficiency anemia, unspecified CPT copyright 2022 American Medical Association. All rights reserved. The codes documented in this report are preliminary and upon coder review may  be revised to meet current compliance requirements. Sanjuan Dame, MD Sanjuan Dame, MD 08/06/2023 1:56:48 PM This report has been signed electronically. Number of Addenda: 0

## 2023-08-06 NOTE — Discharge Instructions (Signed)

## 2023-08-06 NOTE — Anesthesia Preprocedure Evaluation (Addendum)
Anesthesia Evaluation  Patient identified by MRN, date of birth, ID band Patient awake    Reviewed: Allergy & Precautions, H&P , NPO status , Patient's Chart, lab work & pertinent test results, reviewed documented beta blocker date and time   Airway Mallampati: II  TM Distance: >3 FB Neck ROM: full    Dental no notable dental hx.    Pulmonary neg pulmonary ROS, Current Smoker   Pulmonary exam normal breath sounds clear to auscultation       Cardiovascular Exercise Tolerance: Good hypertension, + angina  + CAD and + Past MI  negative cardio ROS  Rhythm:regular Rate:Normal     Neuro/Psych negative neurological ROS  negative psych ROS   GI/Hepatic negative GI ROS, Neg liver ROS,GERD  ,,  Endo/Other  negative endocrine ROSdiabetes    Renal/GU negative Renal ROS  negative genitourinary   Musculoskeletal   Abdominal   Peds  Hematology negative hematology ROS (+)   Anesthesia Other Findings   Reproductive/Obstetrics negative OB ROS                             Anesthesia Physical Anesthesia Plan  ASA: 3  Anesthesia Plan: General   Post-op Pain Management:    Induction:   PONV Risk Score and Plan: Propofol infusion  Airway Management Planned:   Additional Equipment:   Intra-op Plan:   Post-operative Plan:   Informed Consent: I have reviewed the patients History and Physical, chart, labs and discussed the procedure including the risks, benefits and alternatives for the proposed anesthesia with the patient or authorized representative who has indicated his/her understanding and acceptance.     Dental Advisory Given  Plan Discussed with: CRNA  Anesthesia Plan Comments:        Anesthesia Quick Evaluation

## 2023-08-06 NOTE — H&P (Signed)
Primary Care Physician:  Lianne Moris, PA-C Primary Gastroenterologist:  Dr. Tasia Catchings  Pre-Procedure History & Physical: HPI:  Lance [name] is a 47 y.o. male with CAD with history of STEMI status post PCI, diabetes on insulin, hypertension, hyperlipidemia, GERD, morbid obesity and history of SVT.  who presents for evaluation of Abdominal pain and Iron deficiency anemia   The patient reports epigastric pain for which she has been to the ER multiple times in the past month.  He reports sudden onset epigastric pain nonradiating no aggravating or relieving factor.  Patient reports he feels sore .he mentions that this issue has been recurring intermittently since he started Ozempic, with his last injection being two months ago. He has visited the ER multiple times for the same complaint, with reassuring evaluations each time. He sought evaluation due to his history of cardiac issues.  Patient reports the pain seems to improve slightly with omeprazole and Pepcid.  Denies taking any NSAIDs just takes Tylenol for any pain. The patient denies having any nausea, vomiting, fever, chills, hematochezia, melena, hematemesis, a jaundice, pruritus or weight loss.   Last NTI:RWER Last Colonoscopy:None   FHx: neg for any gastrointestinal/liver disease, no malignancies Social: Current Smoker  surgical: Appendectomy and cholecystectomy  Past Medical History:  Diagnosis Date   CAD (coronary artery disease), native coronary artery    10/18 PCI/DES to mRCA, and OM, with total occlusion of dLCx with collaterals.    Essential hypertension    GERD (gastroesophageal reflux disease)    History of kidney stones    Hyperlipemia    MI (myocardial infarction) (HCC) 2020   SVT (supraventricular tachycardia) (HCC) 2015   Converted with adenosine   Type 2 diabetes mellitus (HCC)     Past Surgical History:  Procedure Laterality Date   CARDIAC SURGERY     CORONARY ANGIOPLASTY WITH STENT PLACEMENT  07/15/2017   "2 stents"    CORONARY BALLOON ANGIOPLASTY N/A 01/16/2023   Procedure: CORONARY BALLOON ANGIOPLASTY;  Surgeon: Lennette Bihari, MD;  Location: [facility];  Service: Cardiovascular;  Laterality: N/A;   CORONARY/GRAFT ACUTE MI REVASCULARIZATION N/A 03/12/2019   Procedure: Coronary/Graft Acute MI Revascularization;  Surgeon: Yvonne Kendall, MD;  Location: [facility];  Service: Cardiovascular;  Laterality: N/A;   LAPAROSCOPIC APPENDECTOMY N/A 11/12/2017   Procedure: APPENDECTOMY LAPAROSCOPIC, REPAIR OF INCARCERATED INCISIONAL HERNIA;  Surgeon: Karie Soda, MD;  Location: [facility];  Service: General;  Laterality: N/A;   LAPAROSCOPIC CHOLECYSTECTOMY     LEFT HEART CATH AND CORONARY ANGIOGRAPHY N/A 07/15/2017   Procedure: LEFT HEART CATH AND CORONARY ANGIOGRAPHY;  Surgeon: Tonny Bollman, MD;  Location: [facility];  Service: Cardiovascular;  Laterality: N/A;   LEFT HEART CATH AND CORONARY ANGIOGRAPHY N/A 03/12/2019   Procedure: LEFT HEART CATH AND CORONARY ANGIOGRAPHY;  Surgeon: Yvonne Kendall, MD;  Location: [facility];  Service: Cardiovascular;  Laterality: N/A;   LEFT HEART CATH AND CORONARY ANGIOGRAPHY N/A 01/16/2023   Procedure: LEFT HEART CATH AND CORONARY ANGIOGRAPHY;  Surgeon: Lennette Bihari, MD;  Location: [facility];  Service: Cardiovascular;  Laterality: N/A;   SVT ABLATION N/A 04/22/2021   Procedure: SVT ABLATION;  Surgeon: Marinus Maw, MD;  Location: [facility];  Service: Cardiovascular;  Laterality: N/A;    Prior to Admission medications   Medication Sig Start Date End Date Taking? Authorizing Provider  aspirin EC 81 MG tablet Take 1 tablet (81 mg total) by mouth daily with breakfast. 04/05/19  Yes Emokpae, Courage, MD  atorvastatin (LIPITOR) 80 MG tablet TAKE 1 TABLET BY MOUTH EVERYDAY AT BEDTIME 07/21/23  Yes Jonelle Sidle, MD  blood glucose meter kit and supplies KIT 1 each by Does not apply route 4 (four) times daily. Dispense based on patient  and insurance preference. Use up to four times daily as directed. (FOR ICD-9 250.00, 250.01). 02/14/20  Yes Nida, Denman George, MD  Continuous Glucose Sensor (DEXCOM G7 SENSOR) MISC CHANGE SENSOR EVERY 10 DAYS 03/10/23  Yes Roma Kayser, MD  famotidine (PEPCID) 40 MG tablet Take 40 mg by mouth daily. 04/30/23  Yes [provider]  Insulin Pen Needle (B-D ULTRAFINE III SHORT PEN) 31G X 8 MM MISC USE TO INJECT INSULIN 4 (FOUR) TIMES DAILY. 12/10/22  Yes Roma Kayser, MD  lisinopril (ZESTRIL) 40 MG tablet TAKE 1 TABLET BY MOUTH EVERY DAY 05/29/23  Yes Jonelle Sidle, MD  omeprazole (PRILOSEC) 40 MG capsule Take 40 mg by mouth daily. 05/18/23  Yes [provider]  Sodium Sulfate-Mag Sulfate-KCl (SUTAB) [phone] MG TABS As directed 07/30/23  Yes Kaprice Kage, Juanetta Beets, MD  ticagrelor (BRILINTA) 90 MG TABS tablet Take 1 tablet (90 mg total) by mouth 2 (two) times daily. 01/26/23  Yes Sharlene Dory, NP  insulin aspart (NOVOLOG FLEXPEN) 100 UNIT/ML FlexPen Inject 20-26 Units into the skin 3 (three) times daily with meals. 07/21/23   Roma Kayser, MD  insulin glargine (LANTUS SOLOSTAR) 100 UNIT/ML Solostar Pen INJECT 70 UNITS INTO THE SKIN AT BEDTIME. 07/21/23   Nida, Denman George, MD  nitroGLYCERIN (NITROSTAT) 0.4 MG SL tablet Place 1 tablet (0.4 mg total) under the tongue every 5 (five) minutes x 3 doses as needed for chest pain. 09/02/21   Jonelle Sidle, MD    Allergies as of 07/06/2023 - Review Complete 07/06/2023  Allergen Reaction Noted   Bee venom Anaphylaxis and Swelling 01/26/2012   Penicillins Hives, Itching, and Rash 11/30/2011    Family History  Problem Relation Age of Onset   Heart disease Mother 6       CABG   Diabetes Mother    Gout Mother    Heart attack Father 66    Social History   Socioeconomic History   Marital status: Married    Spouse name: Not on file   Number of children: Not on file   Years of education: Not on  file   Highest education level: Not on file  Occupational History   Occupation: Superintendent    Employer: [employer]  Tobacco Use   Smoking status: Light Smoker    Current packs/day: 0.00    Average packs/day: 0.5 packs/day for 26.0 years (13.0 ttl pk-yrs)    Types: Cigarettes    Start date: 03/06/1993    Last attempt to quit: 03/07/2019    Years since quitting: 4.4    Passive exposure: Never   Smokeless tobacco: Never  Vaping Use   Vaping status: Never Used  Substance and Sexual Activity   Alcohol use: No   Drug use: No   Sexual activity: Yes    Birth control/protection: None  Other Topics Concern   Not on file  Social History Narrative   Not on file   Social Determinants of Health   Financial Resource Strain: Not on file  Food Insecurity: No Food Insecurity (02/07/2023)   Hunger Vital Sign    Worried About Running Out of Food in the Last Year: Never true    Ran Out of  Food in the Last Year: Never true  Transportation Needs: No Transportation Needs (02/07/2023)   PRAPARE - Administrator, Civil Service (Medical): No    Lack of Transportation (Non-Medical): No  Physical Activity: Not on file  Stress: Not on file  Social Connections: Not on file  Intimate Partner Violence: Not At Risk (02/07/2023)   Humiliation, Afraid, Rape, and Kick questionnaire    Fear of Current or Ex-Partner: No    Emotionally Abused: No    Physically Abused: No    Sexually Abused: No    Review of Systems: See HPI, otherwise negative ROS  Physical Exam: Vital signs in last 24 hours: Temp:  [98.1 F (36.7 C)] 98.1 F (36.7 C) (10/31 1113) Pulse Rate:  [70] 70 (10/31 1113) Resp:  [12] 12 (10/31 1113) BP: (142)/(70) 142/70 (10/31 1113) SpO2:  [97 %] 97 % (10/31 1113) Weight:  [129.3 kg] 129.3 kg (10/31 1113)   General:   Alert,  Well-developed, well-nourished, pleasant and cooperative in NAD Head:  Normocephalic and atraumatic. Eyes:  Sclera clear, no icterus.    Conjunctiva pink. Ears:  Normal auditory acuity. Nose:  No deformity, discharge,  or lesions. Msk:  Symmetrical without gross deformities. Normal posture. Extremities:  Without clubbing or edema. Neurologic:  Alert and  oriented x4;  grossly normal neurologically. Skin:  Intact without significant lesions or rashes. Psych:  Alert and cooperative. Normal mood and affect.  Impression/Plan:  Lance [name] is a 47 y.o. male with CAD with history of STEMI status post PCI, diabetes on insulin, hypertension, hyperlipidemia, GERD, morbid obesity and history of SVT who Is here for upper endoscopy and colonoscopy for evaluation of Iron deficiency anemia and abdominal pain   The risks of the procedure including infection, bleed, or perforation as well as benefits, limitations, alternatives and imponderables have been reviewed with the patient. Questions have been answered. All parties agreeable.

## 2023-08-06 NOTE — Anesthesia Procedure Notes (Signed)
Date/Time: 08/06/2023 12:59 PM  Performed by: Julian Reil, CRNAPre-anesthesia Checklist: Patient identified, Emergency Drugs available, Suction available and Patient being monitored Patient Re-evaluated:Patient Re-evaluated prior to induction Oxygen Delivery Method: Nasal cannula Induction Type: IV induction Placement Confirmation: positive ETCO2 Comments: Optiflow High Flow [place] O2 used

## 2023-08-06 NOTE — Transfer of Care (Signed)
Immediate Anesthesia Transfer of Care Note  Patient: FRISCO STAIGER  Procedure(s) Performed: ESOPHAGOGASTRODUODENOSCOPY (EGD) WITH PROPOFOL COLONOSCOPY WITH PROPOFOL BIOPSY POLYPECTOMY INTESTINAL HEMOSTASIS CLIP PLACEMENT  Patient Location: Short Stay  Anesthesia Type:General  Level of Consciousness: awake  Airway & Oxygen Therapy: Patient Spontanous Breathing  Post-op Assessment: Report given to RN and Post -op Vital signs reviewed and stable  Post vital signs: Reviewed and stable  Last Vitals:  Vitals Value Taken Time  BP 143/63 08/06/23 1352  Temp 36.6 C 08/06/23 1352  Pulse 71 08/06/23 1352  Resp 15 08/06/23 1352  SpO2 100 % 08/06/23 1352    Last Pain:  Vitals:   08/06/23 1352  TempSrc: Axillary  PainSc: 0-No pain      Patients Stated Pain Goal: 6 (08/06/23 1113)  Complications: No notable events documented.

## 2023-08-06 NOTE — Op Note (Signed)
[facility] Patient Name: Lance [name] Procedure Date: 08/06/2023 12:07 PM MRN: [id number] Date of Birth: Oct 27, 1975 Attending MD: Sanjuan Dame , MD, [id number] CSN: [id number] Age: 47 Admit Type: Outpatient Procedure:                Colonoscopy Indications:              Iron deficiency anemia Providers:                Sanjuan Dame, MD, Crystal Page, Zena Amos Referring MD:              Medicines:                Monitored Anesthesia Care Complications:            No immediate complications. Estimated Blood Loss:     Estimated blood loss was minimal. Procedure:                Pre-Anesthesia Assessment:                           - Prior to the procedure, a History and Physical                            was performed, and patient medications and                            allergies were reviewed. The patient's tolerance of                            previous anesthesia was also reviewed. The risks                            and benefits of the procedure and the sedation                            options and risks were discussed with the patient.                            All questions were answered, and informed consent                            was obtained. Prior Anticoagulants: The patient has                            taken Brilinta (ticagrelor), last dose was day of                            procedure. ASA Grade Assessment: III - A patient                            with severe systemic disease. After reviewing the                            risks and benefits, the patient was deemed in  satisfactory condition to undergo the procedure.                           After obtaining informed consent, the colonoscope                            was passed under direct vision. Throughout the                            procedure, the patient's blood pressure, pulse, and                            oxygen saturations were monitored continuously.  The                            [id number]) scope was introduced through the                            anus and advanced to the the cecum, identified by                            appendiceal orifice and ileocecal valve. The                            colonoscopy was performed without difficulty. The                            patient tolerated the procedure well. The quality                            of the bowel preparation was adequate to identify                            polyps greater than 5 mm in size. Scope In: 1:13:10 PM Scope Out: 1:48:27 PM Scope Withdrawal Time: 0 hours 31 minutes 38 seconds  Total Procedure Duration: 0 hours 35 minutes 17 seconds  Findings:      Three sessile polyps were found in the ascending colon. The polyps were       5 to 6 mm in size. These polyps were removed with a cold snare.       Resection and retrieval were complete.      A 6 mm polyp was found in the transverse colon. The polyp was sessile.       The polyp was removed with a cold snare. Resection and retrieval were       complete. For hemostasis, one hemostatic clip was successfully placed       (MR conditional). Clip manufacturer: [employer]. There was no       bleeding at the end of the procedure.      Non-bleeding external and internal hemorrhoids were found during       retroflexion. The hemorrhoids were small. Impression:               - Three 5 to 6 mm polyps in the ascending colon,  removed with a cold snare. Resected and retrieved.                           - One 6 mm polyp in the transverse colon, removed                            with a cold snare. Resected and retrieved. Clip (MR                            conditional) was placed. Clip manufacturer: [employer].                           - Non-bleeding external and internal hemorrhoids. Moderate Sedation:      Per Anesthesia Care Recommendation:           -  Patient has a contact number available for                            emergencies. The signs and symptoms of potential                            delayed complications were discussed with the                            patient. Return to normal activities tomorrow.                            Written discharge instructions were provided to the                            patient.                           - Resume previous diet.                           - Continue present medications.                           - Await pathology results.                           - Repeat colonoscopy in 1 year for surveillance                            given fair prep , with extended bowel prep .                           - Return to GI office as previously scheduled.                           -Start PO iron q 48 hours                           -  repeat Ferritin and CBC in 2 months , if                            inadeqauet response to PO iron than may consider                            small bowel endoscopy                           - No absolute GI contraindication to continuing                            clinically indicated anti-platelet and                            anticoagulation Procedure Code(s):        --- Professional ---                           [phone], Colonoscopy, flexible; with removal of                            tumor(s), polyp(s), or other lesion(s) by snare                            technique Diagnosis Code(s):        --- Professional ---                           D12.2, Benign neoplasm of ascending colon                           D12.3, Benign neoplasm of transverse colon (hepatic                            flexure or splenic flexure)                           K64.8, Other hemorrhoids                           D50.9, Iron deficiency anemia, unspecified CPT copyright 2022 American Medical Association. All rights reserved. The codes documented in this report are preliminary and upon  coder review may  be revised to meet current compliance requirements. Sanjuan Dame, MD Sanjuan Dame, MD 08/06/2023 2:02:31 PM This report has been signed electronically. Number of Addenda: 0

## 2023-08-07 LAB — SURGICAL PATHOLOGY

## 2023-08-10 ENCOUNTER — Encounter (INDEPENDENT_AMBULATORY_CARE_PROVIDER_SITE_OTHER): Payer: Self-pay | Admitting: *Deleted

## 2023-08-10 NOTE — Progress Notes (Signed)
I reviewed the pathology results. [name], can you send her a letter with the findings as described below please?  Repeat upper endoscopy and colonoscopy in 1 years  Thanks,  [name], MD Gastroenterology and Hepatology [facility]  ---------------------------------------------------------------------------------------------  [facility] [address], [address], [place], [place] [address] Phone:  [phone]   08/10/23 Lance [name], [place]   Dear [name],  I am writing to inform you that the biopsies taken during your recent endoscopic examination showed:  I am writing to let you know the results of your recent colonoscopy.  You had a total of 4 polyps removed. The pathology came back as "tubular adenoma." These findings are NOT cancer, but had the polyps remained in your colon, they could have turned into cancer.  Given these findings, it is recommended that your next colonoscopy be performed in 1 year  once you are able to discontinue Brilliant for 5 days , as the prep of your colon was fair   Also upper endoscopy showed hyperplastic polyps which were not removed as you were on blood thinners.  I recommend both upper endoscopy and colonoscopy to be done in 1 year once you are able to stop your blood thinner for 5 days  At this time I recommend iron tablets every other day - repeat blood work :ferritin and CBC in 2 months , if inadequate response to PO iron than may consider small bowel endoscopy  Please call us at [phone] if you have persistent problems or have questions about your condition that have not been fully answered at this time.  Sincerely,  [name], MD Gastroenterology and Hepatology

## 2023-08-11 ENCOUNTER — Encounter (HOSPITAL_COMMUNITY): Payer: Self-pay | Admitting: Gastroenterology

## 2023-08-11 NOTE — Anesthesia Postprocedure Evaluation (Signed)
Anesthesia Post Note  Patient: Lance [name]  Procedure(s) Performed: ESOPHAGOGASTRODUODENOSCOPY (EGD) WITH PROPOFOL COLONOSCOPY WITH PROPOFOL BIOPSY POLYPECTOMY INTESTINAL HEMOSTASIS CLIP PLACEMENT  Patient location during evaluation: Phase II Anesthesia Type: General Level of consciousness: awake Pain management: pain level controlled Vital Signs Assessment: post-procedure vital signs reviewed and stable Respiratory status: spontaneous breathing and respiratory function stable Cardiovascular status: blood pressure returned to baseline and stable Postop Assessment: no headache and no apparent nausea or vomiting Anesthetic complications: no Comments: Late entry   No notable events documented.   Last Vitals:  Vitals:   08/06/23 1113 08/06/23 1352  BP: (!) 142/70 (!) 143/63  Pulse: 70 71  Resp: 12 15  Temp: 36.7 C 36.6 C  SpO2: 97% 100%    Last Pain:  Vitals:   08/07/23 1239  TempSrc:   PainSc: 0-No pain                 Windell Norfolk

## 2023-08-13 ENCOUNTER — Encounter (INDEPENDENT_AMBULATORY_CARE_PROVIDER_SITE_OTHER): Payer: Self-pay | Admitting: *Deleted

## 2023-09-25 ENCOUNTER — Emergency Department (HOSPITAL_COMMUNITY)
Admission: EM | Admit: 2023-09-25 | Discharge: 2023-09-26 | Disposition: A | Discharge status: Home / Self Care | Payer: 59 | Attending: Emergency Medicine | Admitting: Emergency Medicine

## 2023-09-25 ENCOUNTER — Other Ambulatory Visit: Payer: Self-pay

## 2023-09-25 ENCOUNTER — Emergency Department (HOSPITAL_COMMUNITY): Payer: 59

## 2023-09-25 ENCOUNTER — Encounter (HOSPITAL_COMMUNITY): Payer: Self-pay

## 2023-09-25 DIAGNOSIS — Z7982 Long term (current) use of aspirin: Secondary | ICD-10-CM | POA: Insufficient documentation

## 2023-09-25 DIAGNOSIS — R0789 Other chest pain: Secondary | ICD-10-CM | POA: Diagnosis not present

## 2023-09-25 DIAGNOSIS — Z79899 Other long term (current) drug therapy: Secondary | ICD-10-CM | POA: Insufficient documentation

## 2023-09-25 DIAGNOSIS — R110 Nausea: Secondary | ICD-10-CM | POA: Diagnosis not present

## 2023-09-25 DIAGNOSIS — R1011 Right upper quadrant pain: Secondary | ICD-10-CM | POA: Diagnosis present

## 2023-09-25 DIAGNOSIS — Z794 Long term (current) use of insulin: Secondary | ICD-10-CM | POA: Diagnosis not present

## 2023-09-25 LAB — BASIC METABOLIC PANEL
Anion gap: 10 (ref 5–15)
BUN: 14 mg/dL (ref 6–20)
CO2: 25 mmol/L (ref 22–32)
Calcium: 8.8 mg/dL — ABNORMAL LOW (ref 8.9–10.3)
Chloride: 102 mmol/L (ref 98–111)
Creatinine, Ser: 1.09 mg/dL (ref 0.61–1.24)
GFR, Estimated: >60 mL/min (ref >60)
Glucose, Bld: 145 mg/dL — ABNORMAL HIGH (ref 70–99)
Potassium: 3.6 mmol/L (ref 3.5–5.1)
Sodium: 137 mmol/L (ref 135–145)

## 2023-09-25 LAB — CBC
HCT: 38.4 % — ABNORMAL LOW (ref 39.0–52.0)
Hemoglobin: 11.8 g/dL — ABNORMAL LOW (ref 13.0–17.0)
MCH: 25.2 pg — ABNORMAL LOW (ref 26.0–34.0)
MCHC: 30.7 g/dL (ref 30.0–36.0)
MCV: 82.1 fL (ref 80.0–100.0)
Platelets: 272 10*3/uL (ref 150–400)
RBC: 4.68 MIL/uL (ref 4.22–5.81)
RDW: 17.5 % — ABNORMAL HIGH (ref 11.5–15.5)
WBC: 13.7 10*3/uL — ABNORMAL HIGH (ref 4.0–10.5)
nRBC: 0 % (ref 0.0–0.2)

## 2023-09-25 LAB — TROPONIN I (HIGH SENSITIVITY): Troponin I (High Sensitivity): 14 ng/L (ref <18)

## 2023-09-25 NOTE — ED Triage Notes (Signed)
Chest pain LEFT side and radiates to ABD Started at rest  Had in the past when started ozempic. Stopped for 3 months  Started ozempic again 1 month ago

## 2023-09-26 LAB — HEPATIC FUNCTION PANEL
ALT: 26 U/L (ref 0–44)
AST: 21 U/L (ref 15–41)
Albumin: 3.7 g/dL (ref 3.5–5.0)
Alkaline Phosphatase: 94 U/L (ref 38–126)
Bilirubin, Direct: <0.1 mg/dL (ref 0.0–0.2)
Total Bilirubin: 0.4 mg/dL (ref <1.2)
Total Protein: 6.9 g/dL (ref 6.5–8.1)

## 2023-09-26 LAB — TROPONIN I (HIGH SENSITIVITY): Troponin I (High Sensitivity): 13 ng/L (ref <18)

## 2023-09-26 LAB — LIPASE, BLOOD: Lipase: 29 U/L (ref 11–51)

## 2023-09-26 NOTE — ED Provider Notes (Signed)
[facility] EMERGENCY DEPARTMENT AT [facility] Provider Note   CSN: [id number] Arrival date & time: 09/25/23  2111     History  Chief Complaint  Patient presents with   Chest Pain    Lance [name] is a 47 y.o. male.  47 year old male who presents ER today with abdominal pain.  Chief complaint the nursing was left-sided chest pain that radiates to his abdomen however he is adamant to me that he has right upper quadrant abdominal pain that sometimes radiates up into his right chest and maybe the epigastric area.  Nothing on the left.  Patient states that he had these issues in the past when taking Ozempic and had actually stopped it and got a couple months worth of workup which was all reassuring so he started back a few weeks ago.Marland Kitchen  He is on Ozempic for his diabetes.  He states he took his dose this morning this evening started having similar symptoms.  Associated nausea but no vomiting.  No diaphoresis or dyspnea.  Is gone at this time has been asymptomatic most the time is been in the ER.  No history of coronary disease.  No lower extremity swelling.  No other associated symptoms.   Chest Pain      Home Medications Prior to Admission medications   Medication Sig Start Date End Date Taking? Authorizing Provider  aspirin EC 81 MG tablet Take 1 tablet (81 mg total) by mouth daily with breakfast. 04/05/19   Mariea Clonts, Courage, MD  atorvastatin (LIPITOR) 80 MG tablet TAKE 1 TABLET BY MOUTH EVERYDAY AT BEDTIME 07/21/23   Jonelle Sidle, MD  blood glucose meter kit and supplies KIT 1 each by Does not apply route 4 (four) times daily. Dispense based on patient and insurance preference. Use up to four times daily as directed. (FOR ICD-9 250.00, 250.01). 02/14/20   Roma Kayser, MD  Continuous Glucose Sensor (DEXCOM G7 SENSOR) MISC CHANGE SENSOR EVERY 10 DAYS 03/10/23   Roma Kayser, MD  famotidine (PEPCID) 40 MG tablet Take 40 mg by mouth daily. 04/30/23   [provider]  ferrous sulfate 325 (65 FE) MG tablet Take 1 tablet (325 mg total) by mouth every other day. 08/06/23 11/04/23  Franky Macho, MD  insulin aspart (NOVOLOG FLEXPEN) 100 UNIT/ML FlexPen Inject 20-26 Units into the skin 3 (three) times daily with meals. 07/21/23   Roma Kayser, MD  insulin glargine (LANTUS SOLOSTAR) 100 UNIT/ML Solostar Pen INJECT 70 UNITS INTO THE SKIN AT BEDTIME. 07/21/23   Nida, Denman George, MD  Insulin Pen Needle (B-D ULTRAFINE III SHORT PEN) 31G X 8 MM MISC USE TO INJECT INSULIN 4 (FOUR) TIMES DAILY. 12/10/22   Roma Kayser, MD  lisinopril (ZESTRIL) 40 MG tablet TAKE 1 TABLET BY MOUTH EVERY DAY 05/29/23   Jonelle Sidle, MD  nitroGLYCERIN (NITROSTAT) 0.4 MG SL tablet Place 1 tablet (0.4 mg total) under the tongue every 5 (five) minutes x 3 doses as needed for chest pain. 09/02/21   Jonelle Sidle, MD  omeprazole (PRILOSEC) 40 MG capsule Take 40 mg by mouth daily. 05/18/23   [provider]  polyethylene glycol powder (GLYCOLAX/MIRALAX) 17 GM/SCOOP powder Take 8.5 g by mouth in the morning and at bedtime. 08/06/23 11/04/23  Franky Macho, MD  Sodium Sulfate-Mag Sulfate-KCl (SUTAB) [phone] MG TABS As directed 07/30/23   Franky Macho, MD  ticagrelor (BRILINTA) 90 MG TABS tablet Take 1 tablet (90 mg total) by  mouth 2 (two) times daily. 01/26/23   Sharlene Dory, NP      Allergies    Bee venom and Penicillins    Review of Systems   Review of Systems  Cardiovascular:  Positive for chest pain.    Physical Exam Updated Vital Signs BP 130/62 (BP Location: Left Arm)   Pulse 68   Temp 97.8 F (36.6 C) (Oral)   Resp 18   Ht 5\' 11"  (1.803 m)   Wt 127 kg   SpO2 98%   BMI 39.05 kg/m  Physical Exam Vitals and nursing note reviewed.  Constitutional:      Appearance: He is well-developed.  HENT:     Head: Normocephalic and atraumatic.  Cardiovascular:     Rate and Rhythm: Normal rate.  Pulmonary:     Effort:  Pulmonary effort is normal. No respiratory distress.     Breath sounds: No decreased breath sounds or wheezing.  Chest:     Chest wall: No tenderness.  Abdominal:     General: There is no distension.     Tenderness: There is no abdominal tenderness.  Musculoskeletal:        General: Normal range of motion.     Cervical back: Normal range of motion.  Neurological:     Mental Status: He is alert.     ED Results / Procedures / Treatments   Labs (all labs ordered are listed, but only abnormal results are displayed) Labs Reviewed  BASIC METABOLIC PANEL - Abnormal; Notable for the following components:      Result Value   Glucose, Bld 145 (*)    Calcium 8.8 (*)    All other components within normal limits  CBC - Abnormal; Notable for the following components:   WBC 13.7 (*)    Hemoglobin 11.8 (*)    HCT 38.4 (*)    MCH 25.2 (*)    RDW 17.5 (*)    All other components within normal limits  LIPASE, BLOOD  HEPATIC FUNCTION PANEL  TROPONIN I (HIGH SENSITIVITY)  TROPONIN I (HIGH SENSITIVITY)    EKG EKG Interpretation Date/Time:  Friday September 25 2023 21:20:15 EST Ventricular Rate:  86 PR Interval:  186 QRS Duration:  96 QT Interval:  392 QTC Calculation: 469 R Axis:   100  Text Interpretation: Normal sinus rhythm Rightward axis Borderline ECG No significant change since last tracing Confirmed by Melene Plan [phone]) on 09/26/2023 2:25:20 PM  Radiology DG Chest 2 View Result Date: 09/25/2023 CLINICAL DATA:  Left-sided chest pain. EXAM: CHEST - 2 VIEW COMPARISON:  June 27, 2023 FINDINGS: The heart size and mediastinal contours are within normal limits. There is no evidence of acute infiltrate, pleural effusion or pneumothorax. The visualized skeletal structures are unremarkable. IMPRESSION: No active cardiopulmonary disease. Electronically Signed   By: Aram Candela M.D.   On: 09/25/2023 21:56    Procedures Procedures    Medications Ordered in ED Medications -  No data to display  ED Course/ Medical Decision Making/ A&P                                 Medical Decision Making Amount and/or Complexity of Data Reviewed Labs: ordered. Radiology: ordered.   Cardiac workup completed without any evidence of ACS.  Low suspicion for PE, pneumonia or other associate symptoms without any breathing component.  She certainly seems GI in nature seems to be pretty well correlated with taking  the Ozempic previously and now.  Has not liver enzymes in lipase were within normal limits making any kind of emergent etiology such as pancreatitis or cholecystitis unlikely.  At this time we will continue following up with his primary doctor for the same.  As there is no severe damage done secondary to left him the option to continue it or not.  He will discuss it with his outpatient doctors.  Stable at time of discharge.        Final Clinical Impression(s) / ED Diagnoses Final diagnoses:  Right upper quadrant abdominal pain    Rx / DC Orders ED Discharge Orders     None         Temiloluwa Laredo, Barbara Cower, MD 09/26/23 2132

## 2023-09-28 ENCOUNTER — Other Ambulatory Visit: Payer: Self-pay | Admitting: "Endocrinology

## 2023-09-28 DIAGNOSIS — E1159 Type 2 diabetes mellitus with other circulatory complications: Secondary | ICD-10-CM

## 2023-10-01 ENCOUNTER — Other Ambulatory Visit: Payer: Self-pay | Admitting: "Endocrinology

## 2023-10-01 DIAGNOSIS — E1165 Type 2 diabetes mellitus with hyperglycemia: Secondary | ICD-10-CM

## 2023-10-21 ENCOUNTER — Telehealth: Payer: Self-pay

## 2023-10-21 NOTE — Telephone Encounter (Signed)
 Pt came by the office stating he has been comparing his CGM readings with finger sticks. Stating he had noticed anywhere from a 60-150 point difference. Pt was shown how to calibrate his CGM device by diabetic educator. Pt's CGM device showed his glucose at 116 and fingerstick from clinic glucometer resulted 188. Pt's CGM data uploaded and given to Dr.Nida along with discussion of difference in results. Advised pt to increase his Lantus  to 80 units at bedtime per Dr.Nida.

## 2023-10-30 ENCOUNTER — Other Ambulatory Visit (INDEPENDENT_AMBULATORY_CARE_PROVIDER_SITE_OTHER): Payer: Self-pay | Admitting: Gastroenterology

## 2023-10-30 ENCOUNTER — Other Ambulatory Visit: Payer: Self-pay | Admitting: Cardiology

## 2023-10-30 ENCOUNTER — Other Ambulatory Visit: Payer: Self-pay | Admitting: Nurse Practitioner

## 2023-10-30 ENCOUNTER — Other Ambulatory Visit: Payer: Self-pay | Admitting: "Endocrinology

## 2023-10-30 DIAGNOSIS — E1165 Type 2 diabetes mellitus with hyperglycemia: Secondary | ICD-10-CM

## 2023-11-14 ENCOUNTER — Emergency Department (HOSPITAL_COMMUNITY)
Admission: EM | Admit: 2023-11-14 | Discharge: 2023-11-14 | Disposition: A | Discharge status: Home / Self Care | Payer: 59 | Attending: Emergency Medicine | Admitting: Emergency Medicine

## 2023-11-14 ENCOUNTER — Emergency Department (HOSPITAL_COMMUNITY): Payer: 59

## 2023-11-14 DIAGNOSIS — Z794 Long term (current) use of insulin: Secondary | ICD-10-CM | POA: Diagnosis not present

## 2023-11-14 DIAGNOSIS — R1084 Generalized abdominal pain: Secondary | ICD-10-CM | POA: Diagnosis not present

## 2023-11-14 DIAGNOSIS — R110 Nausea: Secondary | ICD-10-CM | POA: Diagnosis not present

## 2023-11-14 DIAGNOSIS — Z7982 Long term (current) use of aspirin: Secondary | ICD-10-CM | POA: Insufficient documentation

## 2023-11-14 DIAGNOSIS — I2510 Atherosclerotic heart disease of native coronary artery without angina pectoris: Secondary | ICD-10-CM | POA: Diagnosis not present

## 2023-11-14 DIAGNOSIS — E119 Type 2 diabetes mellitus without complications: Secondary | ICD-10-CM | POA: Diagnosis not present

## 2023-11-14 DIAGNOSIS — R0789 Other chest pain: Secondary | ICD-10-CM | POA: Diagnosis present

## 2023-11-14 DIAGNOSIS — I10 Essential (primary) hypertension: Secondary | ICD-10-CM | POA: Diagnosis not present

## 2023-11-14 DIAGNOSIS — Z79899 Other long term (current) drug therapy: Secondary | ICD-10-CM | POA: Diagnosis not present

## 2023-11-14 LAB — COMPREHENSIVE METABOLIC PANEL
ALT: 35 U/L (ref 0–44)
AST: 25 U/L (ref 15–41)
Albumin: 3.7 g/dL (ref 3.5–5.0)
Alkaline Phosphatase: 83 U/L (ref 38–126)
Anion gap: 9 (ref 5–15)
BUN: 13 mg/dL (ref 6–20)
CO2: 26 mmol/L (ref 22–32)
Calcium: 8.9 mg/dL (ref 8.9–10.3)
Chloride: 102 mmol/L (ref 98–111)
Creatinine, Ser: 1.02 mg/dL (ref 0.61–1.24)
GFR, Estimated: >60 mL/min (ref >60)
Glucose, Bld: 174 mg/dL — ABNORMAL HIGH (ref 70–99)
Potassium: 4.2 mmol/L (ref 3.5–5.1)
Sodium: 137 mmol/L (ref 135–145)
Total Bilirubin: 1 mg/dL (ref 0.0–1.2)
Total Protein: 6.7 g/dL (ref 6.5–8.1)

## 2023-11-14 LAB — CBC
HCT: 39.7 % (ref 39.0–52.0)
Hemoglobin: 12.7 g/dL — ABNORMAL LOW (ref 13.0–17.0)
MCH: 26.2 pg (ref 26.0–34.0)
MCHC: 32 g/dL (ref 30.0–36.0)
MCV: 82 fL (ref 80.0–100.0)
Platelets: 225 10*3/uL (ref 150–400)
RBC: 4.84 MIL/uL (ref 4.22–5.81)
RDW: 15.3 % (ref 11.5–15.5)
WBC: 11.6 10*3/uL — ABNORMAL HIGH (ref 4.0–10.5)
nRBC: 0 % (ref 0.0–0.2)

## 2023-11-14 LAB — TROPONIN I (HIGH SENSITIVITY): Troponin I (High Sensitivity): 12 ng/L (ref <18)

## 2023-11-14 MED ORDER — ONDANSETRON 4 MG PO TBDP: 4.0000 mg | ORAL_TABLET | Freq: Three times a day (TID) | ORAL | 0 refills | Status: DC | PRN

## 2023-11-14 NOTE — ED Provider Notes (Signed)
 [place] [facility] AT [facility] Provider Note   CSN: [id number] Arrival date & time: 11/14/23  1125     History  Chief Complaint  Patient presents with   Chest Pain    Lance [name] is a 48 y.o. male with PMH as listed below who presents with feeling sick yesterday. +nausea, no vomiting. Had an episode earlier where he felt like his sugar was dropping, ate a honeybun and immediately felt better.  Also c/o wandering chest pain, not there anymore, lasted only one minute, happened 2-3 times. No association with exertion. No h/o similar chest pain. He has been on ozempic  for diabetes and has dealt with abdominal pain from that, stopped that 3 weeks ago. No recent travel, surgeries/hospitalizations. Takes brillinta and aspirin . Has had a heart attack in the past but this feels not like that at all.    Past Medical History:  Diagnosis Date   CAD (coronary artery disease), native coronary artery    10/18 PCI/DES to mRCA, and OM, with total occlusion of dLCx with collaterals.    Essential hypertension    GERD (gastroesophageal reflux disease)    History of kidney stones    Hyperlipemia    MI (myocardial infarction) (HCC) 2020   SVT (supraventricular tachycardia) (HCC) 2015   Converted with adenosine    Type 2 diabetes mellitus (HCC)        Home Medications Prior to Admission medications   Medication Sig Start Date End Date Taking? Authorizing Provider  aspirin  EC 81 MG tablet Take 1 tablet (81 mg total) by mouth daily with breakfast. 04/05/19   Pearlean, Courage, MD  atorvastatin  (LIPITOR ) 80 MG tablet TAKE 1 TABLET BY MOUTH EVERYDAY AT BEDTIME 07/21/23   Debera Jayson MATSU, MD  B-D ULTRAFINE III SHORT PEN 31G X 8 MM MISC USE TO INJECT INSULIN  4 (FOUR) TIMES DAILY. 10/01/23   Nida, Gebreselassie W, MD  blood glucose meter kit and supplies KIT 1 each by Does not apply route 4 (four) times daily. Dispense based on patient and insurance preference. Use up to four  times daily as directed. (FOR ICD-9 250.00, 250.01). 02/14/20   Lenis Ethelle ORN, MD  BRILINTA  90 MG TABS tablet TAKE 1 TABLET BY MOUTH 2 TIMES DAILY. 10/30/23   Miriam Norris, NP  Continuous Glucose Sensor (DEXCOM G7 SENSOR) MISC CHANGE SENSOR EVERY 10 DAYS 09/28/23   Nida, Gebreselassie W, MD  famotidine  (PEPCID ) 40 MG tablet Take 40 mg by mouth daily. 04/30/23   [provider]  ferrous sulfate  325 (65 FE) MG tablet Take 1 tablet (325 mg total) by mouth every other day. 08/06/23 11/04/23  Ahmed, Muhammad F, MD  insulin  aspart (NOVOLOG  FLEXPEN) 100 UNIT/ML FlexPen INJECT 20-26 UNITS INTO THE SKIN 3 (THREE) TIMES DAILY WITH MEALS. 11/02/23   Nida, Gebreselassie W, MD  insulin  glargine (LANTUS  SOLOSTAR) 100 UNIT/ML Solostar Pen Inject 80 Units into the skin at bedtime. 11/02/23   Nida, Gebreselassie W, MD  lisinopril  (ZESTRIL ) 40 MG tablet TAKE 1 TABLET BY MOUTH EVERY DAY 10/30/23   Debera Jayson MATSU, MD  nitroGLYCERIN  (NITROSTAT ) 0.4 MG SL tablet Place 1 tablet (0.4 mg total) under the tongue every 5 (five) minutes x 3 doses as needed for chest pain. 09/02/21   Debera Jayson MATSU, MD  omeprazole  (PRILOSEC) 40 MG capsule Take 40 mg by mouth daily. 05/18/23   [provider]  Sodium Sulfate-Mag Sulfate-KCl (SUTAB ) [id number] MG TABS As directed 07/30/23   Ahmed, Deatrice FALCON, MD  Allergies    Bee venom and Penicillins    Review of Systems   Review of Systems A 10 point review of systems was performed and is negative unless otherwise reported in HPI.  Physical Exam Updated Vital Signs BP 129/64 (BP Location: Right Arm)   Pulse 67   Temp 98.3 F (36.8 C) (Oral)   Resp 18   Ht 5' 11 (1.803 m)   Wt 131.5 kg   SpO2 99%   BMI 40.45 kg/m  Physical Exam General: Normal appearing obese male, lying in bed.  HEENT: Sclera anicteric, MMM, trachea midline.  Cardiology: RRR, no murmurs/rubs/gallops.  Resp: Normal respiratory rate and effort. CTAB, no wheezes, rhonchi,  crackles.  Abd: Soft, non-tender, non-distended. No rebound tenderness or guarding.  GU: Deferred. MSK: No peripheral edema or signs of trauma. Skin: warm, dry. Back: No CVA tenderness Neuro: A&Ox4, CNs II-XII grossly intact. MAEs. Sensation grossly intact.  Psych: Normal mood and affect.   ED Results / Procedures / Treatments   Labs (all labs ordered are listed, but only abnormal results are displayed) Labs Reviewed  CBC - Abnormal; Notable for the following components:      Result Value   WBC 11.6 (*)    Hemoglobin 12.7 (*)    All other components within normal limits  COMPREHENSIVE METABOLIC PANEL - Abnormal; Notable for the following components:   Glucose, Bld 174 (*)    All other components within normal limits  TROPONIN I (HIGH SENSITIVITY)  TROPONIN I (HIGH SENSITIVITY)    EKG EKG Interpretation Date/Time:  Saturday November 14 2023 11:46:12 EST Ventricular Rate:  68 PR Interval:  176 QRS Duration:  92 QT Interval:  398 QTC Calculation: 423 R Axis:   110  Text Interpretation: Normal sinus rhythm Right axis deviation Incomplete right bundle branch block Abnormal QRS-T angle, consider primary T wave abnormality Abnormal ECG When compared with ECG of 25-Sep-2023 21:20, Inverted T waves have replaced nonspecific T wave abnormality in Inferior leads Confirmed by Franklyn Gills [phone]) on 11/14/2023 11:50:00 AM  Radiology DG Chest 2 View Result Date: 11/14/2023 CLINICAL DATA:  Chest and epigastric pain beginning yesterday. Previous myocardial infarct. EXAM: CHEST - 2 VIEW COMPARISON:  09/25/2023 FINDINGS: The heart size and mediastinal contours are within normal limits. Both lungs are clear. The visualized skeletal structures are unremarkable. IMPRESSION: No active cardiopulmonary disease. Electronically Signed   By: Norleen DELENA Kil M.D.   On: 11/14/2023 12:11    Procedures Procedures    Medications Ordered in ED Medications - No data to display  ED Course/ Medical Decision  Making/ A&P                          Medical Decision Making Amount and/or Complexity of Data Reviewed Labs: ordered. Decision-making details documented in ED Course. Radiology: ordered.  Risk Prescription drug management.    This patient presents to the ED for concern of CP, N/V, this involves an extensive number of treatment options, and is a complaint that carries with it a high risk of complications and morbidity.  I considered the following differential and admission for this acute, potentially life threatening condition. Patient is asymptomatic currently and HDS, overall well-appearing.  MDM:    DDX for chest pain includes but is not limited to:   Very low suspicion for ACS vs aortic dissection given presenting sx. Patient can PERC out, and he has minimal risk factors for PE. Lower c/f dissection with no widened  mediastinum on CXR, no pulse deficits, no neurological symptoms, no CP radiating to back. CXR doesn't show any PNA, pulm edema, pleural effusion. He is currently asymptomatic and is relieved that trop neg, and he declines any further testing. Will have him f/u with cardiology as an outpatient.    Clinical Course as of 11/20/23 1310  Sat Nov 14, 2023  1338 Troponin I (High Sensitivity): 12 [HN]  1338 WBC(!): 11.6 Mildly elevated leukocytosis [HN]  1338 Comprehensive metabolic panel(!) Unremarkable in the context of this patient's presentation  [HN]  1409 Patient does not want another any further imaging or testing. I explained the reasoning behind the repeat troponin but he feels well and he does not want it done.  [HN]    Clinical Course User Index [HN] Franklyn Sid SAILOR, MD    Labs: I Ordered, and personally interpreted labs.  The pertinent results include:  those listed above  Imaging Studies ordered: I ordered imaging studies including chest XR I independently visualized and interpreted imaging. I agree with the radiologist interpretation  Additional  history obtained from chart review.    Cardiac Monitoring: The patient was maintained on a cardiac monitor.  I personally viewed and interpreted the cardiac monitored which showed an underlying rhythm of: NSR  Reevaluation: After the interventions noted above, I reevaluated the patient and found that they have :resolved  Social Determinants of Health: Lives independently  Disposition:  DC w/ discharge instructions/return precautions. All questions answered to patient's satisfaction.    Co morbidities that complicate the patient evaluation  Past Medical History:  Diagnosis Date   CAD (coronary artery disease), native coronary artery    10/18 PCI/DES to mRCA, and OM, with total occlusion of dLCx with collaterals.    Essential hypertension    GERD (gastroesophageal reflux disease)    History of kidney stones    Hyperlipemia    MI (myocardial infarction) (HCC) 2020   SVT (supraventricular tachycardia) (HCC) 2015   Converted with adenosine    Type 2 diabetes mellitus (HCC)      Medicines No orders of the defined types were placed in this encounter.   I have reviewed the patients home medicines and have made adjustments as needed  Problem List / ED Course: Problem List Items Addressed This Visit       Other   Atypical chest pain - Primary   Other Visit Diagnoses       Generalized abdominal pain         Nausea                       This note was created using dictation software, which may contain spelling or grammatical errors.    Franklyn Sid SAILOR, MD 11/20/23 [phone]

## 2023-11-14 NOTE — ED Triage Notes (Signed)
 Pt c/o central CP radiating into epigastric area since yesterday. Pt denies SOB, N/V, diaphoresis. Pt reports hx of MI with multiple stents.

## 2023-11-14 NOTE — Discharge Instructions (Addendum)
 Thank you for coming to [facility] Emergency Department. You were seen for chest pain, abdominal pain. We did an exam, labs, and imaging, and these showed no acute findings. You were recommended to have a repeat troponin level but you declined. You can take tylenol  1,000 mg every 8 hours for pain. We have prescribed zofran  4 mg under the tongue every 6-8 hours as needed for nausea/vomiting. Please follow up with your primary care provider within 1 week.   Do not hesitate to return to the ED or call 911 if you experience: -Worsening symptoms -Lightheadedness, passing out -Fevers/chills -Anything else that concerns you

## 2023-11-16 ENCOUNTER — Ambulatory Visit: Payer: 59 | Admitting: "Endocrinology

## 2023-11-19 ENCOUNTER — Ambulatory Visit: Payer: 59 | Admitting: "Endocrinology

## 2023-11-19 ENCOUNTER — Emergency Department (HOSPITAL_COMMUNITY)
Admission: EM | Admit: 2023-11-19 | Discharge: 2023-11-19 | Disposition: A | Discharge status: Home / Self Care | Payer: 59 | Attending: Student | Admitting: Student

## 2023-11-19 ENCOUNTER — Other Ambulatory Visit: Payer: Self-pay

## 2023-11-19 ENCOUNTER — Emergency Department (HOSPITAL_COMMUNITY): Payer: 59

## 2023-11-19 ENCOUNTER — Encounter (HOSPITAL_COMMUNITY): Payer: Self-pay | Admitting: *Deleted

## 2023-11-19 DIAGNOSIS — I110 Hypertensive heart disease with heart failure: Secondary | ICD-10-CM | POA: Diagnosis not present

## 2023-11-19 DIAGNOSIS — R079 Chest pain, unspecified: Secondary | ICD-10-CM | POA: Diagnosis present

## 2023-11-19 DIAGNOSIS — F17210 Nicotine dependence, cigarettes, uncomplicated: Secondary | ICD-10-CM | POA: Diagnosis not present

## 2023-11-19 DIAGNOSIS — Z79899 Other long term (current) drug therapy: Secondary | ICD-10-CM | POA: Diagnosis not present

## 2023-11-19 DIAGNOSIS — E119 Type 2 diabetes mellitus without complications: Secondary | ICD-10-CM | POA: Insufficient documentation

## 2023-11-19 DIAGNOSIS — I2510 Atherosclerotic heart disease of native coronary artery without angina pectoris: Secondary | ICD-10-CM | POA: Diagnosis not present

## 2023-11-19 DIAGNOSIS — R0789 Other chest pain: Secondary | ICD-10-CM | POA: Diagnosis not present

## 2023-11-19 DIAGNOSIS — I509 Heart failure, unspecified: Secondary | ICD-10-CM | POA: Insufficient documentation

## 2023-11-19 DIAGNOSIS — R1013 Epigastric pain: Secondary | ICD-10-CM | POA: Diagnosis not present

## 2023-11-19 DIAGNOSIS — Z794 Long term (current) use of insulin: Secondary | ICD-10-CM | POA: Insufficient documentation

## 2023-11-19 DIAGNOSIS — Z7982 Long term (current) use of aspirin: Secondary | ICD-10-CM | POA: Diagnosis not present

## 2023-11-19 LAB — CBC
HCT: 39.9 % (ref 39.0–52.0)
Hemoglobin: 12.5 g/dL — ABNORMAL LOW (ref 13.0–17.0)
MCH: 25.6 pg — ABNORMAL LOW (ref 26.0–34.0)
MCHC: 31.3 g/dL (ref 30.0–36.0)
MCV: 81.6 fL (ref 80.0–100.0)
Platelets: 226 10*3/uL (ref 150–400)
RBC: 4.89 MIL/uL (ref 4.22–5.81)
RDW: 14.7 % (ref 11.5–15.5)
WBC: 9.5 10*3/uL (ref 4.0–10.5)
nRBC: 0 % (ref 0.0–0.2)

## 2023-11-19 LAB — COMPREHENSIVE METABOLIC PANEL
ALT: 32 U/L (ref 0–44)
AST: 23 U/L (ref 15–41)
Albumin: 3.9 g/dL (ref 3.5–5.0)
Alkaline Phosphatase: 97 U/L (ref 38–126)
Anion gap: 7 (ref 5–15)
BUN: 14 mg/dL (ref 6–20)
CO2: 26 mmol/L (ref 22–32)
Calcium: 9.1 mg/dL (ref 8.9–10.3)
Chloride: 102 mmol/L (ref 98–111)
Creatinine, Ser: 0.98 mg/dL (ref 0.61–1.24)
GFR, Estimated: >60 mL/min (ref >60)
Glucose, Bld: 235 mg/dL — ABNORMAL HIGH (ref 70–99)
Potassium: 4.4 mmol/L (ref 3.5–5.1)
Sodium: 135 mmol/L (ref 135–145)
Total Bilirubin: 0.7 mg/dL (ref 0.0–1.2)
Total Protein: 7 g/dL (ref 6.5–8.1)

## 2023-11-19 LAB — PROTIME-INR
INR: 1 (ref 0.8–1.2)
Prothrombin Time: 13 s (ref 11.4–15.2)

## 2023-11-19 LAB — LIPASE, BLOOD: Lipase: 29 U/L (ref 11–51)

## 2023-11-19 LAB — TROPONIN I (HIGH SENSITIVITY): Troponin I (High Sensitivity): 12 ng/L (ref <18)

## 2023-11-19 MED ORDER — LIDOCAINE VISCOUS HCL 2 % MT SOLN
15.0000 mL | Freq: Once | OROMUCOSAL | Status: AC
  Administered 2023-11-19: 15 mL via ORAL
  Filled 2023-11-19: qty 15

## 2023-11-19 MED ORDER — ALUM & MAG HYDROXIDE-SIMETH 200-200-20 MG/5ML PO SUSP
30.0000 mL | Freq: Once | ORAL | Status: AC
  Administered 2023-11-19: 30 mL via ORAL
  Filled 2023-11-19: qty 30

## 2023-11-19 MED ORDER — MAALOX MAX 400-400-40 MG/5ML PO SUSP: 15.0000 mL | Freq: Four times a day (QID) | ORAL | 0 refills | Status: DC | PRN

## 2023-11-19 NOTE — ED Triage Notes (Addendum)
Pt c/o right sided chest pain and upper abdominal pain that radiates down to his mid abdomen since yesterday. Pt reports SOB when the pain starts. Denies n/v/d, dizziness.

## 2023-11-19 NOTE — ED Provider Notes (Signed)
[place] [facility]ER[facility]ENC[facility] DE[facility]MENT AT [facility] Provider Note  CSN: [id number] Arrival date & time: 11/19/23 4098  Chief Complaint(s) Chest Pain  HPI NEITHAN DAY is a 48 y.o. male with PMH CAD status post MI with DES placement, GERD, HLD, T2DM, SVT who presents Emergency Department for evaluation of burning chest pain and right upper quadrant abdominal pain.  States that pain began at 3 PM yesterday after eating not shows.  Pain primarily in the epigastrium.  States that this feels similar to last time he was seen on 11/14/2023.  He states that his pain was worse yesterday and is since resolved.  Endorses a mild pressure-like sensation in the epigastrium but otherwise is denying nausea, vomiting, shortness of breath, diaphoresis or any exertional component to symptoms.   Past Medical History Past Medical History:  Diagnosis Date   CAD (coronary artery disease), native coronary artery    10/18 PCI/DES to mRCA, and OM, with total occlusion of dLCx with collaterals.    Essential hypertension    GERD (gastroesophageal reflux disease)    History of kidney stones    Hyperlipemia    MI (myocardial infarction) (HCC) 2020   SVT (supraventricular tachycardia) (HCC) 2015   Converted with adenosine   Type 2 diabetes mellitus (HCC)    Patient Active Problem List   Diagnosis Date Noted   Gastritis and gastroduodenitis 08/06/2023   Adenomatous polyp of sigmoid colon 08/06/2023   Insulin long-term use (HCC) 03/04/2023   Atypical chest pain 02/06/2023   Chronic HFimpEF 01/17/2023   Obesity (BMI 30-39.9) 01/16/2023   Chest pain at rest 01/16/2023   Unstable angina (HCC) 01/16/2023   Chest pain 01/15/2023   DM type 2 causing vascular disease (HCC) 10/23/2021   SVT (supraventricular tachycardia) (HCC) 04/20/2019   Ischemic cardiomyopathy 04/20/2019   Atrial tachycardia (HCC) 04/03/2019   Acute ST elevation myocardial infarction (STEMI) involving left main coronary artery (HCC)    VF  (ventricular fibrillation) (HCC)    Loud snoring    STEMI (ST elevation myocardial infarction) (HCC) 03/12/2019   STEMI involving left anterior descending coronary artery (HCC) 03/12/2019   Incarcerated incisional hernia s/p redeuction/primary repair 11/12/2017 11/12/2017   Chronic anticoagulation (Plavix) 11/12/2017   Chronic appendicitis s/p lap appendectomy 11/12/2017 09/05/2017   S/P drug eluting coronary stent placement    Uncontrolled type 2 diabetes mellitus with hyperglycemia (HCC) 08/25/2017   CAD (coronary artery disease) 07/15/2017   Morbid obesity (HCC) 05/20/2017   Family history of early CAD 12/29/2016   Type 2 diabetes mellitus, uncontrolled 08/31/2016   Mixed hyperlipidemia 08/31/2016   Current smoker 08/31/2016   Essential hypertension, benign 04/21/2013   Home Medication(s) Prior to Admission medications   Medication Sig Start Date End Date Taking? Authorizing Provider  alum & mag hydroxide-simeth (MAALOX MAX) 400-400-40 MG/5ML suspension Take 15 mLs by mouth every 6 (six) hours as needed for indigestion. 11/19/23  Yes Adisen Bennion, MD  ALPRAZolam Prudy Feeler) 1 MG tablet Take 1 mg by mouth 2 (two) times daily. 11/05/23   [provider]  aspirin EC 81 MG tablet Take 1 tablet (81 mg total) by mouth daily with breakfast. 04/05/19   Mariea Clonts, Courage, MD  atorvastatin (LIPITOR) 80 MG tablet TAKE 1 TABLET BY MOUTH EVERYDAY AT BEDTIME 07/21/23   Jonelle Sidle, MD  B-D ULTRAFINE III SHORT PEN 31G X 8 MM MISC USE TO INJECT INSULIN 4 (FOUR) TIMES DAILY. 10/01/23   Roma Kayser, MD  blood glucose meter kit and supplies  KIT 1 each by Does not apply route 4 (four) times daily. Dispense based on patient and insurance preference. Use up to four times daily as directed. (FOR ICD-9 250.00, 250.01). 02/14/20   Nida, Denman George, MD  BRILINTA 90 MG TABS tablet TAKE 1 TABLET BY MOUTH 2 TIMES DAILY. 10/30/23   Sharlene Dory, NP  Continuous Glucose Sensor (DEXCOM G7 SENSOR)  MISC CHANGE SENSOR EVERY 10 DAYS 09/28/23   Roma Kayser, MD  famotidine (PEPCID) 40 MG tablet Take 40 mg by mouth daily. 04/30/23   [provider]  ferrous sulfate 325 (65 FE) MG tablet Take 1 tablet (325 mg total) by mouth every other day. 08/06/23 11/04/23  Ahmed, Juanetta Beets, MD  insulin aspart (NOVOLOG FLEXPEN) 100 UNIT/ML FlexPen INJECT 20-26 UNITS INTO THE SKIN 3 (THREE) TIMES DAILY WITH MEALS. 11/02/23   Nida, Denman George, MD  insulin glargine (LANTUS SOLOSTAR) 100 UNIT/ML Solostar Pen Inject 80 Units into the skin at bedtime. 11/02/23   Roma Kayser, MD  lisinopril (ZESTRIL) 40 MG tablet TAKE 1 TABLET BY MOUTH EVERY DAY 10/30/23   Jonelle Sidle, MD  nitroGLYCERIN (NITROSTAT) 0.4 MG SL tablet Place 1 tablet (0.4 mg total) under the tongue every 5 (five) minutes x 3 doses as needed for chest pain. 09/02/21   Jonelle Sidle, MD  omeprazole (PRILOSEC) 40 MG capsule Take 40 mg by mouth daily. 05/18/23   [provider]  ondansetron (ZOFRAN-ODT) 4 MG disintegrating tablet Take 1 tablet (4 mg total) by mouth every 8 (eight) hours as needed. 11/14/23   Loetta Rough, MD  OZEMPIC, 0.25 OR 0.5 MG/DOSE, 2 MG/3ML SOPN Inject 2 mg into the skin once a week. 11/08/23   [provider]  Sodium Sulfate-Mag Sulfate-KCl (SUTAB) [phone] MG TABS As directed 07/30/23   Franky Macho, MD                                                                                                                                    Past Surgical History Past Surgical History:  Procedure Laterality Date   BIOPSY  08/06/2023   Procedure: BIOPSY;  Surgeon: Franky Macho, MD;  Location: [facility];  Service: Endoscopy;;   CARDIAC SURGERY     COLONOSCOPY WITH PROPOFOL N/A 08/06/2023   Procedure: COLONOSCOPY WITH PROPOFOL;  Surgeon: Franky Macho, MD;  Location: [facility];  Service: Endoscopy;  Laterality: N/A;   CORONARY ANGIOPLASTY WITH STENT PLACEMENT   07/15/2017   "2 stents"   CORONARY BALLOON ANGIOPLASTY N/A 01/16/2023   Procedure: CORONARY BALLOON ANGIOPLASTY;  Surgeon: Lennette Bihari, MD;  Location: [facility];  Service: Cardiovascular;  Laterality: N/A;   CORONARY/GRAFT ACUTE MI REVASCULARIZATION N/A 03/12/2019   Procedure: Coronary/Graft Acute MI Revascularization;  Surgeon: Yvonne Kendall, MD;  Location: [facility];  Service: Cardiovascular;  Laterality: N/A;   ESOPHAGOGASTRODUODENOSCOPY (EGD) WITH PROPOFOL N/A  08/06/2023   Procedure: ESOPHAGOGASTRODUODENOSCOPY (EGD) WITH PROPOFOL;  Surgeon: Franky Macho, MD;  Location: [facility];  Service: Endoscopy;  Laterality: N/A;  1:30pm;asa 3   HEMOSTASIS CLIP PLACEMENT  08/06/2023   Procedure: HEMOSTASIS CLIP PLACEMENT;  Surgeon: Franky Macho, MD;  Location: [facility];  Service: Endoscopy;;   LAPAROSCOPIC APPENDECTOMY N/A 11/12/2017   Procedure: APPENDECTOMY LAPAROSCOPIC, REPAIR OF INCARCERATED INCISIONAL HERNIA;  Surgeon: Karie Soda, MD;  Location: [facility];  Service: General;  Laterality: N/A;   LAPAROSCOPIC CHOLECYSTECTOMY     LEFT HEART CATH AND CORONARY ANGIOGRAPHY N/A 07/15/2017   Procedure: LEFT HEART CATH AND CORONARY ANGIOGRAPHY;  Surgeon: Tonny Bollman, MD;  Location: [facility] INVASIVE CV LAB;  Service: Cardiovascular;  Laterality: N/A;   LEFT HEART CATH AND CORONARY ANGIOGRAPHY N/A 03/12/2019   Procedure: LEFT HEART CATH AND CORONARY ANGIOGRAPHY;  Surgeon: Yvonne Kendall, MD;  Location: [facility];  Service: Cardiovascular;  Laterality: N/A;   LEFT HEART CATH AND CORONARY ANGIOGRAPHY N/A 01/16/2023   Procedure: LEFT HEART CATH AND CORONARY ANGIOGRAPHY;  Surgeon: Lennette Bihari, MD;  Location: [facility];  Service: Cardiovascular;  Laterality: N/A;   POLYPECTOMY  08/06/2023   Procedure: POLYPECTOMY INTESTINAL;  Surgeon: Franky Macho, MD;  Location: [facility];  Service: Endoscopy;;   SVT ABLATION N/A 04/22/2021   Procedure: SVT  ABLATION;  Surgeon: Marinus Maw, MD;  Location: [facility];  Service: Cardiovascular;  Laterality: N/A;   Family History Family History  Problem Relation Age of Onset   Heart disease Mother 85       CABG   Diabetes Mother    Gout Mother    Heart attack Father 44    Social History Social History   Tobacco Use   Smoking status: Every Day    Current packs/day: 0.00    Average packs/day: 0.3 packs/day for 26.0 years (6.5 ttl pk-yrs)    Types: Cigarettes    Start date: 03/06/1993    Last attempt to quit: 03/07/2019    Years since quitting: 4.7    Passive exposure: Never   Smokeless tobacco: Never  Vaping Use   Vaping status: Never Used  Substance Use Topics   Alcohol use: No   Drug use: No   Allergies Bee venom and Penicillins  Review of Systems Review of Systems  Cardiovascular:  Positive for chest pain.  Gastrointestinal:  Positive for abdominal pain.    Physical Exam Vital Signs  I have reviewed the triage vital signs BP (!) 151/91 (BP Location: Right Arm)   Pulse 68   Temp 98.3 F (36.8 C) (Oral)   Resp 15   Ht 5\' 11"  (1.803 m)   Wt 127 kg   SpO2 98%   BMI 39.05 kg/m   Physical Exam Constitutional:      General: He is not in acute distress.    Appearance: Normal appearance.  HENT:     Head: Normocephalic and atraumatic.     Nose: No congestion or rhinorrhea.  Eyes:     General:        Right eye: No discharge.        Left eye: No discharge.     Extraocular Movements: Extraocular movements intact.     Pupils: Pupils are equal, round, and reactive to light.  Cardiovascular:     Rate and Rhythm: Normal rate and regular rhythm.     Heart sounds: No murmur heard. Pulmonary:  Effort: No respiratory distress.     Breath sounds: No wheezing or rales.  Abdominal:     General: There is no distension.     Tenderness: There is no abdominal tenderness.  Musculoskeletal:        General: Normal range of motion.     Cervical back: Normal range of  motion.  Skin:    General: Skin is warm and dry.  Neurological:     General: No focal deficit present.     Mental Status: He is alert.     ED Results and Treatments Labs (all labs ordered are listed, but only abnormal results are displayed) Labs Reviewed  CBC - Abnormal; Notable for the following components:      Result Value   Hemoglobin 12.5 (*)    MCH 25.6 (*)    All other components within normal limits  COMPREHENSIVE METABOLIC PANEL - Abnormal; Notable for the following components:   Glucose, Bld 235 (*)    All other components within normal limits  PROTIME-INR  LIPASE, BLOOD  TROPONIN I (HIGH SENSITIVITY)                                                                                                                          Radiology DG Chest 2 View Result Date: 11/19/2023 CLINICAL DATA:  Chest pain and shortness of breath. EXAM: CHEST - 2 VIEW COMPARISON:  Chest radiograph dated 11/14/2023. FINDINGS: Stable cardiomediastinal silhouette. No focal consolidation, pleural effusion, or pneumothorax. No acute osseous abnormality. IMPRESSION: No acute cardiopulmonary findings. Electronically Signed   By: Hart Robinsons M.D.   On: 11/19/2023 09:26    Pertinent labs & imaging results that were available during my care of the patient were reviewed by me and considered in my medical decision making (see MDM for details).  Medications Ordered in ED Medications  alum & mag hydroxide-simeth (MAALOX/MYLANTA) 200-200-20 MG/5ML suspension 30 mL (30 mLs Oral Given 11/19/23 0941)    And  lidocaine (XYLOCAINE) 2 % viscous mouth solution 15 mL (15 mLs Oral Given 11/19/23 0941)                                                                                                                                     Procedures Procedures  (including critical care time)  Medical Decision Making / ED Course   This patient presents to the ED for concern of  chest/epigastric pain, this involves an  extensive number of treatment options, and is a complaint that carries with it a high risk of complications and morbidity.  The differential diagnosis includes GERD/gastritis, peptic ulcer disease, pancreatitis, gastroparesis, pneumonia, pleurisy, pericarditis, ACS  MDM: Patient seen emergency room for evaluation of chest and epigastric pain.  Physical exam is largely unremarkable with a normal cardiopulmonary exam, benign nontender abdomen.  Laboratory evaluation with a glucose of 235, hemoglobin 12.5 but is otherwise unremarkable.  High-sensitivity troponin is normal at 12.  Chest x-ray unremarkable.  ECG nonischemic.  Pressure-like sensation improved with GI cocktail.  Given that patient's symptoms are primarily in the epigastrium and started after eating not shows, I do have a higher suspicion for likely gastritis/GERD and lower suspicion for atypical ACS at this time.  As symptoms have resolved, he currently does not meet inpatient criteria for admission and will be discharged with outpatient follow-up.  He will trial Maalox as needed and monitor for improvement if this does happen again.  Given return precautions of which she voiced understanding and patient discharged with outpatient follow-up   Additional history obtained:  -External records from outside source obtained and reviewed including: Chart review including previous notes, labs, imaging, consultation notes   Lab Tests: -I ordered, reviewed, and interpreted labs.   The pertinent results include:   Labs Reviewed  CBC - Abnormal; Notable for the following components:      Result Value   Hemoglobin 12.5 (*)    MCH 25.6 (*)    All other components within normal limits  COMPREHENSIVE METABOLIC PANEL - Abnormal; Notable for the following components:   Glucose, Bld 235 (*)    All other components within normal limits  PROTIME-INR  LIPASE, BLOOD  TROPONIN I (HIGH SENSITIVITY)      EKG   EKG Interpretation Date/Time:  Thursday  November 19 2023 08:47:47 EST Ventricular Rate:  62 PR Interval:  174 QRS Duration:  96 QT Interval:  388 QTC Calculation: 393 R Axis:   95  Text Interpretation: Normal sinus rhythm Rightward axis When compared with ECG of 14-Nov-2023 11:46, T wave inversion no longer evident in Inferior leads Confirmed by Cleve Paolillo ([id number]) on 11/19/2023 9:33:33 AM         Imaging Studies ordered: I ordered imaging studies including chest x-ray I independently visualized and interpreted imaging. I agree with the radiologist interpretation   Medicines ordered and prescription drug management: Meds ordered this encounter  Medications   AND Linked Order Group    alum & mag hydroxide-simeth (MAALOX/MYLANTA) 200-200-20 MG/5ML suspension 30 mL    lidocaine (XYLOCAINE) 2 % viscous mouth solution 15 mL   alum & mag hydroxide-simeth (MAALOX MAX) 400-400-40 MG/5ML suspension    Sig: Take 15 mLs by mouth every 6 (six) hours as needed for indigestion.    Dispense:  355 mL    Refill:  0    -I have reviewed the patients home medicines and have made adjustments as needed  Critical interventions none    Cardiac Monitoring: The patient was maintained on a cardiac monitor.  I personally viewed and interpreted the cardiac monitored which showed an underlying rhythm of: NSR  Social Determinants of Health:  Factors impacting patients care include: none   Reevaluation: After the interventions noted above, I reevaluated the patient and found that they have :improved  Co morbidities that complicate the patient evaluation  Past Medical History:  Diagnosis Date   CAD (coronary artery disease), native coronary artery  10/18 PCI/DES to mRCA, and OM, with total occlusion of dLCx with collaterals.    Essential hypertension    GERD (gastroesophageal reflux disease)    History of kidney stones    Hyperlipemia    MI (myocardial infarction) (HCC) 2020   SVT (supraventricular tachycardia) (HCC) 2015    Converted with adenosine   Type 2 diabetes mellitus (HCC)       Dispostion: I considered admission for this patient, but at this time he does not meet inpatient criteria for admission and will be discharged with outpatient follow-up     Final Clinical Impression(s) / ED Diagnoses Final diagnoses:  Atypical chest pain  Epigastric pain     @PCDICTATION @    Yahshua Thibault, Wyn Forster, MD 11/19/23 1452

## 2023-11-19 NOTE — ED Provider Triage Note (Signed)
Emergency Medicine Provider Triage Evaluation Note  Lance [name] , a 48 y.o. male  was evaluated in triage.  Pt complains of chest pain that began at 3 PM yesterday after eating nachos with cheese.  Patient states that the chest pain was on the right lower side of his chest radiates to substernal into his epigastrium.  Patient does have history of GERD and thinks this may be the cause of it.  Patient initially had shortness of breath with it however this is since resolved.  Patient denies any leg swelling, hemoptysis.  Patient no longer has his gallbladder.  Review of Systems  Positive:  Negative:   Physical Exam  BP (!) 151/91 (BP Location: Right Arm)   Pulse 70   Temp 98.3 F (36.8 C) (Oral)   Resp 18   Ht 5\' 11"  (1.803 m)   Wt 127 kg   SpO2 99%   BMI 39.05 kg/m  Gen:   Awake, no distress   Resp:  Normal effort  MSK:   Moves extremities without difficulty  Other:  Abdomen nontender without peritoneal signs  Medical Decision Making  Medically screening exam initiated at 9:00 AM.  Appropriate orders placed.  Lance [name] was informed that the remainder of the evaluation will be completed by another provider, this initial triage assessment does not replace that evaluation, and the importance of remaining in the ED until their evaluation is complete.  Workup initiated, labs and imaging ordered, do feel this could be GI related and will give GI cocktail as this occurred after eating nachos with cheese.  Patient stable at this time.   Netta Corrigan, PA-C 11/19/23 [phone]

## 2023-12-08 ENCOUNTER — Encounter: Payer: Self-pay | Admitting: "Endocrinology

## 2023-12-08 ENCOUNTER — Ambulatory Visit: Payer: 59 | Admitting: "Endocrinology

## 2023-12-08 VITALS — BP 124/66 | HR 76 | Ht 71.0 in | Wt 292.0 lb

## 2023-12-08 DIAGNOSIS — Z794 Long term (current) use of insulin: Secondary | ICD-10-CM | POA: Diagnosis not present

## 2023-12-08 DIAGNOSIS — I10 Essential (primary) hypertension: Secondary | ICD-10-CM

## 2023-12-08 DIAGNOSIS — E782 Mixed hyperlipidemia: Secondary | ICD-10-CM | POA: Diagnosis not present

## 2023-12-08 DIAGNOSIS — E1159 Type 2 diabetes mellitus with other circulatory complications: Secondary | ICD-10-CM

## 2023-12-08 LAB — POCT GLYCOSYLATED HEMOGLOBIN (HGB A1C): HbA1c, POC (controlled diabetic range): 8.3 % — AB (ref 0.0–7.0)

## 2023-12-08 NOTE — Progress Notes (Signed)
 12/08/2023  Endocrinology follow-up note  Subjective:    Patient ID: Lance [name], male   DOB: November 06, 1975.  Lance [name] is being seen in follow-up for management of currently uncontrolled type 2 diabetes, complicated by coronary artery disease recently which required stent placement, hyperlipidemia, hypertension.   Past Medical History:  Diagnosis Date   CAD (coronary artery disease), native coronary artery    10/18 PCI/DES to mRCA, and OM, with total occlusion of dLCx with collaterals.    Essential hypertension    GERD (gastroesophageal reflux disease)    History of kidney stones    Hyperlipemia    MI (myocardial infarction) (HCC) 2020   SVT (supraventricular tachycardia) (HCC) 2015   Converted with adenosine   Type 2 diabetes mellitus (HCC)    Past Surgical History:  Procedure Laterality Date   BIOPSY  08/06/2023   Procedure: BIOPSY;  Surgeon: Lance Macho, MD;  Location: [facility];  Service: Endoscopy;;   CARDIAC SURGERY     COLONOSCOPY WITH PROPOFOL N/A 08/06/2023   Procedure: COLONOSCOPY WITH PROPOFOL;  Surgeon: Lance Macho, MD;  Location: [facility];  Service: Endoscopy;  Laterality: N/A;   CORONARY ANGIOPLASTY WITH STENT PLACEMENT  07/15/2017   "2 stents"   CORONARY BALLOON ANGIOPLASTY N/A 01/16/2023   Procedure: CORONARY BALLOON ANGIOPLASTY;  Surgeon: Lennette Bihari, MD;  Location: [facility];  Service: Cardiovascular;  Laterality: N/A;   CORONARY/GRAFT ACUTE MI REVASCULARIZATION N/A 03/12/2019   Procedure: Coronary/Graft Acute MI Revascularization;  Surgeon: Yvonne Kendall, MD;  Location: [facility];  Service: Cardiovascular;  Laterality: N/A;   ESOPHAGOGASTRODUODENOSCOPY (EGD) WITH PROPOFOL N/A 08/06/2023   Procedure: ESOPHAGOGASTRODUODENOSCOPY (EGD) WITH PROPOFOL;  Surgeon: Lance Macho, MD;  Location: [facility];  Service: Endoscopy;  Laterality: N/A;  1:30pm;asa 3   HEMOSTASIS CLIP PLACEMENT  08/06/2023   Procedure: HEMOSTASIS  CLIP PLACEMENT;  Surgeon: Lance Macho, MD;  Location: [facility];  Service: Endoscopy;;   LAPAROSCOPIC APPENDECTOMY N/A 11/12/2017   Procedure: APPENDECTOMY LAPAROSCOPIC, REPAIR OF INCARCERATED INCISIONAL HERNIA;  Surgeon: Karie Soda, MD;  Location: [facility];  Service: General;  Laterality: N/A;   LAPAROSCOPIC CHOLECYSTECTOMY     LEFT HEART CATH AND CORONARY ANGIOGRAPHY N/A 07/15/2017   Procedure: LEFT HEART CATH AND CORONARY ANGIOGRAPHY;  Surgeon: Tonny Bollman, MD;  Location: [facility];  Service: Cardiovascular;  Laterality: N/A;   LEFT HEART CATH AND CORONARY ANGIOGRAPHY N/A 03/12/2019   Procedure: LEFT HEART CATH AND CORONARY ANGIOGRAPHY;  Surgeon: Yvonne Kendall, MD;  Location: [facility];  Service: Cardiovascular;  Laterality: N/A;   LEFT HEART CATH AND CORONARY ANGIOGRAPHY N/A 01/16/2023   Procedure: LEFT HEART CATH AND CORONARY ANGIOGRAPHY;  Surgeon: Lennette Bihari, MD;  Location: [facility];  Service: Cardiovascular;  Laterality: N/A;   POLYPECTOMY  08/06/2023   Procedure: POLYPECTOMY INTESTINAL;  Surgeon: Lance Macho, MD;  Location: [facility];  Service: Endoscopy;;   SVT ABLATION N/A 04/22/2021   Procedure: SVT ABLATION;  Surgeon: Marinus Maw, MD;  Location: [facility];  Service: Cardiovascular;  Laterality: N/A;   Social History   Socioeconomic History   Marital status: Married    Spouse name: Not on file   Number of children: Not on file   Years of education: Not on file   Highest education level: Not on file  Occupational History   Occupation: Superintendent    Employer: [employer]  Tobacco Use  Smoking status: Every Day    Current packs/day: 0.00    Average packs/day: 0.3 packs/day for 26.0 years (6.5 ttl pk-yrs)    Types: Cigarettes    Start date: 03/06/1993    Last attempt to quit: 03/07/2019    Years since quitting: 4.7    Passive exposure: Never   Smokeless tobacco: Never  Vaping Use   Vaping status:  Never Used  Substance and Sexual Activity   Alcohol use: No   Drug use: No   Sexual activity: Yes    Birth control/protection: None  Other Topics Concern   Not on file  Social History Narrative   Not on file   Social Drivers of Health   Financial Resource Strain: Not on file  Food Insecurity: No Food Insecurity (02/07/2023)   Hunger Vital Sign    Worried About Running Out of Food in the Last Year: Never true    Ran Out of Food in the Last Year: Never true  Transportation Needs: No Transportation Needs (02/07/2023)   PRAPARE - Administrator, Civil Service (Medical): No    Lack of Transportation (Non-Medical): No  Physical Activity: Not on file  Stress: Not on file  Social Connections: Not on file   Outpatient Encounter Medications as of 12/08/2023  Medication Sig   OZEMPIC, 0.25 OR 0.5 MG/DOSE, 2 MG/3ML SOPN Inject 1 mg into the skin once a week.   ALPRAZolam (XANAX) 1 MG tablet Take 1 mg by mouth 2 (two) times daily.   alum & mag hydroxide-simeth (MAALOX MAX) 400-400-40 MG/5ML suspension Take 15 mLs by mouth every 6 (six) hours as needed for indigestion.   aspirin EC 81 MG tablet Take 1 tablet (81 mg total) by mouth daily with breakfast.   atorvastatin (LIPITOR) 80 MG tablet TAKE 1 TABLET BY MOUTH EVERYDAY AT BEDTIME   B-D ULTRAFINE III SHORT PEN 31G X 8 MM MISC USE TO INJECT INSULIN 4 (FOUR) TIMES DAILY.   blood glucose meter kit and supplies KIT 1 each by Does not apply route 4 (four) times daily. Dispense based on patient and insurance preference. Use up to four times daily as directed. (FOR ICD-9 250.00, 250.01).   BRILINTA 90 MG TABS tablet TAKE 1 TABLET BY MOUTH 2 TIMES DAILY.   Continuous Glucose Sensor (DEXCOM G7 SENSOR) MISC CHANGE SENSOR EVERY 10 DAYS   famotidine (PEPCID) 40 MG tablet Take 40 mg by mouth daily.   ferrous sulfate 325 (65 FE) MG tablet Take 1 tablet (325 mg total) by mouth every other day.   insulin aspart (NOVOLOG FLEXPEN) 100 UNIT/ML FlexPen  INJECT 20-26 UNITS INTO THE SKIN 3 (THREE) TIMES DAILY WITH MEALS.   insulin glargine (LANTUS SOLOSTAR) 100 UNIT/ML Solostar Pen Inject 80 Units into the skin at bedtime.   lisinopril (ZESTRIL) 40 MG tablet TAKE 1 TABLET BY MOUTH EVERY DAY   nitroGLYCERIN (NITROSTAT) 0.4 MG SL tablet Place 1 tablet (0.4 mg total) under the tongue every 5 (five) minutes x 3 doses as needed for chest pain.   omeprazole (PRILOSEC) 40 MG capsule Take 40 mg by mouth daily.   ondansetron (ZOFRAN-ODT) 4 MG disintegrating tablet Take 1 tablet (4 mg total) by mouth every 8 (eight) hours as needed.   Sodium Sulfate-Mag Sulfate-KCl (SUTAB) [phone] MG TABS As directed   No facility-administered encounter medications on file as of 12/08/2023.   ALLERGIES: Allergies  Allergen Reactions   Bee Venom Anaphylaxis and Swelling   Penicillins Hives, Itching and Rash  VACCINATION STATUS:  There is no immunization history on file for this patient.  Diabetes He presents for his follow-up diabetic visit. He has type 2 diabetes mellitus. Onset time: He was diagnosed at approximate age of 35 years. His disease course has been stable. There are no hypoglycemic associated symptoms. Pertinent negatives for hypoglycemia include no confusion, headaches, pallor or seizures. Pertinent negatives for diabetes include no blurred vision, no chest pain, no fatigue, no polydipsia, no polyphagia, no polyuria and no weakness. There are no hypoglycemic complications. Symptoms are worsening. Diabetic complications include heart disease. Risk factors for coronary artery disease include diabetes mellitus, dyslipidemia, family history, hypertension, male sex, obesity, tobacco exposure and sedentary lifestyle. Current diabetic treatment includes intensive insulin program. His weight is increasing steadily. He is following a generally unhealthy diet. When asked about meal planning, he reported none. He has not had a previous visit with a dietitian. He  rarely participates in exercise. His home blood glucose trend is fluctuating minimally. His breakfast blood glucose range is generally 140-180 mg/dl. His lunch blood glucose range is generally 140-180 mg/dl. His dinner blood glucose range is generally 140-180 mg/dl. His bedtime blood glucose range is generally 140-180 mg/dl. His overall blood glucose range is 140-180 mg/dl. Jill Alexanders presents with his CGM device showing average blood glucose of 176 mg per DL for the most recent 2 weeks.  His CGM overview shows 54% time in range, 40% level 1 hyperglycemia, 4% level 2 hyperglycemia.  He does not have significant hypoglycemia.  His point-of-care A1c is 8.3% increasing from 7.1%.   He did not document any hypoglycemia. ) An ACE inhibitor/angiotensin II receptor blocker is being taken. He does not see a podiatrist.Eye exam is not current.  Hyperlipidemia This is a chronic problem. The current episode started more than 1 year ago. The problem is controlled. Exacerbating diseases include diabetes and obesity. Pertinent negatives include no chest pain, myalgias or shortness of breath. Current antihyperlipidemic treatment includes statins and bile acid squestrants. The current treatment provides significant improvement of lipids. Risk factors for coronary artery disease include diabetes mellitus, dyslipidemia, hypertension, male sex, obesity and a sedentary lifestyle.  Hypertension This is a chronic problem. The current episode started more than 1 year ago. Pertinent negatives include no blurred vision, chest pain, headaches, neck pain, palpitations or shortness of breath. Risk factors for coronary artery disease include dyslipidemia, diabetes mellitus, male gender, smoking/tobacco exposure and sedentary lifestyle. Past treatments include ACE inhibitors. Hypertensive end-organ damage includes CAD/MI.   Review of systems  Constitutional: + Minimally fluctuating body weight,  current  Body mass index is 40.73 kg/m. ,  no fatigue, no subjective hyperthermia, no subjective hypothermia    Objective:    BP 124/66   Pulse 76   Ht 5\' 11"  (1.803 m)   Wt 292 lb (132.5 kg)   BMI 40.73 kg/m   Wt Readings from Last 3 Encounters:  12/08/23 292 lb (132.5 kg)  11/19/23 280 lb (127 kg)  11/14/23 290 lb (131.5 kg)      Physical Exam- Limited  Constitutional:  Body mass index is 40.73 kg/m. , not in acute distress  CMP ( most recent) CMP     Component Value Date/Time   NA 135 11/19/2023 0903   NA 139 09/22/2022 0756   K 4.4 11/19/2023 0903   CL 102 11/19/2023 0903   CO2 26 11/19/2023 0903   GLUCOSE 235 (H) 11/19/2023 0903   BUN 14 11/19/2023 0903   BUN 16 09/22/2022 0756  CREATININE 0.98 11/19/2023 0903   CREATININE 0.91 04/30/2020 0728   CALCIUM 9.1 11/19/2023 0903   PROT 7.0 11/19/2023 0903   PROT 6.4 09/22/2022 0756   ALBUMIN 3.9 11/19/2023 0903   ALBUMIN 4.1 09/22/2022 0756   AST 23 11/19/2023 0903   ALT 32 11/19/2023 0903   ALKPHOS 97 11/19/2023 0903   BILITOT 0.7 11/19/2023 0903   BILITOT 0.6 09/22/2022 0756   GFRNONAA >60 11/19/2023 0903   GFRNONAA 102 04/30/2020 0728   GFRAA 118 04/30/2020 0728     Diabetic Labs (most recent): Lab Results  Component Value Date   HGBA1C 8.3 (A) 12/08/2023   HGBA1C 7.1 (H) 01/16/2023   HGBA1C 7.2 (A) 10/29/2022   MICROALBUR 1.0 04/30/2020   MICROALBUR 0.4 10/11/2019   MICROALBUR 0.8 04/19/2018    Lipid Panel     Component Value Date/Time   CHOL 111 09/22/2022 0756   TRIG 197 (H) 09/22/2022 0756   HDL 19 (L) 09/22/2022 0756   CHOLHDL 5.8 (H) 09/22/2022 0756   CHOLHDL 5.5 (H) 10/11/2019 0856   VLDL 47 (H) 03/13/2019 0547   LDLCALC 59 09/22/2022 0756   LDLCALC 63 10/11/2019 0856     Assessment & Plan:   1. Uncontrolled type 2 diabetes mellitus with chronic kidney disease, coronary artery disease, with long-term current use of insulin.   Kimble presents with his CGM device showing average blood glucose of 176 mg per DL for the  most recent 2 weeks.  His CGM overview shows 54% time in range, 40% level 1 hyperglycemia, 4% level 2 hyperglycemia.  He does not have significant hypoglycemia.  His point-of-care A1c is 8.3% increasing from 7.1%.   He did not document any hypoglycemia.   Recent labs reviewed. -his diabetes is complicated by obesity/sedentary life, coronary artery disease which recently required coronary catheterization, chronic heavy smoking and CAEDIN MOGAN remains at a high risk for more acute and chronic complications which include CAD, CVA, CKD, retinopathy, and neuropathy. These are all discussed in detail with the patient.  - I have counseled him on diet management and weight loss, by adopting a carbohydrate restricted/protein rich diet. -He is partially disengaged from lifestyle medicine.  He will still benefit from lifestyle medicine.   - he acknowledges that there is a room for improvement in his food and drink choices. - Suggestion is made for him to avoid simple carbohydrates  from his diet including Cakes, Sweet Desserts, Ice Cream, Soda (diet and regular), Sweet Tea, Candies, Chips, Cookies, Store Bought Juices, Alcohol , Artificial Sweeteners,  Coffee Creamer, and "Sugar-free" Products, Lemonade. This will help patient to have more stable blood glucose profile and potentially avoid unintended weight gain.  The following Lifestyle Medicine recommendations according to American College of Lifestyle Medicine  [facility]) were discussed and and offered to patient and he  agrees to start the journey:  A. Whole Foods, Plant-Based Nutrition comprising of fruits and vegetables, plant-based proteins, whole-grain carbohydrates was discussed in detail with the patient.   A list for source of those nutrients were also provided to the patient.  Patient will use only water or unsweetened tea for hydration. B.  The need to stay away from risky substances including alcohol, smoking; obtaining 7 to 9 hours of restorative  sleep, at least 150 minutes of moderate intensity exercise weekly, the importance of healthy social connections,  and stress management techniques were discussed. C.  A full color page of  Calorie density of various food groups per pound showing  examples of each food groups was provided to the patient.    - he is advised to stick to a routine mealtimes to eat 3 meals  a day and avoid unnecessary snacks ( to snack only to correct hypoglycemia).   - I have approached him with the following individualized plan to manage diabetes and patient agrees:   -Based on his presentation with still above target glycemic profile despite high-dose basal/bolus insulin, he needed additional intervention with SGLT2 inhibitors and he was approached with a prescription for Jardiance, however patient declined.  -He is advised to increase Lantus to 80 units nightly, continue NovoLog at  20-26  units 3 times daily before meals, associated with strict monitoring of blood glucose 4 times a day-before meals and at bedtime . -He is advised to use his CGM at all times, especially monitoring before meals and at bedtime.   -He did not tolerate higher dose of Ozempic which caused abdominal pain.  He is cleared by his cardiologist to resume his Ozempic.  He is advised to continue Ozempic 1 mg subcutaneously weekly.     -Patient is encouraged to call clinic for blood glucose levels less than 70 or above 200 mg /dl.  -He does not want to continue metformin.  - Patient specific target  A1c;  LDL, HDL, Triglycerides,  were discussed in detail.  2) BP/HTN: -His blood pressure is controlled to target.   He is advised to continue  with his current blood pressure medications including lisinopril 40 mg p.o. daily along with his carvedilol 3.125 mg p.o. twice daily.    3) Lipids: His recent lipid panel showed improved and controlled LDL at 59.  He is advised to continue Lipitor 80 mg p.o. nightly.   He is also on omega-3 fatty acids  1000 mg p.o. twice daily.  Whole food plant-based diet will help with dyslipidemia.      4)  Weight/Diet: His BMI is 40.73----clearly complicating his diabetes care.  He is a candidate for major weight loss.  CDE Consult has been initiated  , exercise, and detailed carbohydrates information provided.  He is an ideal candidate for bariatric surgery.    5) Chronic Care/Health Maintenance:  -he  is on ACEI/ARB and Statin medications and  is encouraged to continue to follow up with Ophthalmology, Dentist,  Podiatrist at least yearly or according to recommendations, and advised to  quit smoking. I have recommended yearly flu vaccine and pneumonia vaccination at least every 5 years; moderate intensity exercise for up to 150 minutes weekly; and  sleep for at least 7 hours a day. The patient was counseled on the dangers of tobacco use, and was advised to quit.  Reviewed strategies to maximize success, including removing cigarettes and smoking materials from environment.  - I advised patient to maintain close follow up with Lianne Moris, PA-C for primary care needs.  I spent  45  minutes in the care of the patient today including review of labs from CMP, Lipids, Thyroid Function, Hematology (current and previous including abstractions from other facilities); face-to-face time discussing  his blood glucose readings/logs, discussing hypoglycemia and hyperglycemia episodes and symptoms, medications doses, his options of short and long term treatment based on the latest standards of care / guidelines;  discussion about incorporating lifestyle medicine;  and documenting the encounter. Risk reduction counseling performed per USPSTF guidelines to reduce  obesity and cardiovascular risk factors.     Please refer to Patient Instructions for Blood Glucose Monitoring and Insulin/Medications  Dosing Guide"  in media tab for additional information. Please  also refer to " Patient Self Inventory" in the Media  tab for  reviewed elements of pertinent patient history.  [name] participated in the discussions, expressed understanding, and voiced agreement with the above plans.  All questions were answered to his satisfaction. he is encouraged to contact clinic should he have any questions or concerns prior to his return visit.   Follow up plan: - Return in about 4 months (around 04/08/2024) for Bring Meter/CGM Device/Logs- A1c in Office.  Marquis Lunch, MD Phone: [phone]  Fax: [phone]  -  This note was partially dictated with voice recognition software. Similar sounding words can be transcribed inadequately or may not  be corrected upon review.  12/08/2023, 4:40 PM

## 2023-12-08 NOTE — Patient Instructions (Signed)

## 2023-12-10 ENCOUNTER — Ambulatory Visit: Payer: 59 | Admitting: "Endocrinology

## 2023-12-18 ENCOUNTER — Encounter (HOSPITAL_COMMUNITY): Payer: Self-pay

## 2023-12-18 ENCOUNTER — Other Ambulatory Visit: Payer: Self-pay

## 2023-12-18 ENCOUNTER — Emergency Department (HOSPITAL_COMMUNITY)

## 2023-12-18 ENCOUNTER — Emergency Department (HOSPITAL_COMMUNITY)
Admission: EM | Admit: 2023-12-18 | Discharge: 2023-12-18 | Disposition: A | Discharge status: Home / Self Care | Attending: Emergency Medicine | Admitting: Emergency Medicine

## 2023-12-18 DIAGNOSIS — R0602 Shortness of breath: Secondary | ICD-10-CM | POA: Insufficient documentation

## 2023-12-18 DIAGNOSIS — F17200 Nicotine dependence, unspecified, uncomplicated: Secondary | ICD-10-CM | POA: Diagnosis not present

## 2023-12-18 DIAGNOSIS — Z794 Long term (current) use of insulin: Secondary | ICD-10-CM | POA: Diagnosis not present

## 2023-12-18 DIAGNOSIS — Z7982 Long term (current) use of aspirin: Secondary | ICD-10-CM | POA: Diagnosis not present

## 2023-12-18 DIAGNOSIS — E119 Type 2 diabetes mellitus without complications: Secondary | ICD-10-CM | POA: Insufficient documentation

## 2023-12-18 DIAGNOSIS — R42 Dizziness and giddiness: Secondary | ICD-10-CM

## 2023-12-18 LAB — CBC
HCT: 37.7 % — ABNORMAL LOW (ref 39.0–52.0)
Hemoglobin: 11.6 g/dL — ABNORMAL LOW (ref 13.0–17.0)
MCH: 24.8 pg — ABNORMAL LOW (ref 26.0–34.0)
MCHC: 30.8 g/dL (ref 30.0–36.0)
MCV: 80.7 fL (ref 80.0–100.0)
Platelets: 237 10*3/uL (ref 150–400)
RBC: 4.67 MIL/uL (ref 4.22–5.81)
RDW: 14 % (ref 11.5–15.5)
WBC: 9.8 10*3/uL (ref 4.0–10.5)
nRBC: 0 % (ref 0.0–0.2)

## 2023-12-18 LAB — BASIC METABOLIC PANEL
Anion gap: 8 (ref 5–15)
BUN: 9 mg/dL (ref 6–20)
CO2: 26 mmol/L (ref 22–32)
Calcium: 8.9 mg/dL (ref 8.9–10.3)
Chloride: 105 mmol/L (ref 98–111)
Creatinine, Ser: 1.13 mg/dL (ref 0.61–1.24)
GFR, Estimated: >60 mL/min (ref >60)
Glucose, Bld: 169 mg/dL — ABNORMAL HIGH (ref 70–99)
Potassium: 3.7 mmol/L (ref 3.5–5.1)
Sodium: 139 mmol/L (ref 135–145)

## 2023-12-18 LAB — TROPONIN I (HIGH SENSITIVITY)
Troponin I (High Sensitivity): 16 ng/L (ref <18)
Troponin I (High Sensitivity): 25 ng/L — ABNORMAL HIGH (ref <18)

## 2023-12-18 NOTE — Discharge Instructions (Addendum)
 I recommend that you follow up with your cardiologist (heart doctor) for the episode that you had today.  Your workup was reassuring in the ER, but that does not exclude the possibility of heart disease, heart rhythm issues, and other serious medical conditions.  Please drink plenty of water to stay hydrated.  If you develop chest pressure, lightheadedness, loss of consciousness, symptoms that feel like your old heart attack, or any other concerning symptoms, call 911 or return to the emergency department.

## 2023-12-18 NOTE — ED Provider Notes (Signed)
  EMERGENCY DEPARTMENT AT [facility] Provider Note   CSN: [id number] Arrival date & time: 12/18/23  1124     History  Chief Complaint  Patient presents with   Dizziness    Lance [name] is a 48 y.o. male presented to ED with dizziness.  Patient reports that he woke up feeling fine.  He drove to work when he got out of the truck he was abruptly feeling lightheaded and broke into a cold sweat.  The swelling lasted about a minute and the lightheadedness gradually improved over a few minutes back to normal.  He is currently asymptomatic.  He denies any chest pain.  He  reports he had some transient nausea and shortness of breath but no longer.  He does report a significant coronary and cardiac history of multiple stents.  He also reports history of nicotine use, high blood pressure, obesity, diabetes.  HPI     Home Medications Prior to Admission medications   Medication Sig Start Date End Date Taking? Authorizing Provider  ALPRAZolam Prudy Feeler) 1 MG tablet Take 1 mg by mouth 2 (two) times daily. 11/05/23   [provider]  alum & mag hydroxide-simeth (MAALOX MAX) 400-400-40 MG/5ML suspension Take 15 mLs by mouth every 6 (six) hours as needed for indigestion. 11/19/23   Kommor, Wyn Forster, MD  aspirin EC 81 MG tablet Take 1 tablet (81 mg total) by mouth daily with breakfast. 04/05/19   Mariea Clonts, Courage, MD  atorvastatin (LIPITOR) 80 MG tablet TAKE 1 TABLET BY MOUTH EVERYDAY AT BEDTIME 07/21/23   Jonelle Sidle, MD  B-D ULTRAFINE III SHORT PEN 31G X 8 MM MISC USE TO INJECT INSULIN 4 (FOUR) TIMES DAILY. 10/01/23   Roma Kayser, MD  blood glucose meter kit and supplies KIT 1 each by Does not apply route 4 (four) times daily. Dispense based on patient and insurance preference. Use up to four times daily as directed. (FOR ICD-9 250.00, 250.01). 02/14/20   Nida, Denman George, MD  BRILINTA 90 MG TABS tablet TAKE 1 TABLET BY MOUTH 2 TIMES DAILY. 10/30/23   Sharlene Dory, NP  Continuous Glucose Sensor (DEXCOM G7 SENSOR) MISC CHANGE SENSOR EVERY 10 DAYS 09/28/23   Roma Kayser, MD  famotidine (PEPCID) 40 MG tablet Take 40 mg by mouth daily. 04/30/23   [provider]  ferrous sulfate 325 (65 FE) MG tablet Take 1 tablet (325 mg total) by mouth every other day. 08/06/23 11/04/23  Ahmed, Juanetta Beets, MD  insulin aspart (NOVOLOG FLEXPEN) 100 UNIT/ML FlexPen INJECT 20-26 UNITS INTO THE SKIN 3 (THREE) TIMES DAILY WITH MEALS. 11/02/23   Nida, Denman George, MD  insulin glargine (LANTUS SOLOSTAR) 100 UNIT/ML Solostar Pen Inject 80 Units into the skin at bedtime. 11/02/23   Roma Kayser, MD  lisinopril (ZESTRIL) 40 MG tablet TAKE 1 TABLET BY MOUTH EVERY DAY 10/30/23   Jonelle Sidle, MD  nitroGLYCERIN (NITROSTAT) 0.4 MG SL tablet Place 1 tablet (0.4 mg total) under the tongue every 5 (five) minutes x 3 doses as needed for chest pain. 09/02/21   Jonelle Sidle, MD  omeprazole (PRILOSEC) 40 MG capsule Take 40 mg by mouth daily. 05/18/23   [provider]  ondansetron (ZOFRAN-ODT) 4 MG disintegrating tablet Take 1 tablet (4 mg total) by mouth every 8 (eight) hours as needed. 11/14/23   Loetta Rough, MD  OZEMPIC, 0.25 OR 0.5 MG/DOSE, 2 MG/3ML SOPN Inject 1 mg into the skin once a week. 11/08/23  [provider]  Sodium Sulfate-Mag Sulfate-KCl (SUTAB) [phone] MG TABS As directed 07/30/23   Franky Macho, MD      Allergies    Bee venom and Penicillins    Review of Systems   Review of Systems  Physical Exam Updated Vital Signs BP 128/64   Pulse 71   Temp 97.9 F (36.6 C)   Resp (!) 22   Ht 5\' 11"  (1.803 m)   Wt 129.3 kg   SpO2 100%   BMI 39.75 kg/m  Physical Exam Constitutional:      General: He is not in acute distress. HENT:     Head: Normocephalic and atraumatic.  Eyes:     Conjunctiva/sclera: Conjunctivae normal.     Pupils: Pupils are equal, round, and reactive to light.  Cardiovascular:      Rate and Rhythm: Normal rate and regular rhythm.  Pulmonary:     Effort: Pulmonary effort is normal. No respiratory distress.  Abdominal:     General: There is no distension.     Tenderness: There is no abdominal tenderness.  Skin:    General: Skin is warm and dry.  Neurological:     General: No focal deficit present.     Mental Status: He is alert. Mental status is at baseline.  Psychiatric:        Mood and Affect: Mood normal.        Behavior: Behavior normal.     ED Results / Procedures / Treatments   Labs (all labs ordered are listed, but only abnormal results are displayed) Labs Reviewed  BASIC METABOLIC PANEL - Abnormal; Notable for the following components:      Result Value   Glucose, Bld 169 (*)    All other components within normal limits  CBC - Abnormal; Notable for the following components:   Hemoglobin 11.6 (*)    HCT 37.7 (*)    MCH 24.8 (*)    All other components within normal limits  TROPONIN I (HIGH SENSITIVITY) - Abnormal; Notable for the following components:   Troponin I (High Sensitivity) 25 (*)    All other components within normal limits  TROPONIN I (HIGH SENSITIVITY)    EKG EKG Interpretation Date/Time:  Friday December 18 2023 11:29:38 EDT Ventricular Rate:  72 PR Interval:  196 QRS Duration:  96 QT Interval:  386 QTC Calculation: 422 R Axis:   110  Text Interpretation: Normal sinus rhythm with sinus arrhythmia When compared with ECG of 19-Nov-2023 08:47, PREVIOUS ECG IS PRESENT Confirmed by Alvester Chou [id number]) on 12/18/2023 12:02:27 PM  Radiology DG Chest 2 View Result Date: 12/18/2023 CLINICAL DATA:  Chest pain EXAM: CHEST - 2 VIEW COMPARISON:  Chest radiograph 11/19/2023 FINDINGS: Enlarged cardiac and mediastinal contours. Pulmonary vascular redistribution. No large area pulmonary consolidation. No pleural effusion or pneumothorax. IMPRESSION: No active cardiopulmonary disease. Electronically Signed   By: Annia Belt M.D.   On:  12/18/2023 13:32    Procedures Procedures    Medications Ordered in ED Medications - No data to display  ED Course/ Medical Decision Making/ A&P                                 Medical Decision Making Amount and/or Complexity of Data Reviewed Labs: ordered. Radiology: ordered.   This patient presents to the ED with concern for transient lightheadedness and shortness of breath. This involves an extensive number of treatment options, and  is a complaint that carries with it a high risk of complications and morbidity.  The differential diagnosis includes arrhythmia versus orthostatic hypotension versus ACS versus other  Co-morbidities that complicate the patient evaluation: Cardiovascular risk factors as noted above  I ordered and personally interpreted labs.  The pertinent results include: No emergent finding.  There was a very small but is significant increase in troponin level, not consistent with ACS, which may have been some mild demand ischemia or related to a tachycardia now resolved.  I ordered imaging studies including x-ray of the chest I independently visualized and interpreted imaging which showed no emergent finding I agree with the radiologist interpretation  The patient was maintained on a cardiac monitor.  I personally viewed and interpreted the cardiac monitored which showed an underlying rhythm of: Sinus rhythm  Per my interpretation the patient's ECG shows sinus rhythm no acute ischemic findings  I have reviewed the patients home medicines and have made adjustments as needed  Test Considered: Lower suspicion for acute PE with no risk factors, no tachycardia or hypoxia in the ED   After the interventions noted above, I reevaluated the patient and found that they have: improved  Social Determinants of Health: smoking cessation  Dispostion:  Hospitalization was considered.  However, given that the patient is remained asymptomatic with a normal sinus rhythm on  telemetry, no chest pressure or lightheadedness, and a reassuring EKG, with no significant increase in troponin levels, I do think outpatient follow-up would be reasonable with his history.  I did encourage the patient to follow-up with cardiologist in his discharge instructions.  He is stable for discharge  After consideration of the diagnostic results and the patients response to treatment, I feel that the patent would benefit from close outpatient follow-up         Final Clinical Impression(s) / ED Diagnoses Final diagnoses:  Dizziness  Lightheadedness    Rx / DC Orders ED Discharge Orders     None         Terald Sleeper, MD 12/18/23 1551

## 2023-12-18 NOTE — ED Triage Notes (Signed)
 Pt c/o dizziness episode. Pt states he felt dizzy and broke out in a cold sweat. Pt has had ongoing indigestion and upper abdominal/chest pain. Pt has had nausea and shortness of breath, denies vomiting.

## 2023-12-18 NOTE — Discharge Planning (Signed)
 RNCM notes multiple admissions.  TOC will follow for discharge planning needs.

## 2023-12-29 ENCOUNTER — Emergency Department (HOSPITAL_COMMUNITY)

## 2023-12-29 ENCOUNTER — Other Ambulatory Visit: Payer: Self-pay

## 2023-12-29 ENCOUNTER — Encounter (HOSPITAL_COMMUNITY): Payer: Self-pay

## 2023-12-29 ENCOUNTER — Emergency Department (HOSPITAL_COMMUNITY)
Admission: EM | Admit: 2023-12-29 | Discharge: 2023-12-29 | Disposition: A | Discharge status: Home / Self Care | Attending: Emergency Medicine | Admitting: Emergency Medicine

## 2023-12-29 DIAGNOSIS — Z7982 Long term (current) use of aspirin: Secondary | ICD-10-CM | POA: Insufficient documentation

## 2023-12-29 DIAGNOSIS — Z955 Presence of coronary angioplasty implant and graft: Secondary | ICD-10-CM | POA: Insufficient documentation

## 2023-12-29 DIAGNOSIS — I2510 Atherosclerotic heart disease of native coronary artery without angina pectoris: Secondary | ICD-10-CM | POA: Diagnosis not present

## 2023-12-29 DIAGNOSIS — R0789 Other chest pain: Secondary | ICD-10-CM | POA: Insufficient documentation

## 2023-12-29 LAB — TROPONIN I (HIGH SENSITIVITY)
Troponin I (High Sensitivity): 14 ng/L (ref <18)
Troponin I (High Sensitivity): 14 ng/L (ref <18)

## 2023-12-29 LAB — CBC
HCT: 32.9 % — ABNORMAL LOW (ref 39.0–52.0)
Hemoglobin: 10 g/dL — ABNORMAL LOW (ref 13.0–17.0)
MCH: 24.2 pg — ABNORMAL LOW (ref 26.0–34.0)
MCHC: 30.4 g/dL (ref 30.0–36.0)
MCV: 79.5 fL — ABNORMAL LOW (ref 80.0–100.0)
Platelets: 255 10*3/uL (ref 150–400)
RBC: 4.14 MIL/uL — ABNORMAL LOW (ref 4.22–5.81)
RDW: 14.4 % (ref 11.5–15.5)
WBC: 12.1 10*3/uL — ABNORMAL HIGH (ref 4.0–10.5)
nRBC: 0 % (ref 0.0–0.2)

## 2023-12-29 LAB — BASIC METABOLIC PANEL
Anion gap: 12 (ref 5–15)
BUN: 13 mg/dL (ref 6–20)
CO2: 25 mmol/L (ref 22–32)
Calcium: 9 mg/dL (ref 8.9–10.3)
Chloride: 101 mmol/L (ref 98–111)
Creatinine, Ser: 1.07 mg/dL (ref 0.61–1.24)
GFR, Estimated: >60 mL/min (ref >60)
Glucose, Bld: 107 mg/dL — ABNORMAL HIGH (ref 70–99)
Potassium: 4 mmol/L (ref 3.5–5.1)
Sodium: 138 mmol/L (ref 135–145)

## 2023-12-29 LAB — LIPASE, BLOOD: Lipase: 27 U/L (ref 11–51)

## 2023-12-29 NOTE — Discharge Instructions (Signed)
Follow-up with your doctor as planned.  Return if problems

## 2023-12-29 NOTE — ED Triage Notes (Signed)
 Patient c/o chest pain that is "moving" from chest to stomach describes it as pressure/heaviness. Complains of dizziness and tingling in hands and arms. Patient not diaphoretic or SOB. Hx of MI.

## 2023-12-29 NOTE — ED Provider Notes (Signed)
 [place] EMERGENCY DEPARTMENT AT [facility] Provider Note   CSN: [id number] Arrival date & time: 12/29/23  1926     History {Add pertinent medical, surgical, social history, OB history to HPI:1} Chief Complaint  Patient presents with   Chest Pain    Lance [name] is a 48 y.o. male.  Patient states that he has a history of coronary artery disease with a stent.  He had chest pain for about an hour today but is not having pain now   Chest Pain      Home Medications Prior to Admission medications   Medication Sig Start Date End Date Taking? Authorizing Provider  ALPRAZolam Prudy Feeler) 1 MG tablet Take 1 mg by mouth 2 (two) times daily. 11/05/23   [provider]  alum & mag hydroxide-simeth (MAALOX MAX) 400-400-40 MG/5ML suspension Take 15 mLs by mouth every 6 (six) hours as needed for indigestion. 11/19/23   Kommor, Wyn Forster, MD  aspirin EC 81 MG tablet Take 1 tablet (81 mg total) by mouth daily with breakfast. 04/05/19   Mariea Clonts, Courage, MD  atorvastatin (LIPITOR) 80 MG tablet TAKE 1 TABLET BY MOUTH EVERYDAY AT BEDTIME 07/21/23   Jonelle Sidle, MD  B-D ULTRAFINE III SHORT PEN 31G X 8 MM MISC USE TO INJECT INSULIN 4 (FOUR) TIMES DAILY. 10/01/23   Roma Kayser, MD  blood glucose meter kit and supplies KIT 1 each by Does not apply route 4 (four) times daily. Dispense based on patient and insurance preference. Use up to four times daily as directed. (FOR ICD-9 250.00, 250.01). 02/14/20   Nida, Denman George, MD  BRILINTA 90 MG TABS tablet TAKE 1 TABLET BY MOUTH 2 TIMES DAILY. 10/30/23   Sharlene Dory, NP  Continuous Glucose Sensor (DEXCOM G7 SENSOR) MISC CHANGE SENSOR EVERY 10 DAYS 09/28/23   Roma Kayser, MD  famotidine (PEPCID) 40 MG tablet Take 40 mg by mouth daily. 04/30/23   [provider]  ferrous sulfate 325 (65 FE) MG tablet Take 1 tablet (325 mg total) by mouth every other day. 08/06/23 11/04/23  Ahmed, Juanetta Beets, MD  insulin  aspart (NOVOLOG FLEXPEN) 100 UNIT/ML FlexPen INJECT 20-26 UNITS INTO THE SKIN 3 (THREE) TIMES DAILY WITH MEALS. 11/02/23   Nida, Denman George, MD  insulin glargine (LANTUS SOLOSTAR) 100 UNIT/ML Solostar Pen Inject 80 Units into the skin at bedtime. 11/02/23   Roma Kayser, MD  lisinopril (ZESTRIL) 40 MG tablet TAKE 1 TABLET BY MOUTH EVERY DAY 10/30/23   Jonelle Sidle, MD  nitroGLYCERIN (NITROSTAT) 0.4 MG SL tablet Place 1 tablet (0.4 mg total) under the tongue every 5 (five) minutes x 3 doses as needed for chest pain. 09/02/21   Jonelle Sidle, MD  omeprazole (PRILOSEC) 40 MG capsule Take 40 mg by mouth daily. 05/18/23   [provider]  ondansetron (ZOFRAN-ODT) 4 MG disintegrating tablet Take 1 tablet (4 mg total) by mouth every 8 (eight) hours as needed. 11/14/23   Loetta Rough, MD  OZEMPIC, 0.25 OR 0.5 MG/DOSE, 2 MG/3ML SOPN Inject 1 mg into the skin once a week. 11/08/23   [provider]  Sodium Sulfate-Mag Sulfate-KCl (SUTAB) [phone] MG TABS As directed 07/30/23   Franky Macho, MD      Allergies    Bee venom and Penicillins    Review of Systems   Review of Systems  Cardiovascular:  Positive for chest pain.    Physical Exam Updated Vital Signs BP (!) 155/66  Pulse 67   Temp 98 F (36.7 C) (Oral)   Resp 18   SpO2 97%  Physical Exam  ED Results / Procedures / Treatments   Labs (all labs ordered are listed, but only abnormal results are displayed) Labs Reviewed  BASIC METABOLIC PANEL - Abnormal; Notable for the following components:      Result Value   Glucose, Bld 107 (*)    All other components within normal limits  CBC - Abnormal; Notable for the following components:   WBC 12.1 (*)    RBC 4.14 (*)    Hemoglobin 10.0 (*)    HCT 32.9 (*)    MCV 79.5 (*)    MCH 24.2 (*)    All other components within normal limits  LIPASE, BLOOD  TROPONIN I (HIGH SENSITIVITY)  TROPONIN I (HIGH SENSITIVITY)    EKG None  Radiology DG  Chest 2 View Result Date: 12/29/2023 CLINICAL DATA:  Chest pain. EXAM: CHEST - 2 VIEW COMPARISON:  December 18, 2023. FINDINGS: Stable cardiomediastinal silhouette. Both lungs are clear. The visualized skeletal structures are unremarkable. IMPRESSION: No active cardiopulmonary disease. Electronically Signed   By: Lupita Raider M.D.   On: 12/29/2023 20:04    Procedures Procedures  {Document cardiac monitor, telemetry assessment procedure when appropriate:1}  Medications Ordered in ED Medications - No data to display  ED Course/ Medical Decision Making/ A&P   {   Click here for ABCD2, HEART and other calculatorsREFRESH Note before signing :1}                              Medical Decision Making Amount and/or Complexity of Data Reviewed Labs: ordered. Radiology: ordered.   Patient with chest pain but no acute changes on EKG and normal troponins.  He will follow-up with his family doctor within a week and return if problems  {Document critical care time when appropriate:1} {Document review of labs and clinical decision tools ie heart score, Chads2Vasc2 etc:1}  {Document your independent review of radiology images, and any outside records:1} {Document your discussion with family members, caretakers, and with consultants:1} {Document social determinants of health affecting pt's care:1} {Document your decision making why or why not admission, treatments were needed:1} Final Clinical Impression(s) / ED Diagnoses Final diagnoses:  Atypical chest pain    Rx / DC Orders ED Discharge Orders     None

## 2023-12-29 NOTE — ED Notes (Signed)
 Pt given a sandwich and drink per MD.

## 2024-01-24 ENCOUNTER — Observation Stay (HOSPITAL_COMMUNITY)
Admission: EM | Admit: 2024-01-24 | Discharge: 2024-01-25 | Disposition: A | Discharge status: Home / Self Care | Attending: Internal Medicine | Admitting: Internal Medicine

## 2024-01-24 ENCOUNTER — Emergency Department (HOSPITAL_COMMUNITY)

## 2024-01-24 ENCOUNTER — Encounter (HOSPITAL_COMMUNITY): Payer: Self-pay | Admitting: Emergency Medicine

## 2024-01-24 ENCOUNTER — Other Ambulatory Visit: Payer: Self-pay

## 2024-01-24 DIAGNOSIS — I2510 Atherosclerotic heart disease of native coronary artery without angina pectoris: Secondary | ICD-10-CM | POA: Insufficient documentation

## 2024-01-24 DIAGNOSIS — I255 Ischemic cardiomyopathy: Secondary | ICD-10-CM | POA: Insufficient documentation

## 2024-01-24 DIAGNOSIS — I5022 Chronic systolic (congestive) heart failure: Secondary | ICD-10-CM | POA: Diagnosis present

## 2024-01-24 DIAGNOSIS — K922 Gastrointestinal hemorrhage, unspecified: Secondary | ICD-10-CM | POA: Diagnosis not present

## 2024-01-24 DIAGNOSIS — K921 Melena: Secondary | ICD-10-CM | POA: Diagnosis not present

## 2024-01-24 DIAGNOSIS — I110 Hypertensive heart disease with heart failure: Secondary | ICD-10-CM | POA: Insufficient documentation

## 2024-01-24 DIAGNOSIS — K317 Polyp of stomach and duodenum: Secondary | ICD-10-CM | POA: Insufficient documentation

## 2024-01-24 DIAGNOSIS — D500 Iron deficiency anemia secondary to blood loss (chronic): Principal | ICD-10-CM | POA: Insufficient documentation

## 2024-01-24 DIAGNOSIS — Z7982 Long term (current) use of aspirin: Secondary | ICD-10-CM | POA: Insufficient documentation

## 2024-01-24 DIAGNOSIS — I5032 Chronic diastolic (congestive) heart failure: Secondary | ICD-10-CM | POA: Diagnosis not present

## 2024-01-24 DIAGNOSIS — D649 Anemia, unspecified: Secondary | ICD-10-CM | POA: Diagnosis not present

## 2024-01-24 DIAGNOSIS — Z794 Long term (current) use of insulin: Secondary | ICD-10-CM | POA: Insufficient documentation

## 2024-01-24 DIAGNOSIS — Z79899 Other long term (current) drug therapy: Secondary | ICD-10-CM | POA: Diagnosis not present

## 2024-01-24 DIAGNOSIS — Z87891 Personal history of nicotine dependence: Secondary | ICD-10-CM | POA: Diagnosis not present

## 2024-01-24 DIAGNOSIS — Z7901 Long term (current) use of anticoagulants: Secondary | ICD-10-CM

## 2024-01-24 DIAGNOSIS — R0789 Other chest pain: Principal | ICD-10-CM

## 2024-01-24 DIAGNOSIS — E119 Type 2 diabetes mellitus without complications: Secondary | ICD-10-CM | POA: Insufficient documentation

## 2024-01-24 DIAGNOSIS — D509 Iron deficiency anemia, unspecified: Secondary | ICD-10-CM

## 2024-01-24 DIAGNOSIS — E1159 Type 2 diabetes mellitus with other circulatory complications: Secondary | ICD-10-CM | POA: Diagnosis present

## 2024-01-24 DIAGNOSIS — R1010 Upper abdominal pain, unspecified: Secondary | ICD-10-CM | POA: Diagnosis present

## 2024-01-24 LAB — CBC
HCT: 27.9 % — ABNORMAL LOW (ref 39.0–52.0)
Hemoglobin: 8.4 g/dL — ABNORMAL LOW (ref 13.0–17.0)
MCH: 22.8 pg — ABNORMAL LOW (ref 26.0–34.0)
MCHC: 30.1 g/dL (ref 30.0–36.0)
MCV: 75.6 fL — ABNORMAL LOW (ref 80.0–100.0)
Platelets: 225 10*3/uL (ref 150–400)
RBC: 3.69 MIL/uL — ABNORMAL LOW (ref 4.22–5.81)
RDW: 14.8 % (ref 11.5–15.5)
WBC: 10.3 10*3/uL (ref 4.0–10.5)
nRBC: 0 % (ref 0.0–0.2)

## 2024-01-24 LAB — COMPREHENSIVE METABOLIC PANEL WITH GFR
ALT: 21 U/L (ref 0–44)
AST: 19 U/L (ref 15–41)
Albumin: 3.5 g/dL (ref 3.5–5.0)
Alkaline Phosphatase: 94 U/L (ref 38–126)
Anion gap: 8 (ref 5–15)
BUN: 15 mg/dL (ref 6–20)
CO2: 26 mmol/L (ref 22–32)
Calcium: 8.7 mg/dL — ABNORMAL LOW (ref 8.9–10.3)
Chloride: 100 mmol/L (ref 98–111)
Creatinine, Ser: 1.03 mg/dL (ref 0.61–1.24)
GFR, Estimated: >60 mL/min (ref >60)
Glucose, Bld: 184 mg/dL — ABNORMAL HIGH (ref 70–99)
Potassium: 4 mmol/L (ref 3.5–5.1)
Sodium: 134 mmol/L — ABNORMAL LOW (ref 135–145)
Total Bilirubin: 0.6 mg/dL (ref 0.0–1.2)
Total Protein: 6.5 g/dL (ref 6.5–8.1)

## 2024-01-24 LAB — TROPONIN I (HIGH SENSITIVITY)
Troponin I (High Sensitivity): 15 ng/L (ref <18)
Troponin I (High Sensitivity): 18 ng/L — ABNORMAL HIGH (ref <18)
Troponin I (High Sensitivity): 20 ng/L — ABNORMAL HIGH (ref <18)

## 2024-01-24 LAB — CBC WITH DIFFERENTIAL/PLATELET
Abs Immature Granulocytes: 0.04 10*3/uL (ref 0.00–0.07)
Basophils Absolute: 0.1 10*3/uL (ref 0.0–0.1)
Basophils Relative: 1 %
Eosinophils Absolute: 0.3 10*3/uL (ref 0.0–0.5)
Eosinophils Relative: 3 %
HCT: 29.1 % — ABNORMAL LOW (ref 39.0–52.0)
Hemoglobin: 8.5 g/dL — ABNORMAL LOW (ref 13.0–17.0)
Immature Granulocytes: 0 %
Lymphocytes Relative: 24 %
Lymphs Abs: 2.3 10*3/uL (ref 0.7–4.0)
MCH: 22.5 pg — ABNORMAL LOW (ref 26.0–34.0)
MCHC: 29.2 g/dL — ABNORMAL LOW (ref 30.0–36.0)
MCV: 77.2 fL — ABNORMAL LOW (ref 80.0–100.0)
Monocytes Absolute: 0.9 10*3/uL (ref 0.1–1.0)
Monocytes Relative: 9 %
Neutro Abs: 6.2 10*3/uL (ref 1.7–7.7)
Neutrophils Relative %: 63 %
Platelets: 228 10*3/uL (ref 150–400)
RBC: 3.77 MIL/uL — ABNORMAL LOW (ref 4.22–5.81)
RDW: 14.7 % (ref 11.5–15.5)
WBC: 9.7 10*3/uL (ref 4.0–10.5)
nRBC: 0 % (ref 0.0–0.2)

## 2024-01-24 LAB — TYPE AND SCREEN
ABO/RH(D): O POS
Antibody Screen: NEGATIVE

## 2024-01-24 LAB — BRAIN NATRIURETIC PEPTIDE: B Natriuretic Peptide: 19 pg/mL (ref 0.0–100.0)

## 2024-01-24 LAB — GLUCOSE, CAPILLARY: Glucose-Capillary: 110 mg/dL — ABNORMAL HIGH (ref 70–99)

## 2024-01-24 MED ORDER — ONDANSETRON HCL 4 MG PO TABS: 4.0000 mg | ORAL_TABLET | Freq: Four times a day (QID) | ORAL | Status: DC | PRN

## 2024-01-24 MED ORDER — INSULIN ASPART 100 UNIT/ML IJ SOLN: 0.0000 [IU] | Freq: Every day | INTRAMUSCULAR | Status: DC

## 2024-01-24 MED ORDER — ASPIRIN 81 MG PO TBEC
81.0000 mg | DELAYED_RELEASE_TABLET | Freq: Every day | ORAL | Status: DC
  Filled 2024-01-24: qty 1

## 2024-01-24 MED ORDER — LISINOPRIL 10 MG PO TABS
40.0000 mg | ORAL_TABLET | Freq: Every day | ORAL | Status: DC
  Filled 2024-01-24: qty 4

## 2024-01-24 MED ORDER — ONDANSETRON HCL 4 MG/2ML IJ SOLN: 4.0000 mg | Freq: Four times a day (QID) | INTRAMUSCULAR | Status: DC | PRN

## 2024-01-24 MED ORDER — ATORVASTATIN CALCIUM 40 MG PO TABS
80.0000 mg | ORAL_TABLET | Freq: Every day | ORAL | Status: DC
  Administered 2024-01-24: 80 mg via ORAL
  Filled 2024-01-24: qty 2

## 2024-01-24 MED ORDER — INSULIN GLARGINE-YFGN 100 UNIT/ML ~~LOC~~ SOLN
80.0000 [IU] | Freq: Every day | SUBCUTANEOUS | Status: DC
  Administered 2024-01-24: 80 [IU] via SUBCUTANEOUS
  Filled 2024-01-24 (×2): qty 0.8

## 2024-01-24 MED ORDER — SUCRALFATE 1 GM/10ML PO SUSP
1.0000 g | Freq: Three times a day (TID) | ORAL | Status: DC
  Administered 2024-01-24: 1 g via ORAL
  Filled 2024-01-24 (×2): qty 10

## 2024-01-24 MED ORDER — PANTOPRAZOLE SODIUM 40 MG IV SOLR
40.0000 mg | Freq: Once | INTRAVENOUS | Status: AC
Start: 2024-01-24 — End: 2024-01-24
  Administered 2024-01-24: 40 mg via INTRAVENOUS
  Filled 2024-01-24: qty 10

## 2024-01-24 MED ORDER — PANTOPRAZOLE SODIUM 40 MG IV SOLR
40.0000 mg | INTRAVENOUS | Status: AC
  Administered 2024-01-24: 40 mg via INTRAVENOUS
  Filled 2024-01-24: qty 10

## 2024-01-24 MED ORDER — INSULIN ASPART 100 UNIT/ML FLEXPEN: 20.0000 [IU] | PEN_INJECTOR | Freq: Three times a day (TID) | SUBCUTANEOUS | Status: DC

## 2024-01-24 MED ORDER — INSULIN ASPART 100 UNIT/ML IJ SOLN: 0.0000 [IU] | Freq: Three times a day (TID) | INTRAMUSCULAR | Status: DC

## 2024-01-24 NOTE — ED Provider Notes (Signed)
 Lance [name] EMERGENCY DEPARTMENT AT [facility] Provider Note   CSN: [id number] Arrival date & time: 01/24/24  1220     History {Add pertinent medical, surgical, social history, OB history to HPI:1} Chief Complaint  Patient presents with   Chest Pain    Lance [name] is a 48 y.o. male.  Patient with a history of coronary artery disease he has several stents.  Patient states that he had some chest pain for about an hour today that has gone away.  Patient not complaining of any shortness of breath or black stools.  The history is provided by the patient and medical records. No language interpreter was used.  Chest Pain Pain location:  L chest Pain quality: aching   Pain radiates to:  Does not radiate Pain severity:  Mild Onset quality:  Sudden Timing:  Intermittent Progression:  Waxing and waning Chronicity:  New Context: not breathing   Relieved by:  Nothing Ineffective treatments:  None tried Associated symptoms: no abdominal pain, no back pain, no cough, no fatigue and no headache        Home Medications Prior to Admission medications   Medication Sig Start Date End Date Taking? Authorizing Provider  ALPRAZolam  (XANAX ) 1 MG tablet Take 1 mg by mouth 2 (two) times daily. 11/05/23   [provider]  alum & mag hydroxide-simeth (MAALOX MAX) 400-400-40 MG/5ML suspension Take 15 mLs by mouth every 6 (six) hours as needed for indigestion. 11/19/23   Kommor, Alyse July, MD  aspirin  EC 81 MG tablet Take 1 tablet (81 mg total) by mouth daily with breakfast. 04/05/19   Quintella Buck, Courage, MD  atorvastatin  (LIPITOR ) 80 MG tablet TAKE 1 TABLET BY MOUTH EVERYDAY AT BEDTIME 07/21/23   Gerard Knight, MD  B-D ULTRAFINE III SHORT PEN 31G X 8 MM MISC USE TO INJECT INSULIN  4 (FOUR) TIMES DAILY. 10/01/23   Nida, Gebreselassie W, MD  blood glucose meter kit and supplies KIT 1 each by Does not apply route 4 (four) times daily. Dispense based on patient and insurance preference.  Use up to four times daily as directed. (FOR ICD-9 250.00, 250.01). 02/14/20   Baby Bolt, MD  BRILINTA  90 MG TABS tablet TAKE 1 TABLET BY MOUTH 2 TIMES DAILY. 10/30/23   Lasalle Pointer, NP  Continuous Glucose Sensor (DEXCOM G7 SENSOR) MISC CHANGE SENSOR EVERY 10 DAYS 09/28/23   Nida, Gebreselassie W, MD  famotidine  (PEPCID ) 40 MG tablet Take 40 mg by mouth daily. 04/30/23   [provider]  ferrous sulfate  325 (65 FE) MG tablet Take 1 tablet (325 mg total) by mouth every other day. 08/06/23 11/04/23  Ahmed, Muhammad F, MD  insulin  aspart (NOVOLOG  FLEXPEN) 100 UNIT/ML FlexPen INJECT 20-26 UNITS INTO THE SKIN 3 (THREE) TIMES DAILY WITH MEALS. 11/02/23   Nida, Gebreselassie W, MD  insulin  glargine (LANTUS  SOLOSTAR) 100 UNIT/ML Solostar Pen Inject 80 Units into the skin at bedtime. 11/02/23   Nida, Gebreselassie W, MD  lisinopril  (ZESTRIL ) 40 MG tablet TAKE 1 TABLET BY MOUTH EVERY DAY 10/30/23   Gerard Knight, MD  nitroGLYCERIN  (NITROSTAT ) 0.4 MG SL tablet Place 1 tablet (0.4 mg total) under the tongue every 5 (five) minutes x 3 doses as needed for chest pain. 09/02/21   Gerard Knight, MD  omeprazole  (PRILOSEC) 40 MG capsule Take 40 mg by mouth daily. 05/18/23   [provider]  ondansetron  (ZOFRAN -ODT) 4 MG disintegrating tablet Take 1 tablet (4 mg total) by mouth every 8 (eight)  hours as needed. 11/14/23   Merdis Stalling, MD  OZEMPIC , 0.25 OR 0.5 MG/DOSE, 2 MG/3ML SOPN Inject 1 mg into the skin once a week. 11/08/23   [provider]  pantoprazole  (PROTONIX ) 40 MG tablet Take 40 mg by mouth daily. 01/04/24   [provider]  Sodium Sulfate-Mag Sulfate-KCl (SUTAB ) [phone] MG TABS As directed 07/30/23   Ahmed, Muhammad F, MD      Allergies    Bee venom and Penicillins    Review of Systems   Review of Systems  Constitutional:  Negative for appetite change and fatigue.  HENT:  Negative for congestion, ear discharge and sinus pressure.   Eyes:   Negative for discharge.  Respiratory:  Negative for cough.   Cardiovascular:  Positive for chest pain.  Gastrointestinal:  Negative for abdominal pain and diarrhea.  Genitourinary:  Negative for frequency and hematuria.  Musculoskeletal:  Negative for back pain.  Skin:  Negative for rash.  Neurological:  Negative for seizures and headaches.  Psychiatric/Behavioral:  Negative for hallucinations.     Physical Exam Updated Vital Signs BP (!) 117/54   Pulse 68   Temp 98.2 F (36.8 C) (Oral)   Resp 18   Ht 5\' 11"  (1.803 m)   Wt 131.5 kg   SpO2 99%   BMI 40.45 kg/m  Physical Exam Vitals and nursing note reviewed.  Constitutional:      Appearance: He is well-developed.  HENT:     Head: Normocephalic.     Nose: Nose normal.  Eyes:     General: No scleral icterus.    Conjunctiva/sclera: Conjunctivae normal.  Neck:     Thyroid : No thyromegaly.  Cardiovascular:     Rate and Rhythm: Normal rate and regular rhythm.     Heart sounds: No murmur heard.    No friction rub. No gallop.  Pulmonary:     Breath sounds: No stridor. No wheezing or rales.  Chest:     Chest wall: No tenderness.  Abdominal:     General: There is no distension.     Tenderness: There is no abdominal tenderness. There is no rebound.  Genitourinary:    Comments: Rectal exam Brown stool with heme positive. Musculoskeletal:        General: Normal range of motion.     Cervical back: Neck supple.  Lymphadenopathy:     Cervical: No cervical adenopathy.  Skin:    Findings: No erythema or rash.  Neurological:     Mental Status: He is alert and oriented to person, place, and time.     Motor: No abnormal muscle tone.     Coordination: Coordination normal.  Psychiatric:        Behavior: Behavior normal.     ED Results / Procedures / Treatments   Labs (all labs ordered are listed, but only abnormal results are displayed) Labs Reviewed  CBC WITH DIFFERENTIAL/PLATELET - Abnormal; Notable for the following  components:      Result Value   RBC 3.77 (*)    Hemoglobin 8.5 (*)    HCT 29.1 (*)    MCV 77.2 (*)    MCH 22.5 (*)    MCHC 29.2 (*)    All other components within normal limits  COMPREHENSIVE METABOLIC PANEL WITH GFR - Abnormal; Notable for the following components:   Sodium 134 (*)    Glucose, Bld 184 (*)    Calcium  8.7 (*)    All other components within normal limits  BRAIN NATRIURETIC PEPTIDE  TROPONIN I (HIGH SENSITIVITY)  TROPONIN I (HIGH SENSITIVITY)    EKG None  Radiology DG Chest Port 1 View Result Date: 01/24/2024 CLINICAL DATA:  sob EXAM: PORTABLE CHEST - 1 VIEW COMPARISON:  12/29/2023. FINDINGS: Cardiac silhouette is prominent. There is pulmonary interstitial prominence with vascular congestion. No focal consolidation. No pneumothorax or pleural effusion identified. IMPRESSION: Findings suggest CHF. Electronically Signed   By: Sydell Eva M.D.   On: 01/24/2024 14:34    Procedures Procedures  {Document cardiac monitor, telemetry assessment procedure when appropriate:1}  Medications Ordered in ED Medications  pantoprazole  (PROTONIX ) injection 40 mg (40 mg Intravenous Given 01/24/24 1344)    ED Course/ Medical Decision Making/ A&P  Patient with coronary artery disease with first troponin is normal.  Patient also with heme positive stools. {   Click here for ABCD2, HEART and other calculatorsREFRESH Note before signing :1}                              Medical Decision Making Amount and/or Complexity of Data Reviewed Labs: ordered. Radiology: ordered.  Risk Prescription drug management.   Chest pain and heme positive stool.  Patient will increase his Prilosec to twice a day and will follow-up with his cardiologist this week if his second troponin is negative  {Document critical care time when appropriate:1} {Document review of labs and clinical decision tools ie heart score, Chads2Vasc2 etc:1}  {Document your independent review of radiology images, and  any outside records:1} {Document your discussion with family members, caretakers, and with consultants:1} {Document social determinants of health affecting pt's care:1} {Document your decision making why or why not admission, treatments were needed:1} Final Clinical Impression(s) / ED Diagnoses Final diagnoses:  None    Rx / DC Orders ED Discharge Orders     None

## 2024-01-24 NOTE — H&P (Signed)
 History and Physical    Patient: Lance [name] QMV:[id number] DOB: [birth year] DOA: 01/24/2024 DOS: the patient was seen and examined on 01/24/2024 PCP: Fredick Jarred, PA-C  Patient coming from: Home  Chief Complaint:  Chief Complaint  Patient presents with   Chest Pain   HPI: Lance [name] is a 48 y.o. male with medical history significant of coronary artery disease with a history of STEMI and coronary stents on dual platelet therapy, diabetes, hypertension, GERD.  Patient comes in with generalized abdominal pain and right-sided chest pain.  He is uncertain about whether this chest pain is due to his heart issues or whether there is something else.  Appetite has been normal.  No fevers, chills, nausea, vomiting. Some black stools over the last couple of days.  Review of Systems: As mentioned in the history of present illness. All other systems reviewed and are negative. Past Medical History:  Diagnosis Date   CAD (coronary artery disease), native coronary artery    10/18 PCI/DES to mRCA, and OM, with total occlusion of dLCx with collaterals.    Essential hypertension    GERD (gastroesophageal reflux disease)    History of kidney stones    Hyperlipemia    MI (myocardial infarction) (HCC) 2020   SVT (supraventricular tachycardia) (HCC) 2015   Converted with adenosine    Type 2 diabetes mellitus (HCC)    Past Surgical History:  Procedure Laterality Date   BIOPSY  08/06/2023   Procedure: BIOPSY;  Surgeon: Hargis Lias, MD;  Location: [facility];  Service: Endoscopy;;   CARDIAC SURGERY     COLONOSCOPY WITH PROPOFOL  N/A 08/06/2023   Procedure: COLONOSCOPY WITH PROPOFOL ;  Surgeon: Hargis Lias, MD;  Location: [facility];  Service: Endoscopy;  Laterality: N/A;   CORONARY ANGIOPLASTY WITH STENT PLACEMENT  07/15/2017   "2 stents"   CORONARY BALLOON ANGIOPLASTY N/A 01/16/2023   Procedure: CORONARY BALLOON ANGIOPLASTY;  Surgeon: Millicent Ally, MD;  Location: [facility];  Service: Cardiovascular;  Laterality: N/A;   CORONARY/GRAFT ACUTE MI REVASCULARIZATION N/A 03/12/2019   Procedure: Coronary/Graft Acute MI Revascularization;  Surgeon: Sammy Crisp, MD;  Location: [facility];  Service: Cardiovascular;  Laterality: N/A;   ESOPHAGOGASTRODUODENOSCOPY (EGD) WITH PROPOFOL  N/A 08/06/2023   Procedure: ESOPHAGOGASTRODUODENOSCOPY (EGD) WITH PROPOFOL ;  Surgeon: Hargis Lias, MD;  Location: [facility];  Service: Endoscopy;  Laterality: N/A;  1:30pm;asa 3   HEMOSTASIS CLIP PLACEMENT  08/06/2023   Procedure: HEMOSTASIS CLIP PLACEMENT;  Surgeon: Hargis Lias, MD;  Location: [facility];  Service: Endoscopy;;   LAPAROSCOPIC APPENDECTOMY N/A 11/12/2017   Procedure: APPENDECTOMY LAPAROSCOPIC, REPAIR OF INCARCERATED INCISIONAL HERNIA;  Surgeon: Candyce Champagne, MD;  Location: [facility];  Service: General;  Laterality: N/A;   LAPAROSCOPIC CHOLECYSTECTOMY     LEFT HEART CATH AND CORONARY ANGIOGRAPHY N/A 07/15/2017   Procedure: LEFT HEART CATH AND CORONARY ANGIOGRAPHY;  Surgeon: Arnoldo Lapping, MD;  Location: [facility];  Service: Cardiovascular;  Laterality: N/A;   LEFT HEART CATH AND CORONARY ANGIOGRAPHY N/A 03/12/2019   Procedure: LEFT HEART CATH AND CORONARY ANGIOGRAPHY;  Surgeon: Sammy Crisp, MD;  Location: [facility];  Service: Cardiovascular;  Laterality: N/A;   LEFT HEART CATH AND CORONARY ANGIOGRAPHY N/A 01/16/2023   Procedure: LEFT HEART CATH AND CORONARY ANGIOGRAPHY;  Surgeon: Millicent Ally, MD;  Location: [facility];  Service: Cardiovascular;  Laterality: N/A;   POLYPECTOMY  08/06/2023   Procedure: POLYPECTOMY INTESTINAL;  Surgeon:  Hargis Lias, MD;  Location: [facility];  Service: Endoscopy;;   SVT ABLATION N/A 04/22/2021   Procedure: SVT ABLATION;  Surgeon: Tammie Fall, MD;  Location: [facility];  Service: Cardiovascular;  Laterality: N/A;   Social History:  reports that he quit smoking about 4  years ago. His smoking use included cigarettes. He started smoking about 30 years ago. He has a 6.5 pack-year smoking history. He has never been exposed to tobacco smoke. He has never used smokeless tobacco. He reports that he does not drink alcohol  and does not use drugs.  Allergies  Allergen Reactions   Bee Venom Anaphylaxis and Swelling   Penicillins Hives, Itching and Rash    Family History  Problem Relation Age of Onset   Heart disease Mother 79       CABG   Diabetes Mother    Gout Mother    Heart attack Father 68    Prior to Admission medications   Medication Sig Start Date End Date Taking? Authorizing Provider  aspirin  EC 81 MG tablet Take 1 tablet (81 mg total) by mouth daily with breakfast. 04/05/19  Yes Emokpae, Courage, MD  atorvastatin  (LIPITOR ) 80 MG tablet TAKE 1 TABLET BY MOUTH EVERYDAY AT BEDTIME 07/21/23  Yes Gerard Knight, MD  BRILINTA  90 MG TABS tablet TAKE 1 TABLET BY MOUTH 2 TIMES DAILY. 10/30/23  Yes Lasalle Pointer, NP  famotidine  (PEPCID ) 40 MG tablet Take 40 mg by mouth daily. 04/30/23  Yes [provider]  insulin  aspart (NOVOLOG  FLEXPEN) 100 UNIT/ML FlexPen INJECT 20-26 UNITS INTO THE SKIN 3 (THREE) TIMES DAILY WITH MEALS. 11/02/23  Yes Nida, Gebreselassie W, MD  insulin  glargine (LANTUS  SOLOSTAR) 100 UNIT/ML Solostar Pen Inject 80 Units into the skin at bedtime. 11/02/23  Yes Nida, Jaynee Meyer, MD  lisinopril  (ZESTRIL ) 40 MG tablet TAKE 1 TABLET BY MOUTH EVERY DAY 10/30/23  Yes Gerard Knight, MD  nitroGLYCERIN  (NITROSTAT ) 0.4 MG SL tablet Place 1 tablet (0.4 mg total) under the tongue every 5 (five) minutes x 3 doses as needed for chest pain. 09/02/21  Yes Gerard Knight, MD  OZEMPIC , 0.25 OR 0.5 MG/DOSE, 2 MG/3ML SOPN Inject 1 mg into the skin once a week. 11/08/23  Yes [provider]  pantoprazole  (PROTONIX ) 40 MG tablet Take 40 mg by mouth at bedtime. 01/04/24  Yes [provider]  B-D ULTRAFINE III SHORT PEN 31G X 8 MM  MISC USE TO INJECT INSULIN  4 (FOUR) TIMES DAILY. 10/01/23   Nida, Gebreselassie W, MD  blood glucose meter kit and supplies KIT 1 each by Does not apply route 4 (four) times daily. Dispense based on patient and insurance preference. Use up to four times daily as directed. (FOR ICD-9 250.00, 250.01). 02/14/20   Nida, Jaynee Meyer, MD  Continuous Glucose Sensor (DEXCOM G7 SENSOR) MISC CHANGE SENSOR EVERY 10 DAYS 09/28/23   Baby Bolt, MD    Physical Exam: Vitals:   01/24/24 1615 01/24/24 1630 01/24/24 1645 01/24/24 1916  BP: 129/67 119/70 119/62 125/60  Pulse: 67 65 65 64  Resp: 16 17 18  (!) 21  Temp:  98.2 F (36.8 C)  97.9 F (36.6 C)  TempSrc:  Oral  Oral  SpO2: 99% 98% 98% 98%  Weight:      Height:       General: Middle age male. Awake and alert and oriented x3. No acute cardiopulmonary distress.  HEENT: Normocephalic atraumatic.  Right and left ears normal in  appearance.  Pupils equal, round, reactive to light. Extraocular muscles are intact. Sclerae anicteric and noninjected.  Moist mucosal membranes. No mucosal lesions.  Neck: Neck supple without lymphadenopathy. No carotid bruits. No masses palpated.  Cardiovascular: Regular rate with normal S1-S2 sounds. No murmurs, rubs, gallops auscultated. No JVD.  Respiratory: Good respiratory effort with no wheezes, rales, rhonchi. Lungs clear to auscultation bilaterally.  No accessory muscle use. Abdomen: Diffusely tender abdomen. No rebound tenderness. No masses or hepatosplenomegaly  Skin: No rashes, lesions, or ulcerations.  Dry, warm to touch. 2+ dorsalis pedis and radial pulses. Musculoskeletal: No calf or leg pain. All major joints not erythematous nontender.  No upper or lower joint deformation.  Good ROM.  No contractures  Psychiatric: Intact judgment and insight. Pleasant and cooperative. Neurologic: No focal neurological deficits. Strength is 5/5 and symmetric in upper and lower extremities.  Cranial nerves II through  XII are grossly intact.  Data Reviewed: Results for orders placed or performed during the hospital encounter of 01/24/24 (from the past 24 hours)  CBC with Differential     Status: Abnormal   Collection Time: 01/24/24  1:33 PM  Result Value Ref Range   WBC 9.7 4.0 - 10.5 K/uL   RBC 3.77 (L) 4.22 - 5.81 MIL/uL   Hemoglobin 8.5 (L) 13.0 - 17.0 g/dL   HCT 16.1 (L) 09.6 - 04.5 %   MCV 77.2 (L) 80.0 - 100.0 fL   MCH 22.5 (L) 26.0 - 34.0 pg   MCHC 29.2 (L) 30.0 - 36.0 g/dL   RDW 40.9 81.1 - 91.4 %   Platelets 228 150 - 400 K/uL   nRBC 0.0 0.0 - 0.2 %   Neutrophils Relative % 63 %   Neutro Abs 6.2 1.7 - 7.7 K/uL   Lymphocytes Relative 24 %   Lymphs Abs 2.3 0.7 - 4.0 K/uL   Monocytes Relative 9 %   Monocytes Absolute 0.9 0.1 - 1.0 K/uL   Eosinophils Relative 3 %   Eosinophils Absolute 0.3 0.0 - 0.5 K/uL   Basophils Relative 1 %   Basophils Absolute 0.1 0.0 - 0.1 K/uL   Immature Granulocytes 0 %   Abs Immature Granulocytes 0.04 0.00 - 0.07 K/uL  Comprehensive metabolic panel     Status: Abnormal   Collection Time: 01/24/24  1:33 PM  Result Value Ref Range   Sodium 134 (L) 135 - 145 mmol/L   Potassium 4.0 3.5 - 5.1 mmol/L   Chloride 100 98 - 111 mmol/L   CO2 26 22 - 32 mmol/L   Glucose, Bld 184 (H) 70 - 99 mg/dL   BUN 15 6 - 20 mg/dL   Creatinine, Ser 7.82 0.61 - 1.24 mg/dL   Calcium  8.7 (L) 8.9 - 10.3 mg/dL   Total Protein 6.5 6.5 - 8.1 g/dL   Albumin 3.5 3.5 - 5.0 g/dL   AST 19 15 - 41 U/L   ALT 21 0 - 44 U/L   Alkaline Phosphatase 94 38 - 126 U/L   Total Bilirubin 0.6 0.0 - 1.2 mg/dL   GFR, Estimated >95 >62 mL/min   Anion gap 8 5 - 15  Troponin I (High Sensitivity)     Status: None   Collection Time: 01/24/24  1:33 PM  Result Value Ref Range   Troponin I (High Sensitivity) 15 <18 ng/L  Brain natriuretic peptide     Status: None   Collection Time: 01/24/24  1:33 PM  Result Value Ref Range   B Natriuretic Peptide 19.0 0.0 -  100.0 pg/mL  Troponin I (High Sensitivity)      Status: Abnormal   Collection Time: 01/24/24  3:11 PM  Result Value Ref Range   Troponin I (High Sensitivity) 18 (H) <18 ng/L    DG Chest Port 1 View Result Date: 01/24/2024 CLINICAL DATA:  sob EXAM: PORTABLE CHEST - 1 VIEW COMPARISON:  12/29/2023. FINDINGS: Cardiac silhouette is prominent. There is pulmonary interstitial prominence with vascular congestion. No focal consolidation. No pneumothorax or pleural effusion identified. IMPRESSION: Findings suggest CHF. Electronically Signed   By: Sydell Eva M.D.   On: 01/24/2024 14:34     Assessment and Plan: No notes have been filed under this hospital service. Service: Hospitalist  Principal Problem:   Symptomatic anemia Active Problems:   Chronic anticoagulation (Plavix )   Morbid obesity (HCC)   CAD (coronary artery disease)   Ischemic cardiomyopathy   DM type 2 causing vascular disease (HCC)   Chronic HFimpEF  Symptomatic anemia Check CBC tonight and tomorrow morning Hold Brilinta  Clear liquids and then n.p.o. after midnight Protonix  Carafate  Chronic anticoagulation with dual platelet therapy Hold Brilinta  Upper GI bleed Protonix  and Carafate  Diabetes type 2 CBGs AC and nightly coronary artery disease with chronic CHF Asa statin   Advance Care Planning:   Code Status: Full Code   Consults: GI  Family Communication: none  Severity of Illness: The appropriate patient status for this patient is INPATIENT. Inpatient status is judged to be reasonable and necessary in order to provide the required intensity of service to ensure the patient's safety. The patient's presenting symptoms, physical exam findings, and initial radiographic and laboratory data in the context of their chronic comorbidities is felt to place them at high risk for further clinical deterioration. Furthermore, it is not anticipated that the patient will be medically stable for discharge from the hospital within 2 midnights of admission.   * I certify  that at the point of admission it is my clinical judgment that the patient will require inpatient hospital care spanning beyond 2 midnights from the point of admission due to high intensity of service, high risk for further deterioration and high frequency of surveillance required.*  Author: Zhyon Antenucci J Elyas Villamor, DO 01/24/2024 7:35 PM  For on call review [email or web address].

## 2024-01-24 NOTE — Discharge Instructions (Addendum)
 You need to see the doctors (Dr. Mordechai April) about the blood in your stool, keep taking your medicines - ER for worsening symptoms.  Call the cardiologist and move your appointment up to be seen this week.  Increase your Prilosec so you are taking it twice a day

## 2024-01-24 NOTE — ED Triage Notes (Signed)
 Pt to ER via POV from home with c/o mid sternal chest pain that started 1 hour PTA.  PT denies n/v, endorses SHOB.  States pain radiates to right side of chest.  Pt has hx of MI unsure if this pain is similar.  Pt states was lying down when pain started, nothing changes pain.

## 2024-01-24 NOTE — ED Provider Notes (Signed)
 D/w Dr. Mordechai April re: lower Hgb - with likely GI bleed - requests NPO after midnight - clear liquids till then, Protonix  bid, hold brilinta . Will d/w hospitalist for admission. Pt is agreeable.   Early Glisson, MD 01/24/24 [phone]

## 2024-01-25 ENCOUNTER — Inpatient Hospital Stay (HOSPITAL_COMMUNITY): Admitting: Anesthesiology

## 2024-01-25 ENCOUNTER — Encounter (HOSPITAL_COMMUNITY): Payer: Self-pay

## 2024-01-25 ENCOUNTER — Telehealth: Payer: Self-pay | Admitting: Gastroenterology

## 2024-01-25 ENCOUNTER — Encounter (HOSPITAL_COMMUNITY)
Admission: EM | Disposition: A | Discharge status: Home / Self Care | Payer: Self-pay | Source: Home / Self Care | Attending: Emergency Medicine

## 2024-01-25 DIAGNOSIS — R1013 Epigastric pain: Secondary | ICD-10-CM | POA: Diagnosis not present

## 2024-01-25 DIAGNOSIS — I10 Essential (primary) hypertension: Secondary | ICD-10-CM

## 2024-01-25 DIAGNOSIS — D500 Iron deficiency anemia secondary to blood loss (chronic): Principal | ICD-10-CM

## 2024-01-25 DIAGNOSIS — D649 Anemia, unspecified: Secondary | ICD-10-CM | POA: Diagnosis not present

## 2024-01-25 DIAGNOSIS — D509 Iron deficiency anemia, unspecified: Secondary | ICD-10-CM

## 2024-01-25 DIAGNOSIS — K317 Polyp of stomach and duodenum: Secondary | ICD-10-CM | POA: Diagnosis not present

## 2024-01-25 DIAGNOSIS — I25119 Atherosclerotic heart disease of native coronary artery with unspecified angina pectoris: Secondary | ICD-10-CM

## 2024-01-25 DIAGNOSIS — K2970 Gastritis, unspecified, without bleeding: Secondary | ICD-10-CM

## 2024-01-25 DIAGNOSIS — K921 Melena: Secondary | ICD-10-CM

## 2024-01-25 DIAGNOSIS — Z87891 Personal history of nicotine dependence: Secondary | ICD-10-CM | POA: Diagnosis not present

## 2024-01-25 HISTORY — PX: ESOPHAGOGASTRODUODENOSCOPY: SHX5428

## 2024-01-25 LAB — GLUCOSE, CAPILLARY
Glucose-Capillary: 121 mg/dL — ABNORMAL HIGH (ref 70–99)
Glucose-Capillary: 123 mg/dL — ABNORMAL HIGH (ref 70–99)
Glucose-Capillary: 106 mg/dL — ABNORMAL HIGH (ref 70–99)
Glucose-Capillary: 105 mg/dL — ABNORMAL HIGH (ref 70–99)

## 2024-01-25 LAB — HEMOGLOBIN A1C
Hgb A1c MFr Bld: 7.5 % — ABNORMAL HIGH (ref 4.8–5.6)
Mean Plasma Glucose: 168.55 mg/dL

## 2024-01-25 LAB — BASIC METABOLIC PANEL WITH GFR
Anion gap: 9 (ref 5–15)
BUN: 14 mg/dL (ref 6–20)
CO2: 25 mmol/L (ref 22–32)
Calcium: 8.8 mg/dL — ABNORMAL LOW (ref 8.9–10.3)
Chloride: 103 mmol/L (ref 98–111)
Creatinine, Ser: 0.98 mg/dL (ref 0.61–1.24)
GFR, Estimated: >60 mL/min (ref >60)
Glucose, Bld: 126 mg/dL — ABNORMAL HIGH (ref 70–99)
Potassium: 4 mmol/L (ref 3.5–5.1)
Sodium: 137 mmol/L (ref 135–145)

## 2024-01-25 LAB — FERRITIN: Ferritin: 3 ng/mL — ABNORMAL LOW (ref 24–336)

## 2024-01-25 LAB — CBC
HCT: 28.2 % — ABNORMAL LOW (ref 39.0–52.0)
Hemoglobin: 8.4 g/dL — ABNORMAL LOW (ref 13.0–17.0)
MCH: 22.5 pg — ABNORMAL LOW (ref 26.0–34.0)
MCHC: 29.8 g/dL — ABNORMAL LOW (ref 30.0–36.0)
MCV: 75.6 fL — ABNORMAL LOW (ref 80.0–100.0)
Platelets: 228 10*3/uL (ref 150–400)
RBC: 3.73 MIL/uL — ABNORMAL LOW (ref 4.22–5.81)
RDW: 14.6 % (ref 11.5–15.5)
WBC: 10 10*3/uL (ref 4.0–10.5)
nRBC: 0 % (ref 0.0–0.2)

## 2024-01-25 LAB — IRON AND TIBC
Iron: 15 ug/dL — ABNORMAL LOW (ref 45–182)
Saturation Ratios: 3 % — ABNORMAL LOW (ref 17.9–39.5)
TIBC: 454 ug/dL — ABNORMAL HIGH (ref 250–450)
UIBC: 439 ug/dL

## 2024-01-25 LAB — HIV ANTIBODY (ROUTINE TESTING W REFLEX): HIV Screen 4th Generation wRfx: NONREACTIVE

## 2024-01-25 LAB — TROPONIN I (HIGH SENSITIVITY): Troponin I (High Sensitivity): 18 ng/L — ABNORMAL HIGH (ref <18)

## 2024-01-25 SURGERY — EGD (ESOPHAGOGASTRODUODENOSCOPY)
Anesthesia: General

## 2024-01-25 MED ORDER — PANTOPRAZOLE SODIUM 40 MG IV SOLR
40.0000 mg | Freq: Two times a day (BID) | INTRAVENOUS | Status: DC
  Administered 2024-01-25: 40 mg via INTRAVENOUS
  Filled 2024-01-25: qty 10

## 2024-01-25 MED ORDER — DOCUSATE SODIUM 100 MG PO CAPS: 100.0000 mg | ORAL_CAPSULE | Freq: Two times a day (BID) | ORAL | 2 refills | Status: AC

## 2024-01-25 MED ORDER — PROPOFOL 10 MG/ML IV BOLUS
INTRAVENOUS | Status: DC | PRN
  Administered 2024-01-25: 50 mg via INTRAVENOUS
  Administered 2024-01-25: 100 mg via INTRAVENOUS

## 2024-01-25 MED ORDER — LACTATED RINGERS IV SOLN: INTRAVENOUS | Status: DC | PRN

## 2024-01-25 MED ORDER — FERROUS SULFATE 325 (65 FE) MG PO TBEC: 325.0000 mg | DELAYED_RELEASE_TABLET | Freq: Two times a day (BID) | ORAL | 3 refills | Status: DC

## 2024-01-25 MED ORDER — LIDOCAINE HCL (CARDIAC) PF 100 MG/5ML IV SOSY
PREFILLED_SYRINGE | INTRAVENOUS | Status: DC | PRN
  Administered 2024-01-25: 100 mg via INTRATRACHEAL

## 2024-01-25 MED ORDER — SODIUM CHLORIDE 0.9 % IV SOLN: INTRAVENOUS | Status: DC

## 2024-01-25 MED ORDER — LACTATED RINGERS IV SOLN: INTRAVENOUS | Status: DC

## 2024-01-25 NOTE — Consult Note (Signed)
 @LOGO @   Referring Provider: [facility] Primary Care Physician:  Fredick Jarred, PA-C Primary Gastroenterologist:  Dr. Alita Irwin  Date of Admission: 01/24/24 Date of Consultation: 01/25/2024  Reason for Consultation: Upper abdominal pain, declining hemoglobin , black stool  HPI:  Lance [name] is a 48 y.o. year old male with history of CAD, MI, PCI with DES on dual antiplatelet therapy, LVH, grade 1 diastolic dysfunction, type 2 diabetes, HTN, HLD, GERD, IDA, who presented to the emergency room with chief complaint of midsternal chest pain radiating to the right, shortness of breath, and also reported black stools over the last couple of days.  ED course: BP 151/66, otherwise vital signs within normal limits. Laboratory evaluation remarkable for hemoglobin 8.5 (down from 10.0, 3 weeks prior). Troponins mildly positive: 15>> 18>> 20  Labs this morning with hemoglobin stable at 8.4, troponin 18.. . He has been started on IV Protonix  40 mg twice daily, Carafate  4 times daily.   Today:  Patient states he is not having chest pain, but has been having intermittent upper abdominal pain for the last 6 months.  Primarily in the epigastric area , but can radiate to the right upper quadrant, and creates pressure up in his chest.  Symptoms can occur at any time, but always after eating.  No nausea, vomiting.   Chronic reflux. Takes pantoprazole  40 mg at bedtime and famotidine  40 g in the morning.  This keeps his symptoms well-controlled.  Black stools for about 1 week. Off and on. No iron in a couple months. No pepto in the last 2-3 weeks.  No brbpr.   Bowels fluctuate from constipation to diarrhea.   No dysphagia.    Brilinta : Last dose was morning on 4/20. Also takes daiy 81 mg aspirin .   NSAIDs: None other than prescribed.    Last colonoscopy 08/06/2023: Three 5 to 6 mm polyps in the ascending colon resected and retrieved, one 6 mm polyp in the transverse colon resected and  retrieved with MR conditional clip placed.  Nonbleeding internal and external hemorrhoids.  Recommended repeat colonoscopy in 1 year for surveillance given fair prep.  Will need extended bowel prep.  Pathology with tubular adenomas.  EGD 08/06/2023: Normal esophagus, erythematous mucosa in the stomach biopsied, multiple gastric polyps biopsied, normal examined duodenum.  Pathology showed mild nonspecific reactive gastropathy, no H. pylori, hyperplastic gastric polyp.  Recommended repeat EGD in 1 year.    Past Medical History:  Diagnosis Date   CAD (coronary artery disease), native coronary artery    10/18 PCI/DES to mRCA, and OM, with total occlusion of dLCx with collaterals.    Essential hypertension    GERD (gastroesophageal reflux disease)    History of kidney stones    Hyperlipemia    MI (myocardial infarction) (HCC) 2020   SVT (supraventricular tachycardia) (HCC) 2015   Converted with adenosine    Type 2 diabetes mellitus (HCC)     Past Surgical History:  Procedure Laterality Date   BIOPSY  08/06/2023   Procedure: BIOPSY;  Surgeon: Hargis Lias, MD;  [facility]: [facility];  Service: Endoscopy;;   CARDIAC SURGERY     COLONOSCOPY WITH PROPOFOL  N/A 08/06/2023   Procedure: COLONOSCOPY WITH PROPOFOL ;  Surgeon: Hargis Lias, MD;  Location: [facility];  Service: Endoscopy;  Laterality: N/A;   CORONARY ANGIOPLASTY WITH STENT PLACEMENT  07/15/2017   "2 stents"   CORONARY BALLOON ANGIOPLASTY N/A 01/16/2023   Procedure: CORONARY BALLOON ANGIOPLASTY;  Surgeon: Millicent Ally, MD;  Location: [facility];  Service: Cardiovascular;  Laterality: N/A;   CORONARY/GRAFT ACUTE MI REVASCULARIZATION N/A 03/12/2019   Procedure: Coronary/Graft Acute MI Revascularization;  Surgeon: Sammy Crisp, MD;  Location: [facility];  Service: Cardiovascular;  Laterality: N/A;   ESOPHAGOGASTRODUODENOSCOPY (EGD) WITH PROPOFOL  N/A 08/06/2023   Procedure: ESOPHAGOGASTRODUODENOSCOPY  (EGD) WITH PROPOFOL ;  Surgeon: Hargis Lias, MD;  Location: [facility];  Service: Endoscopy;  Laterality: N/A;  1:30pm;asa 3   HEMOSTASIS CLIP PLACEMENT  08/06/2023   Procedure: HEMOSTASIS CLIP PLACEMENT;  Surgeon: Hargis Lias, MD;  [facility]: [facility];  Service: Endoscopy;;   LAPAROSCOPIC APPENDECTOMY N/A 11/12/2017   Procedure: APPENDECTOMY LAPAROSCOPIC, REPAIR OF INCARCERATED INCISIONAL HERNIA;  Surgeon: Candyce Champagne, MD;  Location: [facility];  Service: General;  Laterality: N/A;   LAPAROSCOPIC CHOLECYSTECTOMY     LEFT HEART CATH AND CORONARY ANGIOGRAPHY N/A 07/15/2017   Procedure: LEFT HEART CATH AND CORONARY ANGIOGRAPHY;  Surgeon: Arnoldo Lapping, MD;  Location: [facility];  Service: Cardiovascular;  Laterality: N/A;   LEFT HEART CATH AND CORONARY ANGIOGRAPHY N/A 03/12/2019   Procedure: LEFT HEART CATH AND CORONARY ANGIOGRAPHY;  Surgeon: Sammy Crisp, MD;  [facility];  Service: Cardiovascular;  Laterality: N/A;   LEFT HEART CATH AND CORONARY ANGIOGRAPHY N/A 01/16/2023   Procedure: LEFT HEART CATH AND CORONARY ANGIOGRAPHY;  Surgeon: Millicent Ally, MD;  Location: [facility];  Service: Cardiovascular;  Laterality: N/A;   POLYPECTOMY  08/06/2023   Procedure: POLYPECTOMY INTESTINAL;  Surgeon: Hargis Lias, MD;  [facility]: [facility];  Service: Endoscopy;;   SVT ABLATION N/A 04/22/2021   Procedure: SVT ABLATION;  Surgeon: Tammie Fall, MD;  [facility]: [facility];  Service: Cardiovascular;  Laterality: N/A;    Prior to Admission medications   Medication Sig Start Date End Date Taking? Authorizing Provider  aspirin  EC 81 MG tablet Take 1 tablet (81 mg total) by mouth daily with breakfast. 04/05/19  Yes Emokpae, Courage, MD  atorvastatin  (LIPITOR ) 80 MG tablet TAKE 1 TABLET BY MOUTH EVERYDAY AT BEDTIME 07/21/23  Yes Gerard Knight, MD  BRILINTA  90 MG TABS tablet TAKE 1 TABLET BY MOUTH 2 TIMES DAILY. 10/30/23  Yes Lasalle Pointer,  NP  famotidine  (PEPCID ) 40 MG tablet Take 40 mg by mouth daily. 04/30/23  Yes [provider]  insulin  aspart (NOVOLOG  FLEXPEN) 100 UNIT/ML FlexPen INJECT 20-26 UNITS INTO THE SKIN 3 (THREE) TIMES DAILY WITH MEALS. 11/02/23  Yes Nida, Gebreselassie W, MD  insulin  glargine (LANTUS  SOLOSTAR) 100 UNIT/ML Solostar Pen Inject 80 Units into the skin at bedtime. 11/02/23  Yes Nida, Jaynee Meyer, MD  lisinopril  (ZESTRIL ) 40 MG tablet TAKE 1 TABLET BY MOUTH EVERY DAY 10/30/23  Yes Gerard Knight, MD  nitroGLYCERIN  (NITROSTAT ) 0.4 MG SL tablet Place 1 tablet (0.4 mg total) under the tongue every 5 (five) minutes x 3 doses as needed for chest pain. 09/02/21  Yes Gerard Knight, MD  OZEMPIC , 0.25 OR 0.5 MG/DOSE, 2 MG/3ML SOPN Inject 1 mg into the skin once a week. 11/08/23  Yes [provider]  pantoprazole  (PROTONIX ) 40 MG tablet Take 40 mg by mouth at bedtime. 01/04/24  Yes [provider]  B-D ULTRAFINE III SHORT PEN 31G X 8 MM MISC USE TO INJECT INSULIN  4 (FOUR) TIMES DAILY. 10/01/23   Nida, Gebreselassie W, MD  blood glucose meter kit and supplies KIT 1 each by Does not apply route 4 (four) times daily.  Dispense based on patient and insurance preference. Use up to four times daily as directed. (FOR ICD-9 250.00, 250.01). 02/14/20   Baby Bolt, MD  Continuous Glucose Sensor (DEXCOM G7 SENSOR) MISC CHANGE SENSOR EVERY 10 DAYS 09/28/23   Baby Bolt, MD    Current Facility-Administered Medications  Medication Dose Route Frequency Provider Last Rate Last Admin   atorvastatin  (LIPITOR ) tablet 80 mg  80 mg Oral Daily Stinson, Jacob J, DO   80 mg at 01/24/24 2233   insulin  aspart (novoLOG ) injection 0-20 Units  0-20 Units Subcutaneous TID WC Stinson, Jacob J, DO       insulin  aspart (novoLOG ) injection 0-5 Units  0-5 Units Subcutaneous QHS Stinson, Jacob J, DO       insulin  glargine-yfgn (SEMGLEE ) injection 80 Units  80 Units Subcutaneous QHS Stinson, Jacob J,  DO   80 Units at 01/24/24 2234   ondansetron  (ZOFRAN ) tablet 4 mg  4 mg Oral Q6H PRN Stinson, Jacob J, DO       Or   ondansetron  (ZOFRAN ) injection 4 mg  4 mg Intravenous Q6H PRN Stinson, Jacob J, DO       pantoprazole  (PROTONIX ) injection 40 mg  40 mg Intravenous Q12H Shah, Pratik D, DO       sucralfate  (CARAFATE ) 1 GM/10ML suspension 1 g  1 g Oral TID WC & HS Stinson, Jacob J, DO   1 g at 01/24/24 2233    Allergies as of 01/24/2024 - Review Complete 01/24/2024  Allergen Reaction Noted   Bee venom Anaphylaxis and Swelling 01/26/2012   Penicillins Hives, Itching, and Rash 11/30/2011    Family History  Problem Relation Age of Onset   Heart disease Mother 65       CABG   Diabetes Mother    Gout Mother    Heart attack Father 63    Social History   Socioeconomic History   Marital status: Married    Spouse name: Not on file   Number of children: Not on file   Years of education: Not on file   Highest education level: Not on file  Occupational History   Occupation: Superintendent    Employer: [employer]  Tobacco Use   Smoking status: Former    Current packs/day: 0.00    Average packs/day: 0.3 packs/day for 26.0 years (6.5 ttl pk-yrs)    Types: Cigarettes    Start date: 03/06/1993    Quit date: 03/07/2019    Years since quitting: 4.8    Passive exposure: Never   Smokeless tobacco: Never  Vaping Use   Vaping status: Never Used  Substance and Sexual Activity   Alcohol  use: No   Drug use: No   Sexual activity: Yes    Birth control/protection: None  Other Topics Concern   Not on file  Social History Narrative   Not on file   Social Drivers of Health   Financial Resource Strain: Not on file  Food Insecurity: No Food Insecurity (01/24/2024)   Hunger Vital Sign    Worried About Running Out of Food in the Last Year: Never true    Ran Out of Food in the Last Year: Never true  Transportation Needs: No Transportation Needs (01/24/2024)   PRAPARE - Therapist, art (Medical): No    Lack of Transportation (Non-Medical): No  Physical Activity: Not on file  Stress: Not on file  Social Connections: Moderately Isolated (01/24/2024)   Social Connection and Isolation Panel [NHANES]  Frequency of Communication with Friends and Family: More than three times a week    Frequency of Social Gatherings with Friends and Family: More than three times a week    Attends Religious Services: Never    Database administrator or Organizations: No    Attends Banker Meetings: Never    Marital Status: Married  Catering manager Violence: Not At Risk (01/24/2024)   Humiliation, Afraid, Rape, and Kick questionnaire    Fear of Current or Ex-Partner: No    Emotionally Abused: No    Physically Abused: No    Sexually Abused: No    Review of Systems: Gen: Denies fever, chills, cold or flulike symptoms, presyncope, syncope. CV: Denies chest pain, heart palpitations. Resp: Denies shortness of breath, cough. GI: See HPI GU : Denies urinary burning, urinary frequency, urinary incontinence.  MS: Denies joint pain. Derm: Denies rash. Psych: Denies depression. Heme: See HPI  Physical Exam: Vital signs in last 24 hours: Temp:  [97.6 F (36.4 C)-98.2 F (36.8 C)] 97.6 F (36.4 C) (04/21 0733) Pulse Rate:  [60-80] 65 (04/21 0733) Resp:  [14-21] 21 (04/20 1916) BP: (108-151)/(52-70) 125/57 (04/21 0733) SpO2:  [97 %-99 %] 98 % (04/21 0733) FiO2 (%):  [21 %] 21 % (04/20 2000) Weight:  [131.5 kg] 131.5 kg (04/20 1232) Last BM Date : 01/24/24 General:   Alert,  Well-developed, well-nourished, pleasant and cooperative in NAD Head:  Normocephalic and atraumatic. Eyes:  Sclera clear, no icterus.   Conjunctiva pink. Ears:  Normal auditory acuity. Nose:  No deformity, discharge,  or lesions. Lungs:  Clear throughout to auscultation.   No wheezes, crackles, or rhonchi. No acute distress. Heart:  Regular rate and rhythm; no murmurs, clicks, rubs,   or gallops. Abdomen:  Soft, and nondistended.  Mild TTP in epigastric area.  No masses, hepatosplenomegaly or hernias noted. Normal bowel sounds, without guarding, and without rebound.   Rectal:  Deferred  Msk:  Symmetrical without gross deformities. Normal posture. Pulses:  Normal pulses noted. Extremities:  Without edema. Neurologic:  Alert and  oriented x4;  grossly normal neurologically. Skin:  Intact without significant lesions or rashes. Cervical Nodes:  No significant cervical adenopathy. Psych:  Normal mood and affect.  Intake/Output from previous day: No intake/output data recorded. Intake/Output this shift: No intake/output data recorded.  Lab Results: Recent Labs    01/24/24 1333 01/24/24 2052 01/25/24 0500  WBC 9.7 10.3 10.0  HGB 8.5* 8.4* 8.4*  HCT 29.1* 27.9* 28.2*  PLT 228 225 228   BMET Recent Labs    01/24/24 1333 01/25/24 0500  NA 134* 137  K 4.0 4.0  CL 100 103  CO2 26 25  GLUCOSE 184* 126*  BUN 15 14  CREATININE 1.03 0.98  CALCIUM  8.7* 8.8*   LFT Recent Labs    01/24/24 1333  PROT 6.5  ALBUMIN 3.5  AST 19  ALT 21  ALKPHOS 94  BILITOT 0.6    Studies/Results: DG Chest Port 1 View Result Date: 01/24/2024 CLINICAL DATA:  sob EXAM: PORTABLE CHEST - 1 VIEW COMPARISON:  12/29/2023. FINDINGS: Cardiac silhouette is prominent. There is pulmonary interstitial prominence with vascular congestion. No focal consolidation. No pneumothorax or pleural effusion identified. IMPRESSION: Findings suggest CHF. Electronically Signed   By: Sydell Eva M.D.   On: 01/24/2024 14:34    Impression: 48 y.o. year old male with history of CAD, MI, PCI with DES on dual antiplatelet therapy, LVH, grade 1 diastolic dysfunction, type 2 diabetes, HTN,  HLD, GERD, IDA, adenomatous colon polyps (colonoscopy 07/2023), hyperplastic gastric polyps (EGD 07/2023), who presented to the emergency room with chief complaint of epigastric abdominal pain radiating to his chest that  is been intermittent for the last 6 months.  Symptoms almost always postprandial, but can occur at random. Also reported black stool intermittently for the last week.  He was found to have decline in hemoglobin to 8.5, down from 10.3, 3 weeks prior.  Hemoglobin has remained stable overnight.  Denies NSAIDs aside from 81 mg aspirin .  Chronically on pantoprazole  and famotidine  daily which keeps typical GERD symptoms well-controlled.  Differentials for upper abdominal pain and suspected melena leading to acute on chronic anemia include PUD, gastritis, duodenitis, esophagitis, hemorrhagic gastric polyps, gastric or possibly small bowel AVMs.  Unable to rule out right-sided colonic lesion contributing to black stool, but  more likely upper GI in etiology considering epigastric abdominal pain. He needs an EGD for further evaluation. Will also update iron panel.     Plan: Proceed with upper endoscopy by Dr. Mordechai April today. The risks, benefits, and alternatives have been discussed with the patient in detail. The patient states understanding and desires to proceed.  Iron panel.  Hold Brilinta  for now.  Continue IV PPI BID Continue Carafate  QID Continue to monitor H/H and for overt GI bleeding.  Further recommendations to follow.    LOS: 1 day    01/25/2024, 8:08 AM   Shana Daring, PA-C [facility]

## 2024-01-25 NOTE — Discharge Planning (Signed)
 Physician Discharge Summary  BEAUMONT AUSTAD ZOX:[id number] DOB: Oct 04, 1976 DOA: 01/24/2024  PCP: Fredick Jarred, PA-C  Admit date: 01/24/2024  Discharge date: 01/25/2024  Admitted From:Home  Disposition:  Home  Recommendations for Outpatient Follow-up:  Follow up with PCP in 1-2 weeks Follow-up with GI recommended outpatient which will be scheduled Follow-up CBC in 1 week Continue iron supplementation with Colace as prescribed Okay to continue other home medications as prior  Home Health: None  Equipment/Devices: None  Discharge Condition:Stable  CODE STATUS: Full  Diet recommendation: Heart Healthy  Brief/Interim Summary: Lance [name] is a 48 y.o. male with medical history significant of coronary artery disease with a history of STEMI and coronary stents on dual platelet therapy, diabetes, hypertension, GERD.  Patient comes in with generalized abdominal pain and right-sided chest pain.  He has been admitted with symptomatic anemia with suspicion of GI bleed noted.  He underwent EGD with multiple gastric polyps and no active bleeding noted.  He is okay to resume his home Brilinta  and repeat CBC in 1 week and follow-up with GI outpatient.  He will be started on oral iron supplementation as prescribed.  Discharge Diagnoses:  Principal Problem:   Symptomatic anemia Active Problems:   Chronic anticoagulation (Plavix )   Morbid obesity (HCC)   CAD (coronary artery disease)   Ischemic cardiomyopathy   DM type 2 causing vascular disease (HCC)   Chronic HFimpEF   Upper GI bleed   Iron deficiency anemia due to chronic blood loss   Multiple gastric polyps  Principal discharge diagnosis: Symptomatic anemia possibly related to GI bleed, with no evidence of upper GI bleeding at this point.  Discharge Instructions  Discharge Instructions     Diet - low sodium heart healthy   Complete by: As directed    Increase activity slowly   Complete by: As directed       Allergies as of  01/25/2024       Reactions   Bee Venom Anaphylaxis, Swelling   Penicillins Hives, Itching, Rash        Medication List     TAKE these medications    aspirin  EC 81 MG tablet Take 1 tablet (81 mg total) by mouth daily with breakfast.   atorvastatin  80 MG tablet Commonly known as: LIPITOR  TAKE 1 TABLET BY MOUTH EVERYDAY AT BEDTIME   B-D ULTRAFINE III SHORT PEN 31G X 8 MM Misc Generic drug: Insulin  Pen Needle USE TO INJECT INSULIN  4 (FOUR) TIMES DAILY.   blood glucose meter kit and supplies Kit 1 each by Does not apply route 4 (four) times daily. Dispense based on patient and insurance preference. Use up to four times daily as directed. (FOR ICD-9 250.00, 250.01).   Brilinta  90 MG Tabs tablet Generic drug: ticagrelor  TAKE 1 TABLET BY MOUTH 2 TIMES DAILY.   Dexcom G7 Sensor Misc CHANGE SENSOR EVERY 10 DAYS   docusate sodium  100 MG capsule Commonly known as: Colace Take 1 capsule (100 mg total) by mouth 2 (two) times daily.   famotidine  40 MG tablet Commonly known as: PEPCID  Take 40 mg by mouth daily.   ferrous sulfate  325 (65 FE) MG EC tablet Take 1 tablet (325 mg total) by mouth 2 (two) times daily.   Lantus  SoloStar 100 UNIT/ML Solostar Pen Generic drug: insulin  glargine Inject 80 Units into the skin at bedtime.   lisinopril  40 MG tablet Commonly known as: ZESTRIL  TAKE 1 TABLET BY MOUTH EVERY DAY   nitroGLYCERIN  0.4 MG SL tablet Commonly known  as: NITROSTAT  Place 1 tablet (0.4 mg total) under the tongue every 5 (five) minutes x 3 doses as needed for chest pain.   NovoLOG  FlexPen 100 UNIT/ML FlexPen Generic drug: insulin  aspart INJECT 20-26 UNITS INTO THE SKIN 3 (THREE) TIMES DAILY WITH MEALS.   Ozempic  (0.25 or 0.5 MG/DOSE) 2 MG/3ML Sopn Generic drug: Semaglutide (0.25 or 0.5MG /DOS) Inject 1 mg into the skin once a week.   pantoprazole  40 MG tablet Commonly known as: PROTONIX  Take 40 mg by mouth at bedtime.        Follow-up Information     Vinetta Greening, DO .   Specialty: Gastroenterology Contact information: [address] [place] [address] [phone]         Fredick Jarred, PA-C. Schedule an appointment as soon as possible for a visit in 1 week(s).   Specialty: Family Medicine Contact information: [address] [place] [address] [phone]                Allergies  Allergen Reactions   Bee Venom Anaphylaxis and Swelling   Penicillins Hives, Itching and Rash    Consultations: GI   Procedures/Studies: DG Chest Port 1 View Result Date: 01/24/2024 CLINICAL DATA:  sob EXAM: PORTABLE CHEST - 1 VIEW COMPARISON:  12/29/2023. FINDINGS: Cardiac silhouette is prominent. There is pulmonary interstitial prominence with vascular congestion. No focal consolidation. No pneumothorax or pleural effusion identified. IMPRESSION: Findings suggest CHF. Electronically Signed   By: Sydell Eva M.D.   On: 01/24/2024 14:34   DG Chest 2 View Result Date: 12/29/2023 CLINICAL DATA:  Chest pain. EXAM: CHEST - 2 VIEW COMPARISON:  December 18, 2023. FINDINGS: Stable cardiomediastinal silhouette. Both lungs are clear. The visualized skeletal structures are unremarkable. IMPRESSION: No active cardiopulmonary disease. Electronically Signed   By: Rosalene Colon M.D.   On: 12/29/2023 20:04     Discharge Exam: Vitals:   01/25/24 1524 01/25/24 1530  BP: (!) 117/52 (!) 108/48  Pulse: 79 69  Resp: 14 14  Temp: 98.1 F (36.7 C)   SpO2: 97% 97%   Vitals:   01/25/24 1329 01/25/24 1431 01/25/24 1524 01/25/24 1530  BP: (!) 125/55 (!) 141/66 (!) 117/52 (!) 108/48  Pulse: 67 64 79 69  Resp:  19 14 14   Temp: 97.8 F (36.6 C) 98.3 F (36.8 C) 98.1 F (36.7 C)   TempSrc: Oral Oral    SpO2: 97% 97% 97% 97%  Weight:  131.5 kg    Height:  5\' 11"  (1.803 m)      General: Pt is alert, awake, not in acute distress Cardiovascular: RRR, S1/S2 +, no rubs, no gallops Respiratory: CTA bilaterally, no wheezing, no rhonchi Abdominal: Soft,  NT, ND, bowel sounds + Extremities: no edema, no cyanosis    The results of significant diagnostics from this hospitalization (including imaging, microbiology, ancillary and laboratory) are listed below for reference.     Microbiology: No results found for this or any previous visit (from the past 240 hours).   Labs: BNP (last 3 results) Recent Labs    01/24/24 1333  BNP 19.0   Basic Metabolic Panel: Recent Labs  Lab 01/24/24 1333 01/25/24 0500  NA 134* 137  K 4.0 4.0  CL 100 103  CO2 26 25  GLUCOSE 184* 126*  BUN 15 14  CREATININE 1.03 0.98  CALCIUM  8.7* 8.8*   Liver Function Tests: Recent Labs  Lab 01/24/24 1333  AST 19  ALT 21  ALKPHOS 94  BILITOT 0.6  PROT 6.5  ALBUMIN 3.5   No results for input(s): "LIPASE", "AMYLASE" in the last 168 hours. No results for input(s): "AMMONIA" in the last 168 hours. CBC: Recent Labs  Lab 01/24/24 1333 01/24/24 2052 01/25/24 0500  WBC 9.7 10.3 10.0  NEUTROABS 6.2  --   --   HGB 8.5* 8.4* 8.4*  HCT 29.1* 27.9* 28.2*  MCV 77.2* 75.6* 75.6*  PLT 228 225 228   Cardiac Enzymes: No results for input(s): "CKTOTAL", "CKMB", "CKMBINDEX", "TROPONINI" in the last 168 hours. BNP: Invalid input(s): "POCBNP" CBG: Recent Labs  Lab 01/24/24 2045 01/25/24 0716 01/25/24 1104 01/25/24 1444 01/25/24 1532  GLUCAP 110* 121* 123* 106* 105*   D-Dimer No results for input(s): "DDIMER" in the last 72 hours. Hgb A1c Recent Labs    01/24/24 2052  HGBA1C 7.5*   Lipid Profile No results for input(s): "CHOL", "HDL", "LDLCALC", "TRIG", "CHOLHDL", "LDLDIRECT" in the last 72 hours. Thyroid  function studies No results for input(s): "TSH", "T4TOTAL", "T3FREE", "THYROIDAB" in the last 72 hours.  Invalid input(s): "FREET3" Anemia work up Recent Labs    01/25/24 0500  FERRITIN 3*  TIBC 454*  IRON 15*   Urinalysis    Component Value Date/Time   COLORURINE YELLOW 10/05/2017 1550   APPEARANCEUR CLEAR 10/05/2017 1550    LABSPEC 1.024 10/05/2017 1550   PHURINE 5.0 10/05/2017 1550   GLUCOSEU NEGATIVE 10/05/2017 1550   HGBUR NEGATIVE 10/05/2017 1550   BILIRUBINUR NEGATIVE 10/05/2017 1550   KETONESUR NEGATIVE 10/05/2017 1550   PROTEINUR NEGATIVE 10/05/2017 1550   UROBILINOGEN 0.2 05/26/2014 0005   NITRITE NEGATIVE 10/05/2017 1550   LEUKOCYTESUR NEGATIVE 10/05/2017 1550   Sepsis Labs Recent Labs  Lab 01/24/24 1333 01/24/24 2052 01/25/24 0500  WBC 9.7 10.3 10.0   Microbiology No results found for this or any previous visit (from the past 240 hours).   Time coordinating discharge: 35 minutes  SIGNED:   Cornelius Dill, DO [facility] 01/25/2024, 3:39 PM  If 7PM-7AM, please contact night-coverage [email or web address]

## 2024-01-25 NOTE — Anesthesia Preprocedure Evaluation (Signed)
 Anesthesia Evaluation  Patient identified by MRN, date of birth, ID band Patient awake    Reviewed: Allergy & Precautions, H&P , NPO status , Patient's Chart, lab work & pertinent test results, reviewed documented beta blocker date and time   Airway Mallampati: II  TM Distance: >3 FB Neck ROM: full    Dental no notable dental hx.    Pulmonary neg pulmonary ROS, Patient abstained from smoking., former smoker   Pulmonary exam normal breath sounds clear to auscultation       Cardiovascular Exercise Tolerance: Good hypertension, + angina  + CAD and + Past MI   Rhythm:regular Rate:Normal     Neuro/Psych negative neurological ROS  negative psych ROS   GI/Hepatic Neg liver ROS,GERD  ,,  Endo/Other  diabetes    Renal/GU negative Renal ROS  negative genitourinary   Musculoskeletal   Abdominal   Peds  Hematology  (+) Blood dyscrasia, anemia   Anesthesia Other Findings   Reproductive/Obstetrics negative OB ROS                             Anesthesia Physical Anesthesia Plan  ASA: 3 and emergent  Anesthesia Plan: General   Post-op Pain Management:    Induction:   PONV Risk Score and Plan: Propofol  infusion  Airway Management Planned:   Additional Equipment:   Intra-op Plan:   Post-operative Plan:   Informed Consent: I have reviewed the patients History and Physical, chart, labs and discussed the procedure including the risks, benefits and alternatives for the proposed anesthesia with the patient or authorized representative who has indicated his/her understanding and acceptance.     Dental Advisory Given  Plan Discussed with: CRNA  Anesthesia Plan Comments:        Anesthesia Quick Evaluation

## 2024-01-25 NOTE — Progress Notes (Signed)
 Transition of Care Department [facility]) has reviewed patient and no other TOC needs have been identified at this time. We will continue to monitor patient advancement through interdisciplinary progression rounds. If new patient transition needs arise, please place a TOC consult.   01/25/24 0844  TOC Brief Assessment  Insurance and Status Reviewed  Patient has primary care physician Yes  Home environment has been reviewed Lives with family.  Prior level of function: Independent.  Prior/Current Home Services No current home services  Social Drivers of Health Review SDOH reviewed no interventions necessary  Readmission risk has been reviewed Yes  Transition of care needs no transition of care needs at this time

## 2024-01-25 NOTE — Transfer of Care (Signed)
 Immediate Anesthesia Transfer of Care Note  Patient: Lance [name]  Procedure(s) Performed: EGD (ESOPHAGOGASTRODUODENOSCOPY)  Patient Location: PACU  Anesthesia Type:General  Level of Consciousness: awake, alert , oriented, and patient cooperative  Airway & Oxygen  Therapy: Patient Spontanous Breathing  Post-op Assessment: Report given to RN, Post -op Vital signs reviewed and stable, and Patient moving all extremities X 4  Post vital signs: Reviewed and stable  Last Vitals:  Vitals Value Taken Time  BP 117/52 01/25/24 1524  Temp 36.7 C 01/25/24 1524  Pulse 81 01/25/24 1526  Resp 17 01/25/24 1526  SpO2 96 % 01/25/24 1526  Vitals shown include unfiled device data.  Last Pain:  Vitals:   01/25/24 1510  TempSrc:   PainSc: 0-No pain         Complications: No notable events documented.

## 2024-01-25 NOTE — Op Note (Signed)
 [facility] Patient Name: Lance [name] Procedure Date: 01/25/2024 2:59 PM MRN: [id number] Date of Birth: May 15, 1976 Attending MD: Rolando Cliche. Mordechai April , [place], [id number] CSN: [id number] Age: 48 Admit Type: Inpatient Procedure:                Upper GI endoscopy Indications:              Iron deficiency anemia secondary to chronic blood                            loss, Melena Providers:                Rolando Cliche. Mordechai April, DO, Willena Harp, Emilee                            Tubb RN, RN, Wilfredo Hanly. Roberta Chin, Technician Referring MD:              Medicines:                See the Anesthesia note for documentation of the                            administered medications Complications:            No immediate complications. Estimated Blood Loss:     Estimated blood loss: none. Procedure:                Pre-Anesthesia Assessment:                           - The anesthesia plan was to use monitored                            anesthesia care (MAC).                           After obtaining informed consent, the endoscope was                            passed under direct vision. Throughout the                            procedure, the patient's blood pressure, pulse, and                            oxygen  saturations were monitored continuously. The                            [id number]-H190 ([id number]) scope was introduced through the                            mouth, and advanced to the second part of duodenum.                            The upper GI endoscopy was accomplished without  difficulty. The patient tolerated the procedure                            well. Scope In: 3:15:39 PM Scope Out: 3:18:10 PM Total Procedure Duration: 0 hours 2 minutes 31 seconds  Findings:      The examined esophagus was normal.      Patchy mild inflammation characterized by erythema was found in the       entire examined stomach.      Multiple 4 to 12 mm sessile polyps with no  bleeding and no stigmata of       recent bleeding were found in the gastric antrum.      The duodenal bulb, first portion of the duodenum and second portion of       the duodenum were normal. Impression:               - Normal esophagus.                           - Gastritis.                           - Multiple gastric polyps.                           - Normal duodenal bulb, first portion of the                            duodenum and second portion of the duodenum.                           - No specimens collected. Moderate Sedation:      Per Anesthesia Care Recommendation:           - Return patient to hospital ward for ongoing care.                           - Resume regular diet.                           - Polyps were previously biopsied and shown to                            hyperplastic polyps, which can bleed on DAPT though                            there is no evidence of bleeding today. Okay to                            resume on Brilinta . Repeat CBC in 1 week. We will                            arrange follow-up in our office. It has been 1 year                            since his last cardiac cath. Ideally will bring  back for outpatient EGD off Brilinta  x 5 days to                            remove all of his polyps. Can also consider small                            bowel capsule endoscopy.                           - Needs oral iron. Consider hematology referral of                            iron infusions. Procedure Code(s):        --- Professional ---                           [phone], Esophagogastroduodenoscopy, flexible,                            transoral; diagnostic, including collection of                            specimen(s) by brushing or washing, when performed                            (separate procedure) Diagnosis Code(s):        --- Professional ---                           K29.70, Gastritis, unspecified, without  bleeding                           K31.7, Polyp of stomach and duodenum                           D50.0, Iron deficiency anemia secondary to blood                            loss (chronic)                           K92.1, Melena (includes Hematochezia) CPT copyright 2022 [employer]. All rights reserved. The codes documented in this report are preliminary and upon coder review may  be revised to meet current compliance requirements. Rolando Cliche. Mordechai April, DO Rolando Cliche. Mordechai April, DO 01/25/2024 3:26:28 PM This report has been signed electronically. Number of Addenda: 0

## 2024-01-25 NOTE — Telephone Encounter (Signed)
 Lance [name]: Please arrange follow-up with Dr. Alita Irwin in 2-4 weeks.  DX: IDA  [name]: Patient will need repeat CBC in 1 week. Please arrange and let patient know which lab to go to. Dx: IDA.    Please note, patient is still hospitalized at this time.

## 2024-01-25 NOTE — Progress Notes (Signed)
 PROGRESS NOTE    Lance [name]  [name]:[id number] DOB: 1976/07/11 DOA: 01/24/2024 PCP: Fredick Jarred, PA-C   Brief Narrative:    Lance [name] is a 48 y.o. male with medical history significant of coronary artery disease with a history of STEMI and coronary stents on dual platelet therapy, diabetes, hypertension, GERD.  Patient comes in with generalized abdominal pain and right-sided chest pain.   Assessment & Plan:   Principal Problem:   Symptomatic anemia Active Problems:   Chronic anticoagulation (Plavix )   Morbid obesity (HCC)   CAD (coronary artery disease)   Ischemic cardiomyopathy   DM type 2 causing vascular disease (HCC)   Chronic HFimpEF   Upper GI bleed  Assessment and Plan:   Symptomatic anemia Check CBC tonight and tomorrow morning Hold Brilinta  Clear liquids and then n.p.o. after midnight Protonix  Carafate  Chronic anticoagulation with dual platelet therapy Hold Brilinta  Upper GI bleed Protonix  and Carafate  Diabetes type 2 CBGs AC and nightly coronary artery disease with chronic CHF Asa Statin 6.   Morbid obesity  BMI 40.45    DVT prophylaxis: SCDs Code Status: Full Family Communication:  Disposition Plan:  Status is: Inpatient Remains inpatient appropriate because: Need for IV medications.  Consultants:  GI  Procedures:  None  Antimicrobials:  None   Subjective: Patient seen and evaluated today with no new acute complaints or concerns. No acute concerns or events noted overnight.  Objective: Vitals:   01/24/24 2000 01/24/24 2300 01/25/24 0300 01/25/24 0733  BP:  120/64 (!) 123/52 (!) 125/57  Pulse:  62 68 65  Resp:      Temp:  98 F (36.7 C) 98 F (36.7 C) 97.6 F (36.4 C)  TempSrc:  Oral Oral Oral  SpO2: 98% 98% 97% 98%  Weight:      Height:       No intake or output data in the 24 hours ending 01/25/24 0741 Filed Weights   01/24/24 1232  Weight: 131.5 kg    Examination:  General exam: Appears calm and comfortable   Respiratory system: Clear to auscultation. Respiratory effort normal. Cardiovascular system: S1 & S2 heard, RRR.  Gastrointestinal system: Abdomen is soft Central nervous system: Alert and awake Extremities: No edema Skin: No significant lesions noted Psychiatry: Flat affect.    Data Reviewed: I have personally reviewed following labs and imaging studies  CBC: Recent Labs  Lab 01/24/24 1333 01/24/24 2052 01/25/24 0500  WBC 9.7 10.3 10.0  NEUTROABS 6.2  --   --   HGB 8.5* 8.4* 8.4*  HCT 29.1* 27.9* 28.2*  MCV 77.2* 75.6* 75.6*  PLT 228 225 228   Basic Metabolic Panel: Recent Labs  Lab 01/24/24 1333 01/25/24 0500  NA 134* 137  K 4.0 4.0  CL 100 103  CO2 26 25  GLUCOSE 184* 126*  BUN 15 14  CREATININE 1.03 0.98  CALCIUM  8.7* 8.8*   GFR: Estimated Creatinine Clearance: 128.9 mL/min (by C-G formula based on SCr of 0.98 mg/dL). Liver Function Tests: Recent Labs  Lab 01/24/24 1333  AST 19  ALT 21  ALKPHOS 94  BILITOT 0.6  PROT 6.5  ALBUMIN 3.5   No results for input(s): "LIPASE", "AMYLASE" in the last 168 hours. No results for input(s): "AMMONIA" in the last 168 hours. Coagulation Profile: No results for input(s): "INR", "PROTIME" in the last 168 hours. Cardiac Enzymes: No results for input(s): "CKTOTAL", "CKMB", "CKMBINDEX", "TROPONINI" in the last 168 hours. BNP (last 3 results) No results for input(s): "PROBNP" in  the last 8760 hours. HbA1C: No results for input(s): "HGBA1C" in the last 72 hours. CBG: Recent Labs  Lab 01/24/24 2045 01/25/24 0716  GLUCAP 110* 121*   Lipid Profile: No results for input(s): "CHOL", "HDL", "LDLCALC", "TRIG", "CHOLHDL", "LDLDIRECT" in the last 72 hours. Thyroid  Function Tests: No results for input(s): "TSH", "T4TOTAL", "FREET4", "T3FREE", "THYROIDAB" in the last 72 hours. Anemia Panel: No results for input(s): "VITAMINB12", "FOLATE", "FERRITIN", "TIBC", "IRON", "RETICCTPCT" in the last 72 hours. Sepsis Labs: No  results for input(s): "PROCALCITON", "LATICACIDVEN" in the last 168 hours.  No results found for this or any previous visit (from the past 240 hours).       Radiology Studies: DG Chest Port 1 View Result Date: 01/24/2024 CLINICAL DATA:  sob EXAM: PORTABLE CHEST - 1 VIEW COMPARISON:  12/29/2023. FINDINGS: Cardiac silhouette is prominent. There is pulmonary interstitial prominence with vascular congestion. No focal consolidation. No pneumothorax or pleural effusion identified. IMPRESSION: Findings suggest CHF. Electronically Signed   By: Sydell Eva M.D.   On: 01/24/2024 14:34    Scheduled Meds:  aspirin  EC  81 mg Oral Q breakfast   atorvastatin   80 mg Oral Daily   insulin  aspart  0-20 Units Subcutaneous TID WC   insulin  aspart  0-5 Units Subcutaneous QHS   insulin  glargine-yfgn  80 Units Subcutaneous QHS   lisinopril   40 mg Oral Daily   sucralfate   1 g Oral TID WC & HS     LOS: 1 day    Time spent: 55 minutes    Yeng [name], DO [facility]  If 7PM-7AM, please contact night-coverage [email or web address] 01/25/2024, 7:41 AM

## 2024-01-26 ENCOUNTER — Encounter (HOSPITAL_COMMUNITY): Payer: Self-pay | Admitting: Internal Medicine

## 2024-01-27 NOTE — Anesthesia Postprocedure Evaluation (Signed)
 Anesthesia Post Note  Patient: Lance [name]  Procedure(s) Performed: EGD (ESOPHAGOGASTRODUODENOSCOPY)  Patient location during evaluation: Phase II Anesthesia Type: General Level of consciousness: awake Pain management: pain level controlled Vital Signs Assessment: post-procedure vital signs reviewed and stable Respiratory status: spontaneous breathing and respiratory function stable Cardiovascular status: blood pressure returned to baseline and stable Postop Assessment: no headache and no apparent nausea or vomiting Anesthetic complications: no Comments: Late entry   No notable events documented.   Last Vitals:  Vitals:   01/25/24 1530 01/25/24 1545  BP: (!) 108/48 (!) 121/52  Pulse: 69 66  Resp: 14 18  Temp:  (!) 36.3 C  SpO2: 97% 96%    Last Pain:  Vitals:   01/25/24 1545  TempSrc:   PainSc: 0-No pain                 Coretha Dew

## 2024-01-28 ENCOUNTER — Other Ambulatory Visit: Payer: Self-pay | Admitting: *Deleted

## 2024-01-28 DIAGNOSIS — D649 Anemia, unspecified: Secondary | ICD-10-CM

## 2024-01-28 NOTE — Telephone Encounter (Signed)
 Spoke to pt informed him of recommendations. Pt voiced understanding. Pt states he is going to PCP next week and will have them done then. Informed to have labs faxed to office.

## 2024-01-29 ENCOUNTER — Other Ambulatory Visit: Payer: Self-pay | Admitting: "Endocrinology

## 2024-01-29 DIAGNOSIS — E1165 Type 2 diabetes mellitus with hyperglycemia: Secondary | ICD-10-CM

## 2024-02-02 ENCOUNTER — Encounter (INDEPENDENT_AMBULATORY_CARE_PROVIDER_SITE_OTHER): Payer: Self-pay | Admitting: Gastroenterology

## 2024-02-02 ENCOUNTER — Telehealth (INDEPENDENT_AMBULATORY_CARE_PROVIDER_SITE_OTHER): Payer: Self-pay | Admitting: Gastroenterology

## 2024-02-02 ENCOUNTER — Ambulatory Visit (INDEPENDENT_AMBULATORY_CARE_PROVIDER_SITE_OTHER): Admitting: Gastroenterology

## 2024-02-02 VITALS — BP 127/75 | HR 86 | Temp 97.5°F | Ht 71.0 in | Wt 289.7 lb

## 2024-02-02 DIAGNOSIS — Z860101 Personal history of adenomatous and serrated colon polyps: Secondary | ICD-10-CM

## 2024-02-02 DIAGNOSIS — Z8719 Personal history of other diseases of the digestive system: Secondary | ICD-10-CM | POA: Diagnosis not present

## 2024-02-02 DIAGNOSIS — D509 Iron deficiency anemia, unspecified: Secondary | ICD-10-CM

## 2024-02-02 DIAGNOSIS — Z860100 Personal history of colon polyps, unspecified: Secondary | ICD-10-CM | POA: Insufficient documentation

## 2024-02-02 DIAGNOSIS — Z6841 Body Mass Index (BMI) 40.0 and over, adult: Secondary | ICD-10-CM | POA: Insufficient documentation

## 2024-02-02 DIAGNOSIS — D500 Iron deficiency anemia secondary to blood loss (chronic): Secondary | ICD-10-CM

## 2024-02-02 NOTE — Telephone Encounter (Signed)
    02/02/24  DRAYCE HOLLINGS 1976-05-26  What type of surgery is being performed? EGD  When is surgery scheduled? TBD  What type of clearance is required (medical or pharmacy to hold medication or both? Medical and pharmacy to hold med  Are there any medications that need to be held prior to surgery and how long? Hold brilinta  for 5 days prior  Name of physician performing surgery?  Dr. Lorie Rook [facility] Phone: [phone] Fax: [phone]  Anethesia type (none, local, MAC, general)? choice

## 2024-02-02 NOTE — Progress Notes (Signed)
 Lance [name] Lance [name] Lance [name] , M.D. Gastroenterology & Hepatology [facility] Gastroenterology [address] [place], [place] [address] Primary Care Physician: Fredick Jarred, PA-C [address] [place] [address]  Chief Complaint:  IDA   History of Present Illness: Lance [name] is a 48 y.o. male with history of CAD, MI, PCI with DES on dual antiplatelet therapy, LVH, grade 1 diastolic dysfunction, type 2 diabetes, HTN, HLD, GERD, IDA,  who presents for evaluation of severe iron deficiency anemia  Patient was recently seen by GI service as inpatient where he presented with concerns of black stool and was admitted with symptomatic anemia.  Patient reports no overt bleeding.  He started iron supplementation couple days ago only. The patient denies having any nausea, vomiting, fever, chills, hematochezia, melena, hematemesis, abdominal distention, abdominal pain, diarrhea, jaundice, pruritus or weight loss.  Last blood work ferritin 3 iron saturation 30 TIBC 454 Hemoglobin 8.4 MCV 75  EGD 01/2024  - Normal esophagus. - Gastritis. - Multiple gastric polyps. - Normal duodenal bulb, first portion of the duodenum and second portion of the duodenum. - No specimens collected.  colonoscopy 08/06/2023: Three 5 to 6 mm polyps in the ascending [name] resected and retrieved, one 6 mm polyp in the transverse [name] resected and retrieved with MR conditional clip placed.  Nonbleeding internal and external hemorrhoids.  Recommended repeat colonoscopy in 1 year for surveillance given fair prep.  Will need extended bowel prep.  Pathology with tubular adenomas.   EGD 08/06/2023: Normal esophagus, erythematous mucosa in the stomach biopsied, multiple gastric polyps biopsied, normal examined duodenum.  Pathology showed mild nonspecific reactive gastropathy, no H. pylori, hyperplastic gastric polyp.  Recommended repeat EGD in 1 year.    A. STOMACH, BIOPSY:  - Gastric antral and oxyntic mucosa with  mild nonspecific reactive  gastropathy  - Helicobacter pylori-like organisms are not identified on routine HE  stain   B. STOMACH POLYP, BIOPSY:  - Gastric hyperplastic polyp  - Negative for intestinal metaplasia or dysplasia   C. [name], ASCENDING, TRANSVERSE, POLYPECTOMY:  - Tubular adenoma(s)  - Negative for high-grade dysplasia or malignancy   Past Medical History: Past Medical History:  Diagnosis Date   CAD (coronary artery disease), native coronary artery    10/18 PCI/DES to mRCA, and OM, with total occlusion of dLCx with collaterals.    Essential hypertension    GERD (gastroesophageal reflux disease)    History of kidney stones    Hyperlipemia    MI (myocardial infarction) (HCC) 2020   SVT (supraventricular tachycardia) (HCC) 2015   Converted with adenosine    Type 2 diabetes mellitus (HCC)     Past Surgical History: Past Surgical History:  Procedure Laterality Date   BIOPSY  08/06/2023   Procedure: BIOPSY;  Surgeon: Hargis Lias, MD;  [facility];  Service: Endoscopy;;   CARDIAC SURGERY     COLONOSCOPY WITH PROPOFOL  N/A 08/06/2023   Procedure: COLONOSCOPY WITH PROPOFOL ;  Surgeon: Hargis Lias, MD;  [facility];  Service: Endoscopy;  Laterality: N/A;   CORONARY ANGIOPLASTY WITH STENT PLACEMENT  07/15/2017   "2 stents"   CORONARY BALLOON ANGIOPLASTY N/A 01/16/2023   Procedure: CORONARY BALLOON ANGIOPLASTY;  Surgeon: Millicent Ally, MD;  [facility];  Service: Cardiovascular;  Laterality: N/A;   CORONARY/GRAFT ACUTE MI REVASCULARIZATION N/A 03/12/2019   Procedure: Coronary/Graft Acute MI Revascularization;  Surgeon: Sammy Crisp, MD;  [facility]: [facility];  Service: Cardiovascular;  Laterality:  N/A;   ESOPHAGOGASTRODUODENOSCOPY N/A 01/25/2024   Procedure: EGD (ESOPHAGOGASTRODUODENOSCOPY);  Surgeon: Vinetta Greening, DO;  [facility];  Service: Endoscopy;  Laterality: N/A;    ESOPHAGOGASTRODUODENOSCOPY (EGD) WITH PROPOFOL  N/A 08/06/2023   Procedure: ESOPHAGOGASTRODUODENOSCOPY (EGD) WITH PROPOFOL ;  Surgeon: Hargis Lias, MD;  [facility];  Service: Endoscopy;  Laterality: N/A;  1:30pm;asa 3   HEMOSTASIS CLIP PLACEMENT  08/06/2023   Procedure: HEMOSTASIS CLIP PLACEMENT;  Surgeon: Hargis Lias, MD;  [facility];  Service: Endoscopy;;   LAPAROSCOPIC APPENDECTOMY N/A 11/12/2017   Procedure: APPENDECTOMY LAPAROSCOPIC, REPAIR OF INCARCERATED INCISIONAL HERNIA;  Surgeon: Candyce Champagne, MD;  Location: [facility];  Service: General;  Laterality: N/A;   LAPAROSCOPIC CHOLECYSTECTOMY     LEFT HEART CATH AND CORONARY ANGIOGRAPHY N/A 07/15/2017   Procedure: LEFT HEART CATH AND CORONARY ANGIOGRAPHY;  Surgeon: Arnoldo Lapping, MD;  Location: [facility] INVASIVE CV LAB;  Service: Cardiovascular;  Laterality: N/A;   LEFT HEART CATH AND CORONARY ANGIOGRAPHY N/A 03/12/2019   Procedure: LEFT HEART CATH AND CORONARY ANGIOGRAPHY;  Surgeon: Sammy Crisp, MD;  [facility];  Service: Cardiovascular;  Laterality: N/A;   LEFT HEART CATH AND CORONARY ANGIOGRAPHY N/A 01/16/2023   Procedure: LEFT HEART CATH AND CORONARY ANGIOGRAPHY;  Surgeon: Millicent Ally, MD;  [facility];  Service: Cardiovascular;  Laterality: N/A;   POLYPECTOMY  08/06/2023   Procedure: POLYPECTOMY INTESTINAL;  Surgeon: Hargis Lias, MD;  [facility];  Service: Endoscopy;;   SVT ABLATION N/A 04/22/2021   Procedure: SVT ABLATION;  Surgeon: Tammie Fall, MD;  [facility];  Service: Cardiovascular;  Laterality: N/A;    Family History: Family History  Problem Relation Age of Onset   Heart disease Mother 63       CABG   Diabetes Mother    Gout Mother    Heart attack Father 77    Social History: Social History   Tobacco Use  Smoking Status Former   Current packs/day: 0.00   Average packs/day: 0.3 packs/day for 26.0 years (6.5 ttl  pk-yrs)   Types: Cigarettes   Start date: 03/06/1993   Quit date: 03/07/2019   Years since quitting: 4.9   Passive exposure: Never  Smokeless Tobacco Never   Social History   Substance and Sexual Activity  Alcohol  Use No   Social History   Substance and Sexual Activity  Drug Use No    Allergies: Allergies  Allergen Reactions   Bee Venom Anaphylaxis and Swelling   Penicillins Hives, Itching and Rash    Medications: Current Outpatient Medications  Medication Sig Dispense Refill   aspirin  EC 81 MG tablet Take 1 tablet (81 mg total) by mouth daily with breakfast. 120 tablet 5   atorvastatin  (LIPITOR ) 80 MG tablet TAKE 1 TABLET BY MOUTH EVERYDAY AT BEDTIME 90 tablet 1   B-D ULTRAFINE III SHORT PEN 31G X 8 MM MISC USE TO INJECT INSULIN  4 (FOUR) TIMES DAILY. 400 each 2   blood glucose meter kit and supplies KIT 1 each by Does not apply route 4 (four) times daily. Dispense based on patient and insurance preference. Use up to four times daily as directed. (FOR ICD-9 250.00, 250.01). 1 each 0   BRILINTA  90 MG TABS tablet TAKE 1 TABLET BY MOUTH 2 TIMES DAILY. 180 tablet 1   Continuous Glucose Sensor (DEXCOM G7 SENSOR) MISC CHANGE SENSOR EVERY 10 DAYS 9 each 1   docusate sodium  (COLACE)  100 MG capsule Take 1 capsule (100 mg total) by mouth 2 (two) times daily. 60 capsule 2   famotidine  (PEPCID ) 40 MG tablet Take 40 mg by mouth daily.     ferrous sulfate  325 (65 FE) MG EC tablet Take 1 tablet (325 mg total) by mouth 2 (two) times daily. 60 tablet 3   insulin  aspart (NOVOLOG  FLEXPEN) 100 UNIT/ML FlexPen INJECT 20-26 UNITS INTO THE SKIN 3 (THREE) TIMES DAILY WITH MEALS. 75 mL 0   insulin  glargine (LANTUS  SOLOSTAR) 100 UNIT/ML Solostar Pen INJECT 80 UNITS INTO THE SKIN AT BEDTIME. 75 mL 0   lisinopril  (ZESTRIL ) 40 MG tablet TAKE 1 TABLET BY MOUTH EVERY DAY 90 tablet 1   nitroGLYCERIN  (NITROSTAT ) 0.4 MG SL tablet Place 1 tablet (0.4 mg total) under the tongue every 5 (five) minutes x 3 doses as  needed for chest pain. 25 tablet 3   OZEMPIC , 0.25 OR 0.5 MG/DOSE, 2 MG/3ML SOPN Inject 1 mg into the skin once a week.     pantoprazole  (PROTONIX ) 40 MG tablet Take 40 mg by mouth at bedtime.     No current facility-administered medications for this visit.    Review of Systems: GENERAL: negative for malaise, night sweats HEENT: No changes in hearing or vision, no nose bleeds or other nasal problems. NECK: Negative for lumps, goiter, pain and significant neck swelling RESPIRATORY: Negative for cough, wheezing CARDIOVASCULAR: Negative for chest pain, leg swelling, palpitations, orthopnea GI: SEE HPI MUSCULOSKELETAL: Negative for joint pain or swelling, back pain, and muscle pain. SKIN: Negative for lesions, rash HEMATOLOGY Negative for prolonged bleeding, bruising easily, and swollen nodes. ENDOCRINE: Negative for cold or heat intolerance, polyuria, polydipsia and goiter. NEURO: negative for tremor, gait imbalance, syncope and seizures. The remainder of the review of systems is noncontributory.   Physical Exam: BP 127/75 (BP Location: Right Arm, Patient Position: Sitting, Cuff Size: Large)   Pulse 86   Temp (!) 97.5 F (36.4 C) (Temporal)   Ht 5\' 11"  (1.803 m)   Wt 289 lb 11.2 oz (131.4 kg)   BMI 40.40 kg/m  GENERAL: The patient is AO x3, in no acute distress. HEENT: Head is normocephalic and atraumatic. EOMI are intact. Mouth is well hydrated and without lesions. NECK: Supple. No masses LUNGS: Clear to auscultation. No presence of rhonchi/wheezing/rales. Adequate chest expansion HEART: RRR, normal s1 and s2. ABDOMEN: Soft, nontender, no guarding, no peritoneal signs, and nondistended. BS +. No masses.  Imaging/Labs: as above     Latest Ref Rng & Units 01/25/2024    5:00 AM 01/24/2024    8:52 PM 01/24/2024    1:33 PM  CBC  WBC 4.0 - 10.5 K/uL 10.0  10.3  9.7   Hemoglobin 13.0 - 17.0 g/dL 8.4  8.4  8.5   Hematocrit 39.0 - 52.0 % 28.2  27.9  29.1   Platelets 150 - 400 K/uL  228  225  228    Lab Results  Component Value Date   IRON 15 (L) 01/25/2024   TIBC 454 (H) 01/25/2024   FERRITIN 3 (L) 01/25/2024    I personally reviewed and interpreted the available labs, imaging and endoscopic files.  Impression and Plan:  Lance [name] is a 48 y.o. male with history of CAD, MI, PCI with DES on dual antiplatelet therapy, LVH, grade 1 diastolic dysfunction, type 2 diabetes, HTN, HLD, GERD, IDA,  who presents for evaluation of severe iron deficiency anemia  #Severe IDA  Last blood work ferritin 3 iron  saturation 30 TIBC 454 Hemoglobin 8.4 MCV 75  Patient had 2 upper endoscopies only found to have nonbleeding gastric hyperplastic polyp not removed as patient is on dual antiplatelet and colonoscopy done last year with subcentimeter tubular adenoma  Recs:  Hematology referral for severe iron deficiency anemia  Capsule endoscopy to evaluate small bowel for any lesion explaining iron deficiency  Urinalysis evaluate for any microscopic hematuria.  Celiac serologies  It has been 1 year since his last cardiac cath would ideally off Brilinta  x 5 days to remove all of his gastric polyps.  Continue aspirin  regardless  Oral iron with MiraLAX   Repeat colonoscopy 08/2024  All questions were answered.      Arianne Klinge Lance [name] Alliana Mcauliff, MD Gastroenterology and Hepatology [facility] Gastroenterology   This chart has been completed using [facility] Dictation software, and while attempts have been made to ensure accuracy , certain words and phrases may not be transcribed as intended

## 2024-02-02 NOTE — Telephone Encounter (Signed)
 Preoperative team, patient has upcoming appointment with Dr. Londa Rival on 02/10/2024.  I will defer preoperative cardiac evaluation to upcoming appointment.  Please add preoperative cardiac evaluation to appointment notes.  Thank you for your help.  Chet Cota. Stefannie Defeo NP-C     02/02/2024, 4:10 PM [facility] [address] Office [phone] Fax [phone]

## 2024-02-02 NOTE — Patient Instructions (Signed)
 It was very nice to meet you today, as dicussed with will plan for the following :  1) capsule endoscopy   2) upper endoscopy only once you are able to hold Brilinta    3) see Hematology (blood doctors)   4) take oral iron

## 2024-02-03 NOTE — Telephone Encounter (Signed)
 Patient has an upcoming appointment with Dr. Londa Rival per preop APP clearance can be addressed than have added preop clearance needed on appointment note

## 2024-02-04 ENCOUNTER — Inpatient Hospital Stay: Attending: Oncology | Admitting: Oncology

## 2024-02-04 ENCOUNTER — Inpatient Hospital Stay

## 2024-02-04 VITALS — BP 111/59 | HR 72 | Temp 97.3°F | Resp 20 | Ht 70.08 in | Wt 287.9 lb

## 2024-02-04 DIAGNOSIS — I10 Essential (primary) hypertension: Secondary | ICD-10-CM | POA: Diagnosis not present

## 2024-02-04 DIAGNOSIS — R5383 Other fatigue: Secondary | ICD-10-CM | POA: Diagnosis not present

## 2024-02-04 DIAGNOSIS — R0609 Other forms of dyspnea: Secondary | ICD-10-CM | POA: Insufficient documentation

## 2024-02-04 DIAGNOSIS — D509 Iron deficiency anemia, unspecified: Secondary | ICD-10-CM | POA: Insufficient documentation

## 2024-02-04 DIAGNOSIS — E119 Type 2 diabetes mellitus without complications: Secondary | ICD-10-CM | POA: Insufficient documentation

## 2024-02-04 DIAGNOSIS — D500 Iron deficiency anemia secondary to blood loss (chronic): Secondary | ICD-10-CM

## 2024-02-04 NOTE — Assessment & Plan Note (Signed)
 The most likely cause of his anemia is due to chronic blood loss.  Lab Results  Component Value Date   IRON 15 (L) 01/25/2024   TIBC 454 (H) 01/25/2024   FERRITIN 3 (L) 01/25/2024    Last colonoscopy/endoscopy:  Endoscopy 01/25/2024: Gastric polyps, no active bleeding Colonoscopy: 08/06/2023: No active bleeding   -We discussed some of the risks, benefits, and alternatives of intravenous iron infusions. The patient is symptomatic from anemia and the iron level is critically low. He tolerated oral iron supplement poorly and desires to achieve higher levels of iron faster for adequate hematopoesis. Some of the side-effects to be expected including risks of infusion reactions, phlebitis, headaches, nausea and fatigue.  The patient is willing to proceed. Patient education material was dispensed. Goal is to keep ferritin level greater than 50 and resolution of anemia -Continue oral iron but decrease it to every other day.  Use MiraLAX  for constipation - Continue to follow with GI.  Will await capsule endoscopy results  Return to clinic in 6 weeks with labs to assess response to IV iron

## 2024-02-04 NOTE — Patient Instructions (Signed)
 VISIT SUMMARY:  Today, we discussed your ongoing issues with black stools and weakness, which are related to your severe iron deficiency anemia. Despite previous endoscopies and colonoscopies, the source of bleeding has not been identified, and a capsule endoscopy is planned.   YOUR PLAN:  -IRON DEFICIENCY ANEMIA: Iron deficiency anemia occurs when your body doesn't have enough iron to produce adequate levels of hemoglobin, which is necessary for carrying oxygen  in your blood. We will change your oral iron supplementation to every other day and administer two doses of IV iron, totaling one gram, with premedication of cetirizine and acetaminophen  to help with any side effects. You should also increase your intake of green vegetables and protein. We will recheck your labs in six weeks and await the results of your capsule endoscopy to further investigate the source of bleeding.   INSTRUCTIONS:  Please schedule a follow-up appointment in six weeks to monitor your response to the treatment and to review the results of your capsule endoscopy. Coordinate with the office to schedule your lab tests and the capsule endoscopy with Dr. Alita Irwin.

## 2024-02-04 NOTE — Progress Notes (Signed)
 [facility]  HEMATOLOGY NEW VISIT  Fredick Jarred, PA-C  REASON FOR REFERRAL: Iron deficiency anemia  HISTORY OF PRESENT ILLNESS: Lance [name] 48 y.o. male referred for iron deficiency anemia.  A past medical history of diabetes and hypertension.  He was recently admitted to the hospital for anemia with melena.  He then underwent endoscopy showing gastric polyps but no active bleeding.  He was referred by Dr. Alita Irwin for iron deficiency anemia  Patient continues to report severe fatigue, dyspnea on exertion.  He continues to notice black-colored stools.  He denies abdominal pain, loss of appetite, loss of weight.  No active blood in stools.  He is taking currently iron twice a day but has no constipation from it.  Smoker, does not drink alcohol .  No family history of colon cancer  I have reviewed the past medical history, past surgical history, social history and family history with the patient   ALLERGIES:  is allergic to bee venom and penicillins.  MEDICATIONS:  Current Outpatient Medications  Medication Sig Dispense Refill   aspirin  EC 81 MG tablet Take 1 tablet (81 mg total) by mouth daily with breakfast. 120 tablet 5   atorvastatin  (LIPITOR ) 80 MG tablet TAKE 1 TABLET BY MOUTH EVERYDAY AT BEDTIME 90 tablet 1   B-D ULTRAFINE III SHORT PEN 31G X 8 MM MISC USE TO INJECT INSULIN  4 (FOUR) TIMES DAILY. 400 each 2   blood glucose meter kit and supplies KIT 1 each by Does not apply route 4 (four) times daily. Dispense based on patient and insurance preference. Use up to four times daily as directed. (FOR ICD-9 250.00, 250.01). 1 each 0   BRILINTA  90 MG TABS tablet TAKE 1 TABLET BY MOUTH 2 TIMES DAILY. 180 tablet 1   Continuous Glucose Sensor (DEXCOM G7 SENSOR) MISC CHANGE SENSOR EVERY 10 DAYS 9 each 1   docusate sodium  (COLACE) 100 MG capsule Take 1 capsule (100 mg total) by mouth 2 (two) times daily. 60 capsule 2   famotidine  (PEPCID ) 40 MG tablet Take  40 mg by mouth daily.     ferrous sulfate  325 (65 FE) MG EC tablet Take 1 tablet (325 mg total) by mouth 2 (two) times daily. 60 tablet 3   insulin  aspart (NOVOLOG  FLEXPEN) 100 UNIT/ML FlexPen INJECT 20-26 UNITS INTO THE SKIN 3 (THREE) TIMES DAILY WITH MEALS. 75 mL 0   insulin  glargine (LANTUS  SOLOSTAR) 100 UNIT/ML Solostar Pen INJECT 80 UNITS INTO THE SKIN AT BEDTIME. 75 mL 0   insulin  glargine-yfgn (SEMGLEE ) 100 UNIT/ML Pen SMARTSIG:80 Unit(s) SUB-Q Every Night     lisinopril  (ZESTRIL ) 40 MG tablet TAKE 1 TABLET BY MOUTH EVERY DAY 90 tablet 1   nitroGLYCERIN  (NITROSTAT ) 0.4 MG SL tablet Place 1 tablet (0.4 mg total) under the tongue every 5 (five) minutes x 3 doses as needed for chest pain. 25 tablet 3   OZEMPIC , 0.25 OR 0.5 MG/DOSE, 2 MG/3ML SOPN Inject 1 mg into the skin once a week.     pantoprazole  (PROTONIX ) 40 MG tablet Take 40 mg by mouth at bedtime.     No current facility-administered medications for this visit.     REVIEW OF SYSTEMS:   Constitutional: Denies fevers, chills or night sweats Eyes: Denies blurriness of vision Ears, nose, mouth, throat, and face: Denies mucositis or sore throat Respiratory: Denies cough, dyspnea or wheezes Cardiovascular: Denies palpitation, chest discomfort or lower extremity swelling Gastrointestinal:  Denies nausea, heartburn or change in bowel  habits Skin: Denies abnormal skin rashes Lymphatics: Denies new lymphadenopathy or easy bruising Neurological:Denies numbness, tingling or new weaknesses Behavioral/Psych: Mood is stable, no new changes  All other systems were reviewed with the patient and are negative.  PHYSICAL EXAMINATION:   Vitals:   02/04/24 1120  BP: (!) 111/59  Pulse: 72  Resp: 20  Temp: (!) 97.3 F (36.3 C)  SpO2: 99%    GENERAL:alert, no distress and comfortable SKIN: skin color, texture, turgor are normal, no rashes or significant lesions LUNGS: clear to auscultation and percussion with normal breathing  effort HEART: regular rate & rhythm and no murmurs and no lower extremity edema ABDOMEN:abdomen soft, non-tender and normal bowel sounds Musculoskeletal:no cyanosis of digits and no clubbing  NEURO: alert & oriented x 3 with fluent speech   LABORATORY DATA:  I have reviewed the data as listed  Lab Results  Component Value Date   WBC 10.0 01/25/2024   NEUTROABS 6.2 01/24/2024   HGB 8.4 (L) 01/25/2024   HCT 28.2 (L) 01/25/2024   MCV 75.6 (L) 01/25/2024   PLT 228 01/25/2024       Chemistry      Component Value Date/Time   NA 137 01/25/2024 0500   NA 139 09/22/2022 0756   K 4.0 01/25/2024 0500   CL 103 01/25/2024 0500   CO2 25 01/25/2024 0500   BUN 14 01/25/2024 0500   BUN 16 09/22/2022 0756   CREATININE 0.98 01/25/2024 0500   CREATININE 0.91 04/30/2020 0728      Component Value Date/Time   CALCIUM  8.8 (L) 01/25/2024 0500   ALKPHOS 94 01/24/2024 1333   AST 19 01/24/2024 1333   ALT 21 01/24/2024 1333   BILITOT 0.6 01/24/2024 1333   BILITOT 0.6 09/22/2022 0756      Latest Reference Range & Units 01/25/24 05:00  Iron 45 - 182 ug/dL 15 (L)  UIBC ug/dL 161  TIBC 096 - 045 ug/dL 409 (H)  Saturation Ratios 17.9 - 39.5 % 3 (L)  Ferritin 24 - 336 ng/mL 3 (L)  (L): Data is abnormally low (H): Data is abnormally high  Upper endoscopy: 01/25/2024: Impression:  - Normal esophagus.  - Gastritis.  - Multiple gastric polyps.  - Normal duodenal bulb, first portion of the duodenum and second portion of the duodenum.  - No specimens collected.  Colonoscopy: 08/06/2023: Impression:  - Three 5 to 6 mm polyps in the ascending colon, removed with a cold snare. Resected and retrieved. - One 6 mm polyp in the transverse colon, removed with a cold snare. Resected and retrieved. Clip ( MR conditional) was placed. Clip manufacturer: [employer]. - Non- bleeding external and internal hemorrhoids.  Pathology: FINAL MICROSCOPIC DIAGNOSIS:   A. STOMACH, BIOPSY:  - Gastric antral  and oxyntic mucosa with mild nonspecific reactive  gastropathy  - Helicobacter pylori-like organisms are not identified on routine HE  stain   B. STOMACH POLYP, BIOPSY:  - Gastric hyperplastic polyp  - Negative for intestinal metaplasia or dysplasia   C. COLON, ASCENDING, TRANSVERSE, POLYPECTOMY:  - Tubular adenoma(s)  - Negative for high-grade dysplasia or malignancy   ASSESSMENT & PLAN:  Patient is a 48 y.o. male referred for iron deficiency anemia  Iron deficiency anemia The most likely cause of his anemia is due to chronic blood loss.  Lab Results  Component Value Date   IRON 15 (L) 01/25/2024   TIBC 454 (H) 01/25/2024   FERRITIN 3 (L) 01/25/2024    Last colonoscopy/endoscopy:  Endoscopy 01/25/2024:  Gastric polyps, no active bleeding Colonoscopy: 08/06/2023: No active bleeding   -We discussed some of the risks, benefits, and alternatives of intravenous iron infusions. The patient is symptomatic from anemia and the iron level is critically low. He tolerated oral iron supplement poorly and desires to achieve higher levels of iron faster for adequate hematopoesis. Some of the side-effects to be expected including risks of infusion reactions, phlebitis, headaches, nausea and fatigue.  The patient is willing to proceed. Patient education material was dispensed. Goal is to keep ferritin level greater than 50 and resolution of anemia -Continue oral iron but decrease it to every other day.  Use MiraLAX  for constipation - Continue to follow with GI.  Will await capsule endoscopy results  Return to clinic in 6 weeks with labs to assess response to IV iron    Orders Placed This Encounter  Procedures   Ferritin    Standing Status:   Future    Expected Date:   03/14/2024    Expiration Date:   02/03/2025   Folate    Standing Status:   Future    Expected Date:   03/14/2024    Expiration Date:   02/03/2025   Vitamin B12    Standing Status:   Future    Expected Date:   03/14/2024     Expiration Date:   02/03/2025   CBC with Differential/Platelet    Standing Status:   Future    Expected Date:   03/14/2024    Expiration Date:   02/03/2025   Comprehensive metabolic panel with GFR    Standing Status:   Future    Expected Date:   03/14/2024    Expiration Date:   02/03/2025   Iron and TIBC    Standing Status:   Future    Expected Date:   03/14/2024    Expiration Date:   02/03/2025    The total time spent in the appointment was 40 minutes encounter with patients including review of chart and various tests results, discussions about plan of care and coordination of care plan   All questions were answered. The patient knows to call the clinic with any problems, questions or concerns. No barriers to learning was detected.   Eduardo Grade, MD 5/1/20251:14 PM

## 2024-02-10 ENCOUNTER — Encounter: Payer: Self-pay | Admitting: Cardiology

## 2024-02-10 ENCOUNTER — Ambulatory Visit: Attending: Cardiology | Admitting: Cardiology

## 2024-02-10 VITALS — BP 148/62 | HR 88 | Ht 71.0 in | Wt 290.8 lb

## 2024-02-10 DIAGNOSIS — I10 Essential (primary) hypertension: Secondary | ICD-10-CM

## 2024-02-10 DIAGNOSIS — I25119 Atherosclerotic heart disease of native coronary artery with unspecified angina pectoris: Secondary | ICD-10-CM | POA: Diagnosis not present

## 2024-02-10 DIAGNOSIS — E782 Mixed hyperlipidemia: Secondary | ICD-10-CM | POA: Diagnosis not present

## 2024-02-10 DIAGNOSIS — I4719 Other supraventricular tachycardia: Secondary | ICD-10-CM | POA: Diagnosis not present

## 2024-02-10 MED ORDER — TICAGRELOR 60 MG PO TABS: 60.0000 mg | ORAL_TABLET | Freq: Two times a day (BID) | ORAL | 11 refills | Status: DC

## 2024-02-10 NOTE — Patient Instructions (Signed)
 Medication Instructions:   DECREASE Brilinta  to 60 mg twice a day  Labwork: None today  Testing/Procedures: None today  Follow-Up: 6 months  Any Other Special Instructions Will Be Listed Below (If Applicable).  If you need a refill on your cardiac medications before your next appointment, please call your pharmacy.

## 2024-02-10 NOTE — Progress Notes (Signed)
    Cardiology Office Note  Date: 02/10/2024   ID: Lance [name], DOB 07-Mar-1976, MRN [id number]  History of Present Illness: Lance [name] is a 48 y.o. male last seen in October 2024.  He is here for a follow-up visit.  He does not report any angina or interval nitroglycerin  use, stable dyspnea on exertion in the setting of iron deficiency anemia which is still being evaluated by GI.  He is scheduled to undergo an EGD next week with request to hold antiplatelet therapy.  We went over his medications.  He reports compliance with his current regimen.  We did discuss reducing Brilinta  to 60 mg twice daily for chronic use.  He remains on Lipitor  80 mg daily with last LDL 73.  I reviewed his recent ECG from April.  Physical Exam: VS:  BP (!) 148/62   Pulse 88   Ht 5\' 11"  (1.803 m)   Wt 290 lb 12.8 oz (131.9 kg)   SpO2 97%   BMI 40.56 kg/m , BMI Body mass index is 40.56 kg/m.  Wt Readings from Last 3 Encounters:  02/10/24 290 lb 12.8 oz (131.9 kg)  02/04/24 287 lb 14.7 oz (130.6 kg)  02/02/24 289 lb 11.2 oz (131.4 kg)    General: Patient appears comfortable at rest. HEENT: Conjunctiva and lids normal. Neck: Supple, no elevated JVP or carotid bruits. Lungs: Clear to auscultation, nonlabored breathing at rest. Cardiac: Regular rate and rhythm, no S3, 2/6 systolic murmur. Extremities: No pitting edema.  ECG:  An ECG dated 01/24/2024 was personally reviewed today and demonstrated:  Sinus rhythm with old high lateral infarct pattern.  Labwork: 01/24/2024: ALT 21; AST 19; B Natriuretic Peptide 19.0 01/25/2024: BUN 14; Creatinine, Ser 0.98; Hemoglobin 8.4; Platelets 228; Potassium 4.0; Sodium 137  October 2024: Cholesterol 130, triglycerides 81, HDL 34, LDL 73, TSH 1.39  Other Studies Reviewed Today:  No interval cardiac testing for review today.  Assessment and Plan:  1.  CAD status post DES to the mid RCA and OM in October 2018 with documented occlusion of the distal circumflex  associated with collaterals.  He underwent subsequent DES to the proximal LAD and angioplasty with stent intervention to the first obtuse marginal and mid circumflex in June 2020.  Most recently underwent cardiac catheterization in April 2024 in the setting of unstable angina and underwent atherectomy to the proximal LAD for treatment of in-stent restenosis.  He is clinically stable at this time, no active angina or interval nitroglycerin  use.  Continue aspirin  81 mg daily, decrease Brilinta  to 60 mg twice daily for longer term use.  Also on Lipitor  80 mg daily and as needed nitroglycerin .   2.  AVNRT status post radiofrequency ablation by Dr. Carolynne Citron in July 2022.  No palpitations.   3.  Mixed hyperlipidemia, LDL 73 in October 2024.  Continues on Lipitor .   4.  Primary hypertension.  Continue lisinopril  40 mg daily.  5.  Iron deficiency anemia, workup ongoing with Dr. Alita Irwin.  He is pending EGD next week.  May hold Brilinta  5 days prior and if needed may hold aspirin  7 days prior.  If he ultimately is found to have GI source of bleeding that is likely to recur such as small bowel AVMs, we would most likely stop his longer-term Brilinta  and leave him on aspirin  if possible.  Disposition:  Follow up  6 months.  Signed, Gerard Knight, M.D., F.A.C.C. [facility] at [facility]

## 2024-02-11 ENCOUNTER — Inpatient Hospital Stay

## 2024-02-11 VITALS — BP 129/70 | HR 70 | Temp 97.5°F | Resp 18

## 2024-02-11 DIAGNOSIS — D509 Iron deficiency anemia, unspecified: Secondary | ICD-10-CM

## 2024-02-11 MED ORDER — CETIRIZINE HCL 10 MG PO TABS
10.0000 mg | ORAL_TABLET | Freq: Once | ORAL | Status: AC
  Administered 2024-02-11: 10 mg via ORAL
  Filled 2024-02-11: qty 1

## 2024-02-11 MED ORDER — ACETAMINOPHEN 325 MG PO TABS
650.0000 mg | ORAL_TABLET | Freq: Once | ORAL | Status: AC
  Administered 2024-02-11: 650 mg via ORAL
  Filled 2024-02-11: qty 2

## 2024-02-11 MED ORDER — IRON SUCROSE 500 MG IVPB - SIMPLE MED
500.0000 mg | Freq: Once | INTRAVENOUS | Status: AC
  Administered 2024-02-11: 500 mg via INTRAVENOUS
  Filled 2024-02-11: qty 500

## 2024-02-11 MED ORDER — SODIUM CHLORIDE 0.9 % IV SOLN: INTRAVENOUS | Status: DC

## 2024-02-11 NOTE — Progress Notes (Signed)
 Patient presents today for Venofer infusion per providers order.  Vital signs within parameters.  Patient has no new complaints at this time.    Peripheral IV started and blood return noted pre and post infusion.  Stable during infusion without adverse affects.  Vital signs stable.  No complaints at this time.  Discharge from clinic ambulatory in stable condition.  Alert and oriented X 3.  Follow up with [facility] as scheduled.

## 2024-02-16 ENCOUNTER — Telehealth (INDEPENDENT_AMBULATORY_CARE_PROVIDER_SITE_OTHER): Payer: Self-pay | Admitting: Gastroenterology

## 2024-02-16 NOTE — Telephone Encounter (Signed)
 Patient came by front office asking about instruction of his procedure for tomorrow and said he didn't stop taking his iron . I told him Bertell Broach would call him when she gets in. [phone]

## 2024-02-16 NOTE — Telephone Encounter (Signed)
 Pt contacted. Pt states he forgot to stop his iron . Pt advised we would need to reschedule. Pt rescheduled to 03/07/24. Pt states he would like to wait on EGD because he knows he will need a TCS also and wants to do both at the same time. Pt did not want to schedule TCS/EGD at this time due to work.  Pt has 2 iron  infusions coming up. New instructions will be sent to pt.

## 2024-02-19 ENCOUNTER — Inpatient Hospital Stay

## 2024-02-19 VITALS — BP 135/62 | HR 61 | Temp 97.2°F | Resp 18

## 2024-02-19 DIAGNOSIS — D509 Iron deficiency anemia, unspecified: Secondary | ICD-10-CM

## 2024-02-19 MED ORDER — CETIRIZINE HCL 10 MG PO TABS
10.0000 mg | ORAL_TABLET | Freq: Once | ORAL | Status: AC
  Administered 2024-02-19: 10 mg via ORAL
  Filled 2024-02-19: qty 1

## 2024-02-19 MED ORDER — ACETAMINOPHEN 325 MG PO TABS
650.0000 mg | ORAL_TABLET | Freq: Once | ORAL | Status: AC
Start: 2024-02-19 — End: 2024-02-19
  Administered 2024-02-19: 650 mg via ORAL
  Filled 2024-02-19: qty 2

## 2024-02-19 MED ORDER — SODIUM CHLORIDE 0.9 % IV SOLN
INTRAVENOUS | Status: DC
Start: 2024-02-19 — End: 2024-02-19

## 2024-02-19 MED ORDER — IRON SUCROSE 500 MG IVPB - SIMPLE MED
500.0000 mg | Freq: Once | INTRAVENOUS | Status: AC
  Administered 2024-02-19: 500 mg via INTRAVENOUS
  Filled 2024-02-19: qty 500

## 2024-02-19 NOTE — Progress Notes (Signed)
Patient presents today for iron infusion. Patient is in satisfactory condition with no new complaints voiced.  Vital signs are stable.  We will proceed with infusion per provider orders.    Peripheral IV started with good blood return pre and post infusion.  Venofer 500 mg given today per MD orders. Tolerated infusion without adverse affects. Vital signs stable. No complaints at this time. Discharged from clinic ambulatory in stable condition. Alert and oriented x 3. F/U with [facility] as scheduled.   

## 2024-02-19 NOTE — Patient Instructions (Signed)
 [facility] - A DEPT OF [place]  Discharge Instructions: Thank you for choosing [facility] to provide your oncology and hematology care.  If you have a lab appointment with the Cancer Center - please note that after April 8th, 2024, all labs will be drawn in the cancer center.  You do not have to check in or register with the main entrance as you have in the past but will complete your check-in in the cancer center.  Wear comfortable clothing and clothing appropriate for easy access to any Portacath or PICC line.   We strive to give you quality time with your provider. You may need to reschedule your appointment if you arrive late (15 or more minutes).  Arriving late affects you and other patients whose appointments are after yours.  Also, if you miss three or more appointments without notifying the office, you may be dismissed from the clinic at the provider's discretion.      For prescription refill requests, have your pharmacy contact our office and allow 72 hours for refills to be completed.    Today you received Venofer  500 mg IV iron  infusion     BELOW ARE SYMPTOMS THAT SHOULD BE REPORTED IMMEDIATELY: *FEVER GREATER THAN 100.4 F (38 C) OR HIGHER *CHILLS OR SWEATING *NAUSEA AND VOMITING THAT IS NOT CONTROLLED WITH YOUR NAUSEA MEDICATION *UNUSUAL SHORTNESS OF BREATH *UNUSUAL BRUISING OR BLEEDING *URINARY PROBLEMS (pain or burning when urinating, or frequent urination) *BOWEL PROBLEMS (unusual diarrhea, constipation, pain near the anus) TENDERNESS IN MOUTH AND THROAT WITH OR WITHOUT PRESENCE OF ULCERS (sore throat, sores in mouth, or a toothache) UNUSUAL RASH, SWELLING OR PAIN  UNUSUAL VAGINAL DISCHARGE OR ITCHING   Items with * indicate a potential emergency and should be followed up as soon as possible or go to the Emergency Department if any problems should occur.  Please show the CHEMOTHERAPY ALERT CARD or IMMUNOTHERAPY ALERT  CARD at check-in to the Emergency Department and triage nurse.  Should you have questions after your visit or need to cancel or reschedule your appointment, please contact [facility] [place] - A DEPT OF [facility] [phone]  and follow the prompts.  Office hours are 8:00 a.m. to 4:30 p.m. Monday - Friday. Please note that voicemails left after 4:00 p.m. may not be returned until the following business day.  We are closed weekends and major holidays. You have access to a nurse at all times for urgent questions. Please call the main number to the clinic [phone] and follow the prompts.  For any non-urgent questions, you may also contact your provider using MyChart. We now offer e-Visits for anyone 58 and older to request care online for non-urgent symptoms. For details visit [email or web address].   Also download the MyChart app! Go to the app store, search "MyChart", open the app, select [place], and log in with your MyChart username and password.

## 2024-02-24 ENCOUNTER — Other Ambulatory Visit: Payer: Self-pay | Admitting: "Endocrinology

## 2024-02-24 ENCOUNTER — Other Ambulatory Visit (INDEPENDENT_AMBULATORY_CARE_PROVIDER_SITE_OTHER): Payer: Self-pay | Admitting: Gastroenterology

## 2024-02-24 ENCOUNTER — Other Ambulatory Visit: Payer: Self-pay | Admitting: Cardiology

## 2024-02-24 DIAGNOSIS — E1165 Type 2 diabetes mellitus with hyperglycemia: Secondary | ICD-10-CM

## 2024-03-07 ENCOUNTER — Ambulatory Visit (HOSPITAL_COMMUNITY)
Admission: RE | Admit: 2024-03-07 | Discharge: 2024-03-07 | Disposition: A | Discharge status: Home / Self Care | Attending: Gastroenterology | Admitting: Gastroenterology

## 2024-03-07 ENCOUNTER — Encounter (HOSPITAL_COMMUNITY)
Admission: RE | Disposition: A | Discharge status: Home / Self Care | Payer: Self-pay | Source: Home / Self Care | Attending: Gastroenterology

## 2024-03-07 DIAGNOSIS — D509 Iron deficiency anemia, unspecified: Secondary | ICD-10-CM | POA: Diagnosis present

## 2024-03-07 DIAGNOSIS — I2510 Atherosclerotic heart disease of native coronary artery without angina pectoris: Secondary | ICD-10-CM | POA: Diagnosis not present

## 2024-03-07 DIAGNOSIS — E785 Hyperlipidemia, unspecified: Secondary | ICD-10-CM | POA: Diagnosis not present

## 2024-03-07 DIAGNOSIS — Z7902 Long term (current) use of antithrombotics/antiplatelets: Secondary | ICD-10-CM | POA: Insufficient documentation

## 2024-03-07 DIAGNOSIS — I10 Essential (primary) hypertension: Secondary | ICD-10-CM | POA: Insufficient documentation

## 2024-03-07 DIAGNOSIS — E119 Type 2 diabetes mellitus without complications: Secondary | ICD-10-CM | POA: Insufficient documentation

## 2024-03-07 DIAGNOSIS — Z7901 Long term (current) use of anticoagulants: Secondary | ICD-10-CM | POA: Insufficient documentation

## 2024-03-07 DIAGNOSIS — K219 Gastro-esophageal reflux disease without esophagitis: Secondary | ICD-10-CM | POA: Insufficient documentation

## 2024-03-07 DIAGNOSIS — I252 Old myocardial infarction: Secondary | ICD-10-CM | POA: Diagnosis not present

## 2024-03-07 DIAGNOSIS — K317 Polyp of stomach and duodenum: Secondary | ICD-10-CM

## 2024-03-07 HISTORY — PX: GIVENS CAPSULE STUDY: SHX5432

## 2024-03-07 SURGERY — IMAGING PROCEDURE, GI TRACT, INTRALUMINAL, VIA CAPSULE
Anesthesia: Choice

## 2024-03-07 NOTE — H&P (Signed)
 Lance [name] is a 48 y.o. male with history of CAD, MI, PCI with DES on dual antiplatelet therapy, LVH, grade 1 diastolic dysfunction, type 2 diabetes, HTN, HLD, GERD, IDA,  who presents for evaluation of severe iron  deficiency anemia. Here for capsule endoscopy     Last blood work ferritin 3 iron  saturation 30 TIBC 454 Hemoglobin 8.4 MCV 75   EGD 01/2024   - Normal esophagus. - Gastritis. - Multiple gastric polyps. - Normal duodenal bulb, first portion of the duodenum and second portion of the duodenum. - No specimens collected.   colonoscopy 08/06/2023: Three 5 to 6 mm polyps in the ascending colon resected and retrieved, one 6 mm polyp in the transverse colon resected and retrieved with MR conditional clip placed.  Nonbleeding internal and external hemorrhoids.  Recommended repeat colonoscopy in 1 year for surveillance given fair prep.  Will need extended bowel prep.  Pathology with tubular adenomas.   EGD 08/06/2023: Normal esophagus, erythematous mucosa in the stomach biopsied, multiple gastric polyps biopsied, normal examined duodenum.  Pathology showed mild nonspecific reactive gastropathy, no H. pylori, hyperplastic gastric polyp.  Recommended repeat EGD in 1 year.     A. STOMACH, BIOPSY:  - Gastric antral and oxyntic mucosa with mild nonspecific reactive  gastropathy  - Helicobacter pylori-like organisms are not identified on routine HE  stain   B. STOMACH POLYP, BIOPSY:  - Gastric hyperplastic polyp  - Negative for intestinal metaplasia or dysplasia   C. COLON, ASCENDING, TRANSVERSE, POLYPECTOMY:  - Tubular adenoma(s)  - Negative for high-grade dysplasia or malignancy    Patient had 2 upper endoscopies only found to have nonbleeding gastric hyperplastic polyp not removed as patient is on dual antiplatelet and colonoscopy done last year with subcentimeter tubular adenoma   ===========  Capsule endoscopy to evaluate small bowel for any lesion explaining iron  deficiency     It has been 1 year since his last cardiac cath would ideally off Brilinta  x 5 days to remove all of his gastric polyps.  Continue aspirin  regardless

## 2024-03-08 ENCOUNTER — Encounter (HOSPITAL_COMMUNITY): Payer: Self-pay | Admitting: Gastroenterology

## 2024-03-10 ENCOUNTER — Inpatient Hospital Stay: Attending: Hematology

## 2024-03-10 DIAGNOSIS — D509 Iron deficiency anemia, unspecified: Secondary | ICD-10-CM | POA: Insufficient documentation

## 2024-03-14 ENCOUNTER — Inpatient Hospital Stay

## 2024-03-14 DIAGNOSIS — D500 Iron deficiency anemia secondary to blood loss (chronic): Secondary | ICD-10-CM

## 2024-03-14 DIAGNOSIS — D509 Iron deficiency anemia, unspecified: Secondary | ICD-10-CM | POA: Diagnosis present

## 2024-03-14 LAB — CBC WITH DIFFERENTIAL/PLATELET
Abs Immature Granulocytes: 0.03 10*3/uL (ref 0.00–0.07)
Basophils Absolute: 0.1 10*3/uL (ref 0.0–0.1)
Basophils Relative: 1 %
Eosinophils Absolute: 0.5 10*3/uL (ref 0.0–0.5)
Eosinophils Relative: 5 %
HCT: 41.2 % (ref 39.0–52.0)
Hemoglobin: 12.4 g/dL — ABNORMAL LOW (ref 13.0–17.0)
Immature Granulocytes: 0 %
Lymphocytes Relative: 29 %
Lymphs Abs: 2.8 10*3/uL (ref 0.7–4.0)
MCH: 25 pg — ABNORMAL LOW (ref 26.0–34.0)
MCHC: 30.1 g/dL (ref 30.0–36.0)
MCV: 83.1 fL (ref 80.0–100.0)
Monocytes Absolute: 0.9 10*3/uL (ref 0.1–1.0)
Monocytes Relative: 9 %
Neutro Abs: 5.6 10*3/uL (ref 1.7–7.7)
Neutrophils Relative %: 56 %
Platelets: 235 10*3/uL (ref 150–400)
RBC: 4.96 MIL/uL (ref 4.22–5.81)
RDW: 21.6 % — ABNORMAL HIGH (ref 11.5–15.5)
WBC: 9.9 10*3/uL (ref 4.0–10.5)
nRBC: 0 % (ref 0.0–0.2)

## 2024-03-14 LAB — IRON AND TIBC
Iron: 39 ug/dL — ABNORMAL LOW (ref 45–182)
Saturation Ratios: 10 % — ABNORMAL LOW (ref 17.9–39.5)
TIBC: 402 ug/dL (ref 250–450)
UIBC: 363 ug/dL

## 2024-03-14 LAB — COMPREHENSIVE METABOLIC PANEL WITH GFR
ALT: 27 U/L (ref 0–44)
AST: 25 U/L (ref 15–41)
Albumin: 3.8 g/dL (ref 3.5–5.0)
Alkaline Phosphatase: 89 U/L (ref 38–126)
Anion gap: 10 (ref 5–15)
BUN: 10 mg/dL (ref 6–20)
CO2: 24 mmol/L (ref 22–32)
Calcium: 9.1 mg/dL (ref 8.9–10.3)
Chloride: 105 mmol/L (ref 98–111)
Creatinine, Ser: 0.93 mg/dL (ref 0.61–1.24)
GFR, Estimated: >60 mL/min (ref >60)
Glucose, Bld: 113 mg/dL — ABNORMAL HIGH (ref 70–99)
Potassium: 4.2 mmol/L (ref 3.5–5.1)
Sodium: 139 mmol/L (ref 135–145)
Total Bilirubin: 0.7 mg/dL (ref 0.0–1.2)
Total Protein: 6.8 g/dL (ref 6.5–8.1)

## 2024-03-14 LAB — VITAMIN B12: Vitamin B-12: 382 pg/mL (ref 180–914)

## 2024-03-14 LAB — FOLATE: Folate: 14.2 ng/mL (ref >5.9)

## 2024-03-14 LAB — FERRITIN: Ferritin: 24 ng/mL (ref 24–336)

## 2024-03-16 ENCOUNTER — Other Ambulatory Visit: Payer: Self-pay | Admitting: "Endocrinology

## 2024-03-16 DIAGNOSIS — E1159 Type 2 diabetes mellitus with other circulatory complications: Secondary | ICD-10-CM

## 2024-03-17 ENCOUNTER — Inpatient Hospital Stay: Admitting: Oncology

## 2024-03-17 VITALS — BP 135/66 | HR 88 | Temp 98.2°F | Resp 18 | Ht 71.0 in | Wt 288.1 lb

## 2024-03-17 DIAGNOSIS — D509 Iron deficiency anemia, unspecified: Secondary | ICD-10-CM | POA: Diagnosis not present

## 2024-03-17 DIAGNOSIS — D500 Iron deficiency anemia secondary to blood loss (chronic): Secondary | ICD-10-CM

## 2024-03-17 NOTE — Progress Notes (Signed)
 [facility] at [facility]  HEMATOLOGY FOLLOW-UP VISIT  Lance Jarred, PA-C  REASON FOR FOLLOW-UP: Iron  deficiency anemia  ASSESSMENT & PLAN:  Patient is a 48 y.o. male following for iron  deficiency anemia  Iron  deficiency anemia The most likely cause of his anemia is due to chronic blood loss   Last colonoscopy/endoscopy:  Endoscopy 01/25/2024: Gastric polyps, no active bleeding Colonoscopy: 08/06/2023: No active bleeding Capsule endoscopy results: Pending at this time Status post IV iron -Venofer  500 mg X2 doses.  Patient has significant improvement in iron  levels and hemoglobin at this time but does have as he is at a level of 10 and anemia  -Will administer 2 more doses of IV Venofer  500 mg.  Reemphasized adverse effects including allergic reactions, nausea, headache. -Continue oral iron .  Use MiraLAX  for constipation - Continue to follow with GI.  Will await capsule endoscopy results  Return to clinic in 2 months with labs    Orders Placed This Encounter  Procedures   Ferritin    Standing Status:   Future    Expected Date:   05/16/2024    Expiration Date:   08/14/2024   Folate    Standing Status:   Future    Expected Date:   05/16/2024    Expiration Date:   08/14/2024   Vitamin B12    Standing Status:   Future    Expected Date:   05/16/2024    Expiration Date:   08/14/2024   CBC with Differential/Platelet    Standing Status:   Future    Expected Date:   05/16/2024    Expiration Date:   08/14/2024   Comprehensive metabolic panel with GFR    Standing Status:   Future    Expected Date:   05/16/2024    Expiration Date:   08/14/2024   Iron  and TIBC    Standing Status:   Future    Expected Date:   05/16/2024    Expiration Date:   08/14/2024    The total time spent in the appointment was 20 minutes encounter with patients including review of chart and various tests results, discussions about plan of care and coordination of care plan   All questions  were answered. The patient knows to call the clinic with any problems, questions or concerns. No barriers to learning was detected.  Lance Grade, MD 6/12/20251:31 PM   SUMMARY OF HEMATOLOGIC HISTORY: Iron  deficiency anemia likely secondary to GI bleeding - S/p IV iron  Venofer  500 mg x 2 doses on 02/11/2024 and 02/19/2024   INTERVAL HISTORY: Lance [name] 48 y.o. male following for iron  deficiency anemia.  Patient stated that he had significant improvement since the IV iron  infusion and his energy levels.  He underwent capsule endoscopy a week ago but does not know of the results yet.  He has no other complaints today and is feeling much better.  I have reviewed the past medical history, past surgical history, social history and family history with the patient   ALLERGIES:  is allergic to bee venom and penicillins.  MEDICATIONS:  Current Outpatient Medications  Medication Sig Dispense Refill   aspirin  EC 81 MG tablet Take 1 tablet (81 mg total) by mouth daily with breakfast. 120 tablet 5   atorvastatin  (LIPITOR ) 80 MG tablet TAKE 1 TABLET BY MOUTH EVERYDAY AT BEDTIME 90 tablet 1   B-D ULTRAFINE III SHORT PEN 31G X 8 MM MISC USE TO INJECT INSULIN  4 (FOUR) TIMES DAILY. 400 each 2  blood glucose meter kit and supplies KIT 1 each by Does not apply route 4 (four) times daily. Dispense based on patient and insurance preference. Use up to four times daily as directed. (FOR ICD-9 250.00, 250.01). 1 each 0   Continuous Glucose Sensor (DEXCOM G7 SENSOR) MISC CHANGE SENSOR EVERY 10 DAYS 9 each 0   docusate sodium  (COLACE) 100 MG capsule Take 1 capsule (100 mg total) by mouth 2 (two) times daily. 60 capsule 2   famotidine  (PEPCID ) 40 MG tablet Take 40 mg by mouth daily.     ferrous sulfate  325 (65 FE) MG EC tablet Take 1 tablet (325 mg total) by mouth 2 (two) times daily. 60 tablet 3   insulin  aspart (NOVOLOG  FLEXPEN) 100 UNIT/ML FlexPen INJECT 20-26 UNITS INTO THE SKIN 3 (THREE) TIMES DAILY WITH  MEALS. 75 mL 0   insulin  glargine-yfgn (SEMGLEE ) 100 UNIT/ML Pen SMARTSIG:80 Unit(s) SUB-Q Every Night     insulin  glargine-yfgn (SEMGLEE ) 100 UNIT/ML Pen INJECT 80 UNITS INTO THE SKIN AT BEDTIME. 75 mL 0   lisinopril  (ZESTRIL ) 40 MG tablet TAKE 1 TABLET BY MOUTH EVERY DAY 90 tablet 1   nitroGLYCERIN  (NITROSTAT ) 0.4 MG SL tablet Place 1 tablet (0.4 mg total) under the tongue every 5 (five) minutes x 3 doses as needed for chest pain. 25 tablet 3   OZEMPIC , 0.25 OR 0.5 MG/DOSE, 2 MG/3ML SOPN Inject 1 mg into the skin once a week.     pantoprazole  (PROTONIX ) 40 MG tablet Take 40 mg by mouth at bedtime.     ticagrelor  (BRILINTA ) 60 MG TABS tablet Take 1 tablet (60 mg total) by mouth 2 (two) times daily. 60 tablet 11   No current facility-administered medications for this visit.     REVIEW OF SYSTEMS:   Constitutional: Denies fevers, chills or night sweats Eyes: Denies blurriness of vision Ears, nose, mouth, throat, and face: Denies mucositis or sore throat Respiratory: Denies cough, dyspnea or wheezes Cardiovascular: Denies palpitation, chest discomfort or lower extremity swelling Gastrointestinal:  Denies nausea, heartburn or change in bowel habits Skin: Denies abnormal skin rashes Lymphatics: Denies new lymphadenopathy or easy bruising Neurological:Denies numbness, tingling or new weaknesses Behavioral/Psych: Mood is stable, no new changes  All other systems were reviewed with the patient and are negative.  PHYSICAL EXAMINATION:   Vitals:   03/17/24 1308  BP: 135/66  Pulse: 88  Resp: 18  Temp: 98.2 F (36.8 C)  SpO2: 98%    GENERAL:alert, no distress and comfortable SKIN: skin color, texture, turgor are normal, no rashes or significant lesions LUNGS: clear to auscultation and percussion with normal breathing effort HEART: regular rate & rhythm and no murmurs and no lower extremity edema ABDOMEN:abdomen soft, non-tender and normal bowel sounds Musculoskeletal:no cyanosis of  digits and no clubbing  NEURO: alert & oriented x 3 with fluent speech  LABORATORY DATA:  I have reviewed the data as listed  Lab Results  Component Value Date   WBC 9.9 03/14/2024   NEUTROABS 5.6 03/14/2024   HGB 12.4 (L) 03/14/2024   HCT 41.2 03/14/2024   MCV 83.1 03/14/2024   PLT 235 03/14/2024      Chemistry      Component Value Date/Time   NA 139 03/14/2024 1245   NA 139 09/22/2022 0756   K 4.2 03/14/2024 1245   CL 105 03/14/2024 1245   CO2 24 03/14/2024 1245   BUN 10 03/14/2024 1245   BUN 16 09/22/2022 0756   CREATININE 0.93 03/14/2024 1245  CREATININE 0.91 04/30/2020 0728      Component Value Date/Time   CALCIUM  9.1 03/14/2024 1245   ALKPHOS 89 03/14/2024 1245   AST 25 03/14/2024 1245   ALT 27 03/14/2024 1245   BILITOT 0.7 03/14/2024 1245   BILITOT 0.6 09/22/2022 0756      Latest Reference Range & Units 03/14/24 12:45  Iron  45 - 182 ug/dL 39 (L)  UIBC ug/dL 161  TIBC 096 - 045 ug/dL 409  Saturation Ratios 17.9 - 39.5 % 10 (L)  Ferritin 24 - 336 ng/mL 24  Folate >5.9 ng/mL 14.2  Vitamin B12 180 - 914 pg/mL 382  (L): Data is abnormally low  Upper endoscopy: 01/25/2024: Impression:  - Normal esophagus.  - Gastritis.  - Multiple gastric polyps.  - Normal duodenal bulb, first portion of the duodenum and second portion of the duodenum.  - No specimens collected.   Colonoscopy: 08/06/2023: Impression:  - Three 5 to 6 mm polyps in the ascending colon, removed with a cold snare. Resected and retrieved. - One 6 mm polyp in the transverse colon, removed with a cold snare. Resected and retrieved. Clip ( MR conditional) was placed. Clip manufacturer: [employer]. - Non- bleeding external and internal hemorrhoids.   Pathology: FINAL MICROSCOPIC DIAGNOSIS:   A. STOMACH, BIOPSY:  - Gastric antral and oxyntic mucosa with mild nonspecific reactive  gastropathy  - Helicobacter pylori-like organisms are not identified on routine HE  stain   B. STOMACH  POLYP, BIOPSY:  - Gastric hyperplastic polyp  - Negative for intestinal metaplasia or dysplasia   C. COLON, ASCENDING, TRANSVERSE, POLYPECTOMY:  - Tubular adenoma(s)  - Negative for high-[name] dysplasia or malignancy   RADIOGRAPHIC STUDIES: I have personally reviewed the radiological images as listed and agreed with the findings in the report.  None to review

## 2024-03-17 NOTE — Assessment & Plan Note (Signed)
 The most likely cause of his anemia is due to chronic blood loss   Last colonoscopy/endoscopy:  Endoscopy 01/25/2024: Gastric polyps, no active bleeding Colonoscopy: 08/06/2023: No active bleeding Capsule endoscopy results: Pending at this time Status post IV iron -Venofer  500 mg X2 doses.  Patient has significant improvement in iron  levels and hemoglobin at this time but does have as he is at a level of 10 and anemia  -Will administer 2 more doses of IV Venofer  500 mg.  Reemphasized adverse effects including allergic reactions, nausea, headache. -Continue oral iron .  Use MiraLAX  for constipation - Continue to follow with GI.  Will await capsule endoscopy results  Return to clinic in 2 months with labs

## 2024-03-17 NOTE — Patient Instructions (Signed)
 VISIT SUMMARY:  Today, you came in for a follow-up regarding your iron  deficiency treatment. We discussed your recent improvements in energy levels and lab results, as well as your concerns about potential bleeding due to your blood thinner medication.  YOUR PLAN:  -IRON  DEFICIENCY ANEMIA: Iron  deficiency anemia is a condition where your body lacks enough iron  to produce healthy red blood cells. Your hemoglobin and iron  levels have improved significantly, but your iron  levels are still not optimal. We will administer two more doses of iron  infusion and you should continue taking your daily iron  pills as tolerated. We will review the results of your recent capsule endoscopy once they are available.  INSTRUCTIONS:  Please schedule a follow-up appointment in two months. We will also review your capsule endoscopy results when they become available.

## 2024-03-22 ENCOUNTER — Inpatient Hospital Stay

## 2024-03-22 VITALS — BP 131/49 | HR 58 | Temp 97.5°F | Resp 16 | Wt 287.2 lb

## 2024-03-22 DIAGNOSIS — D509 Iron deficiency anemia, unspecified: Secondary | ICD-10-CM

## 2024-03-22 MED ORDER — SODIUM CHLORIDE 0.9 % IV SOLN: Freq: Once | INTRAVENOUS | Status: AC

## 2024-03-22 MED ORDER — ACETAMINOPHEN 325 MG PO TABS
650.0000 mg | ORAL_TABLET | Freq: Once | ORAL | Status: AC
  Administered 2024-03-22: 650 mg via ORAL
  Filled 2024-03-22: qty 2

## 2024-03-22 MED ORDER — CETIRIZINE HCL 10 MG PO TABS
10.0000 mg | ORAL_TABLET | Freq: Once | ORAL | Status: AC
  Administered 2024-03-22: 10 mg via ORAL
  Filled 2024-03-22: qty 1

## 2024-03-22 MED ORDER — IRON SUCROSE 500 MG IVPB - SIMPLE MED
500.0000 mg | Freq: Once | INTRAVENOUS | Status: AC
  Administered 2024-03-22: 500 mg via INTRAVENOUS
  Filled 2024-03-22: qty 500

## 2024-03-22 NOTE — Progress Notes (Signed)
Venofer given today per MD orders. Tolerated infusion without adverse affects. Vital signs stable. No complaints at this time. Discharged from clinic ambulatory in stable condition. Alert and oriented x 3. F/U with [facility] as scheduled.

## 2024-03-22 NOTE — Patient Instructions (Signed)

## 2024-03-24 ENCOUNTER — Telehealth (INDEPENDENT_AMBULATORY_CARE_PROVIDER_SITE_OTHER): Payer: Self-pay | Admitting: Gastroenterology

## 2024-03-24 NOTE — Telephone Encounter (Signed)
 Pt states he has not heard anything from camera study. Pt had capsule study completed on 03/07/24. Please advise. Thank you.

## 2024-03-27 NOTE — Procedures (Signed)
 Small Bowel [place] Capsule Study Procedure date: 03/2024  Indication for procedure:  Iron  deficiency anemia   Findings:  Study was inadequate as capsule did not reached the cecum and the bowel preparation was poor in the small bowel.   Atleast 3 gastric polyp and a single erosion seen in the stomach Red spot in the small bowel No active or old bleeding seen       Summary & Recommendations:  Lance [name] is a 48 y.o. male with history of CAD, MI, PCI with DES on dual antiplatelet therapy, LVH, grade 1 diastolic dysfunction, type 2 diabetes, HTN, HLD, GERD, IDA,  who had SBE evaluation of severe iron  deficiency anemia.   Findings :   Atleast 3 gastric polyp and a single erosion seen in the stomach Red spot in the small bowel No active or old bleeding seen   Patient had 2 upper endoscopies only found to have nonbleeding gastric hyperplastic polyp not removed as patient is on dual antiplatelet and colonoscopy done last year with subcentimeter tubular adenoma   Recs:   Patient HBG has significantly improved now (8--->12.4) with improvement of IDA (Ferritin : 3--->24)  , continue to follow up with hematology  It has been 1 year since his last cardiac cath would ideally off Brilinta  x 5 days to remove all of his gastric polyps, along with colonoscopy ( extended bowel prep )   Continue aspirin  regardless   If above is negative and patient continues to be IDA , would recommend repeat SBE in future   Patient to continue follow up in Gi clinic   I attempted to call patient few times today to communicate these recommendations to the patient but did not pick up . HIPAA complaint VM was left .   Beatriz Quintela Faizan Tiegan Jambor , MD Gastroenterology and Hepatology [facility]

## 2024-03-27 NOTE — Telephone Encounter (Signed)
 I tried to call patient few times today . Can you please let patient know and ask him to see us  in the clinic   Hi Lance [name],  Can you please schedule a follow up appointment for this patient in 4-6 weeks  with me or any of the APPs?  Thanks,  Bob Eastwood Lance [name] , MD Gastroenterology and Hepatology [facility]

## 2024-03-28 NOTE — Telephone Encounter (Signed)
 Pt contacted. Pt made aware that our front desk will be calling to get him set up for office visit.

## 2024-03-29 ENCOUNTER — Inpatient Hospital Stay

## 2024-04-14 ENCOUNTER — Encounter: Payer: Self-pay | Admitting: "Endocrinology

## 2024-04-14 ENCOUNTER — Ambulatory Visit: Admitting: "Endocrinology

## 2024-04-14 VITALS — BP 130/66 | HR 72 | Ht 71.0 in | Wt 291.4 lb

## 2024-04-14 DIAGNOSIS — E782 Mixed hyperlipidemia: Secondary | ICD-10-CM

## 2024-04-14 DIAGNOSIS — Z794 Long term (current) use of insulin: Secondary | ICD-10-CM | POA: Diagnosis not present

## 2024-04-14 DIAGNOSIS — I10 Essential (primary) hypertension: Secondary | ICD-10-CM

## 2024-04-14 DIAGNOSIS — E1159 Type 2 diabetes mellitus with other circulatory complications: Secondary | ICD-10-CM

## 2024-04-14 LAB — POCT GLYCOSYLATED HEMOGLOBIN (HGB A1C): HbA1c, POC (controlled diabetic range): 7.1 % — AB (ref 0.0–7.0)

## 2024-04-14 NOTE — Patient Instructions (Signed)

## 2024-04-14 NOTE — Progress Notes (Signed)
 04/14/2024  Endocrinology follow-up note  Subjective:    Patient ID: Lance [name], male   DOB: 1976/07/26.  Lance [name] is being seen in follow-up for management of currently uncontrolled type 2 diabetes, complicated by coronary artery disease recently which required stent placement, hyperlipidemia, hypertension.   Past Medical History:  Diagnosis Date   CAD (coronary artery disease), native coronary artery    10/18 PCI/DES to mRCA, and OM, with total occlusion of dLCx with collaterals.    Essential hypertension    GERD (gastroesophageal reflux disease)    History of kidney stones    Hyperlipemia    MI (myocardial infarction) (HCC) 2020   SVT (supraventricular tachycardia) (HCC) 2015   Converted with adenosine    Type 2 diabetes mellitus (HCC)    Past Surgical History:  Procedure Laterality Date   BIOPSY  08/06/2023   Procedure: BIOPSY;  Surgeon: Cinderella Deatrice FALCON, MD;  Location: [facility];  Service: Endoscopy;;   CARDIAC SURGERY     COLONOSCOPY WITH PROPOFOL  N/A 08/06/2023   Procedure: COLONOSCOPY WITH PROPOFOL ;  Surgeon: Cinderella Deatrice FALCON, MD;  Location: [facility];  Service: Endoscopy;  Laterality: N/A;   CORONARY ANGIOPLASTY WITH STENT PLACEMENT  07/15/2017   2 stents   CORONARY BALLOON ANGIOPLASTY N/A 01/16/2023   Procedure: CORONARY BALLOON ANGIOPLASTY;  Surgeon: Burnard Debby LABOR, MD;  Location: [facility];  Service: Cardiovascular;  Laterality: N/A;   CORONARY/GRAFT ACUTE MI REVASCULARIZATION N/A 03/12/2019   Procedure: Coronary/Graft Acute MI Revascularization;  Surgeon: Mady Bruckner, MD;  Location: [facility];  Service: Cardiovascular;  Laterality: N/A;   ESOPHAGOGASTRODUODENOSCOPY N/A 01/25/2024   Procedure: EGD (ESOPHAGOGASTRODUODENOSCOPY);  Surgeon: Cindie Carlin POUR, DO;  Location: [facility];  Service: Endoscopy;  Laterality: N/A;   ESOPHAGOGASTRODUODENOSCOPY (EGD) WITH PROPOFOL  N/A 08/06/2023   Procedure: ESOPHAGOGASTRODUODENOSCOPY (EGD)  WITH PROPOFOL ;  Surgeon: Cinderella Deatrice FALCON, MD;  Location: [facility];  Service: Endoscopy;  Laterality: N/A;  1:30pm;asa 3   GIVENS CAPSULE STUDY N/A 03/07/2024   Procedure: IMAGING PROCEDURE, GI TRACT, INTRALUMINAL, VIA CAPSULE;  Surgeon: Cinderella Deatrice FALCON, MD;  Location: [facility];  Service: Endoscopy;  Laterality: N/A;  7:30AM;GIVENS   HEMOSTASIS CLIP PLACEMENT  08/06/2023   Procedure: HEMOSTASIS CLIP PLACEMENT;  Surgeon: Cinderella Deatrice FALCON, MD;  [facility]: [facility];  Service: Endoscopy;;   LAPAROSCOPIC APPENDECTOMY N/A 11/12/2017   Procedure: APPENDECTOMY LAPAROSCOPIC, REPAIR OF INCARCERATED INCISIONAL HERNIA;  Surgeon: Sheldon Standing, MD;  Location: [facility];  Service: General;  Laterality: N/A;   LAPAROSCOPIC CHOLECYSTECTOMY     LEFT HEART CATH AND CORONARY ANGIOGRAPHY N/A 07/15/2017   Procedure: LEFT HEART CATH AND CORONARY ANGIOGRAPHY;  Surgeon: Wonda Sharper, MD;  Location: [facility];  Service: Cardiovascular;  Laterality: N/A;   LEFT HEART CATH AND CORONARY ANGIOGRAPHY N/A 03/12/2019   Procedure: LEFT HEART CATH AND CORONARY ANGIOGRAPHY;  Surgeon: Mady Bruckner, MD;  Location: [facility];  Service: Cardiovascular;  Laterality: N/A;   LEFT HEART CATH AND CORONARY ANGIOGRAPHY N/A 01/16/2023   Procedure: LEFT HEART CATH AND CORONARY ANGIOGRAPHY;  Surgeon: Burnard Debby LABOR, MD;  Location: [facility];  Service: Cardiovascular;  Laterality: N/A;   POLYPECTOMY  08/06/2023   Procedure: POLYPECTOMY INTESTINAL;  Surgeon: Cinderella Deatrice FALCON, MD;  [facility]: [facility];  Service: Endoscopy;;   SVT ABLATION N/A 04/22/2021   Procedure: SVT ABLATION;  Surgeon: Waddell Danelle ORN, MD;  Location: [facility];  Service: Cardiovascular;  Laterality:  N/A;   Social History   Socioeconomic History   Marital status: Married    Spouse name: Not on file   Number of children: Not on file   Years of education: Not on file   Highest education level: Not on file   Occupational History   Occupation: Superintendent    Employer: [employer]  Tobacco Use   Smoking status: Former    Current packs/day: 0.00    Average packs/day: 0.3 packs/day for 26.0 years (6.5 ttl pk-yrs)    Types: Cigarettes    Start date: 03/06/1993    Quit date: 03/07/2019    Years since quitting: 5.1    Passive exposure: Never   Smokeless tobacco: Never  Vaping Use   Vaping status: Never Used  Substance and Sexual Activity   Alcohol  use: No   Drug use: No   Sexual activity: Yes    Birth control/protection: None  Other Topics Concern   Not on file  Social History Narrative   Not on file   Social Drivers of Health   Financial Resource Strain: Not on file  Food Insecurity: No Food Insecurity (02/04/2024)   Hunger Vital Sign    Worried About Running Out of Food in the Last Year: Never true    Ran Out of Food in the Last Year: Never true  Transportation Needs: No Transportation Needs (02/04/2024)   PRAPARE - Administrator, Civil Service (Medical): No    Lack of Transportation (Non-Medical): No  Physical Activity: Not on file  Stress: Not on file  Social Connections: Moderately Isolated (01/24/2024)   Social Connection and Isolation Panel    Frequency of Communication with Friends and Family: More than three times a week    Frequency of Social Gatherings with Friends and Family: More than three times a week    Attends Religious Services: Never    Database administrator or Organizations: No    Attends Banker Meetings: Never    Marital Status: Married   Outpatient Encounter Medications as of 04/14/2024  Medication Sig   aspirin  EC 81 MG tablet Take 1 tablet (81 mg total) by mouth daily with breakfast.   atorvastatin  (LIPITOR ) 80 MG tablet TAKE 1 TABLET BY MOUTH EVERYDAY AT BEDTIME   B-D ULTRAFINE III SHORT PEN 31G X 8 MM MISC USE TO INJECT INSULIN  4 (FOUR) TIMES DAILY.   blood glucose meter kit and supplies KIT 1 each by Does not apply route  4 (four) times daily. Dispense based on patient and insurance preference. Use up to four times daily as directed. (FOR ICD-9 250.00, 250.01).   Continuous Glucose Sensor (DEXCOM G7 SENSOR) MISC CHANGE SENSOR EVERY 10 DAYS   docusate sodium  (COLACE) 100 MG capsule Take 1 capsule (100 mg total) by mouth 2 (two) times daily.   famotidine  (PEPCID ) 40 MG tablet Take 40 mg by mouth daily.   ferrous sulfate  325 (65 FE) MG EC tablet Take 1 tablet (325 mg total) by mouth 2 (two) times daily.   insulin  aspart (NOVOLOG  FLEXPEN) 100 UNIT/ML FlexPen INJECT 20-26 UNITS INTO THE SKIN 3 (THREE) TIMES DAILY WITH MEALS.   insulin  glargine-yfgn (SEMGLEE ) 100 UNIT/ML Pen INJECT 80 UNITS INTO THE SKIN AT BEDTIME.   lisinopril  (ZESTRIL ) 40 MG tablet TAKE 1 TABLET BY MOUTH EVERY DAY   nitroGLYCERIN  (NITROSTAT ) 0.4 MG SL tablet Place 1 tablet (0.4 mg total) under the tongue every 5 (five) minutes x 3 doses as needed for chest pain.  OZEMPIC , 0.25 OR 0.5 MG/DOSE, 2 MG/3ML SOPN Inject 1 mg into the skin once a week.   pantoprazole  (PROTONIX ) 40 MG tablet Take 40 mg by mouth at bedtime.   ticagrelor  (BRILINTA ) 60 MG TABS tablet Take 1 tablet (60 mg total) by mouth 2 (two) times daily.   [DISCONTINUED] insulin  glargine-yfgn (SEMGLEE ) 100 UNIT/ML Pen SMARTSIG:80 Unit(s) SUB-Q Every Night   No facility-administered encounter medications on file as of 04/14/2024.   ALLERGIES: Allergies  Allergen Reactions   Bee Venom Anaphylaxis and Swelling   Penicillins Hives, Itching and Rash   VACCINATION STATUS:  There is no immunization history on file for this patient.  Diabetes He presents for his follow-up diabetic visit. He has type 2 diabetes mellitus. Onset time: He was diagnosed at approximate age of 35 years. His disease course has been improving. There are no hypoglycemic associated symptoms. Pertinent negatives for hypoglycemia include no confusion, headaches, pallor or seizures. Pertinent negatives for diabetes include  no blurred vision, no chest pain, no fatigue, no polydipsia, no polyphagia, no polyuria and no weakness. There are no hypoglycemic complications. Symptoms are improving. Diabetic complications include heart disease. Risk factors for coronary artery disease include diabetes mellitus, dyslipidemia, family history, hypertension, male sex, obesity, tobacco exposure and sedentary lifestyle. Current diabetic treatment includes intensive insulin  program. His weight is fluctuating minimally. He is following a generally unhealthy diet. When asked about meal planning, he reported none. He has not had a previous visit with a dietitian. He rarely participates in exercise. His home blood glucose trend is decreasing steadily. His breakfast blood glucose range is generally 140-180 mg/dl. His lunch blood glucose range is generally 140-180 mg/dl. His dinner blood glucose range is generally 140-180 mg/dl. His bedtime blood glucose range is generally 140-180 mg/dl. His overall blood glucose range is 140-180 mg/dl. Lance [name] presents with his CGM device showing improved glycemic profile averaging 162 mg per DL for the last 14 days.  His Dexcom overview shows 70% time in range, 26% level 1 hyperglycemia, 3% level 2 hyperglycemia.  His point-of-care A1c is 7.1%,  progressively improving. He did not document any hypoglycemia. ) An ACE inhibitor/angiotensin II receptor blocker is being taken. He does not see a podiatrist.Eye exam is not current.  Hyperlipidemia This is a chronic problem. The current episode started more than 1 year ago. The problem is controlled. Exacerbating diseases include diabetes and obesity. Pertinent negatives include no chest pain, myalgias or shortness of breath. Current antihyperlipidemic treatment includes statins and bile acid squestrants. The current treatment provides significant improvement of lipids. Risk factors for coronary artery disease include diabetes mellitus, dyslipidemia, hypertension, male sex,  obesity and a sedentary lifestyle.  Hypertension This is a chronic problem. The current episode started more than 1 year ago. Pertinent negatives include no blurred vision, chest pain, headaches, neck pain, palpitations or shortness of breath. Risk factors for coronary artery disease include dyslipidemia, diabetes mellitus, male gender, smoking/tobacco exposure and sedentary lifestyle. Past treatments include ACE inhibitors. Hypertensive end-organ damage includes CAD/MI.   Review of systems  Constitutional: + Minimally fluctuating body weight,  current  Body mass index is 40.64 kg/m. , no fatigue, no subjective hyperthermia, no subjective hypothermia    Objective:    BP 130/66   Pulse 72   Ht 5' 11 (1.803 m)   Wt 291 lb 6.4 oz (132.2 kg)   BMI 40.64 kg/m   Wt Readings from Last 3 Encounters:  04/14/24 291 lb 6.4 oz (132.2 kg)  03/22/24 287 lb  3.2 oz (130.3 kg)  03/17/24 288 lb 2.3 oz (130.7 kg)      Physical Exam- Limited  Constitutional:  Body mass index is 40.64 kg/m. , not in acute distress  CMP ( most recent) CMP     Component Value Date/Time   NA 139 03/14/2024 1245   NA 139 09/22/2022 0756   K 4.2 03/14/2024 1245   CL 105 03/14/2024 1245   CO2 24 03/14/2024 1245   GLUCOSE 113 (H) 03/14/2024 1245   BUN 10 03/14/2024 1245   BUN 16 09/22/2022 0756   CREATININE 0.93 03/14/2024 1245   CREATININE 0.91 04/30/2020 0728   CALCIUM  9.1 03/14/2024 1245   PROT 6.8 03/14/2024 1245   PROT 6.4 09/22/2022 0756   ALBUMIN 3.8 03/14/2024 1245   ALBUMIN 4.1 09/22/2022 0756   AST 25 03/14/2024 1245   ALT 27 03/14/2024 1245   ALKPHOS 89 03/14/2024 1245   BILITOT 0.7 03/14/2024 1245   BILITOT 0.6 09/22/2022 0756   GFRNONAA >60 03/14/2024 1245   GFRNONAA 102 04/30/2020 0728   GFRAA 118 04/30/2020 0728     Diabetic Labs (most recent): Lab Results  Component Value Date   HGBA1C 7.1 (A) 04/14/2024   HGBA1C 7.5 (H) 01/24/2024   HGBA1C 8.3 (A) 12/08/2023   MICROALBUR 1.0  04/30/2020   MICROALBUR 0.4 10/11/2019   MICROALBUR 0.8 04/19/2018    Lipid Panel     Component Value Date/Time   CHOL 111 09/22/2022 0756   TRIG 197 (H) 09/22/2022 0756   HDL 19 (L) 09/22/2022 0756   CHOLHDL 5.8 (H) 09/22/2022 0756   CHOLHDL 5.5 (H) 10/11/2019 0856   VLDL 47 (H) 03/13/2019 0547   LDLCALC 59 09/22/2022 0756   LDLCALC 63 10/11/2019 0856     Assessment & Plan:   1. Uncontrolled type 2 diabetes mellitus with chronic kidney disease, coronary artery disease, with long-term current use of insulin .   Lance [name] presents with his CGM device showing improved glycemic profile averaging 162 mg per DL for the last 14 days.  His Dexcom overview shows 70% time in range, 26% level 1 hyperglycemia, 3% level 2 hyperglycemia.  His point-of-care A1c is 7.1%,  progressively improving. He did not document any hypoglycemia.   Recent labs reviewed. -his diabetes is complicated by obesity/sedentary life, coronary artery disease which recently required coronary catheterization, chronic heavy smoking and Lance [name] remains at exceedingly  high risk for more acute and chronic complications which include CAD, CVA, CKD, retinopathy, and neuropathy. These are all discussed in detail with the patient.  - I have counseled him on diet management and weight loss, by adopting a carbohydrate restricted/protein rich diet. -He is partially disengaged from lifestyle medicine.  He will still benefit from lifestyle medicine.   - he acknowledges that there is a room for improvement in his food and drink choices. - Suggestion is made for him to avoid simple carbohydrates  from his diet including Cakes, Sweet Desserts, Ice Cream, Soda (diet and regular), Sweet Tea, Candies, Chips, Cookies, Store Bought Juices, Alcohol  , Artificial Sweeteners,  Coffee Creamer, and Sugar-free Products, Lemonade. This will help patient to have more stable blood glucose profile and potentially avoid unintended weight  gain.  The following Lifestyle Medicine recommendations according to American College of Lifestyle Medicine  [facility]) were discussed and and offered to patient and he  agrees to start the journey:  A. Whole Foods, Plant-Based Nutrition comprising of fruits and vegetables, plant-based proteins, whole-grain carbohydrates was discussed in detail  with the patient.   A list for source of those nutrients were also provided to the patient.  Patient will use only water or unsweetened tea for hydration. B.  The need to stay away from risky substances including alcohol , smoking; obtaining 7 to 9 hours of restorative sleep, at least 150 minutes of moderate intensity exercise weekly, the importance of healthy social connections,  and stress management techniques were discussed. C.  A full color page of  Calorie density of various food groups per pound showing examples of each food groups was provided to the patient.   - he is advised to stick to a routine mealtimes to eat 3 meals  a day and avoid unnecessary snacks ( to snack only to correct hypoglycemia).   - I have approached him with the following individualized plan to manage diabetes and patient agrees:   -Based on his presentation, he will continue to need intensive treatment with basal/bolus insulin  in order for him to maintain control of diabetes to target.  -Accordingly, he is advised to continue Semglee  80 units nightly, continue NovoLog  at 20-26 units 3 times daily before meals when Premeal blood glucose readings are above 90 mg per DL. - He would have benefited from SGLT2 inhibitors and he was approached  for Jardiance , however patient declined.  -He is advised to use his CGM at all times, especially monitoring before meals and at bedtime.   -He is warned not to use insulin  without monitoring of blood glucose. -He did not tolerate higher dose of Ozempic  which caused abdominal pain.  -He is advised to continue Ozempic  1 mg subcutaneously weekly.  He  was approached with Mounjaro to allow him a higher dose to achieve weight control, however, at this time he does not want to change medications.   -Patient is encouraged to call clinic for blood glucose levels less than 70 or above 200 mg /dl. He does not want to take metformin , because he believes metformin  is a bad medication. - Patient specific target  A1c;  LDL, HDL, Triglycerides,  were discussed in detail.  2) BP/HTN: - His blood pressure is controlled to target.   He is advised to continue  with his current blood pressure medications including lisinopril  40 mg p.o. daily along with his carvedilol  3.125 mg p.o. twice daily.    3) Lipids: His recent lipid panel showed improved and controlled LDL at 59.  He is advised to continue Lipitor  80 mg p.o. nightly.  He is also on omega-3 fatty acids 1000 mg p.o. twice daily.  Whole food plant-based diet will help with dyslipidemia.      4)  Weight/Diet: His BMI is 40.64---clearly complicating his diabetes care.  He is a candidate for major weight loss.  CDE Consult has been initiated  , exercise, and detailed carbohydrates information provided.  He is an ideal candidate for bariatric surgery.    5) Chronic Care/Health Maintenance:  -he  is on ACEI/ARB and Statin medications and  is encouraged to continue to follow up with Ophthalmology, Dentist,  Podiatrist at least yearly or according to recommendations, and advised to  quit smoking. I have recommended yearly flu vaccine and pneumonia vaccination at least every 5 years; moderate intensity exercise for up to 150 minutes weekly; and  sleep for at least 7 hours a day. The patient was counseled on the dangers of tobacco use, and was advised to quit.  Reviewed strategies to maximize success, including removing cigarettes and smoking materials from environment.  -  I advised patient to maintain close follow up with Dow Longs, PA-C for primary care needs.   I spent  42  minutes in the care of the  patient today including review of labs from CMP, Lipids, Thyroid  Function, Hematology (current and previous including abstractions from other facilities); face-to-face time discussing  his blood glucose readings/logs, discussing hypoglycemia and hyperglycemia episodes and symptoms, medications doses, his options of short and long term treatment based on the latest standards of care / guidelines;  discussion about incorporating lifestyle medicine;  and documenting the encounter. Risk reduction counseling performed per USPSTF guidelines to reduce  obesity and cardiovascular risk factors.     Please refer to Patient Instructions for Blood Glucose Monitoring and Insulin /Medications Dosing Guide  in media tab for additional information. Please  also refer to  Patient Self Inventory in the Media  tab for reviewed elements of pertinent patient history.  Lance [name] participated in the discussions, expressed understanding, and voiced agreement with the above plans.  All questions were answered to his satisfaction. he is encouraged to contact clinic should he have any questions or concerns prior to his return visit.   Follow up plan: - Return in about 4 months (around 08/15/2024) for Bring Meter/CGM Device/Logs- A1c in Office.  Lance Earl, MD Phone: [phone]  Fax: [phone]  -  This note was partially dictated with voice recognition software. Similar sounding words can be transcribed inadequately or may not  be corrected upon review.  04/14/2024, 6:05 PM

## 2024-05-03 ENCOUNTER — Ambulatory Visit (INDEPENDENT_AMBULATORY_CARE_PROVIDER_SITE_OTHER): Admitting: Gastroenterology

## 2024-05-03 ENCOUNTER — Encounter (INDEPENDENT_AMBULATORY_CARE_PROVIDER_SITE_OTHER): Payer: Self-pay | Admitting: Gastroenterology

## 2024-05-03 VITALS — BP 125/73 | HR 79 | Temp 97.8°F | Ht 71.0 in | Wt 287.8 lb

## 2024-05-03 DIAGNOSIS — D509 Iron deficiency anemia, unspecified: Secondary | ICD-10-CM

## 2024-05-03 DIAGNOSIS — K317 Polyp of stomach and duodenum: Secondary | ICD-10-CM | POA: Insufficient documentation

## 2024-05-03 DIAGNOSIS — Z860101 Personal history of adenomatous and serrated colon polyps: Secondary | ICD-10-CM | POA: Diagnosis not present

## 2024-05-03 DIAGNOSIS — Z6841 Body Mass Index (BMI) 40.0 and over, adult: Secondary | ICD-10-CM

## 2024-05-03 DIAGNOSIS — D500 Iron deficiency anemia secondary to blood loss (chronic): Secondary | ICD-10-CM

## 2024-05-03 DIAGNOSIS — Z860100 Personal history of colon polyps, unspecified: Secondary | ICD-10-CM

## 2024-05-03 NOTE — Patient Instructions (Signed)
 It was very nice to meet you today, as dicussed with will plan for the following :  1) upper endoscopy and colonoscopy ; miralax  twice daily for 10days

## 2024-05-03 NOTE — Progress Notes (Signed)
 Lance [name] Lance [name] Lance [name] Lance [name] , M.D. Gastroenterology & Hepatology [facility] Gastroenterology [address] [place], [place] [address] Primary Care Physician: Dow Longs, PA-C [address] [place] [address]  Chief Complaint:  IDA .  Gastric hyperplastic polyp  History of Present Illness: Lance [name] is a 48 y.o. male with history of CAD, MI, PCI with DES on dual antiplatelet therapy, LVH, grade 1 diastolic dysfunction, type 2 diabetes, HTN, HLD, GERD, IDA,  who presents for evaluation of  iron  deficiency anemia, gastric hyperplastic polyps and colon polyps  Patient has been following with hematology and getting iron  infusion with significant improvement of his anemia.  His last PCI was over 1 year ago and was seen by cardiology. the patient denies having any nausea, vomiting, fever, chills, hematochezia, melena, hematemesis, abdominal distention, abdominal pain, diarrhea, jaundice, pruritus or weight loss.  Patient had underwent bidirectional endoscopy and capsule endoscopy.  Gastric hyperplastic polyps in the antrum were not resected.  Capsule endoscopy without any overt bleeding  EGD 01/2024  - Normal esophagus. - Gastritis. - Multiple gastric polyps. - Normal duodenal bulb, first portion of the duodenum and second portion of the duodenum. - No specimens collected.  colonoscopy 08/06/2023: Three 5 to 6 mm polyps in the ascending colon resected and retrieved, one 6 mm polyp in the transverse colon resected and retrieved with MR conditional clip placed.  Nonbleeding internal and external hemorrhoids.  Recommended repeat colonoscopy in 1 year for surveillance given fair prep.  Will need extended bowel prep.  Pathology with tubular adenomas.   EGD 08/06/2023: Normal esophagus, erythematous mucosa in the stomach biopsied, multiple gastric polyps biopsied, normal examined duodenum.  Pathology showed mild nonspecific reactive gastropathy, no H. pylori, hyperplastic gastric  polyp.  Recommended repeat EGD in 1 year.    A. STOMACH, BIOPSY:  - Gastric antral and oxyntic mucosa with mild nonspecific reactive  gastropathy  - Helicobacter pylori-like organisms are not identified on routine HE  stain   B. STOMACH POLYP, BIOPSY:  - Gastric hyperplastic polyp  - Negative for intestinal metaplasia or dysplasia   C. COLON, ASCENDING, TRANSVERSE, POLYPECTOMY:  - Tubular adenoma(s)  - Negative for high-grade dysplasia or malignancy   Capsule endoscopy 06/20/ 2025  Atleast 3 gastric polyp and a single erosion seen in the stomach Red spot in the small bowel No active or old bleeding seen   Past Medical History: Past Medical History:  Diagnosis Date   CAD (coronary artery disease), native coronary artery    10/18 PCI/DES to mRCA, and OM, with total occlusion of dLCx with collaterals.    Essential hypertension    GERD (gastroesophageal reflux disease)    History of kidney stones    Hyperlipemia    MI (myocardial infarction) (HCC) 2020   SVT (supraventricular tachycardia) (HCC) 2015   Converted with adenosine    Type 2 diabetes mellitus (HCC)     Past Surgical History: Past Surgical History:  Procedure Laterality Date   BIOPSY  08/06/2023   Procedure: BIOPSY;  Surgeon: Cinderella Deatrice FALCON, MD;  [facility];  Service: Endoscopy;;   CARDIAC SURGERY     COLONOSCOPY WITH PROPOFOL  N/A 08/06/2023   Procedure: COLONOSCOPY WITH PROPOFOL ;  Surgeon: Cinderella Deatrice FALCON, MD;  [facility];  Service: Endoscopy;  Laterality: N/A;   CORONARY ANGIOPLASTY WITH STENT PLACEMENT  07/15/2017   2 stents   CORONARY BALLOON ANGIOPLASTY N/A 01/16/2023   Procedure: CORONARY BALLOON ANGIOPLASTY;  Surgeon: Burnard Debby LABOR,  MD;  [facility]: [facility];  Service: Cardiovascular;  Laterality: N/A;   CORONARY/GRAFT ACUTE MI REVASCULARIZATION N/A 03/12/2019   Procedure: Coronary/Graft Acute MI Revascularization;  Surgeon: Mady Bruckner, MD;  Location: [facility]VAS[facility] CV [facility];  Service: Cardiovascular;  Laterality: N/A;   ESOPHAGOGASTRODUODENOSCOPY N/A 01/25/2024   Procedure: EGD (ESOPHAGOGASTRODUODENOSCOPY);  Surgeon: Cindie Carlin POUR, DO;  [facility];  Service: Endoscopy;  Laterality: N/A;   ESOPHAGOGASTRODUODENOSCOPY (EGD) WITH PROPOFOL  N/A 08/06/2023   Procedure: ESOPHAGOGASTRODUODENOSCOPY (EGD) WITH PROPOFOL ;  Surgeon: Cinderella Deatrice FALCON, MD;  [facility];  Service: Endoscopy;  Laterality: N/A;  1:30pm;asa 3   GIVENS CAPSULE STUDY N/A 03/07/2024   Procedure: IMAGING PROCEDURE, GI TRACT, INTRALUMINAL, VIA CAPSULE;  Surgeon: Cinderella Deatrice FALCON, MD;  [facility];  Service: Endoscopy;  Laterality: N/A;  7:30AM;GIVENS   HEMOSTASIS CLIP PLACEMENT  08/06/2023   Procedure: HEMOSTASIS CLIP PLACEMENT;  Surgeon: Cinderella Deatrice FALCON, MD;  [facility];  Service: Endoscopy;;   LAPAROSCOPIC APPENDECTOMY N/A 11/12/2017   Procedure: APPENDECTOMY LAPAROSCOPIC, REPAIR OF INCARCERATED INCISIONAL HERNIA;  Surgeon: Sheldon Standing, MD;  Location: [facility];  Service: General;  Laterality: N/A;   LAPAROSCOPIC CHOLECYSTECTOMY     LEFT HEART CATH AND CORONARY ANGIOGRAPHY N/A 07/15/2017   Procedure: LEFT HEART CATH AND CORONARY ANGIOGRAPHY;  Surgeon: Wonda Sharper, MD;  Location: [facility] INVASIVE CV LAB;  Service: Cardiovascular;  Laterality: N/A;   LEFT HEART CATH AND CORONARY ANGIOGRAPHY N/A 03/12/2019   Procedure: LEFT HEART CATH AND CORONARY ANGIOGRAPHY;  Surgeon: Mady Bruckner, MD;  [facility];  Service: Cardiovascular;  Laterality: N/A;   LEFT HEART CATH AND CORONARY ANGIOGRAPHY N/A 01/16/2023   Procedure: LEFT HEART CATH AND CORONARY ANGIOGRAPHY;  Surgeon: Burnard Debby LABOR, MD;  [facility];  Service: Cardiovascular;  Laterality: N/A;   POLYPECTOMY  08/06/2023   Procedure: POLYPECTOMY INTESTINAL;  Surgeon: Cinderella Deatrice FALCON, MD;  [facility];  Service: Endoscopy;;   SVT ABLATION N/A  04/22/2021   Procedure: SVT ABLATION;  Surgeon: Waddell Danelle ORN, MD;  [facility];  Service: Cardiovascular;  Laterality: N/A;    Family History: Family History  Problem Relation Age of Onset   Heart disease Mother 48       CABG   Diabetes Mother    Gout Mother    Heart attack Father 47    Social History: Social History   Tobacco Use  Smoking Status Former   Current packs/day: 0.00   Average packs/day: 0.3 packs/day for 26.0 years (6.5 ttl pk-yrs)   Types: Cigarettes   Start date: 03/06/1993   Quit date: 03/07/2019   Years since quitting: 5.1   Passive exposure: Never  Smokeless Tobacco Never   Social History   Substance and Sexual Activity  Alcohol  Use No   Social History   Substance and Sexual Activity  Drug Use No    Allergies: Allergies  Allergen Reactions   Bee Venom Anaphylaxis and Swelling   Penicillins Hives, Itching and Rash    Medications: Current Outpatient Medications  Medication Sig Dispense Refill   aspirin  EC 81 MG tablet Take 1 tablet (81 mg total) by mouth daily with breakfast. 120 tablet 5   atorvastatin  (LIPITOR ) 80 MG tablet TAKE 1 TABLET BY MOUTH EVERYDAY AT BEDTIME 90 tablet 1   B-D ULTRAFINE III SHORT PEN 31G X 8 MM MISC USE TO INJECT INSULIN  4 (FOUR) TIMES DAILY. 400 each 2  blood glucose meter kit and supplies KIT 1 each by Does not apply route 4 (four) times daily. Dispense based on patient and insurance preference. Use up to four times daily as directed. (FOR ICD-9 250.00, 250.01). 1 each 0   Continuous Glucose Sensor (DEXCOM G7 SENSOR) MISC CHANGE SENSOR EVERY 10 DAYS 9 each 0   docusate sodium  (COLACE) 100 MG capsule Take 1 capsule (100 mg total) by mouth 2 (two) times daily. 60 capsule 2   famotidine  (PEPCID ) 40 MG tablet Take 40 mg by mouth daily.     ferrous sulfate  325 (65 FE) MG EC tablet Take 1 tablet (325 mg total) by mouth 2 (two) times daily. 60 tablet 3   insulin  aspart (NOVOLOG  FLEXPEN) 100 UNIT/ML FlexPen  INJECT 20-26 UNITS INTO THE SKIN 3 (THREE) TIMES DAILY WITH MEALS. 75 mL 0   insulin  glargine-yfgn (SEMGLEE ) 100 UNIT/ML Pen INJECT 80 UNITS INTO THE SKIN AT BEDTIME. 75 mL 0   lisinopril  (ZESTRIL ) 40 MG tablet TAKE 1 TABLET BY MOUTH EVERY DAY 90 tablet 1   nitroGLYCERIN  (NITROSTAT ) 0.4 MG SL tablet Place 1 tablet (0.4 mg total) under the tongue every 5 (five) minutes x 3 doses as needed for chest pain. 25 tablet 3   OZEMPIC , 0.25 OR 0.5 MG/DOSE, 2 MG/3ML SOPN Inject 1 mg into the skin once a week.     pantoprazole  (PROTONIX ) 40 MG tablet Take 40 mg by mouth at bedtime.     ticagrelor  (BRILINTA ) 60 MG TABS tablet Take 1 tablet (60 mg total) by mouth 2 (two) times daily. 60 tablet 11   No current facility-administered medications for this visit.    Review of Systems: GENERAL: negative for malaise, night sweats HEENT: No changes in hearing or vision, no nose bleeds or other nasal problems. NECK: Negative for lumps, goiter, pain and significant neck swelling RESPIRATORY: Negative for cough, wheezing CARDIOVASCULAR: Negative for chest pain, leg swelling, palpitations, orthopnea GI: SEE HPI MUSCULOSKELETAL: Negative for joint pain or swelling, back pain, and muscle pain. SKIN: Negative for lesions, rash HEMATOLOGY Negative for prolonged bleeding, bruising easily, and swollen nodes. ENDOCRINE: Negative for cold or heat intolerance, polyuria, polydipsia and goiter. NEURO: negative for tremor, gait imbalance, syncope and seizures. The remainder of the review of systems is noncontributory.   Physical Exam: BP 125/73   Pulse 79   Temp 97.8 F (36.6 C)   Ht 5' 11 (1.803 m)   Wt 287 lb 12.8 oz (130.5 kg)   BMI 40.14 kg/m  GENERAL: The patient is AO x3, in no acute distress. HEENT: Head is normocephalic and atraumatic. EOMI are intact. Mouth is well hydrated and without lesions. NECK: Supple. No masses LUNGS: Clear to auscultation. No presence of rhonchi/wheezing/rales. Adequate chest  expansion HEART: RRR, normal s1 and s2. ABDOMEN: Soft, nontender, no guarding, no peritoneal signs, and nondistended. BS +. No masses.  Imaging/Labs: as above     Latest Ref Rng & Units 03/14/2024   12:45 PM 01/25/2024    5:00 AM 01/24/2024    8:52 PM  CBC  WBC 4.0 - 10.5 K/uL 9.9  10.0  10.3   Hemoglobin 13.0 - 17.0 g/dL 87.5  8.4  8.4   Hematocrit 39.0 - 52.0 % 41.2  28.2  27.9   Platelets 150 - 400 K/uL 235  228  225    Lab Results  Component Value Date   IRON  39 (L) 03/14/2024   TIBC 402 03/14/2024   FERRITIN 24 03/14/2024    I  personally reviewed and interpreted the available labs, imaging and endoscopic files.  Impression and Plan:  Lance [name] is a 48 y.o. male with history of CAD, MI, PCI with DES on dual antiplatelet therapy, LVH, grade 1 diastolic dysfunction, type 2 diabetes, HTN, HLD, GERD, IDA,  who presents for evaluation of iron  deficiency anemia gastric hyperplastic polyp  # Iron  deficiency anemia # Gastric hyperplastic polyp # History of colon tubular adenoma   Patient HBG has significantly improved now (8--->12.4) with improvement of IDA (Ferritin : 3--->24)    Patient had 2 upper endoscopies only found to have nonbleeding gastric hyperplastic polyp not removed as patient is on dual antiplatelet and colonoscopy done last year with subcentimeter tubular adenoma  Patient was seen last by cardiology 02/2024 and was cleared to hold Brilinta  for 5 days.  Continue aspirin   It has been 1 year since his last cardiac cath would ideally off Brilinta  x 5 days to remove all of his gastric polyps, along with colonoscopy ( extended bowel prep -MiraLAX  twice daily for 10 days at least)   Continue aspirin  regardless    Proceed with upper endoscopy with removal of all gastric polyps and surveillance colonoscopy given history of colon with fair bowel prep  If above is negative and patient continues to be IDA , would recommend repeat SBE in future   Urinalysis evaluate  for any microscopic hematuria.  Celiac serologies  Oral iron  with MiraLAX , stop oral iron  10 days prior to the procedure   All questions were answered.      Ameliana Brashear Lance [name] Dionisia Pacholski, MD Gastroenterology and Hepatology [facility] Gastroenterology   This chart has been completed using [facility] Dictation software, and while attempts have been made to ensure accuracy , certain words and phrases may not be transcribed as intended

## 2024-05-09 ENCOUNTER — Emergency Department (HOSPITAL_COMMUNITY)

## 2024-05-09 ENCOUNTER — Emergency Department (HOSPITAL_COMMUNITY)
Admission: EM | Admit: 2024-05-09 | Discharge: 2024-05-09 | Disposition: A | Discharge status: Home / Self Care | Attending: Emergency Medicine | Admitting: Emergency Medicine

## 2024-05-09 ENCOUNTER — Encounter (HOSPITAL_COMMUNITY): Payer: Self-pay | Admitting: *Deleted

## 2024-05-09 ENCOUNTER — Other Ambulatory Visit: Payer: Self-pay

## 2024-05-09 DIAGNOSIS — I2510 Atherosclerotic heart disease of native coronary artery without angina pectoris: Secondary | ICD-10-CM | POA: Diagnosis not present

## 2024-05-09 DIAGNOSIS — Z7982 Long term (current) use of aspirin: Secondary | ICD-10-CM | POA: Insufficient documentation

## 2024-05-09 DIAGNOSIS — Z794 Long term (current) use of insulin: Secondary | ICD-10-CM | POA: Insufficient documentation

## 2024-05-09 DIAGNOSIS — R55 Syncope and collapse: Secondary | ICD-10-CM | POA: Insufficient documentation

## 2024-05-09 LAB — COMPREHENSIVE METABOLIC PANEL WITH GFR
ALT: 33 U/L (ref 0–44)
AST: 25 U/L (ref 15–41)
Albumin: 3.8 g/dL (ref 3.5–5.0)
Alkaline Phosphatase: 87 U/L (ref 38–126)
Anion gap: 10 (ref 5–15)
BUN: 12 mg/dL (ref 6–20)
CO2: 23 mmol/L (ref 22–32)
Calcium: 8.9 mg/dL (ref 8.9–10.3)
Chloride: 104 mmol/L (ref 98–111)
Creatinine, Ser: 1 mg/dL (ref 0.61–1.24)
GFR, Estimated: >60 mL/min (ref >60)
Glucose, Bld: 114 mg/dL — ABNORMAL HIGH (ref 70–99)
Potassium: 3.9 mmol/L (ref 3.5–5.1)
Sodium: 137 mmol/L (ref 135–145)
Total Bilirubin: 0.8 mg/dL (ref 0.0–1.2)
Total Protein: 6.7 g/dL (ref 6.5–8.1)

## 2024-05-09 LAB — CBC WITH DIFFERENTIAL/PLATELET
Abs Immature Granulocytes: 0.03 K/uL (ref 0.00–0.07)
Basophils Absolute: 0.1 K/uL (ref 0.0–0.1)
Basophils Relative: 1 %
Eosinophils Absolute: 0.3 K/uL (ref 0.0–0.5)
Eosinophils Relative: 3 %
HCT: 43.5 % (ref 39.0–52.0)
Hemoglobin: 14.4 g/dL (ref 13.0–17.0)
Immature Granulocytes: 0 %
Lymphocytes Relative: 24 %
Lymphs Abs: 2.3 K/uL (ref 0.7–4.0)
MCH: 27.9 pg (ref 26.0–34.0)
MCHC: 33.1 g/dL (ref 30.0–36.0)
MCV: 84.3 fL (ref 80.0–100.0)
Monocytes Absolute: 0.8 K/uL (ref 0.1–1.0)
Monocytes Relative: 8 %
Neutro Abs: 6.1 K/uL (ref 1.7–7.7)
Neutrophils Relative %: 64 %
Platelets: 197 K/uL (ref 150–400)
RBC: 5.16 MIL/uL (ref 4.22–5.81)
RDW: 16.7 % — ABNORMAL HIGH (ref 11.5–15.5)
WBC: 9.6 K/uL (ref 4.0–10.5)
nRBC: 0 % (ref 0.0–0.2)

## 2024-05-09 LAB — TROPONIN I (HIGH SENSITIVITY)
Troponin I (High Sensitivity): 13 ng/L (ref <18)
Troponin I (High Sensitivity): 15 ng/L (ref <18)

## 2024-05-09 LAB — CBG MONITORING, ED: Glucose-Capillary: 71 mg/dL (ref 70–99)

## 2024-05-09 NOTE — Discharge Instructions (Signed)
 Follow-up with your cardiologist in the next week.  Return here sooner if any problems

## 2024-05-09 NOTE — ED Triage Notes (Signed)
 Pt with near syncope Saturday, c/o fatigue since.  Pt states he has been having CP/abd pain for over a year.  Denies any CP at present.  + mild SOB

## 2024-05-09 NOTE — ED Provider Notes (Signed)
  Physical Exam  BP 132/65   Pulse 61   Temp 98 F (36.7 C) (Oral)   Resp 15   Ht 5' 11 (1.803 m)   Wt 127 kg   SpO2 97%   BMI 39.05 kg/m   Physical Exam  Procedures  Procedures  ED Course / MDM   Clinical Course as of 05/09/24 1621  Mon May 09, 2024  1457 Assumed care from Dr Suzette. 48 yo M hx of MI sp PCI who passed out Saturday and decided to come in today. EKG with sinus rhythm with RAD but no other concerning findings. Initial troponin 13. Workup has been reassuring thus far.  Awaiting repeat troponin and can follow-up with cardiology as an outpatient if normal. [RP]  1620 Troponins went from 13-15.  Patient remains asymptomatic.  Did discuss what happened with him he says that he got lightheaded and sweaty but no chest pain or shortness of breath.  Felt he was going to pass out but did not actually lose consciousness.  Has been asymptomatic since Saturday when it happened.  No other recent episodes of this happening or palpitations.  Performed shared decision making regarding disposition and since he does have a cardiac history.  Patient is requesting go home at this time with outpatient cardiology follow-up which is reasonable.  Return precautions discussed prior to discharge. [RP]    Clinical Course User Index [RP] Yolande Lamar BROCKS, MD   Medical Decision Making Amount and/or Complexity of Data Reviewed Labs: ordered. Radiology: ordered.       Yolande Lamar BROCKS, MD 05/09/24 [phone]

## 2024-05-09 NOTE — ED Notes (Signed)
Sandwhich and drink given to pt

## 2024-05-09 NOTE — ED Provider Notes (Signed)
  EMERGENCY DEPARTMENT AT [facility] Provider Note   CSN: [id number] Arrival date & time: 05/09/24  1138     Patient presents with: Near Syncope   Lance [name] is a 48 y.o. male.  {Add pertinent medical, surgical, social history, OB history to YEP:67052} Patient has a history of coronary artery disease.  He had a syncopal episode this weekend but feels fine now.  The history is provided by the patient and medical records. No language interpreter was used.  Near Syncope This is a new problem. The current episode started more than 2 days ago. The problem occurs rarely. The problem has been resolved. Pertinent negatives include no chest pain, no abdominal pain and no headaches. Nothing aggravates the symptoms. Nothing relieves the symptoms. He has tried nothing for the symptoms.       Prior to Admission medications   Medication Sig Start Date End Date Taking? Authorizing Provider  aspirin  EC 81 MG tablet Take 1 tablet (81 mg total) by mouth daily with breakfast. 04/05/19   Pearlean, Courage, MD  atorvastatin  (LIPITOR ) 80 MG tablet TAKE 1 TABLET BY MOUTH EVERYDAY AT BEDTIME 02/25/24   Debera Jayson MATSU, MD  B-D ULTRAFINE III SHORT PEN 31G X 8 MM MISC USE TO INJECT INSULIN  4 (FOUR) TIMES DAILY. 10/01/23   Nida, Gebreselassie W, MD  blood glucose meter kit and supplies KIT 1 each by Does not apply route 4 (four) times daily. Dispense based on patient and insurance preference. Use up to four times daily as directed. (FOR ICD-9 250.00, 250.01). 02/14/20   Nida, Ethelle ORN, MD  Continuous Glucose Sensor (DEXCOM G7 SENSOR) MISC CHANGE SENSOR EVERY 10 DAYS 03/16/24   Nida, Gebreselassie W, MD  docusate sodium  (COLACE) 100 MG capsule Take 1 capsule (100 mg total) by mouth 2 (two) times daily. 01/25/24 01/24/25  Maree, Pratik D, DO  famotidine  (PEPCID ) 40 MG tablet Take 40 mg by mouth daily. 04/30/23   [provider]  ferrous sulfate  325 (65 FE) MG EC tablet Take 1 tablet  (325 mg total) by mouth 2 (two) times daily. 01/25/24 01/24/25  Maree, Pratik D, DO  insulin  aspart (NOVOLOG  FLEXPEN) 100 UNIT/ML FlexPen INJECT 20-26 UNITS INTO THE SKIN 3 (THREE) TIMES DAILY WITH MEALS. 02/25/24   Nida, Gebreselassie W, MD  insulin  glargine-yfgn (SEMGLEE ) 100 UNIT/ML Pen INJECT 80 UNITS INTO THE SKIN AT BEDTIME. 02/25/24   Nida, Gebreselassie W, MD  LANTUS  SOLOSTAR 100 UNIT/ML Solostar Pen Inject 80 Units into the skin at bedtime. 04/17/24   [provider]  lisinopril  (ZESTRIL ) 40 MG tablet TAKE 1 TABLET BY MOUTH EVERY DAY 10/30/23   Debera Jayson MATSU, MD  nitroGLYCERIN  (NITROSTAT ) 0.4 MG SL tablet Place 1 tablet (0.4 mg total) under the tongue every 5 (five) minutes x 3 doses as needed for chest pain. 09/02/21   Debera Jayson MATSU, MD  OZEMPIC , 1 MG/DOSE, 4 MG/3ML SOPN Inject 1 mg into the skin once a week. 04/17/24   [provider]  pantoprazole  (PROTONIX ) 40 MG tablet Take 40 mg by mouth at bedtime. 01/04/24   [provider]  ticagrelor  (BRILINTA ) 90 MG TABS tablet Take 90 mg by mouth 2 (two) times daily. 03/16/24   [provider]    Allergies: Bee venom and Penicillins    Review of Systems  Constitutional:  Negative for appetite change and fatigue.  HENT:  Negative for congestion, ear discharge and sinus pressure.   Eyes:  Negative for discharge.  Respiratory:  Negative for cough.   Cardiovascular:  Positive for near-syncope. Negative for chest pain.  Gastrointestinal:  Negative for abdominal pain and diarrhea.  Genitourinary:  Negative for frequency and hematuria.  Musculoskeletal:  Negative for back pain.  Skin:  Negative for rash.  Neurological:  Negative for seizures and headaches.  Psychiatric/Behavioral:  Negative for hallucinations.     Updated Vital Signs BP 132/65   Pulse 61   Temp 98 F (36.7 C) (Oral)   Resp 15   Ht 5' 11 (1.803 m)   Wt 127 kg   SpO2 97%   BMI 39.05 kg/m   Physical Exam Vitals and nursing note  reviewed.  Constitutional:      Appearance: He is well-developed.  HENT:     Head: Normocephalic.     Nose: Nose normal.  Eyes:     General: No scleral icterus.    Conjunctiva/sclera: Conjunctivae normal.  Neck:     Thyroid : No thyromegaly.  Cardiovascular:     Rate and Rhythm: Normal rate and regular rhythm.     Heart sounds: No murmur heard.    No friction rub. No gallop.  Pulmonary:     Breath sounds: No stridor. No wheezing or rales.  Chest:     Chest wall: No tenderness.  Abdominal:     General: There is no distension.     Tenderness: There is no abdominal tenderness. There is no rebound.  Musculoskeletal:        General: Normal range of motion.     Cervical back: Neck supple.  Lymphadenopathy:     Cervical: No cervical adenopathy.  Skin:    Findings: No erythema or rash.  Neurological:     Mental Status: He is alert and oriented to person, place, and time.     Motor: No abnormal muscle tone.     Coordination: Coordination normal.  Psychiatric:        Behavior: Behavior normal.     (all labs ordered are listed, but only abnormal results are displayed) Labs Reviewed  COMPREHENSIVE METABOLIC PANEL WITH GFR - Abnormal; Notable for the following components:      Result Value   Glucose, Bld 114 (*)    All other components within normal limits  CBC WITH DIFFERENTIAL/PLATELET - Abnormal; Notable for the following components:   RDW 16.7 (*)    All other components within normal limits  CBC WITH DIFFERENTIAL/PLATELET  CBG MONITORING, ED  TROPONIN I (HIGH SENSITIVITY)  TROPONIN I (HIGH SENSITIVITY)    EKG: EKG Interpretation Date/Time:  Monday May 09 2024 11:47:24 EDT Ventricular Rate:  70 PR Interval:  192 QRS Duration:  96 QT Interval:  394 QTC Calculation: 426 R Axis:   100  Text Interpretation: Sinus rhythm Right axis deviation Confirmed by Suzette Pac [phone]) on 05/09/2024 2:23:57 PM  Radiology: [facility] Chest Port 1 View Result Date:  05/09/2024 CLINICAL DATA:  Weakness. EXAM: PORTABLE CHEST 1 VIEW COMPARISON:  Chest radiograph dated 01/24/2024. FINDINGS: There is mild cardiomegaly. No focal consolidation, pleural effusion, pneumothorax. No acute osseous pathology. IMPRESSION: 1. No active disease. 2. Mild cardiomegaly. Electronically Signed   By: Vanetta Chou M.D.   On: 05/09/2024 12:31    {Document cardiac monitor, telemetry assessment procedure when appropriate:32947} Procedures   Medications Ordered in the ED - No data to display  Clinical Course as of 05/09/24 1512  Mon May 09, 2024  1457 Assumed care from Dr Suzette. 48 yo M hx of MI sp PCI who passed out  Saturday and decided to come in today. EKG with sinus rhythm with RAD but no other concerning findings. Initial troponin 13. Workup has been reassuring thus far.  Awaiting repeat troponin and can follow-up with cardiology as an outpatient if normal. [RP]    Clinical Course User Index [RP] Yolande Lamar BROCKS, MD   {Click here for ABCD2, HEART and other calculators REFRESH Note before signing:1}                              Medical Decision Making Amount and/or Complexity of Data Reviewed Labs: ordered. Radiology: ordered.   Syncopal episode.  Second troponin pending.  Patient will follow-up with cardiology after second troponin is unremarkable  {Document critical care time when appropriate  Document review of labs and clinical decision tools ie CHADS2VASC2, etc  Document your independent review of radiology images and any outside records  Document your discussion with family members, caretakers and with consultants  Document social determinants of health affecting pt's care  Document your decision making why or why not admission, treatments were needed:32947:::1}   Final diagnoses:  None    ED Discharge Orders     None

## 2024-05-10 ENCOUNTER — Telehealth (INDEPENDENT_AMBULATORY_CARE_PROVIDER_SITE_OTHER): Payer: Self-pay | Admitting: Gastroenterology

## 2024-05-10 NOTE — Telephone Encounter (Signed)
 Pt left message requesting refill on iron  tablets sent to [facility]. Pt last seen 05/03/24. Is pt to continue on Iron ?

## 2024-05-10 NOTE — Telephone Encounter (Signed)
 Yes continue Oral iron  with MiraLAX , stop oral iron  10 days prior to the procedure

## 2024-05-11 ENCOUNTER — Encounter (INDEPENDENT_AMBULATORY_CARE_PROVIDER_SITE_OTHER): Payer: Self-pay

## 2024-05-11 MED ORDER — FERROUS SULFATE 325 (65 FE) MG PO TBEC: 325.0000 mg | DELAYED_RELEASE_TABLET | Freq: Two times a day (BID) | ORAL | 3 refills | Status: DC

## 2024-05-11 NOTE — Addendum Note (Signed)
 Addended by: Tiamarie Furnari on: 05/11/2024 07:41 AM   Modules accepted: Orders

## 2024-05-11 NOTE — Telephone Encounter (Signed)
 Refill sent to pharmacy and my chart sent to pt to make him aware of provider recommendations.

## 2024-05-13 ENCOUNTER — Inpatient Hospital Stay: Attending: Hematology

## 2024-05-13 ENCOUNTER — Encounter: Payer: Self-pay | Admitting: *Deleted

## 2024-05-13 ENCOUNTER — Other Ambulatory Visit: Payer: Self-pay | Admitting: *Deleted

## 2024-05-13 DIAGNOSIS — G4700 Insomnia, unspecified: Secondary | ICD-10-CM | POA: Diagnosis not present

## 2024-05-13 DIAGNOSIS — R110 Nausea: Secondary | ICD-10-CM | POA: Insufficient documentation

## 2024-05-13 DIAGNOSIS — D509 Iron deficiency anemia, unspecified: Secondary | ICD-10-CM | POA: Diagnosis present

## 2024-05-13 DIAGNOSIS — D500 Iron deficiency anemia secondary to blood loss (chronic): Secondary | ICD-10-CM

## 2024-05-13 DIAGNOSIS — R42 Dizziness and giddiness: Secondary | ICD-10-CM | POA: Insufficient documentation

## 2024-05-13 DIAGNOSIS — R519 Headache, unspecified: Secondary | ICD-10-CM | POA: Diagnosis not present

## 2024-05-13 LAB — COMPREHENSIVE METABOLIC PANEL WITH GFR
ALT: 30 U/L (ref 0–44)
AST: 21 U/L (ref 15–41)
Albumin: 4 g/dL (ref 3.5–5.0)
Alkaline Phosphatase: 85 U/L (ref 38–126)
Anion gap: 12 (ref 5–15)
BUN: 15 mg/dL (ref 6–20)
CO2: 24 mmol/L (ref 22–32)
Calcium: 9.3 mg/dL (ref 8.9–10.3)
Chloride: 101 mmol/L (ref 98–111)
Creatinine, Ser: 1.08 mg/dL (ref 0.61–1.24)
GFR, Estimated: >60 mL/min (ref >60)
Glucose, Bld: 141 mg/dL — ABNORMAL HIGH (ref 70–99)
Potassium: 3.9 mmol/L (ref 3.5–5.1)
Sodium: 137 mmol/L (ref 135–145)
Total Bilirubin: 0.7 mg/dL (ref 0.0–1.2)
Total Protein: 7 g/dL (ref 6.5–8.1)

## 2024-05-13 LAB — CBC WITH DIFFERENTIAL/PLATELET
Abs Immature Granulocytes: 0.03 K/uL (ref 0.00–0.07)
Basophils Absolute: 0.1 K/uL (ref 0.0–0.1)
Basophils Relative: 1 %
Eosinophils Absolute: 0.4 K/uL (ref 0.0–0.5)
Eosinophils Relative: 4 %
HCT: 45.3 % (ref 39.0–52.0)
Hemoglobin: 15 g/dL (ref 13.0–17.0)
Immature Granulocytes: 0 %
Lymphocytes Relative: 29 %
Lymphs Abs: 3.4 K/uL (ref 0.7–4.0)
MCH: 28.2 pg (ref 26.0–34.0)
MCHC: 33.1 g/dL (ref 30.0–36.0)
MCV: 85.2 fL (ref 80.0–100.0)
Monocytes Absolute: 1 K/uL (ref 0.1–1.0)
Monocytes Relative: 8 %
Neutro Abs: 7 K/uL (ref 1.7–7.7)
Neutrophils Relative %: 58 %
Platelets: 228 K/uL (ref 150–400)
RBC: 5.32 MIL/uL (ref 4.22–5.81)
RDW: 16.6 % — ABNORMAL HIGH (ref 11.5–15.5)
WBC: 11.9 K/uL — ABNORMAL HIGH (ref 4.0–10.5)
nRBC: 0 % (ref 0.0–0.2)

## 2024-05-13 LAB — IRON AND TIBC
Iron: 54 ug/dL (ref 45–182)
Saturation Ratios: 14 % — ABNORMAL LOW (ref 17.9–39.5)
TIBC: 383 ug/dL (ref 250–450)
UIBC: 329 ug/dL

## 2024-05-13 LAB — FERRITIN: Ferritin: 24 ng/mL (ref 24–336)

## 2024-05-13 LAB — FOLATE: Folate: 12.1 ng/mL (ref >5.9)

## 2024-05-13 LAB — VITAMIN B12: Vitamin B-12: 310 pg/mL (ref 180–914)

## 2024-05-13 MED ORDER — SUTAB 1479-225-188 MG PO TABS: 12.0000 | ORAL_TABLET | ORAL | 0 refills | Status: AC

## 2024-05-13 NOTE — Telephone Encounter (Signed)
 [facility] for colonoscopy: CPT Code GECOL Description: Colonoscopy Case Number: [id number] Review Date: 05/13/2024 9:38:20 AM Expiration Date: N/A Status: This member is not in scope for prior-authorization/notification for the services requested. You can save the case reference ID as validation of your request.  [facility] PA for EGD: CPT Code GEEGD Description: Esophagogastroduodenoscopy Case Number: [id number] Review Date: 05/13/2024 9:42:13 AM Expiration Date: N/A Status: This member is not in scope for prior-authorization/notification for the services requested. You can save the case reference ID as validation of your request.

## 2024-05-20 ENCOUNTER — Inpatient Hospital Stay (HOSPITAL_BASED_OUTPATIENT_CLINIC_OR_DEPARTMENT_OTHER): Admitting: Oncology

## 2024-05-20 VITALS — BP 148/84 | HR 82 | Temp 97.6°F | Resp 16 | Wt 290.1 lb

## 2024-05-20 DIAGNOSIS — D500 Iron deficiency anemia secondary to blood loss (chronic): Secondary | ICD-10-CM

## 2024-05-20 DIAGNOSIS — D509 Iron deficiency anemia, unspecified: Secondary | ICD-10-CM | POA: Diagnosis not present

## 2024-05-20 NOTE — Progress Notes (Signed)
   [facility] OFFICE PROGRESS NOTE  Dow Longs, PA-C  ASSESSMENT & PLAN:  Assessment & Plan Iron  deficiency anemia due to chronic blood loss The most likely cause of his anemia is due to chronic blood loss   Last colonoscopy/endoscopy:  Endoscopy 01/25/2024: Gastric polyps, no active bleeding Colonoscopy: 08/06/2023: No active bleeding Capsule endoscopy results: Pending at this time Status post IV iron -Venofer  500 mg X3 doses.  Labs from 05/13/24 shows improvement of his hemoglobin. Iron  panel is more or less stable with still relatively     -Will administer 1 more doses of IV Venofer  500 mg.  Reemphasized adverse effects including allergic reactions, nausea, headache. -Continue oral iron .  Use MiraLAX  for constipation. - Continue to follow with GI.   Return to clinic in 2 months with labs   No orders of the defined types were placed in this encounter.   INTERVAL HISTORY: Patient returns for follow-up for iron  deficiency anemia.  He received 3 doses of IV Venofer  on 02/11/2024, 02/19/2024 and 03/22/2024.  Tolerated infusions well.  He is here to review most recent lab results.  Reports he is taking iron  tablets daily with great tolerance.  Denies any constipation.  He does take MiraLAX  as needed.   Reports his energy levels have improved dramatically since receiving IV iron .  He denies any bleeding, bright red blood per rectum, melena or hematochezia.  Evaluated on 05/10/2023 for near syncopal episode.  Cardiac workup was essentially unremarkable.  Reports he will be having a repeat colonoscopy next month.  I have reviewed the past medical history, past surgical history, social history and family history with the patient   SUMMARY OF HEMATOLOGIC HISTORY: Iron  deficiency anemia likely secondary to GI bleeding - S/p IV iron  Venofer  500 mg x 3 doses on 02/11/2024 and 02/19/2024 and 03/22/24.   Lab Results  Component Value Date   HGB 15.0 05/13/2024   FERRITIN 24  05/13/2024   VITAMINB12 310 05/13/2024    Vitals:   05/20/24 1146  BP: (!) 148/84  Pulse: 82  Resp: 16  Temp: 97.6 F (36.4 C)  SpO2: 98%   Review of Systems  Gastrointestinal:  Positive for nausea.  Neurological:  Positive for dizziness and headaches.  Psychiatric/Behavioral:  The patient has insomnia.    Physical Exam Constitutional:      Appearance: Normal appearance.  Cardiovascular:     Rate and Rhythm: Normal rate and regular rhythm.  Pulmonary:     Effort: Pulmonary effort is normal.     Breath sounds: Normal breath sounds.  Abdominal:     General: Bowel sounds are normal.     Palpations: Abdomen is soft.  Musculoskeletal:        General: No swelling. Normal range of motion.  Neurological:     Mental Status: He is alert and oriented to person, place, and time. Mental status is at baseline.     I spent 25 minutes dedicated to the care of this patient (face-to-face and non-face-to-face) on the date of the encounter to include what is described in the assessment and plan.,  Delon Hope, NP 05/20/2024 12:11 PM

## 2024-05-20 NOTE — Assessment & Plan Note (Addendum)
 The most likely cause of his anemia is due to chronic blood loss   Last colonoscopy/endoscopy:  Endoscopy 01/25/2024: Gastric polyps, no active bleeding Colonoscopy: 08/06/2023: No active bleeding Capsule endoscopy results: Pending at this time Status post IV iron -Venofer  500 mg X3 doses.  Labs from 05/13/24 shows improvement of his hemoglobin. Iron  panel is more or less stable with still relatively     -Will administer 1 more doses of IV Venofer  500 mg.  Reemphasized adverse effects including allergic reactions, nausea, headache. -Continue oral iron .  Use MiraLAX  for constipation. - Continue to follow with GI.   Return to clinic in 2 months with labs

## 2024-05-26 ENCOUNTER — Inpatient Hospital Stay

## 2024-05-26 VITALS — BP 152/72 | HR 56 | Temp 96.0°F | Resp 18 | Wt 290.0 lb

## 2024-05-26 DIAGNOSIS — D509 Iron deficiency anemia, unspecified: Secondary | ICD-10-CM

## 2024-05-26 MED ORDER — CETIRIZINE HCL 10 MG PO TABS
10.0000 mg | ORAL_TABLET | Freq: Once | ORAL | Status: AC
  Administered 2024-05-26: 10 mg via ORAL
  Filled 2024-05-26: qty 1

## 2024-05-26 MED ORDER — SODIUM CHLORIDE 0.9 % IV SOLN: Freq: Once | INTRAVENOUS | Status: AC

## 2024-05-26 MED ORDER — ACETAMINOPHEN 325 MG PO TABS
650.0000 mg | ORAL_TABLET | Freq: Once | ORAL | Status: AC
  Administered 2024-05-26: 650 mg via ORAL
  Filled 2024-05-26: qty 2

## 2024-05-26 MED ORDER — IRON SUCROSE 500 MG IVPB - SIMPLE MED
500.0000 mg | Freq: Once | INTRAVENOUS | Status: AC
  Administered 2024-05-26: 500 mg via INTRAVENOUS
  Filled 2024-05-26: qty 500

## 2024-05-26 NOTE — Progress Notes (Signed)
 Patient presents today for iron  infusion.  Patient is in satisfactory condition with no new complaints voiced.  Vital signs are stable.  We will proceed with infusion per provider orders.    Peripheral IV started with good blood return pre and post infusion.   After iron  was done and 30 minute wait time was over. Patient c/o burning in legs from the knee down. Patient voiced no other complaints at this time. Delon Hope, NP  made aware and stated for patient to take a Benadryl  at home prior to bedtime.  Patient waited an additional 10 minutes. Okay to discharge patient per Delon Hope PIETY.   Venofer  500 mg  given today per MD orders. Tolerated infusion without adverse affects. Vital signs stable. Discharged from clinic ambulatory in stable condition. Alert and oriented x 3. F/U with [facility] as scheduled.

## 2024-06-12 ENCOUNTER — Other Ambulatory Visit: Payer: Self-pay | Admitting: "Endocrinology

## 2024-06-12 DIAGNOSIS — E1159 Type 2 diabetes mellitus with other circulatory complications: Secondary | ICD-10-CM

## 2024-06-19 ENCOUNTER — Other Ambulatory Visit: Payer: Self-pay | Admitting: Nurse Practitioner

## 2024-06-23 ENCOUNTER — Telehealth (INDEPENDENT_AMBULATORY_CARE_PROVIDER_SITE_OTHER): Payer: Self-pay

## 2024-06-23 NOTE — Telephone Encounter (Signed)
 Patient had sent message to Glenys Bruns, LPN stating that he wanted to cancel his procedure at the end of this month on 07/01/2024. I attempted to call the patient to verify and got no answer. LVM for call back.

## 2024-06-23 NOTE — Telephone Encounter (Signed)
 Pt called in and cancelled procedure for 9/26. Reports he will try to reschedule at a later date. Message sent to endo

## 2024-07-01 ENCOUNTER — Ambulatory Visit (HOSPITAL_COMMUNITY): Admit: 2024-07-01 | Admitting: Gastroenterology

## 2024-07-01 ENCOUNTER — Encounter (HOSPITAL_COMMUNITY): Payer: Self-pay

## 2024-07-01 SURGERY — COLONOSCOPY
Anesthesia: Choice

## 2024-07-03 ENCOUNTER — Other Ambulatory Visit: Payer: Self-pay | Admitting: Cardiology

## 2024-07-07 ENCOUNTER — Other Ambulatory Visit: Payer: Self-pay | Admitting: Oncology

## 2024-07-07 DIAGNOSIS — D500 Iron deficiency anemia secondary to blood loss (chronic): Secondary | ICD-10-CM

## 2024-07-08 ENCOUNTER — Inpatient Hospital Stay: Attending: Hematology

## 2024-07-08 ENCOUNTER — Encounter (INDEPENDENT_AMBULATORY_CARE_PROVIDER_SITE_OTHER): Payer: Self-pay | Admitting: *Deleted

## 2024-07-08 DIAGNOSIS — D509 Iron deficiency anemia, unspecified: Secondary | ICD-10-CM | POA: Insufficient documentation

## 2024-07-15 ENCOUNTER — Ambulatory Visit: Admitting: Oncology

## 2024-07-17 ENCOUNTER — Emergency Department (HOSPITAL_COMMUNITY)
Admission: EM | Admit: 2024-07-17 | Discharge: 2024-07-17 | Disposition: A | Discharge status: Home / Self Care | Attending: Emergency Medicine | Admitting: Emergency Medicine

## 2024-07-17 ENCOUNTER — Other Ambulatory Visit: Payer: Self-pay

## 2024-07-17 ENCOUNTER — Emergency Department (HOSPITAL_COMMUNITY)

## 2024-07-17 ENCOUNTER — Encounter (HOSPITAL_COMMUNITY): Payer: Self-pay | Admitting: *Deleted

## 2024-07-17 DIAGNOSIS — Z794 Long term (current) use of insulin: Secondary | ICD-10-CM | POA: Insufficient documentation

## 2024-07-17 DIAGNOSIS — Z7982 Long term (current) use of aspirin: Secondary | ICD-10-CM | POA: Insufficient documentation

## 2024-07-17 DIAGNOSIS — R079 Chest pain, unspecified: Secondary | ICD-10-CM | POA: Diagnosis present

## 2024-07-17 DIAGNOSIS — D72829 Elevated white blood cell count, unspecified: Secondary | ICD-10-CM | POA: Diagnosis not present

## 2024-07-17 LAB — BASIC METABOLIC PANEL WITH GFR
Anion gap: 11 (ref 5–15)
BUN: 13 mg/dL (ref 6–20)
CO2: 28 mmol/L (ref 22–32)
Calcium: 10 mg/dL (ref 8.9–10.3)
Chloride: 102 mmol/L (ref 98–111)
Creatinine, Ser: 1.09 mg/dL (ref 0.61–1.24)
GFR, Estimated: >60 mL/min (ref >60)
Glucose, Bld: 124 mg/dL — ABNORMAL HIGH (ref 70–99)
Potassium: 4.1 mmol/L (ref 3.5–5.1)
Sodium: 142 mmol/L (ref 135–145)

## 2024-07-17 LAB — CBC
HCT: 43.4 % (ref 39.0–52.0)
Hemoglobin: 14.4 g/dL (ref 13.0–17.0)
MCH: 29.7 pg (ref 26.0–34.0)
MCHC: 33.2 g/dL (ref 30.0–36.0)
MCV: 89.5 fL (ref 80.0–100.0)
Platelets: 208 K/uL (ref 150–400)
RBC: 4.85 MIL/uL (ref 4.22–5.81)
RDW: 13.5 % (ref 11.5–15.5)
WBC: 11.4 K/uL — ABNORMAL HIGH (ref 4.0–10.5)
nRBC: 0 % (ref 0.0–0.2)

## 2024-07-17 LAB — TROPONIN T, HIGH SENSITIVITY: Troponin T High Sensitivity: 18 ng/L (ref 0–19)

## 2024-07-17 LAB — PRO BRAIN NATRIURETIC PEPTIDE: Pro Brain Natriuretic Peptide: <50 pg/mL (ref <300.0)

## 2024-07-17 NOTE — ED Provider Notes (Signed)
 [place] [facility]MENT AT [facility] Provider Note   CSN: [id number] Arrival date & time: 07/17/24  1336     Patient presents with: Chest Pain (right)   Lance [name] is a 48 y.o. male.  He is complaining of some pain in the right lower chest that began yesterday and then resolved.  Recurred again today.  Also today he noticed he was a little short of breath when laying down flat but not otherwise.  He said he has been in and out of the hospital over the last year for various things including an MI and GI bleeding.  He does not recall any chest pain when he had his MI, said he had a syncopal event.  No fevers chills nausea vomiting.  He does smoke, denies any alcohol  use.  No leg pain or swelling.   The history is provided by the patient.  Chest Pain Pain location:  R chest Pain quality: aching   Pain radiates to:  Does not radiate Pain severity:  Mild Onset quality:  Gradual Duration:  2 days Timing:  Sporadic Progression:  Unchanged Chronicity:  New Relieved by:  None tried Associated symptoms: shortness of breath   Associated symptoms: no cough, no diaphoresis, no dizziness, no fever, no nausea and no vomiting        Prior to Admission medications   Medication Sig Start Date End Date Taking? Authorizing Provider  aspirin  EC 81 MG tablet Take 1 tablet (81 mg total) by mouth daily with breakfast. 04/05/19   Pearlean, Courage, MD  atorvastatin  (LIPITOR ) 80 MG tablet TAKE 1 TABLET BY MOUTH EVERYDAY AT BEDTIME 02/25/24   Debera Jayson MATSU, MD  B-D ULTRAFINE III SHORT PEN 31G X 8 MM MISC USE TO INJECT INSULIN  4 (FOUR) TIMES DAILY. 10/01/23   Nida, Gebreselassie W, MD  blood glucose meter kit and supplies KIT 1 each by Does not apply route 4 (four) times daily. Dispense based on patient and insurance preference. Use up to four times daily as directed. (FOR ICD-9 250.00, 250.01). 02/14/20   Nida, Ethelle ORN, MD  Continuous Glucose Sensor (DEXCOM G7 SENSOR) MISC  CHANGE SENSOR EVERY 10 DAYS 06/13/24   Nida, Gebreselassie W, MD  docusate sodium  (COLACE) 100 MG capsule Take 1 capsule (100 mg total) by mouth 2 (two) times daily. 01/25/24 01/24/25  Maree, Pratik D, DO  famotidine  (PEPCID ) 40 MG tablet Take 40 mg by mouth daily. 04/30/23   [provider]  ferrous sulfate  325 (65 FE) MG EC tablet Take 1 tablet (325 mg total) by mouth 2 (two) times daily. 05/11/24 05/11/25  Ahmed, Muhammad F, MD  insulin  aspart (NOVOLOG  FLEXPEN) 100 UNIT/ML FlexPen INJECT 20-26 UNITS INTO THE SKIN 3 (THREE) TIMES DAILY WITH MEALS. 02/25/24   Nida, Gebreselassie W, MD  insulin  glargine-yfgn (SEMGLEE ) 100 UNIT/ML Pen INJECT 80 UNITS INTO THE SKIN AT BEDTIME. 02/25/24   Nida, Gebreselassie W, MD  LANTUS  SOLOSTAR 100 UNIT/ML Solostar Pen Inject 80 Units into the skin at bedtime. 04/17/24   [provider]  lisinopril  (ZESTRIL ) 40 MG tablet TAKE 1 TABLET BY MOUTH EVERY DAY 07/05/24   Debera Jayson MATSU, MD  nitroGLYCERIN  (NITROSTAT ) 0.4 MG SL tablet Place 1 tablet (0.4 mg total) under the tongue every 5 (five) minutes x 3 doses as needed for chest pain. 09/02/21   Debera Jayson MATSU, MD  OZEMPIC , 1 MG/DOSE, 4 MG/3ML SOPN Inject 1 mg into the skin once a week. 04/17/24   [provider]  pantoprazole  (PROTONIX ) 40 MG tablet Take 40 mg by mouth at bedtime. 01/04/24   [provider]  Sodium Sulfate-Mag Sulfate-KCl (SUTAB ) [phone] MG TABS Take 12 tablets by mouth as directed. 05/13/24   Ahmed, Deatrice FALCON, MD  ticagrelor  (BRILINTA ) 90 MG TABS tablet Take 90 mg by mouth 2 (two) times daily. 03/16/24   [provider]    Allergies: Bee venom and Penicillins    Review of Systems  Constitutional:  Negative for diaphoresis and fever.  Respiratory:  Positive for shortness of breath. Negative for cough.   Cardiovascular:  Positive for chest pain.  Gastrointestinal:  Negative for nausea and vomiting.  Neurological:  Negative for dizziness.    Updated Vital  Signs BP (!) 167/73   Pulse 63   Temp (!) 97.2 F (36.2 C) (Temporal)   Resp 18   Ht 5' 11 (1.803 m)   Wt 131.5 kg   SpO2 100%   BMI 40.45 kg/m   Physical Exam Vitals and nursing note reviewed.  Constitutional:      Appearance: Normal appearance. He is well-developed.  HENT:     Head: Normocephalic and atraumatic.  Eyes:     Conjunctiva/sclera: Conjunctivae normal.  Cardiovascular:     Rate and Rhythm: Normal rate and regular rhythm.     Heart sounds: Normal heart sounds. No murmur heard. Pulmonary:     Effort: Pulmonary effort is normal. No respiratory distress.     Breath sounds: Normal breath sounds.  Abdominal:     Palpations: Abdomen is soft.     Tenderness: There is no abdominal tenderness. There is no guarding or rebound.  Musculoskeletal:     Cervical back: Neck supple.     Right lower leg: No edema.     Left lower leg: No edema.  Skin:    General: Skin is warm and dry.  Neurological:     General: No focal deficit present.     Mental Status: He is alert.     GCS: GCS eye subscore is 4. GCS verbal subscore is 5. GCS motor subscore is 6.     Gait: Gait normal.     (all labs ordered are listed, but only abnormal results are displayed) Labs Reviewed  BASIC METABOLIC PANEL WITH GFR - Abnormal; Notable for the following components:      Result Value   Glucose, Bld 124 (*)    All other components within normal limits  CBC - Abnormal; Notable for the following components:   WBC 11.4 (*)    All other components within normal limits  PRO BRAIN NATRIURETIC PEPTIDE  TROPONIN T, HIGH SENSITIVITY    EKG: EKG Interpretation Date/Time:  Sunday July 17 2024 13:41:07 EDT Ventricular Rate:  72 PR Interval:  194 QRS Duration:  100 QT Interval:  390 QTC Calculation: 427 R Axis:   115  Text Interpretation: Normal sinus rhythm Right axis deviation Incomplete right bundle branch block Abnormal ECG When compared with ECG of 09-May-2024 11:47, No significant change  since last tracing Confirmed by Towana Sharper [phone]) on 07/17/2024 1:42:28 PM  Radiology: DG Chest 2 View Result Date: 07/17/2024 CLINICAL DATA:  Right-sided chest pain. EXAM: CHEST - 2 VIEW COMPARISON:  May 09, 2024 FINDINGS: The heart size and mediastinal contours are within normal limits. Both lungs are clear. Radiopaque surgical clips are seen within the right upper quadrant. The visualized skeletal structures are unremarkable. IMPRESSION: No active cardiopulmonary disease. Electronically Signed   By: Suzen Dwane HERO.D.  On: 07/17/2024 14:09     Procedures   Medications Ordered in the ED - No data to display  Clinical Course as of 07/17/24 1711  Sun Jul 17, 2024  1402 Chest x-ray interpreted by me as no acute infiltrate.  Awaiting radiology reading. [MB]  1443 Patient is having no pain here now and vitals have been stable.  His workup has been unremarkable.  He has a follow-up appointment with cardiology on Tuesday.  He is comfortable plan for discharge and outpatient follow-up with cardiology.  Return instructions discussed [MB]    Clinical Course User Index [MB] Towana Ozell BROCKS, MD                                 Medical Decision Making Amount and/or Complexity of Data Reviewed Labs: ordered. Radiology: ordered.   This patient complains of intermittent right sided chest pain, shortness of breath when he lays down; this involves an extensive number of treatment Options and is a complaint that carries with it a high risk of complications and morbidity. The differential includes musculoskeletal pain, ACS, pneumonia, pneumothorax, PE, vascular  I ordered, reviewed and interpreted labs, which included CBC with mildly elevated white count, chemistries unremarkable, troponin flat BNP undetectable I ordered imaging studies which included chest x-ray and I independently    visualized and interpreted imaging which showed no acute findings Previous records obtained and  reviewed in epic including recent GI and cardiology Cardiac monitoring reviewed, sinus rhythm Social determinants considered, no significant barriers Critical Interventions: None  After the interventions stated above, I reevaluated the patient and found patient be resting comfortably with stable vitals in no distress Admission and further testing considered, he has an appointment with cardiology coming up in 2 days.  He otherwise feels well now.  Feel it is reasonable that he can follow-up with them and they can make adjustments as needed.  Return instructions discussed.      Final diagnoses:  Nonspecific chest pain    ED Discharge Orders     None          Towana Ozell BROCKS, MD 07/17/24 [phone]

## 2024-07-17 NOTE — ED Notes (Signed)
 Denies sx or complaints, VSS. Alert, NAD, calm, interactive, resps e/u, speaking in clear complete sentences, skin W&D. Reports sx that he experience upon waking R sided CP below lower anterior ribs and some sob has resolved. Denies OSA.

## 2024-07-17 NOTE — ED Triage Notes (Signed)
 Pt with right sided CP that started today. + SOB

## 2024-07-17 NOTE — Discharge Instructions (Signed)
 You are seen in the emergency department with complaint of some right sided chest pain.  You had lab work EKG and chest x-ray that did not show an obvious explanation for your symptoms.  Please follow-up with your cardiologist on Tuesday as scheduled.  Continue your regular medications.  Return to the emergency department if any worsening or concerning symptoms

## 2024-07-19 ENCOUNTER — Encounter: Payer: Self-pay | Admitting: Cardiology

## 2024-07-19 ENCOUNTER — Ambulatory Visit: Attending: Cardiology | Admitting: Cardiology

## 2024-07-19 VITALS — BP 140/84 | HR 69 | Ht 70.0 in | Wt 293.0 lb

## 2024-07-19 DIAGNOSIS — I10 Essential (primary) hypertension: Secondary | ICD-10-CM

## 2024-07-19 DIAGNOSIS — E782 Mixed hyperlipidemia: Secondary | ICD-10-CM

## 2024-07-19 DIAGNOSIS — I25119 Atherosclerotic heart disease of native coronary artery with unspecified angina pectoris: Secondary | ICD-10-CM | POA: Diagnosis not present

## 2024-07-19 DIAGNOSIS — I4719 Other supraventricular tachycardia: Secondary | ICD-10-CM | POA: Diagnosis not present

## 2024-07-19 MED ORDER — TICAGRELOR 60 MG PO TABS: 60.0000 mg | ORAL_TABLET | Freq: Two times a day (BID) | ORAL | 6 refills | Status: AC

## 2024-07-19 NOTE — Patient Instructions (Addendum)
 Medication Instructions:  Decrease Brilinta  to 60mg  twice a day   Continue all other medications.    Labwork: none  Testing/Procedures: none  Follow-Up: Your physician recommends that you schedule a follow-up appointment in:  3 months   Any Other Special Instructions Will Be Listed Below (If Applicable).  If you need a refill on your cardiac medications before your next appointment, please call your pharmacy.o -

## 2024-07-19 NOTE — Progress Notes (Signed)
    Cardiology Office Note  Date: 07/19/2024   ID: Lance [name], DOB 05-20-1976, MRN [id number]  History of Present Illness: Lance [name] is a 48 y.o. male last seen in May.  He is here for a follow-up visit.  Record review finds evaluation at [facility] ER on October 12 for chest discomfort.  ECG showed no acute ST segment changes, chest x-ray identified no acute cardiopulmonary disease, and high-sensitivity troponin T level was normal.  He was evaluated by Dr. Towana and discharged for outpatient follow-up.  We discussed his symptoms today.  He describes a fairly focal, right lower costal sharp discomfort that started over the weekend, recurred on the day of evaluation, subsequently resolved.  Reports recently getting over chest congestion, possible URI in the last 2 weeks.  No fevers or chills.  Indicates that the symptoms do not feel like his prior angina.  I reviewed his cardiac medications which are stable.  We discussed reducing his Brilinta  dose to 60 mg twice daily at this point.  Physical Exam: VS:  BP (!) 140/84   Pulse 69   Ht 5' 10 (1.778 m)   Wt 293 lb (132.9 kg)   SpO2 96%   BMI 42.04 kg/m , BMI Body mass index is 42.04 kg/m.  Wt Readings from Last 3 Encounters:  07/19/24 293 lb (132.9 kg)  07/17/24 290 lb (131.5 kg)  05/26/24 290 lb (131.5 kg)    General: Patient appears comfortable at rest. HEENT: Conjunctiva and lids normal. Neck: Supple, no elevated JVP or carotid bruits. Lungs: Clear to auscultation, nonlabored breathing at rest. Cardiac: Regular rate and rhythm, no S3, 2/6 systolic murmur. Extremities: No pitting edema.  ECG:  An ECG dated 07/17/2024 was personally reviewed today and demonstrated:  Sinus rhythm with R' in lead V1.  Labwork: October 2024: Cholesterol 130, triglycerides 191, HDL 24, LDL 73 01/24/2024: B Natriuretic Peptide 19.0 05/13/2024: ALT 30; AST 21 07/17/2024: BUN 13; Creatinine, Ser 1.09; Hemoglobin 14.4; Platelets 208; Potassium  4.1; Pro Brain Natriuretic Peptide <50.0; Sodium 142   Other Studies Reviewed Today:  No interval cardiac testing for review today.  Assessment and Plan:  1.  CAD status post DES to the mid RCA and OM in October 2018 with documented occlusion of the distal circumflex associated with collaterals.  He underwent subsequent DES to the proximal LAD and angioplasty with stent intervention to the first obtuse marginal and mid circumflex in June 2020.  Most recently underwent cardiac catheterization in April 2024 in the setting of unstable angina and underwent atherectomy to the proximal LAD for treatment of in-stent restenosis.  Recent chest pain atypical in description, possibly inflammatory with recent URI symptoms that are improving.  Chest x-ray showed no acute findings, high-sensitivity troponin T normal.  For now we will continue with medical therapy.  Continue aspirin  81 mg daily, decrease Brilinta  to 60 mg twice daily, Lipitor  80 mg daily.   2.  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  No palpitations.   3.  Mixed hyperlipidemia, LDL 73 in October 2024.  Continue Lipitor  80 mg daily.   4.  Primary hypertension.  Continue lisinopril  40 mg daily.  Disposition:  Follow up 3 months.  Signed, Lance [name], M.D., F.A.C.C. [facility]

## 2024-07-20 ENCOUNTER — Other Ambulatory Visit: Payer: Self-pay | Admitting: "Endocrinology

## 2024-07-20 DIAGNOSIS — E1165 Type 2 diabetes mellitus with hyperglycemia: Secondary | ICD-10-CM

## 2024-08-04 ENCOUNTER — Inpatient Hospital Stay

## 2024-08-04 DIAGNOSIS — D509 Iron deficiency anemia, unspecified: Secondary | ICD-10-CM | POA: Diagnosis present

## 2024-08-04 DIAGNOSIS — D500 Iron deficiency anemia secondary to blood loss (chronic): Secondary | ICD-10-CM

## 2024-08-04 LAB — CBC WITH DIFFERENTIAL/PLATELET
Abs Immature Granulocytes: 0.03 K/uL (ref 0.00–0.07)
Basophils Absolute: 0.1 K/uL (ref 0.0–0.1)
Basophils Relative: 1 %
Eosinophils Absolute: 0.4 K/uL (ref 0.0–0.5)
Eosinophils Relative: 4 %
HCT: 43.2 % (ref 39.0–52.0)
Hemoglobin: 14.2 g/dL (ref 13.0–17.0)
Immature Granulocytes: 0 %
Lymphocytes Relative: 33 %
Lymphs Abs: 3.2 K/uL (ref 0.7–4.0)
MCH: 29.3 pg (ref 26.0–34.0)
MCHC: 32.9 g/dL (ref 30.0–36.0)
MCV: 89.1 fL (ref 80.0–100.0)
Monocytes Absolute: 0.8 K/uL (ref 0.1–1.0)
Monocytes Relative: 8 %
Neutro Abs: 5.1 K/uL (ref 1.7–7.7)
Neutrophils Relative %: 54 %
Platelets: 231 K/uL (ref 150–400)
RBC: 4.85 MIL/uL (ref 4.22–5.81)
RDW: 13.4 % (ref 11.5–15.5)
WBC: 9.5 K/uL (ref 4.0–10.5)
nRBC: 0 % (ref 0.0–0.2)

## 2024-08-04 LAB — COMPREHENSIVE METABOLIC PANEL WITH GFR
ALT: 31 U/L (ref 0–44)
AST: 25 U/L (ref 15–41)
Albumin: 4.4 g/dL (ref 3.5–5.0)
Alkaline Phosphatase: 105 U/L (ref 38–126)
Anion gap: 10 (ref 5–15)
BUN: 14 mg/dL (ref 6–20)
CO2: 28 mmol/L (ref 22–32)
Calcium: 9.4 mg/dL (ref 8.9–10.3)
Chloride: 102 mmol/L (ref 98–111)
Creatinine, Ser: 0.99 mg/dL (ref 0.61–1.24)
GFR, Estimated: >60 mL/min (ref >60)
Glucose, Bld: 156 mg/dL — ABNORMAL HIGH (ref 70–99)
Potassium: 4.6 mmol/L (ref 3.5–5.1)
Sodium: 140 mmol/L (ref 135–145)
Total Bilirubin: 0.6 mg/dL (ref 0.0–1.2)
Total Protein: 6.8 g/dL (ref 6.5–8.1)

## 2024-08-04 LAB — VITAMIN B12: Vitamin B-12: 490 pg/mL (ref 180–914)

## 2024-08-04 LAB — IRON AND TIBC
Iron: 53 ug/dL (ref 45–182)
Saturation Ratios: 13 % — ABNORMAL LOW (ref 17.9–39.5)
TIBC: 399 ug/dL (ref 250–450)
UIBC: 346 ug/dL

## 2024-08-04 LAB — FOLATE: Folate: 11 ng/mL (ref >5.9)

## 2024-08-04 LAB — FERRITIN: Ferritin: 20 ng/mL — ABNORMAL LOW (ref 24–336)

## 2024-08-07 ENCOUNTER — Other Ambulatory Visit (INDEPENDENT_AMBULATORY_CARE_PROVIDER_SITE_OTHER): Payer: Self-pay | Admitting: Gastroenterology

## 2024-08-11 ENCOUNTER — Inpatient Hospital Stay: Attending: Hematology | Admitting: Oncology

## 2024-08-11 VITALS — BP 133/62 | HR 82 | Temp 98.3°F | Resp 18 | Wt 290.0 lb

## 2024-08-11 DIAGNOSIS — D500 Iron deficiency anemia secondary to blood loss (chronic): Secondary | ICD-10-CM

## 2024-08-11 NOTE — Progress Notes (Signed)
 [name] OFFICE PROGRESS NOTE  Dow Longs, PA-C  ASSESSMENT & PLAN:  Assessment & Plan Iron  deficiency anemia due to chronic blood loss The most likely cause of his anemia is due to chronic blood loss   Last colonoscopy/endoscopy:  Endoscopy 01/25/2024: Gastric polyps, no active bleeding Colonoscopy: 08/06/2023: No active bleeding Capsule endoscopy results: Pending at this time Status post IV iron -Venofer  500 mg X4 doses.  Labs from 08/04/2024 show hemoglobin 14.2 with normal differential, normal CMP, ferritin 20, iron  saturation 13% and B12 490.  Folate is normal. Reports he has more or less tolerated IV Venofer  500 mg but does have aching following his infusion.  He is wondering if we can decrease the dose to see if it helps with the achiness following his infusion.  -Will administer 2 more doses of 300 mg IV Venofer  given fatigue and achiness following the 500 mg IV Venofer .  Otherwise tolerated infusion well. - Recommend he continue oral iron  and use MiraLAX  for constipation. - Arrange for follow-up with GI given he continues to drop.  Return to clinic in 4 months with labs   Orders Placed This Encounter  Procedures   Folate    Standing Status:   Future    Expected Date:   11/11/2024    Expiration Date:   02/09/2025   CBC with Differential/Platelet    Standing Status:   Future    Expected Date:   11/11/2024    Expiration Date:   02/09/2025   Comprehensive metabolic panel with GFR    Standing Status:   Future    Expected Date:   11/11/2024    Expiration Date:   02/09/2025   Vitamin B12    Standing Status:   Future    Expected Date:   11/11/2024    Expiration Date:   02/09/2025   Ferritin    Standing Status:   Future    Expected Date:   11/11/2024    Expiration Date:   02/09/2025   Iron  and TIBC    Standing Status:   Future    Expected Date:   11/11/2024    Expiration Date:   02/09/2025    INTERVAL HISTORY: Patient returns for follow-up for iron  deficiency anemia.   He received 3 doses of IV Venofer  on 02/11/2024, 02/19/2024 and 03/22/2024.  Tolerated infusions well.  He is here to review most recent lab results.  Reports he is taking iron  tablets daily with great tolerance.  Denies any constipation.  He does take MiraLAX  as needed.   Reports his energy levels have improved dramatically since receiving IV iron .  He denies any bleeding, bright red blood per rectum, melena or hematochezia.  Evaluated on 05/10/2023 for near syncopal episode.  Cardiac workup was essentially unremarkable.  Reports he will be having a repeat colonoscopy next month.  I have reviewed the past medical history, past surgical history, social history and family history with the patient   SUMMARY OF HEMATOLOGIC HISTORY: Iron  deficiency anemia likely secondary to GI bleeding - S/p IV iron  Venofer  500 mg x 3 doses on 02/11/2024 and 02/19/2024 and 03/22/24.   Lab Results  Component Value Date   HGB 14.2 08/04/2024   FERRITIN 20 (L) 08/04/2024   VITAMINB12 490 08/04/2024    Vitals:   08/11/24 1351  BP: 133/62  Pulse: 82  Resp: 18  Temp: 98.3 F (36.8 C)  SpO2: 100%    Review of Systems  Gastrointestinal:  Positive for nausea.  Neurological:  Positive for dizziness and headaches.  Psychiatric/Behavioral:  The patient has insomnia.    Physical Exam Constitutional:      Appearance: Normal appearance.  Cardiovascular:     Rate and Rhythm: Normal rate and regular rhythm.  Pulmonary:     Effort: Pulmonary effort is normal.     Breath sounds: Normal breath sounds.  Abdominal:     General: Bowel sounds are normal.     Palpations: Abdomen is soft.  Musculoskeletal:        General: No swelling. Normal range of motion.  Neurological:     Mental Status: He is alert and oriented to person, place, and time. Mental status is at baseline.     I spent 25 minutes dedicated to the care of this patient (face-to-face and non-face-to-face) on the date of the encounter to include what  is described in the assessment and plan.,  Delon Hope, NP 08/11/2024 3:13 PM

## 2024-08-11 NOTE — Assessment & Plan Note (Addendum)
 The most likely cause of his anemia is due to chronic blood loss   Last colonoscopy/endoscopy:  Endoscopy 01/25/2024: Gastric polyps, no active bleeding Colonoscopy: 08/06/2023: No active bleeding Capsule endoscopy results: Pending at this time Status post IV iron -Venofer  500 mg X4 doses.  Labs from 08/04/2024 show hemoglobin 14.2 with normal differential, normal CMP, ferritin 20, iron  saturation 13% and B12 490.  Folate is normal. Reports he has more or less tolerated IV Venofer  500 mg but does have aching following his infusion.  He is wondering if we can decrease the dose to see if it helps with the achiness following his infusion.  -Will administer 2 more doses of 300 mg IV Venofer  given fatigue and achiness following the 500 mg IV Venofer .  Otherwise tolerated infusion well. - Recommend he continue oral iron  and use MiraLAX  for constipation. - Arrange for follow-up with GI given he continues to drop.  Return to clinic in 4 months with labs

## 2024-08-13 ENCOUNTER — Other Ambulatory Visit: Payer: Self-pay | Admitting: "Endocrinology

## 2024-08-18 ENCOUNTER — Inpatient Hospital Stay

## 2024-08-19 ENCOUNTER — Inpatient Hospital Stay

## 2024-08-19 VITALS — BP 133/69 | HR 83 | Temp 96.7°F | Resp 18

## 2024-08-19 DIAGNOSIS — D509 Iron deficiency anemia, unspecified: Secondary | ICD-10-CM

## 2024-08-19 MED ORDER — METHYLPREDNISOLONE SODIUM SUCC 125 MG IJ SOLR
125.0000 mg | Freq: Once | INTRAMUSCULAR | Status: AC
  Administered 2024-08-19: 125 mg via INTRAVENOUS
  Filled 2024-08-19: qty 2

## 2024-08-19 MED ORDER — ACETAMINOPHEN 325 MG PO TABS
650.0000 mg | ORAL_TABLET | Freq: Once | ORAL | Status: AC
  Administered 2024-08-19: 650 mg via ORAL
  Filled 2024-08-19: qty 2

## 2024-08-19 MED ORDER — SODIUM CHLORIDE 0.9 % IV SOLN: INTRAVENOUS | Status: AC

## 2024-08-19 MED ORDER — IRON SUCROSE 300 MG IVPB - SIMPLE MED
300.0000 mg | Freq: Once | Status: AC
  Administered 2024-08-19: 300 mg via INTRAVENOUS
  Filled 2024-08-19: qty 300

## 2024-08-19 MED ORDER — CETIRIZINE HCL 10 MG PO TABS
10.0000 mg | ORAL_TABLET | Freq: Once | ORAL | Status: AC
  Administered 2024-08-19: 10 mg via ORAL
  Filled 2024-08-19: qty 1

## 2024-08-19 NOTE — Patient Instructions (Signed)

## 2024-08-25 ENCOUNTER — Encounter: Payer: Self-pay | Admitting: "Endocrinology

## 2024-08-25 ENCOUNTER — Ambulatory Visit: Admitting: "Endocrinology

## 2024-08-25 VITALS — BP 136/58 | HR 88 | Ht 70.0 in | Wt 291.6 lb

## 2024-08-25 DIAGNOSIS — E1159 Type 2 diabetes mellitus with other circulatory complications: Secondary | ICD-10-CM

## 2024-08-25 DIAGNOSIS — E1165 Type 2 diabetes mellitus with hyperglycemia: Secondary | ICD-10-CM

## 2024-08-25 DIAGNOSIS — Z794 Long term (current) use of insulin: Secondary | ICD-10-CM

## 2024-08-25 DIAGNOSIS — I10 Essential (primary) hypertension: Secondary | ICD-10-CM | POA: Diagnosis not present

## 2024-08-25 DIAGNOSIS — E782 Mixed hyperlipidemia: Secondary | ICD-10-CM

## 2024-08-25 LAB — POCT GLYCOSYLATED HEMOGLOBIN (HGB A1C): HbA1c, POC (controlled diabetic range): 7.9 % — AB (ref 0.0–7.0)

## 2024-08-25 MED ORDER — SEMAGLUTIDE (2 MG/DOSE) 8 MG/3ML ~~LOC~~ SOPN: 2.0000 mg | PEN_INJECTOR | SUBCUTANEOUS | 1 refills | Status: AC

## 2024-08-25 NOTE — Patient Instructions (Signed)

## 2024-08-25 NOTE — Progress Notes (Signed)
 08/25/2024  Endocrinology follow-up note  Subjective:    Patient ID: Lance [name], male   DOB: July 25, 1976.  [name] is being seen in follow-up for management of currently uncontrolled type 2 diabetes, complicated by coronary artery disease recently which required stent placement, hyperlipidemia, hypertension.   Past Medical History:  Diagnosis Date   CAD (coronary artery disease), native coronary artery    10/18 PCI/DES to mRCA, and OM, with total occlusion of dLCx with collaterals.    Essential hypertension    GERD (gastroesophageal reflux disease)    History of kidney stones    Hyperlipemia    MI (myocardial infarction) (HCC) 2020   SVT (supraventricular tachycardia) 2015   Converted with adenosine    Type 2 diabetes mellitus (HCC)    Past Surgical History:  Procedure Laterality Date   BIOPSY  08/06/2023   Procedure: BIOPSY;  Surgeon: Cinderella Deatrice FALCON, MD;  Location: [facility];  Service: Endoscopy;;   CARDIAC SURGERY     COLONOSCOPY WITH PROPOFOL  N/A 08/06/2023   Procedure: COLONOSCOPY WITH PROPOFOL ;  Surgeon: Cinderella Deatrice FALCON, MD;  Location: [facility];  Service: Endoscopy;  Laterality: N/A;   CORONARY ANGIOPLASTY WITH STENT PLACEMENT  07/15/2017   2 stents   CORONARY BALLOON ANGIOPLASTY N/A 01/16/2023   Procedure: CORONARY BALLOON ANGIOPLASTY;  Surgeon: Burnard Debby LABOR, MD;  Location: [facility];  Service: Cardiovascular;  Laterality: N/A;   CORONARY/GRAFT ACUTE MI REVASCULARIZATION N/A 03/12/2019   Procedure: Coronary/Graft Acute MI Revascularization;  Surgeon: Mady Bruckner, MD;  Location: [facility];  Service: Cardiovascular;  Laterality: N/A;   ESOPHAGOGASTRODUODENOSCOPY N/A 01/25/2024   Procedure: EGD (ESOPHAGOGASTRODUODENOSCOPY);  Surgeon: Cindie Carlin POUR, DO;  [facility]: [facility];  Service: Endoscopy;  Laterality: N/A;   ESOPHAGOGASTRODUODENOSCOPY (EGD) WITH PROPOFOL  N/A 08/06/2023   Procedure: ESOPHAGOGASTRODUODENOSCOPY (EGD) WITH  PROPOFOL ;  Surgeon: Cinderella Deatrice FALCON, MD;  [facility]: [facility];  Service: Endoscopy;  Laterality: N/A;  1:30pm;asa 3   GIVENS CAPSULE STUDY N/A 03/07/2024   Procedure: IMAGING PROCEDURE, GI TRACT, INTRALUMINAL, VIA CAPSULE;  Surgeon: Cinderella Deatrice FALCON, MD;  Location: [facility];  Service: Endoscopy;  Laterality: N/A;  7:30AM;GIVENS   HEMOSTASIS CLIP PLACEMENT  08/06/2023   Procedure: HEMOSTASIS CLIP PLACEMENT;  Surgeon: Cinderella Deatrice FALCON, MD;  [facility]: [facility];  Service: Endoscopy;;   LAPAROSCOPIC APPENDECTOMY N/A 11/12/2017   Procedure: APPENDECTOMY LAPAROSCOPIC, REPAIR OF INCARCERATED INCISIONAL HERNIA;  Surgeon: Sheldon Standing, MD;  Location: [facility];  Service: General;  Laterality: N/A;   LAPAROSCOPIC CHOLECYSTECTOMY     LEFT HEART CATH AND CORONARY ANGIOGRAPHY N/A 07/15/2017   Procedure: LEFT HEART CATH AND CORONARY ANGIOGRAPHY;  Surgeon: Wonda Sharper, MD;  Location: [facility];  Service: Cardiovascular;  Laterality: N/A;   LEFT HEART CATH AND CORONARY ANGIOGRAPHY N/A 03/12/2019   Procedure: LEFT HEART CATH AND CORONARY ANGIOGRAPHY;  Surgeon: Mady Bruckner, MD;  [facility]: [facility];  Service: Cardiovascular;  Laterality: N/A;   LEFT HEART CATH AND CORONARY ANGIOGRAPHY N/A 01/16/2023   Procedure: LEFT HEART CATH AND CORONARY ANGIOGRAPHY;  Surgeon: Burnard Debby LABOR, MD;  [facility]: [facility];  Service: Cardiovascular;  Laterality: N/A;   POLYPECTOMY  08/06/2023   Procedure: POLYPECTOMY INTESTINAL;  Surgeon: Cinderella Deatrice FALCON, MD;  [facility]: [facility];  Service: Endoscopy;;   SVT ABLATION N/A 04/22/2021   Procedure: SVT ABLATION;  Surgeon: Waddell Danelle ORN, MD;  [facility]: [facility];  Service: Cardiovascular;  Laterality: N/A;  Social History   Socioeconomic History   Marital status: Married    Spouse name: Not on file   Number of children: Not on file   Years of education: Not on file   Highest education level: Not on file  Occupational  History   Occupation: Superintendent    Employer: [employer]  Tobacco Use   Smoking status: Former    Current packs/day: 0.00    Average packs/day: 0.3 packs/day for 26.0 years (6.5 ttl pk-yrs)    Types: Cigarettes    Start date: 03/06/1993    Quit date: 03/07/2019    Years since quitting: 5.4    Passive exposure: Never   Smokeless tobacco: Never  Vaping Use   Vaping status: Never Used  Substance and Sexual Activity   Alcohol  use: No   Drug use: No   Sexual activity: Yes    Birth control/protection: None  Other Topics Concern   Not on file  Social History Narrative   Not on file   Social Drivers of Health   Financial Resource Strain: Not on file  Food Insecurity: No Food Insecurity (02/04/2024)   Hunger Vital Sign    Worried About Running Out of Food in the Last Year: Never true    Ran Out of Food in the Last Year: Never true  Transportation Needs: No Transportation Needs (02/04/2024)   PRAPARE - Administrator, Civil Service (Medical): No    Lack of Transportation (Non-Medical): No  Physical Activity: Not on file  Stress: Not on file  Social Connections: Moderately Isolated (01/24/2024)   Social Connection and Isolation Panel    Frequency of Communication with Friends and Family: More than three times a week    Frequency of Social Gatherings with Friends and Family: More than three times a week    Attends Religious Services: Never    Database Administrator or Organizations: No    Attends Banker Meetings: Never    Marital Status: Married   Outpatient Encounter Medications as of 08/25/2024  Medication Sig   Semaglutide , 2 MG/DOSE, 8 MG/3ML SOPN Inject 2 mg as directed once a week.   aspirin  EC 81 MG tablet Take 1 tablet (81 mg total) by mouth daily with breakfast.   atorvastatin  (LIPITOR ) 80 MG tablet TAKE 1 TABLET BY MOUTH EVERYDAY AT BEDTIME   B-D ULTRAFINE III SHORT PEN 31G X 8 MM MISC USE TO INJECT INSULIN  4 (FOUR) TIMES DAILY.   blood  glucose meter kit and supplies KIT 1 each by Does not apply route 4 (four) times daily. Dispense based on patient and insurance preference. Use up to four times daily as directed. (FOR ICD-9 250.00, 250.01).   Continuous Glucose Sensor (DEXCOM G7 SENSOR) MISC CHANGE SENSOR EVERY 10 DAYS   docusate sodium  (COLACE) 100 MG capsule Take 1 capsule (100 mg total) by mouth 2 (two) times daily.   famotidine  (PEPCID ) 40 MG tablet Take 40 mg by mouth daily.   ferrous sulfate  325 (65 FE) MG EC tablet TAKE 1 TABLET BY MOUTH TWICE A DAY   insulin  aspart (NOVOLOG  FLEXPEN) 100 UNIT/ML FlexPen INJECT 20-26 UNITS INTO THE SKIN 3 (THREE) TIMES DAILY WITH MEALS.   insulin  glargine (LANTUS  SOLOSTAR) 100 UNIT/ML Solostar Pen INJECT 80 UNITS INTO THE SKIN AT BEDTIME.   LANTUS  SOLOSTAR 100 UNIT/ML Solostar Pen Inject 80 Units into the skin at bedtime.   lisinopril  (ZESTRIL ) 40 MG tablet TAKE 1 TABLET BY MOUTH EVERY DAY   nitroGLYCERIN  (  NITROSTAT ) 0.4 MG SL tablet Place 1 tablet (0.4 mg total) under the tongue every 5 (five) minutes x 3 doses as needed for chest pain.   pantoprazole  (PROTONIX ) 40 MG tablet Take 40 mg by mouth at bedtime.   Sodium Sulfate-Mag Sulfate-KCl (SUTAB ) 1479-225-188 MG TABS Take 12 tablets by mouth as directed.   ticagrelor  (BRILINTA ) 60 MG TABS tablet Take 1 tablet (60 mg total) by mouth 2 (two) times daily.   [DISCONTINUED] OZEMPIC , 1 MG/DOSE, 4 MG/3ML SOPN Inject 1 mg into the skin once a week.   Facility-Administered Encounter Medications as of 08/25/2024  Medication   0.9 %  sodium chloride  infusion   ALLERGIES: Allergies  Allergen Reactions   Bee Venom Anaphylaxis and Swelling   Penicillins Hives, Itching and Rash   VACCINATION STATUS:  There is no immunization history on file for this patient.  Diabetes He presents for his follow-up diabetic visit. He has type 2 diabetes mellitus. Onset time: He was diagnosed at approximate age of 35 years. His disease course has been worsening.  There are no hypoglycemic associated symptoms. Pertinent negatives for hypoglycemia include no confusion, headaches, pallor or seizures. Pertinent negatives for diabetes include no blurred vision, no chest pain, no fatigue, no polydipsia, no polyphagia, no polyuria and no weakness. There are no hypoglycemic complications. Symptoms are improving. Diabetic complications include heart disease. Risk factors for coronary artery disease include diabetes mellitus, dyslipidemia, family history, hypertension, male sex, obesity, tobacco exposure and sedentary lifestyle. Current diabetic treatment includes intensive insulin  program. His weight is fluctuating minimally. He is following a generally unhealthy diet. When asked about meal planning, he reported none. He has not had a previous visit with a dietitian. He rarely participates in exercise. His home blood glucose trend is increasing steadily. His breakfast blood glucose range is generally 180-200 mg/dl. His lunch blood glucose range is generally 180-200 mg/dl. His dinner blood glucose range is generally 180-200 mg/dl. His bedtime blood glucose range is generally 180-200 mg/dl. His overall blood glucose range is 180-200 mg/dl. Schoon presents with his CGM showing 50% time in range, 43% level 1 hyperglycemia, 7% level 2 hyperglycemia.  His average blood glucose 183 mg per DL, point-of-care J8r 2.0% increasing from 7.1% during his last visit.   He did not document any hypoglycemia. ) An ACE inhibitor/angiotensin II receptor blocker is being taken. He does not see a podiatrist.Eye exam is not current.  Hyperlipidemia This is a chronic problem. The current episode started more than 1 year ago. The problem is controlled. Exacerbating diseases include diabetes and obesity. Pertinent negatives include no chest pain, myalgias or shortness of breath. Current antihyperlipidemic treatment includes statins and bile acid squestrants. The current treatment provides significant  improvement of lipids. Risk factors for coronary artery disease include diabetes mellitus, dyslipidemia, hypertension, male sex, obesity and a sedentary lifestyle.  Hypertension This is a chronic problem. The current episode started more than 1 year ago. Pertinent negatives include no blurred vision, chest pain, headaches, neck pain, palpitations or shortness of breath. Risk factors for coronary artery disease include dyslipidemia, diabetes mellitus, male gender, smoking/tobacco exposure and sedentary lifestyle. Past treatments include ACE inhibitors. Hypertensive end-organ damage includes CAD/MI.   Review of systems  Constitutional: + Minimally fluctuating body weight,  current  Body mass index is 41.84 kg/m. , no fatigue, no subjective hyperthermia, no subjective hypothermia    Objective:    BP (!) 136/58   Pulse 88   Ht 5' 10 (1.778 m)   Wt 291  lb 9.6 oz (132.3 kg)   BMI 41.84 kg/m   Wt Readings from Last 3 Encounters:  08/25/24 291 lb 9.6 oz (132.3 kg)  08/11/24 290 lb (131.5 kg)  07/19/24 293 lb (132.9 kg)      Physical Exam- Limited  Constitutional:  Body mass index is 41.84 kg/m. , not in acute distress  CMP ( most recent) CMP     Component Value Date/Time   NA 140 08/04/2024 0740   NA 139 09/22/2022 0756   K 4.6 08/04/2024 0740   CL 102 08/04/2024 0740   CO2 28 08/04/2024 0740   GLUCOSE 156 (H) 08/04/2024 0740   BUN 14 08/04/2024 0740   BUN 16 09/22/2022 0756   CREATININE 0.99 08/04/2024 0740   CREATININE 0.91 04/30/2020 0728   CALCIUM  9.4 08/04/2024 0740   PROT 6.8 08/04/2024 0740   PROT 6.4 09/22/2022 0756   ALBUMIN 4.4 08/04/2024 0740   ALBUMIN 4.1 09/22/2022 0756   AST 25 08/04/2024 0740   ALT 31 08/04/2024 0740   ALKPHOS 105 08/04/2024 0740   BILITOT 0.6 08/04/2024 0740   BILITOT 0.6 09/22/2022 0756   GFRNONAA >60 08/04/2024 0740   GFRNONAA 102 04/30/2020 0728   GFRAA 118 04/30/2020 0728     Diabetic Labs (most recent): Lab Results   Component Value Date   HGBA1C 7.9 (A) 08/25/2024   HGBA1C 7.1 (A) 04/14/2024   HGBA1C 7.5 (H) 01/24/2024   MICROALBUR 1.0 04/30/2020   MICROALBUR 0.4 10/11/2019   MICROALBUR 0.8 04/19/2018    Lipid Panel     Component Value Date/Time   CHOL 111 09/22/2022 0756   TRIG 197 (H) 09/22/2022 0756   HDL 19 (L) 09/22/2022 0756   CHOLHDL 5.8 (H) 09/22/2022 0756   CHOLHDL 5.5 (H) 10/11/2019 0856   VLDL 47 (H) 03/13/2019 0547   LDLCALC 59 09/22/2022 0756   LDLCALC 63 10/11/2019 0856     Assessment & Plan:   1. Uncontrolled type 2 diabetes mellitus with chronic kidney disease, coronary artery disease, with long-term current use of insulin .  Davine presents with his CGM showing 50% time in range, 43% level 1 hyperglycemia, 7% level 2 hyperglycemia.  His average blood glucose 183 mg per DL, point-of-care J8r 2.0% increasing from 7.1% during his last visit.   He did not document any hypoglycemia.   Recent labs reviewed. -his diabetes is complicated by obesity/sedentary life, coronary artery disease which recently required coronary catheterization, chronic heavy smoking and MADDYX WIECK remains at exceedingly  high risk for more acute and chronic complications which include CAD, CVA, CKD, retinopathy, and neuropathy. These are all discussed in detail with the patient.  - I have counseled him on diet management and weight loss, by adopting a carbohydrate restricted/protein rich diet. -He is partially disengaged from lifestyle medicine.  He will still benefit from lifestyle medicine.  - he acknowledges that there is a room for improvement in his food and drink choices. - Suggestion is made for him to avoid simple carbohydrates  from his diet including Cakes, Sweet Desserts, Ice Cream, Soda (diet and regular), Sweet Tea, Candies, Chips, Cookies, Store Bought Juices, Alcohol  , Artificial Sweeteners,  Coffee Creamer, and Sugar-free Products, Lemonade. This will help patient to have more stable  blood glucose profile and potentially avoid unintended weight gain.   - he is advised to stick to a routine mealtimes to eat 3 meals  a day and avoid unnecessary snacks ( to snack only to correct hypoglycemia).   -  I have approached him with the following individualized plan to manage diabetes and patient agrees:   -Based on his presentation, he will continue to need intensive treatment with basal/bolus insulin  in order for him to maintain control of diabetes to target.  -Accordingly, he is advised to continue Lantus  80 units nightly, continue NovoLog  at 20-26 units 3 times daily before meals when Premeal blood glucose readings are above 90 mg per DL. - He would have benefited from SGLT2 inhibitors and he was approached  for Jardiance , however patient declined.  -He is advised to use his CGM at all times, especially monitoring before meals and at bedtime.   -He is warned not to use insulin  without monitoring of blood glucose. -He is open to consider higher dose of Ozempic .  I discussed and increased his Ozempic  to 2 mg subcutaneously weekly.  Side effects and precaution discussed with him.  -Patient is encouraged to call clinic for blood glucose levels less than 70 or above 200 mg /dl. He does not want to take metformin , because he believes metformin  is a bad medication. - Patient specific target  A1c;  LDL, HDL, Triglycerides,  were discussed in detail.  2) BP/HTN: - His blood pressure is controlled to target.   He is advised to continue  with his current blood pressure medications including lisinopril  40 mg p.o. daily along with his carvedilol  3.125 mg p.o. twice daily.     3) Lipids: His recent lipid panel showed improved and controlled LDL at 59.  He is advised to continue Lipitor  80 mg p.o. nightly.    He is also on omega-3 fatty acids 1000 mg p.o. twice daily.  Whole food plant-based diet will help with dyslipidemia.      4)  Weight/Diet: His BMI is 41.84 kg/m-clearly complicating  his diabetes care.  He is a candidate for major weight loss.  CDE Consult has been initiated  , exercise, and detailed carbohydrates information provided.  He is an ideal candidate for bariatric surgery.    5) Chronic Care/Health Maintenance:  -he  is on ACEI/ARB and Statin medications and  is encouraged to continue to follow up with Ophthalmology, Dentist,  Podiatrist at least yearly or according to recommendations, and advised to  quit smoking. I have recommended yearly flu vaccine and pneumonia vaccination at least every 5 years; moderate intensity exercise for up to 150 minutes weekly; and  sleep for at least 7 hours a day. The patient was counseled on the dangers of tobacco use, and was advised to quit.  Reviewed strategies to maximize success, including removing cigarettes and smoking materials from environment.  - I advised patient to maintain close follow up with Dow Longs, PA-C for primary care needs.  I spent  42  minutes in the care of the patient today including review of labs from CMP, Lipids, Thyroid  Function, Hematology (current and previous including abstractions from other facilities); face-to-face time discussing  his blood glucose readings/logs, discussing hypoglycemia and hyperglycemia episodes and symptoms, medications doses, his options of short and long term treatment based on the latest standards of care / guidelines;  discussion about incorporating lifestyle medicine;  and documenting the encounter. Risk reduction counseling performed per USPSTF guidelines to reduce  obesity and cardiovascular risk factors.     Please refer to Patient Instructions for Blood Glucose Monitoring and Insulin /Medications Dosing Guide  in media tab for additional information. Please  also refer to  Patient Self Inventory in the Media  tab for reviewed elements  of pertinent patient history.  Eva JAYSON Side participated in the discussions, expressed understanding, and voiced agreement with the  above plans.  All questions were answered to his satisfaction. he is encouraged to contact clinic should he have any questions or concerns prior to his return visit.    Follow up plan: - Return in about 4 months (around 12/23/2024) for Bring Meter/CGM Device/Logs- A1c in Office.  Ranny Earl, MD Phone: [phone]  Fax: [phone]  -  This note was partially dictated with voice recognition software. Similar sounding words can be transcribed inadequately or may not  be corrected upon review.  08/25/2024, 5:13 PM

## 2024-08-26 ENCOUNTER — Inpatient Hospital Stay

## 2024-09-11 ENCOUNTER — Other Ambulatory Visit: Payer: Self-pay | Admitting: "Endocrinology

## 2024-09-11 DIAGNOSIS — E1159 Type 2 diabetes mellitus with other circulatory complications: Secondary | ICD-10-CM

## 2024-09-19 ENCOUNTER — Telehealth: Payer: Self-pay

## 2024-09-19 ENCOUNTER — Other Ambulatory Visit (HOSPITAL_COMMUNITY): Payer: Self-pay

## 2024-09-19 ENCOUNTER — Telehealth: Payer: Self-pay | Admitting: "Endocrinology

## 2024-09-19 NOTE — Telephone Encounter (Signed)
 Pharmacy Patient Advocate Encounter   Received notification from Pt Calls Messages that prior authorization for Dexcom G7 sensor is required/requested.   Insurance verification completed.   The patient is insured through [facility].   Per test claim: PA required and submitted KEY/EOC/Request #: [id number] from 09/19/2024 to 09/19/2025

## 2024-09-19 NOTE — Telephone Encounter (Signed)
 Patient said the pharmacy has 3 PA request for his dexcom sensors and not getting a reply. Any update?

## 2024-09-30 ENCOUNTER — Other Ambulatory Visit: Payer: Self-pay | Admitting: Cardiology

## 2024-10-03 ENCOUNTER — Other Ambulatory Visit: Payer: Self-pay

## 2024-10-03 ENCOUNTER — Ambulatory Visit
Admission: EM | Admit: 2024-10-03 | Discharge: 2024-10-03 | Disposition: A | Discharge status: Home / Self Care | Attending: Nurse Practitioner | Admitting: Nurse Practitioner

## 2024-10-03 ENCOUNTER — Encounter: Payer: Self-pay | Admitting: *Deleted

## 2024-10-03 DIAGNOSIS — Z8679 Personal history of other diseases of the circulatory system: Secondary | ICD-10-CM

## 2024-10-03 DIAGNOSIS — I10 Essential (primary) hypertension: Secondary | ICD-10-CM

## 2024-10-03 NOTE — Discharge Instructions (Addendum)
 Your blood pressure reading was stable in our office today.  As discussed, recommend checking your blood pressures at random times during the day.  Make sure you are sitting with your feet on the floor with your arm to slightly raised the side of you to ensure the most accurate blood pressure readings. Continue your current medications. Recommend dietary changes and incorporating regular exercise into your normal routines to help control your blood pressure. Go to the emergency department if you experience continued elevated blood pressure readings with new symptoms of chest pain, shortness of breath, difficulty breathing, headache, dizziness, or visual changes. Follow-up with your primary care physician tomorrow as scheduled. Follow-up as needed.

## 2024-10-03 NOTE — ED Provider Notes (Signed)
 " [facility]    CSN: [id number] Arrival date & time: 10/03/24  1412      History   Chief Complaint Chief Complaint  Patient presents with   Chest Pain   Hypertension    HPI Lance [name] is a 48 y.o. male.   The history is provided by the patient.   Patient presents for complaints of elevated blood pressure.  Patient states blood pressure has been elevated over the past 3 days.  States his highest BP was 200/115.  States that he just purchased a new blood pressure cuff and use it in the upper portion of his arm.  States he was sitting in his truck when he took his blood pressure.  Also states he has been using a wrist cuff to check his blood pressure, and those blood pressures have been closer to his baseline which have been normal.  He denies chest pain, shortness of breath, difficulty breathing, headache, blurred vision, or lower extremity edema.  States that he does have some tingling in his lower extremities, but this is baseline for him and has been going on for quite some time.  He states he has noticed that as well over the past week.  Per review of the patient's chart, he does have underlying history of CAD, hypertension, MI and SVT along with type 2 diabetes.  He denies elevated heart rate while his blood pressure was elevated.  Patient states that he wanted to make sure he was okay.  States he is planning to see his PCP tomorrow.  Past Medical History:  Diagnosis Date   CAD (coronary artery disease), native coronary artery    10/18 PCI/DES to mRCA, and OM, with total occlusion of dLCx with collaterals.    Essential hypertension    GERD (gastroesophageal reflux disease)    History of kidney stones    Hyperlipemia    MI (myocardial infarction) (HCC) 2020   SVT (supraventricular tachycardia) 2015   Converted with adenosine    Type 2 diabetes mellitus (HCC)     Patient Active Problem List   Diagnosis Date Noted   Gastric polyp 05/03/2024   BMI 40.0-44.9,  adult (HCC) 02/02/2024   History of colonic polyps 02/02/2024   Iron  deficiency anemia 01/25/2024   Multiple gastric polyps 01/25/2024   Symptomatic anemia 01/24/2024   Upper GI bleed 01/24/2024   Gastritis and gastroduodenitis 08/06/2023   Adenomatous polyp of sigmoid colon 08/06/2023   Insulin  long-term use (HCC) 03/04/2023   Atypical chest pain 02/06/2023   Chronic HFimpEF 01/17/2023   Obesity (BMI 30-39.9) 01/16/2023   Chest pain at rest 01/16/2023   Unstable angina (HCC) 01/16/2023   Chest pain 01/15/2023   DM type 2 causing vascular disease (HCC) 10/23/2021   SVT (supraventricular tachycardia) 04/20/2019   Ischemic cardiomyopathy 04/20/2019   Atrial tachycardia 04/03/2019   Acute ST elevation myocardial infarction (STEMI) involving left main coronary artery (HCC)    VF (ventricular fibrillation) (HCC)    Loud snoring    STEMI (ST elevation myocardial infarction) (HCC) 03/12/2019   STEMI involving left anterior descending coronary artery (HCC) 03/12/2019   Incarcerated incisional hernia s/p redeuction/primary repair 11/12/2017 11/12/2017   Chronic anticoagulation (Plavix ) 11/12/2017   Chronic appendicitis s/p lap appendectomy 11/12/2017 09/05/2017   S/P drug eluting coronary stent placement    Uncontrolled type 2 diabetes mellitus with hyperglycemia (HCC) 08/25/2017   CAD (coronary artery disease) 07/15/2017   Morbid obesity (HCC) 05/20/2017   Family history of early CAD 12/29/2016  Type 2 diabetes mellitus, uncontrolled 08/31/2016   Mixed hyperlipidemia 08/31/2016   Current smoker 08/31/2016   Essential hypertension, benign 04/21/2013    Past Surgical History:  Procedure Laterality Date   BIOPSY  08/06/2023   Procedure: BIOPSY;  Surgeon: Cinderella Deatrice FALCON, MD;  [facility];  Service: Endoscopy;;   CARDIAC SURGERY     COLONOSCOPY WITH PROPOFOL  N/A 08/06/2023   Procedure: COLONOSCOPY WITH PROPOFOL ;  Surgeon: Cinderella Deatrice FALCON, MD;  [facility];   Service: Endoscopy;  Laterality: N/A;   CORONARY ANGIOPLASTY WITH STENT PLACEMENT  07/15/2017   2 stents   CORONARY BALLOON ANGIOPLASTY N/A 01/16/2023   Procedure: CORONARY BALLOON ANGIOPLASTY;  Surgeon: Burnard Debby LABOR, MD;  [facility];  Service: Cardiovascular;  Laterality: N/A;   CORONARY/GRAFT ACUTE MI REVASCULARIZATION N/A 03/12/2019   Procedure: Coronary/Graft Acute MI Revascularization;  Surgeon: Mady Bruckner, MD;  [facility]: [facility];  Service: Cardiovascular;  Laterality: N/A;   ESOPHAGOGASTRODUODENOSCOPY N/A 01/25/2024   Procedure: EGD (ESOPHAGOGASTRODUODENOSCOPY);  Surgeon: Cindie Carlin POUR, DO;  [facility];  Service: Endoscopy;  Laterality: N/A;   ESOPHAGOGASTRODUODENOSCOPY (EGD) WITH PROPOFOL  N/A 08/06/2023   Procedure: ESOPHAGOGASTRODUODENOSCOPY (EGD) WITH PROPOFOL ;  Surgeon: Cinderella Deatrice FALCON, MD;  [facility];  Service: Endoscopy;  Laterality: N/A;  1:30pm;asa 3   GIVENS CAPSULE STUDY N/A 03/07/2024   Procedure: IMAGING PROCEDURE, GI TRACT, INTRALUMINAL, VIA CAPSULE;  Surgeon: Cinderella Deatrice FALCON, MD;  [facility];  Service: Endoscopy;  Laterality: N/A;  7:30AM;GIVENS   HEMOSTASIS CLIP PLACEMENT  08/06/2023   Procedure: HEMOSTASIS CLIP PLACEMENT;  Surgeon: Cinderella Deatrice FALCON, MD;  [facility];  Service: Endoscopy;;   LAPAROSCOPIC APPENDECTOMY N/A 11/12/2017   Procedure: APPENDECTOMY LAPAROSCOPIC, REPAIR OF INCARCERATED INCISIONAL HERNIA;  Surgeon: Sheldon Standing, MD;  Location: [facility];  Service: General;  Laterality: N/A;   LAPAROSCOPIC CHOLECYSTECTOMY     LEFT HEART CATH AND CORONARY ANGIOGRAPHY N/A 07/15/2017   Procedure: LEFT HEART CATH AND CORONARY ANGIOGRAPHY;  Surgeon: Wonda Sharper, MD;  [facility]: [facility];  Service: Cardiovascular;  Laterality: N/A;   LEFT HEART CATH AND CORONARY ANGIOGRAPHY N/A 03/12/2019   Procedure: LEFT HEART CATH AND CORONARY ANGIOGRAPHY;  Surgeon: Mady Bruckner, MD;   [facility];  Service: Cardiovascular;  Laterality: N/A;   LEFT HEART CATH AND CORONARY ANGIOGRAPHY N/A 01/16/2023   Procedure: LEFT HEART CATH AND CORONARY ANGIOGRAPHY;  Surgeon: Burnard Debby LABOR, MD;  [facility];  Service: Cardiovascular;  Laterality: N/A;   POLYPECTOMY  08/06/2023   Procedure: POLYPECTOMY INTESTINAL;  Surgeon: Cinderella Deatrice FALCON, MD;  [facility];  Service: Endoscopy;;   SVT ABLATION N/A 04/22/2021   Procedure: SVT ABLATION;  Surgeon: Waddell Danelle ORN, MD;  [facility];  Service: Cardiovascular;  Laterality: N/A;       Home Medications    Prior to Admission medications  Medication Sig Start Date End Date Taking? Authorizing Provider  aspirin  EC 81 MG tablet Take 1 tablet (81 mg total) by mouth daily with breakfast. 04/05/19  Yes Emokpae, Courage, MD  atorvastatin  (LIPITOR ) 80 MG tablet TAKE 1 TABLET BY MOUTH EVERYDAY AT BEDTIME 09/30/24  Yes Debera Jayson MATSU, MD  B-D ULTRAFINE III SHORT PEN 31G X 8 MM MISC USE TO INJECT INSULIN  4 (FOUR) TIMES DAILY. 10/01/23  Yes Nida, Gebreselassie W, MD  Continuous Glucose Sensor (DEXCOM G7 SENSOR) MISC CHANGE SENSOR EVERY 10  DAYS 09/12/24  Yes Nida, Gebreselassie W, MD  famotidine  (PEPCID ) 40 MG tablet Take 40 mg by mouth daily. 04/30/23  Yes [provider]  ferrous sulfate  325 (65 FE) MG EC tablet TAKE 1 TABLET BY MOUTH TWICE A DAY 08/08/24  Yes Ahmed, Muhammad F, MD  insulin  aspart (NOVOLOG  FLEXPEN) 100 UNIT/ML FlexPen INJECT 20-26 UNITS INTO THE SKIN 3 (THREE) TIMES DAILY WITH MEALS. 08/17/24  Yes Nida, Gebreselassie W, MD  insulin  glargine (LANTUS  SOLOSTAR) 100 UNIT/ML Solostar Pen INJECT 80 UNITS INTO THE SKIN AT BEDTIME. 07/21/24  Yes Nida, Gebreselassie W, MD  lisinopril  (ZESTRIL ) 40 MG tablet TAKE 1 TABLET BY MOUTH EVERY DAY 07/05/24  Yes Debera Jayson MATSU, MD  pantoprazole  (PROTONIX ) 40 MG tablet Take 40 mg by mouth at bedtime. 01/04/24  Yes [provider]  Semaglutide , 2 MG/DOSE, 8 MG/3ML SOPN Inject 2 mg as directed once a week. 08/25/24  Yes Nida, Gebreselassie W, MD  ticagrelor  (BRILINTA ) 60 MG TABS tablet Take 1 tablet (60 mg total) by mouth 2 (two) times daily. 07/19/24  Yes Debera Jayson MATSU, MD  blood glucose meter kit and supplies KIT 1 each by Does not apply route 4 (four) times daily. Dispense based on patient and insurance preference. Use up to four times daily as directed. (FOR ICD-9 250.00, 250.01). 02/14/20   Lenis Ethelle ORN, MD  docusate sodium  (COLACE) 100 MG capsule Take 1 capsule (100 mg total) by mouth 2 (two) times daily. 01/25/24 01/24/25  Maree, Pratik D, DO  LANTUS  SOLOSTAR 100 UNIT/ML Solostar Pen Inject 80 Units into the skin at bedtime. 04/17/24   [provider]  nitroGLYCERIN  (NITROSTAT ) 0.4 MG SL tablet Place 1 tablet (0.4 mg total) under the tongue every 5 (five) minutes x 3 doses as needed for chest pain. 09/02/21   Debera Jayson MATSU, MD  Sodium Sulfate-Mag Sulfate-KCl (SUTAB ) [phone] MG TABS Take 12 tablets by mouth as directed. 05/13/24   Cinderella Deatrice FALCON, MD    Family History Family History  Problem Relation Age of Onset   Heart disease Mother 48       CABG   Diabetes Mother    Gout Mother    Heart attack Father 56    Social History Social History[1]   Allergies   Bee venom and Penicillins   Review of Systems Review of Systems Per HPI  Physical Exam Triage Vital Signs ED Triage Vitals  Encounter Vitals Group     BP 10/03/24 1442 131/71     Girls Systolic BP Percentile --      Girls Diastolic BP Percentile --      Boys Systolic BP Percentile --      Boys Diastolic BP Percentile --      Pulse Rate 10/03/24 1442 70     Resp 10/03/24 1442 18     Temp 10/03/24 1442 98.1 F (36.7 C)     Temp Source 10/03/24 1442 Oral     SpO2 10/03/24 1442 97 %     Weight --      Height --      Head Circumference --      Peak Flow --      Pain Score 10/03/24 1443 0     Pain  Loc --      Pain Education --      Exclude from Growth Chart --    No data found.  Updated Vital Signs BP 134/83 (BP Location: Right Arm)   Pulse 61  Temp 98.1 F (36.7 C) (Oral)   Resp 18   SpO2 95%   Visual Acuity Right Eye Distance:   Left Eye Distance:   Bilateral Distance:    Right Eye Near:   Left Eye Near:    Bilateral Near:     Physical Exam   UC Treatments / Results  Labs (all labs ordered are listed, but only abnormal results are displayed) Labs Reviewed - No data to display  EKG: Normal sinus rhythm, no STEMI.  Compared to EKGs dated 07/18/2024, 8//25, and 01/24/2024.   Radiology No results found.  Procedures Procedures (including critical care time)  Medications Ordered in UC Medications - No data to display  Initial Impression / Assessment and Plan / UC Course  I have reviewed the triage vital signs and the nursing notes.  Pertinent labs & imaging results that were available during my care of the patient were reviewed by me and considered in my medical decision making (see chart for details).  On exam, the patient is well-appearing, he is in no acute distress, his vital signs are stable.  The initial BP during triage was 131/71.  Repeat BP at discharge was 134/83.  His vital signs are otherwise stable.  He currently denies chest pain.  EKG was performed, which shows normal sinus rhythm, no STEMI.  Compared to previous EKGs in his chart.  Supportive care recommendations were provided and discussed with the patient to include avoiding stress, education on the proper ways to check his blood pressure, and to follow-up with his PCP as scheduled.  Patient was also given strict ER follow-up precautions.  Patient was in agreement with this plan of care and verbalizes understanding.  All questions were answered.  Patient stable for discharge.   Final Clinical Impressions(s) / UC Diagnoses   Final diagnoses:  Elevated blood pressure reading with diagnosis of  hypertension     Discharge Instructions      Your blood pressure reading was stable in our office today.  As discussed, recommend checking your blood pressures at random times during the day.  Make sure you are sitting with your feet on the floor with your arm to slightly raised the side of you to ensure the most accurate blood pressure readings. Continue your current medications. Recommend dietary changes and incorporating regular exercise into your normal routines to help control your blood pressure. Go to the emergency department if you experience continued elevated blood pressure readings with new symptoms of chest pain, shortness of breath, difficulty breathing, headache, dizziness, or visual changes. Follow-up with your primary care physician tomorrow as scheduled. Follow-up as needed.     ED Prescriptions   None    PDMP not reviewed this encounter.    [1]  Social History Tobacco Use   Smoking status: Former    Current packs/day: 0.00    Average packs/day: 0.3 packs/day for 26.0 years (6.5 ttl pk-yrs)    Types: Cigarettes    Start date: 03/06/1993    Quit date: 03/07/2019    Years since quitting: 5.5    Passive exposure: Never   Smokeless tobacco: Never  Vaping Use   Vaping status: Never Used  Substance Use Topics   Alcohol  use: No   Drug use: No     Gilmer Etta PARAS, NP 10/03/24 1529  "

## 2024-10-03 NOTE — ED Triage Notes (Signed)
 Pt states he noticed his blood pressure was elevated for the past 3 days, highest was 200/115 but goes up and down throughout the day. Pt states he is also having chest discomfort/pain x 3 days. Tingling on head and fingers x 1 week.

## 2024-10-07 ENCOUNTER — Inpatient Hospital Stay: Attending: Hematology

## 2024-10-07 VITALS — BP 152/77 | HR 79 | Temp 97.8°F | Resp 18

## 2024-10-07 DIAGNOSIS — D509 Iron deficiency anemia, unspecified: Secondary | ICD-10-CM

## 2024-10-07 MED ORDER — METHYLPREDNISOLONE SODIUM SUCC 125 MG IJ SOLR
125.0000 mg | Freq: Once | INTRAMUSCULAR | Status: AC
  Administered 2024-10-07: 125 mg via INTRAVENOUS
  Filled 2024-10-07: qty 2

## 2024-10-07 MED ORDER — ACETAMINOPHEN 325 MG PO TABS
650.0000 mg | ORAL_TABLET | Freq: Once | ORAL | Status: AC
  Administered 2024-10-07: 650 mg via ORAL
  Filled 2024-10-07: qty 2

## 2024-10-07 MED ORDER — CETIRIZINE HCL 10 MG PO TABS
10.0000 mg | ORAL_TABLET | Freq: Once | ORAL | Status: AC
  Administered 2024-10-07: 10 mg via ORAL
  Filled 2024-10-07: qty 1

## 2024-10-07 MED ORDER — IRON SUCROSE 300 MG IVPB - SIMPLE MED
300.0000 mg | Freq: Once | Status: AC
  Administered 2024-10-07: 300 mg via INTRAVENOUS
  Filled 2024-10-07: qty 200

## 2024-10-07 MED ORDER — SODIUM CHLORIDE 0.9 % IV SOLN: INTRAVENOUS | Status: DC

## 2024-10-07 NOTE — Patient Instructions (Signed)
 [facility] [place] - A DEPT OF [place].  HOSPITAL  Discharge Instructions: Thank you for choosing [facility] to provide your oncology and hematology care.  If you have a lab appointment with the Cancer Center - please note that after April 8th, 2024, all labs will be drawn in the cancer center.  You do not have to check in or register with the main entrance as you have in the past but will complete your check-in in the cancer center.  Wear comfortable clothing and clothing appropriate for easy access to any Portacath or PICC line.   We strive to give you quality time with your provider. You may need to reschedule your appointment if you arrive late (15 or more minutes).  Arriving late affects you and other patients whose appointments are after yours.  Also, if you miss three or more appointments without notifying the office, you may be dismissed from the clinic at the provider's discretion.      For prescription refill requests, have your pharmacy contact our office and allow 72 hours for refills to be completed.    Today you received the following Venofer  iron  infusion.    Iron  Sucrose Injection What is this medication? IRON  SUCROSE (EYE ern SOO krose) treats low levels of iron  (iron  deficiency anemia) in people with kidney disease. Iron  is a mineral that plays an important role in making red blood cells, which carry oxygen  from your lungs to the rest of your body. This medicine may be used for other purposes; ask your health care provider or pharmacist if you have questions. COMMON BRAND NAME(S): Venofer  What should I tell my care team before I take this medication? They need to know if you have any of these conditions: Anemia not caused by low iron  levels Heart disease High levels of iron  in the blood Kidney disease Liver disease An unusual or allergic reaction to iron , other medications, foods, dyes, or preservatives Pregnant or trying to get  pregnant Breastfeeding How should I use this medication? This medication is infused into a vein. It is given by your care team in a hospital or clinic setting. Talk to your care team about the use of this medication in children. While it may be prescribed for children as young as 2 years for selected conditions, precautions do apply. Overdosage: If you think you have taken too much of this medicine contact a poison control center or emergency room at once. NOTE: This medicine is only for you. Do not share this medicine with others. What if I miss a dose? Keep appointments for follow-up doses. It is important not to miss your dose. Call your care team if you are unable to keep an appointment. What may interact with this medication? Do not take this medication with any of the following: Deferoxamine Dimercaprol Other iron  products This medication may also interact with the following: Chloramphenicol Deferasirox This list may not describe all possible interactions. Give your health care provider a list of all the medicines, herbs, non-prescription drugs, or dietary supplements you use. Also tell them if you smoke, drink alcohol, or use illegal drugs. Some items may interact with your medicine. What should I watch for while using this medication? Visit your care team for regular checks on your progress. Tell your care team if your symptoms do not start to get better or if they get worse. You may need blood work done while you are taking this medication. You may need to  eat more foods that contain iron . Talk to your care team. Foods that contain iron  include whole grains or cereals, dried fruits, beans, peas, leafy green vegetables, and organ meats (liver, kidney). What side effects may I notice from receiving this medication? Side effects that you should report to your care team as soon as possible: Allergic reactions--skin rash, itching, hives, swelling of the face, lips, tongue, or throat Low  blood pressure--dizziness, feeling faint or lightheaded, blurry vision Shortness of breath Side effects that usually do not require medical attention (report to your care team if they continue or are bothersome): Flushing Headache Joint pain Muscle pain Nausea Pain, redness, or irritation at injection site This list may not describe all possible side effects. Call your doctor for medical advice about side effects. You may report side effects to FDA at 1-800-FDA-1088. Where should I keep my medication? This medication is given in a hospital or clinic. It will not be stored at home. NOTE: This sheet is a summary. It may not cover all possible information. If you have questions about this medicine, talk to your doctor, pharmacist, or health care provider.  2024 Elsevier/Gold Standard (2023-05-13 00:00:00)   To help prevent nausea and vomiting after your treatment, we encourage you to take your nausea medication as directed.  BELOW ARE SYMPTOMS THAT SHOULD BE REPORTED IMMEDIATELY: *FEVER GREATER THAN 100.4 F (38 C) OR HIGHER *CHILLS OR SWEATING *NAUSEA AND VOMITING THAT IS NOT CONTROLLED WITH YOUR NAUSEA MEDICATION *UNUSUAL SHORTNESS OF BREATH *UNUSUAL BRUISING OR BLEEDING *URINARY PROBLEMS (pain or burning when urinating, or frequent urination) *BOWEL PROBLEMS (unusual diarrhea, constipation, pain near the anus) TENDERNESS IN MOUTH AND THROAT WITH OR WITHOUT PRESENCE OF ULCERS (sore throat, sores in mouth, or a toothache) UNUSUAL RASH, SWELLING OR PAIN  UNUSUAL VAGINAL DISCHARGE OR ITCHING   Items with * indicate a potential emergency and should be followed up as soon as possible or go to the Emergency Department if any problems should occur.  Please show the CHEMOTHERAPY ALERT CARD or IMMUNOTHERAPY ALERT CARD at check-in to the Emergency Department and triage nurse.  Should you have questions after your visit or need to cancel or reschedule your appointment, please contact [facility] [place] - A DEPT OF [facility] [phone]  and follow the prompts.  Office hours are 8:00 a.m. to 4:30 p.m. Monday - Friday. Please note that voicemails left after 4:00 p.m. may not be returned until the following business day.  We are closed weekends and major holidays. You have access to a nurse at all times for urgent questions. Please call the main number to the clinic [phone] and follow the prompts.  For any non-urgent questions, you may also contact your provider using MyChart. We now offer e-Visits for anyone 25 and older to request care online for non-urgent symptoms. For details visit [email or web address].   Also download the MyChart app! Go to the app store, search MyChart, open the app, select [place], and log in with your MyChart username and password.

## 2024-10-07 NOTE — Progress Notes (Signed)
 Patient presents today for iron  infusion of Venofer . Vital signs are stable.  Patient does report numbness in legs bilaterally x1 month. Patient is diabetic and reports having an appointment with PCP. Delon Hope NP made aware. Per Delon Hope okay to Treat today. We will proceed with infusion per provider orders.    Patient tolerated treatment well with no complaints voiced.  Patient left ambulatory in stable condition.  Vital signs stable at discharge.  Follow up as scheduled.

## 2024-10-17 ENCOUNTER — Other Ambulatory Visit: Payer: Self-pay | Admitting: "Endocrinology

## 2024-10-17 DIAGNOSIS — E1165 Type 2 diabetes mellitus with hyperglycemia: Secondary | ICD-10-CM

## 2024-10-20 ENCOUNTER — Encounter (INDEPENDENT_AMBULATORY_CARE_PROVIDER_SITE_OTHER): Payer: Self-pay | Admitting: Gastroenterology

## 2024-10-23 ENCOUNTER — Other Ambulatory Visit: Payer: Self-pay

## 2024-10-23 ENCOUNTER — Ambulatory Visit
Admission: EM | Admit: 2024-10-23 | Discharge: 2024-10-23 | Disposition: A | Discharge status: Home / Self Care | Attending: Nurse Practitioner | Admitting: Nurse Practitioner

## 2024-10-23 DIAGNOSIS — R059 Cough, unspecified: Secondary | ICD-10-CM

## 2024-10-23 DIAGNOSIS — I10 Essential (primary) hypertension: Secondary | ICD-10-CM

## 2024-10-23 DIAGNOSIS — Z8639 Personal history of other endocrine, nutritional and metabolic disease: Secondary | ICD-10-CM | POA: Diagnosis not present

## 2024-10-23 NOTE — Discharge Instructions (Addendum)
 Continue to monitor your blood pressure at home.  Go to the emergency department if you experience symptoms such as headache, dizziness, chest pain, shortness of breath, or difficulty breathing. Can continue to monitor your blood glucose as discussed.  Please follow-up with your endocrinologist or primary care physician to discuss medications such as Zepbound or Mounjaro to help with weight loss. For your upper respiratory symptoms, you may take over-the-counter cough medication such as Coricidin HBP to help with symptoms. Consider smoking cessation to help lower your blood pressure. Follow-up with cardiology as scheduled. Follow-up as needed.

## 2024-10-23 NOTE — ED Triage Notes (Signed)
 Pt he has a headache, cough, some chest congestion, elevated BP reading of 167/82 x 2 days

## 2024-10-23 NOTE — ED Provider Notes (Signed)
 " [facility]    CSN: [id number] Arrival date & time: 10/23/24  1102      History   Chief Complaint No chief complaint on file.   HPI Lance [name] is a 49 y.o. male.   The history is provided by the patient.   Patient presents with a 2-day history of elevated blood pressure and just not feeling right.  Patient also endorses upper respiratory symptoms to include headache, cough, and chest congestion.  States BP has not been any higher than 167/82.  Patient also is diabetic his current blood glucose is 149 per his Dexcom reading.  States blood glucoses have not exceeded 180.  Patient further denies chest pain, shortness of breath, difficulty breathing, abdominal pain, nausea, vomiting, or lower extremity edema.  Patient states that he is planning to see a cardiologist tomorrow.  Patient with history of hypertension, diabetes, hyperlipidemia, SVT, and MI.  He is currently smoking at this time.  Past Medical History:  Diagnosis Date   CAD (coronary artery disease), native coronary artery    10/18 PCI/DES to mRCA, and OM, with total occlusion of dLCx with collaterals.    Essential hypertension    GERD (gastroesophageal reflux disease)    History of kidney stones    Hyperlipemia    MI (myocardial infarction) (HCC) 2020   SVT (supraventricular tachycardia) 2015   Converted with adenosine    Type 2 diabetes mellitus [facility])     Patient Active Problem List   Diagnosis Date Noted   Gastric polyp 05/03/2024   BMI 40.0-44.9, adult (HCC) 02/02/2024   History of colonic polyps 02/02/2024   Iron  deficiency anemia 01/25/2024   Multiple gastric polyps 01/25/2024   Symptomatic anemia 01/24/2024   Upper GI bleed 01/24/2024   Gastritis and gastroduodenitis 08/06/2023   Adenomatous polyp of sigmoid colon 08/06/2023   Insulin  long-term use (HCC) 03/04/2023   Atypical chest pain 02/06/2023   Chronic HFimpEF 01/17/2023   Obesity (BMI 30-39.9) 01/16/2023   Chest pain at rest  01/16/2023   Unstable angina (HCC) 01/16/2023   Chest pain 01/15/2023   DM type 2 causing vascular disease (HCC) 10/23/2021   SVT (supraventricular tachycardia) 04/20/2019   Ischemic cardiomyopathy 04/20/2019   Atrial tachycardia 04/03/2019   Acute ST elevation myocardial infarction (STEMI) involving left main coronary artery (HCC)    VF (ventricular fibrillation) (HCC)    Loud snoring    STEMI (ST elevation myocardial infarction) (HCC) 03/12/2019   STEMI involving left anterior descending coronary artery (HCC) 03/12/2019   Incarcerated incisional hernia s/p redeuction/primary repair 11/12/2017 11/12/2017   Chronic anticoagulation (Plavix ) 11/12/2017   Chronic appendicitis s/p lap appendectomy 11/12/2017 09/05/2017   S/P drug eluting coronary stent placement    Uncontrolled type 2 diabetes mellitus with hyperglycemia (HCC) 08/25/2017   CAD (coronary artery disease) 07/15/2017   Morbid obesity (HCC) 05/20/2017   Family history of early CAD 12/29/2016   Type 2 diabetes mellitus, uncontrolled 08/31/2016   Mixed hyperlipidemia 08/31/2016   Current smoker 08/31/2016   Essential hypertension, benign 04/21/2013    Past Surgical History:  Procedure Laterality Date   BIOPSY  08/06/2023   Procedure: BIOPSY;  Surgeon: Cinderella Deatrice FALCON, MD;  [facility]: [facility];  Service: Endoscopy;;   CARDIAC SURGERY     COLONOSCOPY WITH PROPOFOL  N/A 08/06/2023   Procedure: COLONOSCOPY WITH PROPOFOL ;  Surgeon: Cinderella Deatrice FALCON, MD;  Location: [facility];  Service: Endoscopy;  Laterality: N/A;   CORONARY ANGIOPLASTY WITH STENT PLACEMENT  07/15/2017  2 stents   CORONARY BALLOON ANGIOPLASTY N/A 01/16/2023   Procedure: CORONARY BALLOON ANGIOPLASTY;  Surgeon: Burnard Debby LABOR, MD;  Location: [facility];  Service: Cardiovascular;  Laterality: N/A;   CORONARY/GRAFT ACUTE MI REVASCULARIZATION N/A 03/12/2019   Procedure: Coronary/Graft Acute MI Revascularization;  Surgeon: Mady Bruckner, MD;   Location: [facility];  Service: Cardiovascular;  Laterality: N/A;   ESOPHAGOGASTRODUODENOSCOPY N/A 01/25/2024   Procedure: EGD (ESOPHAGOGASTRODUODENOSCOPY);  Surgeon: Cindie Carlin POUR, DO;  [facility]: [facility];  Service: Endoscopy;  Laterality: N/A;   ESOPHAGOGASTRODUODENOSCOPY (EGD) WITH PROPOFOL  N/A 08/06/2023   Procedure: ESOPHAGOGASTRODUODENOSCOPY (EGD) WITH PROPOFOL ;  Surgeon: Cinderella Deatrice FALCON, MD;  [facility]: [facility];  Service: Endoscopy;  Laterality: N/A;  1:30pm;asa 3   GIVENS CAPSULE STUDY N/A 03/07/2024   Procedure: IMAGING PROCEDURE, GI TRACT, INTRALUMINAL, VIA CAPSULE;  Surgeon: Cinderella Deatrice FALCON, MD;  [facility]: [facility];  Service: Endoscopy;  Laterality: N/A;  7:30AM;GIVENS   HEMOSTASIS CLIP PLACEMENT  08/06/2023   Procedure: HEMOSTASIS CLIP PLACEMENT;  Surgeon: Cinderella Deatrice FALCON, MD;  [facility];  Service: Endoscopy;;   LAPAROSCOPIC APPENDECTOMY N/A 11/12/2017   Procedure: APPENDECTOMY LAPAROSCOPIC, REPAIR OF INCARCERATED INCISIONAL HERNIA;  Surgeon: Sheldon Standing, MD;  Location: [facility];  Service: General;  Laterality: N/A;   LAPAROSCOPIC CHOLECYSTECTOMY     LEFT HEART CATH AND CORONARY ANGIOGRAPHY N/A 07/15/2017   Procedure: LEFT HEART CATH AND CORONARY ANGIOGRAPHY;  Surgeon: Wonda Sharper, MD;  Location: [facility];  Service: Cardiovascular;  Laterality: N/A;   LEFT HEART CATH AND CORONARY ANGIOGRAPHY N/A 03/12/2019   Procedure: LEFT HEART CATH AND CORONARY ANGIOGRAPHY;  Surgeon: Mady Bruckner, MD;  [facility];  Service: Cardiovascular;  Laterality: N/A;   LEFT HEART CATH AND CORONARY ANGIOGRAPHY N/A 01/16/2023   Procedure: LEFT HEART CATH AND CORONARY ANGIOGRAPHY;  Surgeon: Burnard Debby LABOR, MD;  Location: [facility];  Service: Cardiovascular;  Laterality: N/A;   POLYPECTOMY  08/06/2023   Procedure: POLYPECTOMY INTESTINAL;  Surgeon: Cinderella Deatrice FALCON, MD;  [facility];  Service: Endoscopy;;   SVT  ABLATION N/A 04/22/2021   Procedure: SVT ABLATION;  Surgeon: Waddell Danelle ORN, MD;  Location: [facility];  Service: Cardiovascular;  Laterality: N/A;       Home Medications    Prior to Admission medications  Medication Sig Start Date End Date Taking? Authorizing Provider  aspirin  EC 81 MG tablet Take 1 tablet (81 mg total) by mouth daily with breakfast. 04/05/19   Pearlean, Courage, MD  atorvastatin  (LIPITOR ) 80 MG tablet TAKE 1 TABLET BY MOUTH EVERYDAY AT BEDTIME 09/30/24   Debera Jayson MATSU, MD  B-D ULTRAFINE III SHORT PEN 31G X 8 MM MISC USE TO INJECT INSULIN  4 (FOUR) TIMES DAILY. 10/01/23   Nida, Gebreselassie W, MD  blood glucose meter kit and supplies KIT 1 each by Does not apply route 4 (four) times daily. Dispense based on patient and insurance preference. Use up to four times daily as directed. (FOR ICD-9 250.00, 250.01). 02/14/20   Lenis Ethelle ORN, MD  Continuous Glucose Sensor (DEXCOM G7 SENSOR) MISC CHANGE SENSOR EVERY 10 DAYS 09/12/24   Nida, Gebreselassie W, MD  docusate sodium  (COLACE) 100 MG capsule Take 1 capsule (100 mg total) by mouth 2 (two) times daily. 01/25/24 01/24/25  Maree, Pratik D, DO  famotidine  (PEPCID ) 40 MG tablet Take 40 mg by mouth daily. 04/30/23   [provider]  ferrous sulfate  325 (65  FE) MG EC tablet TAKE 1 TABLET BY MOUTH TWICE A DAY 08/08/24   Ahmed, Deatrice FALCON, MD  insulin  aspart (NOVOLOG  FLEXPEN) 100 UNIT/ML FlexPen INJECT 20-26 UNITS INTO THE SKIN 3 (THREE) TIMES DAILY WITH MEALS. 08/17/24   Nida, Gebreselassie W, MD  insulin  glargine (LANTUS  SOLOSTAR) 100 UNIT/ML Solostar Pen INJECT 80 UNITS INTO THE SKIN AT BEDTIME. 10/17/24   Nida, Gebreselassie W, MD  LANTUS  SOLOSTAR 100 UNIT/ML Solostar Pen Inject 80 Units into the skin at bedtime. 04/17/24   [provider]  lisinopril  (ZESTRIL ) 40 MG tablet TAKE 1 TABLET BY MOUTH EVERY DAY 07/05/24   Debera Jayson MATSU, MD  nitroGLYCERIN  (NITROSTAT ) 0.4 MG SL tablet Place 1 tablet (0.4 mg  total) under the tongue every 5 (five) minutes x 3 doses as needed for chest pain. 09/02/21   Debera Jayson MATSU, MD  pantoprazole  (PROTONIX ) 40 MG tablet Take 40 mg by mouth at bedtime. 01/04/24   [provider]  Semaglutide , 2 MG/DOSE, 8 MG/3ML SOPN Inject 2 mg as directed once a week. 08/25/24   Nida, Gebreselassie W, MD  Sodium Sulfate-Mag Sulfate-KCl (SUTAB ) [phone] MG TABS Take 12 tablets by mouth as directed. 05/13/24   Ahmed, Deatrice FALCON, MD  ticagrelor  (BRILINTA ) 60 MG TABS tablet Take 1 tablet (60 mg total) by mouth 2 (two) times daily. 07/19/24   Debera Jayson MATSU, MD    Family History Family History  Problem Relation Age of Onset   Heart disease Mother 39       CABG   Diabetes Mother    Gout Mother    Heart attack Father 78    Social History Social History[1]   Allergies   Bee venom and Penicillins   Review of Systems Review of Systems Per HPI  Physical Exam Triage Vital Signs ED Triage Vitals  Encounter Vitals Group     BP 10/23/24 1108 (!) 157/83     Girls Systolic BP Percentile --      Girls Diastolic BP Percentile --      Boys Systolic BP Percentile --      Boys Diastolic BP Percentile --      Pulse Rate 10/23/24 1108 78     Resp 10/23/24 1108 20     Temp 10/23/24 1108 97.6 F (36.4 C)     Temp Source 10/23/24 1108 Oral     SpO2 10/23/24 1108 98 %     Weight --      Height --      Head Circumference --      Peak Flow --      Pain Score 10/23/24 1110 0     Pain Loc --      Pain Education --      Exclude from Growth Chart --    Orthostatic VS for the past 24 hrs:  BP- Lying Pulse- Lying BP- Sitting Pulse- Sitting BP- Standing at 0 minutes Pulse- Standing at 0 minutes  10/23/24 1138 147/86 69 125/79 73 132/79 76    Updated Vital Signs BP (!) 157/83 (BP Location: Right Arm)   Pulse 78   Temp 97.6 F (36.4 C) (Oral)   Resp 20   SpO2 98%   Visual Acuity Right Eye Distance:   Left Eye Distance:   Bilateral Distance:    Right  Eye Near:   Left Eye Near:    Bilateral Near:     Physical Exam Vitals and nursing note reviewed.  Constitutional:      General: He  is not in acute distress.    Appearance: Normal appearance.  HENT:     Head: Normocephalic.     Right Ear: Tympanic membrane, ear canal and external ear normal.     Left Ear: Tympanic membrane, ear canal and external ear normal.     Nose: Congestion present.     Mouth/Throat:     Lips: Pink.     Mouth: Mucous membranes are moist.     Pharynx: Oropharynx is clear. Uvula midline. Postnasal drip present. No pharyngeal swelling, oropharyngeal exudate, posterior oropharyngeal erythema or uvula swelling.     Comments: Cobblestoning present to posterior oropharynx  Eyes:     Extraocular Movements: Extraocular movements intact.     Pupils: Pupils are equal, round, and reactive to light.  Cardiovascular:     Rate and Rhythm: Normal rate and regular rhythm.     Pulses: Normal pulses.     Heart sounds: Normal heart sounds.  Pulmonary:     Effort: Pulmonary effort is normal. No respiratory distress.     Breath sounds: Normal breath sounds. No stridor. No wheezing, rhonchi or rales.  Abdominal:     General: Bowel sounds are normal.     Palpations: Abdomen is soft.     Tenderness: There is no abdominal tenderness.  Musculoskeletal:     Cervical back: Normal range of motion.  Skin:    General: Skin is warm and dry.  Neurological:     General: No focal deficit present.     Mental Status: He is alert and oriented to person, place, and time.  Psychiatric:        Mood and Affect: Mood normal.        Behavior: Behavior normal.      UC Treatments / Results  Labs (all labs ordered are listed, but only abnormal results are displayed) Labs Reviewed - No data to display   EKG: Normal sinus rhythm, incomplete bundle branch block, no STEMI.  Compared to EKGs dated 10/03/2024, 07/18/2024 and 8//25.   Radiology No results found.  Procedures Procedures  (including critical care time)  Medications Ordered in UC Medications - No data to display  Initial Impression / Assessment and Plan / UC Course  I have reviewed the triage vital signs and the nursing notes.  Pertinent labs & imaging results that were available during my care of the patient were reviewed by me and considered in my medical decision making (see chart for details).  On exam, the patient is well-appearing, he is in no acute distress, vital signs are stable.  BP is 157/83, he is mildly hypertensive, but does not exhibit any symptoms at this time.  Patient declines COVID/flu test for his upper respiratory symptoms.  Orthostatic vital signs are negative.  Blood glucose is controlled at this time.  Supportive care recommendations were provided and discussed with the patient to include fluids, rest, over-the-counter cough medication such as Coricidin, and to consider smoking cessation.  Patient was given ER follow-up precautions in the interim, until he is seen by cardiology.  Patient was in agreement with this plan of care and verbalizes understanding.  All questions were answered.  Patient stable for discharge.  Final Clinical Impressions(s) / UC Diagnoses   Final diagnoses:  History of diabetes mellitus  Cough, unspecified type  Elevated blood pressure reading with diagnosis of hypertension     Discharge Instructions      Continue to monitor your blood pressure at home.  Go to the emergency department if  you experience symptoms such as headache, dizziness, chest pain, shortness of breath, or difficulty breathing. Can continue to monitor your blood glucose as discussed.  Please follow-up with your endocrinologist or primary care physician to discuss medications such as Zepbound or Mounjaro to help with weight loss. For your upper respiratory symptoms, you may take over-the-counter cough medication such as Coricidin HBP to help with symptoms. Consider smoking cessation to help  lower your blood pressure. Follow-up with cardiology as scheduled. Follow-up as needed.     ED Prescriptions   None    PDMP not reviewed this encounter.     [1]  Social History Tobacco Use   Smoking status: Former    Current packs/day: 0.00    Average packs/day: 0.3 packs/day for 26.0 years (6.5 ttl pk-yrs)    Types: Cigarettes    Start date: 03/06/1993    Quit date: 03/07/2019    Years since quitting: 5.6    Passive exposure: Never   Smokeless tobacco: Never  Vaping Use   Vaping status: Never Used  Substance Use Topics   Alcohol  use: No   Drug use: No     Gilmer Etta PARAS, NP 10/23/24 1149  "

## 2024-10-24 ENCOUNTER — Ambulatory Visit: Attending: Cardiology | Admitting: Cardiology

## 2024-10-24 ENCOUNTER — Encounter: Payer: Self-pay | Admitting: Cardiology

## 2024-10-24 VITALS — BP 133/73 | HR 75 | Ht 71.0 in | Wt 292.4 lb

## 2024-10-24 DIAGNOSIS — E782 Mixed hyperlipidemia: Secondary | ICD-10-CM | POA: Diagnosis not present

## 2024-10-24 DIAGNOSIS — I4719 Other supraventricular tachycardia: Secondary | ICD-10-CM | POA: Diagnosis not present

## 2024-10-24 DIAGNOSIS — I25119 Atherosclerotic heart disease of native coronary artery with unspecified angina pectoris: Secondary | ICD-10-CM | POA: Diagnosis not present

## 2024-10-24 DIAGNOSIS — I10 Essential (primary) hypertension: Secondary | ICD-10-CM

## 2024-10-24 MED ORDER — NITROGLYCERIN 0.4 MG SL SUBL: 0.4000 mg | SUBLINGUAL_TABLET | SUBLINGUAL | 3 refills | Status: AC | PRN

## 2024-10-24 NOTE — Progress Notes (Signed)
"  ° ° °  Cardiology Office Note  Date: 10/24/2024   ID: Lance [name], DOB [birth year], MRN [id number]  History of Present Illness: Lance [name] is a 49 y.o. male last seen in October 2025.  He is here for a routine visit.  He does not report any definite angina or nitroglycerin  use.  Continues to work full-time in holiday [employer].  He was seen at urgent care yesterday reporting elevated blood pressure and also feeling unusual with possible URI symptoms.  He declined respiratory viral panel.  Feels somewhat better today but tells me that his blood pressure seems to fluctuate.  We went over his medications, discussed cutting his lisinopril  40 mg tablet in half and taking this twice daily instead of once daily.  He otherwise remains on stable cardiac regimen including aspirin  and Brilinta .  Lipitor  is at 80 mg daily, he is due for a follow-up FLP.  I reviewed his ECG from yesterday.  Physical Exam: VS:  BP 133/73 (BP Location: Left Arm, Patient Position: Sitting, Cuff Size: Large)   Pulse 75   Ht 5' 11 (1.803 m)   Wt 292 lb 6.4 oz (132.6 kg)   SpO2 98%   BMI 40.78 kg/m , BMI Body mass index is 40.78 kg/m.  Wt Readings from Last 3 Encounters:  10/24/24 292 lb 6.4 oz (132.6 kg)  08/25/24 291 lb 9.6 oz (132.3 kg)  08/11/24 290 lb (131.5 kg)    General: Patient appears comfortable at rest. HEENT: Conjunctiva and lids normal. Neck: Supple, no elevated JVP or carotid bruits. Lungs: Clear to auscultation, nonlabored breathing at rest. Cardiac: Regular rate and rhythm, no S3 or significant systolic murmur.  ECG:  An ECG dated 10/23/2024 was personally reviewed today and demonstrated:  Sinus rhythm with rightward axis, R' in lead V1.  Labwork: October 2024: Cholesterol 130, triglycerides 191, HDL 24, LDL 73 01/24/2024: B Natriuretic Peptide 19.0 07/17/2024: Pro Brain Natriuretic Peptide <50.0 08/04/2024: ALT 31; AST 25; BUN 14; Creatinine, Ser 0.99; Hemoglobin 14.2; Platelets 231; Potassium 4.6;  Sodium 140   Other Studies Reviewed Today:  No interval cardiac testing for review today.  Assessment and Plan:  1.  CAD status post DES to the mid RCA and OM in October 2018 with documented occlusion of the distal circumflex associated with collaterals.  He underwent subsequent DES to the proximal LAD and angioplasty with stent intervention to the first obtuse marginal and mid circumflex in June 2020.  Most recently underwent cardiac catheterization in April 2024 in the setting of unstable angina and underwent atherectomy to the proximal LAD for treatment of in-stent restenosis.  No definite angina at this time, needs refill for nitroglycerin .  Plan to continue aspirin  81 mg daily, Brilinta  60 mg twice daily, and Lipitor  80 mg daily.   2.  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  No palpitations.   3.  Mixed hyperlipidemia, LDL 73 in October 2024.  Continue Lipitor  80 mg daily.  Follow-up FLP.   4.  Primary hypertension.  Plan to change lisinopril  to 20 mg twice daily instead of 40 mg once daily.  Continue to track blood pressure at home.  Disposition:  Follow up 6 months.  Signed, Jayson JUDITHANN Sierras, M.D., F.A.C.C. [facility]

## 2024-10-24 NOTE — Patient Instructions (Signed)
 Medication Instructions:   Your physician recommends that you continue on your current medications as directed. Please refer to the Current Medication list given to you today.   Labwork: Fasting Lipids at [facility]  Testing/Procedures: None today  Follow-Up: 6 months Dr.McDowell  Any Other Special Instructions Will Be Listed Below (If Applicable).  If you need a refill on your cardiac medications before your next appointment, please call your pharmacy.

## 2024-11-11 ENCOUNTER — Encounter (HOSPITAL_COMMUNITY): Payer: Self-pay | Admitting: Emergency Medicine

## 2024-11-11 ENCOUNTER — Telehealth: Payer: Self-pay | Admitting: *Deleted

## 2024-11-11 ENCOUNTER — Other Ambulatory Visit: Payer: Self-pay

## 2024-11-11 ENCOUNTER — Emergency Department (HOSPITAL_COMMUNITY)
Admission: EM | Admit: 2024-11-11 | Discharge: 2024-11-11 | Disposition: A | Discharge status: Home / Self Care | Source: Home / Self Care | Attending: Emergency Medicine | Admitting: Emergency Medicine

## 2024-11-11 DIAGNOSIS — R0789 Other chest pain: Secondary | ICD-10-CM

## 2024-11-11 DIAGNOSIS — I10 Essential (primary) hypertension: Secondary | ICD-10-CM

## 2024-11-11 LAB — TROPONIN T, HIGH SENSITIVITY: Troponin T High Sensitivity: 17 ng/L (ref 0–19)

## 2024-11-11 MED ORDER — LISINOPRIL 10 MG PO TABS
40.0000 mg | ORAL_TABLET | Freq: Once | ORAL | Status: DC
  Filled 2024-11-11: qty 4

## 2024-11-11 NOTE — ED Provider Notes (Signed)
 " [place] [facility] AT [facility] Provider Note   CSN: [id number] Arrival date & time: 11/11/24  0207     Patient presents with: Hypertension   Lance [name] is a 49 y.o. male.    Hypertension Associated symptoms include chest pain. Pertinent negatives include no shortness of breath.  Patient with extensive history including CAD with stent placement, hypertension, diabetes, ongoing tobacco use presents with elevated blood pressure and mild chest pressure.  Patient reports he woke up and he did not feel right & had mild chest pressure.  No fever, no vomiting, no shortness of breath.  No diaphoresis This occurred around 2 hours ago He checked his blood pressure and it was elevated, 192/93 Reports now he is feeling improved.  He did not take any additional medications. At the direction of his cardiologist, he was taking lisinopril  20 mg twice daily However due to difficulty managing his blood pressure, he is now back to lisinopril  40 mg daily He reports compliance with his aspirin  and Brilinta . He denies any recent chest pain symptoms while working.  He works in holiday representative, remains active without any chest pain but does have occasional shortness of breath.     Prior to Admission medications  Medication Sig Start Date End Date Taking? Authorizing Provider  ALPRAZolam  (XANAX ) 1 MG tablet Take 1 mg by mouth. 10/18/24   [provider]  aspirin  EC 81 MG tablet Take 1 tablet (81 mg total) by mouth daily with breakfast. 04/05/19   Pearlean, Courage, MD  atorvastatin  (LIPITOR ) 80 MG tablet TAKE 1 TABLET BY MOUTH EVERYDAY AT BEDTIME 09/30/24   Debera Jayson MATSU, MD  B-D ULTRAFINE III SHORT PEN 31G X 8 MM MISC USE TO INJECT INSULIN  4 (FOUR) TIMES DAILY. 10/01/23   Nida, Gebreselassie W, MD  blood glucose meter kit and supplies KIT 1 each by Does not apply route 4 (four) times daily. Dispense based on patient and insurance preference. Use up to four times daily as  directed. (FOR ICD-9 250.00, 250.01). 02/14/20   Lenis Ethelle ORN, MD  Continuous Glucose Sensor (DEXCOM G7 SENSOR) MISC CHANGE SENSOR EVERY 10 DAYS 09/12/24   Nida, Gebreselassie W, MD  docusate sodium  (COLACE) 100 MG capsule Take 1 capsule (100 mg total) by mouth 2 (two) times daily. 01/25/24 01/24/25  Maree, Pratik D, DO  famotidine  (PEPCID ) 40 MG tablet Take 40 mg by mouth daily. 04/30/23   [provider]  ferrous sulfate  325 (65 FE) MG EC tablet TAKE 1 TABLET BY MOUTH TWICE A DAY 08/08/24   Ahmed, Deatrice FALCON, MD  insulin  aspart (NOVOLOG  FLEXPEN) 100 UNIT/ML FlexPen INJECT 20-26 UNITS INTO THE SKIN 3 (THREE) TIMES DAILY WITH MEALS. 08/17/24   Nida, Gebreselassie W, MD  insulin  glargine (LANTUS  SOLOSTAR) 100 UNIT/ML Solostar Pen INJECT 80 UNITS INTO THE SKIN AT BEDTIME. 10/17/24   Nida, Gebreselassie W, MD  LANTUS  SOLOSTAR 100 UNIT/ML Solostar Pen Inject 80 Units into the skin at bedtime. 04/17/24   [provider]  lisinopril  (ZESTRIL ) 40 MG tablet TAKE 1 TABLET BY MOUTH EVERY DAY 07/05/24   Debera Jayson MATSU, MD  nitroGLYCERIN  (NITROSTAT ) 0.4 MG SL tablet Place 1 tablet (0.4 mg total) under the tongue every 5 (five) minutes x 3 doses as needed for chest pain. 10/24/24   Debera Jayson MATSU, MD  pantoprazole  (PROTONIX ) 40 MG tablet Take 40 mg by mouth at bedtime. 01/04/24   [provider]  Semaglutide , 2 MG/DOSE, 8 MG/3ML SOPN Inject 2 mg  as directed once a week. 08/25/24   Nida, Gebreselassie W, MD  Sodium Sulfate-Mag Sulfate-KCl (SUTAB ) [phone] MG TABS Take 12 tablets by mouth as directed. Patient not taking: Reported on 10/24/2024 05/13/24   Ahmed, Muhammad F, MD  ticagrelor  (BRILINTA ) 60 MG TABS tablet Take 1 tablet (60 mg total) by mouth 2 (two) times daily. 07/19/24   Debera Jayson MATSU, MD    Allergies: Bee venom and Penicillins    Review of Systems  Constitutional:  Negative for diaphoresis and fever.  Respiratory:  Negative for shortness of breath.    Cardiovascular:  Positive for chest pain. Negative for leg swelling.  Gastrointestinal:  Negative for vomiting.    Updated Vital Signs BP 133/63   Pulse 68   Temp 97.8 F (36.6 C) (Oral)   Resp 12   SpO2 98%   Physical Exam CONSTITUTIONAL: Appears older than stated age HEAD: Normocephalic/atraumatic ENMT: Mucous membranes moist NECK: supple no meningeal signs CV: S1/S2 noted, soft murmur noted LUNGS: Lungs are clear to auscultation bilaterally, no apparent distress ABDOMEN: soft, nontender NEURO: Pt is awake/alert/appropriate, moves all extremitiesx4.  No facial droop.  No arm or leg drift EXTREMITIES: pulses normal/equalx4, full ROM SKIN: warm, color normal PSYCH: no abnormalities of mood noted, alert and oriented to situation  (all labs ordered are listed, but only abnormal results are displayed) Labs Reviewed  TROPONIN T, HIGH SENSITIVITY    EKG: EKG Interpretation Date/Time:  Friday November 11 2024 02:16:59 EST Ventricular Rate:  76 PR Interval:  190 QRS Duration:  100 QT Interval:  394 QTC Calculation: 443 R Axis:   107  Text Interpretation: Sinus rhythm Right axis deviation No significant change since last tracing Confirmed by Midge Golas ([id number]) on 11/11/2024 2:20:09 AM  Radiology: No results found.   Procedures   Medications Ordered in the ED - No data to display   Clinical Course as of 11/11/24 0330  Fri Nov 11, 2024  [id number] Patient presents for concern for his blood pressure. He is having difficulty managing it with his lisinopril  Reports mild chest pressure but all of his symptoms have essentially resolved EKG unchanged.  Will check troponin.  Will also give him his daily dose of lisinopril  He is very well-appearing at this time [DW]  0330 Initial troponin is unremarkable.  Blood pressure is spontaneously improving without lisinopril , will defer for now Patient would like to build to go to work later this morning Overall patient is  well-appearing.  He was mostly concerned about his elevated blood pressure, has not had any significant unstable angina symptoms  Will plan for discharge.  Message has been sent to his cardiologist to help manage his BP as an outpatient, he may need additional agent [DW]    Clinical Course User Index [DW] Midge Golas, MD                                 Medical Decision Making Risk Prescription drug management.   This patient presents to the ED for concern of chest pain, hypertension, this involves an extensive number of treatment options, and is a complaint that carries with it a high risk of complications and morbidity.  The differential diagnosis includes but is not limited to acute coronary syndrome, aortic dissection, pulmonary embolism, pericarditis, pneumothorax, pneumonia, myocarditis, pleurisy, esophageal rupture    Comorbidities that complicate the patient evaluation: Patients presentation is complicated by their history of CAD, DM, Hypertension  Social Determinants of Health: Patients ongoing tobacco use  increases the complexity of managing their presentation  Additional history obtained: Records reviewed cardiology notes reviewed  Lab Tests: I Ordered, and personally interpreted labs.  The pertinent results include: Troponin negative  Test Considered: Given history and exam, low suspicion for ACS at this time He has no signs of acute aortic dissection  Reevaluation: After the interventions noted above, I reevaluated the patient and found that they have :improved  Complexity of problems addressed: Patients presentation is most consistent with  acute presentation with potential threat to life or bodily function  Disposition: After consideration of the diagnostic results and the patients response to treatment,  I feel that the patent would benefit from discharge  .        Final diagnoses:  Primary hypertension  Chest pressure    ED Discharge  Orders          Ordered    Ambulatory referral to Cardiology        11/11/24 0328               Midge Golas, MD 11/11/24 [phone]  "

## 2024-11-11 NOTE — Telephone Encounter (Signed)
 Debera Jayson MATSU, MD  Lenon Jinnie HERO, RN Please contact patient and set up a nurse BP check in a next week.  Would bring in recordings of blood pressure from home.

## 2024-11-11 NOTE — Discharge Instructions (Signed)

## 2024-11-11 NOTE — ED Triage Notes (Signed)
 Pt c/o HTN and a little bit of chest pressure, endorses Hx of HTN and takes BP medication, but hasn't taken it since yesterday morning. Denies shob, denies HA, denies vision issues. States his home BP was 192/93

## 2024-11-11 NOTE — Telephone Encounter (Signed)
 Patient informed and verbalized understanding of plan.

## 2024-11-17 ENCOUNTER — Ambulatory Visit

## 2024-12-15 ENCOUNTER — Inpatient Hospital Stay: Attending: Hematology

## 2024-12-22 ENCOUNTER — Inpatient Hospital Stay: Admitting: Oncology

## 2024-12-27 ENCOUNTER — Inpatient Hospital Stay: Admitting: Oncology

## 2024-12-28 ENCOUNTER — Ambulatory Visit: Admitting: "Endocrinology
